# Patient Record
Sex: Female | Born: 1935 | Race: Black or African American | Hispanic: No | Marital: Married
Health system: Southern US, Community
[De-identification: ages and names within clinical notes are randomized; demographics above are authoritative.]

## PROBLEM LIST (undated history)

## (undated) DIAGNOSIS — I517 Cardiomegaly: Secondary | ICD-10-CM

## (undated) DIAGNOSIS — M5126 Other intervertebral disc displacement, lumbar region: Secondary | ICD-10-CM

## (undated) DIAGNOSIS — H547 Unspecified visual loss: Secondary | ICD-10-CM

## (undated) DIAGNOSIS — E785 Hyperlipidemia, unspecified: Secondary | ICD-10-CM

## (undated) DIAGNOSIS — G8194 Hemiplegia, unspecified affecting left nondominant side: Secondary | ICD-10-CM

## (undated) DIAGNOSIS — E46 Unspecified protein-calorie malnutrition: Secondary | ICD-10-CM

## (undated) DIAGNOSIS — I639 Cerebral infarction, unspecified: Secondary | ICD-10-CM

## (undated) DIAGNOSIS — K521 Toxic gastroenteritis and colitis: Secondary | ICD-10-CM

## (undated) DIAGNOSIS — N6019 Diffuse cystic mastopathy of unspecified breast: Secondary | ICD-10-CM

## (undated) DIAGNOSIS — J45909 Unspecified asthma, uncomplicated: Secondary | ICD-10-CM

## (undated) DIAGNOSIS — R5381 Other malaise: Secondary | ICD-10-CM

## (undated) DIAGNOSIS — T3695XA Adverse effect of unspecified systemic antibiotic, initial encounter: Secondary | ICD-10-CM

## (undated) DIAGNOSIS — R05 Cough: Secondary | ICD-10-CM

## (undated) DIAGNOSIS — R531 Weakness: Secondary | ICD-10-CM

## (undated) DIAGNOSIS — R059 Cough, unspecified: Secondary | ICD-10-CM

## (undated) DIAGNOSIS — I6381 Other cerebral infarction due to occlusion or stenosis of small artery: Secondary | ICD-10-CM

## (undated) DIAGNOSIS — I872 Venous insufficiency (chronic) (peripheral): Secondary | ICD-10-CM

## (undated) HISTORY — DX: Cardiomegaly: I51.7

## (undated) HISTORY — DX: Toxic gastroenteritis and colitis: K52.1

## (undated) HISTORY — PX: COLONOSCOPY: SHX174

## (undated) HISTORY — DX: Adverse effect of unspecified systemic antibiotic, initial encounter: T36.95XA

## (undated) HISTORY — DX: Unspecified asthma, uncomplicated: J45.909

## (undated) HISTORY — DX: Unspecified protein-calorie malnutrition: E46

## (undated) HISTORY — DX: Cerebral infarction, unspecified: I63.9

## (undated) HISTORY — DX: Venous insufficiency (chronic) (peripheral): I87.2

## (undated) HISTORY — DX: Hemiplegia, unspecified affecting left nondominant side: G81.94

## (undated) HISTORY — DX: Hyperlipidemia, unspecified: E78.5

## (undated) HISTORY — DX: Cough, unspecified: R05.9

## (undated) HISTORY — DX: Other malaise: R53.81

## (undated) HISTORY — DX: Diffuse cystic mastopathy of unspecified breast: N60.19

## (undated) HISTORY — DX: Other cerebral infarction due to occlusion or stenosis of small artery: I63.81

## (undated) HISTORY — DX: Unspecified visual loss: H54.7

## (undated) HISTORY — DX: Cough: R05

## (undated) HISTORY — DX: Other intervertebral disc displacement, lumbar region: M51.26

## (undated) HISTORY — DX: Weakness: R53.1

## (undated) HISTORY — PX: CATARACT EXTRACTION: SUR2

---

## 2004-02-06 ENCOUNTER — Encounter: Admission: RE | Admit: 2004-02-06 | Discharge: 2004-02-06 | Payer: Self-pay | Admitting: Family Medicine

## 2005-02-19 ENCOUNTER — Encounter: Admission: RE | Admit: 2005-02-19 | Discharge: 2005-02-19 | Payer: Self-pay | Admitting: Allergy and Immunology

## 2005-04-03 ENCOUNTER — Ambulatory Visit (HOSPITAL_COMMUNITY): Admission: RE | Admit: 2005-04-03 | Discharge: 2005-04-03 | Payer: Self-pay

## 2006-07-07 DIAGNOSIS — I517 Cardiomegaly: Secondary | ICD-10-CM

## 2006-07-07 HISTORY — DX: Cardiomegaly: I51.7

## 2006-07-17 ENCOUNTER — Other Ambulatory Visit: Admission: RE | Admit: 2006-07-17 | Discharge: 2006-07-17 | Payer: Self-pay | Admitting: Family Medicine

## 2006-07-30 ENCOUNTER — Encounter: Admission: RE | Admit: 2006-07-30 | Discharge: 2006-07-30 | Payer: Self-pay | Admitting: Family Medicine

## 2007-04-01 ENCOUNTER — Encounter: Admission: RE | Admit: 2007-04-01 | Discharge: 2007-04-01 | Payer: Self-pay | Admitting: Pediatrics

## 2008-11-18 ENCOUNTER — Inpatient Hospital Stay (HOSPITAL_COMMUNITY): Admission: AD | Admit: 2008-11-18 | Discharge: 2008-11-20 | Payer: Self-pay | Admitting: Internal Medicine

## 2008-11-18 ENCOUNTER — Ambulatory Visit: Payer: Self-pay | Admitting: Diagnostic Radiology

## 2008-11-18 ENCOUNTER — Encounter: Payer: Self-pay | Admitting: Emergency Medicine

## 2008-11-20 ENCOUNTER — Ambulatory Visit: Payer: Self-pay | Admitting: Vascular Surgery

## 2008-11-20 ENCOUNTER — Encounter (INDEPENDENT_AMBULATORY_CARE_PROVIDER_SITE_OTHER): Payer: Self-pay | Admitting: Internal Medicine

## 2009-03-06 ENCOUNTER — Encounter: Admission: RE | Admit: 2009-03-06 | Discharge: 2009-03-06 | Payer: Self-pay | Admitting: Family Medicine

## 2010-07-27 ENCOUNTER — Encounter: Payer: Self-pay | Admitting: Family Medicine

## 2010-10-15 LAB — CARDIAC PANEL(CRET KIN+CKTOT+MB+TROPI)
CK, MB: 2.3 ng/mL (ref 0.3–4.0)
CK, MB: 2.4 ng/mL (ref 0.3–4.0)
Relative Index: 1.8 (ref 0.0–2.5)
Relative Index: 1.7 (ref 0.0–2.5)
Total CK: 126 U/L (ref 7–177)
Total CK: 138 U/L (ref 7–177)
Troponin I: 0.01 ng/mL (ref 0.00–0.06)

## 2010-10-15 LAB — CBC
HCT: 41 % (ref 36.0–46.0)
Hemoglobin: 13 g/dL (ref 12.0–15.0)
Hemoglobin: 13.1 g/dL (ref 12.0–15.0)
MCHC: 32.9 g/dL (ref 30.0–36.0)
MCHC: 33.5 g/dL (ref 30.0–36.0)
MCHC: 34.4 g/dL (ref 30.0–36.0)
MCV: 88.9 fL (ref 78.0–100.0)
MCV: 89.8 fL (ref 78.0–100.0)
Platelets: 188 10*3/uL (ref 150–400)
Platelets: 199 10*3/uL (ref 150–400)
Platelets: 205 10*3/uL (ref 150–400)
Platelets: 197 10*3/uL (ref 150–400)
Platelets: 198 10*3/uL (ref 150–400)
RBC: 4.57 MIL/uL (ref 3.87–5.11)
RBC: 4.7 MIL/uL (ref 3.87–5.11)
RBC: 4.44 MIL/uL (ref 3.87–5.11)
RDW: 15.6 % — ABNORMAL HIGH (ref 11.5–15.5)
RDW: 14.3 % (ref 11.5–15.5)
WBC: 8.5 10*3/uL (ref 4.0–10.5)

## 2010-10-15 LAB — URINALYSIS, ROUTINE W REFLEX MICROSCOPIC
Glucose, UA: NEGATIVE mg/dL
Hgb urine dipstick: NEGATIVE
Protein, ur: NEGATIVE mg/dL
Specific Gravity, Urine: 1.013 (ref 1.005–1.030)

## 2010-10-15 LAB — BASIC METABOLIC PANEL
BUN: 7 mg/dL (ref 6–23)
CO2: 26 mEq/L (ref 19–32)
CO2: 22 mEq/L (ref 19–32)
Calcium: 8.2 mg/dL — ABNORMAL LOW (ref 8.4–10.5)
Calcium: 8.1 mg/dL — ABNORMAL LOW (ref 8.4–10.5)
Chloride: 106 mEq/L (ref 96–112)
Creatinine, Ser: 1.08 mg/dL (ref 0.4–1.2)
GFR calc Af Amer: 58 mL/min — ABNORMAL LOW (ref >60)
GFR calc Af Amer: >60 mL/min (ref >60)
Glucose, Bld: 109 mg/dL — ABNORMAL HIGH (ref 70–99)
Potassium: 4.6 mEq/L (ref 3.5–5.1)
Sodium: 134 mEq/L — ABNORMAL LOW (ref 135–145)

## 2010-10-15 LAB — COMPREHENSIVE METABOLIC PANEL
ALT: 14 U/L (ref 0–35)
ALT: 17 U/L (ref 0–35)
AST: 21 U/L (ref 0–37)
AST: 23 U/L (ref 0–37)
Albumin: 2.9 g/dL — ABNORMAL LOW (ref 3.5–5.2)
Albumin: 3.1 g/dL — ABNORMAL LOW (ref 3.5–5.2)
Alkaline Phosphatase: 57 U/L (ref 39–117)
Alkaline Phosphatase: 77 U/L (ref 39–117)
Calcium: 8.1 mg/dL — ABNORMAL LOW (ref 8.4–10.5)
GFR calc Af Amer: 57 mL/min — ABNORMAL LOW (ref >60)
GFR calc Af Amer: >60 mL/min (ref >60)
Glucose, Bld: 147 mg/dL — ABNORMAL HIGH (ref 70–99)
Potassium: 3.8 mEq/L (ref 3.5–5.1)
Potassium: 3.1 mEq/L — ABNORMAL LOW (ref 3.5–5.1)
Sodium: 140 mEq/L (ref 135–145)
Sodium: 142 mEq/L (ref 135–145)
Total Protein: 6 g/dL (ref 6.0–8.3)
Total Protein: 6.1 g/dL (ref 6.0–8.3)

## 2010-10-15 LAB — IRON AND TIBC
Iron: 40 ug/dL — ABNORMAL LOW (ref 42–135)
Saturation Ratios: 14 % — ABNORMAL LOW (ref 20–55)
TIBC: 278 ug/dL (ref 250–470)
UIBC: 238 ug/dL

## 2010-10-15 LAB — DIFFERENTIAL
Basophils Relative: 1 % (ref 0–1)
Eosinophils Absolute: 0.2 10*3/uL (ref 0.0–0.7)
Eosinophils Relative: 2 % (ref 0–5)
Lymphs Abs: 3.3 10*3/uL (ref 0.7–4.0)
Monocytes Absolute: 1 10*3/uL (ref 0.1–1.0)
Monocytes Relative: 11 % (ref 3–12)
Neutrophils Relative %: 49 % (ref 43–77)

## 2010-10-15 LAB — GLUCOSE, CAPILLARY: Glucose-Capillary: 157 mg/dL — ABNORMAL HIGH (ref 70–99)

## 2010-10-15 LAB — POCT CARDIAC MARKERS
CKMB, poc: <1 ng/mL — ABNORMAL LOW (ref 1.0–8.0)
Myoglobin, poc: 37.4 ng/mL (ref 12–200)
Myoglobin, poc: 49.2 ng/mL (ref 12–200)

## 2010-10-15 LAB — CULTURE, BLOOD (ROUTINE X 2)

## 2010-10-15 LAB — LIPID PANEL
Cholesterol: 151 mg/dL (ref 0–200)
LDL Cholesterol: 95 mg/dL (ref 0–99)
Total CHOL/HDL Ratio: 3.6 RATIO
Triglycerides: 71 mg/dL (ref <150)
VLDL: 14 mg/dL (ref 0–40)

## 2010-10-15 LAB — VITAMIN B12: Vitamin B-12: 599 pg/mL (ref 211–911)

## 2010-10-15 LAB — LACTIC ACID, PLASMA: Lactic Acid, Venous: 1.5 mmol/L (ref 0.5–2.2)

## 2010-10-15 LAB — FERRITIN: Ferritin: 70 ng/mL (ref 10–291)

## 2010-11-06 ENCOUNTER — Encounter: Payer: Self-pay | Admitting: Critical Care Medicine

## 2010-11-07 ENCOUNTER — Ambulatory Visit (INDEPENDENT_AMBULATORY_CARE_PROVIDER_SITE_OTHER): Payer: Medicare Other | Admitting: Critical Care Medicine

## 2010-11-07 ENCOUNTER — Ambulatory Visit (HOSPITAL_BASED_OUTPATIENT_CLINIC_OR_DEPARTMENT_OTHER)
Admission: RE | Admit: 2010-11-07 | Discharge: 2010-11-07 | Disposition: A | Discharge status: Home / Self Care | Payer: Medicare Other | Source: Ambulatory Visit | Attending: Critical Care Medicine | Admitting: Critical Care Medicine

## 2010-11-07 ENCOUNTER — Encounter: Payer: Self-pay | Admitting: Critical Care Medicine

## 2010-11-07 ENCOUNTER — Other Ambulatory Visit: Payer: Self-pay | Admitting: Critical Care Medicine

## 2010-11-07 VITALS — BP 120/70 | HR 62 | Temp 97.9°F | Ht 63.0 in | Wt 176.0 lb

## 2010-11-07 DIAGNOSIS — J45909 Unspecified asthma, uncomplicated: Secondary | ICD-10-CM

## 2010-11-07 DIAGNOSIS — M5126 Other intervertebral disc displacement, lumbar region: Secondary | ICD-10-CM

## 2010-11-07 DIAGNOSIS — I517 Cardiomegaly: Secondary | ICD-10-CM

## 2010-11-07 DIAGNOSIS — E785 Hyperlipidemia, unspecified: Secondary | ICD-10-CM

## 2010-11-07 DIAGNOSIS — K219 Gastro-esophageal reflux disease without esophagitis: Secondary | ICD-10-CM

## 2010-11-07 DIAGNOSIS — J4550 Severe persistent asthma, uncomplicated: Secondary | ICD-10-CM | POA: Insufficient documentation

## 2010-11-07 DIAGNOSIS — H547 Unspecified visual loss: Secondary | ICD-10-CM

## 2010-11-07 DIAGNOSIS — H543 Unqualified visual loss, both eyes: Secondary | ICD-10-CM

## 2010-11-07 DIAGNOSIS — I872 Venous insufficiency (chronic) (peripheral): Secondary | ICD-10-CM

## 2010-11-07 DIAGNOSIS — J309 Allergic rhinitis, unspecified: Secondary | ICD-10-CM | POA: Insufficient documentation

## 2010-11-07 MED ORDER — OMEPRAZOLE 20 MG PO CPDR: 20.0000 mg | DELAYED_RELEASE_CAPSULE | Freq: Every day | ORAL | Status: DC

## 2010-11-07 MED ORDER — BECLOMETHASONE DIPROPIONATE 80 MCG/ACT IN AERS: 2.0000 | INHALATION_SPRAY | Freq: Two times a day (BID) | RESPIRATORY_TRACT | Status: DC

## 2010-11-07 NOTE — Progress Notes (Signed)
Subjective:    Patient ID: Darlene Murray, female    DOB: 12/09/1921, 77 y.o.   MRN: [id number]  Asthma She complains of cough, difficulty breathing, shortness of breath, sputum production and wheezing. There is no chest tightness, frequent throat clearing or hoarse voice. This is a recurrent (Symptoms occur monthly.  On cyclic prednisone monthly.  Flares after weans off ) problem. The current episode started more than 1 year ago (current exacerbation over past one week.). The problem occurs daily (symptoms mainly at night when flares). The problem has been gradually worsening. The cough is productive of sputum (mucus is light yellow to white). Associated symptoms include dyspnea on exertion, orthopnea, postnasal drip and rhinorrhea. Pertinent negatives include no appetite change, chest pain, ear congestion, ear pain, fever, headaches, heartburn, myalgias, nasal congestion, PND, sneezing, sore throat, trouble swallowing or weight loss. Her symptoms are aggravated by pollen and URI. Her symptoms are alleviated by oral steroids and steroid inhaler. She reports moderate improvement on treatment. Risk factors for lung disease include no known risk factors. Her past medical history is significant for asthma. There is no history of bronchiectasis, bronchitis, COPD, emphysema or pneumonia.   77 y.o.AAF Dx asthma since 2007.  Symptoms have been excess mucus, cough and wheezing.   Past Medical History  Diagnosis Date  . Blindness   . Asthmatic bronchitis   . Asthma   . Fibrocystic breast disease   . Allergic rhinitis   . Hyperlipidemia   . Venous insufficiency     lower extremities  . HNP (herniated nucleus pulposus), lumbar     L4-L5  . Left ventricular hypertrophy 2008    mild     Family History  Problem Relation Age of Onset  . Heart attack Sister   . Alzheimer's disease Sister   . Leukemia Mother   . Asthma Son      History   Social History  . Marital Status: Widowed    Spouse  Name: N/A    Number of Children: 3  . Years of Education: N/A   Occupational History  . Retired     Runner, broadcasting/film/video   Social History Main Topics  . Smoking status: Never Smoker   . Smokeless tobacco: Never Used  . Alcohol Use: Yes     wine occasionally  . Drug Use: No  . Sexually Active: Not on file   Other Topics Concern  . Not on file   Social History Narrative  . No narrative on file     No Known Allergies   Outpatient Prescriptions Prior to Visit  Medication Sig Dispense Refill  . albuterol (PROAIR HFA) 108 (90 BASE) MCG/ACT inhaler Inhale 2 puffs into the lungs every 4 (four) hours as needed.        . beclomethasone (QVAR) 80 MCG/ACT inhaler Inhale 1 puff into the lungs 2 (two) times daily.        . montelukast (SINGULAIR) 10 MG tablet Take 10 mg by mouth at bedtime.        Marland Kitchen omeprazole (PRILOSEC) 20 MG capsule Take 20 mg by mouth daily as needed.       . traZODone (DESYREL) 50 MG tablet Take 50 mg by mouth at bedtime as needed.       . triamterene-hydrochlorothiazide (MAXZIDE-25) 37.5-25 MG per tablet 1 tablet in the morning as needed ankle swelling          Review of Systems  Constitutional: Negative for fever, chills, weight loss, diaphoresis, activity change, appetite change,  fatigue and unexpected weight change.  HENT: Positive for rhinorrhea, postnasal drip and sinus pressure. Negative for hearing loss, ear pain, nosebleeds, congestion, sore throat, hoarse voice, facial swelling, sneezing, mouth sores, trouble swallowing, neck pain, neck stiffness, dental problem, voice change, tinnitus and ear discharge.   Eyes: Positive for discharge. Negative for photophobia, itching and visual disturbance.  Respiratory: Positive for cough, sputum production, shortness of breath and wheezing. Negative for apnea, choking, chest tightness and stridor.   Cardiovascular: Positive for dyspnea on exertion. Negative for chest pain, palpitations, leg swelling and PND.  Gastrointestinal:  Negative for heartburn, nausea, vomiting, abdominal pain, constipation, blood in stool and abdominal distention.  Genitourinary: Negative for dysuria, urgency, frequency, hematuria, flank pain, decreased urine volume and difficulty urinating.  Musculoskeletal: Positive for joint swelling. Negative for myalgias, back pain, arthralgias and gait problem.  Skin: Negative for color change, pallor and rash.  Neurological: Negative for dizziness, tremors, seizures, syncope, speech difficulty, weakness, light-headedness, numbness and headaches.  Hematological: Negative for adenopathy. Does not bruise/bleed easily.  Psychiatric/Behavioral: Negative for confusion, sleep disturbance and agitation. The patient is not nervous/anxious.        Objective:   Physical Exam Filed Vitals:   11/07/10 1055  BP: 120/70  Pulse: 62  Temp: 97.9 F (36.6 C)  TempSrc: Oral  Height: 5\' 3"  (1.6 m)  Weight: 176 lb (79.833 kg)  SpO2: 97%    Gen: Pleasant, well-nourished, in no distress,  normal affect  ENT: No lesions,  mouth clear,  oropharynx clear, no postnasal drip  Neck: No JVD, no TMG, no carotid bruits  Lungs: No use of accessory muscles, no dullness to percussion, distant S , poor airflow  Cardiovascular: RRR, heart sounds normal, no murmur or gallops, no peripheral edema  Abdomen: soft and NT, no HSM,  BS normal  Musculoskeletal: No deformities, no cyanosis or clubbing  Neuro: alert, non focal  Skin: Warm, no lesions or rashes     CXR 11/07/10: IMPRESSION: Minimal hyperinflation configuration. No superimposed pneumonia, pulmonary edema, or pleural effusion is seen. No pneumothorax is evident.     Assessment & Plan:   Asthma Moderate persistent asthma,  Steroid dependent, atopic, GERD as ppt factors.  Note HFA technique only at 30% Note CXR normal 5/12 Plan  Use omeprazole daily, take 1/2 hour before breakfast then eat Stop Singulair Follow reflux diet Allergy labs today>>>RAST  ,IgE Increase Qvar to two puff twice daily, work on technique A full pulmonary function study  Return 1 month, I will call with lab/xray results     Updated Medication List Outpatient Encounter Prescriptions as of 11/07/2010  Medication Sig Dispense Refill  . albuterol (PROAIR HFA) 108 (90 BASE) MCG/ACT inhaler Inhale 2 puffs into the lungs every 4 (four) hours as needed.        . beclomethasone (QVAR) 80 MCG/ACT inhaler Inhale 2 puffs into the lungs 2 (two) times daily.  1 Inhaler  6  . omeprazole (PRILOSEC) 20 MG capsule Take 1 capsule (20 mg total) by mouth daily.  30 capsule  6  . predniSONE (DELTASONE) 10 MG tablet Take 20 mg by mouth daily. Tapering by Dr. Clarene Duke - has 2 days left       . traZODone (DESYREL) 50 MG tablet Take 50 mg by mouth at bedtime as needed.       . triamterene-hydrochlorothiazide (MAXZIDE-25) 37.5-25 MG per tablet 1 tablet in the morning as needed ankle swelling       . DISCONTD: beclomethasone (QVAR) 80  MCG/ACT inhaler Inhale 1 puff into the lungs 2 (two) times daily.        Marland Kitchen DISCONTD: montelukast (SINGULAIR) 10 MG tablet Take 10 mg by mouth at bedtime.        Marland Kitchen DISCONTD: omeprazole (PRILOSEC) 20 MG capsule Take 20 mg by mouth daily as needed.

## 2010-11-07 NOTE — Progress Notes (Signed)
Quick Note:  Called, spoke with pt. She was informed of CXR results and recs per PW. She verbalized understanding of this and voiced no further questions at this time. ______

## 2010-11-07 NOTE — Progress Notes (Signed)
Quick Note:  Notify the patient that the Xray is stable and no pneumonia, it does show signs of asthma No change in medications are recommended. Continue current meds as prescribed at last office visit ______

## 2010-11-07 NOTE — Patient Instructions (Signed)
Use omeprazole daily, take 1/2 hour before breakfast then eat Stop Singulair Follow reflux diet Allergy labs today Increase Qvar to two puff twice daily, work on technique Chest xray today A full pulmonary function study will be obtained in main office in [place] Return 1 month, I will call with lab/xray results

## 2010-11-08 LAB — ALLERGY FULL PROFILE
Allergen, D pternoyssinus,d7: <0.35 kU/L (ref <0.35)
Aspergillus fumigatus, IgG: <0.35 kU/L (ref <0.35)
Bahia Grass: <0.35 kU/L (ref <0.35)
Bermuda Grass: <0.35 kU/L (ref <0.35)
Candida Albicans: <0.35 kU/L (ref <0.35)
D. farinae: <0.35 kU/L (ref <0.35)
Elm IgE: <0.35 kU/L (ref <0.35)
G009 Red Top: <0.35 kU/L (ref <0.35)
Oak: <0.35 kU/L (ref <0.35)
Plantain: <0.35 kU/L (ref <0.35)
Sycamore Tree: <0.35 kU/L (ref <0.35)
Timothy Grass: <0.35 kU/L (ref <0.35)

## 2010-11-09 NOTE — Assessment & Plan Note (Addendum)
Moderate persistent asthma,  Steroid dependent, atopic, GERD as ppt factors.  Note HFA technique only at 30% Note CXR normal 5/12 Plan  Use omeprazole daily, take 1/2 hour before breakfast then eat Stop Singulair Follow reflux diet Allergy labs today>>>RAST ,IgE Increase Qvar to two puff twice daily, work on technique A full pulmonary function study  Return 1 month, I will call with lab/xray results

## 2010-11-14 ENCOUNTER — Telehealth: Payer: Self-pay | Admitting: *Deleted

## 2010-11-14 NOTE — Telephone Encounter (Signed)
Received allergy profile done at last ov.  Per Dr. Delford Field, call pt and inform her she does have global allergies but no specific allergies.  Stay on meds as prescribed at last OV and he will discuss this in detail with her at next ov.  Called, spoke with pt.  She was informed of above results and recs per PW.  She verbalized understanding understanding of this and voiced no further questions at this time.

## 2010-11-19 NOTE — H&P (Signed)
Darlene Murray, Darlene Murray        ACCOUNT NO.:  [id number]   MEDICAL RECORD NO.:  [id number]          PATIENT TYPE:  INP   LOCATION:  1231                         [facility]   PHYSICIAN:  Michiel Cowboy, MDDATE OF BIRTH:  1921/12/15   DATE OF ADMISSION:  11/18/2008  DATE OF DISCHARGE:                              HISTORY & PHYSICAL   PRIMARY CARE PHYSICIAN:  Chales Salmon. Abigail Miyamoto, M.D.   CHIEF COMPLAINT:  Presyncope, nausea, vomiting, diarrhea.   Patient is a pleasant 77 year old female with no significant past  medical history except for asthma which is under good control.  She had  a very stressful day yesterday, been outside in the sun all day long,  had poor p.o. intake.  No complaints of chest pain or shortness of  breath.  She went to bed and felt fine this morning.  She woke up and  felt generalized weakness which was severe then developed nausea,  vomiting and diarrhea x1 episode.  She became diaphoretic, although not  febrile.  Still had not any chest pain, shortness of breath.  She felt  dizzy-headed but she cannot tell me if it was the room spinning or she  felt presyncopal.  The patient never lost consciousness.  EMS was  called.  She was brought into the emergency department where her initial  temperature was noted to be 94.1.  She was put on a bear hugger.  Initial blood pressure was up to 163/79.  As time progressed, it started  to drift down to 96/58.  The patient was given IV fluids and blood  pressure came back up to 120s systolic.  She was presumed to have sepsis  of unknown etiology, although chest x-ray was unremarkable as well as  her UA.  She was started on IV Zosyn and transferred to [facility] facility,  [facility] step-down from [facility] ER.  Per family, she never had  any complaints of chest pain, shortness of breath.  The patient also  denies that.  Had no fevers, no chills, was feeling completely fine up  until this morning.  No localized  weakness was noted.   PAST MEDICAL HISTORY:  Only significant for asthma.   SOCIAL HISTORY:  The patient never smoked, does not drink, does not  abuse drugs.  Lives at home with husband.   FAMILY HISTORY:  Noncontributory.   ALLERGIES:  No known drug allergies.   MEDICATIONS:  Albuterol as needed.   PHYSICAL EXAMINATION:  VITALS:  Upon presentation to the ER, blood  pressure was 163/79, pulse 55, respirations 15, saturations 99% on room  air, temperature 94.5.  The patient's current vitals are temperature of  97.5, heart rate 55, respirations 14, saturations 100% 2 liters, blood  pressure on admission 133/89, now down slightly to 105/57.  GENERAL:  The patient is still feeling poorly, although cannot tell me  exactly what bothers her.  The patient appears to be younger than stated  age.  HEENT:  Head nontraumatic.  Dryish mucous membranes.  SKIN:  Fair skin turgor.  Clean, dry and intact.  LUNGS:  Clear to auscultation bilaterally.  No wheezes or  rhonchi  appreciated.  HEART:  Regular rate and rhythm although somewhat slow.  No murmurs  could be appreciated.  ABDOMEN:  Soft, nontender, nondistended.  LOWER EXTREMITIES:  Without clubbing, cyanosis or edema.  NEUROLOGIC:  The patient neurologically appears to be grossly intact.  Moving all 4 extremities.  The patient is too weak to stand up and walk,  which is not her baseline.   LABORATORY:  White blood cell count 8.7, hemoglobin 13.1, sodium 142,  potassium 3.1, creatinine 0.9.  UA negative.  Hemoccult positive.  Chest  x-ray showing low lung volume.  CT scan of the head is no acute changes.  EKG showing left anterior fascicular block, otherwise unremarkable.  No  evidence of ischemia.   ASSESSMENT/PLAN:  1. Hypotension, presyncope.  Wonder if this is secondary to      dehydration and poor p.o. intake yesterday, nausea, vomiting,      diarrhea today.  Other possibilities could be cardiogenic shock.      We will cycle  cardiac enzymes x3, obtain serial EKGs, obtain 2-D      echo.  No evidence of infection at this point but until sepsis      completely ruled out, we will continue IV antibiotics.  Check TSH      level given hypothermia.  Currently, this has resolved.  We will      send D-dimer.  2. Presyncope likely secondary to dehydration but will obtain 2-D echo      and carotid Dopplers given I am not sure if she is having vertigo      or presyncope and had an acute onset of this.  We will obtain MRI      to rule out cerebrovascular accident although this is less likely.  3. Hemoccult-positive stools.  We will record number and color of      stools in the chart, sent stool studies.  She has no exposure to      antibiotics or hospital system.  We will hold off C. diff right now      as has only had one episode of diarrhea which was partly related to      transient hypotension maybe.  We will continue to watch CBC.  4. Low potassium, replaced.  5. Hypothermia, currently resolved.  Etiology not quite clear at this      point.  6. History of asthma.  Continue albuterol as needed.  Currently does      not appear to be in asthmatic exacerbation.  7. Prophylaxis:  Protonix plus SCDs, avoid Lovenox for now as she was      Hemoccult-positive.   CODE STATUS:  Per the patient's wishes as well as her family wishes, she  wished to be DNR/DNI which was put in place.   DISPOSITION:  For now until completely sure the patient is not floridly  septic, we will keep in step-down.  If she does well, likely will  transfer to telemetry.      Michiel Cowboy, MD  Electronically Signed     AVD/MEDQ  D:  11/18/2008  T:  11/18/2008  Job:  [id number]   cc:   Chales Salmon. Abigail Miyamoto, M.D.  Fax: [phone]

## 2010-11-19 NOTE — Discharge Summary (Signed)
Darlene Murray, Darlene Murray        ACCOUNT NO.:  [id number]   MEDICAL RECORD NO.:  [id number]          PATIENT TYPE:  INP   LOCATION:  1415                         FACILITY[facility]   PHYSICIAN:  Michiel Cowboy, MDDATE OF BIRTH:  20-Jan-1922   DATE OF ADMISSION:  11/18/2008  DATE OF DISCHARGE:  11/20/2008                               DISCHARGE SUMMARY   PRIMARY CARE Takahiro Godinho:  Dr. Abigail Miyamoto.   CARDIOLOGIST:  Dr. Verdis Prime.   DISCHARGE DIAGNOSES:  Include:  1. Transient hypertension secondary to mild dehydration, currently      resolved.  2. Gastroesophageal reflux disease.  3. History of asthma.  4. Presyncope, resolved.   STUDIES:  Involved:  1. Chest x-ray on Nov 18, 2008, showing low lung volume without focal      disease.  2. CT scan of the head showing no acute intracranial abnormalities,      numerous punctate, hyperdensities within patient's scalp, may be      debris.  3. MRI of the brain showing no acute infarct, atrophy, mild small      vessel disease-type changes suggesting for atherosclerotic-type      changes involving vertebral artery, small, notable3 on the left.  4. CT angiogram of chest done on Nov 19, 2008, showing acute pulmonary      thromboembolism, mild basal atelectasis with multiple bilateral      pulmonary nodules, none above 1 cm, needs to have a followup CT      image in 3 months.  5. Chest x-ray repeat on Nov 19, 2008, showing left basal subsegmental      atelectasis, otherwise unremarkable.  6. A 2D echo done on Nov 20, 2008, showing normal systolic function      and there is a very mild diastolic abnormality but otherwise      unremarkable.  Mild regurgitation noted on aortic valve.  7. Carotid Dopplers done on Nov 20, 2008, preliminary studies found no      ICA stenosis bilaterally.   HOSPITAL COURSE:  Patient is a pleasant 77 year old female who was  admitted with presyncope and transient hypertension, as well as episode  of diarrhea  after she had a very hard day, most of which was spent in  the sun.  Patient initially was admitted to step down because of  transient hypertension but quickly recovered.  Her blood pressure  normalized with mild IV hydration.  She was feeling much better and by  the time of discharge was back to her baseline.  She had PT/OT  evaluation who recommended  home health followup with PT and a rolling  walker.  Patient has been fairly active at home.  She did have an above-  mentioned workup and a CT scan given elevated D-dimer which was negative  for PE but did show pulmonary nodules which need to be followed up in 3  years.  Patient does not have a history of known cancer.   Her extensive workup was unremarkable.  Most likely the cause for her  presyncope was mild dehydration from her being out in the sun for a  prolonged period of time.  Patient needs to follow up with her primary  care Marquia Costello, as well as with Cardiology as needed.  Of note, her EKG  showed some left fascicular block, her heart rate was in the 50s to 60s.  I would recommend outpatient followup with Dr. Katrinka Blazing and Dr. Abigail Miyamoto.      Michiel Cowboy, MD  Electronically Signed     AVD/MEDQ  D:  11/20/2008  T:  11/20/2008  Job:  [id number]   cc:   Chales Salmon. Abigail Miyamoto, M.D.  Fax: [phone]   Lyn Records, M.D.  Fax: [phone]

## 2010-11-22 ENCOUNTER — Ambulatory Visit (INDEPENDENT_AMBULATORY_CARE_PROVIDER_SITE_OTHER): Payer: Medicare Other | Admitting: Critical Care Medicine

## 2010-11-22 DIAGNOSIS — J45909 Unspecified asthma, uncomplicated: Secondary | ICD-10-CM

## 2010-11-22 LAB — PULMONARY FUNCTION TEST

## 2010-11-22 NOTE — Progress Notes (Signed)
PFT done today. 

## 2010-11-27 ENCOUNTER — Encounter: Payer: Self-pay | Admitting: Critical Care Medicine

## 2010-12-04 ENCOUNTER — Encounter: Payer: Self-pay | Admitting: Critical Care Medicine

## 2010-12-04 ENCOUNTER — Telehealth: Payer: Self-pay | Admitting: Critical Care Medicine

## 2010-12-04 MED ORDER — CEFDINIR 300 MG PO CAPS: 300.0000 mg | ORAL_CAPSULE | Freq: Two times a day (BID) | ORAL | Status: AC

## 2010-12-04 NOTE — Telephone Encounter (Signed)
Call in cefdinir 600mg  daily for 6 days OV if unimproving

## 2010-12-04 NOTE — Telephone Encounter (Signed)
PT CALLED BACK AGAIN. WANTS A RESPONSE TODAY ASAP.

## 2010-12-04 NOTE — Telephone Encounter (Signed)
Spoke with the patient and she is c/o increased SOB, chest congestion, productive cough with yellow phlegm. And chills x 1 day. She denies chest tightness, fever, or head congestion. Pt has not tried anything OTC. Offered the pt an appt but she states she can not make it in and prefers something be called in. Pt last seen on 11-07-10.   Please advise. Carron Curie, CMA No Known Allergies

## 2010-12-04 NOTE — Telephone Encounter (Signed)
Pt aware. rx sent.  Weber Monnier, CMA  

## 2010-12-07 ENCOUNTER — Emergency Department (HOSPITAL_BASED_OUTPATIENT_CLINIC_OR_DEPARTMENT_OTHER)
Admission: EM | Admit: 2010-12-07 | Discharge: 2010-12-07 | Disposition: A | Discharge status: Home / Self Care | Payer: Medicare Other | Attending: Emergency Medicine | Admitting: Emergency Medicine

## 2010-12-07 ENCOUNTER — Emergency Department (INDEPENDENT_AMBULATORY_CARE_PROVIDER_SITE_OTHER): Payer: Medicare Other

## 2010-12-07 DIAGNOSIS — R0602 Shortness of breath: Secondary | ICD-10-CM | POA: Insufficient documentation

## 2010-12-07 DIAGNOSIS — Z79899 Other long term (current) drug therapy: Secondary | ICD-10-CM | POA: Insufficient documentation

## 2010-12-07 DIAGNOSIS — J45909 Unspecified asthma, uncomplicated: Secondary | ICD-10-CM

## 2010-12-07 DIAGNOSIS — J45901 Unspecified asthma with (acute) exacerbation: Secondary | ICD-10-CM | POA: Insufficient documentation

## 2010-12-10 ENCOUNTER — Encounter: Payer: Self-pay | Admitting: Pulmonary Disease

## 2010-12-10 ENCOUNTER — Telehealth: Payer: Self-pay | Admitting: *Deleted

## 2010-12-10 ENCOUNTER — Ambulatory Visit (INDEPENDENT_AMBULATORY_CARE_PROVIDER_SITE_OTHER): Payer: Medicare Other | Admitting: Pulmonary Disease

## 2010-12-10 VITALS — BP 122/62 | HR 75 | Temp 97.7°F | Ht 63.0 in | Wt 175.0 lb

## 2010-12-10 DIAGNOSIS — J45909 Unspecified asthma, uncomplicated: Secondary | ICD-10-CM

## 2010-12-10 MED ORDER — PREDNISONE (PAK) 10 MG PO TABS: 10.0000 mg | ORAL_TABLET | Freq: Every day | ORAL | Status: AC

## 2010-12-10 MED ORDER — AZITHROMYCIN 500 MG PO TABS: ORAL_TABLET | ORAL | Status: AC

## 2010-12-10 NOTE — Progress Notes (Signed)
  Subjective:    Patient ID: Darlene Murray, female    DOB: June 15, 1922, 77 y.o.   MRN: [id number]  HPI 88/F with Moderate persistent asthma, Steroid dependent, atopic, GERD as ppt factors. Note HFA technique only at 30%  Note CXR normal 5/12  On Qvar & SABA, RAST ok. She does have nebuliser at home.  12/10/2010  Post ER visit ER visit for congestion, dyspnea & chest pain >> CXR interstitial thickening Given pred 40 mg x 5ds Took cefdinir x 5 days >> diarrhea, hence stopped.     Review of Systems Pt denies any significant  nasal congestion or excess secretions, fever, chills, sweats, unintended wt loss, pleuritic or exertional cp, orthopnea pnd or leg swelling.  Pt also denies any obvious fluctuation in symptoms with weather or environmental change or other alleviating or aggravating factors.    Pt denies any increase in rescue therapy over baseline, denies waking up needing it or having early am exacerbations or coughing/wheezing/ or dyspnea       Objective:   Physical Exam Gen. Pleasant, well-nourished, in no distress ENT - no lesions, no post nasal drip Neck: No JVD, no thyromegaly, no carotid bruits Lungs: no use of accessory muscles, no dullness to percussion, clear without rales or rhonchi  Cardiovascular: Rhythm regular, heart sounds  normal, no murmurs or gallops, no peripheral edema Musculoskeletal: No deformities, no cyanosis or clubbing          Assessment & Plan:

## 2010-12-10 NOTE — Telephone Encounter (Signed)
Spoke with pharmacy and advised per Dr. Vassie Loll 10mg  6 day dose.

## 2010-12-10 NOTE — Assessment & Plan Note (Signed)
Episode of asthmatic bronchitis requiring ER visit. Pred dosepak  Z-pak for atypicals Call if no better, reviewed CXR

## 2010-12-10 NOTE — Telephone Encounter (Signed)
Received message from [place] at [facility] requesting clarification of directions for pt's Prednisone rx. They would like to know if it is for a decreasing 6 day pack? Please advise of directions.

## 2010-12-10 NOTE — Patient Instructions (Signed)
Z-pak (antibiotic) x 5 days Prednisone dosepak -60 mg -taper as directed Call if no better by Friday

## 2010-12-23 ENCOUNTER — Ambulatory Visit (INDEPENDENT_AMBULATORY_CARE_PROVIDER_SITE_OTHER): Payer: Medicare Other | Admitting: Critical Care Medicine

## 2010-12-23 ENCOUNTER — Encounter: Payer: Self-pay | Admitting: Critical Care Medicine

## 2010-12-23 DIAGNOSIS — T3695XA Adverse effect of unspecified systemic antibiotic, initial encounter: Secondary | ICD-10-CM

## 2010-12-23 DIAGNOSIS — R197 Diarrhea, unspecified: Secondary | ICD-10-CM

## 2010-12-23 DIAGNOSIS — J45909 Unspecified asthma, uncomplicated: Secondary | ICD-10-CM

## 2010-12-23 DIAGNOSIS — K521 Toxic gastroenteritis and colitis: Secondary | ICD-10-CM

## 2010-12-23 NOTE — Progress Notes (Signed)
Subjective:    Patient ID: Darlene Murray, female    DOB: July 30, 1921, 77 y.o.   MRN: [id number]  HPI  77 y.o. AAF with Moderate persistent asthma, Steroid dependent, atopic, GERD as ppt factors. Note HFA technique only at 30%  Note CXR normal 5/12  On Qvar & SABA, RAST ok. She does have nebuliser at home.  12/10/10  Post ER visit ER visit for congestion, dyspnea & chest pain >> CXR interstitial thickening Given pred 40 mg x 5ds Took cefdinir x 5 days >> diarrhea, hence stopped.  12/23/2010 Saw RA and rx zpak after omniceph and pt got diarrhea.  HAd gone to ED.  No diarrhea now. No real cough.  Now no dyspnea.  No rescue inhaler use .   Past Medical History  Diagnosis Date  . Blindness   . Asthmatic bronchitis   . Asthma   . Fibrocystic breast disease   . Allergic rhinitis   . Hyperlipidemia   . Venous insufficiency     lower extremities  . HNP (herniated nucleus pulposus), lumbar     L4-L5  . Left ventricular hypertrophy 2008    mild  . Antibiotic-associated diarrhea      Family History  Problem Relation Age of Onset  . Heart attack Sister   . Alzheimer's disease Sister   . Leukemia Mother   . Asthma Son      History   Social History  . Marital Status: Married    Spouse Name: N/A    Number of Children: 3  . Years of Education: N/A   Occupational History  . Retired     Runner, broadcasting/film/video   Social History Main Topics  . Smoking status: Never Smoker   . Smokeless tobacco: Never Used  . Alcohol Use: Yes     wine occasionally  . Drug Use: No  . Sexually Active: Not on file   Other Topics Concern  . Not on file   Social History Narrative  . No narrative on file     No Known Allergies   Outpatient Prescriptions Prior to Visit  Medication Sig Dispense Refill  . albuterol (PROAIR HFA) 108 (90 BASE) MCG/ACT inhaler Inhale 2 puffs into the lungs every 4 (four) hours as needed.        Marland Kitchen azelastine (ASTEPRO) 137 MCG/SPRAY nasal spray Place 1 spray into the nose 2 (two)  times daily as needed. Use in each nostril as directed       . beclomethasone (QVAR) 80 MCG/ACT inhaler Inhale 2 puffs into the lungs 2 (two) times daily.  1 Inhaler  6  . calcium carbonate (OS-CAL) 600 MG TABS Take 600 mg by mouth daily.        . Cholecalciferol (VITAMIN D3) 1000 UNITS CAPS Take 1 capsule by mouth daily.        . Guaifenesin (MUCINEX MAXIMUM STRENGTH) 1200 MG TB12 Take 1 tablet by mouth 2 (two) times daily.        . Multiple Vitamin (MULTIVITAMIN) capsule Take 1 capsule by mouth daily.        Marland Kitchen omeprazole (PRILOSEC) 20 MG capsule Take 1 capsule (20 mg total) by mouth daily.  30 capsule  6  . traZODone (DESYREL) 50 MG tablet Take 50 mg by mouth at bedtime as needed.       . triamterene-hydrochlorothiazide (MAXZIDE-25) 37.5-25 MG per tablet 1 tablet in the morning as needed ankle swelling       . predniSONE (DELTASONE) 10 MG tablet Take 20  mg by mouth daily. Tapering by Dr. Clarene Duke - has 2 days left            Review of Systems  Pt denies any significant  nasal congestion or excess secretions, fever, chills, sweats, unintended wt loss, pleuritic or exertional cp, orthopnea pnd or leg swelling.  Pt also denies any obvious fluctuation in symptoms with weather or environmental change or other alleviating or aggravating factors.    Pt denies any increase in rescue therapy over baseline, denies waking up needing it or having early am exacerbations or coughing/wheezing/ or dyspnea       Objective:   Physical Exam  Gen. Pleasant, well-nourished, in no distress ENT - no lesions, no post nasal drip Neck: No JVD, no thyromegaly, no carotid bruits Lungs: no use of accessory muscles, no dullness to percussion, clear without rales or rhonchi  Cardiovascular: Rhythm regular, heart sounds  normal, no murmurs or gallops, no peripheral edema Musculoskeletal: No deformities, no cyanosis or clubbing          Assessment & Plan:   Asthma with allergic rhinitis Moderate persistent  asthma HFA technique only 30% up to 50% after retraining 5/12>>>only 50% with more training 12/23/10 PFTs 5/18: FeV1 94%  Fef 25 75 55%   >>>66% post BD Recent exacerbation in part due to poor HFA technique. Pt has aerochamber and will use with HFA device Plan Use aerochamber No further ABX No further pred No change in inhaled or maintenance medications. Return in  2 months     Updated Medication List Outpatient Encounter Prescriptions as of 12/23/2010  Medication Sig Dispense Refill  . albuterol (PROAIR HFA) 108 (90 BASE) MCG/ACT inhaler Inhale 2 puffs into the lungs every 4 (four) hours as needed.        Marland Kitchen azelastine (ASTEPRO) 137 MCG/SPRAY nasal spray Place 1 spray into the nose 2 (two) times daily as needed. Use in each nostril as directed       . beclomethasone (QVAR) 80 MCG/ACT inhaler Inhale 2 puffs into the lungs 2 (two) times daily.  1 Inhaler  6  . calcium carbonate (OS-CAL) 600 MG TABS Take 600 mg by mouth daily.        . Cholecalciferol (VITAMIN D3) 1000 UNITS CAPS Take 1 capsule by mouth daily.        . Guaifenesin (MUCINEX MAXIMUM STRENGTH) 1200 MG TB12 Take 1 tablet by mouth 2 (two) times daily.        . Multiple Vitamin (MULTIVITAMIN) capsule Take 1 capsule by mouth daily.        Marland Kitchen omeprazole (PRILOSEC) 20 MG capsule Take 1 capsule (20 mg total) by mouth daily.  30 capsule  6  . traZODone (DESYREL) 50 MG tablet Take 50 mg by mouth at bedtime as needed.       . triamterene-hydrochlorothiazide (MAXZIDE-25) 37.5-25 MG per tablet 1 tablet in the morning as needed ankle swelling       . DISCONTD: predniSONE (DELTASONE) 10 MG tablet Take 20 mg by mouth daily. Tapering by Dr. Clarene Duke - has 2 days left

## 2010-12-23 NOTE — Assessment & Plan Note (Signed)
Moderate persistent asthma HFA technique only 30% up to 50% after retraining 5/12>>>only 50% with more training 12/23/10 PFTs 5/18: FeV1 94%  Fef 25 75 55%   >>>66% post BD Recent exacerbation in part due to poor HFA technique. Pt has aerochamber and will use with HFA device Plan Use aerochamber No further ABX No further pred No change in inhaled or maintenance medications. Return in  2 months

## 2010-12-23 NOTE — Patient Instructions (Signed)
No change in medications. Return in        2 months [place] Work on your inhaler technique and use Aerochamber with Qvar and Proventil

## 2010-12-26 ENCOUNTER — Ambulatory Visit: Payer: PRIVATE HEALTH INSURANCE | Admitting: Critical Care Medicine

## 2011-01-02 ENCOUNTER — Ambulatory Visit: Payer: PRIVATE HEALTH INSURANCE | Admitting: Critical Care Medicine

## 2011-01-27 ENCOUNTER — Other Ambulatory Visit: Payer: Self-pay | Admitting: *Deleted

## 2011-01-27 DIAGNOSIS — K219 Gastro-esophageal reflux disease without esophagitis: Secondary | ICD-10-CM

## 2011-01-27 MED ORDER — OMEPRAZOLE 20 MG PO CPDR: 20.0000 mg | DELAYED_RELEASE_CAPSULE | Freq: Every day | ORAL | Status: DC

## 2011-02-13 ENCOUNTER — Ambulatory Visit: Payer: Medicare Other | Admitting: Critical Care Medicine

## 2011-02-13 ENCOUNTER — Encounter: Payer: Self-pay | Admitting: Critical Care Medicine

## 2011-02-13 ENCOUNTER — Ambulatory Visit (INDEPENDENT_AMBULATORY_CARE_PROVIDER_SITE_OTHER): Payer: Medicare Other | Admitting: Critical Care Medicine

## 2011-02-13 DIAGNOSIS — J45909 Unspecified asthma, uncomplicated: Secondary | ICD-10-CM

## 2011-02-13 MED ORDER — BUDESONIDE-FORMOTEROL FUMARATE 160-4.5 MCG/ACT IN AERO: 2.0000 | INHALATION_SPRAY | Freq: Two times a day (BID) | RESPIRATORY_TRACT | Status: DC

## 2011-02-13 NOTE — Patient Instructions (Signed)
Increase symbicort to two puff twice daily No other medication changes Return 3 months, call if you worsen sooner

## 2011-02-13 NOTE — Progress Notes (Signed)
Subjective:    Patient ID: Darlene Murray, female    DOB: [birth year], 77 y.o.   MRN: [id number]  HPI  77 y.o. AAF with Moderate persistent asthma, Steroid dependent, atopic, GERD as ppt factors. Note HFA technique only at 30%  Note CXR normal 5/12  On Qvar & SABA, RAST ok. She does have nebuliser at home.  12/10/10  Post ER visit ER visit for congestion, dyspnea & chest pain >> CXR interstitial thickening Given pred 40 mg x 5ds Took cefdinir x 5 days >> diarrhea, hence stopped.  12/23/10 Saw RA and rx zpak after omniceph and pt got diarrhea.  HAd gone to ED.  No diarrhea now. No real cough.  Now no dyspnea.  No rescue inhaler use .  8/9 Last few weeks dyspnea.  Now on symbicort since 01/22/11 No cough now,  No saba use.   No sinus issues.  Past Medical History  Diagnosis Date  . Blindness   . Asthmatic bronchitis   . Asthma   . Fibrocystic breast disease   . Allergic rhinitis   . Hyperlipidemia   . Venous insufficiency     lower extremities  . HNP (herniated nucleus pulposus), lumbar     L4-L5  . Left ventricular hypertrophy 2008    mild  . Antibiotic-associated diarrhea      Family History  Problem Relation Age of Onset  . Heart attack Sister   . Alzheimer's disease Sister   . Leukemia Mother   . Asthma Son      History   Social History  . Marital Status: Married    Spouse Name: N/A    Number of Children: 3  . Years of Education: N/A   Occupational History  . Retired     Runner, broadcasting/film/video   Social History Main Topics  . Smoking status: Never Smoker   . Smokeless tobacco: Never Used  . Alcohol Use: Yes     wine occasionally  . Drug Use: No  . Sexually Active: Not on file   Other Topics Concern  . Not on file   Social History Narrative  . No narrative on file     No Known Allergies   Outpatient Prescriptions Prior to Visit  Medication Sig Dispense Refill  . albuterol (PROAIR HFA) 108 (90 BASE) MCG/ACT inhaler Inhale 2 puffs into the lungs every 4 (four)  hours as needed.        . calcium carbonate (OS-CAL) 600 MG TABS Take 600 mg by mouth daily.        . Cholecalciferol (VITAMIN D3) 1000 UNITS CAPS Take 1 capsule by mouth daily.        . Multiple Vitamin (MULTIVITAMIN) capsule Take 1 capsule by mouth daily.        Marland Kitchen omeprazole (PRILOSEC) 20 MG capsule Take 1 capsule (20 mg total) by mouth daily.  90 capsule  3  . traZODone (DESYREL) 50 MG tablet Take 50 mg by mouth at bedtime as needed.       . triamterene-hydrochlorothiazide (MAXZIDE-25) 37.5-25 MG per tablet 1 tablet in the morning as needed ankle swelling       . azelastine (ASTEPRO) 137 MCG/SPRAY nasal spray Place 1 spray into the nose 2 (two) times daily as needed. Use in each nostril as directed       . beclomethasone (QVAR) 80 MCG/ACT inhaler Inhale 2 puffs into the lungs 2 (two) times daily.  1 Inhaler  6  . Guaifenesin (MUCINEX MAXIMUM STRENGTH) 1200 MG TB12 Take  1 tablet by mouth 2 (two) times daily.             Review of Systems  Pt denies any significant  nasal congestion or excess secretions, fever, chills, sweats, unintended wt loss, pleuritic or exertional cp, orthopnea pnd or leg swelling.  Pt also denies any obvious fluctuation in symptoms with weather or environmental change or other alleviating or aggravating factors.    Pt denies any increase in rescue therapy over baseline, denies waking up needing it or having early am exacerbations or coughing/wheezing/ or dyspnea       Objective:   Physical Exam  Gen. Pleasant, well-nourished, in no distress ENT - no lesions, no post nasal drip Neck: No JVD, no thyromegaly, no carotid bruits Lungs: no use of accessory muscles, no dullness to percussion, clear without rales or rhonchi  Cardiovascular: Rhythm regular, heart sounds  normal, no murmurs or gallops, no peripheral edema Musculoskeletal: No deformities, no cyanosis or clubbing          Assessment & Plan:   Asthma with allergic rhinitis Stable asthma , mild  atopic features Plan Increase symbicort to two puff bid      Updated Medication List Outpatient Encounter Prescriptions as of 02/13/2011  Medication Sig Dispense Refill  . albuterol (PROAIR HFA) 108 (90 BASE) MCG/ACT inhaler Inhale 2 puffs into the lungs every 4 (four) hours as needed.        . budesonide-formoterol (SYMBICORT) 160-4.5 MCG/ACT inhaler Inhale 2 puffs into the lungs 2 (two) times daily.  1 Inhaler    . calcium carbonate (OS-CAL) 600 MG TABS Take 600 mg by mouth daily.        . Cholecalciferol (VITAMIN D3) 1000 UNITS CAPS Take 1 capsule by mouth daily.        . Multiple Vitamin (MULTIVITAMIN) capsule Take 1 capsule by mouth daily.        Marland Kitchen omeprazole (PRILOSEC) 20 MG capsule Take 1 capsule (20 mg total) by mouth daily.  90 capsule  3  . traZODone (DESYREL) 50 MG tablet Take 50 mg by mouth at bedtime as needed.       . triamterene-hydrochlorothiazide (MAXZIDE-25) 37.5-25 MG per tablet 1 tablet in the morning as needed ankle swelling       . DISCONTD: budesonide-formoterol (SYMBICORT) 160-4.5 MCG/ACT inhaler Inhale 1 puff into the lungs 2 (two) times daily.        Marland Kitchen DISCONTD: azelastine (ASTEPRO) 137 MCG/SPRAY nasal spray Place 1 spray into the nose 2 (two) times daily as needed. Use in each nostril as directed       . DISCONTD: beclomethasone (QVAR) 80 MCG/ACT inhaler Inhale 2 puffs into the lungs 2 (two) times daily.  1 Inhaler  6  . DISCONTD: Guaifenesin (MUCINEX MAXIMUM STRENGTH) 1200 MG TB12 Take 1 tablet by mouth 2 (two) times daily.

## 2011-02-14 NOTE — Assessment & Plan Note (Addendum)
Stable asthma , mild atopic features Plan Increase symbicort to two puff bid

## 2011-03-05 ENCOUNTER — Encounter: Payer: Self-pay | Admitting: Critical Care Medicine

## 2011-03-05 ENCOUNTER — Ambulatory Visit (INDEPENDENT_AMBULATORY_CARE_PROVIDER_SITE_OTHER): Payer: Medicare Other | Admitting: Critical Care Medicine

## 2011-03-05 VITALS — BP 146/80 | HR 74 | Temp 98.1°F | Ht 63.0 in | Wt 175.8 lb

## 2011-03-05 DIAGNOSIS — J45909 Unspecified asthma, uncomplicated: Secondary | ICD-10-CM

## 2011-03-05 MED ORDER — PREDNISONE 10 MG PO TABS: ORAL_TABLET | ORAL | Status: DC

## 2011-03-05 MED ORDER — FORMOTEROL FUMARATE 20 MCG/2ML IN NEBU: 20.0000 ug | INHALATION_SOLUTION | Freq: Two times a day (BID) | RESPIRATORY_TRACT | Status: DC

## 2011-03-05 MED ORDER — BUDESONIDE 0.25 MG/2ML IN SUSP: 0.2500 mg | Freq: Every day | RESPIRATORY_TRACT | Status: DC

## 2011-03-05 NOTE — Progress Notes (Signed)
Subjective:    Patient ID: Darlene Murray, female    DOB: Aug 19, 1921, 77 y.o.   MRN: [id number]  HPI  77 y.o. AAF with Moderate persistent asthma, Steroid dependent, atopic, GERD as ppt factors. Note HFA technique only at 30%  Note CXR normal 5/12  On Qvar & SABA, RAST ok. She does have nebuliser at home.  12/10/10  Post ER visit ER visit for congestion, dyspnea & chest pain >> CXR interstitial thickening Given pred 40 mg x 5ds Took cefdinir x 5 days >> diarrhea, hence stopped.  12/23/10 Saw RA and rx zpak after omniceph and pt got diarrhea.  HAd gone to ED.  No diarrhea now. No real cough.  Now no dyspnea.  No rescue inhaler use .  8/9 Last few weeks dyspnea.  Now on symbicort since 01/22/11 No cough now,  No saba use.   No sinus issues.   03/05/11 Notes more yellow mucus.  More dyspnea, no chest pain.  Notes some pn drip and nasal congestion.  No GERD symptoms Asthma History:   Symptoms  0-2 days/week  Daily       Nighttime Awakenings  0-2/month  0-2/month       Asthma interference with normal activity  No limitations  Some limitations       SABA use (not for EIB)  0-2 days/wk  Daily       Risk: Exacerbations requiring oral systemic steroids  0-1 / year  0-1 / year          Past Medical History  Diagnosis Date  . Blindness   . Asthmatic bronchitis   . Asthma   . Fibrocystic breast disease   . Allergic rhinitis   . Hyperlipidemia   . Venous insufficiency     lower extremities  . HNP (herniated nucleus pulposus), lumbar     L4-L5  . Left ventricular hypertrophy 2008    mild  . Antibiotic-associated diarrhea      Family History  Problem Relation Age of Onset  . Heart attack Sister   . Alzheimer's disease Sister   . Leukemia Mother   . Asthma Son      History   Social History  . Marital Status: Married    Spouse Name: N/A    Number of Children: 3  . Years of Education: N/A   Occupational History  . Retired     Runner, broadcasting/film/video   Social History Main Topics  .  Smoking status: Never Smoker   . Smokeless tobacco: Never Used  . Alcohol Use: Yes     wine occasionally  . Drug Use: No  . Sexually Active: Yes -- Female partner(s)   Other Topics Concern  . Not on file   Social History Narrative  . No narrative on file     No Known Allergies   Outpatient Prescriptions Prior to Visit  Medication Sig Dispense Refill  . albuterol (PROAIR HFA) 108 (90 BASE) MCG/ACT inhaler Inhale 2 puffs into the lungs every 4 (four) hours as needed.        . calcium carbonate (OS-CAL) 600 MG TABS Take 600 mg by mouth daily.        . Cholecalciferol (VITAMIN D3) 1000 UNITS CAPS Take 1 capsule by mouth daily.        . Multiple Vitamin (MULTIVITAMIN) capsule Take 1 capsule by mouth daily.        Marland Kitchen omeprazole (PRILOSEC) 20 MG capsule Take 1 capsule (20 mg total) by mouth daily.  90  capsule  3  . traZODone (DESYREL) 50 MG tablet Take 50 mg by mouth at bedtime as needed.       . triamterene-hydrochlorothiazide (MAXZIDE-25) 37.5-25 MG per tablet 1 tablet in the morning as needed ankle swelling       . budesonide-formoterol (SYMBICORT) 160-4.5 MCG/ACT inhaler Inhale 2 puffs into the lungs 2 (two) times daily.  1 Inhaler         Review of Systems  Pt denies any significant  nasal congestion or excess secretions, fever, chills, sweats, unintended wt loss, pleuritic or exertional cp, orthopnea pnd or leg swelling.  Pt also denies any obvious fluctuation in symptoms with weather or environmental change or other alleviating or aggravating factors.    Pt denies any increase in rescue therapy over baseline, denies waking up needing it or having early am exacerbations or coughing/wheezing/ or dyspnea       Objective:   Physical Exam  Gen. Pleasant, well-nourished, in no distress ENT - no lesions, no post nasal drip Neck: No JVD, no thyromegaly, no carotid bruits Lungs: no use of accessory muscles, no dullness to percussion, clear without rales or rhonchi  Cardiovascular:  Rhythm regular, heart sounds  normal, no murmurs or gallops, no peripheral edema Musculoskeletal: No deformities, no cyanosis or clubbing          Assessment & Plan:   Asthma with allergic rhinitis Moderate persistent asthma HFA technique only 30% up to 50% after retraining 5/12>>>only 50% with more training 12/23/10>>>75% 02/12/11 >>>20% 03/05/11 D/C HFAs 03/05/11, start BD Neb Meds d/t poor HFA technique PFTs 5/18: FeV1 94%  Fef 25 75 55%   >>>66% post BD Poor HFA technique leads to asthma flare Plan D/c hfa inhalers Start perforomist/budesonide bid in neb Pulse prednisone       Updated Medication List Outpatient Encounter Prescriptions as of 03/05/2011  Medication Sig Dispense Refill  . albuterol (PROAIR HFA) 108 (90 BASE) MCG/ACT inhaler Inhale 2 puffs into the lungs every 4 (four) hours as needed.        . calcium carbonate (OS-CAL) 600 MG TABS Take 600 mg by mouth daily.        . Cholecalciferol (VITAMIN D3) 1000 UNITS CAPS Take 1 capsule by mouth daily.        . Multiple Vitamin (MULTIVITAMIN) capsule Take 1 capsule by mouth daily.        Marland Kitchen omeprazole (PRILOSEC) 20 MG capsule Take 1 capsule (20 mg total) by mouth daily.  90 capsule  3  . traZODone (DESYREL) 50 MG tablet Take 50 mg by mouth at bedtime as needed.       . triamterene-hydrochlorothiazide (MAXZIDE-25) 37.5-25 MG per tablet 1 tablet in the morning as needed ankle swelling       . DISCONTD: budesonide-formoterol (SYMBICORT) 160-4.5 MCG/ACT inhaler Inhale 2 puffs into the lungs 2 (two) times daily.  1 Inhaler    . budesonide (PULMICORT) 0.25 MG/2ML nebulizer solution Take 2 mLs (0.25 mg total) by nebulization daily.  60 mL  12  . formoterol (PERFOROMIST) 20 MCG/2ML nebulizer solution Take 2 mLs (20 mcg total) by nebulization 2 (two) times daily.  120 mL  6  . predniSONE (DELTASONE) 10 MG tablet Take 4 for three days, 3 for three days, 2 for three days, 1 for three days and stop   30 tablet  0

## 2011-03-05 NOTE — Patient Instructions (Signed)
Stop Symbicort Start perforomist one in nebulizer twice daily Start budesonide (pulmicort) one in nebulizer twice daily Take prednisone 10mg  Take 4 for three days, 3 for three days, 2 for three days, 1 for three days and stop Return 2 weeks for recheck in [facility]

## 2011-03-06 NOTE — Assessment & Plan Note (Addendum)
Moderate persistent asthma HFA technique only 30% up to 50% after retraining 5/12>>>only 50% with more training 12/23/10>>>75% 02/12/11 >>>20% 03/05/11 D/C HFAs 03/05/11, start BD Neb Meds d/t poor HFA technique PFTs 5/18: FeV1 94%  Fef 25 75 55%   >>>66% post BD Poor HFA technique leads to asthma flare Plan D/c hfa inhalers Start perforomist/budesonide bid in neb Pulse prednisone

## 2011-03-20 ENCOUNTER — Encounter: Payer: Self-pay | Admitting: Critical Care Medicine

## 2011-03-20 ENCOUNTER — Ambulatory Visit (INDEPENDENT_AMBULATORY_CARE_PROVIDER_SITE_OTHER): Payer: Medicare Other | Admitting: Critical Care Medicine

## 2011-03-20 VITALS — BP 126/70 | HR 87 | Temp 98.0°F | Ht 63.0 in | Wt 177.0 lb

## 2011-03-20 DIAGNOSIS — Z23 Encounter for immunization: Secondary | ICD-10-CM

## 2011-03-20 DIAGNOSIS — J45909 Unspecified asthma, uncomplicated: Secondary | ICD-10-CM

## 2011-03-20 MED ORDER — BUDESONIDE 0.25 MG/2ML IN SUSP: 0.2500 mg | Freq: Two times a day (BID) | RESPIRATORY_TRACT | Status: DC

## 2011-03-20 MED ORDER — PREDNISONE 10 MG PO TABS: ORAL_TABLET | ORAL | Status: DC

## 2011-03-20 NOTE — Patient Instructions (Signed)
Take prednisone 10mg  Take 4 for two days three for two days two for two days one for two days  Increase budesonide to twice daily Stay on perforomist twice daily Return one month Flu vaccine today

## 2011-03-20 NOTE — Progress Notes (Signed)
Subjective:    Patient ID: Darlene Murray, female    DOB: March 05, 1922, 77 y.o.   MRN: [id number]  HPI  77 y.o. AAF with Moderate persistent asthma, Steroid dependent, atopic, GERD as ppt factors. Note HFA technique only at 30%  Note CXR normal 5/12  On Qvar & SABA, RAST ok. She does have nebuliser at home.  12/10/10  Post ER visit ER visit for congestion, dyspnea & chest pain >> CXR interstitial thickening Given pred 40 mg x 5ds Took cefdinir x 5 days >> diarrhea, hence stopped.  12/23/10 Saw RA and rx zpak after omniceph and pt got diarrhea.  HAd gone to ED.  No diarrhea now. No real cough.  Now no dyspnea.  No rescue inhaler use .  8/9 Last few weeks dyspnea.  Now on symbicort since 01/22/11 No cough now,  No saba use.   No sinus issues.   03/05/11 Notes more yellow mucus.  More dyspnea, no chest pain.  Notes some pn drip and nasal congestion.  No GERD symptoms Asthma History:   Symptoms  0-2 days/week  Daily       Nighttime Awakenings  0-2/month  0-2/month       Asthma interference with normal activity  No limitations  Some limitations       SABA use (not for EIB)  0-2 days/wk  Daily       Risk: Exacerbations requiring oral systemic steroids  0-1 / year  0-1 / year        03/20/2011  Only taking budesonide daily.  Feeling better overall Pt denies any significant sore throat, nasal congestion or excess secretions, fever, chills, sweats, unintended weight loss, pleurtic or exertional chest pain, orthopnea PND, or leg swelling Pt denies any increase in rescue therapy over baseline, denies waking up needing it or having any early am or nocturnal exacerbations of coughing/wheezing/or dyspnea. Pt also denies any obvious fluctuation in symptoms with  weather or environmental change or other alleviating or aggravating factors   Past Medical History  Diagnosis Date  . Blindness   . Asthmatic bronchitis   . Asthma   . Fibrocystic breast disease   . Allergic rhinitis   . Hyperlipidemia    . Venous insufficiency     lower extremities  . HNP (herniated nucleus pulposus), lumbar     L4-L5  . Left ventricular hypertrophy 2008    mild  . Antibiotic-associated diarrhea      Family History  Problem Relation Age of Onset  . Heart attack Sister   . Alzheimer's disease Sister   . Leukemia Mother   . Asthma Son      History   Social History  . Marital Status: Married    Spouse Name: N/A    Number of Children: 3  . Years of Education: N/A   Occupational History  . Retired     Runner, broadcasting/film/video   Social History Main Topics  . Smoking status: Never Smoker   . Smokeless tobacco: Never Used  . Alcohol Use: Yes     wine occasionally  . Drug Use: No  . Sexually Active: Yes -- Female partner(s)   Other Topics Concern  . Not on file   Social History Narrative  . No narrative on file     No Known Allergies   Outpatient Prescriptions Prior to Visit  Medication Sig Dispense Refill  . albuterol (PROAIR HFA) 108 (90 BASE) MCG/ACT inhaler Inhale 2 puffs into the lungs every 4 (four) hours as needed.        Marland Kitchen  calcium carbonate (OS-CAL) 600 MG TABS Take 600 mg by mouth daily.        . Cholecalciferol (VITAMIN D3) 1000 UNITS CAPS Take 1 capsule by mouth daily.        . formoterol (PERFOROMIST) 20 MCG/2ML nebulizer solution Take 2 mLs (20 mcg total) by nebulization 2 (two) times daily.  120 mL  6  . Multiple Vitamin (MULTIVITAMIN) capsule Take 1 capsule by mouth daily.        Marland Kitchen omeprazole (PRILOSEC) 20 MG capsule Take 1 capsule (20 mg total) by mouth daily.  90 capsule  3  . traZODone (DESYREL) 50 MG tablet Take 50 mg by mouth at bedtime as needed.       . triamterene-hydrochlorothiazide (MAXZIDE-25) 37.5-25 MG per tablet 1 tablet in the morning as needed ankle swelling       . budesonide (PULMICORT) 0.25 MG/2ML nebulizer solution Take 2 mLs (0.25 mg total) by nebulization daily.  60 mL  12  . predniSONE (DELTASONE) 10 MG tablet Take 4 for three days, 3 for three days, 2 for three  days, 1 for three days and stop   30 tablet  0       Review of Systems  Pt denies any significant  nasal congestion or excess secretions, fever, chills, sweats, unintended wt loss, pleuritic or exertional cp, orthopnea pnd or leg swelling.  Pt also denies any obvious fluctuation in symptoms with weather or environmental change or other alleviating or aggravating factors.    Pt denies any increase in rescue therapy over baseline, denies waking up needing it or having early am exacerbations or coughing/wheezing/ or dyspnea       Objective:   Physical Exam  Gen. Pleasant, well-nourished, in no distress ENT - no lesions, no post nasal drip Neck: No JVD, no thyromegaly, no carotid bruits Lungs: no use of accessory muscles, no dullness to percussion, clear without rales or rhonchi  Cardiovascular: Rhythm regular, heart sounds  normal, no murmurs or gallops, no peripheral edema Musculoskeletal: No deformities, no cyanosis or clubbing          Assessment & Plan:   Asthma with allergic rhinitis Moderate persistent asthma HFA technique only 30% up to 50% after retraining 5/12>>>only 50% with more training 12/23/10>>>75% 02/12/11 >>>20% 03/05/11 D/C HFAs 03/05/11, start BD Neb Meds d/t poor HFA technique PFTs 5/18: FeV1 94%  Fef 25 75 55%   >>>66% post BD IMproved with BD neb med but only getting budesonide daily Plan Increase budesonide to bid Pulse pred Cont bid perforomist     Flu vaccine today Updated Medication List Outpatient Encounter Prescriptions as of 03/20/2011  Medication Sig Dispense Refill  . albuterol (PROAIR HFA) 108 (90 BASE) MCG/ACT inhaler Inhale 2 puffs into the lungs every 4 (four) hours as needed.        . budesonide (PULMICORT) 0.25 MG/2ML nebulizer solution Take 2 mLs (0.25 mg total) by nebulization 2 (two) times daily.  120 mL  12  . calcium carbonate (OS-CAL) 600 MG TABS Take 600 mg by mouth daily.        . Cholecalciferol (VITAMIN D3) 1000 UNITS CAPS Take  1 capsule by mouth daily.        . formoterol (PERFOROMIST) 20 MCG/2ML nebulizer solution Take 2 mLs (20 mcg total) by nebulization 2 (two) times daily.  120 mL  6  . Multiple Vitamin (MULTIVITAMIN) capsule Take 1 capsule by mouth daily.        Marland Kitchen omeprazole (PRILOSEC) 20 MG  capsule Take 1 capsule (20 mg total) by mouth daily.  90 capsule  3  . traZODone (DESYREL) 50 MG tablet Take 50 mg by mouth at bedtime as needed.       . triamterene-hydrochlorothiazide (MAXZIDE-25) 37.5-25 MG per tablet 1 tablet in the morning as needed ankle swelling       . DISCONTD: budesonide (PULMICORT) 0.25 MG/2ML nebulizer solution Take 2 mLs (0.25 mg total) by nebulization daily.  60 mL  12  . predniSONE (DELTASONE) 10 MG tablet Take 4 for two days three for two days two for two days one for two days    20 tablet  0  . DISCONTD: predniSONE (DELTASONE) 10 MG tablet Take 4 for three days, 3 for three days, 2 for three days, 1 for three days and stop   30 tablet  0

## 2011-03-20 NOTE — Assessment & Plan Note (Signed)
Moderate persistent asthma HFA technique only 30% up to 50% after retraining 5/12>>>only 50% with more training 12/23/10>>>75% 02/12/11 >>>20% 03/05/11 D/C HFAs 03/05/11, start BD Neb Meds d/t poor HFA technique PFTs 5/18: FeV1 94%  Fef 25 75 55%   >>>66% post BD IMproved with BD neb med but only getting budesonide daily Plan Increase budesonide to bid Pulse pred Cont bid perforomist

## 2011-04-17 ENCOUNTER — Ambulatory Visit (INDEPENDENT_AMBULATORY_CARE_PROVIDER_SITE_OTHER): Payer: Medicare Other | Admitting: Critical Care Medicine

## 2011-04-17 ENCOUNTER — Encounter: Payer: Self-pay | Admitting: Critical Care Medicine

## 2011-04-17 DIAGNOSIS — J45909 Unspecified asthma, uncomplicated: Secondary | ICD-10-CM

## 2011-04-17 MED ORDER — PREDNISONE 10 MG PO TABS: ORAL_TABLET | ORAL | Status: DC

## 2011-04-17 NOTE — Progress Notes (Signed)
Subjective:    Patient ID: Darlene Murray, female    DOB: Dec 29, 1921, 77 y.o.   MRN: [id number]  HPI  77 y.o. AAF with Moderate persistent asthma, Steroid dependent, atopic, GERD as ppt factors. Note HFA technique only at 30%  Note CXR normal 5/12  On Qvar & SABA, RAST ok. She does have nebuliser at home.  12/10/10  Post ER visit ER visit for congestion, dyspnea & chest pain >> CXR interstitial thickening Given pred 40 mg x 5ds Took cefdinir x 5 days >> diarrhea, hence stopped.  12/23/10 Saw RA and rx zpak after omniceph and pt got diarrhea.  HAd gone to ED.  No diarrhea now. No real cough.  Now no dyspnea.  No rescue inhaler use .  8/9 Last few weeks dyspnea.  Now on symbicort since 01/22/11 No cough now,  No saba use.   No sinus issues.   03/05/11 Notes more yellow mucus.  More dyspnea, no chest pain.  Notes some pn drip and nasal congestion.  No GERD symptoms Asthma History:   Symptoms  0-2 days/week  Daily       Nighttime Awakenings  0-2/month  0-2/month       Asthma interference with normal activity  No limitations  Some limitations       SABA use (not for EIB)  0-2 days/wk  Daily       Risk: Exacerbations requiring oral systemic steroids  0-1 / year  0-1 / year        9/13  Only taking budesonide daily.  Feeling better overall Pt denies any significant sore throat, nasal congestion or excess secretions, fever, chills, sweats, unintended weight loss, pleurtic or exertional chest pain, orthopnea PND, or leg swelling Pt denies any increase in rescue therapy over baseline, denies waking up needing it or having any early am or nocturnal exacerbations of coughing/wheezing/or dyspnea. Pt also denies any obvious fluctuation in symptoms with  weather or environmental change or other alleviating or aggravating factors  04/17/2011 Now more cough and dyspnea. Symptoms since 10/6,  No real sinus drip. Notes some pressure in the face. Notes more wheezing and sl discolored mucus.  No chest  pain . No f/c/s No GERD symptoms   Past Medical History  Diagnosis Date  . Blindness   . Asthmatic bronchitis   . Asthma   . Fibrocystic breast disease   . Allergic rhinitis   . Hyperlipidemia   . Venous insufficiency     lower extremities  . HNP (herniated nucleus pulposus), lumbar     L4-L5  . Left ventricular hypertrophy 2008    mild  . Antibiotic-associated diarrhea      Family History  Problem Relation Age of Onset  . Heart attack Sister   . Alzheimer's disease Sister   . Leukemia Mother   . Asthma Son      History   Social History  . Marital Status: Married    Spouse Name: N/A    Number of Children: 3  . Years of Education: N/A   Occupational History  . Retired     Runner, broadcasting/film/video   Social History Main Topics  . Smoking status: Former Smoker    Types: Cigarettes    Quit date: 04/17/1999  . Smokeless tobacco: Never Used  . Alcohol Use: Yes     wine occasionally  . Drug Use: No  . Sexually Active: Yes -- Female partner(s)   Other Topics Concern  . Not on file   Social History  Narrative  . No narrative on file     No Known Allergies   Outpatient Prescriptions Prior to Visit  Medication Sig Dispense Refill  . albuterol (PROAIR HFA) 108 (90 BASE) MCG/ACT inhaler Inhale 2 puffs into the lungs every 4 (four) hours as needed.        . budesonide (PULMICORT) 0.25 MG/2ML nebulizer solution Take 2 mLs (0.25 mg total) by nebulization 2 (two) times daily.  120 mL  12  . calcium carbonate (OS-CAL) 600 MG TABS Take 600 mg by mouth daily.        . Cholecalciferol (VITAMIN D3) 1000 UNITS CAPS Take 1 capsule by mouth daily.        . formoterol (PERFOROMIST) 20 MCG/2ML nebulizer solution Take 2 mLs (20 mcg total) by nebulization 2 (two) times daily.  120 mL  6  . Multiple Vitamin (MULTIVITAMIN) capsule Take 1 capsule by mouth daily.        Marland Kitchen omeprazole (PRILOSEC) 20 MG capsule Take 1 capsule (20 mg total) by mouth daily.  90 capsule  3  . traZODone (DESYREL) 50 MG  tablet Take 50 mg by mouth at bedtime as needed.       . triamterene-hydrochlorothiazide (MAXZIDE-25) 37.5-25 MG per tablet 1 tablet in the morning as needed ankle swelling       . predniSONE (DELTASONE) 10 MG tablet Take 4 for two days three for two days two for two days one for two days    20 tablet  0       Review of Systems Constitutional:   No  weight loss, night sweats,  Fevers, chills, fatigue, lassitude. HEENT:   No headaches,  Difficulty swallowing,  Tooth/dental problems,  Sore throat,                No sneezing, itching, ear ache, nasal congestion, post nasal drip,   CV:  No chest pain,  Orthopnea, PND, swelling in lower extremities, anasarca, dizziness, palpitations  GI  No heartburn, indigestion, abdominal pain, nausea, vomiting, diarrhea, change in bowel habits, loss of appetite  Resp: Notes  shortness of breath with exertion not  at rest.  Notes  excess mucus, no productive cough,  No non-productive cough,  No coughing up of blood.  Notes  change in color of mucus.  No wheezing.  No chest wall deformity  Skin: no rash or lesions.  GU: no dysuria, change in color of urine, no urgency or frequency.  No flank pain.  MS:  No joint pain or swelling.  No decreased range of motion.  No back pain.  Psych:  No change in mood or affect. No depression or anxiety.  No memory loss.     Objective:   Physical Exam  Filed Vitals:   04/17/11 0907  BP: 150/90  Pulse: 83  Temp: 98 F (36.7 C)  TempSrc: Oral  Height: 5\' 3"  (1.6 m)  Weight: 176 lb (79.833 kg)  SpO2: 95%    Gen: Pleasant, well-nourished, in no distress,  normal affect  ENT: No lesions,  mouth clear,  oropharynx clear, no postnasal drip  Neck: No JVD, no TMG, no carotid bruits  Lungs: No use of accessory muscles, no dullness to percussion, insp /exp wheeze  Cardiovascular: RRR, heart sounds normal, no murmur or gallops, no peripheral edema  Abdomen: soft and NT, no HSM,  BS normal  Musculoskeletal: No  deformities, no cyanosis or clubbing  Neuro: alert, non focal  Skin: Warm, no lesions or rashes  Assessment & Plan:   Asthma with allergic rhinitis Asthmatic bronchitis with prior smoking exposure Plan Repulse prednisone Continue BD neb meds    Flu vaccine today Updated Medication List Outpatient Encounter Prescriptions as of 04/17/2011  Medication Sig Dispense Refill  . albuterol (PROAIR HFA) 108 (90 BASE) MCG/ACT inhaler Inhale 2 puffs into the lungs every 4 (four) hours as needed.        . budesonide (PULMICORT) 0.25 MG/2ML nebulizer solution Take 2 mLs (0.25 mg total) by nebulization 2 (two) times daily.  120 mL  12  . calcium carbonate (OS-CAL) 600 MG TABS Take 600 mg by mouth daily.        . Cholecalciferol (VITAMIN D3) 1000 UNITS CAPS Take 1 capsule by mouth daily.        . formoterol (PERFOROMIST) 20 MCG/2ML nebulizer solution Take 2 mLs (20 mcg total) by nebulization 2 (two) times daily.  120 mL  6  . Guaifenesin (MUCINEX MAXIMUM STRENGTH) 1200 MG TB12 Take 1 tablet by mouth 2 (two) times daily.        . Multiple Vitamin (MULTIVITAMIN) capsule Take 1 capsule by mouth daily.        Marland Kitchen omeprazole (PRILOSEC) 20 MG capsule Take 1 capsule (20 mg total) by mouth daily.  90 capsule  3  . traZODone (DESYREL) 50 MG tablet Take 50 mg by mouth at bedtime as needed.       . triamterene-hydrochlorothiazide (MAXZIDE-25) 37.5-25 MG per tablet 1 tablet in the morning as needed ankle swelling       . predniSONE (DELTASONE) 10 MG tablet Take 4 for three days 3 for three days 2 for three days 1 for three days and stop  30 tablet  0  . DISCONTD: predniSONE (DELTASONE) 10 MG tablet Take 4 for two days three for two days two for two days one for two days    20 tablet  0

## 2011-04-17 NOTE — Assessment & Plan Note (Signed)
Asthmatic bronchitis with prior smoking exposure Plan Repulse prednisone Continue BD neb meds

## 2011-04-17 NOTE — Patient Instructions (Signed)
Take prednisone 10mg  Take 4 for three days 3 for three days 2 for three days 1 for three days and stop, this was sent to the pharmacy downstairs. No other medication changes Return 3 weeks [place] for recheck

## 2011-05-08 ENCOUNTER — Encounter: Payer: Self-pay | Admitting: Critical Care Medicine

## 2011-05-08 ENCOUNTER — Ambulatory Visit (INDEPENDENT_AMBULATORY_CARE_PROVIDER_SITE_OTHER): Payer: Medicare Other | Admitting: Critical Care Medicine

## 2011-05-08 VITALS — BP 134/78 | HR 87 | Temp 98.3°F | Ht 63.0 in | Wt 175.0 lb

## 2011-05-08 DIAGNOSIS — J45902 Unspecified asthma with status asthmaticus: Secondary | ICD-10-CM

## 2011-05-08 DIAGNOSIS — J45909 Unspecified asthma, uncomplicated: Secondary | ICD-10-CM

## 2011-05-08 MED ORDER — MONTELUKAST SODIUM 10 MG PO TABS: 10.0000 mg | ORAL_TABLET | Freq: Every day | ORAL | Status: DC

## 2011-05-08 MED ORDER — PREDNISONE 10 MG PO TABS: ORAL_TABLET | ORAL | Status: DC

## 2011-05-08 NOTE — Assessment & Plan Note (Signed)
Asthmatic bronchitis with prior smoking exposure, the pt appears steroid dependent.  She flare after every taper of pred. No specific triggers identified except prior passive smoke exposure, Poor HFA technique but pt not responding to BD neb meds well alone Plan Repulse prednisone and hold pred on taper to 20mg /d Start singulair 10mg /d Continue BD neb meds

## 2011-05-08 NOTE — Progress Notes (Signed)
Subjective:    Patient ID: Darlene Murray, female    DOB: 10-31-1921, 77 y.o.   MRN: [id number]  HPI  77 y.o. AAF with Moderate persistent asthma, Steroid dependent, atopic, GERD as ppt factors. Note HFA technique only at 30%  Note CXR normal 5/12  On Qvar & SABA, RAST ok. She does have nebuliser at home.  12/10/10  Post ER visit ER visit for congestion, dyspnea & chest pain >> CXR interstitial thickening Given pred 40 mg x 5ds Took cefdinir x 5 days >> diarrhea, hence stopped.  12/23/10 Saw RA and rx zpak after omniceph and pt got diarrhea.  HAd gone to ED.  No diarrhea now. No real cough.  Now no dyspnea.  No rescue inhaler use .  8/9 Last few weeks dyspnea.  Now on symbicort since 01/22/11 No cough now,  No saba use.   No sinus issues.   03/05/11 Notes more yellow mucus.  More dyspnea, no chest pain.  Notes some pn drip and nasal congestion.  No GERD symptoms Asthma History:   Symptoms  0-2 days/week  Daily       Nighttime Awakenings  0-2/month  0-2/month       Asthma interference with normal activity  No limitations  Some limitations       SABA use (not for EIB)  0-2 days/wk  Daily       Risk: Exacerbations requiring oral systemic steroids  0-1 / year  0-1 / year        9/13  Only taking budesonide daily.  Feeling better overall Pt denies any significant sore throat, nasal congestion or excess secretions, fever, chills, sweats, unintended weight loss, pleurtic or exertional chest pain, orthopnea PND, or leg swelling Pt denies any increase in rescue therapy over baseline, denies waking up needing it or having any early am or nocturnal exacerbations of coughing/wheezing/or dyspnea. Pt also denies any obvious fluctuation in symptoms with  weather or environmental change or other alleviating or aggravating factors  10/11 Now more cough and dyspnea. Symptoms since 10/6,  No real sinus drip. Notes some pressure in the face. Notes more wheezing and sl discolored mucus.  No chest pain  . No f/c/s No GERD symptoms  05/08/2011 Notes worse off pred, stopped 10days ago and then 7days later is worse. All symptoms as before wheeze, cough and mucus.  Not dyspneic.  No rescue inhaler use. Allergy testing in the past neg.  No allergy shots. Nasal drip is clear.  Past Medical History  Diagnosis Date  . Blindness   . Asthmatic bronchitis   . Asthma   . Fibrocystic breast disease   . Allergic rhinitis   . Hyperlipidemia   . Venous insufficiency     lower extremities  . HNP (herniated nucleus pulposus), lumbar     L4-L5  . Left ventricular hypertrophy 2008    mild  . Antibiotic-associated diarrhea      Family History  Problem Relation Age of Onset  . Heart attack Sister   . Alzheimer's disease Sister   . Leukemia Mother   . Asthma Son      History   Social History  . Marital Status: Married    Spouse Name: N/A    Number of Children: 3  . Years of Education: N/A   Occupational History  . Retired     Runner, broadcasting/film/video   Social History Main Topics  . Smoking status: Never Smoker   . Smokeless tobacco: Never Used  . Alcohol Use:  Yes     wine occasionally  . Drug Use: No  . Sexually Active: Yes -- Female partner(s)   Other Topics Concern  . Not on file   Social History Narrative  . No narrative on file     No Known Allergies   Outpatient Prescriptions Prior to Visit  Medication Sig Dispense Refill  . albuterol (PROAIR HFA) 108 (90 BASE) MCG/ACT inhaler Inhale 2 puffs into the lungs every 4 (four) hours as needed.        . budesonide (PULMICORT) 0.25 MG/2ML nebulizer solution Take 2 mLs (0.25 mg total) by nebulization 2 (two) times daily.  120 mL  12  . calcium carbonate (OS-CAL) 600 MG TABS Take 600 mg by mouth daily.        . Cholecalciferol (VITAMIN D3) 1000 UNITS CAPS Take 1 capsule by mouth daily.        . formoterol (PERFOROMIST) 20 MCG/2ML nebulizer solution Take 2 mLs (20 mcg total) by nebulization 2 (two) times daily.  120 mL  6  . Guaifenesin  (MUCINEX MAXIMUM STRENGTH) 1200 MG TB12 Take 1 tablet by mouth 2 (two) times daily.        . Multiple Vitamin (MULTIVITAMIN) capsule Take 1 capsule by mouth daily.        Marland Kitchen omeprazole (PRILOSEC) 20 MG capsule Take 1 capsule (20 mg total) by mouth daily.  90 capsule  3  . traZODone (DESYREL) 50 MG tablet Take 50 mg by mouth at bedtime as needed.       . triamterene-hydrochlorothiazide (MAXZIDE-25) 37.5-25 MG per tablet 1 tablet in the morning as needed ankle swelling       . predniSONE (DELTASONE) 10 MG tablet Take 4 for three days 3 for three days 2 for three days 1 for three days and stop  30 tablet  0       Review of Systems Constitutional:   No  weight loss, night sweats,  Fevers, chills, fatigue, lassitude. HEENT:   No headaches,  Difficulty swallowing,  Tooth/dental problems,  Sore throat,                No sneezing, itching, ear ache, nasal congestion, post nasal drip,   CV:  No chest pain,  Orthopnea, PND, swelling in lower extremities, anasarca, dizziness, palpitations  GI  No heartburn, indigestion, abdominal pain, nausea, vomiting, diarrhea, change in bowel habits, loss of appetite  Resp: Notes  shortness of breath with exertion not  at rest.  Notes  excess mucus, no productive cough,  No non-productive cough,  No coughing up of blood.  Notes  change in color of mucus.  No wheezing.  No chest wall deformity  Skin: no rash or lesions.  GU: no dysuria, change in color of urine, no urgency or frequency.  No flank pain.  MS:  No joint pain or swelling.  No decreased range of motion.  No back pain.  Psych:  No change in mood or affect. No depression or anxiety.  No memory loss.     Objective:   Physical Exam  Filed Vitals:   05/08/11 0906  BP: 134/78  Pulse: 87  Temp: 98.3 F (36.8 C)  TempSrc: Oral  Height: 5\' 3"  (1.6 m)  Weight: 175 lb (79.379 kg)  SpO2: 100%    Gen: Pleasant, well-nourished, in no distress,  normal affect  ENT: No lesions,  mouth clear,   oropharynx clear, no postnasal drip  Neck: No JVD, no TMG, no carotid bruits  Lungs: No use of accessory muscles, no dullness to percussion, insp /exp wheeze, coarse rhonchi  Cardiovascular: RRR, heart sounds normal, no murmur or gallops, no peripheral edema  Abdomen: soft and NT, no HSM,  BS normal  Musculoskeletal: No deformities, no cyanosis or clubbing  Neuro: alert, non focal  Skin: Warm, no lesions or rashes        Assessment & Plan:   Asthma with allergic rhinitis Asthmatic bronchitis with prior smoking exposure, the pt appears steroid dependent.  She flare after every taper of pred. No specific triggers identified except prior passive smoke exposure, Poor HFA technique but pt not responding to BD neb meds well alone Plan Repulse prednisone and hold pred on taper to 20mg /d Start singulair 10mg /d Continue BD neb meds      Flu vaccine today Updated Medication List Outpatient Encounter Prescriptions as of 05/08/2011  Medication Sig Dispense Refill  . albuterol (PROAIR HFA) 108 (90 BASE) MCG/ACT inhaler Inhale 2 puffs into the lungs every 4 (four) hours as needed.        . budesonide (PULMICORT) 0.25 MG/2ML nebulizer solution Take 2 mLs (0.25 mg total) by nebulization 2 (two) times daily.  120 mL  12  . calcium carbonate (OS-CAL) 600 MG TABS Take 600 mg by mouth daily.        . Cholecalciferol (VITAMIN D3) 1000 UNITS CAPS Take 1 capsule by mouth daily.        . formoterol (PERFOROMIST) 20 MCG/2ML nebulizer solution Take 2 mLs (20 mcg total) by nebulization 2 (two) times daily.  120 mL  6  . Guaifenesin (MUCINEX MAXIMUM STRENGTH) 1200 MG TB12 Take 1 tablet by mouth 2 (two) times daily.        . Multiple Vitamin (MULTIVITAMIN) capsule Take 1 capsule by mouth daily.        Marland Kitchen omeprazole (PRILOSEC) 20 MG capsule Take 1 capsule (20 mg total) by mouth daily.  90 capsule  3  . traZODone (DESYREL) 50 MG tablet Take 50 mg by mouth at bedtime as needed.       .  triamterene-hydrochlorothiazide (MAXZIDE-25) 37.5-25 MG per tablet 1 tablet in the morning as needed ankle swelling       . montelukast (SINGULAIR) 10 MG tablet Take 1 tablet (10 mg total) by mouth daily.  30 tablet  6  . predniSONE (DELTASONE) 10 MG tablet Take 4 for four days 3 for four days then 2 daily  60 tablet  6  . DISCONTD: predniSONE (DELTASONE) 10 MG tablet Take 4 for three days 3 for three days 2 for three days 1 for three days and stop  30 tablet  0

## 2011-05-08 NOTE — Patient Instructions (Signed)
Start singulair one daily Start prednisone 10mg  Take 4 for four days 3 for four days then 2 a day and stay Return 3 weeks [place]

## 2011-06-02 ENCOUNTER — Encounter: Payer: Self-pay | Admitting: Critical Care Medicine

## 2011-06-02 ENCOUNTER — Ambulatory Visit (INDEPENDENT_AMBULATORY_CARE_PROVIDER_SITE_OTHER): Payer: Medicare Other | Admitting: Critical Care Medicine

## 2011-06-02 DIAGNOSIS — J45902 Unspecified asthma with status asthmaticus: Secondary | ICD-10-CM

## 2011-06-02 DIAGNOSIS — J45909 Unspecified asthma, uncomplicated: Secondary | ICD-10-CM

## 2011-06-02 MED ORDER — PREDNISONE 10 MG PO TABS: 10.0000 mg | ORAL_TABLET | Freq: Every day | ORAL | Status: DC

## 2011-06-02 NOTE — Patient Instructions (Signed)
Stay on prednisone 10mg  daily I will try to get perforomist samples for you to use, pick up at main office Stay on budesonide twice daily Return 1 month

## 2011-06-02 NOTE — Assessment & Plan Note (Signed)
Moderate persistent asthma, atopy, passive smoke exposure HFA technique only 30% up to 50% after retraining 5/12>>>only 50% with more training 12/23/10>>>75% 02/12/11 >>>20% 03/05/11 D/C HFAs 03/05/11, start BD Neb Meds d/t poor HFA technique PFTs 5/18: FeV1 94%  Fef 25 75 55%   >>>66% post BD Maintaining Prednisone 10mg /d for maintenance RX 06/02/11  Severe persistent asthma improved chronic maintenance oral prednisone Plan Maintain prednisone as prescribed at 10 mg daily Maintain current inhaled medications as prescribed

## 2011-06-02 NOTE — Progress Notes (Signed)
Subjective:    Patient ID: Darlene Murray, female    DOB: January 15, 1922, 77 y.o.   MRN: [id number]  HPI  77 y.o. AAF with Moderate persistent asthma, Steroid dependent, atopic, GERD as ppt factors. Note HFA technique only at 30%    06/02/2011 No real nasal drip.  No cough now.  No mucus.  Not as dypsneic. Pt denies any significant sore throat, nasal congestion or excess secretions, fever, chills, sweats, unintended weight loss, pleurtic or exertional chest pain, orthopnea PND, or leg swelling Pt denies any increase in rescue therapy over baseline, denies waking up needing it or having any early am or nocturnal exacerbations of coughing/wheezing/or dyspnea. Pt also denies any obvious fluctuation in symptoms with  weather or environmental change or other alleviating or aggravating factors  PUL ASTHMA HISTORY 06/02/2011 05/08/2011 03/20/2011 03/05/2011 02/13/2011  Symptoms 0-2 days/week Daily >2 days/week Daily 0-2 days/week  Nighttime awakenings 0-2/month >1/wk but not nightly 0-2/month 0-2/month 0-2/month  Interference with activity No limitations Some limitations Minor limitations Some limitations No limitations  SABA use 0-2 days/wk 0-2 days/wk 0-2 days/wk Daily 0-2 days/wk  Exacerbations requiring oral steroids 2 or more / year 2 or more / year 0-1 / year 0-1 / year 0-1 / year     Past Medical History  Diagnosis Date  . Blindness   . Asthmatic bronchitis   . Asthma   . Fibrocystic breast disease   . Allergic rhinitis   . Hyperlipidemia   . Venous insufficiency     lower extremities  . HNP (herniated nucleus pulposus), lumbar     L4-L5  . Left ventricular hypertrophy 2008    mild  . Antibiotic-associated diarrhea      Family History  Problem Relation Age of Onset  . Heart attack Sister   . Alzheimer's disease Sister   . Leukemia Mother   . Asthma Son      History   Social History  . Marital Status: Married    Spouse Name: N/A    Number of Children: 3  . Years of  Education: N/A   Occupational History  . Retired     Runner, broadcasting/film/video   Social History Main Topics  . Smoking status: Never Smoker   . Smokeless tobacco: Never Used  . Alcohol Use: Yes     wine occasionally  . Drug Use: No  . Sexually Active: Yes -- Female partner(s)   Other Topics Concern  . Not on file   Social History Narrative  . No narrative on file     No Known Allergies   Outpatient Prescriptions Prior to Visit  Medication Sig Dispense Refill  . albuterol (PROAIR HFA) 108 (90 BASE) MCG/ACT inhaler Inhale 2 puffs into the lungs every 4 (four) hours as needed.        . budesonide (PULMICORT) 0.25 MG/2ML nebulizer solution Take 2 mLs (0.25 mg total) by nebulization 2 (two) times daily.  120 mL  12  . calcium carbonate (OS-CAL) 600 MG TABS Take 600 mg by mouth daily.        . Cholecalciferol (VITAMIN D3) 1000 UNITS CAPS Take 1 capsule by mouth daily.        . formoterol (PERFOROMIST) 20 MCG/2ML nebulizer solution Take 2 mLs (20 mcg total) by nebulization 2 (two) times daily.  120 mL  6  . montelukast (SINGULAIR) 10 MG tablet Take 1 tablet (10 mg total) by mouth daily.  30 tablet  6  . Multiple Vitamin (MULTIVITAMIN) capsule Take 1 capsule  by mouth daily.        Marland Kitchen omeprazole (PRILOSEC) 20 MG capsule Take 1 capsule (20 mg total) by mouth daily.  90 capsule  3  . traZODone (DESYREL) 50 MG tablet Take 50 mg by mouth at bedtime as needed.       . triamterene-hydrochlorothiazide (MAXZIDE-25) 37.5-25 MG per tablet 1 tablet in the morning as needed ankle swelling       . predniSONE (DELTASONE) 10 MG tablet Take 4 for four days 3 for four days then 2 daily  60 tablet  6  . Guaifenesin (MUCINEX MAXIMUM STRENGTH) 1200 MG TB12 Take 1 tablet by mouth 2 (two) times daily.             Review of Systems Constitutional:   No  weight loss, night sweats,  Fevers, chills, fatigue, lassitude. HEENT:   No headaches,  Difficulty swallowing,  Tooth/dental problems,  Sore throat,                No  sneezing, itching, ear ache, nasal congestion, post nasal drip,   CV:  No chest pain,  Orthopnea, PND, swelling in lower extremities, anasarca, dizziness, palpitations  GI  No heartburn, indigestion, abdominal pain, nausea, vomiting, diarrhea, change in bowel habits, loss of appetite  Resp: Notes  shortness of breath with exertion not  at rest.  Notes  excess mucus, no productive cough,  No non-productive cough,  No coughing up of blood.  Notes  change in color of mucus.  No wheezing.  No chest wall deformity  Skin: no rash or lesions.  GU: no dysuria, change in color of urine, no urgency or frequency.  No flank pain.  MS:  No joint pain or swelling.  No decreased range of motion.  No back pain.  Psych:  No change in mood or affect. No depression or anxiety.  No memory loss.     Objective:   Physical Exam  Filed Vitals:   06/02/11 0906  BP: 130/78  Pulse: 92  Temp: 97.9 F (36.6 C)  TempSrc: Oral  Height: 5\' 3"  (1.6 m)  Weight: 182 lb (82.555 kg)  SpO2: 100%    Gen: Pleasant, well-nourished, in no distress,  normal affect  ENT: No lesions,  mouth clear,  oropharynx clear, no postnasal drip  Neck: No JVD, no TMG, no carotid bruits  Lungs: No use of accessory muscles, no dullness to percussion, no rhonchi , no wheezes  Cardiovascular: RRR, heart sounds normal, no murmur or gallops, no peripheral edema  Abdomen: soft and NT, no HSM,  BS normal  Musculoskeletal: No deformities, no cyanosis or clubbing  Neuro: alert, non focal  Skin: Warm, no lesions or rashes        Assessment & Plan:   Asthma with allergic rhinitis Moderate persistent asthma, atopy, passive smoke exposure HFA technique only 30% up to 50% after retraining 5/12>>>only 50% with more training 12/23/10>>>75% 02/12/11 >>>20% 03/05/11 D/C HFAs 03/05/11, start BD Neb Meds d/t poor HFA technique PFTs 5/18: FeV1 94%  Fef 25 75 55%   >>>66% post BD Maintaining Prednisone 10mg /d for maintenance RX  06/02/11  Severe persistent asthma improved chronic maintenance oral prednisone Plan Maintain prednisone as prescribed at 10 mg daily Maintain current inhaled medications as prescribed    Flu vaccine today Updated Medication List Outpatient Encounter Prescriptions as of 06/02/2011  Medication Sig Dispense Refill  . albuterol (PROAIR HFA) 108 (90 BASE) MCG/ACT inhaler Inhale 2 puffs into the lungs every 4 (four)  hours as needed.        . budesonide (PULMICORT) 0.25 MG/2ML nebulizer solution Take 2 mLs (0.25 mg total) by nebulization 2 (two) times daily.  120 mL  12  . calcium carbonate (OS-CAL) 600 MG TABS Take 600 mg by mouth daily.        . Cholecalciferol (VITAMIN D3) 1000 UNITS CAPS Take 1 capsule by mouth daily.        . formoterol (PERFOROMIST) 20 MCG/2ML nebulizer solution Take 2 mLs (20 mcg total) by nebulization 2 (two) times daily.  120 mL  6  . montelukast (SINGULAIR) 10 MG tablet Take 1 tablet (10 mg total) by mouth daily.  30 tablet  6  . Multiple Vitamin (MULTIVITAMIN) capsule Take 1 capsule by mouth daily.        Marland Kitchen omeprazole (PRILOSEC) 20 MG capsule Take 1 capsule (20 mg total) by mouth daily.  90 capsule  3  . predniSONE (DELTASONE) 10 MG tablet Take 1 tablet (10 mg total) by mouth daily. Take 4 for four days 3 for four days then 2 daily  60 tablet  6  . traZODone (DESYREL) 50 MG tablet Take 50 mg by mouth at bedtime as needed.       . triamterene-hydrochlorothiazide (MAXZIDE-25) 37.5-25 MG per tablet 1 tablet in the morning as needed ankle swelling       . DISCONTD: predniSONE (DELTASONE) 10 MG tablet Take 4 for four days 3 for four days then 2 daily  60 tablet  6  . DISCONTD: Guaifenesin (MUCINEX MAXIMUM STRENGTH) 1200 MG TB12 Take 1 tablet by mouth 2 (two) times daily.

## 2011-07-10 ENCOUNTER — Encounter: Payer: Self-pay | Admitting: Critical Care Medicine

## 2011-07-10 ENCOUNTER — Ambulatory Visit (INDEPENDENT_AMBULATORY_CARE_PROVIDER_SITE_OTHER): Payer: Medicare Other | Admitting: Critical Care Medicine

## 2011-07-10 DIAGNOSIS — J45909 Unspecified asthma, uncomplicated: Secondary | ICD-10-CM | POA: Diagnosis not present

## 2011-07-10 NOTE — Assessment & Plan Note (Signed)
Moderate persistent asthma, atopy, passive smoke exposure HFA technique only 30% up to 50% after retraining 5/12>>>only 50% with more training 12/23/10>>>75% 02/12/11 >>>20% 03/05/11 D/C HFAs 03/05/11, start BD Neb Meds d/t poor HFA technique PFTs 5/18: FeV1 94%  Fef 25 75 55%   >>>66% post BD Maintaining Prednisone 10mg /d for maintenance RX 06/02/11  Severe persistent asthma improved chronic maintenance oral prednisone, now off inhaled meds Plan Maintain prednisone as prescribed at 10 mg daily Ok to hold  inhaled medications except PRN SABA for now

## 2011-07-10 NOTE — Progress Notes (Signed)
Subjective:    Patient ID: Darlene Murray, female    DOB: May 22, 1922, 78 y.o.   MRN: [id number]  HPI  78 y.o. AAF with Moderate persistent asthma, Steroid dependent, atopic, GERD as ppt factors. Note HFA technique only at 30%    11/26 No real nasal drip.  No cough now.  No mucus.  Not as dypsneic. Pt denies any significant sore throat, nasal congestion or excess secretions, fever, chills, sweats, unintended weight loss, pleurtic or exertional chest pain, orthopnea PND, or leg swelling Pt denies any increase in rescue therapy over baseline, denies waking up needing it or having any early am or nocturnal exacerbations of coughing/wheezing/or dyspnea. Pt also denies any obvious fluctuation in symptoms with  weather or environmental change or other alleviating or aggravating factors  07/10/2011 Doing better.  No real cough.  No real chest pain.  No wheezing.  See asthma assessment below. Pt denies any significant sore throat, nasal congestion or excess secretions, fever, chills, sweats, unintended weight loss, pleurtic or exertional chest pain, orthopnea PND, or leg swelling Pt denies any increase in rescue therapy over baseline, denies waking up needing it or having any early am or nocturnal exacerbations of coughing/wheezing/or dyspnea. Pt also denies any obvious fluctuation in symptoms with  weather or environmental change or other alleviating or aggravating factors     PUL ASTHMA HISTORY 07/10/2011 06/02/2011 05/08/2011 03/20/2011 03/05/2011  Symptoms 0-2 days/week 0-2 days/week Daily >2 days/week Daily  Nighttime awakenings 0-2/month 0-2/month >1/wk but not nightly 0-2/month 0-2/month  Interference with activity No limitations No limitations Some limitations Minor limitations Some limitations  SABA use 0-2 days/wk 0-2 days/wk 0-2 days/wk 0-2 days/wk Daily  Exacerbations requiring oral steroids 0-1 / year 2 or more / year 2 or more / year 0-1 / year 0-1 / year     Past Medical History    Diagnosis Date  . Blindness   . Asthmatic bronchitis   . Asthma   . Fibrocystic breast disease   . Allergic rhinitis   . Hyperlipidemia   . Venous insufficiency     lower extremities  . HNP (herniated nucleus pulposus), lumbar     L4-L5  . Left ventricular hypertrophy 2008    mild  . Antibiotic-associated diarrhea      Family History  Problem Relation Age of Onset  . Heart attack Sister   . Alzheimer's disease Sister   . Leukemia Mother   . Asthma Son      History   Social History  . Marital Status: Married    Spouse Name: N/A    Number of Children: 3  . Years of Education: N/A   Occupational History  . Retired     Runner, broadcasting/film/video   Social History Main Topics  . Smoking status: Never Smoker   . Smokeless tobacco: Never Used  . Alcohol Use: Yes     wine occasionally  . Drug Use: No  . Sexually Active: Yes -- Female partner(s)   Other Topics Concern  . Not on file   Social History Narrative  . No narrative on file     No Known Allergies   Outpatient Prescriptions Prior to Visit  Medication Sig Dispense Refill  . albuterol (PROAIR HFA) 108 (90 BASE) MCG/ACT inhaler Inhale 2 puffs into the lungs every 4 (four) hours as needed.        . calcium carbonate (OS-CAL) 600 MG TABS Take 600 mg by mouth daily.        . Cholecalciferol (  VITAMIN D3) 1000 UNITS CAPS Take 1 capsule by mouth daily.        . Multiple Vitamin (MULTIVITAMIN) capsule Take 1 capsule by mouth daily.        Marland Kitchen omeprazole (PRILOSEC) 20 MG capsule Take 1 capsule (20 mg total) by mouth daily.  90 capsule  3  . predniSONE (DELTASONE) 10 MG tablet Take 1 tablet (10 mg total) by mouth daily. Take 4 for four days 3 for four days then 2 daily  60 tablet  6  . traZODone (DESYREL) 50 MG tablet Take 50 mg by mouth at bedtime as needed.       . triamterene-hydrochlorothiazide (MAXZIDE-25) 37.5-25 MG per tablet 1 tablet in the morning as needed ankle swelling       . budesonide (PULMICORT) 0.25 MG/2ML nebulizer  solution Take 2 mLs (0.25 mg total) by nebulization 2 (two) times daily.  120 mL  12  . formoterol (PERFOROMIST) 20 MCG/2ML nebulizer solution Take 2 mLs (20 mcg total) by nebulization 2 (two) times daily.  120 mL  6  . montelukast (SINGULAIR) 10 MG tablet Take 1 tablet (10 mg total) by mouth daily.  30 tablet  6       Review of Systems Constitutional:   No  weight loss, night sweats,  Fevers, chills, fatigue, lassitude. HEENT:   No headaches,  Difficulty swallowing,  Tooth/dental problems,  Sore throat,                No sneezing, itching, ear ache, nasal congestion, post nasal drip,   CV:  No chest pain,  Orthopnea, PND, swelling in lower extremities, anasarca, dizziness, palpitations  GI  No heartburn, indigestion, abdominal pain, nausea, vomiting, diarrhea, change in bowel habits, loss of appetite  Resp: Notes  shortness of breath with exertion not  at rest.  Notes  excess mucus, no productive cough,  No non-productive cough,  No coughing up of blood.  Notes  change in color of mucus.  No wheezing.  No chest wall deformity  Skin: no rash or lesions.  GU: no dysuria, change in color of urine, no urgency or frequency.  No flank pain.  MS:  No joint pain or swelling.  No decreased range of motion.  No back pain.  Psych:  No change in mood or affect. No depression or anxiety.  No memory loss.     Objective:   Physical Exam  Filed Vitals:   07/10/11 0924  BP: 138/82  Pulse: 113  Temp: 97.6 F (36.4 C)  TempSrc: Oral  Height: 5\' 3"  (1.6 m)  Weight: 182 lb (82.555 kg)  SpO2: 96%    Gen: Pleasant, well-nourished, in no distress,  normal affect  ENT: No lesions,  mouth clear,  oropharynx clear, no postnasal drip  Neck: No JVD, no TMG, no carotid bruits  Lungs: No use of accessory muscles, no dullness to percussion, no rhonchi , no wheezes  Cardiovascular: RRR, heart sounds normal, no murmur or gallops, no peripheral edema  Abdomen: soft and NT, no HSM,  BS  normal  Musculoskeletal: No deformities, no cyanosis or clubbing  Neuro: alert, non focal  Skin: Warm, no lesions or rashes        Assessment & Plan:   Asthma with allergic rhinitis Moderate persistent asthma, atopy, passive smoke exposure HFA technique only 30% up to 50% after retraining 5/12>>>only 50% with more training 12/23/10>>>75% 02/12/11 >>>20% 03/05/11 D/C HFAs 03/05/11, start BD Neb Meds d/t poor HFA technique PFTs 5/18:  FeV1 94%  Fef 25 75 55%   >>>66% post BD Maintaining Prednisone 10mg /d for maintenance RX 06/02/11  Severe persistent asthma improved chronic maintenance oral prednisone, now off inhaled meds Plan Maintain prednisone as prescribed at 10 mg daily Ok to hold  inhaled medications except PRN SABA for now      Flu vaccine today Updated Medication List Outpatient Encounter Prescriptions as of 07/10/2011  Medication Sig Dispense Refill  . albuterol (PROAIR HFA) 108 (90 BASE) MCG/ACT inhaler Inhale 2 puffs into the lungs every 4 (four) hours as needed.        . calcium carbonate (OS-CAL) 600 MG TABS Take 600 mg by mouth daily.        . Cholecalciferol (VITAMIN D3) 1000 UNITS CAPS Take 1 capsule by mouth daily.        . Multiple Vitamin (MULTIVITAMIN) capsule Take 1 capsule by mouth daily.        Marland Kitchen omeprazole (PRILOSEC) 20 MG capsule Take 1 capsule (20 mg total) by mouth daily.  90 capsule  3  . predniSONE (DELTASONE) 10 MG tablet Take 1 tablet (10 mg total) by mouth daily. Take 4 for four days 3 for four days then 2 daily  60 tablet  6  . traZODone (DESYREL) 50 MG tablet Take 50 mg by mouth at bedtime as needed.       . triamterene-hydrochlorothiazide (MAXZIDE-25) 37.5-25 MG per tablet 1 tablet in the morning as needed ankle swelling       . DISCONTD: budesonide (PULMICORT) 0.25 MG/2ML nebulizer solution Take 2 mLs (0.25 mg total) by nebulization 2 (two) times daily.  120 mL  12  . DISCONTD: formoterol (PERFOROMIST) 20 MCG/2ML nebulizer solution Take 2 mLs (20  mcg total) by nebulization 2 (two) times daily.  120 mL  6  . DISCONTD: montelukast (SINGULAIR) 10 MG tablet Take 1 tablet (10 mg total) by mouth daily.  30 tablet  6

## 2011-07-10 NOTE — Patient Instructions (Signed)
Ok to be off nebulizer meds for now Prednisone 10mg  daily Return 2 months

## 2011-07-18 DIAGNOSIS — L259 Unspecified contact dermatitis, unspecified cause: Secondary | ICD-10-CM | POA: Diagnosis not present

## 2011-07-18 DIAGNOSIS — L089 Local infection of the skin and subcutaneous tissue, unspecified: Secondary | ICD-10-CM | POA: Diagnosis not present

## 2011-07-18 DIAGNOSIS — D485 Neoplasm of uncertain behavior of skin: Secondary | ICD-10-CM | POA: Diagnosis not present

## 2011-08-08 DIAGNOSIS — L259 Unspecified contact dermatitis, unspecified cause: Secondary | ICD-10-CM | POA: Diagnosis not present

## 2011-09-15 ENCOUNTER — Ambulatory Visit (INDEPENDENT_AMBULATORY_CARE_PROVIDER_SITE_OTHER): Payer: Medicare Other | Admitting: Critical Care Medicine

## 2011-09-15 ENCOUNTER — Encounter: Payer: Self-pay | Admitting: Critical Care Medicine

## 2011-09-15 DIAGNOSIS — J45909 Unspecified asthma, uncomplicated: Secondary | ICD-10-CM | POA: Diagnosis not present

## 2011-09-15 NOTE — Progress Notes (Signed)
Subjective:    Patient ID: Darlene Murray, female    DOB: 1922/03/13, 78 y.o.   MRN: [id number]  HPI  78 y.o. AAF with Moderate persistent asthma, Steroid dependent, atopic, GERD as ppt factors. Note HFA technique only at 30%    11/26 No real nasal drip.  No cough now.  No mucus.  Not as dypsneic. Pt denies any significant sore throat, nasal congestion or excess secretions, fever, chills, sweats, unintended weight loss, pleurtic or exertional chest pain, orthopnea PND, or leg swelling Pt denies any increase in rescue therapy over baseline, denies waking up needing it or having any early am or nocturnal exacerbations of coughing/wheezing/or dyspnea. Pt also denies any obvious fluctuation in symptoms with  weather or environmental change or other alleviating or aggravating factors  1/3 Doing better.  No real cough.  No real chest pain.  No wheezing.  See asthma assessment below. Pt denies any significant sore throat, nasal congestion or excess secretions, fever, chills, sweats, unintended weight loss, pleurtic or exertional chest pain, orthopnea PND, or leg swelling Pt denies any increase in rescue therapy over baseline, denies waking up needing it or having any early am or nocturnal exacerbations of coughing/wheezing/or dyspnea. Pt also denies any obvious fluctuation in symptoms with  weather or environmental change or other alleviating or aggravating factors  09/15/2011  No use of rescue inhaler. No real cough.  No chest pain or wheezing.    Pt denies any significant sore throat, nasal congestion or excess secretions, fever, chills, sweats, unintended weight loss, pleurtic or exertional chest pain, orthopnea PND, or leg swelling Pt denies any increase in rescue therapy over baseline, denies waking up needing it or having any early am or nocturnal exacerbations of coughing/wheezing/or dyspnea. Pt also denies any obvious fluctuation in symptoms with  weather or environmental change or other  alleviating or aggravating factors      PUL ASTHMA HISTORY 09/15/2011 07/10/2011 06/02/2011 05/08/2011 03/20/2011  Symptoms 0-2 days/week 0-2 days/week 0-2 days/week Daily >2 days/week  Nighttime awakenings 0-2/month 0-2/month 0-2/month >1/wk but not nightly 0-2/month  Interference with activity No limitations No limitations No limitations Some limitations Minor limitations  SABA use 0-2 days/wk 0-2 days/wk 0-2 days/wk 0-2 days/wk 0-2 days/wk  Exacerbations requiring oral steroids 0-1 / year 0-1 / year 2 or more / year 2 or more / year 0-1 / year     Past Medical History  Diagnosis Date  . Blindness   . Asthmatic bronchitis   . Asthma   . Fibrocystic breast disease   . Allergic rhinitis   . Hyperlipidemia   . Venous insufficiency     lower extremities  . HNP (herniated nucleus pulposus), lumbar     L4-L5  . Left ventricular hypertrophy 2008    mild  . Antibiotic-associated diarrhea      Family History  Problem Relation Age of Onset  . Heart attack Sister   . Alzheimer's disease Sister   . Leukemia Mother   . Asthma Son      History   Social History  . Marital Status: Married    Spouse Name: N/A    Number of Children: 3  . Years of Education: N/A   Occupational History  . Retired     Runner, broadcasting/film/video   Social History Main Topics  . Smoking status: Never Smoker   . Smokeless tobacco: Never Used  . Alcohol Use: Yes     wine occasionally  . Drug Use: No  . Sexually Active: Yes --  Female partner(s)   Other Topics Concern  . Not on file   Social History Narrative  . No narrative on file     No Known Allergies   Outpatient Prescriptions Prior to Visit  Medication Sig Dispense Refill  . albuterol (PROAIR HFA) 108 (90 BASE) MCG/ACT inhaler Inhale 2 puffs into the lungs every 4 (four) hours as needed.        . calcium carbonate (OS-CAL) 600 MG TABS Take 600 mg by mouth daily.        . Cholecalciferol (VITAMIN D3) 1000 UNITS CAPS Take 1 capsule by mouth daily.          . Multiple Vitamin (MULTIVITAMIN) capsule Take 1 capsule by mouth daily.        . traZODone (DESYREL) 50 MG tablet Take 50 mg by mouth at bedtime as needed.       . triamterene-hydrochlorothiazide (MAXZIDE-25) 37.5-25 MG per tablet 1 tablet in the morning as needed ankle swelling       . predniSONE (DELTASONE) 10 MG tablet Take 1 tablet (10 mg total) by mouth daily. Take 4 for four days 3 for four days then 2 daily  60 tablet  6  . omeprazole (PRILOSEC) 20 MG capsule Take 1 capsule (20 mg total) by mouth daily.  90 capsule  3       Review of Systems Constitutional:   No  weight loss, night sweats,  Fevers, chills, fatigue, lassitude. HEENT:   No headaches,  Difficulty swallowing,  Tooth/dental problems,  Sore throat,                No sneezing, itching, ear ache, nasal congestion, post nasal drip,   CV:  No chest pain,  Orthopnea, PND, swelling in lower extremities, anasarca, dizziness, palpitations  GI  No heartburn, indigestion, abdominal pain, nausea, vomiting, diarrhea, change in bowel habits, loss of appetite  Resp: Notes  shortness of breath with exertion not  at rest.  Notes  excess mucus, no productive cough,  No non-productive cough,  No coughing up of blood.  Notes  change in color of mucus.  No wheezing.  No chest wall deformity  Skin: no rash or lesions.  GU: no dysuria, change in color of urine, no urgency or frequency.  No flank pain.  MS:  No joint pain or swelling.  No decreased range of motion.  No back pain.  Psych:  No change in mood or affect. No depression or anxiety.  No memory loss.     Objective:   Physical Exam  Filed Vitals:   09/15/11 0900  BP: 112/76  Pulse: 77  Temp: 97.8 F (36.6 C)  TempSrc: Oral  Height: 5\' 3"  (1.6 m)  Weight: 191 lb (86.637 kg)  SpO2: 98%    Gen: Pleasant, well-nourished, in no distress,  normal affect  ENT: No lesions,  mouth clear,  oropharynx clear, no postnasal drip  Neck: No JVD, no TMG, no carotid  bruits  Lungs: No use of accessory muscles, no dullness to percussion, no rhonchi , no wheezes  Cardiovascular: RRR, heart sounds normal, no murmur or gallops, no peripheral edema  Abdomen: soft and NT, no HSM,  BS normal  Musculoskeletal: No deformities, no cyanosis or clubbing  Neuro: alert, non focal  Skin: Warm, no lesions or rashes        Assessment & Plan:   Asthma with allergic rhinitis Moderate persistent asthma improved on chronic oral steroids. Pt unable to tolerate BD neb meds  and unable to use HFA device Plan Cont prednisone at 10mg  /d Prn SABA Rov 3 months    Flu vaccine today Updated Medication List Outpatient Encounter Prescriptions as of 09/15/2011  Medication Sig Dispense Refill  . albuterol (PROAIR HFA) 108 (90 BASE) MCG/ACT inhaler Inhale 2 puffs into the lungs every 4 (four) hours as needed.        . calcium carbonate (OS-CAL) 600 MG TABS Take 600 mg by mouth daily.        . Cholecalciferol (VITAMIN D3) 1000 UNITS CAPS Take 1 capsule by mouth daily.        . Multiple Vitamin (MULTIVITAMIN) capsule Take 1 capsule by mouth daily.        . predniSONE (DELTASONE) 10 MG tablet Take 10 mg by mouth daily.      . traZODone (DESYREL) 50 MG tablet Take 50 mg by mouth at bedtime as needed.       . triamterene-hydrochlorothiazide (MAXZIDE-25) 37.5-25 MG per tablet 1 tablet in the morning as needed ankle swelling       . DISCONTD: predniSONE (DELTASONE) 10 MG tablet Take 1 tablet (10 mg total) by mouth daily. Take 4 for four days 3 for four days then 2 daily  60 tablet  6  . omeprazole (PRILOSEC) 20 MG capsule Take 1 capsule (20 mg total) by mouth daily.  90 capsule  3

## 2011-09-15 NOTE — Patient Instructions (Signed)
No change in medications. Return in   3 months 

## 2011-09-15 NOTE — Assessment & Plan Note (Signed)
Moderate persistent asthma improved on chronic oral steroids. Pt unable to tolerate BD neb meds and unable to use HFA device Plan Cont prednisone at 10mg  /d Prn SABA Rov 3 months

## 2011-09-26 DIAGNOSIS — H2510 Age-related nuclear cataract, unspecified eye: Secondary | ICD-10-CM | POA: Diagnosis not present

## 2011-10-24 DIAGNOSIS — Z0000 Encounter for general adult medical examination without abnormal findings: Secondary | ICD-10-CM | POA: Diagnosis not present

## 2011-10-24 DIAGNOSIS — I10 Essential (primary) hypertension: Secondary | ICD-10-CM | POA: Diagnosis not present

## 2011-10-24 DIAGNOSIS — R82998 Other abnormal findings in urine: Secondary | ICD-10-CM | POA: Diagnosis not present

## 2011-10-24 DIAGNOSIS — J45909 Unspecified asthma, uncomplicated: Secondary | ICD-10-CM | POA: Diagnosis not present

## 2011-10-24 DIAGNOSIS — K219 Gastro-esophageal reflux disease without esophagitis: Secondary | ICD-10-CM | POA: Diagnosis not present

## 2011-10-24 DIAGNOSIS — Z79899 Other long term (current) drug therapy: Secondary | ICD-10-CM | POA: Diagnosis not present

## 2011-10-24 DIAGNOSIS — H6120 Impacted cerumen, unspecified ear: Secondary | ICD-10-CM | POA: Diagnosis not present

## 2011-11-05 DIAGNOSIS — Z13820 Encounter for screening for osteoporosis: Secondary | ICD-10-CM | POA: Diagnosis not present

## 2011-11-05 DIAGNOSIS — Z1231 Encounter for screening mammogram for malignant neoplasm of breast: Secondary | ICD-10-CM | POA: Diagnosis not present

## 2011-11-05 DIAGNOSIS — IMO0002 Reserved for concepts with insufficient information to code with codable children: Secondary | ICD-10-CM | POA: Diagnosis not present

## 2011-12-11 ENCOUNTER — Encounter: Payer: Self-pay | Admitting: Critical Care Medicine

## 2011-12-11 ENCOUNTER — Ambulatory Visit (INDEPENDENT_AMBULATORY_CARE_PROVIDER_SITE_OTHER): Payer: Medicare Other | Admitting: Critical Care Medicine

## 2011-12-11 VITALS — BP 112/72 | HR 66 | Temp 97.9°F | Ht 63.0 in | Wt 191.0 lb

## 2011-12-11 DIAGNOSIS — J45909 Unspecified asthma, uncomplicated: Secondary | ICD-10-CM | POA: Diagnosis not present

## 2011-12-11 MED ORDER — PREDNISONE 10 MG PO TABS: 10.0000 mg | ORAL_TABLET | Freq: Every day | ORAL | Status: DC

## 2011-12-11 NOTE — Progress Notes (Signed)
Subjective:    Patient ID: Darlene Murray, female    DOB: [birth year], 78 y.o.   MRN: [id number]  HPI  78 y.o. AAF with Moderate persistent asthma, Steroid dependent, atopic, GERD as ppt factors. Note HFA technique only at 30%     09/15/2011  No use of rescue inhaler. No real cough.  No chest pain or wheezing.    Pt denies any significant sore throat, nasal congestion or excess secretions, fever, chills, sweats, unintended weight loss, pleurtic or exertional chest pain, orthopnea PND, or leg swelling Pt denies any increase in rescue therapy over baseline, denies waking up needing it or having any early am or nocturnal exacerbations of coughing/wheezing/or dyspnea. Pt also denies any obvious fluctuation in symptoms with  weather or environmental change or other alleviating or aggravating factors   12/11/2011 Pt still coughing but did well during spring weather.  No new pulmonary complaints. The patient maintains prednisone one daily Pt denies any significant sore throat, nasal congestion or excess secretions, fever, chills, sweats, unintended weight loss, pleurtic or exertional chest pain, orthopnea PND, or leg swelling Pt denies any increase in rescue therapy over baseline, denies waking up needing it or having any early am or nocturnal exacerbations of coughing/wheezing/or dyspnea. Pt also denies any obvious fluctuation in symptoms with  weather or environmental change or other alleviating or aggravating factors      PUL ASTHMA HISTORY 12/11/2011 09/15/2011 07/10/2011 06/02/2011 05/08/2011  Symptoms >2 days/week 0-2 days/week 0-2 days/week 0-2 days/week Daily  Nighttime awakenings 0-2/month 0-2/month 0-2/month 0-2/month >1/wk but not nightly  Interference with activity Minor limitations No limitations No limitations No limitations Some limitations  SABA use 0-2 days/wk 0-2 days/wk 0-2 days/wk 0-2 days/wk 0-2 days/wk  Exacerbations requiring oral steroids 0-1 / year 0-1 / year 0-1 / year 2 or  more / year 2 or more / year     Past Medical History  Diagnosis Date  . Blindness   . Asthmatic bronchitis   . Asthma   . Fibrocystic breast disease   . Allergic rhinitis   . Hyperlipidemia   . Venous insufficiency     lower extremities  . HNP (herniated nucleus pulposus), lumbar     L4-L5  . Left ventricular hypertrophy 2008    mild  . Antibiotic-associated diarrhea      Family History  Problem Relation Age of Onset  . Heart attack Sister   . Alzheimer's disease Sister   . Leukemia Mother   . Asthma Son      History   Social History  . Marital Status: Married    Spouse Name: N/A    Number of Children: 3  . Years of Education: N/A   Occupational History  . Retired     Runner, broadcasting/film/video   Social History Main Topics  . Smoking status: Never Smoker   . Smokeless tobacco: Never Used  . Alcohol Use: Yes     wine occasionally  . Drug Use: No  . Sexually Active: Yes -- Female partner(s)   Other Topics Concern  . Not on file   Social History Narrative  . No narrative on file     No Known Allergies   Outpatient Prescriptions Prior to Visit  Medication Sig Dispense Refill  . albuterol (PROAIR HFA) 108 (90 BASE) MCG/ACT inhaler Inhale 2 puffs into the lungs every 4 (four) hours as needed.        . calcium carbonate (OS-CAL) 600 MG TABS Take 600 mg by mouth daily.        Marland Kitchen  Cholecalciferol (VITAMIN D3) 1000 UNITS CAPS Take 1 capsule by mouth daily.        . Multiple Vitamin (MULTIVITAMIN) capsule Take 1 capsule by mouth daily.        . traZODone (DESYREL) 50 MG tablet Take 50 mg by mouth at bedtime as needed.       . triamterene-hydrochlorothiazide (MAXZIDE-25) 37.5-25 MG per tablet 1 tablet in the morning as needed ankle swelling       . omeprazole (PRILOSEC) 20 MG capsule Take 1 capsule (20 mg total) by mouth daily.  90 capsule  3  . predniSONE (DELTASONE) 10 MG tablet Take 10 mg by mouth daily.           Review of Systems Constitutional:   No  weight loss, night  sweats,  Fevers, chills, fatigue, lassitude. HEENT:   No headaches,  Difficulty swallowing,  Tooth/dental problems,  Sore throat,                No sneezing, itching, ear ache, nasal congestion, post nasal drip,   CV:  No chest pain,  Orthopnea, PND, swelling in lower extremities, anasarca, dizziness, palpitations  GI  No heartburn, indigestion, abdominal pain, nausea, vomiting, diarrhea, change in bowel habits, loss of appetite  Resp: Notes  shortness of breath with exertion not  at rest.  Notes  excess mucus, no productive cough,  No non-productive cough,  No coughing up of blood.  Notes  change in color of mucus.  No wheezing.  No chest wall deformity  Skin: no rash or lesions.  GU: no dysuria, change in color of urine, no urgency or frequency.  No flank pain.  MS:  No joint pain or swelling.  No decreased range of motion.  No back pain.  Psych:  No change in mood or affect. No depression or anxiety.  No memory loss.     Objective:   Physical Exam  Filed Vitals:   12/11/11 1403  BP: 112/72  Pulse: 66  Temp: 97.9 F (36.6 C)  TempSrc: Oral  Height: 5\' 3"  (1.6 m)  Weight: 191 lb (86.637 kg)  SpO2: 99%    Gen: Pleasant, well-nourished, in no distress,  normal affect  ENT: No lesions,  mouth clear,  oropharynx clear, no postnasal drip  Neck: No JVD, no TMG, no carotid bruits  Lungs: No use of accessory muscles, no dullness to percussion, no rhonchi , no wheezes  Cardiovascular: RRR, heart sounds normal, no murmur or gallops, no peripheral edema  Abdomen: soft and NT, no HSM,  BS normal  Musculoskeletal: No deformities, no cyanosis or clubbing  Neuro: alert, non focal  Skin: Warm, no lesions or rashes        Assessment & Plan:   Asthma with allergic rhinitis Severe persistent asthma with inability to use bronchodilator and inhaled medications due to poor HFA technique now improved on maintenance prednisone starting November 2012 Plan Maintain prednisone 10  mg daily   as needed albuterol Return 4 months    Flu vaccine today Updated Medication List Outpatient Encounter Prescriptions as of 12/11/2011  Medication Sig Dispense Refill  . albuterol (PROAIR HFA) 108 (90 BASE) MCG/ACT inhaler Inhale 2 puffs into the lungs every 4 (four) hours as needed.        . calcium carbonate (OS-CAL) 600 MG TABS Take 600 mg by mouth daily.        . Cholecalciferol (VITAMIN D3) 1000 UNITS CAPS Take 1 capsule by mouth daily.        Marland Kitchen  Multiple Vitamin (MULTIVITAMIN) capsule Take 1 capsule by mouth daily.        Marland Kitchen omeprazole (PRILOSEC) 20 MG capsule Take 20 mg by mouth as needed.      . predniSONE (DELTASONE) 10 MG tablet Take 1 tablet (10 mg total) by mouth daily.  100 tablet  6  . traZODone (DESYREL) 50 MG tablet Take 50 mg by mouth at bedtime as needed.       . triamterene-hydrochlorothiazide (MAXZIDE-25) 37.5-25 MG per tablet 1 tablet in the morning as needed ankle swelling       . DISCONTD: omeprazole (PRILOSEC) 20 MG capsule Take 1 capsule (20 mg total) by mouth daily.  90 capsule  3  . DISCONTD: predniSONE (DELTASONE) 10 MG tablet Take 10 mg by mouth daily.

## 2011-12-11 NOTE — Assessment & Plan Note (Signed)
Severe persistent asthma with inability to use bronchodilator and inhaled medications due to poor HFA technique now improved on maintenance prednisone starting November 2012 Plan Maintain prednisone 10 mg daily   as needed albuterol Return 4 months

## 2011-12-11 NOTE — Patient Instructions (Signed)
Stay on prednisone one daily Return 4 months

## 2012-02-23 ENCOUNTER — Encounter: Payer: Self-pay | Admitting: Critical Care Medicine

## 2012-02-23 ENCOUNTER — Ambulatory Visit (INDEPENDENT_AMBULATORY_CARE_PROVIDER_SITE_OTHER): Payer: Medicare Other | Admitting: Critical Care Medicine

## 2012-02-23 VITALS — BP 160/90 | HR 82 | Temp 97.9°F | Ht 63.0 in | Wt 189.0 lb

## 2012-02-23 DIAGNOSIS — J45909 Unspecified asthma, uncomplicated: Secondary | ICD-10-CM | POA: Diagnosis not present

## 2012-02-23 MED ORDER — PREDNISONE 10 MG PO TABS: ORAL_TABLET | ORAL | Status: DC

## 2012-02-23 MED ORDER — AZITHROMYCIN 250 MG PO TABS: 250.0000 mg | ORAL_TABLET | Freq: Every day | ORAL | Status: AC

## 2012-02-23 NOTE — Assessment & Plan Note (Signed)
Severe persistent asthma steroid dependent with flare Plan Pulse prednisone 10mg  Take 4 for four days 3 for four days 2 for four days then 1 and stay Azithromycin 250mg  Take two once then one daily until gone Prn SABA

## 2012-02-23 NOTE — Patient Instructions (Signed)
Use ventolin as needed Prednisone 10mg  Take 4 for four days 3 for four days 2 for four days then one daily Azithromycin 250mg  Take two once then one daily until gone Return 6 weeks

## 2012-02-23 NOTE — Progress Notes (Signed)
Subjective:    Patient ID: Darlene Murray, female    DOB: 1921-12-16, 78 y.o.   MRN: [id number]  HPI  78 y.o. AAF with Moderate persistent asthma, Steroid dependent, atopic, GERD as ppt factors. Note HFA technique only at 30%     02/23/2012 Pt noting over the past week is worse on 10mg  /d pred.  Notes mild cough. Pt is wheezing more. No rescue inhaler use.  PUL ASTHMA HISTORY 02/23/2012 12/11/2011 09/15/2011 07/10/2011 06/02/2011  Symptoms Daily >2 days/week 0-2 days/week 0-2 days/week 0-2 days/week  Nighttime awakenings 0-2/month 0-2/month 0-2/month 0-2/month 0-2/month  Interference with activity Some limitations Minor limitations No limitations No limitations No limitations  SABA use 0-2 days/wk 0-2 days/wk 0-2 days/wk 0-2 days/wk 0-2 days/wk  Exacerbations requiring oral steroids 2 or more / year 0-1 / year 0-1 / year 0-1 / year 2 or more / year     Past Medical History  Diagnosis Date  . Blindness   . Asthmatic bronchitis   . Asthma   . Fibrocystic breast disease   . Allergic rhinitis   . Hyperlipidemia   . Venous insufficiency     lower extremities  . HNP (herniated nucleus pulposus), lumbar     L4-L5  . Left ventricular hypertrophy 2008    mild  . Antibiotic-associated diarrhea      Family History  Problem Relation Age of Onset  . Heart attack Sister   . Alzheimer's disease Sister   . Leukemia Mother   . Asthma Son      History   Social History  . Marital Status: Married    Spouse Name: N/A    Number of Children: 3  . Years of Education: N/A   Occupational History  . Retired     Runner, broadcasting/film/video   Social History Main Topics  . Smoking status: Never Smoker   . Smokeless tobacco: Never Used  . Alcohol Use: Yes     wine occasionally  . Drug Use: No  . Sexually Active: Yes -- Female partner(s)   Other Topics Concern  . Not on file   Social History Narrative  . No narrative on file     No Known Allergies   Outpatient Prescriptions Prior to Visit    Medication Sig Dispense Refill  . calcium carbonate (OS-CAL) 600 MG TABS Take 600 mg by mouth daily.        . Cholecalciferol (VITAMIN D3) 1000 UNITS CAPS Take 1 capsule by mouth daily.        . Multiple Vitamin (MULTIVITAMIN) capsule Take 1 capsule by mouth daily.        Marland Kitchen omeprazole (PRILOSEC) 20 MG capsule Take 20 mg by mouth as needed.      . traZODone (DESYREL) 50 MG tablet Take 50 mg by mouth at bedtime as needed.       . triamterene-hydrochlorothiazide (MAXZIDE-25) 37.5-25 MG per tablet 1 tablet in the morning as needed ankle swelling       . predniSONE (DELTASONE) 10 MG tablet Take 1 tablet (10 mg total) by mouth daily.  100 tablet  6  . albuterol (PROAIR HFA) 108 (90 BASE) MCG/ACT inhaler Inhale 2 puffs into the lungs every 4 (four) hours as needed.             Review of Systems Constitutional:   No  weight loss, night sweats,  Fevers, chills, fatigue, lassitude. HEENT:   No headaches,  Difficulty swallowing,  Tooth/dental problems,  Sore throat,  No sneezing, itching, ear ache, nasal congestion, post nasal drip,   CV:  No chest pain,  Orthopnea, PND, swelling in lower extremities, anasarca, dizziness, palpitations  GI  No heartburn, indigestion, abdominal pain, nausea, vomiting, diarrhea, change in bowel habits, loss of appetite  Resp: Notes  shortness of breath with exertion and  at rest.  Notes  Excess yellow  mucus, no productive cough,  No non-productive cough,  No coughing up of blood.  Notes  change in color of mucus.  No wheezing.  No chest wall deformity  Skin: no rash or lesions.  GU: no dysuria, change in color of urine, no urgency or frequency.  No flank pain.  MS:  No joint pain or swelling.  No decreased range of motion.  No back pain.  Psych:  No change in mood or affect. No depression or anxiety.  No memory loss.     Objective:   Physical Exam  Filed Vitals:   02/23/12 1502  BP: 160/90  Pulse: 82  Temp: 97.9 F (36.6 C)  TempSrc:  Oral  Height: 5\' 3"  (1.6 m)  Weight: 189 lb (85.73 kg)  SpO2: 99%    Gen: Pleasant, well-nourished, in no distress,  normal affect  ENT: No lesions,  mouth clear,  oropharynx clear, no postnasal drip  Neck: No JVD, no TMG, no carotid bruits  Lungs: No use of accessory muscles, no dullness to percussion,scattered  rhonchi , exp wheezes  Cardiovascular: RRR, heart sounds normal, no murmur or gallops, no peripheral edema  Abdomen: soft and NT, no HSM,  BS normal  Musculoskeletal: No deformities, no cyanosis or clubbing  Neuro: alert, non focal  Skin: Warm, no lesions or rashes        Assessment & Plan:   Asthma with allergic rhinitis Severe persistent asthma steroid dependent with flare Plan Pulse prednisone 10mg  Take 4 for four days 3 for four days 2 for four days then 1 and stay Azithromycin 250mg  Take two once then one daily until gone Prn SABA    Flu vaccine today Updated Medication List Outpatient Encounter Prescriptions as of 02/23/2012  Medication Sig Dispense Refill  . calcium carbonate (OS-CAL) 600 MG TABS Take 600 mg by mouth daily.        . Cholecalciferol (VITAMIN D3) 1000 UNITS CAPS Take 1 capsule by mouth daily.        . Multiple Vitamin (MULTIVITAMIN) capsule Take 1 capsule by mouth daily.        Marland Kitchen omeprazole (PRILOSEC) 20 MG capsule Take 20 mg by mouth as needed.      . predniSONE (DELTASONE) 10 MG tablet Take 4 for four days 3 for four days 2 for four days then one daily  40 tablet  0  . traZODone (DESYREL) 50 MG tablet Take 50 mg by mouth at bedtime as needed.       . triamterene-hydrochlorothiazide (MAXZIDE-25) 37.5-25 MG per tablet 1 tablet in the morning as needed ankle swelling       . DISCONTD: predniSONE (DELTASONE) 10 MG tablet Take 1 tablet (10 mg total) by mouth daily.  100 tablet  6  . DISCONTD: predniSONE (DELTASONE) 10 MG tablet Take 4 for four days 3 for four days 2 for four days then one daily  100 tablet  6  . albuterol (PROAIR HFA) 108  (90 BASE) MCG/ACT inhaler Inhale 2 puffs into the lungs every 4 (four) hours as needed.        Marland Kitchen azithromycin (ZITHROMAX) 250  MG tablet Take 1 tablet (250 mg total) by mouth daily. Take two once then one daily until gone  6 each  0

## 2012-03-31 DIAGNOSIS — Z23 Encounter for immunization: Secondary | ICD-10-CM | POA: Diagnosis not present

## 2012-04-08 ENCOUNTER — Ambulatory Visit (INDEPENDENT_AMBULATORY_CARE_PROVIDER_SITE_OTHER): Payer: Medicare Other | Admitting: Critical Care Medicine

## 2012-04-08 ENCOUNTER — Encounter: Payer: Self-pay | Admitting: Critical Care Medicine

## 2012-04-08 VITALS — BP 110/64 | HR 89 | Temp 97.9°F | Ht 63.0 in | Wt 194.0 lb

## 2012-04-08 DIAGNOSIS — J45909 Unspecified asthma, uncomplicated: Secondary | ICD-10-CM | POA: Diagnosis not present

## 2012-04-08 MED ORDER — PREDNISONE 10 MG PO TABS: 10.0000 mg | ORAL_TABLET | Freq: Every day | ORAL | Status: DC

## 2012-04-08 NOTE — Patient Instructions (Signed)
No change in medications. Return in   3 months 

## 2012-04-08 NOTE — Assessment & Plan Note (Signed)
Severe persistent asthma with significant atopic features and prior passive smoke exposure stable at this time Steroid dependent Plan Maintain prednisone 10 mg daily Maintain albuterol as needed Return 4 months

## 2012-04-08 NOTE — Progress Notes (Signed)
Subjective:    Patient ID: Darlene Murray, female    DOB: [birth year], 78 y.o.   MRN: [id number]  HPI  78 y.o. AAF with Moderate persistent asthma, Steroid dependent, atopic, GERD as ppt factors. Note HFA technique only at 30%     02/23/2012 Pt noting over the past week is worse on 10mg  /d pred.  Notes mild cough. Pt is wheezing more. No rescue inhaler use.  04/08/2012 Pt tried to stop prednisone and felt worse , adrenal insuff symptoms.  Now back to 10mg /d and doing better.  Notes mild cough at night. No mucus.  N oreal chest pain  Pt denies any significant sore throat, nasal congestion or excess secretions, fever, chills, sweats, unintended weight loss, pleurtic or exertional chest pain, orthopnea PND, or leg swelling Pt denies any increase in rescue therapy over baseline, denies waking up needing it or having any early am or nocturnal exacerbations of coughing/wheezing/or dyspnea. Pt also denies any obvious fluctuation in symptoms with  weather or environmental change or other alleviating or aggravating factors    PUL ASTHMA HISTORY 04/08/2012 02/23/2012 12/11/2011 09/15/2011 07/10/2011  Symptoms 0-2 days/week Daily >2 days/week 0-2 days/week 0-2 days/week  Nighttime awakenings 0-2/month 0-2/month 0-2/month 0-2/month 0-2/month  Interference with activity No limitations Some limitations Minor limitations No limitations No limitations  SABA use 0-2 days/wk 0-2 days/wk 0-2 days/wk 0-2 days/wk 0-2 days/wk  Exacerbations requiring oral steroids 0-1 / year 2 or more / year 0-1 / year 0-1 / year 0-1 / year     Past Medical History  Diagnosis Date  . Blindness   . Asthmatic bronchitis   . Asthma   . Fibrocystic breast disease   . Allergic rhinitis   . Hyperlipidemia   . Venous insufficiency     lower extremities  . HNP (herniated nucleus pulposus), lumbar     L4-L5  . Left ventricular hypertrophy 2008    mild  . Antibiotic-associated diarrhea      Family History  Problem Relation Age  of Onset  . Heart attack Sister   . Alzheimer's disease Sister   . Leukemia Mother   . Asthma Son      History   Social History  . Marital Status: Married    Spouse Name: N/A    Number of Children: 3  . Years of Education: N/A   Occupational History  . Retired     Runner, broadcasting/film/video   Social History Main Topics  . Smoking status: Never Smoker   . Smokeless tobacco: Never Used  . Alcohol Use: Yes     wine occasionally  . Drug Use: No  . Sexually Active: Yes -- Female partner(s)   Other Topics Concern  . Not on file   Social History Narrative  . No narrative on file     No Known Allergies   Outpatient Prescriptions Prior to Visit  Medication Sig Dispense Refill  . albuterol (PROAIR HFA) 108 (90 BASE) MCG/ACT inhaler Inhale 2 puffs into the lungs every 4 (four) hours as needed.        . calcium carbonate (OS-CAL) 600 MG TABS Take 600 mg by mouth daily.        . Cholecalciferol (VITAMIN D3) 1000 UNITS CAPS Take 1 capsule by mouth daily.        . Multiple Vitamin (MULTIVITAMIN) capsule Take 1 capsule by mouth daily.        . traZODone (DESYREL) 50 MG tablet Take 50 mg by mouth at bedtime as needed.       Marland Kitchen  triamterene-hydrochlorothiazide (MAXZIDE-25) 37.5-25 MG per tablet 1 tablet in the morning as needed ankle swelling       . predniSONE (DELTASONE) 10 MG tablet Take 4 for four days 3 for four days 2 for four days then one daily  40 tablet  0  . omeprazole (PRILOSEC) 20 MG capsule Take 20 mg by mouth as needed.           Review of Systems Constitutional:   No  weight loss, night sweats,  Fevers, chills, fatigue, lassitude. HEENT:   No headaches,  Difficulty swallowing,  Tooth/dental problems,  Sore throat,                No sneezing, itching, ear ache, nasal congestion, post nasal drip,   CV:  No chest pain,  Orthopnea, PND, swelling in lower extremities, anasarca, dizziness, palpitations  GI  No heartburn, indigestion, abdominal pain, nausea, vomiting, diarrhea, change in  bowel habits, loss of appetite  Resp: Notes  shortness of breath with exertion and  at rest.  Notes  Excess yellow  mucus, no productive cough,  No non-productive cough,  No coughing up of blood.  Notes  change in color of mucus.  No wheezing.  No chest wall deformity  Skin: no rash or lesions.  GU: no dysuria, change in color of urine, no urgency or frequency.  No flank pain.  MS:  No joint pain or swelling.  No decreased range of motion.  No back pain.  Psych:  No change in mood or affect. No depression or anxiety.  No memory loss.     Objective:   Physical Exam  Filed Vitals:   04/08/12 0905  BP: 110/64  Pulse: 89  Temp: 97.9 F (36.6 C)  TempSrc: Oral  Height: 5\' 3"  (1.6 m)  Weight: 194 lb (87.998 kg)  SpO2: 97%    Gen: Pleasant, well-nourished, in no distress,  normal affect  ENT: No lesions,  mouth clear,  oropharynx clear, no postnasal drip  Neck: No JVD, no TMG, no carotid bruits  Lungs: No use of accessory muscles, no dullness to percussion, no wheezes or rhonchi  Cardiovascular: RRR, heart sounds normal, no murmur or gallops, no peripheral edema  Abdomen: soft and NT, no HSM,  BS normal  Musculoskeletal: No deformities, no cyanosis or clubbing  Neuro: alert, non focal  Skin: Warm, no lesions or rashes        Assessment & Plan:   Asthma with allergic rhinitis Severe persistent asthma with significant atopic features and prior passive smoke exposure stable at this time Steroid dependent Plan Maintain prednisone 10 mg daily Maintain albuterol as needed Return 4 months   Flu vaccine today Updated Medication List Outpatient Encounter Prescriptions as of 04/08/2012  Medication Sig Dispense Refill  . albuterol (PROAIR HFA) 108 (90 BASE) MCG/ACT inhaler Inhale 2 puffs into the lungs every 4 (four) hours as needed.        . calcium carbonate (OS-CAL) 600 MG TABS Take 600 mg by mouth daily.        . Cholecalciferol (VITAMIN D3) 1000 UNITS CAPS Take 1  capsule by mouth daily.        . Multiple Vitamin (MULTIVITAMIN) capsule Take 1 capsule by mouth daily.        . predniSONE (DELTASONE) 10 MG tablet Take 1 tablet (10 mg total) by mouth daily.  100 tablet  6  . traZODone (DESYREL) 50 MG tablet Take 50 mg by mouth at bedtime as needed.       Marland Kitchen  triamterene-hydrochlorothiazide (MAXZIDE-25) 37.5-25 MG per tablet 1 tablet in the morning as needed ankle swelling       . DISCONTD: predniSONE (DELTASONE) 10 MG tablet Take 4 for four days 3 for four days 2 for four days then one daily  40 tablet  0  . DISCONTD: predniSONE (DELTASONE) 10 MG tablet Take 10 mg by mouth daily.      Marland Kitchen DISCONTD: omeprazole (PRILOSEC) 20 MG capsule Take 20 mg by mouth as needed.

## 2012-04-09 ENCOUNTER — Telehealth: Payer: Self-pay | Admitting: Critical Care Medicine

## 2012-04-09 NOTE — Telephone Encounter (Signed)
Per pt when she picked up her RX for prednisone it was only for #30 x 6 refills not for #100 x 6 refills as PW had wrote on RX. I spoke with [facility] and was advised they did not have this on file. I called and spoke with pt and made her aware of this. She stated it could be her insurance would only cover for #30. She stated she will call her insurance. She needed nothing further

## 2012-06-16 DIAGNOSIS — J209 Acute bronchitis, unspecified: Secondary | ICD-10-CM | POA: Diagnosis not present

## 2012-07-08 DIAGNOSIS — B351 Tinea unguium: Secondary | ICD-10-CM | POA: Diagnosis not present

## 2012-07-29 ENCOUNTER — Ambulatory Visit (INDEPENDENT_AMBULATORY_CARE_PROVIDER_SITE_OTHER): Payer: Medicare Other | Admitting: Critical Care Medicine

## 2012-07-29 ENCOUNTER — Encounter: Payer: Self-pay | Admitting: Critical Care Medicine

## 2012-07-29 VITALS — BP 116/80 | HR 68 | Temp 98.1°F | Ht 63.0 in | Wt 188.0 lb

## 2012-07-29 DIAGNOSIS — J45909 Unspecified asthma, uncomplicated: Secondary | ICD-10-CM | POA: Diagnosis not present

## 2012-07-29 NOTE — Assessment & Plan Note (Signed)
Severe persistent asthma with significant atopic features and passive smoke exposure Prior poor HFA technique Patient improved on chronic oral prednisone Plan Maintain prednisone 10 mg daily Return 4 months

## 2012-07-29 NOTE — Progress Notes (Signed)
Subjective:    Patient ID: Darlene Murray, female    DOB: 1922-06-06, 79 y.o.   MRN: [id number]  Asthma Her past medical history is significant for asthma.   79 y.o. AAF with Moderate persistent asthma, Steroid dependent, atopic, GERD as ppt factors. Note HFA technique only at 30%    07/29/2012 Notes some daily symptoms, coughs about 2-3 times per day.  No real dyspnea, no qhs symptoms. Not using SABA.  No interference with activity. No chest pain.  Prednisone 10mg  /d is the pred dose. SHe did get pulse pred for URI 6th of Dec  Pt denies any significant sore throat, nasal congestion or excess secretions, fever, chills, sweats, unintended weight loss, pleurtic or exertional chest pain, orthopnea PND, or leg swelling Pt denies any increase in rescue therapy over baseline, denies waking up needing it or having any early am or nocturnal exacerbations of coughing/wheezing/or dyspnea. Pt also denies any obvious fluctuation in symptoms with  weather or environmental change or other alleviating or aggravating factors     PUL ASTHMA HISTORY 07/29/2012 04/08/2012 02/23/2012 12/11/2011 09/15/2011  Symptoms 0-2 days/week 0-2 days/week Daily >2 days/week 0-2 days/week  Nighttime awakenings 0-2/month 0-2/month 0-2/month 0-2/month 0-2/month  Interference with activity Minor limitations No limitations Some limitations Minor limitations No limitations  SABA use 0-2 days/wk 0-2 days/wk 0-2 days/wk 0-2 days/wk 0-2 days/wk  Exacerbations requiring oral steroids 0-1 / year 0-1 / year 2 or more / year 0-1 / year 0-1 / year     Past Medical History  Diagnosis Date  . Blindness   . Asthmatic bronchitis   . Asthma   . Fibrocystic breast disease   . Allergic rhinitis   . Hyperlipidemia   . Venous insufficiency     lower extremities  . HNP (herniated nucleus pulposus), lumbar     L4-L5  . Left ventricular hypertrophy 2008    mild  . Antibiotic-associated diarrhea      Family History  Problem Relation Age  of Onset  . Heart attack Sister   . Alzheimer's disease Sister   . Leukemia Mother   . Asthma Son      History   Social History  . Marital Status: Married    Spouse Name: N/A    Number of Children: 3  . Years of Education: N/A   Occupational History  . Retired     Runner, broadcasting/film/video   Social History Main Topics  . Smoking status: Never Smoker   . Smokeless tobacco: Never Used  . Alcohol Use: Yes     Comment: wine occasionally  . Drug Use: No  . Sexually Active: Yes -- Female partner(s)   Other Topics Concern  . Not on file   Social History Narrative  . No narrative on file     No Known Allergies   Outpatient Prescriptions Prior to Visit  Medication Sig Dispense Refill  . albuterol (PROAIR HFA) 108 (90 BASE) MCG/ACT inhaler Inhale 2 puffs into the lungs every 4 (four) hours as needed.        . calcium carbonate (OS-CAL) 600 MG TABS Take 600 mg by mouth daily.        . Cholecalciferol (VITAMIN D3) 1000 UNITS CAPS Take 1 capsule by mouth daily.        . Multiple Vitamin (MULTIVITAMIN) capsule Take 1 capsule by mouth daily.        . predniSONE (DELTASONE) 10 MG tablet Take 1 tablet (10 mg total) by mouth daily.  100 tablet  6  . traZODone (DESYREL) 50 MG tablet Take 50 mg by mouth at bedtime as needed.       . triamterene-hydrochlorothiazide (MAXZIDE-25) 37.5-25 MG per tablet 1 tablet in the morning as needed ankle swelling       Last reviewed on 07/29/2012  9:59 AM by Storm Frisk, MD     Review of Systems Constitutional:   No  weight loss, night sweats,  Fevers, chills, fatigue, lassitude. HEENT:   No headaches,  Difficulty swallowing,  Tooth/dental problems,  Sore throat,                No sneezing, itching, ear ache, nasal congestion, post nasal drip,   CV:  No chest pain,  Orthopnea, PND, swelling in lower extremities, anasarca, dizziness, palpitations  GI  No heartburn, indigestion, abdominal pain, nausea, vomiting, diarrhea, change in bowel habits, loss of  appetite  Resp: Notes  shortness of breath with exertion and  at rest.  Notes  Excess yellow  mucus, no productive cough,  No non-productive cough,  No coughing up of blood.  Notes  change in color of mucus.  No wheezing.  No chest wall deformity  Skin: no rash or lesions.  GU: no dysuria, change in color of urine, no urgency or frequency.  No flank pain.  MS:  No joint pain or swelling.  No decreased range of motion.  No back pain.  Psych:  No change in mood or affect. No depression or anxiety.  No memory loss.     Objective:   Physical Exam  Filed Vitals:   07/29/12 0934  BP: 116/80  Pulse: 68  Temp: 98.1 F (36.7 C)  Height: 5\' 3"  (1.6 m)  Weight: 188 lb (85.276 kg)  SpO2: 99%    Gen: Pleasant, well-nourished, in no distress,  normal affect  ENT: No lesions,  mouth clear,  oropharynx clear, no postnasal drip  Neck: No JVD, no TMG, no carotid bruits  Lungs: No use of accessory muscles, no dullness to percussion, no wheezes or rhonchi  Cardiovascular: RRR, heart sounds normal, no murmur or gallops, no peripheral edema  Abdomen: soft and NT, no HSM,  BS normal  Musculoskeletal: No deformities, no cyanosis or clubbing  Neuro: alert, non focal  Skin: Warm, no lesions or rashes        Assessment & Plan:   Asthma with allergic rhinitis Severe persistent asthma with significant atopic features and passive smoke exposure Prior poor HFA technique Patient improved on chronic oral prednisone Plan Maintain prednisone 10 mg daily Return 4 months   Flu vaccine today Updated Medication List Outpatient Encounter Prescriptions as of 07/29/2012  Medication Sig Dispense Refill  . albuterol (PROAIR HFA) 108 (90 BASE) MCG/ACT inhaler Inhale 2 puffs into the lungs every 4 (four) hours as needed.        . calcium carbonate (OS-CAL) 600 MG TABS Take 600 mg by mouth daily.        . Cholecalciferol (VITAMIN D3) 1000 UNITS CAPS Take 1 capsule by mouth daily.        . Multiple  Vitamin (MULTIVITAMIN) capsule Take 1 capsule by mouth daily.        . predniSONE (DELTASONE) 10 MG tablet Take 1 tablet (10 mg total) by mouth daily.  100 tablet  6  . traZODone (DESYREL) 50 MG tablet Take 50 mg by mouth at bedtime as needed.       . triamterene-hydrochlorothiazide (MAXZIDE-25) 37.5-25 MG per tablet 1 tablet in  the morning as needed ankle swelling

## 2012-07-29 NOTE — Patient Instructions (Addendum)
No change in medications. Return in         4 months 

## 2012-09-22 DIAGNOSIS — H26499 Other secondary cataract, unspecified eye: Secondary | ICD-10-CM | POA: Diagnosis not present

## 2012-10-21 DIAGNOSIS — I10 Essential (primary) hypertension: Secondary | ICD-10-CM | POA: Diagnosis not present

## 2012-10-21 DIAGNOSIS — R42 Dizziness and giddiness: Secondary | ICD-10-CM | POA: Diagnosis not present

## 2012-10-26 ENCOUNTER — Encounter: Payer: Self-pay | Admitting: Adult Health

## 2012-10-26 ENCOUNTER — Ambulatory Visit (INDEPENDENT_AMBULATORY_CARE_PROVIDER_SITE_OTHER): Payer: Medicare Other | Admitting: Adult Health

## 2012-10-26 VITALS — BP 126/74 | HR 81 | Temp 98.0°F | Ht 63.0 in | Wt 194.0 lb

## 2012-10-26 DIAGNOSIS — J45909 Unspecified asthma, uncomplicated: Secondary | ICD-10-CM | POA: Diagnosis not present

## 2012-10-26 MED ORDER — LEVALBUTEROL HCL 0.63 MG/3ML IN NEBU
0.6300 mg | INHALATION_SOLUTION | Freq: Once | RESPIRATORY_TRACT | Status: AC
  Administered 2012-10-26: 0.63 mg via RESPIRATORY_TRACT

## 2012-10-26 MED ORDER — AZITHROMYCIN 250 MG PO TABS: ORAL_TABLET | ORAL | Status: AC

## 2012-10-26 MED ORDER — PREDNISONE 10 MG PO TABS: ORAL_TABLET | ORAL | Status: DC

## 2012-10-26 NOTE — Patient Instructions (Addendum)
Zpack take as directed.  Increase Prednisone 4 tabs for 3 days, then 3 tabs for 3 days, 2 tabs for 3 days, then 1 tab daily  Mucinex DM Twice daily  As needed  Cough/congestion.  Fluids and rest  Please contact office for sooner follow up if symptoms do not improve or worsen or seek emergency care  Follow up Dr. Delford Field as planned and As needed

## 2012-10-26 NOTE — Assessment & Plan Note (Signed)
Exacerbation with URI   Plan  Zpack take as directed.  Increase Prednisone 4 tabs for 3 days, then 3 tabs for 3 days, 2 tabs for 3 days, then 1 tab daily  Mucinex DM Twice daily  As needed  Cough/congestion.  Fluids and rest  Please contact office for sooner follow up if symptoms do not improve or worsen or seek emergency care  Follow up Dr. Delford Field as planned and As needed

## 2012-10-26 NOTE — Progress Notes (Signed)
  Subjective:    Patient ID: Darlene Murray, female    DOB: [birth year], 79 y.o.   MRN: [id number]  HPI 79 yo AAF with Moderate persistent asthma, Steroid dependent, atopic, GERD as ppt factors. Note HFA technique only at 30%  Never smoker   10/26/2012 Acute OV  Complains   wheezing, rattling in chest, prod cough with pale yellow mucus, and fatigue for 4 days .  No otc used. No fever, chest pain, orthopnea, leg swelling. No n/v. Good appetite .  Remains on prednisone 10mg  daily .  No recent travel or abx use.  Has been on multiple controller meds w/ inhalers and Neb but unable to tolerate in past.      Review of Systems Constitutional:   No  weight loss, night sweats,  Fevers, chills, fatigue, or  lassitude.  HEENT:   No headaches,  Difficulty swallowing,  Tooth/dental problems, or  Sore throat,                No sneezing, itching, ear ache,  +nasal congestion, post nasal drip,   CV:  No chest pain,  Orthopnea, PND, swelling in lower extremities, anasarca, dizziness, palpitations, syncope.   GI  No heartburn, indigestion, abdominal pain, nausea, vomiting, diarrhea, change in bowel habits, loss of appetite, bloody stools.   Resp:    No coughing up of blood.    No chest wall deformity  Skin: no rash or lesions.  GU: no dysuria, change in color of urine, no urgency or frequency.  No flank pain, no hematuria   MS:  No joint pain or swelling.  No decreased range of motion.  No back pain.  Psych:  No change in mood or affect. No depression or anxiety.  No memory loss.          Objective:   Physical Exam GEN: A/Ox3; pleasant , NAD, well nourished   HEENT:  [place]/AT,  EACs-clear, TMs-wnl, NOSE-clear drainage  THROAT-clear, no lesions, no postnasal drip or exudate noted.   NECK:  Supple w/ fair ROM; no JVD; normal carotid impulses w/o bruits; no thyromegaly or nodules palpated; no lymphadenopathy.  RESP  Few rhonchi, faint exp wheeze , no accessory muscle use, no dullness to  percussion  CARD:  RRR, no m/r/g  , no peripheral edema, pulses intact, no cyanosis or clubbing.  GI:   Soft & nt; nml bowel sounds; no organomegaly or masses detected.  Musco: Warm bil, no deformities or joint swelling noted.   Neuro: alert, no focal deficits noted.    Skin: Warm, no lesions or rashes         Assessment & Plan:

## 2012-11-16 DIAGNOSIS — R609 Edema, unspecified: Secondary | ICD-10-CM | POA: Diagnosis not present

## 2012-11-16 DIAGNOSIS — R42 Dizziness and giddiness: Secondary | ICD-10-CM | POA: Diagnosis not present

## 2012-11-16 DIAGNOSIS — E7800 Pure hypercholesterolemia, unspecified: Secondary | ICD-10-CM | POA: Diagnosis not present

## 2012-11-16 DIAGNOSIS — I10 Essential (primary) hypertension: Secondary | ICD-10-CM | POA: Diagnosis not present

## 2012-11-16 DIAGNOSIS — I119 Hypertensive heart disease without heart failure: Secondary | ICD-10-CM | POA: Diagnosis not present

## 2012-11-25 ENCOUNTER — Ambulatory Visit (INDEPENDENT_AMBULATORY_CARE_PROVIDER_SITE_OTHER): Payer: Medicare Other | Admitting: Critical Care Medicine

## 2012-11-25 ENCOUNTER — Encounter: Payer: Self-pay | Admitting: Critical Care Medicine

## 2012-11-25 VITALS — BP 124/74 | HR 90 | Temp 98.2°F | Ht 63.0 in | Wt 192.0 lb

## 2012-11-25 DIAGNOSIS — J45909 Unspecified asthma, uncomplicated: Secondary | ICD-10-CM

## 2012-11-25 NOTE — Patient Instructions (Addendum)
No change in medications. Return in     3 month

## 2012-11-25 NOTE — Assessment & Plan Note (Signed)
Severe persistent asthma with significant atopic features and prior passive smoke exposure. Inability to use HFA device is on a proper basis Steroid dependent Plan Maintain prednisone 10 mg daily for maintenance

## 2012-11-25 NOTE — Progress Notes (Signed)
Subjective:    Patient ID: Darlene Murray, female    DOB: March 19, 1922, 79 y.o.   MRN: [id number]  HPI  79 yo AAF with Moderate persistent asthma, Steroid dependent, atopic, GERD as ppt factors. Note HFA technique only at 30%  Never smoker    11/25/2012 Saw TP 4/22. Rx zpak and pred pulse No cough now.  No rescue inhaler use now. No wheezing  Pt denies any significant sore throat, nasal congestion or excess secretions, fever, chills, sweats, unintended weight loss, pleurtic or exertional chest pain, orthopnea PND, or leg swelling Pt denies any increase in rescue therapy over baseline, denies waking up needing it or having any early am or nocturnal exacerbations of coughing/wheezing/or dyspnea. Pt also denies any obvious fluctuation in symptoms with  weather or environmental change or other alleviating or aggravating factors     Review of Systems  Constitutional:   No  weight loss, night sweats,  Fevers, chills, fatigue, or  lassitude.  HEENT:   No headaches,  Difficulty swallowing,  Tooth/dental problems, or  Sore throat,                No sneezing, itching, ear ache,  No nasal congestion, post nasal drip,   CV:  No chest pain,  Orthopnea, PND, swelling in lower extremities, anasarca, dizziness, palpitations, syncope.   GI  No heartburn, indigestion, abdominal pain, nausea, vomiting, diarrhea, change in bowel habits, loss of appetite, bloody stools.   Resp:    No coughing up of blood.    No chest wall deformity  Skin: no rash or lesions.  GU: no dysuria, change in color of urine, no urgency or frequency.  No flank pain, no hematuria   MS:  No joint pain or swelling.  No decreased range of motion.  No back pain.  Psych:  No change in mood or affect. No depression or anxiety.  No memory loss.          Objective:   Physical Exam BP 124/74  Pulse 90  Temp(Src) 98.2 F (36.8 C) (Oral)  Ht 5\' 3"  (1.6 m)  Wt 192 lb (87.091 kg)  BMI 34.02 kg/m2  SpO2 100%  GEN: A/Ox3;  pleasant , NAD, well nourished   HEENT:  [place]/AT,  EACs-clear, TMs-wnl, NOSE-clear drainage  THROAT-clear, no lesions, no postnasal drip or exudate noted.   NECK:  Supple w/ fair ROM; no JVD; normal carotid impulses w/o bruits; no thyromegaly or nodules palpated; no lymphadenopathy.  RESP  Few rhonchi, faint exp wheeze , no accessory muscle use, no dullness to percussion  CARD:  RRR, no m/r/g  , no peripheral edema, pulses intact, no cyanosis or clubbing.  GI:   Soft & nt; nml bowel sounds; no organomegaly or masses detected.  Musco: Warm bil, no deformities or joint swelling noted.   Neuro: alert, no focal deficits noted.    Skin: Warm, no lesions or rashes         Assessment & Plan:   Severe persistent asthma steroid dependent Severe persistent asthma with significant atopic features and prior passive smoke exposure. Inability to use HFA device is on a proper basis Steroid dependent Plan Maintain prednisone 10 mg daily for maintenance   Updated Medication List Outpatient Encounter Prescriptions as of 11/25/2012  Medication Sig Dispense Refill  . albuterol (PROAIR HFA) 108 (90 BASE) MCG/ACT inhaler Inhale 2 puffs into the lungs every 4 (four) hours as needed.        . calcium carbonate (OS-CAL) 600 MG  TABS Take 600 mg by mouth daily.        . Cholecalciferol (VITAMIN D3) 1000 UNITS CAPS Take 1 capsule by mouth daily.        . cyanocobalamin 500 MCG tablet Take 500 mcg by mouth daily.      . predniSONE (DELTASONE) 10 MG tablet Take 1 tablet (10 mg total) by mouth daily.  100 tablet  6  . Pyridoxine HCl (VITAMIN B-6 PO) Take 1 tablet by mouth daily.      . traZODone (DESYREL) 50 MG tablet Take 50 mg by mouth at bedtime as needed.       . triamterene-hydrochlorothiazide (MAXZIDE-25) 37.5-25 MG per tablet 1 tablet in the morning as needed ankle swelling       . [DISCONTINUED] predniSONE (DELTASONE) 10 MG tablet 4 tabs for 3 days, then 3 tabs for 3 days, 2 tabs for 3 days, then 1  tab daily  30 tablet  0   No facility-administered encounter medications on file as of 11/25/2012.

## 2012-11-26 DIAGNOSIS — M48061 Spinal stenosis, lumbar region without neurogenic claudication: Secondary | ICD-10-CM | POA: Diagnosis not present

## 2012-12-14 DIAGNOSIS — M47817 Spondylosis without myelopathy or radiculopathy, lumbosacral region: Secondary | ICD-10-CM | POA: Diagnosis not present

## 2012-12-23 DIAGNOSIS — M48061 Spinal stenosis, lumbar region without neurogenic claudication: Secondary | ICD-10-CM | POA: Diagnosis not present

## 2013-01-21 DIAGNOSIS — M48061 Spinal stenosis, lumbar region without neurogenic claudication: Secondary | ICD-10-CM | POA: Diagnosis not present

## 2013-02-23 DIAGNOSIS — Z0000 Encounter for general adult medical examination without abnormal findings: Secondary | ICD-10-CM | POA: Diagnosis not present

## 2013-02-23 DIAGNOSIS — Z23 Encounter for immunization: Secondary | ICD-10-CM | POA: Diagnosis not present

## 2013-02-23 DIAGNOSIS — N289 Disorder of kidney and ureter, unspecified: Secondary | ICD-10-CM | POA: Diagnosis not present

## 2013-02-23 DIAGNOSIS — H6120 Impacted cerumen, unspecified ear: Secondary | ICD-10-CM | POA: Diagnosis not present

## 2013-02-23 DIAGNOSIS — I10 Essential (primary) hypertension: Secondary | ICD-10-CM | POA: Diagnosis not present

## 2013-02-23 DIAGNOSIS — R82998 Other abnormal findings in urine: Secondary | ICD-10-CM | POA: Diagnosis not present

## 2013-02-23 DIAGNOSIS — J45909 Unspecified asthma, uncomplicated: Secondary | ICD-10-CM | POA: Diagnosis not present

## 2013-02-23 DIAGNOSIS — I119 Hypertensive heart disease without heart failure: Secondary | ICD-10-CM | POA: Diagnosis not present

## 2013-02-23 DIAGNOSIS — R609 Edema, unspecified: Secondary | ICD-10-CM | POA: Diagnosis not present

## 2013-02-24 ENCOUNTER — Encounter: Payer: Self-pay | Admitting: Critical Care Medicine

## 2013-02-24 ENCOUNTER — Ambulatory Visit (INDEPENDENT_AMBULATORY_CARE_PROVIDER_SITE_OTHER): Payer: Medicare Other | Admitting: Critical Care Medicine

## 2013-02-24 VITALS — BP 125/86 | HR 74 | Temp 98.3°F | Ht 63.0 in | Wt 180.0 lb

## 2013-02-24 DIAGNOSIS — J45909 Unspecified asthma, uncomplicated: Secondary | ICD-10-CM

## 2013-02-24 DIAGNOSIS — J4550 Severe persistent asthma, uncomplicated: Secondary | ICD-10-CM

## 2013-02-24 MED ORDER — PREDNISONE 10 MG PO TABS: 10.0000 mg | ORAL_TABLET | Freq: Every day | ORAL | Status: DC

## 2013-02-24 NOTE — Patient Instructions (Addendum)
No change in medications. Return in   3 months 

## 2013-02-24 NOTE — Progress Notes (Signed)
Subjective:    Patient ID: Darlene Murray, female    DOB: February 27, 1922, 79 y.o.   MRN: [id number]  Asthma Her past medical history is significant for asthma.   79 yo AAF with Moderate persistent asthma, Steroid dependent, atopic, GERD as ppt factors. Note HFA technique only at 30%  Never smoker    11/25/2012 Saw TP 4/22. Rx zpak and pred pulse No cough now.  No rescue inhaler use now. No wheezing  Pt denies any significant sore throat, nasal congestion or excess secretions, fever, chills, sweats, unintended weight loss, pleurtic or exertional chest pain, orthopnea PND, or leg swelling Pt denies any increase in rescue therapy over baseline, denies waking up needing it or having any early am or nocturnal exacerbations of coughing/wheezing/or dyspnea. Pt also denies any obvious fluctuation in symptoms with  weather or environmental change or other alleviating or aggravating factors  02/24/2013 Chief Complaint  Patient presents with  . Asthma    Breathing is doing well. Denies SOB, chest tightness or coughing. Needs a refill on Prednisone.   Pt had to do pred pulse, two weeks ago.  No real cough. No chest pain.     Review of Systems  Constitutional:   No  weight loss, night sweats,  Fevers, chills, fatigue, or  lassitude.  HEENT:   No headaches,  Difficulty swallowing,  Tooth/dental problems, or  Sore throat,                No sneezing, itching, ear ache,  No nasal congestion, post nasal drip,   CV:  No chest pain,  Orthopnea, PND, swelling in lower extremities, anasarca, dizziness, palpitations, syncope.   GI  No heartburn, indigestion, abdominal pain, nausea, vomiting, diarrhea, change in bowel habits, loss of appetite, bloody stools.   Resp:    No coughing up of blood.    No chest wall deformity  Skin: no rash or lesions.  GU: no dysuria, change in color of urine, no urgency or frequency.  No flank pain, no hematuria   MS:  No joint pain or swelling.  No decreased range of  motion.  No back pain.  Psych:  No change in mood or affect. No depression or anxiety.  No memory loss.          Objective:   Physical Exam BP 125/86  Pulse 74  Temp(Src) 98.3 F (36.8 C) (Oral)  Ht 5\' 3"  (1.6 m)  Wt 180 lb (81.647 kg)  BMI 31.89 kg/m2  SpO2 98%  GEN: A/Ox3; pleasant , NAD, well nourished   HEENT:  [place]/AT,  EACs-clear, TMs-wnl, NOSE-clear drainage  THROAT-clear, no lesions, no postnasal drip or exudate noted.   NECK:  Supple w/ fair ROM; no JVD; normal carotid impulses w/o bruits; no thyromegaly or nodules palpated; no lymphadenopathy.  RESP  Few rhonchi, faint exp wheeze , no accessory muscle use, no dullness to percussion  CARD:  RRR, no m/r/g  , no peripheral edema, pulses intact, no cyanosis or clubbing.  GI:   Soft & nt; nml bowel sounds; no organomegaly or masses detected.  Musco: Warm bil, no deformities or joint swelling noted.   Neuro: alert, no focal deficits noted.    Skin: Warm, no lesions or rashes         Assessment & Plan:   Severe persistent asthma steroid dependent Severe  persistent asthma, atopy, passive smoke exposure HFA technique only 30% up to 50% after retraining 5/12>>>only 50% with more training 12/23/10>>>75% 02/12/11 >>>20% 03/05/11 D/C  HFAs 03/05/11, start BD Neb Meds d/t poor HFA technique PFTs 5/18: FeV1 94%  Fef 25 75 55%   >>>66% post BD Maintaining Prednisone 10mg /d for maintenance RX 06/02/11 2012: IgE 100, RAST neg  The patient at this visit is stable at this time and we'll maintain oral prednisone due to intolerance of bronchodilator therapy previously    Updated Medication List Outpatient Encounter Prescriptions as of 02/24/2013  Medication Sig Dispense Refill  . calcium carbonate (OS-CAL) 600 MG TABS Take 600 mg by mouth daily.        . Multiple Vitamin (MULTIVITAMIN) tablet Take 1 tablet by mouth daily.      . predniSONE (DELTASONE) 10 MG tablet Take 1 tablet (10 mg total) by mouth daily.  100 tablet  6   . Pyridoxine HCl (VITAMIN B-6 PO) Take 1 tablet by mouth daily.      . traZODone (DESYREL) 50 MG tablet Take 50 mg by mouth at bedtime as needed.       . triamterene-hydrochlorothiazide (MAXZIDE-25) 37.5-25 MG per tablet 1 tablet in the morning as needed ankle swelling       . [DISCONTINUED] Cholecalciferol (VITAMIN D3) 1000 UNITS CAPS Take 1 capsule by mouth daily.        . [DISCONTINUED] cyanocobalamin 500 MCG tablet Take 500 mcg by mouth daily.      . [DISCONTINUED] predniSONE (DELTASONE) 10 MG tablet Take 1 tablet (10 mg total) by mouth daily.  100 tablet  6  . [DISCONTINUED] predniSONE (DELTASONE) 10 MG tablet Take 1 tablet (10 mg total) by mouth daily.  100 tablet  6  . albuterol (PROAIR HFA) 108 (90 BASE) MCG/ACT inhaler Inhale 2 puffs into the lungs every 4 (four) hours as needed.         No facility-administered encounter medications on file as of 02/24/2013.

## 2013-02-24 NOTE — Assessment & Plan Note (Signed)
Severe  persistent asthma, atopy, passive smoke exposure HFA technique only 30% up to 50% after retraining 5/12>>>only 50% with more training 12/23/10>>>75% 02/12/11 >>>20% 03/05/11 D/C HFAs 03/05/11, start BD Neb Meds d/t poor HFA technique PFTs 5/18: FeV1 94%  Fef 25 75 55%   >>>66% post BD Maintaining Prednisone 10mg /d for maintenance RX 06/02/11 2012: IgE 100, RAST neg  The patient at this visit is stable at this time and we'll maintain oral prednisone due to intolerance of bronchodilator therapy previously

## 2013-03-10 ENCOUNTER — Ambulatory Visit: Payer: Medicare Other | Admitting: Critical Care Medicine

## 2013-04-25 DIAGNOSIS — J45901 Unspecified asthma with (acute) exacerbation: Secondary | ICD-10-CM | POA: Diagnosis not present

## 2013-04-25 DIAGNOSIS — J069 Acute upper respiratory infection, unspecified: Secondary | ICD-10-CM | POA: Diagnosis not present

## 2013-04-25 DIAGNOSIS — R05 Cough: Secondary | ICD-10-CM | POA: Diagnosis not present

## 2013-05-11 DIAGNOSIS — Z1231 Encounter for screening mammogram for malignant neoplasm of breast: Secondary | ICD-10-CM | POA: Diagnosis not present

## 2013-05-12 ENCOUNTER — Other Ambulatory Visit: Payer: Self-pay

## 2013-08-10 DIAGNOSIS — L259 Unspecified contact dermatitis, unspecified cause: Secondary | ICD-10-CM | POA: Diagnosis not present

## 2013-08-16 ENCOUNTER — Encounter: Payer: Self-pay | Admitting: Critical Care Medicine

## 2013-08-16 ENCOUNTER — Ambulatory Visit (HOSPITAL_BASED_OUTPATIENT_CLINIC_OR_DEPARTMENT_OTHER)
Admission: RE | Admit: 2013-08-16 | Discharge: 2013-08-16 | Disposition: A | Discharge status: Home / Self Care | Payer: Medicare Other | Source: Ambulatory Visit | Attending: Critical Care Medicine | Admitting: Critical Care Medicine

## 2013-08-16 ENCOUNTER — Ambulatory Visit (INDEPENDENT_AMBULATORY_CARE_PROVIDER_SITE_OTHER): Payer: Medicare Other | Admitting: Critical Care Medicine

## 2013-08-16 VITALS — BP 110/68 | HR 90 | Temp 97.7°F | Ht 63.0 in | Wt 194.0 lb

## 2013-08-16 DIAGNOSIS — J45902 Unspecified asthma with status asthmaticus: Secondary | ICD-10-CM | POA: Diagnosis not present

## 2013-08-16 DIAGNOSIS — R0602 Shortness of breath: Secondary | ICD-10-CM | POA: Insufficient documentation

## 2013-08-16 DIAGNOSIS — J4550 Severe persistent asthma, uncomplicated: Secondary | ICD-10-CM

## 2013-08-16 DIAGNOSIS — J45909 Unspecified asthma, uncomplicated: Secondary | ICD-10-CM

## 2013-08-16 MED ORDER — PREDNISONE 10 MG PO TABS: ORAL_TABLET | ORAL | Status: DC

## 2013-08-16 MED ORDER — AZITHROMYCIN 250 MG PO TABS: 250.0000 mg | ORAL_TABLET | Freq: Every day | ORAL | Status: DC

## 2013-08-16 NOTE — Patient Instructions (Signed)
Prednisone 10mg  Take 4 for three days 3 for three days 2 for three days then one daily Azithromycin 250mg  Take two once then one daily until gone Chest xray today Return 2 months

## 2013-08-16 NOTE — Progress Notes (Signed)
Subjective:    Patient ID: Darlene Murray, female    DOB: 06/23/1922, 80 y.o.   MRN: [id number]  HPI 80 y.o.  AAF with severe  persistent asthma, Steroid dependent, atopic, GERD as ppt factors. Note HFA technique only at 30%  Never smoker   08/16/2013 Chief Complaint  Patient presents with  . Acute Visit    occas increased SOB, chest congestion, cough - prod at times with yellow mucus, and occas wheezing x 2 wks.  No f/c/s.  Pt worse for two weeks.  Notes more cough, prod yellow mucus.  Notes some pndrip.  No real chest pain.  No f/c/s. Notes some wheezing. Notes some qhs dyspnea. Wheezes at night.     PUL ASTHMA HISTORY 08/16/2013 11/25/2012 07/29/2012 04/08/2012 02/23/2012  Symptoms Throughout the day 0-2 days/week 0-2 days/week 0-2 days/week Daily  Nighttime awakenings >1/wk but not nightly 0-2/month 0-2/month 0-2/month 0-2/month  Interference with activity Minor limitations No limitations Minor limitations No limitations Some limitations  SABA use Several times/day 0-2 days/wk 0-2 days/wk 0-2 days/wk 0-2 days/wk  Exacerbations requiring oral steroids 2 or more / year 0-1 / year 0-1 / year 0-1 / year 2 or more / year       Review of Systems Constitutional:   No  weight loss, night sweats,  Fevers, chills, fatigue, or  lassitude.  HEENT:   No headaches,  Difficulty swallowing,  Tooth/dental problems, or  Sore throat,                No sneezing, itching, ear ache,  No nasal congestion, post nasal drip,   CV:  No chest pain,  Orthopnea, PND, swelling in lower extremities, anasarca, dizziness, palpitations, syncope.   GI  No heartburn, indigestion, abdominal pain, nausea, vomiting, diarrhea, change in bowel habits, loss of appetite, bloody stools.   Resp:    No coughing up of blood.    No chest wall deformity  Skin: no rash or lesions.  GU: no dysuria, change in color of urine, no urgency or frequency.  No flank pain, no hematuria   MS:  No joint pain or swelling.  No decreased  range of motion.  No back pain.  Psych:  No change in mood or affect. No depression or anxiety.  No memory loss.          Objective:   Physical Exam BP 110/68  Pulse 90  Temp(Src) 97.7 F (36.5 C) (Oral)  Ht 5\' 3"  (1.6 m)  Wt 194 lb (87.998 kg)  BMI 34.37 kg/m2  SpO2 98%  GEN: A/Ox3; pleasant , NAD, well nourished   HEENT:  [place]/AT,  EACs-clear, TMs-wnl, NOSE-clear drainage  THROAT-clear, no lesions, no postnasal drip or exudate noted.   NECK:  Supple w/ fair ROM; no JVD; normal carotid impulses w/o bruits; no thyromegaly or nodules palpated; no lymphadenopathy.  RESP  Few rhonchi, faint exp wheeze , no accessory muscle use, no dullness to percussion  CARD:  RRR, no m/r/g  , no peripheral edema, pulses intact, no cyanosis or clubbing.  GI:   Soft & nt; nml bowel sounds; no organomegaly or masses detected.  Musco: Warm bil, no deformities or joint swelling noted.   Neuro: alert, no focal deficits noted.    Skin: Warm, no lesions or rashes         Assessment & Plan:   Severe persistent asthma in adult steroid dependent Steroid dependent asthma not able to tolerate any HFA or DPI devices Tracheobronchitis exac Plan Prednisone  10mg  Take 4 for three days 3 for three days 2 for three days then one daily Azithromycin 250mg  Take two once then one daily until gone     Updated Medication List Outpatient Encounter Prescriptions as of 08/16/2013  Medication Sig  . albuterol (PROAIR HFA) 108 (90 BASE) MCG/ACT inhaler Inhale 2 puffs into the lungs every 4 (four) hours as needed.    . calcium carbonate (OS-CAL) 600 MG TABS Take 600 mg by mouth daily.    . Multiple Vitamin (MULTIVITAMIN) tablet Take 1 tablet by mouth daily.  . predniSONE (DELTASONE) 10 MG tablet Take 4 for three days 3 for three days 2 for three days then one daily and stay  . Pyridoxine HCl (VITAMIN B-6 PO) Take 1 tablet by mouth daily.  . traZODone (DESYREL) 50 MG tablet Take 50 mg by mouth at bedtime  as needed.   . triamterene-hydrochlorothiazide (MAXZIDE-25) 37.5-25 MG per tablet 1 tablet in the morning as needed ankle swelling   . [DISCONTINUED] predniSONE (DELTASONE) 10 MG tablet Take 1 tablet (10 mg total) by mouth daily.  Marland Kitchen azithromycin (ZITHROMAX) 250 MG tablet Take 1 tablet (250 mg total) by mouth daily. Take two once then one daily until gone

## 2013-08-16 NOTE — Progress Notes (Signed)
Quick Note:  Notify the patient that the Xray is stable and no pneumonia No change in medications are recommended. Continue current meds as prescribed at last office visit ______ 

## 2013-08-17 ENCOUNTER — Telehealth: Payer: Self-pay | Admitting: Critical Care Medicine

## 2013-08-17 ENCOUNTER — Encounter: Payer: Self-pay | Admitting: Critical Care Medicine

## 2013-08-17 NOTE — Assessment & Plan Note (Signed)
Steroid dependent asthma not able to tolerate any HFA or DPI devices Tracheobronchitis exac Plan Prednisone 10mg  Take 4 for three days 3 for three days 2 for three days then one daily Azithromycin 250mg  Take two once then one daily until gone

## 2013-08-17 NOTE — Progress Notes (Signed)
Quick Note:  lmomtcb on pt's home and cell #s ______ 

## 2013-08-17 NOTE — Telephone Encounter (Signed)
Notes Recorded by Elsie Stain, MD on 08/16/2013 at 2:18 PM Notify the patient that the Xray is stable and no pneumonia No change in medications are recommended. Continue current meds as prescribed at last office visit    lmtcb x1

## 2013-08-18 NOTE — Telephone Encounter (Signed)
LMTC x 1  

## 2013-08-19 NOTE — Telephone Encounter (Signed)
LMTCB x2  

## 2013-08-19 NOTE — Telephone Encounter (Signed)
Returning a call

## 2013-08-19 NOTE — Telephone Encounter (Signed)
Results have been explained to patient, pt expressed understanding. Nothing further needed.  Pt requested MyChart Activation Code--letter printed and code read off to patient on the phone--pt was working on activating log-in on the internet at that same moment. Pt to call our office back if any issues.  Nothing further needed.

## 2013-12-08 ENCOUNTER — Encounter: Payer: Self-pay | Admitting: Critical Care Medicine

## 2013-12-08 ENCOUNTER — Ambulatory Visit (INDEPENDENT_AMBULATORY_CARE_PROVIDER_SITE_OTHER): Payer: Medicare Other | Admitting: Critical Care Medicine

## 2013-12-08 VITALS — BP 130/78 | HR 65 | Temp 97.9°F | Ht 63.0 in | Wt 187.0 lb

## 2013-12-08 DIAGNOSIS — J45909 Unspecified asthma, uncomplicated: Secondary | ICD-10-CM

## 2013-12-08 DIAGNOSIS — J4550 Severe persistent asthma, uncomplicated: Secondary | ICD-10-CM

## 2013-12-08 DIAGNOSIS — Z23 Encounter for immunization: Secondary | ICD-10-CM | POA: Diagnosis not present

## 2013-12-08 NOTE — Progress Notes (Signed)
Subjective:    Patient ID: Darlene Murray, female    DOB: [birth year], 80 y.o.   MRN: [id number]  HPI  80 y.o.  AAF with severe  persistent asthma, Steroid dependent, atopic, GERD as ppt factors. Note HFA technique only at 30%  Never smoker   12/08/2013 Chief Complaint  Patient presents with  . 4 month follow up    Good days and bad days.  DOE unchanged, wheezing at times, and occas cough - prod at times with yellow mucus.  No chest tightness/pain.  Dyspnea the same. Good and bad days.  No wheeze.  occ yellow mucus.  No GERD symptoms. No recent ABX. Pt denies any significant sore throat, nasal congestion or excess secretions, fever, chills, sweats, unintended weight loss, pleurtic or exertional chest pain, orthopnea PND, or leg swelling Pt denies any increase in rescue therapy over baseline, denies waking up needing it or having any early am or nocturnal exacerbations of coughing/wheezing/or dyspnea. Pt also denies any obvious fluctuation in symptoms with  weather or environmental change or other alleviating or aggravating factors  PUL ASTHMA HISTORY 12/08/2013 08/16/2013 11/25/2012 07/29/2012 04/08/2012  Symptoms >2 days/week Throughout the day 0-2 days/week 0-2 days/week 0-2 days/week  Nighttime awakenings 0-2/month >1/wk but not nightly 0-2/month 0-2/month 0-2/month  Interference with activity No limitations Minor limitations No limitations Minor limitations No limitations  SABA use 0-2 days/wk Several times/day 0-2 days/wk 0-2 days/wk 0-2 days/wk  Exacerbations requiring oral steroids 0-1 / year 2 or more / year 0-1 / year 0-1 / year 0-1 / year   Review of Systems  Constitutional:   No  weight loss, night sweats,  Fevers, chills, fatigue, or  lassitude.  HEENT:   No headaches,  Difficulty swallowing,  Tooth/dental problems, or  Sore throat,                No sneezing, itching, ear ache,  No nasal congestion, post nasal drip,   CV:  No chest pain,  Orthopnea, PND, swelling in lower  extremities, anasarca, dizziness, palpitations, syncope.   GI  No heartburn, indigestion, abdominal pain, nausea, vomiting, diarrhea, change in bowel habits, loss of appetite, bloody stools.   Resp:    No coughing up of blood.    No chest wall deformity  Skin: no rash or lesions.  GU: no dysuria, change in color of urine, no urgency or frequency.  No flank pain, no hematuria   MS:  No joint pain or swelling.  No decreased range of motion.  No back pain.  Psych:  No change in mood or affect. No depression or anxiety.  No memory loss.      Objective:   Physical Exam BP 130/78  Pulse 65  Temp(Src) 97.9 F (36.6 C) (Oral)  Ht 5\' 3"  (1.6 m)  Wt 187 lb (84.823 kg)  BMI 33.13 kg/m2  SpO2 99%  GEN: A/Ox3; pleasant , NAD, well nourished   HEENT:  [place]/AT,  EACs-clear, TMs-wnl, NOSE-clear drainage  THROAT-clear, no lesions, no postnasal drip or exudate noted.   NECK:  Supple w/ fair ROM; no JVD; normal carotid impulses w/o bruits; no thyromegaly or nodules palpated; no lymphadenopathy.  RESP  Few rhonchi, faint exp wheeze , no accessory muscle use, no dullness to percussion  CARD:  RRR, no m/r/g  , no peripheral edema, pulses intact, no cyanosis or clubbing.  GI:   Soft & nt; nml bowel sounds; no organomegaly or masses detected.  Musco: Warm bil, no deformities or joint swelling  noted.   Neuro: alert, no focal deficits noted.    Skin: Warm, no lesions or rashes      Assessment & Plan:   Severe persistent asthma in adult steroid dependent Severe persistent asthma steroid dependent, stable at present. Plan Prevnar 13 and Tdap given No change in medications Return 4 months     Updated Medication List Outpatient Encounter Prescriptions as of 12/08/2013  Medication Sig  . albuterol (PROAIR HFA) 108 (90 BASE) MCG/ACT inhaler Inhale 2 puffs into the lungs every 4 (four) hours as needed.    . calcium carbonate (OS-CAL) 600 MG TABS Take 600 mg by mouth daily.    . Multiple  Vitamin (MULTIVITAMIN) tablet Take 1 tablet by mouth daily.  . predniSONE (DELTASONE) 10 MG tablet Take 10 mg by mouth daily.  . Pyridoxine HCl (VITAMIN B-6 PO) Take 1 tablet by mouth daily.  . traZODone (DESYREL) 50 MG tablet Take 50 mg by mouth at bedtime as needed.   . triamterene-hydrochlorothiazide (MAXZIDE-25) 37.5-25 MG per tablet 1 tablet in the morning as needed ankle swelling   . [DISCONTINUED] predniSONE (DELTASONE) 10 MG tablet Take 4 for three days 3 for three days 2 for three days then one daily and stay  . [DISCONTINUED] azithromycin (ZITHROMAX) 250 MG tablet Take 1 tablet (250 mg total) by mouth daily. Take two once then one daily until gone

## 2013-12-08 NOTE — Patient Instructions (Signed)
Prevnar 13 and Tdap given No change in medications Return 4 months

## 2013-12-08 NOTE — Assessment & Plan Note (Signed)
Severe persistent asthma steroid dependent, stable at present. Plan Prevnar 13 and Tdap given No change in medications Return 4 months

## 2013-12-12 DIAGNOSIS — H1044 Vernal conjunctivitis: Secondary | ICD-10-CM | POA: Diagnosis not present

## 2014-01-10 DIAGNOSIS — H26499 Other secondary cataract, unspecified eye: Secondary | ICD-10-CM | POA: Diagnosis not present

## 2014-01-19 ENCOUNTER — Encounter: Payer: Self-pay | Admitting: *Deleted

## 2014-03-02 DIAGNOSIS — M5137 Other intervertebral disc degeneration, lumbosacral region: Secondary | ICD-10-CM | POA: Diagnosis not present

## 2014-03-02 DIAGNOSIS — Z13820 Encounter for screening for osteoporosis: Secondary | ICD-10-CM | POA: Diagnosis not present

## 2014-03-02 DIAGNOSIS — Z23 Encounter for immunization: Secondary | ICD-10-CM | POA: Diagnosis not present

## 2014-03-02 DIAGNOSIS — K219 Gastro-esophageal reflux disease without esophagitis: Secondary | ICD-10-CM | POA: Diagnosis not present

## 2014-03-02 DIAGNOSIS — J45909 Unspecified asthma, uncomplicated: Secondary | ICD-10-CM | POA: Diagnosis not present

## 2014-03-02 DIAGNOSIS — I10 Essential (primary) hypertension: Secondary | ICD-10-CM | POA: Diagnosis not present

## 2014-03-02 DIAGNOSIS — R609 Edema, unspecified: Secondary | ICD-10-CM | POA: Diagnosis not present

## 2014-03-02 DIAGNOSIS — Z0000 Encounter for general adult medical examination without abnormal findings: Secondary | ICD-10-CM | POA: Diagnosis not present

## 2014-03-02 DIAGNOSIS — R82998 Other abnormal findings in urine: Secondary | ICD-10-CM | POA: Diagnosis not present

## 2014-03-28 DIAGNOSIS — M898X9 Other specified disorders of bone, unspecified site: Secondary | ICD-10-CM | POA: Diagnosis not present

## 2014-03-28 DIAGNOSIS — M7660 Achilles tendinitis, unspecified leg: Secondary | ICD-10-CM | POA: Diagnosis not present

## 2014-03-29 DIAGNOSIS — R350 Frequency of micturition: Secondary | ICD-10-CM | POA: Diagnosis not present

## 2014-03-30 ENCOUNTER — Ambulatory Visit (INDEPENDENT_AMBULATORY_CARE_PROVIDER_SITE_OTHER): Payer: Medicare Other | Admitting: Critical Care Medicine

## 2014-03-30 ENCOUNTER — Encounter: Payer: Self-pay | Admitting: Critical Care Medicine

## 2014-03-30 VITALS — BP 134/68 | HR 89 | Ht 63.0 in | Wt 187.0 lb

## 2014-03-30 DIAGNOSIS — J45909 Unspecified asthma, uncomplicated: Secondary | ICD-10-CM

## 2014-03-30 DIAGNOSIS — J4550 Severe persistent asthma, uncomplicated: Secondary | ICD-10-CM

## 2014-03-30 MED ORDER — PREDNISONE 5 MG PO TABS: ORAL_TABLET | ORAL | Status: DC

## 2014-03-30 MED ORDER — ALBUTEROL SULFATE HFA 108 (90 BASE) MCG/ACT IN AERS: 2.0000 | INHALATION_SPRAY | RESPIRATORY_TRACT | Status: DC | PRN

## 2014-03-30 NOTE — Progress Notes (Signed)
Subjective:    Patient ID: Darlene Murray, female    DOB: 09/04/1921, 80 y.o.   MRN: [id number]  HPI 03/30/2014 Chief Complaint  Patient presents with  . Follow-up    Pt has no breathing complaints today.   Doing well.  Bone dexa ok.  On calicuim supp. On prednisone 10mg /d.  No real cough.    PUL ASTHMA HISTORY 03/30/2014 12/08/2013 08/16/2013 11/25/2012 07/29/2012  Symptoms 0-2 days/week >2 days/week Throughout the day 0-2 days/week 0-2 days/week  Nighttime awakenings 0-2/month 0-2/month >1/wk but not nightly 0-2/month 0-2/month  Interference with activity No limitations No limitations Minor limitations No limitations Minor limitations  SABA use 0-2 days/wk 0-2 days/wk Several times/day 0-2 days/wk 0-2 days/wk  Exacerbations requiring oral steroids 2 or more / year 0-1 / year 2 or more / year 0-1 / year 0-1 / year     Review of Systems Constitutional:   No  weight loss, night sweats,  Fevers, chills, fatigue, lassitude. HEENT:   No headaches,  Difficulty swallowing,  Tooth/dental problems,  Sore throat,                No sneezing, itching, ear ache, nasal congestion, post nasal drip,   CV:  No chest pain,  Orthopnea, PND, swelling in lower extremities, anasarca, dizziness, palpitations  GI  No heartburn, indigestion, abdominal pain, nausea, vomiting, diarrhea, change in bowel habits, loss of appetite  Resp: No shortness of breath with exertion or at rest.  No excess mucus, no productive cough,  No non-productive cough,  No coughing up of blood.  No change in color of mucus.  No wheezing.  No chest wall deformity  Skin: no rash or lesions.  GU: no dysuria, change in color of urine, no urgency or frequency.  No flank pain.  MS:  No joint pain or swelling.  No decreased range of motion.  No back pain.  Psych:  No change in mood or affect. No depression or anxiety.  No memory loss.     Objective:   Physical Exam Filed Vitals:   03/30/14 0857  BP: 134/68  Pulse: 89  Height: 5\' 3"   (1.6 m)  Weight: 187 lb (84.823 kg)  SpO2: 99%    Gen: Pleasant, well-nourished, in no distress,  normal affect  ENT: No lesions,  mouth clear,  oropharynx clear, no postnasal drip  Neck: No JVD, no TMG, no carotid bruits  Lungs: No use of accessory muscles, no dullness to percussion, clear without rales or rhonchi  Cardiovascular: RRR, heart sounds normal, no murmur or gallops, no peripheral edema  Abdomen: soft and NT, no HSM,  BS normal  Musculoskeletal: No deformities, no cyanosis or clubbing  Neuro: alert, non focal  Skin: Warm, no lesions or rashes  No results found.        Assessment & Plan:   Severe persistent asthma in adult steroid dependent Reduce prednisone 10mg  alternate with 5mg  every other day for 2 weeks then reduce to 5mg  daily and stay Refill on albuterol sent to pharmacy   Return 3 months   Updated Medication List Outpatient Encounter Prescriptions as of 03/30/2014  Medication Sig  . albuterol (PROAIR HFA) 108 (90 BASE) MCG/ACT inhaler Inhale 2 puffs into the lungs every 4 (four) hours as needed.  . calcium carbonate (OS-CAL) 600 MG TABS Take 600 mg by mouth daily.    . Multiple Vitamin (MULTIVITAMIN) tablet Take 1 tablet by mouth daily.  . predniSONE (DELTASONE) 5 MG tablet Take 10mg  alternate  with 5mg  every other day for two weeks then reduce to 5mg  daily  . Pyridoxine HCl (VITAMIN B-6 PO) Take 1 tablet by mouth daily.  . traZODone (DESYREL) 50 MG tablet Take 50 mg by mouth at bedtime as needed.   . triamterene-hydrochlorothiazide (MAXZIDE-25) 37.5-25 MG per tablet 1 tablet in the morning as needed ankle swelling   . [DISCONTINUED] albuterol (PROAIR HFA) 108 (90 BASE) MCG/ACT inhaler Inhale 2 puffs into the lungs every 4 (four) hours as needed.    . [DISCONTINUED] predniSONE (DELTASONE) 10 MG tablet Take 10 mg by mouth daily.

## 2014-03-30 NOTE — Patient Instructions (Signed)
Reduce prednisone 10mg  alternate with 5mg  every other day for 2 weeks then reduce to 5mg  daily and stay Refill on albuterol sent to pharmacy   Return 3 months

## 2014-03-31 NOTE — Assessment & Plan Note (Signed)
Reduce prednisone 10mg  alternate with 5mg  every other day for 2 weeks then reduce to 5mg  daily and stay Refill on albuterol sent to pharmacy   Return 3 months

## 2014-04-06 DIAGNOSIS — M7662 Achilles tendinitis, left leg: Secondary | ICD-10-CM | POA: Diagnosis not present

## 2014-04-06 DIAGNOSIS — M2570 Osteophyte, unspecified joint: Secondary | ICD-10-CM | POA: Diagnosis not present

## 2014-04-11 DIAGNOSIS — M47816 Spondylosis without myelopathy or radiculopathy, lumbar region: Secondary | ICD-10-CM | POA: Diagnosis not present

## 2014-06-16 DIAGNOSIS — M79672 Pain in left foot: Secondary | ICD-10-CM | POA: Diagnosis not present

## 2014-06-16 DIAGNOSIS — S92325A Nondisplaced fracture of second metatarsal bone, left foot, initial encounter for closed fracture: Secondary | ICD-10-CM | POA: Diagnosis not present

## 2014-06-23 DIAGNOSIS — H1013 Acute atopic conjunctivitis, bilateral: Secondary | ICD-10-CM | POA: Diagnosis not present

## 2014-06-28 ENCOUNTER — Emergency Department (HOSPITAL_BASED_OUTPATIENT_CLINIC_OR_DEPARTMENT_OTHER): Payer: Medicare Other

## 2014-06-28 ENCOUNTER — Emergency Department (HOSPITAL_BASED_OUTPATIENT_CLINIC_OR_DEPARTMENT_OTHER)
Admission: EM | Admit: 2014-06-28 | Discharge: 2014-06-28 | Disposition: A | Discharge status: Home / Self Care | Payer: Medicare Other | Attending: Emergency Medicine | Admitting: Emergency Medicine

## 2014-06-28 ENCOUNTER — Encounter (HOSPITAL_BASED_OUTPATIENT_CLINIC_OR_DEPARTMENT_OTHER): Payer: Self-pay

## 2014-06-28 DIAGNOSIS — Y998 Other external cause status: Secondary | ICD-10-CM | POA: Insufficient documentation

## 2014-06-28 DIAGNOSIS — J45909 Unspecified asthma, uncomplicated: Secondary | ICD-10-CM | POA: Diagnosis not present

## 2014-06-28 DIAGNOSIS — J45901 Unspecified asthma with (acute) exacerbation: Secondary | ICD-10-CM | POA: Insufficient documentation

## 2014-06-28 DIAGNOSIS — Z8639 Personal history of other endocrine, nutritional and metabolic disease: Secondary | ICD-10-CM | POA: Insufficient documentation

## 2014-06-28 DIAGNOSIS — Z8679 Personal history of other diseases of the circulatory system: Secondary | ICD-10-CM | POA: Diagnosis not present

## 2014-06-28 DIAGNOSIS — H540 Blindness, both eyes: Secondary | ICD-10-CM | POA: Diagnosis not present

## 2014-06-28 DIAGNOSIS — S098XXA Other specified injuries of head, initial encounter: Secondary | ICD-10-CM | POA: Insufficient documentation

## 2014-06-28 DIAGNOSIS — R609 Edema, unspecified: Secondary | ICD-10-CM | POA: Insufficient documentation

## 2014-06-28 DIAGNOSIS — W01198A Fall on same level from slipping, tripping and stumbling with subsequent striking against other object, initial encounter: Secondary | ICD-10-CM | POA: Diagnosis not present

## 2014-06-28 DIAGNOSIS — R51 Headache: Secondary | ICD-10-CM | POA: Diagnosis not present

## 2014-06-28 DIAGNOSIS — W19XXXA Unspecified fall, initial encounter: Secondary | ICD-10-CM

## 2014-06-28 DIAGNOSIS — Z79899 Other long term (current) drug therapy: Secondary | ICD-10-CM | POA: Insufficient documentation

## 2014-06-28 DIAGNOSIS — M542 Cervicalgia: Secondary | ICD-10-CM | POA: Diagnosis not present

## 2014-06-28 DIAGNOSIS — S199XXA Unspecified injury of neck, initial encounter: Secondary | ICD-10-CM | POA: Diagnosis not present

## 2014-06-28 DIAGNOSIS — Y9289 Other specified places as the place of occurrence of the external cause: Secondary | ICD-10-CM | POA: Insufficient documentation

## 2014-06-28 DIAGNOSIS — Y9389 Activity, other specified: Secondary | ICD-10-CM | POA: Insufficient documentation

## 2014-06-28 DIAGNOSIS — Z7952 Long term (current) use of systemic steroids: Secondary | ICD-10-CM | POA: Diagnosis not present

## 2014-06-28 DIAGNOSIS — S0990XA Unspecified injury of head, initial encounter: Secondary | ICD-10-CM

## 2014-06-28 DIAGNOSIS — Z8742 Personal history of other diseases of the female genital tract: Secondary | ICD-10-CM | POA: Insufficient documentation

## 2014-06-28 DIAGNOSIS — Z8739 Personal history of other diseases of the musculoskeletal system and connective tissue: Secondary | ICD-10-CM | POA: Diagnosis not present

## 2014-06-28 DIAGNOSIS — R519 Headache, unspecified: Secondary | ICD-10-CM

## 2014-06-28 MED ORDER — ALBUTEROL SULFATE HFA 108 (90 BASE) MCG/ACT IN AERS
2.0000 | INHALATION_SPRAY | Freq: Once | RESPIRATORY_TRACT | Status: AC
  Administered 2014-06-28: 2 via RESPIRATORY_TRACT
  Filled 2014-06-28: qty 6.7

## 2014-06-28 MED ORDER — ACETAMINOPHEN 325 MG PO TABS
650.0000 mg | ORAL_TABLET | Freq: Once | ORAL | Status: AC
  Administered 2014-06-28: 650 mg via ORAL
  Filled 2014-06-28: qty 2

## 2014-06-28 NOTE — Discharge Instructions (Signed)
Head Injury °You have received a head injury. It does not appear serious at this time. Headaches and vomiting are common following head injury. It should be easy to awaken from sleeping. Sometimes it is necessary for you to stay in the emergency department for a while for observation. Sometimes admission to the hospital may be needed. After injuries such as yours, most problems occur within the first 24 hours, but side effects may occur up to 7-10 days after the injury. It is important for you to carefully monitor your condition and contact your health care provider or seek immediate medical care if there is a change in your condition. °WHAT ARE THE TYPES OF HEAD INJURIES? °Head injuries can be as minor as a bump. Some head injuries can be more severe. More severe head injuries include: °· A jarring injury to the brain (concussion). °· A bruise of the brain (contusion). This mean there is bleeding in the brain that can cause swelling. °· A cracked skull (skull fracture). °· Bleeding in the brain that collects, clots, and forms a bump (hematoma). °WHAT CAUSES A HEAD INJURY? °A serious head injury is most likely to happen to someone who is in a car wreck and is not wearing a seat belt. Other causes of major head injuries include bicycle or motorcycle accidents, sports injuries, and falls. °HOW ARE HEAD INJURIES DIAGNOSED? °A complete history of the event leading to the injury and your current symptoms will be helpful in diagnosing head injuries. Many times, pictures of the brain, such as CT or MRI are needed to see the extent of the injury. Often, an overnight hospital stay is necessary for observation.  °WHEN SHOULD I SEEK IMMEDIATE MEDICAL CARE?  °You should get help right away if: °· You have confusion or drowsiness. °· You feel sick to your stomach (nauseous) or have continued, forceful vomiting. °· You have dizziness or unsteadiness that is getting worse. °· You have severe, continued headaches not relieved by  medicine. Only take over-the-counter or prescription medicines for pain, fever, or discomfort as directed by your health care provider. °· You do not have normal function of the arms or legs or are unable to walk. °· You notice changes in the black spots in the center of the colored part of your eye (pupil). °· You have a clear or bloody fluid coming from your nose or ears. °· You have a loss of vision. °During the next 24 hours after the injury, you must stay with someone who can watch you for the warning signs. This person should contact local emergency services (911 in the [place] if you have seizures, you become unconscious, or you are unable to wake up. °HOW CAN I PREVENT A HEAD INJURY IN THE FUTURE? °The most important factor for preventing major head injuries is avoiding motor vehicle accidents.  To minimize the potential for damage to your head, it is crucial to wear seat belts while riding in motor vehicles. Wearing helmets while bike riding and playing collision sports (like football) is also helpful. Also, avoiding dangerous activities around the house will further help reduce your risk of head injury.  °WHEN CAN I RETURN TO NORMAL ACTIVITIES AND ATHLETICS? °You should be reevaluated by your health care provider before returning to these activities. If you have any of the following symptoms, you should not return to activities or contact sports until 1 week after the symptoms have stopped: °· Persistent headache. °· Dizziness or vertigo. °· Poor attention and concentration. °· Confusion. °·   or contact sports until 1 week after the symptoms have stopped:   Persistent headache.   Dizziness or vertigo.   Poor attention and concentration.   Confusion.   Memory problems.   Nausea or vomiting.   Fatigue or tire easily.   Irritability.   Intolerant of bright lights or loud noises.   Anxiety or depression.   Disturbed sleep.  MAKE SURE YOU:    Understand these instructions.   Will watch your condition.   Will get help right away if you are not doing well or get worse.  Document Released: 06/23/2005 Document Revised: 06/28/2013 Document Reviewed:  02/28/2013  Exit[employer] Patient Information 2015 [employer]. This information is not intended to replace advice given to you by your health care provider. Make sure you discuss any questions you have with your health care provider.    Fall Prevention and Home Safety  Falls cause injuries and can affect all age groups. It is possible to use preventive measures to significantly decrease the likelihood of falls. There are many simple measures which can make your home safer and prevent falls.  OUTDOORS   Repair cracks and edges of walkways and driveways.   Remove high doorway thresholds.   Trim shrubbery on the main path into your home.   Have good outside lighting.   Clear walkways of tools, rocks, debris, and clutter.   Check that handrails are not broken and are securely fastened. Both sides of steps should have handrails.   Have leaves, snow, and ice cleared regularly.   Use sand or salt on walkways during winter months.   In the garage, clean up grease or oil spills.  BATHROOM   Install night lights.   Install grab bars by the toilet and in the tub and shower.   Use non-skid mats or decals in the tub or shower.   Place a plastic non-slip stool in the shower to sit on, if needed.   Keep floors dry and clean up all water on the floor immediately.   Remove soap buildup in the tub or shower on a regular basis.   Secure bath mats with non-slip, double-sided rug tape.   Remove throw rugs and tripping hazards from the floors.  BEDROOMS   Install night lights.   Make sure a bedside light is easy to reach.   Do not use oversized bedding.   Keep a telephone by your bedside.   Have a firm chair with side arms to use for getting dressed.   Remove throw rugs and tripping hazards from the floor.  KITCHEN   Keep handles on pots and pans turned toward the center of the stove. Use back burners when possible.   Clean up spills quickly and allow time for drying.   Avoid walking on wet floors.   Avoid hot  utensils and knives.   Position shelves so they are not too high or low.   Place commonly used objects within easy reach.   If necessary, use a sturdy step stool with a grab bar when reaching.   Keep electrical cables out of the way.   Do not use floor polish or wax that makes floors slippery. If you must use wax, use non-skid floor wax.   Remove throw rugs and tripping hazards from the floor.  STAIRWAYS   Never leave objects on stairs.   Place handrails on both sides of stairways and use them. Fix any loose handrails. Make sure handrails on both sides of the stairways   are as long as the stairs.   Check carpeting to make sure it is firmly attached along stairs. Make repairs to worn or loose carpet promptly.   Avoid placing throw rugs at the top or bottom of stairways, or properly secure the rug with carpet tape to prevent slippage. Get rid of throw rugs, if possible.   Have an electrician put in a light switch at the top and bottom of the stairs.  OTHER FALL PREVENTION TIPS   Wear low-heel or rubber-soled shoes that are supportive and fit well. Wear closed toe shoes.   When using a stepladder, make sure it is fully opened and both spreaders are firmly locked. Do not climb a closed stepladder.   Add color or contrast paint or tape to grab bars and handrails in your home. Place contrasting color strips on first and last steps.   Learn and use mobility aids as needed. Install an electrical emergency response system.   Turn on lights to avoid dark areas. Replace light bulbs that burn out immediately. Get light switches that glow.   Arrange furniture to create clear pathways. Keep furniture in the same place.   Firmly attach carpet with non-skid or double-sided tape.   Eliminate uneven floor surfaces.   Select a carpet pattern that does not visually hide the edge of steps.   Be aware of all pets.  OTHER HOME SAFETY TIPS   Set the water temperature for 120 F (48.8 C).   Keep emergency numbers on  or near the telephone.   Keep smoke detectors on every level of the home and near sleeping areas.  Document Released: 06/13/2002 Document Revised: 12/23/2011 Document Reviewed: 09/12/2011  [employer] Patient Information 2015 [employer]. This information is not intended to replace advice given to you by your health care provider. Make sure you discuss any questions you have with your health care provider.

## 2014-06-28 NOTE — ED Provider Notes (Signed)
CSN: [id number]     Arrival date & time 06/28/14  9767 History   First MD Initiated Contact with Patient 06/28/14 1025     Chief Complaint  Patient presents with  . Fall     (Consider location/radiation/quality/duration/timing/severity/associated sxs/prior Treatment) HPI Comments: Patient reports she was walking around the side of her bed early this morning around 5 AM and fell, hitting her forehead on the bed frame.  She had no loss consciousness.  She denies preceding chest pain, palpitations, lightheadedness or dizziness.  She has had a headache that is continued into this morning.  She's had no vomiting, numbness or weakness.  She is at her baseline mental status.  She has had some increased wheezing this morning, which she takes prednisone for because she says "my doctor said that I couldn't use the inhaler well."  Patient is a 80 y.o. female presenting with fall.  Fall Associated symptoms include headaches. Pertinent negatives include no chest pain, no abdominal pain and no shortness of breath.    Past Medical History  Diagnosis Date  . Blindness   . Asthmatic bronchitis   . Asthma   . Fibrocystic breast disease   . Allergic rhinitis   . Hyperlipidemia   . Venous insufficiency     lower extremities  . HNP (herniated nucleus pulposus), lumbar     L4-L5  . Left ventricular hypertrophy 2008    mild  . Antibiotic-associated diarrhea    History reviewed. No pertinent past surgical history. Family History  Problem Relation Age of Onset  . Heart attack Sister   . Alzheimer's disease Sister   . Leukemia Mother   . Asthma Son    History  Substance Use Topics  . Smoking status: Never Smoker   . Smokeless tobacco: Never Used  . Alcohol Use: Yes     Comment: wine occasionally   OB History    No data available     Review of Systems  Constitutional: Negative for fever, chills, diaphoresis, activity change, appetite change and fatigue.  HENT: Negative for congestion,  facial swelling, rhinorrhea and sore throat.   Eyes: Negative for photophobia and discharge.  Respiratory: Negative for cough, chest tightness and shortness of breath.   Cardiovascular: Positive for leg swelling (chronic, unchanged). Negative for chest pain and palpitations.  Gastrointestinal: Negative for nausea, vomiting, abdominal pain and diarrhea.  Endocrine: Negative for polydipsia and polyuria.  Genitourinary: Negative for dysuria, frequency, difficulty urinating and pelvic pain.  Musculoskeletal: Negative for back pain, arthralgias, neck pain and neck stiffness.  Skin: Negative for color change and wound.  Allergic/Immunologic: Negative for immunocompromised state.  Neurological: Positive for headaches. Negative for facial asymmetry, weakness and numbness.  Hematological: Does not bruise/bleed easily.  Psychiatric/Behavioral: Negative for confusion and agitation.      Allergies  Review of patient's allergies indicates no known allergies.  Home Medications   Prior to Admission medications   Medication Sig Start Date End Date Taking? Authorizing Provider  albuterol (PROAIR HFA) 108 (90 BASE) MCG/ACT inhaler Inhale 2 puffs into the lungs every 4 (four) hours as needed. 03/30/14   Elsie Stain, MD  calcium carbonate (OS-CAL) 600 MG TABS Take 600 mg by mouth daily.      Historical Provider, MD  Multiple Vitamin (MULTIVITAMIN) tablet Take 1 tablet by mouth daily.    Historical Provider, MD  predniSONE (DELTASONE) 5 MG tablet Take 10mg  alternate with 5mg  every other day for two weeks then reduce to 5mg  daily 03/30/14  Elsie Stain, MD  Pyridoxine HCl (VITAMIN B-6 PO) Take 1 tablet by mouth daily.    Historical Provider, MD  traZODone (DESYREL) 50 MG tablet Take 50 mg by mouth at bedtime as needed.     Historical Provider, MD  triamterene-hydrochlorothiazide (MAXZIDE-25) 37.5-25 MG per tablet 1 tablet in the morning as needed ankle swelling     Historical Provider, MD   BP  158/75 mmHg  Pulse 55  Temp(Src) 97.8 F (36.6 C) (Oral)  Resp 16  Ht 5\' 3"  (1.6 m)  Wt 181 lb (82.101 kg)  BMI 32.07 kg/m2  SpO2 97% Physical Exam  Constitutional: She is oriented to person, place, and time. She appears well-developed and well-nourished. No distress.  HENT:  Head: Normocephalic and atraumatic.    Mouth/Throat: No oropharyngeal exudate.  Eyes: Pupils are equal, round, and reactive to light.  Neck: Normal range of motion. Neck supple.  Cardiovascular: Normal rate, regular rhythm and normal heart sounds.  Exam reveals no gallop and no friction rub.   No murmur heard. Pulmonary/Chest: Effort normal. No respiratory distress. She has wheezes in the right upper field and the right lower field. She has no rales.  Abdominal: Soft. Bowel sounds are normal. She exhibits no distension and no mass. There is no tenderness. There is no rebound and no guarding.  Musculoskeletal: Normal range of motion. She exhibits edema (2+ bilateral lower extremity). She exhibits no tenderness.  Neurological: She is alert and oriented to person, place, and time.  Skin: Skin is warm and dry.  Psychiatric: She has a normal mood and affect.    ED Course  Procedures (including critical care time) Labs Review Labs Reviewed - No data to display  Imaging Review Ct Head Wo Contrast  06/28/2014   CLINICAL DATA:  Fall last evening with head injury, head and neck pain, initial encounter  EXAM: CT HEAD WITHOUT CONTRAST  CT CERVICAL SPINE WITHOUT CONTRAST  TECHNIQUE: Multidetector CT imaging of the head and cervical spine was performed following the standard protocol without intravenous contrast. Multiplanar CT image reconstructions of the cervical spine were also generated.  COMPARISON:  None.  FINDINGS: CT HEAD FINDINGS  The bony calvarium is intact. Very mild soft tissue swelling is noted in the left frontal region consistent with the recent injury. Mild atrophic changes are seen. No findings to suggest  acute hemorrhage, acute infarction or space-occupying mass lesion are noted. A lacunar infarct is noted in the left parietal region.  CT CERVICAL SPINE FINDINGS  Seven cervical segments are well visualized. Multilevel disc space narrowing and osteophytic changes are seen. Facet hypertrophic changes are noted as well. No acute fracture or acute facet abnormality is noted. A mottled appearance with increased lucencies is noted throughout the structures of the cervical spine. It would be difficult to exclude a degree of metastatic disease or myelomatous involvement.  IMPRESSION: CT of the head: Chronic atrophic changes.  No acute abnormality is noted.  CT of the cervical spine: Multilevel degenerative change without acute abnormality.  Somewhat mottled appearance to the bony structures which may be related to underlying myelomatous process or metastatic disease.   Electronically Signed   By: Inez Catalina M.D.   On: 06/28/2014 10:34   Ct Cervical Spine Wo Contrast  06/28/2014   CLINICAL DATA:  Fall last evening with head injury, head and neck pain, initial encounter  EXAM: CT HEAD WITHOUT CONTRAST  CT CERVICAL SPINE WITHOUT CONTRAST  TECHNIQUE: Multidetector CT imaging of the head and cervical  spine was performed following the standard protocol without intravenous contrast. Multiplanar CT image reconstructions of the cervical spine were also generated.  COMPARISON:  None.  FINDINGS: CT HEAD FINDINGS  The bony calvarium is intact. Very mild soft tissue swelling is noted in the left frontal region consistent with the recent injury. Mild atrophic changes are seen. No findings to suggest acute hemorrhage, acute infarction or space-occupying mass lesion are noted. A lacunar infarct is noted in the left parietal region.  CT CERVICAL SPINE FINDINGS  Seven cervical segments are well visualized. Multilevel disc space narrowing and osteophytic changes are seen. Facet hypertrophic changes are noted as well. No acute fracture  or acute facet abnormality is noted. A mottled appearance with increased lucencies is noted throughout the structures of the cervical spine. It would be difficult to exclude a degree of metastatic disease or myelomatous involvement.  IMPRESSION: CT of the head: Chronic atrophic changes.  No acute abnormality is noted.  CT of the cervical spine: Multilevel degenerative change without acute abnormality.  Somewhat mottled appearance to the bony structures which may be related to underlying myelomatous process or metastatic disease.   Electronically Signed   By: Inez Catalina M.D.   On: 06/28/2014 10:34     EKG Interpretation None      MDM   Final diagnoses:  Closed head injury without loss of consciousness, initial encounter  Asthma, unspecified asthma severity, uncomplicated    Pt is a 80 y.o. female with Pmhx as above who presents with headache after closed head injury last night.  Patient states she is not exactly sure how she fell but denies preceding lightheadedness, dizziness, chest pain or shortness of breath.  She hit her forehead on her bed frame.  No loss of consciousness.  She is at her baseline mental status this morning.  On exam, she is well-appearing and in no acute distress.  She has small hematoma over left side of forehead.  No neck tenderness.  She is scattered wheezing in her upper lobes.  On pulmonary exam.  She has bilateral lower extremity edema, which is also at her baseline.  She has no focal neuro findings and is able to ambulate without difficulty, though did nearly trip on her postop shoe, which she is wearing on her left foot due to a stress fracture.  Feel this is likely the cause of her fall.  CT head without acute findings, CT C-spine was somewhat mottled appearance to the bony structures which radiology suggest may be related to underlying myelomatous illness process or metastatic disease.  However, she has had no recent systemic symptoms.  She is on a long-standing  prednisone prescription and I believe this is more likely the cause of this finding.  She is aware of the finding and I have asked her to follow up with her PCP, Dr. Rex Kras for further workup if needed.  She responded well to the albuterol MDI and will be discharged home with one.     Alesia Morin evaluation in the Emergency Department is complete. It has been determined that no acute conditions requiring further emergency intervention are present at this time. The patient/guardian have been advised of the diagnosis and plan. We have discussed signs and symptoms that warrant return to the ED, such as changes or worsening in symptoms, worsening headache, neck pain, confusion, nausea or vomiting.      Ernestina Patches, MD 06/28/14 [phone]

## 2014-06-28 NOTE — ED Notes (Signed)
Fell yesterday morning due to loss of balance. Fell on Tax adviser. No LOC. C/o headache and "head vibration sensation" since the fall. Pt ambulatory, full ROM in all extremities. Denies other complaints. Pt alert, oriented, coherent, good judgement. NAD noted.

## 2014-07-10 DIAGNOSIS — S92325D Nondisplaced fracture of second metatarsal bone, left foot, subsequent encounter for fracture with routine healing: Secondary | ICD-10-CM | POA: Diagnosis not present

## 2014-07-12 DIAGNOSIS — I10 Essential (primary) hypertension: Secondary | ICD-10-CM | POA: Diagnosis not present

## 2014-07-12 DIAGNOSIS — R6889 Other general symptoms and signs: Secondary | ICD-10-CM | POA: Diagnosis not present

## 2014-08-08 DIAGNOSIS — M47816 Spondylosis without myelopathy or radiculopathy, lumbar region: Secondary | ICD-10-CM | POA: Diagnosis not present

## 2014-08-11 DIAGNOSIS — M542 Cervicalgia: Secondary | ICD-10-CM | POA: Diagnosis not present

## 2014-08-17 ENCOUNTER — Ambulatory Visit (INDEPENDENT_AMBULATORY_CARE_PROVIDER_SITE_OTHER): Payer: Medicare Other | Admitting: Critical Care Medicine

## 2014-08-17 ENCOUNTER — Encounter: Payer: Self-pay | Admitting: Critical Care Medicine

## 2014-08-17 VITALS — BP 136/82 | HR 63 | Temp 97.8°F | Ht 63.0 in | Wt 184.0 lb

## 2014-08-17 DIAGNOSIS — J4550 Severe persistent asthma, uncomplicated: Secondary | ICD-10-CM | POA: Diagnosis not present

## 2014-08-17 NOTE — Progress Notes (Signed)
Subjective:    Patient ID: Darlene Murray, female    DOB: [birth year], 81 y.o.   MRN: [id number]  HPI  08/17/2014 Chief Complaint  Patient presents with  . Follow-up    doing well. Patient fractured her toe and she is afraid it may  be related to taking so much prednisone.    No new issues. Pt denies any significant sore throat, nasal congestion or excess secretions, fever, chills, sweats, unintended weight loss, pleurtic or exertional chest pain, orthopnea PND, or leg swelling Pt denies any increase in rescue therapy over baseline, denies waking up needing it or having any early am or nocturnal exacerbations of coughing/wheezing/or dyspnea. Pt also denies any obvious fluctuation in symptoms with  weather or environmental change or other alleviating or aggravating factors    PUL ASTHMA HISTORY 08/17/2014 03/30/2014 12/08/2013 08/16/2013 11/25/2012 07/29/2012 04/08/2012  Symptoms 0-2 days/week 0-2 days/week >2 days/week Throughout the day 0-2 days/week 0-2 days/week 0-2 days/week  Nighttime awakenings 0-2/month 0-2/month 0-2/month >1/wk but not nightly 0-2/month 0-2/month 0-2/month  Interference with activity No limitations No limitations No limitations Minor limitations No limitations Minor limitations No limitations  SABA use 0-2 days/wk 0-2 days/wk 0-2 days/wk Several times/day 0-2 days/wk 0-2 days/wk 0-2 days/wk  Exacerbations requiring oral steroids 2 or more / year 2 or more / year 0-1 / year 2 or more / year 0-1 / year 0-1 / year 0-1 / year     Review of Systems Constitutional:   No  weight loss, night sweats,  Fevers, chills, fatigue, lassitude. HEENT:   No headaches,  Difficulty swallowing,  Tooth/dental problems,  Sore throat,                No sneezing, itching, ear ache, nasal congestion, post nasal drip,   CV:  No chest pain,  Orthopnea, PND, swelling in lower extremities, anasarca, dizziness, palpitations  GI  No heartburn, indigestion, abdominal pain, nausea, vomiting,  diarrhea, change in bowel habits, loss of appetite  Resp: No shortness of breath with exertion or at rest.  No excess mucus, no productive cough,  No non-productive cough,  No coughing up of blood.  No change in color of mucus.  No wheezing.  No chest wall deformity  Skin: no rash or lesions.  GU: no dysuria, change in color of urine, no urgency or frequency.  No flank pain.  MS:  No joint pain or swelling.  No decreased range of motion.  No back pain.  Psych:  No change in mood or affect. No depression or anxiety.  No memory loss.     Objective:   Physical Exam Filed Vitals:   08/17/14 1122  BP: 136/82  Pulse: 63  Temp: 97.8 F (36.6 C)  TempSrc: Oral  Height: 5\' 3"  (1.6 m)  Weight: 184 lb (83.462 kg)  SpO2: 100%    Gen: Pleasant, well-nourished, in no distress,  normal affect  ENT: No lesions,  mouth clear,  oropharynx clear, no postnasal drip  Neck: No JVD, no TMG, no carotid bruits  Lungs: No use of accessory muscles, no dullness to percussion, clear without rales or rhonchi  Cardiovascular: RRR, heart sounds normal, no murmur or gallops, no peripheral edema  Abdomen: soft and NT, no HSM,  BS normal  Musculoskeletal: No deformities, no cyanosis or clubbing  Neuro: alert, non focal  Skin: Warm, no lesions or rashes  No results found.        Assessment & Plan:   Severe persistent asthma  in adult steroid dependent Stable at present with asthma  Take 2000 units Vit D daily Stay on Calcium Stay on prednisone 5 mg daily Return 6 months      Updated Medication List Outpatient Encounter Prescriptions as of 08/17/2014  Medication Sig  . calcium carbonate (OS-CAL) 600 MG TABS Take 600 mg by mouth daily.    . Multiple Vitamin (MULTIVITAMIN) tablet Take 1 tablet by mouth daily.  . predniSONE (DELTASONE) 5 MG tablet Take 10mg  alternate with 5mg  every other day for two weeks then reduce to 5mg  daily (Patient taking differently: Take by mouth. )  .  Pyridoxine HCl (VITAMIN B-6 PO) Take 1 tablet by mouth daily.  . traZODone (DESYREL) 50 MG tablet Take 50 mg by mouth at bedtime as needed.   . triamterene-hydrochlorothiazide (MAXZIDE-25) 37.5-25 MG per tablet 1 tablet in the morning as needed ankle swelling   . [DISCONTINUED] albuterol (PROAIR HFA) 108 (90 BASE) MCG/ACT inhaler Inhale 2 puffs into the lungs every 4 (four) hours as needed. (Patient not taking: Reported on 08/17/2014)

## 2014-08-17 NOTE — Patient Instructions (Signed)
Take 2000 units Vit D daily Stay on Calcium Stay on prednisone 5 mg daily Return 6 months

## 2014-08-18 NOTE — Assessment & Plan Note (Signed)
Stable at present with asthma  Take 2000 units Vit D daily Stay on Calcium Stay on prednisone 5 mg daily Return 6 months

## 2014-10-06 ENCOUNTER — Telehealth: Payer: Self-pay | Admitting: Critical Care Medicine

## 2014-10-06 MED ORDER — PREDNISONE 5 MG PO TABS: 5.0000 mg | ORAL_TABLET | Freq: Every day | ORAL | Status: DC

## 2014-10-06 NOTE — Telephone Encounter (Signed)
Spoke with the pt's spouse to verify the pharm  Rx was sent  Nothing further needed

## 2014-10-11 DIAGNOSIS — R51 Headache: Secondary | ICD-10-CM | POA: Diagnosis not present

## 2014-10-16 ENCOUNTER — Encounter: Payer: Self-pay | Admitting: Critical Care Medicine

## 2014-10-16 ENCOUNTER — Telehealth: Payer: Self-pay | Admitting: *Deleted

## 2014-10-16 ENCOUNTER — Ambulatory Visit (INDEPENDENT_AMBULATORY_CARE_PROVIDER_SITE_OTHER): Payer: Medicare Other | Admitting: Critical Care Medicine

## 2014-10-16 ENCOUNTER — Ambulatory Visit (INDEPENDENT_AMBULATORY_CARE_PROVIDER_SITE_OTHER)
Admission: RE | Admit: 2014-10-16 | Discharge: 2014-10-16 | Disposition: A | Discharge status: Home / Self Care | Payer: Medicare Other | Source: Ambulatory Visit | Attending: Critical Care Medicine | Admitting: Critical Care Medicine

## 2014-10-16 VITALS — BP 120/68 | HR 72 | Temp 97.5°F | Ht 63.0 in | Wt 187.0 lb

## 2014-10-16 DIAGNOSIS — J189 Pneumonia, unspecified organism: Secondary | ICD-10-CM

## 2014-10-16 DIAGNOSIS — J4551 Severe persistent asthma with (acute) exacerbation: Secondary | ICD-10-CM

## 2014-10-16 DIAGNOSIS — R05 Cough: Secondary | ICD-10-CM | POA: Diagnosis not present

## 2014-10-16 MED ORDER — PREDNISONE 10 MG PO TABS: ORAL_TABLET | ORAL | Status: DC

## 2014-10-16 MED ORDER — FLUTICASONE FUROATE-VILANTEROL 200-25 MCG/INH IN AEPB: 1.0000 | INHALATION_SPRAY | Freq: Every day | RESPIRATORY_TRACT | Status: DC

## 2014-10-16 MED ORDER — LEVALBUTEROL HCL 0.63 MG/3ML IN NEBU
0.6300 mg | INHALATION_SOLUTION | Freq: Once | RESPIRATORY_TRACT | Status: AC
  Administered 2014-10-16: 0.63 mg via RESPIRATORY_TRACT

## 2014-10-16 MED ORDER — AMOXICILLIN-POT CLAVULANATE 875-125 MG PO TABS: 1.0000 | ORAL_TABLET | Freq: Two times a day (BID) | ORAL | Status: DC

## 2014-10-16 MED ORDER — LEVOFLOXACIN 500 MG PO TABS: 500.0000 mg | ORAL_TABLET | Freq: Every day | ORAL | Status: DC

## 2014-10-16 MED ORDER — FLUTICASONE FUROATE-VILANTEROL 200-25 MCG/INH IN AEPB: 1.0000 | INHALATION_SPRAY | Freq: Every day | RESPIRATORY_TRACT | Status: AC

## 2014-10-16 MED ORDER — ALBUTEROL SULFATE HFA 108 (90 BASE) MCG/ACT IN AERS: 2.0000 | INHALATION_SPRAY | Freq: Four times a day (QID) | RESPIRATORY_TRACT | Status: DC | PRN

## 2014-10-16 NOTE — Progress Notes (Signed)
Subjective:    Patient ID: Darlene Murray, female    DOB: 1921/11/27, 81 y.o.   MRN: [id number]  HPI  10/16/2014 Chief Complaint  Patient presents with  . Acute Visit    Pt c/o rattle in her chest with chest tightness and congestion. Wheezing and prod cough, with yellow mucus. Denies any n/v/d and f/c/s.  Pt ill x 7days. Pt worse daily.  Notes chest tightness, coughing and congested, notes more wheezing.  Mucus is yellow, no other color. Notes some pndrip.  No real chest pain.  Notes some sinus pressure. No n/v/d.  No fever/c/s.    Notes no qhs dyspnea.    PUL ASTHMA HISTORY 10/16/2014 08/17/2014 03/30/2014 12/08/2013 08/16/2013 11/25/2012 07/29/2012  Symptoms Throughout the day 0-2 days/week 0-2 days/week >2 days/week Throughout the day 0-2 days/week 0-2 days/week  Nighttime awakenings 0-2/month 0-2/month 0-2/month 0-2/month >1/wk but not nightly 0-2/month 0-2/month  Interference with activity Some limitations No limitations No limitations No limitations Minor limitations No limitations Minor limitations  SABA use Several times/day 0-2 days/wk 0-2 days/wk 0-2 days/wk Several times/day 0-2 days/wk 0-2 days/wk  Exacerbations requiring oral steroids 2 or more / year 2 or more / year 2 or more / year 0-1 / year 2 or more / year 0-1 / year 0-1 / year     Review of Systems Constitutional:   No  weight loss, night sweats,  Fevers, chills, fatigue, lassitude. HEENT:   No headaches,  Difficulty swallowing,  Tooth/dental problems,  Sore throat,                No sneezing, itching, ear ache, nasal congestion, post nasal drip,   CV:  No chest pain,  Orthopnea, PND, swelling in lower extremities, anasarca, dizziness, palpitations  GI  No heartburn, indigestion, abdominal pain, nausea, vomiting, diarrhea, change in bowel habits, loss of appetite  Resp: Notes shortness of breath with exertion and at rest.  Notes excess mucus, notes productive cough,  No non-productive cough,  No coughing up of  blood.  Notes change in color of mucus.  No wheezing.  No chest wall deformity  Skin: no rash or lesions.  GU: no dysuria, change in color of urine, no urgency or frequency.  No flank pain.  MS:  No joint pain or swelling.  No decreased range of motion.  No back pain.  Psych:  No change in mood or affect. No depression or anxiety.  No memory loss.     Objective:   Physical Exam Filed Vitals:   10/16/14 1013  BP: 120/68  Pulse: 72  Temp: 97.5 F (36.4 C)  TempSrc: Oral  Height: 5\' 3"  (1.6 m)  Weight: 187 lb (84.823 kg)  SpO2: 100%    Gen: Pleasant, well-nourished, in no distress,  normal affect  ENT: No lesions,  mouth clear,  oropharynx clear, no postnasal drip  Neck: No JVD, no TMG, no carotid bruits  Lungs: No use of accessory muscles, no dullness to percussion, Rll consolidation changes, exp wheezes Cardiovascular: RRR, heart sounds normal, no murmur or gallops, no peripheral edema  Abdomen: soft and NT, no HSM,  BS normal  Musculoskeletal: No deformities, no cyanosis or clubbing  Neuro: alert, non focal  Skin: Warm, no lesions or rashes  Dg Chest 2 View  10/16/2014   CLINICAL DATA:  Short of breath and cough, 7 days duration.  EXAM: CHEST  2 VIEW  COMPARISON:  08/16/2013  FINDINGS: Heart size is normal. The aorta is unfolded. The  upper lungs are clear. There is patchy infiltrate at the lung bases seen on the lateral view. Difficulty discern if this is right or left based on the frontal view. No effusions. No significant bony finding.  IMPRESSION: Abnormal patchy density at the lung base seen on the lateral view consistent with basilar pneumonia. Difficulty to determine right versus left side based on the frontal view.   Electronically Signed   By: Nelson Chimes M.D.   On: 10/16/2014 11:58          Assessment & Plan:   CAP (community acquired pneumonia) RLL CAP NOS Asthma exac Plan Start Breo 200 one puff daily Albuterol as needed levaquin 500mg   daily for  7days Repulse prednisone 10mg  : Take 4 for three days 3 for three days 2 for three days 1 for three days and stop Return 1 month at [facility] office      Updated Medication List Outpatient Encounter Prescriptions as of 10/16/2014  Medication Sig  . calcium carbonate (OS-CAL) 600 MG TABS Take 600 mg by mouth daily.    . Multiple Vitamin (MULTIVITAMIN) tablet Take 1 tablet by mouth daily.  . Pyridoxine HCl (VITAMIN B-6 PO) Take 1 tablet by mouth daily.  . traZODone (DESYREL) 50 MG tablet Take 50 mg by mouth at bedtime as needed.   . triamterene-hydrochlorothiazide (MAXZIDE-25) 37.5-25 MG per tablet 1 tablet in the morning as needed ankle swelling   . [DISCONTINUED] predniSONE (DELTASONE) 20 MG tablet Take 20 mg by mouth daily with breakfast. Take 3 tabs daily x 2 days, 2 tabs daily x 2 days, 1 tab daily x 2 days then 1/2 tab daily x 2 days  . albuterol (PROVENTIL HFA;VENTOLIN HFA) 108 (90 BASE) MCG/ACT inhaler Inhale 2 puffs into the lungs every 6 (six) hours as needed for wheezing or shortness of breath.  . Fluticasone Furoate-Vilanterol (BREO ELLIPTA) 200-25 MCG/INH AEPB Inhale 1 puff into the lungs daily.  . Fluticasone Furoate-Vilanterol (BREO ELLIPTA) 200-25 MCG/INH AEPB Inhale 1 puff into the lungs daily.  . predniSONE (DELTASONE) 10 MG tablet Take 4 for three days 3 for three days 2 for three days 1 for three days and stop.  . predniSONE (DELTASONE) 5 MG tablet Take 1 tablet (5 mg total) by mouth daily with breakfast. (Patient not taking: Reported on 10/16/2014)  . [DISCONTINUED] amoxicillin-clavulanate (AUGMENTIN) 875-125 MG per tablet Take 1 tablet by mouth 2 (two) times daily.  . [EXPIRED] levalbuterol (XOPENEX) nebulizer solution 0.63 mg

## 2014-10-16 NOTE — Telephone Encounter (Signed)
Notes Recorded by Elsie Stain, MD on 10/16/2014 at 4:36 PM Let pt know CXR shows small area of pneumonia Need to change to Levaquin 500mg  daily x 7 days. Hold augmentin for now  -------  Spoke with pt.  Discussed cxr results and recs per Dr. Joya Gaskins.  She verbalized understanding and is aware to hold augmentin and that levaquin rx sent to [facility].  She is to call back if symptoms do not improve or worsen.

## 2014-10-16 NOTE — Progress Notes (Signed)
Quick Note:  Pt aware. See 10/16/14 phone msg. ______

## 2014-10-16 NOTE — Patient Instructions (Signed)
Start Breo 200 one puff daily Albuterol as needed Augmentin 875mg  one twice daily for 7days Repulse prednisone 10mg  : Take 4 for three days 3 for three days 2 for three days 1 for three days and stop A chest xray will be obtained Return 1 month at [facility] office  Note all meds sent across the street to [facility]

## 2014-10-17 DIAGNOSIS — J189 Pneumonia, unspecified organism: Secondary | ICD-10-CM | POA: Insufficient documentation

## 2014-10-17 NOTE — Assessment & Plan Note (Signed)
RLL CAP NOS Asthma exac Plan Start Breo 200 one puff daily Albuterol as needed levaquin 500mg   daily for 7days Repulse prednisone 10mg  : Take 4 for three days 3 for three days 2 for three days 1 for three days and stop Return 1 month at [facility] office

## 2014-11-23 ENCOUNTER — Encounter: Payer: Self-pay | Admitting: Critical Care Medicine

## 2014-11-23 ENCOUNTER — Ambulatory Visit (INDEPENDENT_AMBULATORY_CARE_PROVIDER_SITE_OTHER): Payer: Medicare Other | Admitting: Critical Care Medicine

## 2014-11-23 VITALS — BP 140/82 | HR 73 | Temp 97.6°F | Ht 63.0 in | Wt 188.0 lb

## 2014-11-23 DIAGNOSIS — J4550 Severe persistent asthma, uncomplicated: Secondary | ICD-10-CM | POA: Diagnosis not present

## 2014-11-23 MED ORDER — FLUTICASONE FUROATE-VILANTEROL 200-25 MCG/INH IN AEPB: 1.0000 | INHALATION_SPRAY | Freq: Every day | RESPIRATORY_TRACT | Status: DC

## 2014-11-23 MED ORDER — PREDNISONE 10 MG PO TABS: ORAL_TABLET | ORAL | Status: DC

## 2014-11-23 MED ORDER — PREDNISONE 5 MG PO TABS: ORAL_TABLET | ORAL | Status: DC

## 2014-11-23 NOTE — Progress Notes (Signed)
Subjective:    Patient ID: Darlene Murray, female    DOB: 10-13-1921, 81 y.o.   MRN: [id number]  HPI 11/23/2014 Chief Complaint  Patient presents with  . 1 month follow up    Breathing has improved from last OV.  Does note increased chest congestion x 1 day.  No SOB, wheezing, chest tightness/CP, cough, or f/c/s.  Now is better, does note some mucus and is increased.  No real dyspnea.  No wheezing , hears more mucus in chest. No fever.  No chest pain.  Pt denies any significant sore throat, nasal congestion or, fever, chills, sweats, unintended weight loss, pleurtic or exertional chest pain, orthopnea PND, or leg swelling Pt denies any increase in rescue therapy over baseline, denies waking up needing it or having any early am or nocturnal exacerbations of coughing/wheezing/or dyspnea. Pt also denies any obvious fluctuation in symptoms with  weather or environmental change or other alleviating or aggravating factors   Current Medications, Allergies, Complete Past Medical History, Past Surgical History, Family History, and Social History were reviewed in [employer] record.  Past Medical History  Diagnosis Date  . Blindness   . Asthmatic bronchitis   . Asthma   . Fibrocystic breast disease   . Allergic rhinitis   . Hyperlipidemia   . Venous insufficiency     lower extremities  . HNP (herniated nucleus pulposus), lumbar     L4-L5  . Left ventricular hypertrophy 2008    mild  . Antibiotic-associated diarrhea      Family History  Problem Relation Age of Onset  . Heart attack Sister   . Alzheimer's disease Sister   . Leukemia Mother   . Asthma Son      History   Social History  . Marital Status: Married    Spouse Name: N/A  . Number of Children: 3  . Years of Education: N/A   Occupational History  . Retired     Pharmacist, hospital   Social History Main Topics  . Smoking status: Never Smoker   . Smokeless tobacco: Never Used  . Alcohol Use: Yes       Comment: wine occasionally  . Drug Use: No  . Sexual Activity:    Partners: Female   Other Topics Concern  . Not on file   Social History Narrative     No Known Allergies   Outpatient Prescriptions Prior to Visit  Medication Sig Dispense Refill  . albuterol (PROVENTIL HFA;VENTOLIN HFA) 108 (90 BASE) MCG/ACT inhaler Inhale 2 puffs into the lungs every 6 (six) hours as needed for wheezing or shortness of breath. 1 Inhaler 6  . calcium carbonate (OS-CAL) 600 MG TABS Take 600 mg by mouth daily.      . Multiple Vitamin (MULTIVITAMIN) tablet Take 1 tablet by mouth daily.    . predniSONE (DELTASONE) 5 MG tablet Take 1 tablet (5 mg total) by mouth daily with breakfast. 30 tablet 5  . Pyridoxine HCl (VITAMIN B-6 PO) Take 1 tablet by mouth daily.    . traZODone (DESYREL) 50 MG tablet Take 50 mg by mouth at bedtime as needed.     . triamterene-hydrochlorothiazide (MAXZIDE-25) 37.5-25 MG per tablet 1 tablet in the morning as needed ankle swelling     . Fluticasone Furoate-Vilanterol (BREO ELLIPTA) 200-25 MCG/INH AEPB Inhale 1 puff into the lungs daily. (Patient not taking: Reported on 11/23/2014) 60 each 6  . levofloxacin (LEVAQUIN) 500 MG tablet Take 1 tablet (500 mg total) by mouth  daily. (Patient not taking: Reported on 11/23/2014) 7 tablet 0  . predniSONE (DELTASONE) 10 MG tablet Take 4 for three days 3 for three days 2 for three days 1 for three days and stop. (Patient not taking: Reported on 11/23/2014) 30 tablet 0   No facility-administered medications prior to visit.    Review of Systems  Constitutional: Negative for fever, chills, diaphoresis, appetite change, fatigue and unexpected weight change.  HENT: Negative for congestion, ear discharge, ear pain, hearing loss, nosebleeds, postnasal drip, rhinorrhea, sinus pressure, sneezing, sore throat, trouble swallowing and voice change.   Eyes: Negative for discharge and itching.  Respiratory: Positive for cough, shortness of breath and  wheezing. Negative for apnea, choking, chest tightness and stridor.        More congestion   Cardiovascular: Negative for chest pain, palpitations and leg swelling.  Gastrointestinal: Negative for nausea, vomiting, abdominal pain and abdominal distention.  Endocrine: Negative.   Genitourinary: Negative.   Musculoskeletal: Negative for myalgias, joint swelling and arthralgias.  Skin: Negative for rash.  Allergic/Immunologic: Negative for environmental allergies.  Neurological: Negative for dizziness, syncope, weakness and headaches.  Hematological: Negative for adenopathy. Does not bruise/bleed easily.  Psychiatric/Behavioral: Negative for sleep disturbance and agitation. The patient is not nervous/anxious.        Objective:   Physical Exam  Constitutional: She is oriented to person, place, and time. She appears well-developed and well-nourished. She is active.  HENT:  Head: Normocephalic and atraumatic.  Nose: No mucosal edema, rhinorrhea, sinus tenderness, nasal deformity or septal deviation. No epistaxis. Right sinus exhibits no maxillary sinus tenderness and no frontal sinus tenderness. Left sinus exhibits no maxillary sinus tenderness and no frontal sinus tenderness.  Mouth/Throat: Oropharynx is clear and moist. No oropharyngeal exudate.  Eyes: Conjunctivae and EOM are normal. Pupils are equal, round, and reactive to light. No scleral icterus.  Neck: Trachea normal and normal range of motion. Neck supple. No JVD present. No tracheal tenderness and no muscular tenderness present. Carotid bruit is not present. No rigidity. No tracheal deviation, no edema, no erythema and normal range of motion present. No thyromegaly present.  Cardiovascular: Normal rate, regular rhythm, S1 normal, S2 normal, normal heart sounds, intact distal pulses and normal pulses.  PMI is not displaced.  Exam reveals no gallop, no S3, no S4, no distant heart sounds and no friction rub.   No murmur heard.  No systolic  murmur is present   No diastolic murmur is present  Pulmonary/Chest: No accessory muscle usage or stridor. No apnea and no tachypnea. No respiratory distress. She has decreased breath sounds. She has wheezes in the right lower field and the left lower field. She has no rhonchi. She has no rales. Chest wall is not dull to percussion. She exhibits no mass, no tenderness, no bony tenderness and no deformity.  Distant BS  Abdominal: Soft. Normal appearance and bowel sounds are normal. She exhibits no distension and no ascites. There is no hepatosplenomegaly. There is no tenderness. There is no rigidity, no rebound and no guarding.  Musculoskeletal: Normal range of motion.  Lymphadenopathy:       Head (right side): No submental and no submandibular adenopathy present.       Head (left side): No submental and no submandibular adenopathy present.    She has no cervical adenopathy.  Neurological: She is alert and oriented to person, place, and time. She has normal strength. No sensory deficit.  Skin: Skin is warm and dry. No rash noted. She  is not diaphoretic. No pallor. Nails show no clubbing.  Psychiatric: She has a normal mood and affect. Her speech is normal and behavior is normal.  Vitals reviewed.         Assessment & Plan:  I personally reviewed all images and lab data in the [facility] system as well as any outside material available during this office visit and agree with the  radiology impressions.  I also have reviewed any data /notes/records if available in care everywhere.  Severe persistent asthma in adult steroid dependent Severe persistent asthma steroid dependent , improved with Breo Plan Prednisone 10mg  : Take 4 for three days 3 for three days 2 for three days 1 for three days and then resume 5mg  prednisone Breo one 200  puff daily, to continue Return 2 months with Dr Elsworth Soho at [place] was seen today for 1 month follow up.  Diagnoses and all orders for this  visit:  Severe persistent asthma in adult steroid dependent  Other orders -     predniSONE (DELTASONE) 10 MG tablet; Take 4 for three days 3 for three days 2 for three days 1 for three days and stop. -     predniSONE (DELTASONE) 5 MG tablet; HOLD while on prednisone 10mg , resume when 10mg  is completed -     Discontinue: Fluticasone Furoate-Vilanterol (BREO ELLIPTA) 200-25 MCG/INH AEPB; Inhale 1 puff into the lungs daily. -     Fluticasone Furoate-Vilanterol (BREO ELLIPTA) 200-25 MCG/INH AEPB; Inhale 1 puff into the lungs daily.    I had an extended discussion with the patient reviewing all relevant studies completed to date and  lasting 15 to 20 minutes of a 25 minute visit on the following ongoing concerns:   Disease process and need to cont breo daily

## 2014-11-23 NOTE — Assessment & Plan Note (Signed)
Severe persistent asthma steroid dependent , improved with Breo Plan Prednisone 10mg  : Take 4 for three days 3 for three days 2 for three days 1 for three days and then resume 5mg  prednisone Breo one 200  puff daily, to continue Return 2 months with Dr Elsworth Soho at [facility]

## 2014-11-23 NOTE — Patient Instructions (Signed)
Prednisone 10mg  : Take 4 for three days 3 for three days 2 for three days 1 for three days and then resume 5mg  prednisone Breo one puff daily, use sample and then get refilled Return 2 months with Dr Elsworth Soho at [facility]

## 2014-12-16 ENCOUNTER — Emergency Department (HOSPITAL_BASED_OUTPATIENT_CLINIC_OR_DEPARTMENT_OTHER)
Admission: EM | Admit: 2014-12-16 | Discharge: 2014-12-16 | Disposition: A | Discharge status: Home / Self Care | Payer: Medicare Other | Attending: Emergency Medicine | Admitting: Emergency Medicine

## 2014-12-16 ENCOUNTER — Encounter (HOSPITAL_BASED_OUTPATIENT_CLINIC_OR_DEPARTMENT_OTHER): Payer: Self-pay | Admitting: *Deleted

## 2014-12-16 ENCOUNTER — Emergency Department (HOSPITAL_BASED_OUTPATIENT_CLINIC_OR_DEPARTMENT_OTHER): Payer: Medicare Other

## 2014-12-16 DIAGNOSIS — Z79899 Other long term (current) drug therapy: Secondary | ICD-10-CM | POA: Diagnosis not present

## 2014-12-16 DIAGNOSIS — Z8719 Personal history of other diseases of the digestive system: Secondary | ICD-10-CM | POA: Insufficient documentation

## 2014-12-16 DIAGNOSIS — Z8639 Personal history of other endocrine, nutritional and metabolic disease: Secondary | ICD-10-CM | POA: Insufficient documentation

## 2014-12-16 DIAGNOSIS — J45901 Unspecified asthma with (acute) exacerbation: Secondary | ICD-10-CM | POA: Diagnosis not present

## 2014-12-16 DIAGNOSIS — Z7951 Long term (current) use of inhaled steroids: Secondary | ICD-10-CM | POA: Insufficient documentation

## 2014-12-16 DIAGNOSIS — H5440 Blindness, one eye, unspecified eye: Secondary | ICD-10-CM | POA: Diagnosis not present

## 2014-12-16 DIAGNOSIS — M7989 Other specified soft tissue disorders: Secondary | ICD-10-CM | POA: Diagnosis not present

## 2014-12-16 DIAGNOSIS — Z8679 Personal history of other diseases of the circulatory system: Secondary | ICD-10-CM | POA: Insufficient documentation

## 2014-12-16 DIAGNOSIS — Z8742 Personal history of other diseases of the female genital tract: Secondary | ICD-10-CM | POA: Insufficient documentation

## 2014-12-16 DIAGNOSIS — R0602 Shortness of breath: Secondary | ICD-10-CM | POA: Diagnosis not present

## 2014-12-16 LAB — CBC WITH DIFFERENTIAL/PLATELET
BASOS ABS: 0 10*3/uL (ref 0.0–0.1)
Basophils Relative: 0 % (ref 0–1)
EOS PCT: 12 % — AB (ref 0–5)
Eosinophils Absolute: 1 10*3/uL — ABNORMAL HIGH (ref 0.0–0.7)
HCT: 42.7 % (ref 36.0–46.0)
Hemoglobin: 13.8 g/dL (ref 12.0–15.0)
LYMPHS ABS: 2.3 10*3/uL (ref 0.7–4.0)
Lymphocytes Relative: 28 % (ref 12–46)
MCH: 29 pg (ref 26.0–34.0)
MCHC: 32.3 g/dL (ref 30.0–36.0)
MCV: 89.7 fL (ref 78.0–100.0)
Monocytes Absolute: 1.3 10*3/uL — ABNORMAL HIGH (ref 0.1–1.0)
Monocytes Relative: 15 % — ABNORMAL HIGH (ref 3–12)
NEUTROS ABS: 3.7 10*3/uL (ref 1.7–7.7)
NEUTROS PCT: 45 % (ref 43–77)
PLATELETS: 236 10*3/uL (ref 150–400)
RBC: 4.76 MIL/uL (ref 3.87–5.11)
RDW: 15.8 % — ABNORMAL HIGH (ref 11.5–15.5)
WBC: 8.4 10*3/uL (ref 4.0–10.5)

## 2014-12-16 LAB — BASIC METABOLIC PANEL
ANION GAP: 6 (ref 5–15)
BUN: 12 mg/dL (ref 6–20)
CO2: 23 mmol/L (ref 22–32)
Calcium: 8.5 mg/dL — ABNORMAL LOW (ref 8.9–10.3)
Chloride: 108 mmol/L (ref 101–111)
Creatinine, Ser: 1.25 mg/dL — ABNORMAL HIGH (ref 0.44–1.00)
GFR calc Af Amer: 43 mL/min — ABNORMAL LOW (ref >60)
GFR, EST NON AFRICAN AMERICAN: 37 mL/min — AB (ref >60)
GLUCOSE: 88 mg/dL (ref 65–99)
Potassium: 3.6 mmol/L (ref 3.5–5.1)
Sodium: 137 mmol/L (ref 135–145)

## 2014-12-16 LAB — TROPONIN I: Troponin I: <0.03 ng/mL (ref <0.031)

## 2014-12-16 LAB — BRAIN NATRIURETIC PEPTIDE: B NATRIURETIC PEPTIDE 5: 14.1 pg/mL (ref 0.0–100.0)

## 2014-12-16 MED ORDER — ALBUTEROL SULFATE HFA 108 (90 BASE) MCG/ACT IN AERS
2.0000 | INHALATION_SPRAY | Freq: Once | RESPIRATORY_TRACT | Status: AC
  Administered 2014-12-16: 2 via RESPIRATORY_TRACT
  Filled 2014-12-16: qty 6.7

## 2014-12-16 MED ORDER — PREDNISONE 20 MG PO TABS
40.0000 mg | ORAL_TABLET | Freq: Once | ORAL | Status: AC
  Administered 2014-12-16: 40 mg via ORAL
  Filled 2014-12-16: qty 2

## 2014-12-16 MED ORDER — ALBUTEROL SULFATE (2.5 MG/3ML) 0.083% IN NEBU
5.0000 mg | INHALATION_SOLUTION | Freq: Once | RESPIRATORY_TRACT | Status: AC
  Administered 2014-12-16: 5 mg via RESPIRATORY_TRACT
  Filled 2014-12-16: qty 6

## 2014-12-16 NOTE — ED Notes (Signed)
Pt placed on cont cardiac and POX monitoring

## 2014-12-16 NOTE — ED Notes (Signed)
Family at bedside. 

## 2014-12-16 NOTE — ED Notes (Signed)
States has had some SOB with "my asthma" with onset this past thursday

## 2014-12-16 NOTE — ED Provider Notes (Signed)
CSN: [id number]     Arrival date & time 12/16/14  4098 History   First MD Initiated Contact with Patient 12/16/14 0954     No chief complaint on file.  chief complaint short of breath   (Consider location/radiation/quality/duration/timing/severity/associated sxs/prior Treatment) HPI Complains of shortness of breath with wheezing onset 2 days ago. She has coughed only once. No fever She's been using her albuterol inhaler with partial relief. No chest pain no fever. Associated symptoms include vomiting one time since onset of symptoms. No other associated symptoms. No nausea at present. No abdominal pain. Past Medical History  Diagnosis Date  . Blindness   . Asthmatic bronchitis   . Asthma   . Fibrocystic breast disease   . Allergic rhinitis   . Hyperlipidemia   . Venous insufficiency     lower extremities  . HNP (herniated nucleus pulposus), lumbar     L4-L5  . Left ventricular hypertrophy 2008    mild  . Antibiotic-associated diarrhea    No past surgical history on file. Family History  Problem Relation Age of Onset  . Heart attack Sister   . Alzheimer's disease Sister   . Leukemia Mother   . Asthma Son    History  Substance Use Topics  . Smoking status: Never Smoker   . Smokeless tobacco: Never Used  . Alcohol Use: Yes     Comment: wine occasionally   OB History    No data available     Review of Systems  Constitutional: Negative.   HENT: Negative.   Respiratory: Positive for cough, shortness of breath and wheezing.   Cardiovascular: Positive for leg swelling.       Chronic leg edema 1 year  Gastrointestinal: Negative.   Musculoskeletal: Negative.   Skin: Negative.   Allergic/Immunologic: Positive for immunocompromised state.       Chronically on prednisone  Neurological: Negative.   Psychiatric/Behavioral: Negative.   All other systems reviewed and are negative.     Allergies  Review of patient's allergies indicates no known allergies.  Home  Medications   Prior to Admission medications   Medication Sig Start Date End Date Taking? Authorizing Provider  albuterol (PROVENTIL HFA;VENTOLIN HFA) 108 (90 BASE) MCG/ACT inhaler Inhale 2 puffs into the lungs every 6 (six) hours as needed for wheezing or shortness of breath. 10/16/14   Elsie Stain, MD  calcium carbonate (OS-CAL) 600 MG TABS Take 600 mg by mouth daily.      Historical Provider, MD  Fluticasone Furoate-Vilanterol (BREO ELLIPTA) 200-25 MCG/INH AEPB Inhale 1 puff into the lungs daily. 11/23/14   Elsie Stain, MD  Multiple Vitamin (MULTIVITAMIN) tablet Take 1 tablet by mouth daily.    Historical Provider, MD  predniSONE (DELTASONE) 10 MG tablet Take 4 for three days 3 for three days 2 for three days 1 for three days and stop. 11/23/14   Elsie Stain, MD  predniSONE (DELTASONE) 5 MG tablet HOLD while on prednisone 10mg , resume when 10mg  is completed 11/23/14   Elsie Stain, MD  Pyridoxine HCl (VITAMIN B-6 PO) Take 1 tablet by mouth daily.    Historical Provider, MD  traZODone (DESYREL) 50 MG tablet Take 50 mg by mouth at bedtime as needed.     Historical Provider, MD  triamterene-hydrochlorothiazide (MAXZIDE-25) 37.5-25 MG per tablet 1 tablet in the morning as needed ankle swelling     Historical Provider, MD   There were no vitals taken for this visit. Physical Exam  Constitutional: She is  oriented to person, place, and time. She appears well-developed and well-nourished. No distress.  HENT:  Head: Normocephalic and atraumatic.  Eyes: Conjunctivae are normal. Pupils are equal, round, and reactive to light.  Neck: Neck supple. JVD present. No tracheal deviation present. No thyromegaly present.  Cardiovascular: Normal rate and regular rhythm.   No murmur heard. Pulmonary/Chest: Effort normal. No respiratory distress.  Prolonged expiratory phase with expiratory wheezes, coughing occasionally no respiratory distress speaks in paragraphs  Abdominal: Soft. Bowel sounds  are normal. She exhibits no distension. There is no tenderness.  Musculoskeletal: Normal range of motion. She exhibits edema. She exhibits no tenderness.  3+ pretibial pitting edema bilaterally  Neurological: She is alert and oriented to person, place, and time. Coordination normal.  Skin: Skin is warm and dry. No rash noted.  Psychiatric: She has a normal mood and affect.  Nursing note and vitals reviewed.   ED Course  Procedures (including critical care time) Labs Review Labs Reviewed - No data to display  Imaging Review No results found.   EKG Interpretation None     ED ECG REPORT   Date: 12/16/2014  Rate: 90  Rhythm: normal sinus rhythm  QRS Axis: left  Intervals: normal  ST/T Wave abnormalities: nonspecific T wave changes  Conduction Disutrbances:right bundle branch block and left anterior fascicular block  Narrative Interpretation:   Old EKG Reviewed: Right bundle branch block new since tracing of 11/19/2008 Chest x-ray viewed by me Results for orders placed or performed during the hospital encounter of 12/16/14  CBC with Differential/Platelet  Result Value Ref Range   WBC 8.4 4.0 - 10.5 K/uL   RBC 4.76 3.87 - 5.11 MIL/uL   Hemoglobin 13.8 12.0 - 15.0 g/dL   HCT 42.7 36.0 - 46.0 %   MCV 89.7 78.0 - 100.0 fL   MCH 29.0 26.0 - 34.0 pg   MCHC 32.3 30.0 - 36.0 g/dL   RDW 15.8 (H) 11.5 - 15.5 %   Platelets 236 150 - 400 K/uL   Neutrophils Relative % 45 43 - 77 %   Neutro Abs 3.7 1.7 - 7.7 K/uL   Lymphocytes Relative 28 12 - 46 %   Lymphs Abs 2.3 0.7 - 4.0 K/uL   Monocytes Relative 15 (H) 3 - 12 %   Monocytes Absolute 1.3 (H) 0.1 - 1.0 K/uL   Eosinophils Relative 12 (H) 0 - 5 %   Eosinophils Absolute 1.0 (H) 0.0 - 0.7 K/uL   Basophils Relative 0 0 - 1 %   Basophils Absolute 0.0 0.0 - 0.1 K/uL  Basic metabolic panel  Result Value Ref Range   Sodium 137 135 - 145 mmol/L   Potassium 3.6 3.5 - 5.1 mmol/L   Chloride 108 101 - 111 mmol/L   CO2 23 22 - 32 mmol/L    Glucose, Bld 88 65 - 99 mg/dL   BUN 12 6 - 20 mg/dL   Creatinine, Ser 1.25 (H) 0.44 - 1.00 mg/dL   Calcium 8.5 (L) 8.9 - 10.3 mg/dL   GFR calc non Af Amer 37 (L) >60 mL/min   GFR calc Af Amer 43 (L) >60 mL/min   Anion gap 6 5 - 15  Troponin I  Result Value Ref Range   Troponin I <0.03 <0.031 ng/mL  Brain natriuretic peptide  Result Value Ref Range   B Natriuretic Peptide 14.1 0.0 - 100.0 pg/mL   Dg Chest 2 View  12/16/2014   CLINICAL DATA:  Increased shortness of breath for 2 days  EXAM: CHEST - 2 VIEW  COMPARISON:  10/16/2014  FINDINGS: Cardiac shadow is stable. Tortuosity of the thoracic aorta is stable as well. The lungs are well aerated bilaterally. Slight increased density is noted in the right upper lobe which may represent some early infiltrate. No sizable effusion is seen.  IMPRESSION: Slight increased parenchymal density in the right upper lobe which may represent early infiltrate. No other focal abnormality is seen.   Electronically Signed   By: Inez Catalina M.D.   On: 12/16/2014 11:08    I have personally reviewed the EKG tracing and agree with the computerized printout as noted. 12:20 PM breathing normal after nebulizer treatment here. MDM  Doubt pneumonia. Patient has coughed only once. No fever no leukocytosis. Breathing normal after one nebulized treatment. Final diagnoses:  None   plan prednisone taper starting tomorrow 40 mg, then 30 mg 613 and 06/14/, then 20 mg 6/15 and 6/16, then 10 mg 6/17 and 1/18. Then she can resume her usual 5 mg daily. She is instructed to use albuterol inhaler 2 puffs every 4 hours as needed for shortness of breath. Return if needed more than every 4 hours or see Dr. Joya Gaskins in the office  Dx :exacerbation of asthma    Orlie Dakin, MD 12/16/14 1240

## 2014-12-16 NOTE — ED Notes (Signed)
MD at bedside. 

## 2014-12-16 NOTE — Discharge Instructions (Signed)
Asthma Use your pro-air inhaler 2 puffs every 4 hours as needed for shortness of breath. Return if needed more than every 4 hours or see Dr.Wright in the office. Take the prednisone taper prescribed starting tomorrow. After he completed the prednisone taper, please resume your usual dose of prednisone at 5 mg daily. Return if you feel worse for any reason Asthma is a condition of the lungs in which the airways tighten and narrow. Asthma can make it hard to breathe. Asthma cannot be cured, but medicine and lifestyle changes can help control it. Asthma may be started (triggered) by:  Animal skin flakes (dander).  Dust.  Cockroaches.  Pollen.  Mold.  Smoke.  Cleaning products.  Hair sprays or aerosol sprays.  Paint fumes or strong smells.  Cold air, weather changes, and winds.  Crying or laughing hard.  Stress.  Certain medicines or drugs.  Foods, such as dried fruit, potato chips, and sparkling grape juice.  Infections or conditions (colds, flu).  Exercise.  Certain medical conditions or diseases.  Exercise or tiring activities. HOME CARE   Take medicine as told by your doctor.  Use a peak flow meter as told by your doctor. A peak flow meter is a tool that measures how well the lungs are working.  Record and keep track of the peak flow meter's readings.  Understand and use the asthma action plan. An asthma action plan is a written plan for taking care of your asthma and treating your attacks.  To help prevent asthma attacks:  Do not smoke. Stay away from secondhand smoke.  Change your heating and air conditioning filter often.  Limit your use of fireplaces and wood stoves.  Get rid of pests (such as roaches and mice) and their droppings.  Throw away plants if you see mold on them.  Clean your floors. Dust regularly. Use cleaning products that do not smell.  Have someone vacuum when you are not home. Use a vacuum cleaner with a HEPA filter if  possible.  Replace carpet with wood, tile, or vinyl flooring. Carpet can trap animal skin flakes and dust.  Use allergy-proof pillows, mattress covers, and box spring covers.  Wash bed sheets and blankets every week in hot water and dry them in a dryer.  Use blankets that are made of polyester or cotton.  Clean bathrooms and kitchens with bleach. If possible, have someone repaint the walls in these rooms with mold-resistant paint. Keep out of the rooms that are being cleaned and painted.  Wash hands often. GET HELP IF:  You have make a whistling sound when breaking (wheeze), have shortness of breath, or have a cough even if taking medicine to prevent attacks.  The colored mucus you cough up (sputum) is thicker than usual.  The colored mucus you cough up changes from clear or white to yellow, green, gray, or bloody.  You have problems from the medicine you are taking such as:  A rash.  Itching.  Swelling.  Trouble breathing.  You need reliever medicines more than 2-3 times a week.  Your peak flow measurement is still at 50-79% of your personal best after following the action plan for 1 hour.  You have a fever. GET HELP RIGHT AWAY IF:   You seem to be worse and are not responding to medicine during an asthma attack.  You are short of breath even at rest.  You get short of breath when doing very little activity.  You have trouble eating, drinking, or  talking.  You have chest pain.  You have a fast heartbeat.  Your lips or fingernails start to turn blue.  You are light-headed, dizzy, or faint.  Your peak flow is less than 50% of your personal best. MAKE SURE YOU:   Understand these instructions.  Will watch your condition.  Will get help right away if you are not doing well or get worse. Document Released: 12/10/2007 Document Revised: 11/07/2013 Document Reviewed: 01/20/2013 [facility] Patient Information 2015 [place], [place]. This information is not intended  to replace advice given to you by your health care provider. Make sure you discuss any questions you have with your health care provider.

## 2014-12-21 DIAGNOSIS — M9913 Subluxation complex (vertebral) of lumbar region: Secondary | ICD-10-CM | POA: Diagnosis not present

## 2014-12-21 DIAGNOSIS — M5136 Other intervertebral disc degeneration, lumbar region: Secondary | ICD-10-CM | POA: Diagnosis not present

## 2014-12-25 DIAGNOSIS — M5136 Other intervertebral disc degeneration, lumbar region: Secondary | ICD-10-CM | POA: Diagnosis not present

## 2014-12-25 DIAGNOSIS — M9913 Subluxation complex (vertebral) of lumbar region: Secondary | ICD-10-CM | POA: Diagnosis not present

## 2014-12-28 ENCOUNTER — Encounter: Payer: Self-pay | Admitting: Adult Health

## 2014-12-28 ENCOUNTER — Ambulatory Visit (INDEPENDENT_AMBULATORY_CARE_PROVIDER_SITE_OTHER): Payer: Medicare Other | Admitting: Adult Health

## 2014-12-28 VITALS — BP 123/79 | HR 78 | Temp 97.7°F | Ht 63.0 in | Wt 180.0 lb

## 2014-12-28 DIAGNOSIS — J4550 Severe persistent asthma, uncomplicated: Secondary | ICD-10-CM | POA: Diagnosis not present

## 2014-12-28 DIAGNOSIS — I519 Heart disease, unspecified: Secondary | ICD-10-CM

## 2014-12-28 DIAGNOSIS — I5189 Other ill-defined heart diseases: Secondary | ICD-10-CM | POA: Insufficient documentation

## 2014-12-28 MED ORDER — PREDNISONE 10 MG PO TABS: ORAL_TABLET | ORAL | Status: DC

## 2014-12-28 NOTE — Assessment & Plan Note (Addendum)
Slow to resolve flare  Pt with no clinical signs of PNA . However if not resolving will  Need to repeat cxr on return  Also could repeat IgE on return if frequent flares (although on chronic steroids) .  Plan  Remain on Breo 200 one puff daily Albuterol as needed Repulse prednisone 10mg  : Take 4 for three days 3 for three days 2 for three days 1 for three days then 5mg  daily  Follow up in 2 months and As needed   Please contact office for sooner follow up if symptoms do not improve or worsen or seek emergency care

## 2014-12-28 NOTE — Assessment & Plan Note (Signed)
Appears compensated without flare  Cont maxzide  Recent bnp nml

## 2014-12-28 NOTE — Patient Instructions (Signed)
Remain on Breo 200 one puff daily Albuterol as needed Repulse prednisone 10mg  : Take 4 for three days 3 for three days 2 for three days 1 for three days then 5mg  daily  Follow up in 2 months and As needed   Please contact office for sooner follow up if symptoms do not improve or worsen or seek emergency care

## 2014-12-28 NOTE — Progress Notes (Signed)
   Subjective:    Patient ID: Kimiah Hibner, female    DOB: 09/21/1921, 81 y.o.   MRN: [id number]  HPI 81 yo female never smoker with severe steroid dependent asthma   12/28/2014 ER follow up : Asthma flare  Pt returns for ER follow up . Seen in ER on 6/11 for asthma flare  Tx w/ steroid burst . ER notes reviewed  Cardiac enzymes neg. BNP nml. WBC nml. Eos elevated. (prev IgE 100 in 2012)  CXR with increased density along RUL . She has diastolic dysfunction and chronic peripheral edema on maxzide .  She says she is feeling better. Feels good with no cough or congestion .  Wheezing was better but returned last 2 days with high temps outside.  Back on prednisone 5mg  daily .  No chest pain, orthopnea, fever, increased leg swelling, n/v/d, hemoptysis .  Good appetite .  PVX and Prevnar utd.  She is on BREO , says she takes everyday.   Review of Systems Constitutional:   No  weight loss, night sweats,  Fevers, chills,  +fatigue, or  lassitude.  HEENT:   No headaches,  Difficulty swallowing,  Tooth/dental problems, or  Sore throat,                No sneezing, itching, ear ache, nasal congestion, post nasal drip,   CV:  No chest pain,  Orthopnea, PND,  , anasarca, dizziness, palpitations, syncope.   GI  No heartburn, indigestion, abdominal pain, nausea, vomiting, diarrhea, change in bowel habits, loss of appetite, bloody stools.   Resp:    No excess mucus, no productive cough,  No non-productive cough,  No coughing up of blood.  No change in color of mucus. + wheezing.  No chest wall deformity  Skin: no rash or lesions.  GU: no dysuria, change in color of urine, no urgency or frequency.  No flank pain, no hematuria   MS:  No joint pain or swelling.  No decreased range of motion.  No back pain.  Psych:  No change in mood or affect. No depression or anxiety.  No memory loss.         Objective:   Physical Exam GEN: A/Ox3; pleasant , NAD, well nourished , appears younger  than stated age.   HEENT:  [place]/AT,  EACs-clear, TMs-wnl, NOSE-clear, THROAT-clear, no lesions, no postnasal drip or exudate noted.   NECK:  Supple w/ fair ROM; no JVD; normal carotid impulses w/o bruits; no thyromegaly or nodules palpated; no lymphadenopathy.  RESP  Faint exp wheeze , no stridor ,. no accessory muscle use, no dullness to percussion  CARD:  RRR, no m/r/g  , 1-2+ peripheral edema, pulses intact, no cyanosis or clubbing.  GI:   Soft & nt; nml bowel sounds; no organomegaly or masses detected.  Musco: Warm bil, no deformities or joint swelling noted.   Neuro: alert, no focal deficits noted.    Skin: Warm, no lesions or rashes         Assessment & Plan:

## 2015-01-01 DIAGNOSIS — M9913 Subluxation complex (vertebral) of lumbar region: Secondary | ICD-10-CM | POA: Diagnosis not present

## 2015-01-01 DIAGNOSIS — M5136 Other intervertebral disc degeneration, lumbar region: Secondary | ICD-10-CM | POA: Diagnosis not present

## 2015-01-01 NOTE — Progress Notes (Signed)
Reviewed & agree with plan  

## 2015-01-04 DIAGNOSIS — H26492 Other secondary cataract, left eye: Secondary | ICD-10-CM | POA: Diagnosis not present

## 2015-01-12 DIAGNOSIS — M9913 Subluxation complex (vertebral) of lumbar region: Secondary | ICD-10-CM | POA: Diagnosis not present

## 2015-01-12 DIAGNOSIS — M5136 Other intervertebral disc degeneration, lumbar region: Secondary | ICD-10-CM | POA: Diagnosis not present

## 2015-01-22 ENCOUNTER — Telehealth: Payer: Self-pay | Admitting: Adult Health

## 2015-01-22 NOTE — Telephone Encounter (Signed)
Attempted to call pt. No answer. Will call back.

## 2015-01-23 MED ORDER — PREDNISONE 10 MG PO TABS: 10.0000 mg | ORAL_TABLET | Freq: Every day | ORAL | Status: DC

## 2015-01-23 NOTE — Telephone Encounter (Signed)
Pt calling back again about this script for prednisone, she is now wanting to speak to dr Joya Gaskins or his nurse pt can be reached @ [phone].Hillery Hunter

## 2015-01-23 NOTE — Telephone Encounter (Signed)
If pt is this bad will need ov or ER .  She may have prednisone 10mg  daily for 7 days # 7 then resume 5mg  daily .  She needs ov with Dr. Joya Gaskins  , as previously noted in last ov . This has not been set up .  Please make sure she has a follow up  If not improving will need sooner ov Please contact office for sooner follow up if symptoms do not improve or worsen or seek emergency care

## 2015-01-23 NOTE — Telephone Encounter (Signed)
Pt is aware of TP's recommendations. Rx will be sent in. ROV has been scheduled with PW on 02/28/15 at 10am at [facility]. Nothing further was needed at this time.

## 2015-01-23 NOTE — Telephone Encounter (Signed)
Per TP office note, patient suppose to take taper then continue on 5mg .  Patient says that she needs 10mg  and she needs it today.  She says that she is in trouble and if she doesn't get the 10mg  today then she will end up in the hospital.  She said she is coughing and wheezing and she has a lot of SOB.  She wants the medication sent to [place] right now.  TP - please advise.

## 2015-01-23 NOTE — Telephone Encounter (Signed)
Called made pt aware we are still awaiting response. Please advise TP thanks

## 2015-01-23 NOTE — Telephone Encounter (Signed)
Spoke with pt. She is very upset that this hasn't been addressed yet.  TP - please advise. Thanks.

## 2015-01-23 NOTE — Telephone Encounter (Signed)
Pt is upset she has not heard anything. Per pt "Get someone to write this RX now before she walks up here."  [phone]

## 2015-02-28 ENCOUNTER — Ambulatory Visit (INDEPENDENT_AMBULATORY_CARE_PROVIDER_SITE_OTHER): Payer: Medicare Other | Admitting: Critical Care Medicine

## 2015-02-28 ENCOUNTER — Encounter: Payer: Self-pay | Admitting: Critical Care Medicine

## 2015-02-28 VITALS — BP 138/76 | HR 76 | Temp 97.7°F | Ht 63.0 in | Wt 176.0 lb

## 2015-02-28 DIAGNOSIS — J4550 Severe persistent asthma, uncomplicated: Secondary | ICD-10-CM

## 2015-02-28 MED ORDER — FLUTICASONE FUROATE-VILANTEROL 200-25 MCG/INH IN AEPB: 1.0000 | INHALATION_SPRAY | Freq: Every day | RESPIRATORY_TRACT | Status: AC

## 2015-02-28 MED ORDER — PREDNISONE 5 MG PO TABS: 5.0000 mg | ORAL_TABLET | Freq: Every day | ORAL | Status: DC

## 2015-02-28 MED ORDER — FLUTICASONE FUROATE-VILANTEROL 200-25 MCG/INH IN AEPB: 1.0000 | INHALATION_SPRAY | Freq: Every day | RESPIRATORY_TRACT | Status: DC

## 2015-02-28 NOTE — Progress Notes (Signed)
Subjective:    Patient ID: Darlene Murray, female    DOB: 29-Nov-1921, 81 y.o.   MRN: [id number]  HPI 02/28/2015 Chief Complaint  Patient presents with  . 2 month follow up    Has occas wheezing and cough with white to yellow mucus, but overall feels breathing is doing well.  No SOB, chest tightness, CP, or f/c/s.  Pt notes occ wheeze.  Memory Dance has helped.  No major side effects.  Pred down to 5mg  per day,  Notes mild edema in LE   Pt denies any significant sore throat, nasal congestion or excess secretions, fever, chills, sweats, unintended weight loss, pleurtic or exertional chest pain, orthopnea PND, Pt denies any increase in rescue therapy over baseline, denies waking up needing it or having any early am or nocturnal exacerbations of coughing/wheezing/or dyspnea. Pt also denies any obvious fluctuation in symptoms with  weather or environmental change or other alleviating or aggravating factors  PUL ASTHMA HISTORY 03/01/2015 10/16/2014 08/17/2014 03/30/2014 12/08/2013 08/16/2013 11/25/2012  Symptoms 0-2 days/week Throughout the day 0-2 days/week 0-2 days/week >2 days/week Throughout the day 0-2 days/week  Nighttime awakenings 0-2/month 0-2/month 0-2/month 0-2/month 0-2/month >1/wk but not nightly 0-2/month  Interference with activity Minor limitations Some limitations No limitations No limitations No limitations Minor limitations No limitations  SABA use 0-2 days/wk Several times/day 0-2 days/wk 0-2 days/wk 0-2 days/wk Several times/day 0-2 days/wk  Exacerbations requiring oral steroids 2 or more / year 2 or more / year 2 or more / year 2 or more / year 0-1 / year 2 or more / year 0-1 / year    Current Medications, Allergies, Complete Past Medical History, Past Surgical History, Family History, and Social History were reviewed in [facility] record per todays encounter:  02/28/2015  Review of Systems  Constitutional: Negative.   HENT: Negative.  Negative for ear pain,  postnasal drip, rhinorrhea, sinus pressure, sore throat, trouble swallowing and voice change.   Eyes: Negative.   Respiratory: Positive for cough, shortness of breath and wheezing. Negative for apnea, choking, chest tightness and stridor.   Cardiovascular: Negative.  Negative for chest pain, palpitations and leg swelling.  Gastrointestinal: Negative.  Negative for nausea, vomiting, abdominal pain and abdominal distention.  Genitourinary: Negative.   Musculoskeletal: Negative.  Negative for myalgias and arthralgias.  Skin: Negative.  Negative for rash.  Allergic/Immunologic: Negative.  Negative for environmental allergies and food allergies.  Neurological: Negative.  Negative for dizziness, syncope, weakness and headaches.  Hematological: Negative.  Negative for adenopathy. Does not bruise/bleed easily.  Psychiatric/Behavioral: Negative.  Negative for sleep disturbance and agitation. The patient is not nervous/anxious.        Objective:   Physical Exam Filed Vitals:   02/28/15 0957  BP: 138/76  Pulse: 76  Temp: 97.7 F (36.5 C)  TempSrc: Oral  Height: 5\' 3"  (1.6 m)  Weight: 176 lb (79.833 kg)  SpO2: 97%    Gen: Pleasant, well-nourished, in no distress,  normal affect  ENT: No lesions,  mouth clear,  oropharynx clear, no postnasal drip  Neck: No JVD, no TMG, no carotid bruits  Lungs: No use of accessory muscles, no dullness to percussion, distant BS  Cardiovascular: RRR, heart sounds normal, no murmur or gallops, no peripheral edema  Abdomen: soft and NT, no HSM,  BS normal  Musculoskeletal: No deformities, no cyanosis or clubbing  Neuro: alert, non focal  Skin: Warm, no lesions or rashes  No results found.  Assessment & Plan:

## 2015-02-28 NOTE — Patient Instructions (Signed)
No change in medications Return 3 months Tera Partridge

## 2015-03-01 NOTE — Assessment & Plan Note (Signed)
Severe persistent asthma with atopy and passive smoke exposure Stable at present on breo and low dose prednisone Plan Cont breo 200 daily Cont low dose pred Flu vaccine in fall

## 2015-03-02 DIAGNOSIS — H1013 Acute atopic conjunctivitis, bilateral: Secondary | ICD-10-CM | POA: Diagnosis not present

## 2015-03-02 DIAGNOSIS — T1512XA Foreign body in conjunctival sac, left eye, initial encounter: Secondary | ICD-10-CM | POA: Diagnosis not present

## 2015-03-07 DIAGNOSIS — Z23 Encounter for immunization: Secondary | ICD-10-CM | POA: Diagnosis not present

## 2015-03-07 DIAGNOSIS — Z0000 Encounter for general adult medical examination without abnormal findings: Secondary | ICD-10-CM | POA: Diagnosis not present

## 2015-03-07 DIAGNOSIS — I10 Essential (primary) hypertension: Secondary | ICD-10-CM | POA: Diagnosis not present

## 2015-03-07 DIAGNOSIS — E780 Pure hypercholesterolemia: Secondary | ICD-10-CM | POA: Diagnosis not present

## 2015-03-07 DIAGNOSIS — N189 Chronic kidney disease, unspecified: Secondary | ICD-10-CM | POA: Diagnosis not present

## 2015-03-07 DIAGNOSIS — J4540 Moderate persistent asthma, uncomplicated: Secondary | ICD-10-CM | POA: Diagnosis not present

## 2015-03-07 DIAGNOSIS — N183 Chronic kidney disease, stage 3 (moderate): Secondary | ICD-10-CM | POA: Diagnosis not present

## 2015-03-07 DIAGNOSIS — R8290 Unspecified abnormal findings in urine: Secondary | ICD-10-CM | POA: Diagnosis not present

## 2015-03-07 DIAGNOSIS — M5136 Other intervertebral disc degeneration, lumbar region: Secondary | ICD-10-CM | POA: Diagnosis not present

## 2015-03-07 DIAGNOSIS — R609 Edema, unspecified: Secondary | ICD-10-CM | POA: Diagnosis not present

## 2015-03-20 DIAGNOSIS — M47816 Spondylosis without myelopathy or radiculopathy, lumbar region: Secondary | ICD-10-CM | POA: Diagnosis not present

## 2015-03-21 DIAGNOSIS — M810 Age-related osteoporosis without current pathological fracture: Secondary | ICD-10-CM | POA: Diagnosis not present

## 2015-04-05 ENCOUNTER — Emergency Department (HOSPITAL_COMMUNITY): Payer: Medicare Other

## 2015-04-05 ENCOUNTER — Encounter (HOSPITAL_COMMUNITY): Payer: Self-pay | Admitting: Emergency Medicine

## 2015-04-05 ENCOUNTER — Inpatient Hospital Stay (HOSPITAL_COMMUNITY)
Admission: EM | Admit: 2015-04-05 | Discharge: 2015-04-10 | DRG: 066 | Disposition: A | Discharge status: Rehabilitation Facility | Payer: Medicare Other | Attending: Internal Medicine | Admitting: Internal Medicine

## 2015-04-05 DIAGNOSIS — J309 Allergic rhinitis, unspecified: Secondary | ICD-10-CM | POA: Diagnosis present

## 2015-04-05 DIAGNOSIS — I6340 Cerebral infarction due to embolism of unspecified cerebral artery: Secondary | ICD-10-CM | POA: Diagnosis not present

## 2015-04-05 DIAGNOSIS — R531 Weakness: Secondary | ICD-10-CM | POA: Diagnosis not present

## 2015-04-05 DIAGNOSIS — J45909 Unspecified asthma, uncomplicated: Secondary | ICD-10-CM | POA: Diagnosis not present

## 2015-04-05 DIAGNOSIS — R0602 Shortness of breath: Secondary | ICD-10-CM | POA: Diagnosis not present

## 2015-04-05 DIAGNOSIS — Z79899 Other long term (current) drug therapy: Secondary | ICD-10-CM

## 2015-04-05 DIAGNOSIS — I63139 Cerebral infarction due to embolism of unspecified carotid artery: Secondary | ICD-10-CM | POA: Diagnosis not present

## 2015-04-05 DIAGNOSIS — Z7952 Long term (current) use of systemic steroids: Secondary | ICD-10-CM | POA: Diagnosis not present

## 2015-04-05 DIAGNOSIS — I6330 Cerebral infarction due to thrombosis of unspecified cerebral artery: Secondary | ICD-10-CM | POA: Diagnosis not present

## 2015-04-05 DIAGNOSIS — R270 Ataxia, unspecified: Secondary | ICD-10-CM | POA: Diagnosis not present

## 2015-04-05 DIAGNOSIS — Z8249 Family history of ischemic heart disease and other diseases of the circulatory system: Secondary | ICD-10-CM | POA: Diagnosis not present

## 2015-04-05 DIAGNOSIS — E785 Hyperlipidemia, unspecified: Secondary | ICD-10-CM | POA: Diagnosis present

## 2015-04-05 DIAGNOSIS — I69354 Hemiplegia and hemiparesis following cerebral infarction affecting left non-dominant side: Secondary | ICD-10-CM | POA: Diagnosis not present

## 2015-04-05 DIAGNOSIS — I6789 Other cerebrovascular disease: Secondary | ICD-10-CM | POA: Diagnosis not present

## 2015-04-05 DIAGNOSIS — I872 Venous insufficiency (chronic) (peripheral): Secondary | ICD-10-CM | POA: Diagnosis present

## 2015-04-05 DIAGNOSIS — R112 Nausea with vomiting, unspecified: Secondary | ICD-10-CM | POA: Diagnosis not present

## 2015-04-05 DIAGNOSIS — I6521 Occlusion and stenosis of right carotid artery: Secondary | ICD-10-CM | POA: Diagnosis not present

## 2015-04-05 DIAGNOSIS — R338 Other retention of urine: Secondary | ICD-10-CM | POA: Diagnosis not present

## 2015-04-05 DIAGNOSIS — R5383 Other fatigue: Secondary | ICD-10-CM | POA: Diagnosis not present

## 2015-04-05 DIAGNOSIS — Z8673 Personal history of transient ischemic attack (TIA), and cerebral infarction without residual deficits: Secondary | ICD-10-CM

## 2015-04-05 DIAGNOSIS — H540 Blindness, both eyes: Secondary | ICD-10-CM | POA: Diagnosis present

## 2015-04-05 DIAGNOSIS — R278 Other lack of coordination: Secondary | ICD-10-CM | POA: Diagnosis present

## 2015-04-05 DIAGNOSIS — J4520 Mild intermittent asthma, uncomplicated: Secondary | ICD-10-CM | POA: Diagnosis not present

## 2015-04-05 DIAGNOSIS — I639 Cerebral infarction, unspecified: Secondary | ICD-10-CM

## 2015-04-05 DIAGNOSIS — Z9849 Cataract extraction status, unspecified eye: Secondary | ICD-10-CM | POA: Diagnosis not present

## 2015-04-05 DIAGNOSIS — I6349 Cerebral infarction due to embolism of other cerebral artery: Secondary | ICD-10-CM | POA: Diagnosis not present

## 2015-04-05 DIAGNOSIS — I638 Other cerebral infarction: Secondary | ICD-10-CM | POA: Diagnosis not present

## 2015-04-05 DIAGNOSIS — I69351 Hemiplegia and hemiparesis following cerebral infarction affecting right dominant side: Secondary | ICD-10-CM | POA: Diagnosis not present

## 2015-04-05 DIAGNOSIS — R609 Edema, unspecified: Secondary | ICD-10-CM | POA: Diagnosis present

## 2015-04-05 DIAGNOSIS — R339 Retention of urine, unspecified: Secondary | ICD-10-CM | POA: Diagnosis not present

## 2015-04-05 DIAGNOSIS — I10 Essential (primary) hypertension: Secondary | ICD-10-CM | POA: Diagnosis present

## 2015-04-05 DIAGNOSIS — R1110 Vomiting, unspecified: Secondary | ICD-10-CM

## 2015-04-05 DIAGNOSIS — Z6832 Body mass index (BMI) 32.0-32.9, adult: Secondary | ICD-10-CM | POA: Diagnosis not present

## 2015-04-05 DIAGNOSIS — R404 Transient alteration of awareness: Secondary | ICD-10-CM | POA: Diagnosis not present

## 2015-04-05 DIAGNOSIS — J4550 Severe persistent asthma, uncomplicated: Secondary | ICD-10-CM | POA: Diagnosis not present

## 2015-04-05 DIAGNOSIS — Z7902 Long term (current) use of antithrombotics/antiplatelets: Secondary | ICD-10-CM | POA: Diagnosis not present

## 2015-04-05 DIAGNOSIS — K5901 Slow transit constipation: Secondary | ICD-10-CM | POA: Diagnosis not present

## 2015-04-05 DIAGNOSIS — I6503 Occlusion and stenosis of bilateral vertebral arteries: Secondary | ICD-10-CM | POA: Diagnosis not present

## 2015-04-05 DIAGNOSIS — I69393 Ataxia following cerebral infarction: Secondary | ICD-10-CM | POA: Diagnosis not present

## 2015-04-05 DIAGNOSIS — E669 Obesity, unspecified: Secondary | ICD-10-CM | POA: Diagnosis present

## 2015-04-05 DIAGNOSIS — H547 Unspecified visual loss: Secondary | ICD-10-CM | POA: Diagnosis present

## 2015-04-05 DIAGNOSIS — I63119 Cerebral infarction due to embolism of unspecified vertebral artery: Secondary | ICD-10-CM | POA: Diagnosis not present

## 2015-04-05 LAB — COMPREHENSIVE METABOLIC PANEL
ALK PHOS: 55 U/L (ref 38–126)
ALT: 17 U/L (ref 14–54)
ANION GAP: 8 (ref 5–15)
AST: 30 U/L (ref 15–41)
Albumin: 3.3 g/dL — ABNORMAL LOW (ref 3.5–5.0)
BILIRUBIN TOTAL: 1.3 mg/dL — AB (ref 0.3–1.2)
BUN: 13 mg/dL (ref 6–20)
CALCIUM: 8.9 mg/dL (ref 8.9–10.3)
CO2: 25 mmol/L (ref 22–32)
CREATININE: 1 mg/dL (ref 0.44–1.00)
Chloride: 103 mmol/L (ref 101–111)
GFR calc non Af Amer: 48 mL/min — ABNORMAL LOW (ref >60)
GFR, EST AFRICAN AMERICAN: 55 mL/min — AB (ref >60)
GLUCOSE: 113 mg/dL — AB (ref 65–99)
Potassium: 3.8 mmol/L (ref 3.5–5.1)
Sodium: 136 mmol/L (ref 135–145)
TOTAL PROTEIN: 6.2 g/dL — AB (ref 6.5–8.1)

## 2015-04-05 LAB — CBC WITH DIFFERENTIAL/PLATELET
BASOS PCT: 0 %
Basophils Absolute: 0 10*3/uL (ref 0.0–0.1)
EOS ABS: 0.2 10*3/uL (ref 0.0–0.7)
EOS PCT: 2 %
HCT: 43 % (ref 36.0–46.0)
HEMOGLOBIN: 14.2 g/dL (ref 12.0–15.0)
LYMPHS ABS: 1.6 10*3/uL (ref 0.7–4.0)
Lymphocytes Relative: 19 %
MCH: 29.4 pg (ref 26.0–34.0)
MCHC: 33 g/dL (ref 30.0–36.0)
MCV: 89 fL (ref 78.0–100.0)
MONOS PCT: 11 %
Monocytes Absolute: 0.9 10*3/uL (ref 0.1–1.0)
NEUTROS PCT: 68 %
Neutro Abs: 5.5 10*3/uL (ref 1.7–7.7)
PLATELETS: 244 10*3/uL (ref 150–400)
RBC: 4.83 MIL/uL (ref 3.87–5.11)
RDW: 15.8 % — ABNORMAL HIGH (ref 11.5–15.5)
WBC: 8.2 10*3/uL (ref 4.0–10.5)

## 2015-04-05 LAB — I-STAT TROPONIN, ED: TROPONIN I, POC: 0.01 ng/mL (ref 0.00–0.08)

## 2015-04-05 LAB — LIPASE, BLOOD: Lipase: 23 U/L (ref 22–51)

## 2015-04-05 MED ORDER — SODIUM CHLORIDE 0.9 % IV BOLUS (SEPSIS)
1000.0000 mL | Freq: Once | INTRAVENOUS | Status: AC
  Administered 2015-04-05: 1000 mL via INTRAVENOUS

## 2015-04-05 NOTE — ED Provider Notes (Signed)
CSN: [id number]     Arrival date & time 04/05/15  2152 History   First MD Initiated Contact with Patient 04/05/15 2154     Chief Complaint  Patient presents with  . Emesis  . Nausea  . Fatigue     (Consider location/radiation/quality/duration/timing/severity/associated sxs/prior Treatment) HPI  81 year old female presents with nausea and vomiting as well as with lethargy starting this morning. Vomited multiple times. No blood. Has not had any diarrhea. Family member states she was normal yesterday. She has been sleeping more but has not been confused. No urinary symptoms. No headaches, chest pain, short of breath, or abdominal pain. No weakness or numbness. EMS gave her Zofran and she feels little better. Family member at same meal as patient last night and felt his stomach was upset but no vomiting.   Past Medical History  Diagnosis Date  . Blindness   . Asthmatic bronchitis   . Asthma   . Fibrocystic breast disease   . Allergic rhinitis   . Hyperlipidemia   . Venous insufficiency     lower extremities  . HNP (herniated nucleus pulposus), lumbar     L4-L5  . Left ventricular hypertrophy 2008    mild  . Antibiotic-associated diarrhea    History reviewed. No pertinent past surgical history. Family History  Problem Relation Age of Onset  . Heart attack Sister   . Alzheimer's disease Sister   . Leukemia Mother   . Asthma Son    Social History  Substance Use Topics  . Smoking status: Never Smoker   . Smokeless tobacco: Never Used  . Alcohol Use: Yes     Comment: wine occasionally   OB History    No data available     Review of Systems  Constitutional: Negative for fever.  Respiratory: Negative for cough and shortness of breath.   Cardiovascular: Negative for chest pain.  Gastrointestinal: Positive for nausea and vomiting. Negative for abdominal pain and diarrhea.  Genitourinary: Negative for dysuria.  All other systems reviewed and are negative.     Allergies   Review of patient's allergies indicates no known allergies.  Home Medications   Prior to Admission medications   Medication Sig Start Date End Date Taking? Authorizing Provider  albuterol (PROVENTIL HFA;VENTOLIN HFA) 108 (90 BASE) MCG/ACT inhaler Inhale 2 puffs into the lungs every 6 (six) hours as needed for wheezing or shortness of breath. 10/16/14   Elsie Stain, MD  calcium carbonate (OS-CAL) 600 MG TABS Take 600 mg by mouth daily.      Historical Provider, MD  Fluticasone Furoate-Vilanterol (BREO ELLIPTA) 200-25 MCG/INH AEPB Inhale 1 puff into the lungs daily. 02/28/15   Elsie Stain, MD  Multiple Vitamin (MULTIVITAMIN) tablet Take 1 tablet by mouth daily.    Historical Provider, MD  predniSONE (DELTASONE) 5 MG tablet Take 1 tablet (5 mg total) by mouth daily. 02/28/15   Elsie Stain, MD  Pyridoxine HCl (VITAMIN B-6 PO) Take 1 tablet by mouth daily.    Historical Provider, MD  traZODone (DESYREL) 50 MG tablet Take 50 mg by mouth at bedtime as needed.     Historical Provider, MD  triamterene-hydrochlorothiazide (MAXZIDE-25) 37.5-25 MG per tablet 1 tablet in the morning as needed ankle swelling     Historical Provider, MD   BP 129/109 mmHg  Pulse 77  Temp(Src) 98.4 F (36.9 C) (Oral)  Ht 5\' 2"  (1.575 m)  Wt 173 lb (78.472 kg)  BMI 31.63 kg/m2  SpO2 98% Physical Exam  Constitutional: She is oriented to person, place, and time. She appears well-developed and well-nourished.  HENT:  Head: Normocephalic and atraumatic.  Right Ear: External ear normal.  Left Ear: External ear normal.  Nose: Nose normal.  Eyes: EOM are normal. Pupils are equal, round, and reactive to light. Right eye exhibits no discharge. Left eye exhibits no discharge.  Cardiovascular: Normal rate, regular rhythm and normal heart sounds.   Pulmonary/Chest: Effort normal and breath sounds normal.  Abdominal: Soft. She exhibits no distension. There is no tenderness.  Musculoskeletal: She exhibits no edema.   Neurological: She is alert and oriented to person, place, and time.  CN 2-12 grossly intact. 5/5 strength in all 4 extremities. Grossly normal sensation. Alert and oriented x 3  Skin: Skin is warm and dry.  Nursing note and vitals reviewed.   ED Course  Procedures (including critical care time) Labs Review Labs Reviewed  COMPREHENSIVE METABOLIC PANEL - Abnormal; Notable for the following:    Glucose, Bld 113 (*)    Total Protein 6.2 (*)    Albumin 3.3 (*)    Total Bilirubin 1.3 (*)    GFR calc non Af Amer 48 (*)    GFR calc Af Amer 55 (*)    All other components within normal limits  CBC WITH DIFFERENTIAL/PLATELET - Abnormal; Notable for the following:    RDW 15.8 (*)    All other components within normal limits  LIPASE, BLOOD  URINALYSIS, ROUTINE W REFLEX MICROSCOPIC (NOT AT [facility])  I-STAT TROPOININ, ED    Imaging Review Ct Head Wo Contrast  04/05/2015   CLINICAL DATA:  Lethargy, vomiting and nausea for 12 hr.  EXAM: CT HEAD WITHOUT CONTRAST  TECHNIQUE: Contiguous axial images were obtained from the base of the skull through the vertex without intravenous contrast.  COMPARISON:  Head CT 06/28/2014  FINDINGS: No intracranial hemorrhage, mass effect, or midline shift. No hydrocephalus. The basilar cisterns are patent. No evidence of territorial infarct. No intracranial fluid collection. Generalized atrophy, stable in degree from prior exam. Calvarium is intact. Chronic opacification of right side of sphenoid sinus with minimal retained secretions and left sphenoid sinus. Mucosal thickening throughout the ethmoid air cells, mildly progressed. Mastoid air cells are well aerated.  IMPRESSION: 1.  No acute intracranial abnormality.  Stable atrophy. 2. Paranasal sinus inflammatory change, progressed from prior.   Electronically Signed   By: Jeb Levering M.D.   On: 04/05/2015 22:46   Dg Chest Port 1 View  04/05/2015   CLINICAL DATA:  Shortness of breath today.  EXAM: PORTABLE CHEST 1  VIEW  COMPARISON:  December 16, 2014  FINDINGS: The heart size and mediastinal contours are stable. The aorta is tortuous. The heart size is normal. Both lungs are clear. The visualized skeletal structures are stable.  IMPRESSION: No active cardiopulmonary disease.   Electronically Signed   By: Abelardo Diesel M.D.   On: 04/05/2015 22:19   I have personally reviewed and evaluated these images and lab results as part of my medical decision-making.   EKG Interpretation   Date/Time:  Thursday April 05 2015 21:57:50 EDT Ventricular Rate:  78 PR Interval:  203 QRS Duration: 151 QT Interval:  429 QTC Calculation: 489 R Axis:   -84 Text Interpretation:  Sinus rhythm RBBB and LAFB Left ventricular  hypertrophy no significant change since June 2016 Confirmed by Regenia Skeeter   MD, SCOTT [phone]) on 04/05/2015 10:07:03 PM      MDM   Final diagnoses:  None  Family feels like she is "lethargic" but she is awake, alert and oriented like normal. Normal exam. No focal abnormalities and no abdominal tenderness. No vomiting here. Feels better with fluids. Waiting on urine, given no vomiting and benign vitals, plan to discharge home. Care to Dr. Dina Rich with urinalysis pending.    Sherwood Gambler, MD 04/06/15 [phone]

## 2015-04-05 NOTE — ED Notes (Signed)
C/O nausea and vomiting x12 hours as well as lethargy. Pt warm to touch per EMS. BP 142/62, P80, R 18, sats 95% on room air. EKG abnormal. 4 mg zofran given. 18G in left wrist. No complaints of pain/SOB.

## 2015-04-06 ENCOUNTER — Observation Stay (HOSPITAL_COMMUNITY): Payer: Medicare Other

## 2015-04-06 DIAGNOSIS — R531 Weakness: Secondary | ICD-10-CM | POA: Diagnosis not present

## 2015-04-06 DIAGNOSIS — I639 Cerebral infarction, unspecified: Secondary | ICD-10-CM | POA: Diagnosis not present

## 2015-04-06 DIAGNOSIS — R112 Nausea with vomiting, unspecified: Secondary | ICD-10-CM | POA: Diagnosis present

## 2015-04-06 DIAGNOSIS — J4550 Severe persistent asthma, uncomplicated: Secondary | ICD-10-CM | POA: Diagnosis not present

## 2015-04-06 DIAGNOSIS — I6340 Cerebral infarction due to embolism of unspecified cerebral artery: Principal | ICD-10-CM

## 2015-04-06 DIAGNOSIS — R609 Edema, unspecified: Secondary | ICD-10-CM | POA: Diagnosis not present

## 2015-04-06 LAB — COMPREHENSIVE METABOLIC PANEL
ALBUMIN: 3.1 g/dL — AB (ref 3.5–5.0)
ALK PHOS: 51 U/L (ref 38–126)
ALT: 16 U/L (ref 14–54)
ANION GAP: 8 (ref 5–15)
AST: 29 U/L (ref 15–41)
BILIRUBIN TOTAL: 1.5 mg/dL — AB (ref 0.3–1.2)
BUN: 11 mg/dL (ref 6–20)
CALCIUM: 8.6 mg/dL — AB (ref 8.9–10.3)
CO2: 25 mmol/L (ref 22–32)
Chloride: 103 mmol/L (ref 101–111)
Creatinine, Ser: 0.96 mg/dL (ref 0.44–1.00)
GFR calc Af Amer: 58 mL/min — ABNORMAL LOW (ref >60)
GFR calc non Af Amer: 50 mL/min — ABNORMAL LOW (ref >60)
GLUCOSE: 103 mg/dL — AB (ref 65–99)
Potassium: 3.8 mmol/L (ref 3.5–5.1)
SODIUM: 136 mmol/L (ref 135–145)
TOTAL PROTEIN: 5.7 g/dL — AB (ref 6.5–8.1)

## 2015-04-06 LAB — URINE MICROSCOPIC-ADD ON

## 2015-04-06 LAB — CBC WITH DIFFERENTIAL/PLATELET
BASOS ABS: 0 10*3/uL (ref 0.0–0.1)
BASOS PCT: 0 %
EOS ABS: 0.3 10*3/uL (ref 0.0–0.7)
Eosinophils Relative: 4 %
HEMATOCRIT: 41.3 % (ref 36.0–46.0)
HEMOGLOBIN: 13.5 g/dL (ref 12.0–15.0)
Lymphocytes Relative: 25 %
Lymphs Abs: 2 10*3/uL (ref 0.7–4.0)
MCH: 28.9 pg (ref 26.0–34.0)
MCHC: 32.7 g/dL (ref 30.0–36.0)
MCV: 88.4 fL (ref 78.0–100.0)
MONOS PCT: 11 %
Monocytes Absolute: 0.9 10*3/uL (ref 0.1–1.0)
NEUTROS ABS: 5 10*3/uL (ref 1.7–7.7)
NEUTROS PCT: 60 %
Platelets: 237 10*3/uL (ref 150–400)
RBC: 4.67 MIL/uL (ref 3.87–5.11)
RDW: 15.8 % — ABNORMAL HIGH (ref 11.5–15.5)
WBC: 8.1 10*3/uL (ref 4.0–10.5)

## 2015-04-06 LAB — URINALYSIS, ROUTINE W REFLEX MICROSCOPIC
Bilirubin Urine: NEGATIVE
Glucose, UA: NEGATIVE mg/dL
Hgb urine dipstick: NEGATIVE
KETONES UR: 15 mg/dL — AB
NITRITE: NEGATIVE
PH: 7.5 (ref 5.0–8.0)
PROTEIN: NEGATIVE mg/dL
Specific Gravity, Urine: 1.017 (ref 1.005–1.030)
Urobilinogen, UA: 1 mg/dL (ref 0.0–1.0)

## 2015-04-06 MED ORDER — FLUTICASONE FUROATE-VILANTEROL 200-25 MCG/INH IN AEPB: 1.0000 | INHALATION_SPRAY | Freq: Every day | RESPIRATORY_TRACT | Status: DC

## 2015-04-06 MED ORDER — TRAZODONE HCL 50 MG PO TABS: 50.0000 mg | ORAL_TABLET | Freq: Every evening | ORAL | Status: DC | PRN

## 2015-04-06 MED ORDER — IPRATROPIUM-ALBUTEROL 0.5-2.5 (3) MG/3ML IN SOLN
3.0000 mL | Freq: Two times a day (BID) | RESPIRATORY_TRACT | Status: DC
  Administered 2015-04-06 – 2015-04-10 (×7): 3 mL via RESPIRATORY_TRACT
  Filled 2015-04-06 (×8): qty 3

## 2015-04-06 MED ORDER — ENOXAPARIN SODIUM 40 MG/0.4ML ~~LOC~~ SOLN
40.0000 mg | SUBCUTANEOUS | Status: DC
  Administered 2015-04-06: 40 mg via SUBCUTANEOUS
  Filled 2015-04-06: qty 0.4

## 2015-04-06 MED ORDER — SODIUM CHLORIDE 0.9 % IV SOLN
INTRAVENOUS | Status: AC
  Administered 2015-04-06: 05:00:00 via INTRAVENOUS

## 2015-04-06 MED ORDER — ACETAMINOPHEN 650 MG RE SUPP: 650.0000 mg | Freq: Four times a day (QID) | RECTAL | Status: DC | PRN

## 2015-04-06 MED ORDER — ONDANSETRON HCL 4 MG/2ML IJ SOLN: 4.0000 mg | Freq: Four times a day (QID) | INTRAMUSCULAR | Status: DC | PRN

## 2015-04-06 MED ORDER — ACETAMINOPHEN 325 MG PO TABS: 650.0000 mg | ORAL_TABLET | Freq: Four times a day (QID) | ORAL | Status: DC | PRN

## 2015-04-06 MED ORDER — ONDANSETRON HCL 4 MG PO TABS: 4.0000 mg | ORAL_TABLET | Freq: Four times a day (QID) | ORAL | Status: DC | PRN

## 2015-04-06 MED ORDER — ONDANSETRON HCL 4 MG/2ML IJ SOLN
4.0000 mg | Freq: Once | INTRAMUSCULAR | Status: AC
  Administered 2015-04-06: 4 mg via INTRAVENOUS
  Filled 2015-04-06: qty 2

## 2015-04-06 MED ORDER — STROKE: EARLY STAGES OF RECOVERY BOOK
Freq: Once | Status: AC
  Administered 2015-04-06: 17:00:00

## 2015-04-06 MED ORDER — ASPIRIN EC 325 MG PO TBEC
325.0000 mg | DELAYED_RELEASE_TABLET | Freq: Every day | ORAL | Status: DC
  Administered 2015-04-06 – 2015-04-10 (×5): 325 mg via ORAL
  Filled 2015-04-06 (×5): qty 1

## 2015-04-06 MED ORDER — ALBUTEROL SULFATE (2.5 MG/3ML) 0.083% IN NEBU: 2.5000 mg | INHALATION_SOLUTION | RESPIRATORY_TRACT | Status: DC | PRN

## 2015-04-06 MED ORDER — CALCIUM CARBONATE 1250 (500 CA) MG PO TABS
500.0000 mg | ORAL_TABLET | Freq: Every day | ORAL | Status: DC
  Administered 2015-04-06 – 2015-04-10 (×5): 500 mg via ORAL
  Filled 2015-04-06 (×5): qty 1

## 2015-04-06 MED ORDER — PREDNISONE 5 MG PO TABS
5.0000 mg | ORAL_TABLET | Freq: Every day | ORAL | Status: DC
  Administered 2015-04-06 – 2015-04-10 (×5): 5 mg via ORAL
  Filled 2015-04-06 (×5): qty 1

## 2015-04-06 MED ORDER — MOMETASONE FURO-FORMOTEROL FUM 100-5 MCG/ACT IN AERO
2.0000 | INHALATION_SPRAY | Freq: Two times a day (BID) | RESPIRATORY_TRACT | Status: DC
  Administered 2015-04-06 – 2015-04-10 (×8): 2 via RESPIRATORY_TRACT
  Filled 2015-04-06: qty 8.8

## 2015-04-06 MED ORDER — ONDANSETRON 4 MG PO TBDP: 4.0000 mg | ORAL_TABLET | Freq: Three times a day (TID) | ORAL | Status: DC | PRN

## 2015-04-06 NOTE — Care Management Note (Signed)
Case Management Note  Patient Details  Name: Eirene Rather MRN: [id number] Date of Birth: 1922-07-05  Subjective/Objective:                    Action/Plan: Patient admitted with generalized weakness. Patient is from home with spouse. Awaiting PT/OT recommendations for discharge disposition. CM will continue to follow for discharge needs.   Expected Discharge Date:                  Expected Discharge Plan:  Home/Self Care  In-House Referral:     Discharge planning Services     Post Acute Care Choice:    Choice offered to:     DME Arranged:    DME Agency:     HH Arranged:    HH Agency:     Status of Service:  In process, will continue to follow  Medicare Important Message Given:    Date Medicare IM Given:    Medicare IM give by:    Date Additional Medicare IM Given:    Additional Medicare Important Message give by:     If discussed at [place] of Stay Meetings, dates discussed:    Additional Comments:  Pollie Friar, RN 04/06/2015, 10:19 AM

## 2015-04-06 NOTE — Progress Notes (Addendum)
PATIENT DETAILS Name: Darlene Murray Age: 81 y.o. Sex: female Date of Birth: 01-26-1922 Admit Date: 04/05/2015 Admitting Physician Lavina Hamman, MD QRF:[id number],PQDIY Marigene Ehlers, MD  Subjective: No vomiting overnight. Denies abd pain.  Assessment/Plan: Principal Problem: Generalized weakness:suspect secondary to dehydration, advance age/frailty.CT head negative, UA/CXR neg. Per PT-very unsteady-with neg orthostatic vital signs. Non focal exam-but given persistent weakness/unsteady gait-will check MRI Brain to rule out CVA.   Addendum 4:30 pm MRI Brain:result noted, have consulted Neuro.  Active Problems: Nausea/Vomting:Resolved with supportive measures. Tolerated regular diet. Belly is benign on exam.  Severe persistent asthma in adult steroid dependent:lungs clear, continue with prednisone and bronchodilators.  Disposition: Remain inpatient  Antimicrobial agents  See below  Anti-infectives    None      DVT Prophylaxis: Prophylactic Lovenox   Code Status: Full code-for now-will need to clarify with patient when family at bedside  Family Communication None at bedside  Procedures: None  CONSULTS:  None  Time spent 30 minutes-Greater than 50% of this time was spent in counseling, explanation of diagnosis, planning of further management, and coordination of care.  MEDICATIONS: Scheduled Meds: . enoxaparin (LOVENOX) injection  40 mg Subcutaneous Q24H  . mometasone-formoterol  2 puff Inhalation BID  . predniSONE  5 mg Oral Daily   Continuous Infusions: . sodium chloride 50 mL/hr at 04/06/15 0528   PRN Meds:.acetaminophen **OR** acetaminophen, ondansetron **OR** ondansetron (ZOFRAN) IV    PHYSICAL EXAM: Vital signs in last 24 hours: Filed Vitals:   04/06/15 0400 04/06/15 0415 04/06/15 0449 04/06/15 0926  BP: 146/58 132/65 150/67 135/57  Pulse: 65 66 78 68  Temp:   98.2 F (36.8 C) 98 F (36.7 C)  TempSrc:   Oral Oral  Resp: 13 17  17 18   Height:   5\' 2"  (1.575 m)   Weight:   80.377 kg (177 lb 3.2 oz)   SpO2: 100% 99% 98% 99%    Weight change:  Filed Weights   04/05/15 2159 04/06/15 0449  Weight: 78.472 kg (173 lb) 80.377 kg (177 lb 3.2 oz)   Body mass index is 32.4 kg/(m^2).   Gen Exam: Awake and alert with clear speech. Neck: Supple, No JVD.   Chest: B/L Clear.   CVS: S1 S2 Regular, no murmurs.  Abdomen: soft, BS +, non tender, non distended.  Extremities: no edema, lower extremities warm to touch. Neurologic: Non Focal.   Skin: No Rash.   Wounds: N/A.   Intake/Output from previous day:  Intake/Output Summary (Last 24 hours) at 04/06/15 1251 Last data filed at 04/06/15 1007  Gross per 24 hour  Intake    240 ml  Output      0 ml  Net    240 ml     LAB RESULTS: CBC  Recent Labs Lab 04/05/15 2252 04/06/15 0520  WBC 8.2 8.1  HGB 14.2 13.5  HCT 43.0 41.3  PLT 244 237  MCV 89.0 88.4  MCH 29.4 28.9  MCHC 33.0 32.7  RDW 15.8* 15.8*  LYMPHSABS 1.6 2.0  MONOABS 0.9 0.9  EOSABS 0.2 0.3  BASOSABS 0.0 0.0    Chemistries   Recent Labs Lab 04/05/15 2252 04/06/15 0520  NA 136 136  K 3.8 3.8  CL 103 103  CO2 25 25  GLUCOSE 113* 103*  BUN 13 11  CREATININE 1.00 0.96  CALCIUM 8.9 8.6*    CBG: No results for input(s): GLUCAP in  the last 168 hours.  GFR Estimated Creatinine Clearance: 37.5 mL/min (by C-G formula based on Cr of 0.96).  Coagulation profile No results for input(s): INR, PROTIME in the last 168 hours.  Cardiac Enzymes No results for input(s): CKMB, TROPONINI, MYOGLOBIN in the last 168 hours.  Invalid input(s): CK  Invalid input(s): POCBNP No results for input(s): DDIMER in the last 72 hours. No results for input(s): HGBA1C in the last 72 hours. No results for input(s): CHOL, HDL, LDLCALC, TRIG, CHOLHDL, LDLDIRECT in the last 72 hours. No results for input(s): TSH, T4TOTAL, T3FREE, THYROIDAB in the last 72 hours.  Invalid input(s): FREET3 No results for  input(s): VITAMINB12, FOLATE, FERRITIN, TIBC, IRON, RETICCTPCT in the last 72 hours.  Recent Labs  04/05/15 2252  LIPASE 23    Urine Studies No results for input(s): UHGB, CRYS in the last 72 hours.  Invalid input(s): UACOL, UAPR, USPG, UPH, UTP, UGL, UKET, UBIL, UNIT, UROB, ULEU, UEPI, UWBC, URBC, UBAC, CAST, UCOM, BILUA  MICROBIOLOGY: No results found for this or any previous visit (from the past 240 hour(s)).  RADIOLOGY STUDIES/RESULTS: Ct Head Wo Contrast  04/05/2015   CLINICAL DATA:  Lethargy, vomiting and nausea for 12 hr.  EXAM: CT HEAD WITHOUT CONTRAST  TECHNIQUE: Contiguous axial images were obtained from the base of the skull through the vertex without intravenous contrast.  COMPARISON:  Head CT 06/28/2014  FINDINGS: No intracranial hemorrhage, mass effect, or midline shift. No hydrocephalus. The basilar cisterns are patent. No evidence of territorial infarct. No intracranial fluid collection. Generalized atrophy, stable in degree from prior exam. Calvarium is intact. Chronic opacification of right side of sphenoid sinus with minimal retained secretions and left sphenoid sinus. Mucosal thickening throughout the ethmoid air cells, mildly progressed. Mastoid air cells are well aerated.  IMPRESSION: 1.  No acute intracranial abnormality.  Stable atrophy. 2. Paranasal sinus inflammatory change, progressed from prior.   Electronically Signed   By: Jeb Levering M.D.   On: 04/05/2015 22:46   Dg Chest Port 1 View  04/05/2015   CLINICAL DATA:  Shortness of breath today.  EXAM: PORTABLE CHEST 1 VIEW  COMPARISON:  December 16, 2014  FINDINGS: The heart size and mediastinal contours are stable. The aorta is tortuous. The heart size is normal. Both lungs are clear. The visualized skeletal structures are stable.  IMPRESSION: No active cardiopulmonary disease.   Electronically Signed   By: Abelardo Diesel M.D.   On: 04/05/2015 22:19    Oren Binet, MD  [facility] Pager:[phone]  [phone]  If 7PM-7AM, please contact night-coverage [email or web address] Password [email or web address] 04/06/2015, 12:51 PM

## 2015-04-06 NOTE — ED Provider Notes (Addendum)
Patient signed out pending urinalysis. Urinalysis without evidence of urinary tract infection. Patient has not had any vomiting while the ER. Her lab work was reviewed and is reassuring. Chest x-ray and CT reassuring as well. Family states that she is unable to ambulate. Upon attempt to ambulate the patient, nursing noted that she required greater than 50% assist. She is nonfocal.  Given reassuring workup, weakness may be related to deconditioning and mild dehydration. Will admit for observation and physical therapy.   EKG Interpretation  Date/Time:  Friday April 06 2015 03:10:41 EDT Ventricular Rate:  74 PR Interval:  194 QRS Duration: 152 QT Interval:  444 QTC Calculation: 493 R Axis:   -87 Text Interpretation:  Sinus rhythm RBBB and LAFB Probable left ventricular hypertrophy No significant change since last tracing Confirmed by Sayer Masini  MD, Loma Sousa ([id number]) on 04/06/2015 3:40:25 AM        Merryl Hacker, MD 04/06/15 0340  Patient is FULL CODE.  Merryl Hacker, MD 04/06/15 2256

## 2015-04-06 NOTE — Consult Note (Signed)
Neurology Consultation Reason for Consult: Stroke Referring Physician: Tera Helper, S  CC: Stroke  History is obtained from: Patient, husband  HPI: Darlene Murray is a 81 y.o. female who presented yesterday after waking up at 6 AM with nausea and vomiting. She also noticed that she had trouble walking and felt like her voice was "weak." In the emergency room, this was felt to be likely due to her nausea and vomiting a possible gastritis and she was admitted for rehydration. She did have some improvement overnight, however when walking with physical therapy was noted that she was still very unsteady and therefore an MRI of the brain was obtained. This revealed multifocal infarcts and therefore neurology has been consulted.   LKW: 9/28 prior to bed tpa given?: no, out of window    ROS: A 14 point ROS was performed and is negative except as noted in the HPI.   Past Medical History  Diagnosis Date  . Blindness   . Asthmatic bronchitis   . Asthma   . Fibrocystic breast disease   . Allergic rhinitis   . Hyperlipidemia   . Venous insufficiency     lower extremities  . HNP (herniated nucleus pulposus), lumbar     L4-L5  . Left ventricular hypertrophy 2008    mild  . Antibiotic-associated diarrhea      Family History  Problem Relation Age of Onset  . Heart attack Sister   . Alzheimer's disease Sister   . Leukemia Mother   . Asthma Son      Social History:  reports that she has never smoked. She has never used smokeless tobacco. She reports that she drinks alcohol. She reports that she does not use illicit drugs.   Exam: Current vital signs: BP 152/57 mmHg  Pulse 67  Temp(Src) 98 F (36.7 C) (Oral)  Resp 18  Ht 5\' 2"  (1.575 m)  Wt 80.377 kg (177 lb 3.2 oz)  BMI 32.40 kg/m2  SpO2 100% Vital signs in last 24 hours: Temp:  [98 F (36.7 C)-98.4 F (36.9 C)] 98 F (36.7 C) (09/30 1318) Pulse Rate:  [65-78] 67 (09/30 1318) Resp:  [12-20] 18 (09/30 1318) BP:  (113-164)/(57-109) 152/57 mmHg (09/30 1318) SpO2:  [98 %-100 %] 100 % (09/30 1318) Weight:  [78.472 kg (173 lb)-80.377 kg (177 lb 3.2 oz)] 80.377 kg (177 lb 3.2 oz) (09/30 0449)   Physical Exam  Constitutional: Appears elderly Psych: Affect appropriate to situation Eyes: No scleral injection HENT: No OP obstrucion Head: Normocephalic.  Cardiovascular: Normal rate and regular rhythm.  Respiratory: Effort normal and breath sounds normal to anterior ascultation GI: Soft.  No distension. There is no tenderness.  Skin: WDI  Neuro: Mental Status: Patient is awake, alert, oriented to person, place, month, year, and situation. Patient is able to give a clear and coherent history. No signs of aphasia or neglect Cranial Nerves: II: Visual Fields are full. Pupils are equal, round, and reactive to light.   III,IV, VI: She has a prominent left hypermetria(baseline per patient) V: Facial sensation is symmetric to temperature VII: Facial movement is symmetric.  VIII: hearing is intact to voice X: Uvula elevates symmetrically XI: Shoulder shrug is symmetric. XII: tongue is midline without atrophy or fasciculations.  Motor: Tone is normal. Bulk is normal. 5/5 strength was present in all four extremities.  Sensory: Sensation is symmetric to light touch  Cerebellar: FNF with ataxia on the right, intact on the left   I have reviewed labs in epic and  the results pertinent to this consultation are: CBC-unremarkable  I have reviewed the images obtained: MRI of the brain-multifocal infarcts in both anterior and posterior circulation.  Impression: 81 year old female with likely cardioembolic infarcts. She will need evaluation for sources of cardiac emboli. In the interim she will need antiplatelet therapy with aspirin. There is some confluent petechial hemorrhage, but this is just petechial and therefore I would not consider it a contraindication to antiplatelet therapy.   1. HgbA1c, fasting  lipid panel 2. Frequent neuro checks 3. Echocardiogram 4. Carotid dopplers are not needed given that the posterior circulation is involved as well, and low likelihood of her undergoing carotid ndarterectomy. 5. Prophylactic therapy-Antiplatelet med: Aspirin - dose 325mg  PO or 300mg  PR 6. Risk factor modification 7. Telemetry monitoring 8. PT consult, OT consult, Speech consult    Roland Rack, MD [facility] [phone]  If 7pm- 7am, please page neurology on call as listed in [place].

## 2015-04-06 NOTE — ED Notes (Signed)
Pt given ginger ale on ice. So far, patient is tolerating well.

## 2015-04-06 NOTE — ED Notes (Signed)
Attempted to ambulate patient. She was unable to make it to the door of her room. Greater than 50% of weightbearing on staff, unsteady with jerky movements. Family reports that patient has never used cane or walker and is typically very independent. Pt became nauseous and vomited upon returning to bed.

## 2015-04-06 NOTE — ED Notes (Signed)
Attempted to call report

## 2015-04-06 NOTE — Progress Notes (Signed)
Pt arrived to unit alert and oriented. Oriented to room. Call bell at side. Bed alarm activated. Questions answered. Will continue to monitor. Hayles, Imani M, RN 

## 2015-04-06 NOTE — H&P (Signed)
[facility] History and Physical  Patient: Darlene Murray  MRN: [id number]  DOB: [birth year]  DOS: the patient was seen and examined on 04/06/2015 PCP: Gennette Pac, MD  Referring physician: Dr. Dina Rich Chief Complaint: Generalized weakness  HPI: [name] is a 81 y.o. female with Past medical history of blindness, asthma, dyslipidemia. The patient is presenting with compressive generalized weakness. His ongoing since today. The patient started having nausea followed by vomiting earlier in the day. She had 6 episodes of vomiting with no blood. She did have some dizziness during the day. She denies having any fever or chills no diarrhea or constipation or burning urination. Denies any recent change in her medication. Although the patient has not been able to eat or drink anything. With this patient has developed progressively worsening weakness. Therefore they brought her to the hospital. The patient generally at her baseline ambulates without any support and also takes her ADL herself.  The patient is coming from home.  At her baseline ambulates without support And is independent for most of her ADL; manages her medication on her own.  Review of Systems: as mentioned in the history of present illness.  A comprehensive review of the other systems is negative.  Past Medical History  Diagnosis Date  . Blindness   . Asthmatic bronchitis   . Asthma   . Fibrocystic breast disease   . Allergic rhinitis   . Hyperlipidemia   . Venous insufficiency     lower extremities  . HNP (herniated nucleus pulposus), lumbar     L4-L5  . Left ventricular hypertrophy 2008    mild  . Antibiotic-associated diarrhea    History reviewed. No pertinent past surgical history. Social History:  reports that she has never smoked. She has never used smokeless tobacco. She reports that she drinks alcohol. She reports that she does not use illicit drugs.  No Known  Allergies  Family History  Problem Relation Age of Onset  . Heart attack Sister   . Alzheimer's disease Sister   . Leukemia Mother   . Asthma Son     Prior to Admission medications   Medication Sig Start Date End Date Taking? Authorizing Provider  albuterol (PROVENTIL HFA;VENTOLIN HFA) 108 (90 BASE) MCG/ACT inhaler Inhale 2 puffs into the lungs every 6 (six) hours as needed for wheezing or shortness of breath. 10/16/14  Yes Elsie Stain, MD  Fluticasone Furoate-Vilanterol (BREO ELLIPTA) 200-25 MCG/INH AEPB Inhale 1 puff into the lungs daily. 02/28/15  Yes Elsie Stain, MD  predniSONE (DELTASONE) 5 MG tablet Take 1 tablet (5 mg total) by mouth daily. 02/28/15  Yes Elsie Stain, MD  calcium carbonate (OS-CAL) 600 MG TABS Take 600 mg by mouth daily.      Historical Provider, MD  Multiple Vitamin (MULTIVITAMIN) tablet Take 1 tablet by mouth daily.    Historical Provider, MD  ondansetron (ZOFRAN ODT) 4 MG disintegrating tablet Take 1 tablet (4 mg total) by mouth every 8 (eight) hours as needed for nausea or vomiting. 04/06/15   Merryl Hacker, MD  Pyridoxine HCl (VITAMIN B-6 PO) Take 1 tablet by mouth daily.    Historical Provider, MD  traZODone (DESYREL) 50 MG tablet Take 50 mg by mouth at bedtime as needed.     Historical Provider, MD  triamterene-hydrochlorothiazide (MAXZIDE-25) 37.5-25 MG per tablet 1 tablet in the morning as needed ankle swelling     Historical Provider, MD    Physical Exam: Filed Vitals:   04/06/15  0345 04/06/15 0400 04/06/15 0415 04/06/15 0449  BP: 123/70 146/58 132/65 150/67  Pulse: 65 65 66 78  Temp:    98.2 F (36.8 C)  TempSrc:    Oral  Resp: 13 13 17 17   Height:    5\' 2"  (1.575 m)  Weight:    80.377 kg (177 lb 3.2 oz)  SpO2: 99% 100% 99% 98%    General: Alert, Awake and Oriented to Time, Place and Person. Appear in mild distress Eyes: PERRL ENT: Oral Mucosa clear moist. Neck: no JVD Cardiovascular: S1 and S2 Present, no Murmur, Peripheral  Pulses Present Respiratory: Bilateral Air entry equal and Decreased,  Clear to Auscultation, no Crackles, no wheezes Abdomen: Bowel Sound present, Soft and no tenderness Skin: no Rash Extremities: Bilateral Pedal edema, no calf tenderness Neurologic: Grossly no focal neuro deficit.  Labs on Admission:  CBC:  Recent Labs Lab 04/05/15 2252 04/06/15 0520  WBC 8.2 8.1  NEUTROABS 5.5 5.0  HGB 14.2 13.5  HCT 43.0 41.3  MCV 89.0 88.4  PLT 244 237    CMP     Component Value Date/Time   NA 136 04/06/2015 0520   K 3.8 04/06/2015 0520   CL 103 04/06/2015 0520   CO2 25 04/06/2015 0520   GLUCOSE 103* 04/06/2015 0520   BUN 11 04/06/2015 0520   CREATININE 0.96 04/06/2015 0520   CALCIUM 8.6* 04/06/2015 0520   PROT 5.7* 04/06/2015 0520   ALBUMIN 3.1* 04/06/2015 0520   AST 29 04/06/2015 0520   ALT 16 04/06/2015 0520   ALKPHOS 51 04/06/2015 0520   BILITOT 1.5* 04/06/2015 0520   GFRNONAA 50* 04/06/2015 0520   GFRAA 58* 04/06/2015 0520    No results for input(s): CKTOTAL, CKMB, CKMBINDEX, TROPONINI in the last 168 hours. BNP (last 3 results)  Recent Labs  12/16/14 1025  BNP 14.1    ProBNP (last 3 results) No results for input(s): PROBNP in the last 8760 hours.   Radiological Exams on Admission: Ct Head Wo Contrast  04/05/2015   CLINICAL DATA:  Lethargy, vomiting and nausea for 12 hr.  EXAM: CT HEAD WITHOUT CONTRAST  TECHNIQUE: Contiguous axial images were obtained from the base of the skull through the vertex without intravenous contrast.  COMPARISON:  Head CT 06/28/2014  FINDINGS: No intracranial hemorrhage, mass effect, or midline shift. No hydrocephalus. The basilar cisterns are patent. No evidence of territorial infarct. No intracranial fluid collection. Generalized atrophy, stable in degree from prior exam. Calvarium is intact. Chronic opacification of right side of sphenoid sinus with minimal retained secretions and left sphenoid sinus. Mucosal thickening throughout the  ethmoid air cells, mildly progressed. Mastoid air cells are well aerated.  IMPRESSION: 1.  No acute intracranial abnormality.  Stable atrophy. 2. Paranasal sinus inflammatory change, progressed from prior.   Electronically Signed   By: Jeb Levering M.D.   On: 04/05/2015 22:46   Dg Chest Port 1 View  04/05/2015   CLINICAL DATA:  Shortness of breath today.  EXAM: PORTABLE CHEST 1 VIEW  COMPARISON:  December 16, 2014  FINDINGS: The heart size and mediastinal contours are stable. The aorta is tortuous. The heart size is normal. Both lungs are clear. The visualized skeletal structures are stable.  IMPRESSION: No active cardiopulmonary disease.   Electronically Signed   By: Abelardo Diesel M.D.   On: 04/05/2015 22:19   EKG: Independently reviewed. normal sinus rhythm, nonspecific ST and T waves changes.  Assessment/Plan 1. Generalized weakness The patient is presenting with numbness of  bilateral generalized weakness. The CT scan of the head is unremarkable. Patient does have difficulty ambulating well which is not her baseline. With this possible etiologies generalized weakness secondary to nausea and vomiting leading to dehydration. With this the patient will be admitted in hospital. We will give her gentle IV hydration. Physical therapy consultation in the morning.  2.  Nausea with vomiting Probable gastritis. We will continue when necessary Zofran.  3 steroid independent asthma. At present does not appear to be having any exacerbation. We'll continue with prednisone as well as a inhalers.  Nutrition: Regular diet DVT Prophylaxis: subcutaneous Heparin  Advance goals of care discussion: Full code   Family Communication: family was present at bedside, opportunity was given to ask question and all questions were answered satisfactorily at the time of interview. Disposition: Admitted as observation, med-surge unit.  Author: Berle Mull, MD [facility] Pager:  [phone] 04/06/2015  If 7PM-7AM, please contact night-coverage [email or web address] Password [email or web address]

## 2015-04-06 NOTE — Discharge Instructions (Signed)
You were seen today for nausea and vomiting. Your workup is reassuring. You had not had any vomiting here. He need follow-up with your primary physician if he has continued fatigue, vomiting, or nausea.   Nausea and Vomiting Nausea is a sick feeling that often comes before throwing up (vomiting). Vomiting is a reflex where stomach contents come out of your mouth. Vomiting can cause severe loss of body fluids (dehydration). Children and elderly adults can become dehydrated quickly, especially if they also have diarrhea. Nausea and vomiting are symptoms of a condition or disease. It is important to find the cause of your symptoms. CAUSES   Direct irritation of the stomach lining. This irritation can result from increased acid production (gastroesophageal reflux disease), infection, food poisoning, taking certain medicines (such as nonsteroidal anti-inflammatory drugs), alcohol use, or tobacco use.  Signals from the brain.These signals could be caused by a headache, heat exposure, an inner ear disturbance, increased pressure in the brain from injury, infection, a tumor, or a concussion, pain, emotional stimulus, or metabolic problems.  An obstruction in the gastrointestinal tract (bowel obstruction).  Illnesses such as diabetes, hepatitis, gallbladder problems, appendicitis, kidney problems, cancer, sepsis, atypical symptoms of a heart attack, or eating disorders.  Medical treatments such as chemotherapy and radiation.  Receiving medicine that makes you sleep (general anesthetic) during surgery. DIAGNOSIS Your caregiver may ask for tests to be done if the problems do not improve after a few days. Tests may also be done if symptoms are severe or if the reason for the nausea and vomiting is not clear. Tests may include:  Urine tests.  Blood tests.  Stool tests.  Cultures (to look for evidence of infection).  X-rays or other imaging studies. Test results can help your caregiver make decisions  about treatment or the need for additional tests. TREATMENT You need to stay well hydrated. Drink frequently but in small amounts.You may wish to drink water, sports drinks, clear broth, or eat frozen ice pops or gelatin dessert to help stay hydrated.When you eat, eating slowly may help prevent nausea.There are also some antinausea medicines that may help prevent nausea. HOME CARE INSTRUCTIONS   Take all medicine as directed by your caregiver.  If you do not have an appetite, do not force yourself to eat. However, you must continue to drink fluids.  If you have an appetite, eat a normal diet unless your caregiver tells you differently.  Eat a variety of complex carbohydrates (rice, wheat, potatoes, bread), lean meats, yogurt, fruits, and vegetables.  Avoid high-fat foods because they are more difficult to digest.  Drink enough water and fluids to keep your urine clear or pale yellow.  If you are dehydrated, ask your caregiver for specific rehydration instructions. Signs of dehydration may include:  Severe thirst.  Dry lips and mouth.  Dizziness.  Dark urine.  Decreasing urine frequency and amount.  Confusion.  Rapid breathing or pulse. SEEK IMMEDIATE MEDICAL CARE IF:   You have blood or brown flecks (like coffee grounds) in your vomit.  You have black or bloody stools.  You have a severe headache or stiff neck.  You are confused.  You have severe abdominal pain.  You have chest pain or trouble breathing.  You do not urinate at least once every 8 hours.  You develop cold or clammy skin.  You continue to vomit for longer than 24 to 48 hours.  You have a fever. MAKE SURE YOU:   Understand these instructions.  Will watch your  condition.  Will get help right away if you are not doing well or get worse. Document Released: 06/23/2005 Document Revised: 09/15/2011 Document Reviewed: 11/20/2010 [facility] Patient Information 2015 [place], [place]. This information  is not intended to replace advice given to you by your health care provider. Make sure you discuss any questions you have with your health care provider.

## 2015-04-06 NOTE — Evaluation (Signed)
Physical Therapy Evaluation Patient Details Name: Darlene Murray MRN: [id number] DOB: January 05, 1922 Today's Date: 04/06/2015   History of Present Illness  Darlene Murray is a 81 y.o. female with Past medical history of blindness, asthma, dyslipidemia. The patient is presenting with progressive generalized weakness  Clinical Impression  Pt admitted with the above complications. Pt currently with functional limitations due to the deficits listed below (see PT Problem List). Unable to tolerate standing greater than 30 seconds due to symptoms of dizziness, orthostatics stable from sit to stand. -4/5 RUE weakness compared to 5/5 LUE. Poor truncal control and coordination of steps (ataxic) with pivot to chair requiring mod assist to prevent fall. Pt will benefit from skilled PT to increase their independence and safety with mobility to allow discharge to the venue listed below.       Follow Up Recommendations SNF    Equipment Recommendations  None recommended by PT    Recommendations for Other Services       Precautions / Restrictions Precautions Precautions: None Restrictions Weight Bearing Restrictions: No      Mobility  Bed Mobility Overal bed mobility: Needs Assistance Bed Mobility: Supine to Sit     Supine to sit: Min assist     General bed mobility comments: Min assist to scoot to edge of bed. Fatigues easily  Transfers Overall transfer level: Needs assistance Equipment used: Rolling walker (2 wheeled) Transfers: Sit to/from [facility] Sit to Stand: Min assist Stand pivot transfers: Mod assist       General transfer comment: Min assist for boost to stand from bed with UE assist to place on RW for support. Mod assist due to loss of balance tipping to left during pivot transfer to chair. Performed stand x3 with poor truncal control and tolerance to standing <30 seconds each attempt.  Ambulation/Gait             General Gait Details:  Unsafe at this time.  Stairs            Wheelchair Mobility    Modified Rankin (Stroke Patients Only) Modified Rankin (Stroke Patients Only) Pre-Morbid Rankin Score: No significant disability Modified Rankin: Moderately severe disability     Balance Overall balance assessment: Needs assistance Sitting-balance support: No upper extremity supported;Feet supported Sitting balance-Leahy Scale: Fair Sitting balance - Comments: difficulty returning to midline for Lt lateral lean   Standing balance support: Bilateral upper extremity supported Standing balance-Leahy Scale: Poor                               Pertinent Vitals/Pain Pain Assessment: No/denies pain    Home Living Family/patient expects to be discharged to:: Private residence Living Arrangements: Spouse/significant other Available Help at Discharge: Family;Available 24 hours/day Type of Home: House Home Access: Level entry     Home Layout: One level Home Equipment: Walker - 2 wheels;Cane - single point;Grab bars - tub/shower      Prior Function Level of Independence: Independent               Hand Dominance   Dominant Hand: Right    Extremity/Trunk Assessment   Upper Extremity Assessment: Defer to OT evaluation (RUE weakness, states not baseline)           Lower Extremity Assessment: Generalized weakness         Communication   Communication: No difficulties  Cognition Arousal/Alertness: Awake/alert Behavior During Therapy: WFL for tasks assessed/performed Overall  Cognitive Status: Within Functional Limits for tasks assessed                      General Comments General comments (skin integrity, edema, etc.): Reports significant dizziness upon standing. Resolves quickly upon sitting. Poor foot placement. No significant change in BP between sit<>stand.    Exercises        Assessment/Plan    PT Assessment Patient needs continued PT services  PT Diagnosis  Difficulty walking;Generalized weakness   PT Problem List Decreased strength;Decreased range of motion;Decreased activity tolerance;Decreased balance;Decreased mobility;Decreased coordination;Decreased knowledge of use of DME  PT Treatment Interventions DME instruction;Gait training;Functional mobility training;Therapeutic activities;Therapeutic exercise;Balance training;Neuromuscular re-education;Patient/family education   PT Goals (Current goals can be found in the Care Plan section) Acute Rehab PT Goals Patient Stated Goal: Go home PT Goal Formulation: With patient Time For Goal Achievement: 04/20/15 Potential to Achieve Goals: Good    Frequency Min 4X/week   Barriers to discharge        Co-evaluation               End of Session Equipment Utilized During Treatment: Gait belt Activity Tolerance: Other (comment) (Limited by dizziness) Patient left: in chair;with call bell/phone within reach;with chair alarm set Nurse Communication: Mobility status    Functional Assessment Tool Used: clinical observation Functional Limitation: Mobility: Walking and moving around Mobility: Walking and Moving Around Current Status [phone]): At least 40 percent but less than 60 percent impaired, limited or restricted Mobility: Walking and Moving Around Goal Status [phone]): At least 1 percent but less than 20 percent impaired, limited or restricted    Time: 4709-6283 PT Time Calculation (min) (ACUTE ONLY): 30 min   Charges:   PT Evaluation $Initial PT Evaluation Tier I: 1 Procedure PT Treatments $Therapeutic Activity: 8-22 mins   PT G Codes:   PT G-Codes **NOT FOR INPATIENT CLASS** Functional Assessment Tool Used: clinical observation Functional Limitation: Mobility: Walking and moving around Mobility: Walking and Moving Around Current Status ([id number]): At least 40 percent but less than 60 percent impaired, limited or restricted Mobility: Walking and Moving Around Goal Status [phone]): At  least 1 percent but less than 20 percent impaired, limited or restricted    Ellouise Newer 04/06/2015, 11:05 AM Elayne Snare, [place]

## 2015-04-07 ENCOUNTER — Inpatient Hospital Stay (HOSPITAL_COMMUNITY): Payer: Medicare Other

## 2015-04-07 ENCOUNTER — Observation Stay (HOSPITAL_COMMUNITY): Payer: Medicare Other

## 2015-04-07 DIAGNOSIS — I10 Essential (primary) hypertension: Secondary | ICD-10-CM | POA: Diagnosis present

## 2015-04-07 DIAGNOSIS — I639 Cerebral infarction, unspecified: Secondary | ICD-10-CM | POA: Diagnosis present

## 2015-04-07 DIAGNOSIS — Z7952 Long term (current) use of systemic steroids: Secondary | ICD-10-CM | POA: Diagnosis not present

## 2015-04-07 DIAGNOSIS — E669 Obesity, unspecified: Secondary | ICD-10-CM | POA: Diagnosis present

## 2015-04-07 DIAGNOSIS — E785 Hyperlipidemia, unspecified: Secondary | ICD-10-CM | POA: Diagnosis not present

## 2015-04-07 DIAGNOSIS — H540 Blindness, both eyes: Secondary | ICD-10-CM | POA: Diagnosis present

## 2015-04-07 DIAGNOSIS — I6330 Cerebral infarction due to thrombosis of unspecified cerebral artery: Secondary | ICD-10-CM

## 2015-04-07 DIAGNOSIS — J309 Allergic rhinitis, unspecified: Secondary | ICD-10-CM | POA: Diagnosis present

## 2015-04-07 DIAGNOSIS — J4550 Severe persistent asthma, uncomplicated: Secondary | ICD-10-CM | POA: Diagnosis not present

## 2015-04-07 DIAGNOSIS — I6503 Occlusion and stenosis of bilateral vertebral arteries: Secondary | ICD-10-CM | POA: Diagnosis not present

## 2015-04-07 DIAGNOSIS — I6340 Cerebral infarction due to embolism of unspecified cerebral artery: Secondary | ICD-10-CM | POA: Diagnosis present

## 2015-04-07 DIAGNOSIS — Z79899 Other long term (current) drug therapy: Secondary | ICD-10-CM | POA: Diagnosis not present

## 2015-04-07 DIAGNOSIS — R112 Nausea with vomiting, unspecified: Secondary | ICD-10-CM | POA: Diagnosis not present

## 2015-04-07 DIAGNOSIS — I6349 Cerebral infarction due to embolism of other cerebral artery: Secondary | ICD-10-CM

## 2015-04-07 DIAGNOSIS — I6789 Other cerebrovascular disease: Secondary | ICD-10-CM

## 2015-04-07 DIAGNOSIS — I63119 Cerebral infarction due to embolism of unspecified vertebral artery: Secondary | ICD-10-CM | POA: Diagnosis not present

## 2015-04-07 DIAGNOSIS — I872 Venous insufficiency (chronic) (peripheral): Secondary | ICD-10-CM | POA: Diagnosis present

## 2015-04-07 DIAGNOSIS — I69351 Hemiplegia and hemiparesis following cerebral infarction affecting right dominant side: Secondary | ICD-10-CM | POA: Diagnosis not present

## 2015-04-07 DIAGNOSIS — J4520 Mild intermittent asthma, uncomplicated: Secondary | ICD-10-CM | POA: Diagnosis not present

## 2015-04-07 DIAGNOSIS — I638 Other cerebral infarction: Secondary | ICD-10-CM | POA: Diagnosis not present

## 2015-04-07 DIAGNOSIS — R339 Retention of urine, unspecified: Secondary | ICD-10-CM | POA: Diagnosis not present

## 2015-04-07 DIAGNOSIS — R1110 Vomiting, unspecified: Secondary | ICD-10-CM

## 2015-04-07 DIAGNOSIS — Z8673 Personal history of transient ischemic attack (TIA), and cerebral infarction without residual deficits: Secondary | ICD-10-CM

## 2015-04-07 DIAGNOSIS — R278 Other lack of coordination: Secondary | ICD-10-CM | POA: Diagnosis present

## 2015-04-07 DIAGNOSIS — K5901 Slow transit constipation: Secondary | ICD-10-CM | POA: Diagnosis not present

## 2015-04-07 DIAGNOSIS — R531 Weakness: Secondary | ICD-10-CM | POA: Diagnosis not present

## 2015-04-07 DIAGNOSIS — I6521 Occlusion and stenosis of right carotid artery: Secondary | ICD-10-CM | POA: Diagnosis not present

## 2015-04-07 DIAGNOSIS — Z6832 Body mass index (BMI) 32.0-32.9, adult: Secondary | ICD-10-CM | POA: Diagnosis not present

## 2015-04-07 DIAGNOSIS — I69393 Ataxia following cerebral infarction: Secondary | ICD-10-CM | POA: Diagnosis not present

## 2015-04-07 DIAGNOSIS — R270 Ataxia, unspecified: Secondary | ICD-10-CM | POA: Diagnosis not present

## 2015-04-07 DIAGNOSIS — I69354 Hemiplegia and hemiparesis following cerebral infarction affecting left non-dominant side: Secondary | ICD-10-CM | POA: Diagnosis not present

## 2015-04-07 DIAGNOSIS — Z9849 Cataract extraction status, unspecified eye: Secondary | ICD-10-CM | POA: Diagnosis not present

## 2015-04-07 DIAGNOSIS — Z7902 Long term (current) use of antithrombotics/antiplatelets: Secondary | ICD-10-CM | POA: Diagnosis not present

## 2015-04-07 DIAGNOSIS — Z8249 Family history of ischemic heart disease and other diseases of the circulatory system: Secondary | ICD-10-CM | POA: Diagnosis not present

## 2015-04-07 DIAGNOSIS — I63139 Cerebral infarction due to embolism of unspecified carotid artery: Secondary | ICD-10-CM | POA: Diagnosis not present

## 2015-04-07 DIAGNOSIS — R338 Other retention of urine: Secondary | ICD-10-CM | POA: Diagnosis not present

## 2015-04-07 DIAGNOSIS — J45909 Unspecified asthma, uncomplicated: Secondary | ICD-10-CM | POA: Diagnosis not present

## 2015-04-07 LAB — LIPID PANEL
Cholesterol: 161 mg/dL (ref 0–200)
HDL: 48 mg/dL (ref >40)
LDL CALC: 104 mg/dL — AB (ref 0–99)
TRIGLYCERIDES: 47 mg/dL (ref <150)
Total CHOL/HDL Ratio: 3.4 RATIO
VLDL: 9 mg/dL (ref 0–40)

## 2015-04-07 MED ORDER — PRAVASTATIN SODIUM 20 MG PO TABS
20.0000 mg | ORAL_TABLET | Freq: Every day | ORAL | Status: DC
  Administered 2015-04-07 – 2015-04-09 (×3): 20 mg via ORAL
  Filled 2015-04-07 (×4): qty 1

## 2015-04-07 MED ORDER — IOHEXOL 350 MG/ML SOLN
50.0000 mL | Freq: Once | INTRAVENOUS | Status: AC | PRN
  Administered 2015-04-07: 50 mL via INTRAVENOUS

## 2015-04-07 NOTE — Evaluation (Signed)
Speech Language Pathology Evaluation Patient Details Name: Darlene Murray MRN: [id number] DOB: 04/09/1922 Today's Date: 04/07/2015 Time: 1245-8099 SLP Time Calculation (min) (ACUTE ONLY): 15 min  Problem List:  Patient Active Problem List   Diagnosis Date Noted  . Cerebral thrombosis with cerebral infarction 04/07/2015  . Acute CVA (cerebrovascular accident) ([place]) 04/07/2015  . Weakness 04/06/2015  . Generalized weakness 04/06/2015  . Edema 04/06/2015  . Nausea with vomiting 04/06/2015  . Diastolic dysfunction 83/38/2505  . Severe persistent asthma in adult steroid dependent   . Allergic rhinitis   . Hyperlipidemia   . Venous insufficiency   . HNP (herniated nucleus pulposus), lumbar   . Left ventricular hypertrophy   . Blindness    Past Medical History:  Past Medical History  Diagnosis Date  . Blindness   . Asthmatic bronchitis   . Asthma   . Fibrocystic breast disease   . Allergic rhinitis   . Hyperlipidemia   . Venous insufficiency     lower extremities  . HNP (herniated nucleus pulposus), lumbar     L4-L5  . Left ventricular hypertrophy 2008    mild  . Antibiotic-associated diarrhea    Past Surgical History: History reviewed. No pertinent past surgical history. HPI:  Darlene Murray is a 81 y.o. female with Past medical history of blindness, asthma, dyslipidemia. The patient is presenting with progressive generalized weakness   Assessment / Plan / Recommendation Clinical Impression  Patient assessed for cognition, speech, and language abilities and per this assessment, she is functioning within average to within normal limits. Patient initially appeared with slow cognitive processing, however this was related to her being tired and fatigued from a long day and as clinician proceeded with assessment, she became more alert and responded accurately and efficiently. She did not exhibit any overt s/s or memory impairment, no reasoning, safety awareness,  verbal problem solving impairments, no language or speech impairments.     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, emails, etc) on computer)   Cognition  Overall Cognitive Status: Within Functional Limits for tasks assessed Orientation Level: Oriented X4 Memory: Appears intact Awareness: Appears intact Problem Solving: Appears intact Safety/Judgment: Appears intact    Comprehension  Auditory Comprehension Overall Auditory Comprehension: Appears within functional limits for tasks assessed    Expression Expression Primary Mode of Expression: Verbal Verbal Expression Overall Verbal Expression: Appears within functional limits for tasks assessed Written Expression Dominant Hand: Right   Oral / Motor Oral Motor/Sensory Function Overall Oral Motor/Sensory Function: Appears within functional limits for tasks assessed Motor Speech Overall Motor Speech: Appears within functional limits for tasks assessed   GO     Dannial Monarch 04/07/2015, 2:17 PM   Sonia Baller, MA, CCC-SLP 04/07/2015 2:18 PM

## 2015-04-07 NOTE — Progress Notes (Signed)
STROKE TEAM PROGRESS NOTE  HPI Darlene Murray is a 81 y.o. female who presented yesterday after waking up at 6 AM with nausea and vomiting. She also noticed that she had trouble walking and felt like her voice was "weak." In the emergency room, this was felt to be likely due to her nausea and vomiting a possible gastritis and she was admitted for rehydration. She did have some improvement overnight, however when walking with physical therapy was noted that she was still very unsteady and therefore an MRI of the brain was obtained. This revealed multifocal infarcts and therefore neurology has been consulted.   LKW: 9/28 prior to bed tpa given?: no, out of window      SUBJECTIVE (INTERVAL HISTORY) The patient's husband and son are at the bedside. The patient appears very young for her stated age and apparently has been very active prior to admission. Dr. Leonie Man discussed his suspicions that the patient suffered embolic strokes secondary to atrial fibrillation; however, we will need further evidence prior to considering anticoagulation. Also will need physical therapy to evaluate patient's ambulation status if anticoagulation is to be considered. Dr. Leonie Man reviewed the images from the MRI with the patient and her family. We will plan CT angiogram for further evaluation. May not need a TEE or loop. Thirty day event monitor to be scheduled. All questions were answered to the patient's and family's satisfaction.   OBJECTIVE Temp:  [98 F (36.7 C)-99.4 F (37.4 C)] 98.7 F (37.1 C) (10/01 0601) Pulse Rate:  [67-91] 80 (10/01 0601) Cardiac Rhythm:  [-] Heart block;Bundle branch block (09/30 1929) Resp:  [16-18] 16 (10/01 0601) BP: (134-175)/(51-74) 150/62 mmHg (10/01 0601) SpO2:  [98 %-100 %] 100 % (10/01 0601)  CBC:  Recent Labs Lab 04/05/15 2252 04/06/15 0520  WBC 8.2 8.1  NEUTROABS 5.5 5.0  HGB 14.2 13.5  HCT 43.0 41.3  MCV 89.0 88.4  PLT 244 163    Basic Metabolic Panel:   Recent Labs Lab 04/05/15 2252 04/06/15 0520  NA 136 136  K 3.8 3.8  CL 103 103  CO2 25 25  GLUCOSE 113* 103*  BUN 13 11  CREATININE 1.00 0.96  CALCIUM 8.9 8.6*    Lipid Panel:    Component Value Date/Time   CHOL  11/19/2008 0402    151        ATP III CLASSIFICATION:  <200     mg/dL   Desirable  200-239  mg/dL   Borderline High  >=240    mg/dL   High          TRIG 71 11/19/2008 0402   HDL 42 11/19/2008 0402   CHOLHDL 3.6 11/19/2008 0402   VLDL 14 11/19/2008 0402   LDLCALC  11/19/2008 0402    95        Total Cholesterol/HDL:CHD Risk Coronary Heart Disease Risk Table                     Men   Women  1/2 Average Risk   3.4   3.3  Average Risk       5.0   4.4  2 X Average Risk   9.6   7.1  3 X Average Risk  23.4   11.0        Use the calculated Patient Ratio above and the CHD Risk Table to determine the patient's CHD Risk.        ATP III CLASSIFICATION (LDL):  <100  mg/dL   Optimal  100-129  mg/dL   Near or Above                    Optimal  130-159  mg/dL   Borderline  160-189  mg/dL   High  >190     mg/dL   Very High   HgbA1c:  Lab Results  Component Value Date   HGBA1C  11/19/2008    5.6 (NOTE) The ADA recommends the following therapeutic goal for glycemic control related to Hgb A1c measurement: Goal of therapy: <6.5 Hgb A1c  Reference: American Diabetes Association: Clinical Practice Recommendations 2010, Diabetes Care, 2010, 33: (Suppl  1).   Urine Drug Screen: No results found for: LABOPIA, COCAINSCRNUR, LABBENZ, AMPHETMU, THCU, LABBARB    IMAGING   Ct Head Wo Contrast 04/05/2015    1.  No acute intracranial abnormality.  Stable atrophy.  2. Paranasal sinus inflammatory change, progressed from prior.      Mr Brain Wo Contrast 04/06/2015    Left hippocampal restricted motion with hemorrhage hemorrhagic infarct rather than herpes encephalitis given the surrounding infarcts as noted below. Clinical correlation recommended.  Acute moderate size  nonhemorrhagic infarct posterior right upper and mid pons and adjacent right cerebellum.  Acute tiny nonhemorrhagic left thalamic infarct.  Tiny acute nonhemorrhagic left occipital lobe infarct.  Left vertebral artery may be occluded. Narrowed irregular right vertebral artery and basilar artery. Internal carotid arteries appear patent. Complex opacification right sphenoid sinus and aerated aspect of the upper right pterygoid plate.      Dg Chest Port 1 View 04/05/2015    No active cardiopulmonary disease.      CT angiogram of the head and neck - pending    PHYSICAL EXAM Pleasant frail elderly African-American lady currently not in distress.. . Afebrile. Head is nontraumatic. Neck is supple without bruit.    Cardiac exam no murmur or gallop. Lungs are clear to auscultation. Distal pulses are well felt. Neurological Exam :  Awake alert oriented to time place and person. Slightly diminished attention and recall. Follows commands well. No dysarthria or aphasia. Extraocular moments are full range except for restriction of vertical gaze. Pupils irregular but reactive. Vision acuity seems adequate. Blinks to threat bilaterally. No facial weakness. Tongue midline. Good cough and gag. Motor system exam reveals no upper or lower eczema to drift. No focal weakness. Mild diminished finger-to-nose coordination in the right. Deep tendon reflexes are 2+ symmetric. Sensation is intact bilaterally. Plantars are downgoing. Gait was not tested.    ASSESSMENT/PLAN [name] is a 81 y.o. female with history of blindness, asthma, hyperlipidemia, and low back problems,  presenting with unstable gait, with nausea and vomiting.. She did not receive IV t-PA due to late presentation.   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 Suspect atrial fibrillation.  Resultant  mild right-sided dysmetria and restricted upgaze  MRI  multiple infarcts as  noted above.  MRA  not performed  CT angiogram of the head and neck - pending  Carotid Doppler refer to the CT angiogram of the head and neck  2D Echo - pending  LDL - 104  HgbA1c pending  VTE prophylaxis - SCDs  Diet regular Room service appropriate?: Yes; Fluid consistency:: Thin  no antithrombotic prior to admission, now on aspirin 325 mg orally every day  Patient counseled to be compliant with her antithrombotic medications  Ongoing aggressive stroke risk factor management  Therapy recommendations: SNF recommended  by physical therapy  Disposition:  Pending  Hypertension  Stable - mildly high at times  Permissive hypertension (OK if < 220/120) but gradually normalize in 5-7 days  Hyperlipidemia  Home meds: No lipid lowering medications prior to admission.  LDL 104, goal < 70  Add Pravachol 20 mg daily  Continue statin at discharge   Other Stroke Risk Factors  Advanced age  ETOH use  Obesity, Body mass index is 32.4 kg/(m^2).    Other Active Problems  Prednisone 5 mg daily - suspect this is for patient's asthma history.  30 day outpatient event monitor planned  Hospital day #   Mikey Bussing PA-C [facility] Pager [phone] 04/07/2015, 12:16 PM I have personally examined this patient, reviewed notes, independently viewed imaging studies, participated in medical decision making and plan of care. I have made any additions or clarifications directly to the above note. Agree with note above. 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 She is at significant risk for neurological worsening, recurrent stroke, TIA needs ongoing stroke evaluation and aggressive risk factor management. Had a long discussion with the patient's husband and son  at the bedside and answered questions. Continue aspirin for now. Strong clinical suspicion for atrial fibrillation and she will need workup for that. I had a long discussion with the patient's, husband and son at the bedside and answered questions. Discussed with Dr. Wynelle Cleveland.  Antony Contras, MD Medical Director [facility] Pager: [phone] 04/07/2015 2:15 PM     To contact Stroke Continuity provider, please refer to [email or web address] After hours, contact General Neurology

## 2015-04-07 NOTE — Progress Notes (Addendum)
[facility] Progress Note   Early Ord  BUL:[id number]  DOB: 14-Nov-1921  DOA: 04/05/2015 PCP: Gennette Pac, MD  Brief narrative: Darlene Murray is a 81 y.o. female with asthma dyslipidemia who is blind admitted to the hospital with nausea vomiting and generalized weakness. Symptoms of nausea and vomiting improved but she continued to have trouble with an unsteady gait and therefore an MRI of the brain was ordered. This revealed multifocal infarcts.   Subjective: The patient states that she's weak overall. No complaints of focal numbness or weakness. No further nausea or vomiting. No abdominal pain.  Assessment/Plan: Principal Problem:   Cerebrovascular accident (CVA) due to embolism ([place]) -Ischemic infarcts noted in right superior cerebellar, right pontine left hippocampal with petechial hemorrhage - Continue aspirin and Pravachol -Stroke team assisting with management - Due to embolic nature of infarcts, she'll most likely need to have an event monitor-neurology feels that she may be too high risk for a TEE -Follow up on transthoracic echo  CTA head and neck- 52 % stenosis in R carotid bifurcation with plaque - PT eval - recommending SNF - ECHO pending - A1c- pending - LDL 104- Pravachol started  Active Problems:    Nausea with vomiting - resolved    Severe persistent asthma in adult steroid dependent - cont Low dose Prednisone, Dulera, nebs    Generalized weakness - SNF per PT eval    Code Status:     Code Status Orders        Start     Ordered   04/06/15 1649  Full code   Continuous     04/06/15 1649     Family Communication: husband and son at bedside Disposition Plan: SNF DVT prophylaxis: SCD Consultants:neuro Procedures:  Antibiotics: Anti-infectives    None      Objective: Filed Weights   04/05/15 2159 04/06/15 0449  Weight: 78.472 kg (173 lb) 80.377 kg (177 lb 3.2 oz)    Intake/Output Summary (Last 24 hours)  at 04/07/15 1624 Last data filed at 04/07/15 0931  Gross per 24 hour  Intake      0 ml  Output      2 ml  Net     -2 ml     Vitals Filed Vitals:   04/07/15 0601 04/07/15 0900 04/07/15 0924 04/07/15 1331  BP: 150/62  138/65 128/55  Pulse: 80 80 70 79  Temp: 98.7 F (37.1 C)  98.1 F (36.7 C) 98.2 F (36.8 C)  TempSrc: Oral  Oral Oral  Resp: 16 16 16 16   Height:      Weight:      SpO2: 100% 100% 98% 99%    Exam:  General:  Pt is alert, not in acute distress  HEENT: No icterus, No thrush, oral mucosa moist  Cardiovascular: regular rate and rhythm, S1/S2 No murmur  Respiratory: clear to auscultation bilaterally   Abdomen: Soft, +Bowel sounds, non tender, non distended, no guarding  MSK: No LE edema, cyanosis or clubbing  Data Reviewed: Basic Metabolic Panel:  Recent Labs Lab 04/05/15 2252 04/06/15 0520  NA 136 136  K 3.8 3.8  CL 103 103  CO2 25 25  GLUCOSE 113* 103*  BUN 13 11  CREATININE 1.00 0.96  CALCIUM 8.9 8.6*   Liver Function Tests:  Recent Labs Lab 04/05/15 2252 04/06/15 0520  AST 30 29  ALT 17 16  ALKPHOS 55 51  BILITOT 1.3* 1.5*  PROT 6.2* 5.7*  ALBUMIN 3.3* 3.1*  Recent Labs Lab 04/05/15 2252  LIPASE 23   No results for input(s): AMMONIA in the last 168 hours. CBC:  Recent Labs Lab 04/05/15 2252 04/06/15 0520  WBC 8.2 8.1  NEUTROABS 5.5 5.0  HGB 14.2 13.5  HCT 43.0 41.3  MCV 89.0 88.4  PLT 244 237   Cardiac Enzymes: No results for input(s): CKTOTAL, CKMB, CKMBINDEX, TROPONINI in the last 168 hours. BNP (last 3 results)  Recent Labs  12/16/14 1025  BNP 14.1    ProBNP (last 3 results) No results for input(s): PROBNP in the last 8760 hours.  CBG: No results for input(s): GLUCAP in the last 168 hours.  No results found for this or any previous visit (from the past 240 hour(s)).   Studies: Ct Angio Head W/cm &/or Wo Cm  04/07/2015   CLINICAL DATA:  80 year old female with acute intracranial infarcts.  Presented with nausea, vomiting and dizziness. Subsequent encounter.  EXAM: CT ANGIOGRAPHY HEAD AND NECK  TECHNIQUE: Multidetector CT imaging of the head and neck was performed using the standard protocol during bolus administration of intravenous contrast. Multiplanar CT image reconstructions and MIPs were obtained to evaluate the vascular anatomy. Carotid stenosis measurements (when applicable) are obtained utilizing NASCET criteria, using the distal internal carotid diameter as the denominator.  CONTRAST:  24mL OMNIPAQUE IOHEXOL 350 MG/ML SOLN  COMPARISON:  04/06/2015 brain MR.  FINDINGS: CT HEAD  Brain: Left thalamic, left hippocampus and right pontine/cerebellar infarct. No intracranial hemorrhage. Global atrophy without hydrocephalus. No intracranial enhancing lesion.  Calvarium and skull base: Negative.  Paranasal sinuses: Opacification sphenoid sinuses greater on the right.  Orbits: Post lens replacement.  CTA NECK  Aortic arch: Ectatic 3 vessel aortic arch with plaque.  Right carotid system: Ectatic common carotid artery. Plaque right carotid bifurcation with 52% diameter stenosis proximal right internal carotid artery. Ectatic cervical segment.  Left carotid system: Ectatic common carotid artery. No significant stenosis left carotid bifurcation or proximal left internal carotid artery. Ectatic cervical segment.  Vertebral arteries:Plaque with mild narrowing proximal right vertebral artery. Mild narrowing proximal left vertebral artery. Moderate narrowing left vertebral artery C3-4 level.  Skeleton: Cervical spondylotic changes most notable C4-5 thru C5-6.  Other neck: No worrisome primary neck mass or lung apical lesion.  CTA HEAD  Anterior circulation: Internal carotid artery cavernous segment calcification with mild narrowing bilaterally. Anterior circulation without medium or large size vessel significant stenosis or occlusion.  Posterior circulation: Right vertebral artery is dominant and mildly  ectatic. Narrowing distal left vertebral artery. Ectatic basilar artery with mild narrowing but without high-grade stenosis. No significant stenosis of the proximal posterior cerebral arteries. Fetal type contribution on right. Narrowing distal aspect of the posterior cerebral arteries bilaterally.  Venous sinuses: Negative  Anatomic variants: Negative  Delayed phase: Negative  IMPRESSION: CT HEAD  Left thalamic, left hippocampus and right pontine/cerebellar infarct.  Opacification sphenoid sinuses greater on the right.  CTA NECK  Ectatic 3 vessel aortic arch with plaque.  Plaque right carotid bifurcation with 52% diameter stenosis proximal right internal carotid artery.  No significant stenosis left carotid bifurcation or proximal left internal carotid artery.  Plaque with mild narrowing proximal right vertebral artery.  Mild narrowing proximal left vertebral artery. Moderate narrowing left vertebral artery C3-4 level.  CTA HEAD  Internal carotid artery cavernous segment calcification with mild narrowing bilaterally.  Anterior circulation without medium or large size vessel significant stenosis or occlusion.  Right vertebral artery is dominant and mildly ectatic.  Narrowing distal left vertebral artery.  Ectatic basilar artery with mild narrowing but without high-grade stenosis.  No significant stenosis of the proximal posterior cerebral arteries. Fetal type contribution on right. Narrowing distal aspect of the posterior cerebral arteries bilaterally.   Electronically Signed   By: Genia Del M.D.   On: 04/07/2015 15:36   Ct Head Wo Contrast  04/05/2015   CLINICAL DATA:  Lethargy, vomiting and nausea for 12 hr.  EXAM: CT HEAD WITHOUT CONTRAST  TECHNIQUE: Contiguous axial images were obtained from the base of the skull through the vertex without intravenous contrast.  COMPARISON:  Head CT 06/28/2014  FINDINGS: No intracranial hemorrhage, mass effect, or midline shift. No hydrocephalus. The basilar cisterns are  patent. No evidence of territorial infarct. No intracranial fluid collection. Generalized atrophy, stable in degree from prior exam. Calvarium is intact. Chronic opacification of right side of sphenoid sinus with minimal retained secretions and left sphenoid sinus. Mucosal thickening throughout the ethmoid air cells, mildly progressed. Mastoid air cells are well aerated.  IMPRESSION: 1.  No acute intracranial abnormality.  Stable atrophy. 2. Paranasal sinus inflammatory change, progressed from prior.   Electronically Signed   By: Jeb Levering M.D.   On: 04/05/2015 22:46   Ct Angio Neck W/cm &/or Wo/cm  04/07/2015   CLINICAL DATA:  81 year old female with acute intracranial infarcts. Presented with nausea, vomiting and dizziness. Subsequent encounter.  EXAM: CT ANGIOGRAPHY HEAD AND NECK  TECHNIQUE: Multidetector CT imaging of the head and neck was performed using the standard protocol during bolus administration of intravenous contrast. Multiplanar CT image reconstructions and MIPs were obtained to evaluate the vascular anatomy. Carotid stenosis measurements (when applicable) are obtained utilizing NASCET criteria, using the distal internal carotid diameter as the denominator.  CONTRAST:  14mL OMNIPAQUE IOHEXOL 350 MG/ML SOLN  COMPARISON:  04/06/2015 brain MR.  FINDINGS: CT HEAD  Brain: Left thalamic, left hippocampus and right pontine/cerebellar infarct. No intracranial hemorrhage. Global atrophy without hydrocephalus. No intracranial enhancing lesion.  Calvarium and skull base: Negative.  Paranasal sinuses: Opacification sphenoid sinuses greater on the right.  Orbits: Post lens replacement.  CTA NECK  Aortic arch: Ectatic 3 vessel aortic arch with plaque.  Right carotid system: Ectatic common carotid artery. Plaque right carotid bifurcation with 52% diameter stenosis proximal right internal carotid artery. Ectatic cervical segment.  Left carotid system: Ectatic common carotid artery. No significant stenosis  left carotid bifurcation or proximal left internal carotid artery. Ectatic cervical segment.  Vertebral arteries:Plaque with mild narrowing proximal right vertebral artery. Mild narrowing proximal left vertebral artery. Moderate narrowing left vertebral artery C3-4 level.  Skeleton: Cervical spondylotic changes most notable C4-5 thru C5-6.  Other neck: No worrisome primary neck mass or lung apical lesion.  CTA HEAD  Anterior circulation: Internal carotid artery cavernous segment calcification with mild narrowing bilaterally. Anterior circulation without medium or large size vessel significant stenosis or occlusion.  Posterior circulation: Right vertebral artery is dominant and mildly ectatic. Narrowing distal left vertebral artery. Ectatic basilar artery with mild narrowing but without high-grade stenosis. No significant stenosis of the proximal posterior cerebral arteries. Fetal type contribution on right. Narrowing distal aspect of the posterior cerebral arteries bilaterally.  Venous sinuses: Negative  Anatomic variants: Negative  Delayed phase: Negative  IMPRESSION: CT HEAD  Left thalamic, left hippocampus and right pontine/cerebellar infarct.  Opacification sphenoid sinuses greater on the right.  CTA NECK  Ectatic 3 vessel aortic arch with plaque.  Plaque right carotid bifurcation with 52% diameter stenosis proximal right internal carotid artery.  No significant  stenosis left carotid bifurcation or proximal left internal carotid artery.  Plaque with mild narrowing proximal right vertebral artery.  Mild narrowing proximal left vertebral artery. Moderate narrowing left vertebral artery C3-4 level.  CTA HEAD  Internal carotid artery cavernous segment calcification with mild narrowing bilaterally.  Anterior circulation without medium or large size vessel significant stenosis or occlusion.  Right vertebral artery is dominant and mildly ectatic.  Narrowing distal left vertebral artery.  Ectatic basilar artery with  mild narrowing but without high-grade stenosis.  No significant stenosis of the proximal posterior cerebral arteries. Fetal type contribution on right. Narrowing distal aspect of the posterior cerebral arteries bilaterally.   Electronically Signed   By: Genia Del M.D.   On: 04/07/2015 15:36   Mr Brain Wo Contrast  04/06/2015   CLINICAL DATA:  81 year old female with with nausea, vomiting and some dizziness. Hyperlipidemia. Subsequent encounter.  EXAM: MRI HEAD WITHOUT CONTRAST  TECHNIQUE: Multiplanar, multiecho pulse sequences of the brain and surrounding structures were obtained without intravenous contrast.  COMPARISON:  04/05/2015 head CT.  11/18/2008 MR.  FINDINGS: Exam is motion degraded.  Left hippocampal restricted motion with hemorrhage probably represents hemorrhagic infarct rather than herpes encephalitis given the surrounding infarcts as noted below. Clinical correlation recommended.  Acute moderate size nonhemorrhagic infarct posterior right upper and mid pons and adjacent right cerebellum.  Acute tiny nonhemorrhagic left thalamic infarct.  Tiny acute nonhemorrhagic left occipital lobe infarct.  Global atrophy without hydrocephalus.  No intracranial mass lesion noted on this unenhanced exam.  Left vertebral artery may be occluded. Narrowed irregular right vertebral artery and basilar artery. Internal carotid arteries appear patent.  Complex opacification right sphenoid sinus and aerated aspect of the upper right pterygoid plate.  Mild mucosal thickening remainder of paranasal sinuses.  Elongated globes post lens replacement.  Transverse ligament hypertrophy. Cervical spondylotic changes with mild spinal stenosis C3-4 and C4-5. Cervical medullary junction unremarkable.  Partially empty expanded sella felt to be an incidental finding.  IMPRESSION: Left hippocampal restricted motion with hemorrhage probably represents hemorrhagic infarct rather than herpes encephalitis given the surrounding infarcts  as noted below. Clinical correlation recommended.  Acute moderate size nonhemorrhagic infarct posterior right upper and mid pons and adjacent right cerebellum.  Acute tiny nonhemorrhagic left thalamic infarct.  Tiny acute nonhemorrhagic left occipital lobe infarct.  Left vertebral artery may be occluded. Narrowed irregular right vertebral artery and basilar artery. Internal carotid arteries appear patent.  Complex opacification right sphenoid sinus and aerated aspect of the upper right pterygoid plate.  These results will be called to the ordering clinician or representative by the Radiologist Assistant, and communication documented in the PACS or zVision Dashboard.   Electronically Signed   By: Genia Del M.D.   On: 04/06/2015 16:23   Dg Chest Port 1 View  04/05/2015   CLINICAL DATA:  Shortness of breath today.  EXAM: PORTABLE CHEST 1 VIEW  COMPARISON:  December 16, 2014  FINDINGS: The heart size and mediastinal contours are stable. The aorta is tortuous. The heart size is normal. Both lungs are clear. The visualized skeletal structures are stable.  IMPRESSION: No active cardiopulmonary disease.   Electronically Signed   By: Abelardo Diesel M.D.   On: 04/05/2015 22:19    Scheduled Meds:  Scheduled Meds: . aspirin EC  325 mg Oral Daily  . calcium carbonate  500 mg of elemental calcium Oral Daily  . ipratropium-albuterol  3 mL Nebulization BID  . mometasone-formoterol  2 puff Inhalation BID  .  pravastatin  20 mg Oral q1800  . predniSONE  5 mg Oral Daily   Continuous Infusions:   Time spent on care of this patient: 35 min   [place], MD 04/07/2015, 4:24 PM  LOS: 0 days   [facility] Office  [phone] Pager - Text Page per [email or web address] If 7PM-7AM, please contact night-coverage [email or web address]

## 2015-04-07 NOTE — Evaluation (Signed)
Occupational Therapy Evaluation Patient Details Name: Darlene Murray MRN: [id number] DOB: 1921/11/08 Today's Date: 04/07/2015    History of Present Illness  This 81 y.o. Female admitted with n/v, difficulty walking and "weak" voice.  MRI revealed:   Acute moderate sized nonhemorrhagic infarct posterior Rt upper and mid pons adjacent to the Rt cerebellum, acute tiny nonhemorrhagic  Infarct Lt thalmus, tinue acute, nonhemorrhagic infarct Lt occipital lobe.    PMH includes:  asthmatic bronchitis, venous insufficiency, lumbar HNP,    Clinical Impression   Pt admitted with above. She demonstrates the below listed deficits and will benefit from continued OT to maximize safety and independence with BADLs.  Pt presents to OT with generalized weakness, ataxia/dysmetria, impaired balance.  Currently, she requires mod - max A for ADLs and mod A for functional mobility.  Spouse is very supportive and able to provide 24 hour assistance. Feel she would benefit from CIR.       Follow Up Recommendations  CIR;Supervision/Assistance - 24 hour    Equipment Recommendations  3 in 1 bedside comode;Tub/shower seat    Recommendations for Other Services Rehab consult     Precautions / Restrictions Precautions Precautions: None      Mobility Bed Mobility Overal bed mobility: Needs Assistance Bed Mobility: Supine to Sit     Supine to sit: Min assist     General bed mobility comments: requires increased time and assist to scoot hips to EOB   Transfers Overall transfer level: Needs assistance Equipment used: 1 person hand held assist Transfers: Sit to/from [facility] Sit to Stand: Min assist Stand pivot transfers: Mod assist       General transfer comment: Pt requires min A to move into standing.  She demonstrates posterior bias, but able to correct with verbal and tactile cues    Balance Overall balance assessment: Needs assistance Sitting-balance support: Feet  supported Sitting balance-Leahy Scale: Fair Sitting balance - Comments: min guard for EOB dynamic balance.  She is able to reach forward to doff socks    Standing balance support: Bilateral upper extremity supported Standing balance-Leahy Scale: Poor Standing balance comment: requires mod A                             ADL Overall ADL's : Needs assistance/impaired Eating/Feeding: Set up;Bed level;Sitting Eating/Feeding Details (indicate cue type and reason): poor apetite Grooming: Wash/dry hands;Wash/dry face;Oral care;Brushing hair;Moderate assistance;Sitting   Upper Body Bathing: Minimal assitance;Sitting   Lower Body Bathing: Maximal assistance;Sit to/from stand   Upper Body Dressing : Moderate assistance;Sitting   Lower Body Dressing: Maximal assistance;Sit to/from stand   Toilet Transfer: Moderate assistance;Stand-pivot;BSC   Toileting- Clothing Manipulation and Hygiene: Maximal assistance;Sit to/from stand       Functional mobility during ADLs: Moderate assistance General ADL Comments: Pt requires assist due to balance deficits and UE dysmetria     Vision Vision Assessment?: Yes Eye Alignment: Impaired (comment) Ocular Range of Motion: Within Functional Limits Tracking/Visual Pursuits: Decreased smoothness of horizontal tracking (to Rt ) Additional Comments: 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  she reports decreased acuity Rt eye.  Mild horizontal nystagmus and oscillopsia with Rt. gaze.  Gaze is dysconjugate - anticipate that is her baseline    Perception Perception Perception Tested?: Yes   Praxis Praxis Praxis tested?: Within functional limits    Pertinent Vitals/Pain Pain Assessment: No/denies pain     Hand Dominance Right   Extremity/Trunk  Assessment Upper Extremity Assessment Upper Extremity Assessment: RUE deficits/detail;LUE deficits/detail RUE Deficits / Details: strength grossly 4/5-4+/5.  Dysmetria noted RUE  Coordination: decreased fine motor;decreased gross motor LUE Deficits / Details: mild dysmetria noted    Lower Extremity Assessment Lower Extremity Assessment: Defer to PT evaluation   Cervical / Trunk Assessment Cervical / Trunk Assessment: Kyphotic   Communication Communication Communication: No difficulties   Cognition Arousal/Alertness: Awake/alert Behavior During Therapy: Flat affect Overall Cognitive Status: Within Functional Limits for tasks assessed                     General Comments       Exercises       Shoulder Instructions      Home Living Family/patient expects to be discharged to:: Private residence Living Arrangements: Spouse/significant other Available Help at Discharge: Family;Available 24 hours/day Type of Home: House Home Access: Level entry     Home Layout: One level     Bathroom Shower/Tub: Occupational psychologist: Handicapped height     Home Equipment: Environmental consultant - 2 wheels;Cane - single point;Grab bars - tub/shower          Prior Functioning/Environment Level of Independence: Independent             OT Diagnosis: Generalized weakness;Disturbance of vision;Ataxia   OT Problem List: Decreased strength;Decreased activity tolerance;Impaired balance (sitting and/or standing);Impaired vision/perception;Decreased safety awareness;Decreased knowledge of use of DME or AE;Impaired UE functional use   OT Treatment/Interventions: Self-care/ADL training;Neuromuscular education;DME and/or AE instruction;Therapeutic activities;Patient/family education;Balance training;Visual/perceptual remediation/compensation    OT Goals(Current goals can be found in the care plan section) Acute Rehab OT Goals Patient Stated Goal: Go home OT Goal Formulation: With patient/family Time For Goal Achievement: 04/21/15 Potential to Achieve Goals: Good ADL Goals Pt Will Perform Grooming: with min assist;standing Pt Will Perform Upper Body Bathing:  with set-up;sitting Pt Will Perform Lower Body Bathing: with min assist;sit to/from stand Pt Will Perform Upper Body Dressing: with set-up;sitting Pt Will Perform Lower Body Dressing: with min assist;sit to/from stand Pt Will Transfer to Toilet: with min assist;ambulating;regular height toilet;bedside commode;grab bars Pt Will Perform Toileting - Clothing Manipulation and hygiene: with min assist;sit to/from stand  OT Frequency: Min 2X/week   Barriers to D/C:            Co-evaluation              End of Session Nurse Communication: Mobility status  Activity Tolerance: Patient tolerated treatment well Patient left: in chair;with call bell/phone within reach;with chair alarm set;with family/visitor present   Time: 6160-7371 OT Time Calculation (min): 28 min Charges:  OT General Charges $OT Visit: 1 Procedure OT Evaluation $Initial OT Evaluation Tier I: 1 Procedure OT Treatments $Neuromuscular Re-education: 8-22 mins G-Codes:    Airika Alkhatib M 04-13-15, 2:05 PM

## 2015-04-07 NOTE — Progress Notes (Signed)
  Echocardiogram 2D Echocardiogram has been performed.  Jennette Dubin 04/07/2015, 10:25 AM

## 2015-04-07 NOTE — Progress Notes (Signed)
Physical Therapy Treatment Patient Details Name: Darlene Murray MRN: [id number] DOB: [birth year] Today's Date: 04/07/2015    History of Present Illness Trinh Sanjose is a 81 y.o. female with Past medical history of blindness, asthma, dyslipidemia. The patient is presenting with progressive generalized weakness. MRI revealed + CVA.    PT Comments    Good tolerance with therapy today. Still requires min-mod assist with mobility, demonstrating significant ataxia with truncal and LE movements. Ambulated 10 feet with mod assist for balance and walker control. Motivated to regain independence. Husband present and very supportive throughout therapy. Updated recommendations for CIR as she I feel she will tolerate this setting well and benefit most from their services, to progress her safety and independence with mobility.  Follow Up Recommendations  CIR     Equipment Recommendations   (TBD next venue of care)    Recommendations for Other Services Rehab consult     Precautions / Restrictions Precautions Precautions: Fall Restrictions Weight Bearing Restrictions: No    Mobility  Bed Mobility Overal bed mobility: Needs Assistance Bed Mobility: Supine to Sit     Supine to sit: Min assist     General bed mobility comments: Min assist for trucal control, leaning towards the left.  Transfers Overall transfer level: Needs assistance Equipment used: Rolling walker (2 wheeled) Transfers: Sit to/from Stand Sit to Stand: Min assist Stand pivot transfers: Mod assist       General transfer comment: Min assist for boost to stand today, leaning towards the left with ataxic truncal control and difficulty sequencing feet during pivot to chair. Tolerated pre-gait training with weight shifting activity including marching. Frequent cues to correct posture, stood approx 5 minutes before needing to sit. Reports dizziness greatly improved today.  Ambulation/Gait Ambulation/Gait  assistance: Mod assist Ambulation Distance (Feet): 10 Feet Assistive device: Rolling walker (2 wheeled) Gait Pattern/deviations: Step-through pattern;Decreased stride length;Ataxic;Wide base of support;Trunk flexed;Staggering left Gait velocity: slow Gait velocity interpretation: <1.8 ft/sec, indicative of risk for recurrent falls General Gait Details: Mod assist for balance and walker control especially with turns. Drifts left and requires cues for correction with facilitory assist at times. No buckling although very ataxic foot placement.   Stairs            Wheelchair Mobility    Modified Rankin (Stroke Patients Only) Modified Rankin (Stroke Patients Only) Pre-Morbid Rankin Score: No significant disability Modified Rankin: Moderately severe disability     Balance                                    Cognition Arousal/Alertness: Awake/alert Behavior During Therapy: Flat affect Overall Cognitive Status: Within Functional Limits for tasks assessed                      Exercises General Exercises - Lower Extremity Ankle Circles/Pumps: AROM;Both;10 reps;Supine    General Comments General comments (skin integrity, edema, etc.): No dizziness today upon standing. Had an episode of urinary incontinence however.       Pertinent Vitals/Pain Pain Assessment: No/denies pain    Home Living                      Prior Function            PT Goals (current goals can now be found in the care plan section) Acute Rehab PT Goals Patient Stated Goal: Go home PT  Goal Formulation: With patient Time For Goal Achievement: 04/20/15 Potential to Achieve Goals: Good Progress towards PT goals: Progressing toward goals    Frequency  Min 4X/week    PT Plan Discharge plan needs to be updated    Co-evaluation             End of Session Equipment Utilized During Treatment: Gait belt Activity Tolerance: Patient tolerated treatment well Patient  left: with call bell/phone within reach;with chair alarm set;in chair;with family/visitor present     Time: 7408-1448 PT Time Calculation (min) (ACUTE ONLY): 29 min  Charges:  $Therapeutic Activity: 23-37 mins                    G CodesEllouise Newer 05/06/2015, 6:46 PM Camille Bal [place], [place]

## 2015-04-08 DIAGNOSIS — I63119 Cerebral infarction due to embolism of unspecified vertebral artery: Secondary | ICD-10-CM

## 2015-04-08 DIAGNOSIS — R270 Ataxia, unspecified: Secondary | ICD-10-CM | POA: Insufficient documentation

## 2015-04-08 MED ORDER — ASPIRIN 325 MG PO TBEC
325.0000 mg | DELAYED_RELEASE_TABLET | Freq: Every day | ORAL | Status: DC
Start: 2015-04-08 — End: 2015-05-23

## 2015-04-08 MED ORDER — PRAVASTATIN SODIUM 20 MG PO TABS: 20.0000 mg | ORAL_TABLET | Freq: Every day | ORAL | Status: DC

## 2015-04-08 NOTE — Discharge Summary (Addendum)
Physician Discharge Summary  Darlene Murray EXB:[id number] DOB: 1921/11/24 DOA: 04/05/2015  PCP: Gennette Pac, MD  Admit date: 04/05/2015 Discharge date: 04/09/2015  Time spent: 60 minutes  Recommendations for Outpatient Follow-up:  1. F/u A1c 2. outpt surveillance of Carotid stenosis 3. Event monitor 4. F/u with Neurology in 2 months  Discharge Condition: stable    Discharge Diagnoses:  Principal Problem:   Cerebrovascular accident (CVA) due to embolism ([place]) Active Problems:   Severe persistent asthma in adult steroid dependent   Generalized weakness   Nausea with vomiting   Ataxia   History of present illness:  Darlene Murray is a 81 y.o. female with asthma dyslipidemia who is blind admitted to the hospital with nausea vomiting and generalized weakness. Symptoms of nausea and vomiting improved but she continued to have trouble with an unsteady gait and therefore an MRI of the brain was ordered. This revealed multifocal infarcts.   Hospital Course:  Principal Problem:  Cerebrovascular accident due to embolism  -Ischemic infarcts noted in right superior cerebellar, right pontine, left hippocampal areas with petechial hemorrhage -Stroke team assisting with management and suspecting this is an embolic infarct from A-fib - Due to possible embolic nature of infarcts, she'll need to have an event monitor which I have arranged - neurology feels that she is too a high risk for a TEE- at this time Neurology recommends an Aspirin only for further stroke prophylaxis- have started a full dose aspirin   -transthoracic echo does not reveal a thrombus- see report below - CTA head and neck- 52 % stenosis in R carotid bifurcation with plaque- outpt surveillance  - PT eval - recommending CIR - A1c- pending - LDL 104- Pravachol started  Active Problems:   Nausea with vomiting - resolved- gastroenteritis?   Severe persistent asthma in adult steroid dependent - cont  Low dose Prednisone, Dulera, nebs   Generalized weakness - PT eval- CIR recommended   Procedures: ECHO Left ventricle: The cavity size was normal. Systolic function was normal. The estimated ejection fraction was in the range of 60% to 65%. Wall motion was normal; there were no regional wall motion abnormalities. There was an increased relative contribution of atrial contraction to ventricular filling. Doppler parameters are consistent with abnormal left ventricular relaxation (grade 1 diastolic dysfunction). - Aortic valve: There was mild regurgitation. - Mitral valve: There was trivial regurgitation.   Consultations:  Neurology  Discharge Exam: [facility] Weights   04/05/15 2159 04/06/15 0449  Weight: 78.472 kg (173 lb) 80.377 kg (177 lb 3.2 oz)   Filed Vitals:   04/09/15 0506  BP: 115/57  Pulse: 73  Temp: 98.6 F (37 C)  Resp: 18    General: AAO x 3, no distress Cardiovascular: RRR, no murmurs  Respiratory: clear to auscultation bilaterally GI: soft, non-tender, non-distended, bowel sound positive  Discharge Instructions You were cared for by a hospitalist during your hospital stay. If you have any questions about your discharge medications or the care you received while you were in the hospital after you are discharged, you can call the unit and asked to speak with the hospitalist on call if the hospitalist that took care of you is not available. Once you are discharged, your primary care physician will handle any further medical issues. Please note that NO REFILLS for any discharge medications will be authorized once you are discharged, as it is imperative that you return to your primary care physician (or establish a relationship with a primary care physician if  you do not have one) for your aftercare needs so that they can reassess your need for medications and monitor your lab values.     Medication List    TAKE these medications        albuterol  108 (90 BASE) MCG/ACT inhaler  Commonly known as:  PROVENTIL HFA;VENTOLIN HFA  Inhale 2 puffs into the lungs every 6 (six) hours as needed for wheezing or shortness of breath.     aspirin 325 MG EC tablet  Take 1 tablet (325 mg total) by mouth daily.     calcium carbonate 600 MG Tabs tablet  Commonly known as:  OS-CAL  Take 600 mg by mouth daily.     Fluticasone Furoate-Vilanterol 200-25 MCG/INH Aepb  Commonly known as:  BREO ELLIPTA  Inhale 1 puff into the lungs daily.     multivitamin tablet  Take 1 tablet by mouth daily.     ondansetron 4 MG disintegrating tablet  Commonly known as:  ZOFRAN ODT  Take 1 tablet (4 mg total) by mouth every 8 (eight) hours as needed for nausea or vomiting.     pravastatin 20 MG tablet  Commonly known as:  PRAVACHOL  Take 1 tablet (20 mg total) by mouth daily at 6 PM.     predniSONE 5 MG tablet  Commonly known as:  DELTASONE  Take 1 tablet (5 mg total) by mouth daily.     traZODone 50 MG tablet  Commonly known as:  DESYREL  Take 50 mg by mouth at bedtime as needed.     VITAMIN B-6 PO  Take 1 tablet by mouth daily.       No Known Allergies Follow-up Information    Follow up with Gennette Pac, MD.   Specialty:  Family Medicine   Contact information:   [place] [address] [phone]       Follow up with Gennette Pac, MD.   Specialty:  [facility]   Contact information:   [place] [address] [phone]       Follow up with Gennette Pac, MD On 04/18/2015.   Specialty:  Family Medicine   Why:  [place] scheduled appt. 04/18/15  @1 :30pm   Contact information:   [place] [address] [phone]        The results of significant diagnostics from this hospitalization (including imaging, microbiology, ancillary and laboratory) are listed below for reference.    Significant Diagnostic Studies: Ct Angio Head W/cm &/or Wo Cm  04/07/2015    CLINICAL DATA:  81 year old female with acute intracranial infarcts. Presented with nausea, vomiting and dizziness. Subsequent encounter.  EXAM: CT ANGIOGRAPHY HEAD AND NECK  TECHNIQUE: Multidetector CT imaging of the head and neck was performed using the standard protocol during bolus administration of intravenous contrast. Multiplanar CT image reconstructions and MIPs were obtained to evaluate the vascular anatomy. Carotid stenosis measurements (when applicable) are obtained utilizing NASCET criteria, using the distal internal carotid diameter as the denominator.  CONTRAST:  20mL OMNIPAQUE IOHEXOL 350 MG/ML SOLN  COMPARISON:  04/06/2015 brain MR.  FINDINGS: CT HEAD  Brain: Left thalamic, left hippocampus and right pontine/cerebellar infarct. No intracranial hemorrhage. Global atrophy without hydrocephalus. No intracranial enhancing lesion.  Calvarium and skull base: Negative.  Paranasal sinuses: Opacification sphenoid sinuses greater on the right.  Orbits: Post lens replacement.  CTA NECK  Aortic arch: Ectatic 3 vessel aortic arch with plaque.  Right carotid system: Ectatic common carotid artery. Plaque  right carotid bifurcation with 52% diameter stenosis proximal right internal carotid artery. Ectatic cervical segment.  Left carotid system: Ectatic common carotid artery. No significant stenosis left carotid bifurcation or proximal left internal carotid artery. Ectatic cervical segment.  Vertebral arteries:Plaque with mild narrowing proximal right vertebral artery. Mild narrowing proximal left vertebral artery. Moderate narrowing left vertebral artery C3-4 level.  Skeleton: Cervical spondylotic changes most notable C4-5 thru C5-6.  Other neck: No worrisome primary neck mass or lung apical lesion.  CTA HEAD  Anterior circulation: Internal carotid artery cavernous segment calcification with mild narrowing bilaterally. Anterior circulation without medium or large size vessel significant stenosis or occlusion.   Posterior circulation: Right vertebral artery is dominant and mildly ectatic. Narrowing distal left vertebral artery. Ectatic basilar artery with mild narrowing but without high-grade stenosis. No significant stenosis of the proximal posterior cerebral arteries. Fetal type contribution on right. Narrowing distal aspect of the posterior cerebral arteries bilaterally.  Venous sinuses: Negative  Anatomic variants: Negative  Delayed phase: Negative  IMPRESSION: CT HEAD  Left thalamic, left hippocampus and right pontine/cerebellar infarct.  Opacification sphenoid sinuses greater on the right.  CTA NECK  Ectatic 3 vessel aortic arch with plaque.  Plaque right carotid bifurcation with 52% diameter stenosis proximal right internal carotid artery.  No significant stenosis left carotid bifurcation or proximal left internal carotid artery.  Plaque with mild narrowing proximal right vertebral artery.  Mild narrowing proximal left vertebral artery. Moderate narrowing left vertebral artery C3-4 level.  CTA HEAD  Internal carotid artery cavernous segment calcification with mild narrowing bilaterally.  Anterior circulation without medium or large size vessel significant stenosis or occlusion.  Right vertebral artery is dominant and mildly ectatic.  Narrowing distal left vertebral artery.  Ectatic basilar artery with mild narrowing but without high-grade stenosis.  No significant stenosis of the proximal posterior cerebral arteries. Fetal type contribution on right. Narrowing distal aspect of the posterior cerebral arteries bilaterally.   Electronically Signed   By: Genia Del M.D.   On: 04/07/2015 15:36   Ct Head Wo Contrast  04/05/2015   CLINICAL DATA:  Lethargy, vomiting and nausea for 12 hr.  EXAM: CT HEAD WITHOUT CONTRAST  TECHNIQUE: Contiguous axial images were obtained from the base of the skull through the vertex without intravenous contrast.  COMPARISON:  Head CT 06/28/2014  FINDINGS: No intracranial hemorrhage, mass  effect, or midline shift. No hydrocephalus. The basilar cisterns are patent. No evidence of territorial infarct. No intracranial fluid collection. Generalized atrophy, stable in degree from prior exam. Calvarium is intact. Chronic opacification of right side of sphenoid sinus with minimal retained secretions and left sphenoid sinus. Mucosal thickening throughout the ethmoid air cells, mildly progressed. Mastoid air cells are well aerated.  IMPRESSION: 1.  No acute intracranial abnormality.  Stable atrophy. 2. Paranasal sinus inflammatory change, progressed from prior.   Electronically Signed   By: Jeb Levering M.D.   On: 04/05/2015 22:46   Ct Angio Neck W/cm &/or Wo/cm  04/07/2015   CLINICAL DATA:  81 year old female with acute intracranial infarcts. Presented with nausea, vomiting and dizziness. Subsequent encounter.  EXAM: CT ANGIOGRAPHY HEAD AND NECK  TECHNIQUE: Multidetector CT imaging of the head and neck was performed using the standard protocol during bolus administration of intravenous contrast. Multiplanar CT image reconstructions and MIPs were obtained to evaluate the vascular anatomy. Carotid stenosis measurements (when applicable) are obtained utilizing NASCET criteria, using the distal internal carotid diameter as the denominator.  CONTRAST:  24mL OMNIPAQUE IOHEXOL 350  MG/ML SOLN  COMPARISON:  04/06/2015 brain MR.  FINDINGS: CT HEAD  Brain: Left thalamic, left hippocampus and right pontine/cerebellar infarct. No intracranial hemorrhage. Global atrophy without hydrocephalus. No intracranial enhancing lesion.  Calvarium and skull base: Negative.  Paranasal sinuses: Opacification sphenoid sinuses greater on the right.  Orbits: Post lens replacement.  CTA NECK  Aortic arch: Ectatic 3 vessel aortic arch with plaque.  Right carotid system: Ectatic common carotid artery. Plaque right carotid bifurcation with 52% diameter stenosis proximal right internal carotid artery. Ectatic cervical segment.  Left  carotid system: Ectatic common carotid artery. No significant stenosis left carotid bifurcation or proximal left internal carotid artery. Ectatic cervical segment.  Vertebral arteries:Plaque with mild narrowing proximal right vertebral artery. Mild narrowing proximal left vertebral artery. Moderate narrowing left vertebral artery C3-4 level.  Skeleton: Cervical spondylotic changes most notable C4-5 thru C5-6.  Other neck: No worrisome primary neck mass or lung apical lesion.  CTA HEAD  Anterior circulation: Internal carotid artery cavernous segment calcification with mild narrowing bilaterally. Anterior circulation without medium or large size vessel significant stenosis or occlusion.  Posterior circulation: Right vertebral artery is dominant and mildly ectatic. Narrowing distal left vertebral artery. Ectatic basilar artery with mild narrowing but without high-grade stenosis. No significant stenosis of the proximal posterior cerebral arteries. Fetal type contribution on right. Narrowing distal aspect of the posterior cerebral arteries bilaterally.  Venous sinuses: Negative  Anatomic variants: Negative  Delayed phase: Negative  IMPRESSION: CT HEAD  Left thalamic, left hippocampus and right pontine/cerebellar infarct.  Opacification sphenoid sinuses greater on the right.  CTA NECK  Ectatic 3 vessel aortic arch with plaque.  Plaque right carotid bifurcation with 52% diameter stenosis proximal right internal carotid artery.  No significant stenosis left carotid bifurcation or proximal left internal carotid artery.  Plaque with mild narrowing proximal right vertebral artery.  Mild narrowing proximal left vertebral artery. Moderate narrowing left vertebral artery C3-4 level.  CTA HEAD  Internal carotid artery cavernous segment calcification with mild narrowing bilaterally.  Anterior circulation without medium or large size vessel significant stenosis or occlusion.  Right vertebral artery is dominant and mildly ectatic.   Narrowing distal left vertebral artery.  Ectatic basilar artery with mild narrowing but without high-grade stenosis.  No significant stenosis of the proximal posterior cerebral arteries. Fetal type contribution on right. Narrowing distal aspect of the posterior cerebral arteries bilaterally.   Electronically Signed   By: Genia Del M.D.   On: 04/07/2015 15:36   Mr Brain Wo Contrast  04/06/2015   CLINICAL DATA:  81 year old female with with nausea, vomiting and some dizziness. Hyperlipidemia. Subsequent encounter.  EXAM: MRI HEAD WITHOUT CONTRAST  TECHNIQUE: Multiplanar, multiecho pulse sequences of the brain and surrounding structures were obtained without intravenous contrast.  COMPARISON:  04/05/2015 head CT.  11/18/2008 MR.  FINDINGS: Exam is motion degraded.  Left hippocampal restricted motion with hemorrhage probably represents hemorrhagic infarct rather than herpes encephalitis given the surrounding infarcts as noted below. Clinical correlation recommended.  Acute moderate size nonhemorrhagic infarct posterior right upper and mid pons and adjacent right cerebellum.  Acute tiny nonhemorrhagic left thalamic infarct.  Tiny acute nonhemorrhagic left occipital lobe infarct.  Global atrophy without hydrocephalus.  No intracranial mass lesion noted on this unenhanced exam.  Left vertebral artery may be occluded. Narrowed irregular right vertebral artery and basilar artery. Internal carotid arteries appear patent.  Complex opacification right sphenoid sinus and aerated aspect of the upper right pterygoid plate.  Mild mucosal thickening remainder of  paranasal sinuses.  Elongated globes post lens replacement.  Transverse ligament hypertrophy. Cervical spondylotic changes with mild spinal stenosis C3-4 and C4-5. Cervical medullary junction unremarkable.  Partially empty expanded sella felt to be an incidental finding.  IMPRESSION: Left hippocampal restricted motion with hemorrhage probably represents hemorrhagic  infarct rather than herpes encephalitis given the surrounding infarcts as noted below. Clinical correlation recommended.  Acute moderate size nonhemorrhagic infarct posterior right upper and mid pons and adjacent right cerebellum.  Acute tiny nonhemorrhagic left thalamic infarct.  Tiny acute nonhemorrhagic left occipital lobe infarct.  Left vertebral artery may be occluded. Narrowed irregular right vertebral artery and basilar artery. Internal carotid arteries appear patent.  Complex opacification right sphenoid sinus and aerated aspect of the upper right pterygoid plate.  These results will be called to the ordering clinician or representative by the Radiologist Assistant, and communication documented in the PACS or zVision Dashboard.   Electronically Signed   By: Genia Del M.D.   On: 04/06/2015 16:23   Dg Chest Port 1 View  04/05/2015   CLINICAL DATA:  Shortness of breath today.  EXAM: PORTABLE CHEST 1 VIEW  COMPARISON:  December 16, 2014  FINDINGS: The heart size and mediastinal contours are stable. The aorta is tortuous. The heart size is normal. Both lungs are clear. The visualized skeletal structures are stable.  IMPRESSION: No active cardiopulmonary disease.   Electronically Signed   By: Abelardo Diesel M.D.   On: 04/05/2015 22:19    Microbiology: No results found for this or any previous visit (from the past 240 hour(s)).   Labs: Basic Metabolic Panel:  Recent Labs Lab 04/05/15 2252 04/06/15 0520  NA 136 136  K 3.8 3.8  CL 103 103  CO2 25 25  GLUCOSE 113* 103*  BUN 13 11  CREATININE 1.00 0.96  CALCIUM 8.9 8.6*   Liver Function Tests:  Recent Labs Lab 04/05/15 2252 04/06/15 0520  AST 30 29  ALT 17 16  ALKPHOS 55 51  BILITOT 1.3* 1.5*  PROT 6.2* 5.7*  ALBUMIN 3.3* 3.1*    Recent Labs Lab 04/05/15 2252  LIPASE 23   No results for input(s): AMMONIA in the last 168 hours. CBC:  Recent Labs Lab 04/05/15 2252 04/06/15 0520  WBC 8.2 8.1  NEUTROABS 5.5 5.0  HGB 14.2  13.5  HCT 43.0 41.3  MCV 89.0 88.4  PLT 244 237   Cardiac Enzymes: No results for input(s): CKTOTAL, CKMB, CKMBINDEX, TROPONINI in the last 168 hours. BNP: BNP (last 3 results)  Recent Labs  12/16/14 1025  BNP 14.1    ProBNP (last 3 results) No results for input(s): PROBNP in the last 8760 hours.  CBG: No results for input(s): GLUCAP in the last 168 hours.     SignedDebbe Odea, MD [facility] 04/09/2015, 9:24 AM

## 2015-04-08 NOTE — Progress Notes (Signed)
[id number] Ms Rudean Hitt Echo tech brought report to unit for MD to reveiw. Tech unable to enter into Epic. EF 60-65%. Report placed in hard chart per MD request.

## 2015-04-08 NOTE — Progress Notes (Signed)
[facility] Progress Note   Darlene Murray  WPY:[id number]  DOB: 05-03-1922  DOA: 04/05/2015 PCP: Gennette Pac, MD  Brief narrative: Darlene Murray is a 81 y.o. female with asthma dyslipidemia who is blind admitted to the hospital with nausea vomiting and generalized weakness. Symptoms of nausea and vomiting improved but she continued to have trouble with an unsteady gait and therefore an MRI of the brain was ordered. This revealed multifocal infarcts.   Subjective: No new complaints. No nausea, vomiting, diarrhea, dyspnea or cough.   Assessment/Plan: Principal Problem:   Cerebrovascular accident due to embolism   -Ischemic infarcts noted in right superior cerebellar, right pontine, left hippocampal areas with petechial hemorrhage - Continue aspirin and Pravachol -Stroke team assisting with management - Due to possible embolic nature of infarcts, she'll most likely need to have an event monitor - neurology feels that she may be too high risk for a TEE -Follow up on transthoracic echo- report pending - CTA head and neck- 52 % stenosis in R carotid bifurcation with plaque - PT eval - recommending CIR - A1c- pending - LDL 104- Pravachol started  Active Problems:    Nausea with vomiting - resolved    Severe persistent asthma in adult steroid dependent - cont Low dose Prednisone, Dulera, nebs    Generalized weakness -   PT eval- CIR    Code Status:     Code Status Orders        Start     Ordered   04/06/15 1649  Full code   Continuous     04/06/15 1649     Family Communication: husband and son at bedside Disposition Plan: obtain CIR consult DVT prophylaxis: SCD Consultants:neuro Procedures:  Antibiotics: Anti-infectives    None      Objective: Filed Weights   04/05/15 2159 04/06/15 0449  Weight: 78.472 kg (173 lb) 80.377 kg (177 lb 3.2 oz)    Intake/Output Summary (Last 24 hours) at 04/08/15 0844 Last data filed at 04/08/15  0100  Gross per 24 hour  Intake      0 ml  Output      4 ml  Net     -4 ml     Vitals Filed Vitals:   04/07/15 2135 04/08/15 0100 04/08/15 0500 04/08/15 0838  BP:  138/71 120/63   Pulse:  85 74 93  Temp:  98.7 F (37.1 C) 98.2 F (36.8 C)   TempSrc:  Oral Oral   Resp:  16 16 16   Height:      Weight:      SpO2: 95% 95% 97% 93%    Exam:  General:  Pt is alert, not in acute distress  HEENT: No icterus, No thrush, oral mucosa moist  Cardiovascular: regular rate and rhythm, S1/S2 No murmur  Respiratory: clear to auscultation bilaterally   Abdomen: Soft, +Bowel sounds, non tender, non distended, no guarding  MSK: No LE edema, cyanosis or clubbing  Data Reviewed: Basic Metabolic Panel:  Recent Labs Lab 04/05/15 2252 04/06/15 0520  NA 136 136  K 3.8 3.8  CL 103 103  CO2 25 25  GLUCOSE 113* 103*  BUN 13 11  CREATININE 1.00 0.96  CALCIUM 8.9 8.6*   Liver Function Tests:  Recent Labs Lab 04/05/15 2252 04/06/15 0520  AST 30 29  ALT 17 16  ALKPHOS 55 51  BILITOT 1.3* 1.5*  PROT 6.2* 5.7*  ALBUMIN 3.3* 3.1*    Recent Labs Lab 04/05/15 2252  LIPASE 23  No results for input(s): AMMONIA in the last 168 hours. CBC:  Recent Labs Lab 04/05/15 2252 04/06/15 0520  WBC 8.2 8.1  NEUTROABS 5.5 5.0  HGB 14.2 13.5  HCT 43.0 41.3  MCV 89.0 88.4  PLT 244 237   Cardiac Enzymes: No results for input(s): CKTOTAL, CKMB, CKMBINDEX, TROPONINI in the last 168 hours. BNP (last 3 results)  Recent Labs  12/16/14 1025  BNP 14.1    ProBNP (last 3 results) No results for input(s): PROBNP in the last 8760 hours.  CBG: No results for input(s): GLUCAP in the last 168 hours.  No results found for this or any previous visit (from the past 240 hour(s)).   Studies: Ct Angio Head W/cm &/or Wo Cm  04/07/2015   CLINICAL DATA:  81 year old female with acute intracranial infarcts. Presented with nausea, vomiting and dizziness. Subsequent encounter.  EXAM: CT  ANGIOGRAPHY HEAD AND NECK  TECHNIQUE: Multidetector CT imaging of the head and neck was performed using the standard protocol during bolus administration of intravenous contrast. Multiplanar CT image reconstructions and MIPs were obtained to evaluate the vascular anatomy. Carotid stenosis measurements (when applicable) are obtained utilizing NASCET criteria, using the distal internal carotid diameter as the denominator.  CONTRAST:  38mL OMNIPAQUE IOHEXOL 350 MG/ML SOLN  COMPARISON:  04/06/2015 brain MR.  FINDINGS: CT HEAD  Brain: Left thalamic, left hippocampus and right pontine/cerebellar infarct. No intracranial hemorrhage. Global atrophy without hydrocephalus. No intracranial enhancing lesion.  Calvarium and skull base: Negative.  Paranasal sinuses: Opacification sphenoid sinuses greater on the right.  Orbits: Post lens replacement.  CTA NECK  Aortic arch: Ectatic 3 vessel aortic arch with plaque.  Right carotid system: Ectatic common carotid artery. Plaque right carotid bifurcation with 52% diameter stenosis proximal right internal carotid artery. Ectatic cervical segment.  Left carotid system: Ectatic common carotid artery. No significant stenosis left carotid bifurcation or proximal left internal carotid artery. Ectatic cervical segment.  Vertebral arteries:Plaque with mild narrowing proximal right vertebral artery. Mild narrowing proximal left vertebral artery. Moderate narrowing left vertebral artery C3-4 level.  Skeleton: Cervical spondylotic changes most notable C4-5 thru C5-6.  Other neck: No worrisome primary neck mass or lung apical lesion.  CTA HEAD  Anterior circulation: Internal carotid artery cavernous segment calcification with mild narrowing bilaterally. Anterior circulation without medium or large size vessel significant stenosis or occlusion.  Posterior circulation: Right vertebral artery is dominant and mildly ectatic. Narrowing distal left vertebral artery. Ectatic basilar artery with mild  narrowing but without high-grade stenosis. No significant stenosis of the proximal posterior cerebral arteries. Fetal type contribution on right. Narrowing distal aspect of the posterior cerebral arteries bilaterally.  Venous sinuses: Negative  Anatomic variants: Negative  Delayed phase: Negative  IMPRESSION: CT HEAD  Left thalamic, left hippocampus and right pontine/cerebellar infarct.  Opacification sphenoid sinuses greater on the right.  CTA NECK  Ectatic 3 vessel aortic arch with plaque.  Plaque right carotid bifurcation with 52% diameter stenosis proximal right internal carotid artery.  No significant stenosis left carotid bifurcation or proximal left internal carotid artery.  Plaque with mild narrowing proximal right vertebral artery.  Mild narrowing proximal left vertebral artery. Moderate narrowing left vertebral artery C3-4 level.  CTA HEAD  Internal carotid artery cavernous segment calcification with mild narrowing bilaterally.  Anterior circulation without medium or large size vessel significant stenosis or occlusion.  Right vertebral artery is dominant and mildly ectatic.  Narrowing distal left vertebral artery.  Ectatic basilar artery with mild narrowing but without high-grade  stenosis.  No significant stenosis of the proximal posterior cerebral arteries. Fetal type contribution on right. Narrowing distal aspect of the posterior cerebral arteries bilaterally.   Electronically Signed   By: Genia Del M.D.   On: 04/07/2015 15:36   Ct Angio Neck W/cm &/or Wo/cm  04/07/2015   CLINICAL DATA:  81 year old female with acute intracranial infarcts. Presented with nausea, vomiting and dizziness. Subsequent encounter.  EXAM: CT ANGIOGRAPHY HEAD AND NECK  TECHNIQUE: Multidetector CT imaging of the head and neck was performed using the standard protocol during bolus administration of intravenous contrast. Multiplanar CT image reconstructions and MIPs were obtained to evaluate the vascular anatomy. Carotid  stenosis measurements (when applicable) are obtained utilizing NASCET criteria, using the distal internal carotid diameter as the denominator.  CONTRAST:  77mL OMNIPAQUE IOHEXOL 350 MG/ML SOLN  COMPARISON:  04/06/2015 brain MR.  FINDINGS: CT HEAD  Brain: Left thalamic, left hippocampus and right pontine/cerebellar infarct. No intracranial hemorrhage. Global atrophy without hydrocephalus. No intracranial enhancing lesion.  Calvarium and skull base: Negative.  Paranasal sinuses: Opacification sphenoid sinuses greater on the right.  Orbits: Post lens replacement.  CTA NECK  Aortic arch: Ectatic 3 vessel aortic arch with plaque.  Right carotid system: Ectatic common carotid artery. Plaque right carotid bifurcation with 52% diameter stenosis proximal right internal carotid artery. Ectatic cervical segment.  Left carotid system: Ectatic common carotid artery. No significant stenosis left carotid bifurcation or proximal left internal carotid artery. Ectatic cervical segment.  Vertebral arteries:Plaque with mild narrowing proximal right vertebral artery. Mild narrowing proximal left vertebral artery. Moderate narrowing left vertebral artery C3-4 level.  Skeleton: Cervical spondylotic changes most notable C4-5 thru C5-6.  Other neck: No worrisome primary neck mass or lung apical lesion.  CTA HEAD  Anterior circulation: Internal carotid artery cavernous segment calcification with mild narrowing bilaterally. Anterior circulation without medium or large size vessel significant stenosis or occlusion.  Posterior circulation: Right vertebral artery is dominant and mildly ectatic. Narrowing distal left vertebral artery. Ectatic basilar artery with mild narrowing but without high-grade stenosis. No significant stenosis of the proximal posterior cerebral arteries. Fetal type contribution on right. Narrowing distal aspect of the posterior cerebral arteries bilaterally.  Venous sinuses: Negative  Anatomic variants: Negative  Delayed  phase: Negative  IMPRESSION: CT HEAD  Left thalamic, left hippocampus and right pontine/cerebellar infarct.  Opacification sphenoid sinuses greater on the right.  CTA NECK  Ectatic 3 vessel aortic arch with plaque.  Plaque right carotid bifurcation with 52% diameter stenosis proximal right internal carotid artery.  No significant stenosis left carotid bifurcation or proximal left internal carotid artery.  Plaque with mild narrowing proximal right vertebral artery.  Mild narrowing proximal left vertebral artery. Moderate narrowing left vertebral artery C3-4 level.  CTA HEAD  Internal carotid artery cavernous segment calcification with mild narrowing bilaterally.  Anterior circulation without medium or large size vessel significant stenosis or occlusion.  Right vertebral artery is dominant and mildly ectatic.  Narrowing distal left vertebral artery.  Ectatic basilar artery with mild narrowing but without high-grade stenosis.  No significant stenosis of the proximal posterior cerebral arteries. Fetal type contribution on right. Narrowing distal aspect of the posterior cerebral arteries bilaterally.   Electronically Signed   By: Genia Del M.D.   On: 04/07/2015 15:36   Mr Brain Wo Contrast  04/06/2015   CLINICAL DATA:  81 year old female with with nausea, vomiting and some dizziness. Hyperlipidemia. Subsequent encounter.  EXAM: MRI HEAD WITHOUT CONTRAST  TECHNIQUE: Multiplanar, multiecho pulse sequences  of the brain and surrounding structures were obtained without intravenous contrast.  COMPARISON:  04/05/2015 head CT.  11/18/2008 MR.  FINDINGS: Exam is motion degraded.  Left hippocampal restricted motion with hemorrhage probably represents hemorrhagic infarct rather than herpes encephalitis given the surrounding infarcts as noted below. Clinical correlation recommended.  Acute moderate size nonhemorrhagic infarct posterior right upper and mid pons and adjacent right cerebellum.  Acute tiny nonhemorrhagic left  thalamic infarct.  Tiny acute nonhemorrhagic left occipital lobe infarct.  Global atrophy without hydrocephalus.  No intracranial mass lesion noted on this unenhanced exam.  Left vertebral artery may be occluded. Narrowed irregular right vertebral artery and basilar artery. Internal carotid arteries appear patent.  Complex opacification right sphenoid sinus and aerated aspect of the upper right pterygoid plate.  Mild mucosal thickening remainder of paranasal sinuses.  Elongated globes post lens replacement.  Transverse ligament hypertrophy. Cervical spondylotic changes with mild spinal stenosis C3-4 and C4-5. Cervical medullary junction unremarkable.  Partially empty expanded sella felt to be an incidental finding.  IMPRESSION: Left hippocampal restricted motion with hemorrhage probably represents hemorrhagic infarct rather than herpes encephalitis given the surrounding infarcts as noted below. Clinical correlation recommended.  Acute moderate size nonhemorrhagic infarct posterior right upper and mid pons and adjacent right cerebellum.  Acute tiny nonhemorrhagic left thalamic infarct.  Tiny acute nonhemorrhagic left occipital lobe infarct.  Left vertebral artery may be occluded. Narrowed irregular right vertebral artery and basilar artery. Internal carotid arteries appear patent.  Complex opacification right sphenoid sinus and aerated aspect of the upper right pterygoid plate.  These results will be called to the ordering clinician or representative by the Radiologist Assistant, and communication documented in the PACS or zVision Dashboard.   Electronically Signed   By: Genia Del M.D.   On: 04/06/2015 16:23    Scheduled Meds:  Scheduled Meds: . aspirin EC  325 mg Oral Daily  . calcium carbonate  500 mg of elemental calcium Oral Daily  . ipratropium-albuterol  3 mL Nebulization BID  . mometasone-formoterol  2 puff Inhalation BID  . pravastatin  20 mg Oral q1800  . predniSONE  5 mg Oral Daily    Continuous Infusions:   Time spent on care of this patient: 35 min   [place], MD 04/08/2015, 8:44 AM  LOS: 1 day   [facility] Office  [phone] Pager - Text Page per [email or web address] If 7PM-7AM, please contact night-coverage [email or web address]

## 2015-04-08 NOTE — Progress Notes (Addendum)
STROKE TEAM PROGRESS NOTE  HPI Darlene Murray is a 81 y.o. female who presented yesterday after waking up at 6 AM with nausea and vomiting. She also noticed that she had trouble walking and felt like her voice was "weak." In the emergency room, this was felt to be likely due to her nausea and vomiting a possible gastritis and she was admitted for rehydration. She did have some improvement overnight, however when walking with physical therapy was noted that she was still very unsteady and therefore an MRI of the brain was obtained. This revealed multifocal infarcts and therefore neurology has been consulted.   LKW: 9/28 prior to bed tpa given?: no, out of window      SUBJECTIVE (INTERVAL HISTORY) The patient's husband and son are at the bedside.  CT angiogram shows a: Hyperplastic posterior circulation but no major occlusion or stenosis. Patient states her condition is a same. Has been seen by therapy who have recommended inpatient rehabilitation OBJECTIVE Temp:  [98.2 F (36.8 C)-98.7 F (37.1 C)] 98.6 F (37 C) (10/02 1110) Pulse Rate:  [70-93] 83 (10/02 1110) Cardiac Rhythm:  [-] Normal sinus rhythm;Bundle branch block (10/02 0703) Resp:  [16-17] 16 (10/02 1110) BP: (116-148)/(55-71) 116/57 mmHg (10/02 1110) SpO2:  [93 %-99 %] 98 % (10/02 1110)  CBC:   Recent Labs Lab 04/05/15 2252 04/06/15 0520  WBC 8.2 8.1  NEUTROABS 5.5 5.0  HGB 14.2 13.5  HCT 43.0 41.3  MCV 89.0 88.4  PLT 244 397    Basic Metabolic Panel:   Recent Labs Lab 04/05/15 2252 04/06/15 0520  NA 136 136  K 3.8 3.8  CL 103 103  CO2 25 25  GLUCOSE 113* 103*  BUN 13 11  CREATININE 1.00 0.96  CALCIUM 8.9 8.6*    Lipid Panel:     Component Value Date/Time   CHOL 161 04/07/2015 0622   TRIG 47 04/07/2015 0622   HDL 48 04/07/2015 0622   CHOLHDL 3.4 04/07/2015 0622   VLDL 9 04/07/2015 0622   LDLCALC 104* 04/07/2015 0622   HgbA1c:  Lab Results  Component Value Date   HGBA1C  11/19/2008    5.6 (NOTE) The ADA recommends the following therapeutic goal for glycemic control related to Hgb A1c measurement: Goal of therapy: <6.5 Hgb A1c  Reference: American Diabetes Association: Clinical Practice Recommendations 2010, Diabetes Care, 2010, 33: (Suppl  1).   Urine Drug Screen: No results found for: LABOPIA, COCAINSCRNUR, LABBENZ, AMPHETMU, THCU, LABBARB    IMAGING   Ct Head Wo Contrast 04/05/2015    1.  No acute intracranial abnormality.  Stable atrophy.  2. Paranasal sinus inflammatory change, progressed from prior.      Mr Brain Wo Contrast 04/06/2015    Left hippocampal restricted motion with hemorrhage hemorrhagic infarct rather than herpes encephalitis given the surrounding infarcts as noted below. Clinical correlation recommended.  Acute moderate size nonhemorrhagic infarct posterior right upper and mid pons and adjacent right cerebellum.  Acute tiny nonhemorrhagic left thalamic infarct.  Tiny acute nonhemorrhagic left occipital lobe infarct.  Left vertebral artery may be occluded. Narrowed irregular right vertebral artery and basilar artery. Internal carotid arteries appear patent. Complex opacification right sphenoid sinus and aerated aspect of the upper right pterygoid plate.      Dg Chest Port 1 View 04/05/2015    No active cardiopulmonary disease.      CT angiogram of the head and neck -  no significant large vessel extracranial stenosis. Mild narrowing of intracranial distal left  vertebral artery which is hypoplastic. Ectatic basilar artery with mild narrowing but no high-grade stenosis. Right vertebral artery is dominant    PHYSICAL EXAM Pleasant frail elderly African-American lady currently not in distress.. . Afebrile. Head is nontraumatic. Neck is supple without bruit.    Cardiac exam no murmur or gallop. Lungs are clear to auscultation. Distal pulses are well felt. Neurological Exam :  Awake alert oriented to time place and person. Slightly diminished attention  and recall. Follows commands well. No dysarthria or aphasia. Extraocular moments are full range except for restriction of vertical gaze. Pupils irregular but reactive. Vision acuity seems adequate. Blinks to threat bilaterally. No facial weakness. Tongue midline. Good cough and gag. Motor system exam reveals no upper or lower eczema to drift. No focal weakness. Mild diminished finger-to-nose coordination in the right. Deep tendon reflexes are 2+ symmetric. Sensation is intact bilaterally. Plantars are downgoing. Gait was not tested.    ASSESSMENT/PLAN [name] is a 81 y.o. female with history of blindness, asthma, hyperlipidemia, and low back problems,  presenting with unstable gait, with nausea and vomiting.. She did not receive IV t-PA due to late presentation.   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 Suspect atrial fibrillation.  Resultant  mild right-sided dysmetria and restricted upgaze  MRI  multiple infarcts as noted above.  MRA  not performed  CT angiogram of the head and neck - as above  Carotid Doppler refer to the CT angiogram of the head and neck  2D Echo - pending  LDL - 104  HgbA1c pending  VTE prophylaxis - SCDs Diet regular Room service appropriate?: Yes; Fluid consistency:: Thin  no antithrombotic prior to admission, now on aspirin 325 mg orally every day  Patient counseled to be compliant with her antithrombotic medications  Ongoing aggressive stroke risk factor management  Therapy recommendations: SNF recommended by physical therapy  Disposition:  Pending  Hypertension  Stable - mildly high at times  Permissive hypertension (OK if < 220/120) but gradually normalize in 5-7 days  Hyperlipidemia  Home meds: No lipid lowering medications prior to admission.  LDL 104, goal < 70  Add Pravachol 20 mg daily  Continue statin at discharge   Other Stroke Risk Factors  Advanced  age  ETOH use  Obesity, Body mass index is 32.4 kg/(m^2).    Other Active Problems  Prednisone 5 mg daily - suspect this is for patient's asthma history.  30 day outpatient event monitor planned  Hospital day # 1      I have personally examined this patient, reviewed notes, independently viewed imaging studies, participated in medical decision making and plan of care. I have made any additions or clarifications directly to the above note. Agree with note above. 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   I had a long discussion with the patient's husband and son at the bedside and answered questions. Continue aspirin for now. Strong clinical suspicion for atrial fibrillation and she will need workup for that. Plan to do 30 day outpatient monitor and not TEE/Loop given fact she is fall risk and not a good long term anticoagulation candidate.I had a long discussion with the patient's, husband and son at the bedside and answered questions. Discussed with Dr. Wynelle Cleveland.  Antony Contras, MD Medical Director [facility] Pager: [phone] 04/08/2015 12:11 PM     To contact Stroke Continuity provider, please refer to [email or web address] After hours, contact General Neurology

## 2015-04-09 DIAGNOSIS — I63139 Cerebral infarction due to embolism of unspecified carotid artery: Secondary | ICD-10-CM

## 2015-04-09 DIAGNOSIS — I69393 Ataxia following cerebral infarction: Secondary | ICD-10-CM

## 2015-04-09 DIAGNOSIS — I638 Other cerebral infarction: Secondary | ICD-10-CM

## 2015-04-09 LAB — HEMOGLOBIN A1C
HEMOGLOBIN A1C: 6 % — AB (ref 4.8–5.6)
Mean Plasma Glucose: 126 mg/dL

## 2015-04-09 NOTE — Progress Notes (Signed)
Rehab Admissions Coordinator Note:  Patient was screened by [name] for appropriateness for an Inpatient Acute Rehab Consult.  At this time, an inpatient rehab consult is pending.  Danne Baxter will follow up once consult is completed.  She can be reached at [phone]  [name] 04/09/2015, 8:15 AM

## 2015-04-09 NOTE — Clinical Social Work Placement (Signed)
   CLINICAL SOCIAL WORK PLACEMENT  NOTE  Date:  04/09/2015  Patient Details  Name: Darlene Murray MRN: [id number] Date of Birth: 10/30/1921  Clinical Social Work is seeking post-discharge placement for this patient at the [place] level of care (*CSW will initial, date and re-position this form in  chart as items are completed):  Yes   Patient/family provided with [facility]'s list of facilities offering this level of care within the geographic area requested by the patient (or if unable, by the patient's family).  Yes   Patient/family informed of their freedom to choose among providers that offer the needed level of care, that participate in Medicare, Medicaid or managed care program needed by the patient, have an available bed and are willing to accept the patient.  Yes   Patient/family informed of [facility]'s ownership interest in [facility] and [facility], as well as of the fact that they are under no obligation to receive care at these facilities.  PASRR submitted to EDS on 04/09/15     PASRR number received on 04/09/15     Existing PASRR number confirmed on       FL2 transmitted to all facilities in geographic area requested by pt/family on 04/09/15     FL2 transmitted to all facilities within larger geographic area on       Patient informed that his/her managed care company has contracts with or will negotiate with certain facilities, including the following:            Patient/family informed of bed offers received.  Patient chooses bed at       Physician recommends and patient chooses bed at      Patient to be transferred to   on  .  Patient to be transferred to facility by       Patient family notified on   of transfer.  Name of family member notified:        PHYSICIAN       Additional Comment:    _______________________________________________ Liz Beach MSW, [place], [place], [phone]

## 2015-04-09 NOTE — Clinical Social Work Note (Signed)
Clinical Social Work Assessment  Patient Details  Name: Darlene Murray MRN: [id number] Date of Birth: 1922-06-06  Date of referral:  04/09/15               Reason for consult:  Facility Placement, Discharge Planning                Permission sought to share information with:  Family Supports, Chartered certified accountant granted to share information::  Yes, Verbal Permission Granted  Name::     Risk manager::  SNFs  Relationship::     Contact Information:     Housing/Transportation Living arrangements for the past 2 months:  [place] of Information:  Patient, Spouse Patient Interpreter Needed:  None Criminal Activity/Legal Involvement Pertinent to Current Situation/Hospitalization:  No - Comment as needed Significant Relationships:  Adult Children, Spouse Lives with:  Spouse Do you feel safe going back to the place where you live?  Yes Need for family participation in patient care:  Yes (Comment)  Care giving concerns:  Patient and patient's husband would like patient to go to rehab at discharge before she returns home.   Social Worker assessment / plan:  CSW met with patient and patient's husband at bedside to complete assessment. Patient deferred to her husband to answer questions. Patient's husband states that they are hoping the patient will be admitted to inpatient rehab at discharge. CSW discussed having an alternative in mind in the event inpatient rehab is unable to accept the patient at discharge. Patient and patient's husband are agreeable to this. CSW explained SNF search/placement process and answered the patient and husband's questions. CSW will followup with available bed offers.  Employment status:  Retired Health visitor, Managed Care PT Recommendations:  Inpatient [facility] / Referral to community resources:  [place]  Patient/Family's Response to care:  Patient and family appear to be  happy with the care the patient has received.  Patient/Family's Understanding of and Emotional Response to Diagnosis, Current Treatment, and Prognosis:  Patient and husband appear to have good understanding of reason for admission. Both seem to understand what the patient's post DC needs will be.   Emotional Assessment Appearance:  Appears stated age Attitude/Demeanor/Rapport:  Other (Appropriate) Affect (typically observed):  Accepting, Appropriate, Calm, Pleasant Orientation:  Oriented to Self, Oriented to Place, Oriented to  Time, Oriented to Situation Alcohol / Substance use:  Alcohol Use Psych involvement (Current and /or in the community):  No (Comment)  Discharge Needs  Concerns to be addressed:  Discharge Planning Concerns Readmission within the last 30 days:  No Current discharge risk:  Physical Impairment Barriers to Discharge:  Continued Medical Work up  [employer] MSW, [place], [place], [phone]

## 2015-04-09 NOTE — Progress Notes (Signed)
STROKE TEAM PROGRESS NOTE  HPI Darlene Murray is a 81 y.o. female who presented yesterday after waking up at 6 AM with nausea and vomiting. She also noticed that she had trouble walking and felt like her voice was "weak." In the emergency room, this was felt to be likely due to her nausea and vomiting a possible gastritis and she was admitted for rehydration. She did have some improvement overnight, however when walking with physical therapy was noted that she was still very unsteady and therefore an MRI of the brain was obtained. This revealed multifocal infarcts and therefore neurology has been consulted.   LKW: 9/28 prior to bed tpa given?: no, out of window      SUBJECTIVE (INTERVAL HISTORY) The patient's husband and son are at the bedside.  She is feeling better today and is in good spirits.Has been seen by therapy who have recommended inpatient rehabilitation OBJECTIVE Temp:  [97.8 F (36.6 C)-98.7 F (37.1 C)] 97.8 F (36.6 C) (10/03 0925) Pulse Rate:  [73-82] 73 (10/03 0925) Cardiac Rhythm:  [-] Normal sinus rhythm;Bundle branch block (10/03 0700) Resp:  [16-20] 20 (10/03 0925) BP: (115-132)/(57-69) 132/69 mmHg (10/03 0925) SpO2:  [96 %-98 %] 98 % (10/03 0925)  CBC:   Recent Labs Lab 04/05/15 2252 04/06/15 0520  WBC 8.2 8.1  NEUTROABS 5.5 5.0  HGB 14.2 13.5  HCT 43.0 41.3  MCV 89.0 88.4  PLT 244 169    Basic Metabolic Panel:   Recent Labs Lab 04/05/15 2252 04/06/15 0520  NA 136 136  K 3.8 3.8  CL 103 103  CO2 25 25  GLUCOSE 113* 103*  BUN 13 11  CREATININE 1.00 0.96  CALCIUM 8.9 8.6*    Lipid Panel:     Component Value Date/Time   CHOL 161 04/07/2015 0622   TRIG 47 04/07/2015 0622   HDL 48 04/07/2015 0622   CHOLHDL 3.4 04/07/2015 0622   VLDL 9 04/07/2015 0622   LDLCALC 104* 04/07/2015 0622   HgbA1c:  Lab Results  Component Value Date   HGBA1C  11/19/2008    5.6 (NOTE) The ADA recommends the following therapeutic goal for glycemic  control related to Hgb A1c measurement: Goal of therapy: <6.5 Hgb A1c  Reference: American Diabetes Association: Clinical Practice Recommendations 2010, Diabetes Care, 2010, 33: (Suppl  1).   Urine Drug Screen: No results found for: LABOPIA, COCAINSCRNUR, LABBENZ, AMPHETMU, THCU, LABBARB    IMAGING   Ct Head Wo Contrast 04/05/2015    1.  No acute intracranial abnormality.  Stable atrophy.  2. Paranasal sinus inflammatory change, progressed from prior.      Mr Brain Wo Contrast 04/06/2015    Left hippocampal restricted motion with hemorrhage hemorrhagic infarct rather than herpes encephalitis given the surrounding infarcts as noted below. Clinical correlation recommended.  Acute moderate size nonhemorrhagic infarct posterior right upper and mid pons and adjacent right cerebellum.  Acute tiny nonhemorrhagic left thalamic infarct.  Tiny acute nonhemorrhagic left occipital lobe infarct.  Left vertebral artery may be occluded. Narrowed irregular right vertebral artery and basilar artery. Internal carotid arteries appear patent. Complex opacification right sphenoid sinus and aerated aspect of the upper right pterygoid plate.      Dg Chest Port 1 View 04/05/2015    No active cardiopulmonary disease.      CT angiogram of the head and neck -  no significant large vessel extracranial stenosis. Mild narrowing of intracranial distal left vertebral artery which is hypoplastic. Ectatic basilar artery with mild  narrowing but no high-grade stenosis. Right vertebral artery is dominant    PHYSICAL EXAM Pleasant frail elderly African-American lady currently not in distress.. . Afebrile. Head is nontraumatic. Neck is supple without bruit.    Cardiac exam no murmur or gallop. Lungs are clear to auscultation. Distal pulses are well felt. Neurological Exam :  Awake alert oriented to time place and person. Slightly diminished attention and recall. Follows commands well. No dysarthria or aphasia. Extraocular  moments are full range except for restriction of vertical gaze. Pupils irregular but reactive. Vision acuity seems adequate. Blinks to threat bilaterally. No facial weakness. Tongue midline. Good cough and gag. Motor system exam reveals no upper or lower eczema to drift. No focal weakness. Mild diminished finger-to-nose coordination in the right. Deep tendon reflexes are 2+ symmetric. Sensation is intact bilaterally. Plantars are downgoing. Gait was not tested.    ASSESSMENT/PLAN [name] is a 81 y.o. female with history of blindness, asthma, hyperlipidemia, and low back problems,  presenting with unstable gait, with nausea and vomiting.. She did not receive IV t-PA due to late presentation.   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 Suspect atrial fibrillation.  Resultant  mild right-sided dysmetria and restricted upgaze  MRI  multiple infarcts as noted above.  MRA  not performed  CT angiogram of the head and neck - as above  Carotid Doppler refer to the CT angiogram of the head and neck  2D Echo - The cavity size was normal. Systolic function was normal. The estimated ejection fraction was in the range of 60% to 65%. Wall motion was normal; there were no regional wall motion abnormalities.   LDL - 104  HgbA1c pending  VTE prophylaxis - SCDs Diet regular Room service appropriate?: Yes; Fluid consistency:: Thin Diet - low sodium heart healthy  no antithrombotic prior to admission, now on aspirin 325 mg orally every day  Patient counseled to be compliant with her antithrombotic medications  Ongoing aggressive stroke risk factor management  Therapy recommendations: CLR recommended by physical therapy  Disposition:  Pending  Hypertension  Stable - mildly high at times  Permissive hypertension (OK if < 220/120) but gradually normalize in 5-7 days  Hyperlipidemia  Home meds: No lipid lowering medications  prior to admission.  LDL 104, goal < 70  Add Pravachol 20 mg daily  Continue statin at discharge   Other Stroke Risk Factors  Advanced age  ETOH use  Obesity, Body mass index is 32.4 kg/(m^2).    Other Active Problems  Prednisone 5 mg daily - suspect this is for patient's asthma history.  30 day outpatient event monitor planned  Hospital day # 2      I have personally examined this patient, reviewed notes, independently viewed imaging studies, participated in medical decision making and plan of care. I have made any additions or clarifications directly to the above note.   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   I had a long discussion with the patient's husband and son at the bedside and answered questions. Continue aspirin for now. Strong clinical suspicion for atrial fibrillation and she will need workup for that. Plan to do 30 day outpatient monitor and not TEE/Loop given fact she is fall risk and not a good long term anticoagulation candidate.I had a long discussion  with the patient's, husband and son at the bedside and answered questions. Discussed with Dr. Wynelle Cleveland.Stroke team will sign off. Call for questions.Follow up as outpatient in stroke clinic in 2 months or call earlier if necessary  Antony Contras, [place] Pager: [phone] 04/09/2015 12:37 PM     To contact Stroke Continuity provider, please refer to [email or web address] After hours, contact General Neurology

## 2015-04-09 NOTE — Consult Note (Signed)
Physical Medicine and Rehabilitation Consult Reason for Consult: Bilateral hemorrhagic and nonhemorrhagic infarcts felt to be embolic Referring Physician: [facility]   HPI: Darlene Murray is a 81 y.o. Darlene Murray is a 81 y.o. right handed female with history of steroid-dependent for asthma and hyperlipidemia. Patient lives with spouse and was independent prior to admission. Spouse is retired and able to care for pt on discharge. Presented 04/06/2015 with nausea and vomiting, dizziness, decrease in balance and generalized weakness as well as some mild slurred speech. MRI imaging revealed acute moderate sized nonhemorrhagic infarct posterior right upper and mid pons and adjacent right cerebellum. Acute tiny nonhemorrhagic infarct left thalamic infarct. Acute nonhemorrhagic left occipital lobe infarct. CTA of head and neck with no significant stenosis or occlusion. Echocardiogram results are pending. Patient did not receive TPA. Neurology consulted maintain on aspirin 325 mg daily for CVA prophylaxis. Patient continues on low dose prednisone for history of asthma. Tolerating a regular consistency diet. Therapies have been initiated. M.D. has requested physical medicine rehabilitation consult   Review of Systems  Constitutional: Negative for fever and chills.  HENT: Negative for hearing loss.   Eyes: Negative for pain.  Respiratory: Negative for cough.        Shortness of breath on exertion  Cardiovascular: Positive for palpitations. Negative for chest pain and leg swelling.  Gastrointestinal: Positive for constipation. Negative for nausea and vomiting.  Genitourinary: Negative for dysuria and hematuria.  Musculoskeletal: Positive for myalgias and joint pain.  Skin: Negative for rash.  Neurological: Positive for weakness. Negative for seizures, loss of consciousness and headaches.  All other systems reviewed and are negative.  Past Medical History  Diagnosis Date  . Blindness    . Asthmatic bronchitis   . Asthma   . Fibrocystic breast disease   . Allergic rhinitis   . Hyperlipidemia   . Venous insufficiency     lower extremities  . HNP (herniated nucleus pulposus), lumbar     L4-L5  . Left ventricular hypertrophy 2008    mild  . Antibiotic-associated diarrhea    History reviewed. No pertinent past surgical history. Family History  Problem Relation Age of Onset  . Heart attack Sister   . Alzheimer's disease Sister   . Leukemia Mother   . Asthma Son    Social History:  reports that she has never smoked. She has never used smokeless tobacco. She reports that she drinks alcohol. She reports that she does not use illicit drugs. Allergies: No Known Allergies Medications Prior to Admission  Medication Sig Dispense Refill  . albuterol (PROVENTIL HFA;VENTOLIN HFA) 108 (90 BASE) MCG/ACT inhaler Inhale 2 puffs into the lungs every 6 (six) hours as needed for wheezing or shortness of breath. 1 Inhaler 6  . Fluticasone Furoate-Vilanterol (BREO ELLIPTA) 200-25 MCG/INH AEPB Inhale 1 puff into the lungs daily. 60 each 6  . calcium carbonate (OS-CAL) 600 MG TABS Take 600 mg by mouth daily.      . Multiple Vitamin (MULTIVITAMIN) tablet Take 1 tablet by mouth daily.    . predniSONE (DELTASONE) 5 MG tablet Take 1 tablet (5 mg total) by mouth daily. (Patient not taking: Reported on 04/06/2015) 30 tablet 5  . Pyridoxine HCl (VITAMIN B-6 PO) Take 1 tablet by mouth daily.    . traZODone (DESYREL) 50 MG tablet Take 50 mg by mouth at bedtime as needed.       Home: Home Living Family/patient expects to be discharged to:: Private residence Living Arrangements:  Spouse/significant other Available Help at Discharge: Family, Available 24 hours/day Type of Home: House Home Access: Level entry Home Layout: One level Bathroom Shower/Tub: Multimedia programmer: Handicapped height Home Equipment: Environmental consultant - 2 wheels, [place] - single point, Grab bars - tub/shower  Lives With:  Spouse  Functional History: Prior Function Level of Independence: Independent Functional Status:  Mobility: Bed Mobility Overal bed mobility: Needs Assistance Bed Mobility: Rolling, Sidelying to Sit Rolling: Min assist Sidelying to sit: Mod assist Supine to sit: Min assist General bed mobility comments: initially attempted supine to EOB with pt demostrating little initaition at trunk and BUE, cues for BLE advancement. Switched to logrolling technique with pt completing as detailed above. Cues for sequencing, assist for powerup. Transfers Overall transfer level: Needs assistance Equipment used: Rolling walker (2 wheeled) Transfers: Sit to/from Stand Sit to Stand: Min assist Stand pivot transfers: Mod assist General transfer comment: cues for hand placement. 2x from EOB with low bed height, 1x from recliner. Cues to scoot forward prior to standing with assist provided for scooting.  Ambulation/Gait Ambulation/Gait assistance: Mod assist Ambulation Distance (Feet): 10 Feet Assistive device: Rolling walker (2 wheeled) Gait Pattern/deviations: Step-through pattern, Decreased stride length, Ataxic, Wide base of support, Trunk flexed, Staggering left General Gait Details: Mod assist for balance and walker control especially with turns. Drifts left and requires cues for correction with facilitory assist at times. No buckling although very ataxic foot placement. Gait velocity: slow Gait velocity interpretation: <1.8 ft/sec, indicative of risk for recurrent falls    ADL: ADL Overall ADL's : Needs assistance/impaired Eating/Feeding: Set up, Bed level, Sitting Eating/Feeding Details (indicate cue type and reason): poor apetite Grooming: Wash/dry hands, Wash/dry face, Oral care, Brushing hair, Moderate assistance, Sitting Grooming Details (indicate cue type and reason): completed functional mobility from EOB to sink towards goal of grooming standing at sink Upper Body Bathing: Minimal  assitance, Sitting Lower Body Bathing: Maximal assistance, Sit to/from stand Upper Body Dressing : Moderate assistance, Sitting Lower Body Dressing: Maximal assistance, Sit to/from stand Toilet Transfer: Moderate assistance, Stand-pivot, BSC Toileting- Clothing Manipulation and Hygiene: Maximal assistance, Sit to/from stand Functional mobility during ADLs: Moderate assistance, Rolling walker General ADL Comments: Pt completed bed mobility as detailed below. Pt completed in-room functional mobility with mod A and chair follow. Pt ambulated to sink towards ADL grooming goal. Pt stating after ambulation that she felt tired, mostly in her BLE. Cues needed throughout session for hand placement, sequencing.   Cognition: Cognition Overall Cognitive Status: Impaired/Different from baseline Orientation Level: Oriented to person, Oriented to place, Oriented to time, Disoriented to situation Memory: Appears intact Awareness: Appears intact Problem Solving: Appears intact Safety/Judgment: Appears intact Cognition Arousal/Alertness: Awake/alert Behavior During Therapy: Flat affect Overall Cognitive Status: Impaired/Different from baseline Area of Impairment: Safety/judgement Safety/Judgement: Decreased awareness of deficits, Decreased awareness of safety General Comments: Pt stating she was not sure if she could complete supine>EOB. Stated that she had not walked since hospital admission, reminded her of walking with PT.  Blood pressure 132/69, pulse 73, temperature 97.8 F (36.6 C), temperature source Oral, resp. rate 20, height 5\' 2"  (1.575 m), weight 80.377 kg (177 lb 3.2 oz), SpO2 98 %. Physical Exam  Nursing note and vitals reviewed. Constitutional: She is oriented to person, place, and time. She appears well-developed and well-nourished.  HENT:  Head: Normocephalic and atraumatic.  Eyes: Conjunctivae and EOM are normal.  Neck: Normal range of motion. Neck supple. No thyromegaly present.    Cardiovascular: Normal rate, regular rhythm  and normal heart sounds.   Respiratory: Effort normal and breath sounds normal. No respiratory distress.  GI: Soft. Bowel sounds are normal. She exhibits no distension.  Musculoskeletal:  RUE 4-/5 LUE 4+/5 LLE 3/5 (history of back pain) RLE 5/5  Neurological: She is alert and oriented to person, place, and time.  Ataxia RUE CN II-XII grossly intact Oriented to person place and date of birth. Follows simple commands. Fair awareness of deficits  Skin: Skin is warm and dry.  Psychiatric: She has a normal mood and affect. Her behavior is normal.    No results found for this or any previous visit (from the past 24 hour(s)). Ct Angio Head W/cm &/or Wo Cm  04/07/2015   CLINICAL DATA:  81 year old female with acute intracranial infarcts. Presented with nausea, vomiting and dizziness. Subsequent encounter.  EXAM: CT ANGIOGRAPHY HEAD AND NECK  TECHNIQUE: Multidetector CT imaging of the head and neck was performed using the standard protocol during bolus administration of intravenous contrast. Multiplanar CT image reconstructions and MIPs were obtained to evaluate the vascular anatomy. Carotid stenosis measurements (when applicable) are obtained utilizing NASCET criteria, using the distal internal carotid diameter as the denominator.  CONTRAST:  89mL OMNIPAQUE IOHEXOL 350 MG/ML SOLN  COMPARISON:  04/06/2015 brain MR.  FINDINGS: CT HEAD  Brain: Left thalamic, left hippocampus and right pontine/cerebellar infarct. No intracranial hemorrhage. Global atrophy without hydrocephalus. No intracranial enhancing lesion.  Calvarium and skull base: Negative.  Paranasal sinuses: Opacification sphenoid sinuses greater on the right.  Orbits: Post lens replacement.  CTA NECK  Aortic arch: Ectatic 3 vessel aortic arch with plaque.  Right carotid system: Ectatic common carotid artery. Plaque right carotid bifurcation with 52% diameter stenosis proximal right internal carotid  artery. Ectatic cervical segment.  Left carotid system: Ectatic common carotid artery. No significant stenosis left carotid bifurcation or proximal left internal carotid artery. Ectatic cervical segment.  Vertebral arteries:Plaque with mild narrowing proximal right vertebral artery. Mild narrowing proximal left vertebral artery. Moderate narrowing left vertebral artery C3-4 level.  Skeleton: Cervical spondylotic changes most notable C4-5 thru C5-6.  Other neck: No worrisome primary neck mass or lung apical lesion.  CTA HEAD  Anterior circulation: Internal carotid artery cavernous segment calcification with mild narrowing bilaterally. Anterior circulation without medium or large size vessel significant stenosis or occlusion.  Posterior circulation: Right vertebral artery is dominant and mildly ectatic. Narrowing distal left vertebral artery. Ectatic basilar artery with mild narrowing but without high-grade stenosis. No significant stenosis of the proximal posterior cerebral arteries. Fetal type contribution on right. Narrowing distal aspect of the posterior cerebral arteries bilaterally.  Venous sinuses: Negative  Anatomic variants: Negative  Delayed phase: Negative  IMPRESSION: CT HEAD  Left thalamic, left hippocampus and right pontine/cerebellar infarct.  Opacification sphenoid sinuses greater on the right.  CTA NECK  Ectatic 3 vessel aortic arch with plaque.  Plaque right carotid bifurcation with 52% diameter stenosis proximal right internal carotid artery.  No significant stenosis left carotid bifurcation or proximal left internal carotid artery.  Plaque with mild narrowing proximal right vertebral artery.  Mild narrowing proximal left vertebral artery. Moderate narrowing left vertebral artery C3-4 level.  CTA HEAD  Internal carotid artery cavernous segment calcification with mild narrowing bilaterally.  Anterior circulation without medium or large size vessel significant stenosis or occlusion.  Right vertebral  artery is dominant and mildly ectatic.  Narrowing distal left vertebral artery.  Ectatic basilar artery with mild narrowing but without high-grade stenosis.  No significant stenosis of the  proximal posterior cerebral arteries. Fetal type contribution on right. Narrowing distal aspect of the posterior cerebral arteries bilaterally.   Electronically Signed   By: Genia Del M.D.   On: 04/07/2015 15:36   Ct Angio Neck W/cm &/or Wo/cm  04/07/2015   CLINICAL DATA:  81 year old female with acute intracranial infarcts. Presented with nausea, vomiting and dizziness. Subsequent encounter.  EXAM: CT ANGIOGRAPHY HEAD AND NECK  TECHNIQUE: Multidetector CT imaging of the head and neck was performed using the standard protocol during bolus administration of intravenous contrast. Multiplanar CT image reconstructions and MIPs were obtained to evaluate the vascular anatomy. Carotid stenosis measurements (when applicable) are obtained utilizing NASCET criteria, using the distal internal carotid diameter as the denominator.  CONTRAST:  92mL OMNIPAQUE IOHEXOL 350 MG/ML SOLN  COMPARISON:  04/06/2015 brain MR.  FINDINGS: CT HEAD  Brain: Left thalamic, left hippocampus and right pontine/cerebellar infarct. No intracranial hemorrhage. Global atrophy without hydrocephalus. No intracranial enhancing lesion.  Calvarium and skull base: Negative.  Paranasal sinuses: Opacification sphenoid sinuses greater on the right.  Orbits: Post lens replacement.  CTA NECK  Aortic arch: Ectatic 3 vessel aortic arch with plaque.  Right carotid system: Ectatic common carotid artery. Plaque right carotid bifurcation with 52% diameter stenosis proximal right internal carotid artery. Ectatic cervical segment.  Left carotid system: Ectatic common carotid artery. No significant stenosis left carotid bifurcation or proximal left internal carotid artery. Ectatic cervical segment.  Vertebral arteries:Plaque with mild narrowing proximal right vertebral artery.  Mild narrowing proximal left vertebral artery. Moderate narrowing left vertebral artery C3-4 level.  Skeleton: Cervical spondylotic changes most notable C4-5 thru C5-6.  Other neck: No worrisome primary neck mass or lung apical lesion.  CTA HEAD  Anterior circulation: Internal carotid artery cavernous segment calcification with mild narrowing bilaterally. Anterior circulation without medium or large size vessel significant stenosis or occlusion.  Posterior circulation: Right vertebral artery is dominant and mildly ectatic. Narrowing distal left vertebral artery. Ectatic basilar artery with mild narrowing but without high-grade stenosis. No significant stenosis of the proximal posterior cerebral arteries. Fetal type contribution on right. Narrowing distal aspect of the posterior cerebral arteries bilaterally.  Venous sinuses: Negative  Anatomic variants: Negative  Delayed phase: Negative  IMPRESSION: CT HEAD  Left thalamic, left hippocampus and right pontine/cerebellar infarct.  Opacification sphenoid sinuses greater on the right.  CTA NECK  Ectatic 3 vessel aortic arch with plaque.  Plaque right carotid bifurcation with 52% diameter stenosis proximal right internal carotid artery.  No significant stenosis left carotid bifurcation or proximal left internal carotid artery.  Plaque with mild narrowing proximal right vertebral artery.  Mild narrowing proximal left vertebral artery. Moderate narrowing left vertebral artery C3-4 level.  CTA HEAD  Internal carotid artery cavernous segment calcification with mild narrowing bilaterally.  Anterior circulation without medium or large size vessel significant stenosis or occlusion.  Right vertebral artery is dominant and mildly ectatic.  Narrowing distal left vertebral artery.  Ectatic basilar artery with mild narrowing but without high-grade stenosis.  No significant stenosis of the proximal posterior cerebral arteries. Fetal type contribution on right. Narrowing distal aspect  of the posterior cerebral arteries bilaterally.   Electronically Signed   By: Genia Del M.D.   On: 04/07/2015 15:36    Assessment/Plan: Diagnosis: Bilateral hemorrhagic and nonhemorrhagic infarcts 1. Does the need for close, 24 hr/day medical supervision in concert with the patient's rehab needs make it unreasonable for this patient to be served in a less intensive setting? Yes 2. Co-Morbidities  requiring supervision/potential complications: Asthma 3. Due to safety, disease management, medication administration and patient education, does the patient require 24 hr/day rehab nursing? Yes 4. Does the patient require coordinated care of a physician, rehab nurse, PT (1-2 hrs/day, 5 days/week) and OT (1-2 hrs/day, 5 days/week) to address physical and functional deficits in the context of the above medical diagnosis(es)? Yes Addressing deficits in the following areas: balance, endurance, locomotion, strength, transferring, bathing, dressing, feeding, grooming, toileting and psychosocial support 5. Can the patient actively participate in an intensive therapy program of at least 3 hrs of therapy per day at least 5 days per week? Yes 6. The potential for patient to make measurable gains while on inpatient rehab is excellent 7. Anticipated functional outcomes upon discharge from inpatient rehab are supervision and min assist  with PT, modified independent and supervision with OT, n/a with SLP. 8. Estimated rehab length of stay to reach the above functional goals is: 13-17 days 9. Does the patient have adequate social supports and living environment to accommodate these discharge functional goals? Yes 10. Anticipated D/C setting: Home 11. Anticipated post D/C treatments: HH therapy and Home excercise program 12. Overall Rehab/Functional Prognosis: excellent  RECOMMENDATIONS: This patient's condition is appropriate for continued rehabilitative care in the following setting: CIR Patient has agreed to  participate in recommended program. Yes Note that insurance prior authorization may be required for reimbursement for recommended care.  Comment: Rehab Admissions Coordinator to follow up.   04/09/2015

## 2015-04-09 NOTE — Progress Notes (Signed)
Occupational Therapy Treatment Patient Details Name: Darlene Murray MRN: [id number] DOB: Jul 09, 1921 Today's Date: 04/09/2015    History of present illness Darlene Murray is a 81 y.o. female with Past medical history of blindness, asthma, dyslipidemia. The patient is presenting with progressive generalized weakness. MRI revealed + CVA.   OT comments  Pt progressing towards acute OT goals. Focus of session was functional mobility towards the goals of ADL completion via ambulating to toilet and grooming standing at sink. Pt completed in-room functional mobility from EOB to sink with mod A and chair follow for safety. Feel pt continues to be good candidate for CIR at d/c. OT to continue to follow acutely.   Follow Up Recommendations  CIR;Supervision/Assistance - 24 hour    Equipment Recommendations  3 in 1 bedside comode;Tub/shower seat    Recommendations for Other Services Rehab consult    Precautions / Restrictions Precautions Precautions: Fall Restrictions Weight Bearing Restrictions: No       Mobility Bed Mobility Overal bed mobility: Needs Assistance Bed Mobility: Rolling;Sidelying to Sit Rolling: Min assist Sidelying to sit: Mod assist       General bed mobility comments: initially attempted supine to EOB with pt demostrating little initaition at trunk and BUE, cues for BLE advancement. Switched to logrolling technique with pt completing as detailed above. Cues for sequencing, assist for powerup.  Transfers Overall transfer level: Needs assistance Equipment used: Rolling walker (2 wheeled) Transfers: Sit to/from Stand Sit to Stand: Min assist         General transfer comment: cues for hand placement. 2x from EOB with low bed height, 1x from recliner. Cues to scoot forward prior to standing with assist provided for scooting.     Balance Overall balance assessment: Needs assistance Sitting-balance support: No upper extremity supported;Feet  supported Sitting balance-Leahy Scale: Fair Sitting balance - Comments: sat EOB talking on phone with no LOB or swaying   Standing balance support: Bilateral upper extremity supported;During functional activity Standing balance-Leahy Scale: Poor Standing balance comment: mod A and rw                   ADL Overall ADL's : Needs assistance/impaired       Grooming Details (indicate cue type and reason): completed functional mobility from EOB to sink towards goal of grooming standing at sink                             Functional mobility during ADLs: Moderate assistance;Rolling walker General ADL Comments: Pt completed bed mobility as detailed below. Pt completed in-room functional mobility with mod A and chair follow. Pt ambulated to sink towards ADL grooming goal. Pt stating after ambulation that she felt tired, mostly in her BLE. Cues needed throughout session for hand placement, sequencing.       Vision                     Perception     Praxis      Cognition   Behavior During Therapy: Flat affect Overall Cognitive Status: Impaired/Different from baseline Area of Impairment: Safety/judgement          Safety/Judgement: Decreased awareness of deficits;Decreased awareness of safety     General Comments: Pt stating she was not sure if she could complete supine>EOB. Stated that she had not walked since hospital admission, reminded her of walking with PT.    Extremity/Trunk Assessment  Exercises     Shoulder Instructions       General Comments      Pertinent Vitals/ Pain       Pain Assessment: No/denies pain  Home Living                                          Prior Functioning/Environment              Frequency Min 2X/week     Progress Toward Goals  OT Goals(current goals can now be found in the care plan section)  Progress towards OT goals: Progressing toward goals  Acute Rehab OT  Goals Patient Stated Goal: Go home OT Goal Formulation: With patient/family Time For Goal Achievement: 04/21/15 Potential to Achieve Goals: Good ADL Goals Pt Will Perform Grooming: with min assist;standing Pt Will Perform Upper Body Bathing: with set-up;sitting Pt Will Perform Lower Body Bathing: with min assist;sit to/from stand Pt Will Perform Upper Body Dressing: with set-up;sitting Pt Will Perform Lower Body Dressing: with min assist;sit to/from stand Pt Will Transfer to Toilet: with min assist;ambulating;regular height toilet;bedside commode;grab bars Pt Will Perform Toileting - Clothing Manipulation and hygiene: with min assist;sit to/from stand  Plan Discharge plan remains appropriate    Co-evaluation                 End of Session Equipment Utilized During Treatment: Gait belt;Rolling walker   Activity Tolerance Patient tolerated treatment well   Patient Left in chair;with call bell/phone within reach;with chair alarm set;with family/visitor present   Nurse Communication          Time: 9449-6759 OT Time Calculation (min): 27 min  Charges: OT General Charges $OT Visit: 1 Procedure OT Treatments $Self Care/Home Management : 23-37 mins  Hortencia Pilar 04/09/2015, 10:25 AM

## 2015-04-09 NOTE — H&P (Signed)
Physical Medicine and Rehabilitation Admission H&P    Chief Complaint  Patient presents with  . Emesis  . Nausea  . Fatigue  : HPI: Darlene Murray is a 81 y.o. Darlene Murray is a 81 y.o. right handed female with history of steroid-dependent for asthma and hyperlipidemia. Patient lives with spouse and was independent prior to admission. Spouse is retired and able to care for pt on discharge. Presented 04/06/2015 with nausea and vomiting, dizziness, decrease in balance and generalized weakness as well as some mild slurred speech. MRI imaging revealed acute moderate sized nonhemorrhagic infarct posterior right upper and mid pons and adjacent right cerebellum. Acute tiny nonhemorrhagic infarct left thalamic infarct. Acute nonhemorrhagic left occipital lobe infarct. CTA of head and neck with no significant stenosis or occlusion. Echocardiogram results are pending. Patient did not receive TPA. Neurology consulted maintain on aspirin 325 mg daily for CVA prophylaxis. Patient continues on low dose prednisone for history of asthma. Tolerating a regular consistency diet. Therapies have been initiated. M.D. has requested physical medicine rehabilitation consult. Patient was admitted for comprehensive rehabilitation program  ROS Review of Systems  Constitutional: Negative for fever and chills.  HENT: Negative for hearing loss.  Eyes: Negative for pain.  Respiratory: Negative for cough.   Shortness of breath on exertion  Cardiovascular: Positive for palpitations. Negative for chest pain and leg swelling.  Gastrointestinal: Positive for constipation. Negative for nausea and vomiting.  Genitourinary: Negative for dysuria and hematuria.  Musculoskeletal: Positive for myalgias and joint pain.  Skin: Negative for rash.  Neurological: Positive for weakness. Negative for seizures, loss of consciousness and headaches.  All other systems reviewed and are negative   Past Medical History   Diagnosis Date  . Blindness   . Asthmatic bronchitis   . Asthma   . Fibrocystic breast disease   . Allergic rhinitis   . Hyperlipidemia   . Venous insufficiency     lower extremities  . HNP (herniated nucleus pulposus), lumbar     L4-L5  . Left ventricular hypertrophy 2008    mild  . Antibiotic-associated diarrhea    History reviewed. No pertinent past surgical history. Family History  Problem Relation Age of Onset  . Heart attack Sister   . Alzheimer's disease Sister   . Leukemia Mother   . Asthma Son    Social History:  reports that she has never smoked. She has never used smokeless tobacco. She reports that she drinks alcohol. She reports that she does not use illicit drugs. Allergies: No Known Allergies Medications Prior to Admission  Medication Sig Dispense Refill  . albuterol (PROVENTIL HFA;VENTOLIN HFA) 108 (90 BASE) MCG/ACT inhaler Inhale 2 puffs into the lungs every 6 (six) hours as needed for wheezing or shortness of breath. 1 Inhaler 6  . Fluticasone Furoate-Vilanterol (BREO ELLIPTA) 200-25 MCG/INH AEPB Inhale 1 puff into the lungs daily. 60 each 6  . calcium carbonate (OS-CAL) 600 MG TABS Take 600 mg by mouth daily.      . Multiple Vitamin (MULTIVITAMIN) tablet Take 1 tablet by mouth daily.    . predniSONE (DELTASONE) 5 MG tablet Take 1 tablet (5 mg total) by mouth daily. (Patient not taking: Reported on 04/06/2015) 30 tablet 5  . Pyridoxine HCl (VITAMIN B-6 PO) Take 1 tablet by mouth daily.    . traZODone (DESYREL) 50 MG tablet Take 50 mg by mouth at bedtime as needed.       Home: Home Living Family/patient expects to be discharged to::  Private residence Living Arrangements: Spouse/significant other Available Help at Discharge: Family, Available 24 hours/day Type of Home: House Home Access: Level entry [place]: One level Bathroom Shower/Tub: Multimedia programmer: [place]: Environmental consultant - 2 wheels, [place] - single point, Grab  bars - tub/shower  Lives With: Spouse   Functional History: Prior Function Level of Independence: Independent  Functional Status:  Mobility: Bed Mobility Overal bed mobility: Needs Assistance Bed Mobility: Rolling, Sidelying to Sit Rolling: Min assist Sidelying to sit: Mod assist Supine to sit: Min assist General bed mobility comments: initially attempted supine to EOB with pt demostrating little initaition at trunk and BUE, cues for BLE advancement. Switched to logrolling technique with pt completing as detailed above. Cues for sequencing, assist for powerup. Transfers Overall transfer level: Needs assistance Equipment used: Rolling walker (2 wheeled) Transfers: Sit to/from Stand Sit to Stand: Min assist Stand pivot transfers: Mod assist General transfer comment: cues for hand placement. 2x from EOB with low bed height, 1x from recliner. Cues to scoot forward prior to standing with assist provided for scooting.  Ambulation/Gait Ambulation/Gait assistance: Mod assist Ambulation Distance (Feet): 10 Feet Assistive device: Rolling walker (2 wheeled) Gait Pattern/deviations: Step-through pattern, Decreased stride length, Ataxic, Wide base of support, Trunk flexed, Staggering left General Gait Details: Mod assist for balance and walker control especially with turns. Drifts left and requires cues for correction with facilitory assist at times. No buckling although very ataxic foot placement. Gait velocity: slow Gait velocity interpretation: <1.8 ft/sec, indicative of risk for recurrent falls    ADL: ADL Overall ADL's : Needs assistance/impaired Eating/Feeding: Set up, Bed level, Sitting Eating/Feeding Details (indicate cue type and reason): poor apetite Grooming: Wash/dry hands, Wash/dry face, Oral care, Brushing hair, Moderate assistance, Sitting Grooming Details (indicate cue type and reason): completed functional mobility from EOB to sink towards goal of grooming standing at  sink Upper Body Bathing: Minimal assitance, Sitting Lower Body Bathing: Maximal assistance, Sit to/from stand Upper Body Dressing : Moderate assistance, Sitting Lower Body Dressing: Maximal assistance, Sit to/from stand Toilet Transfer: Moderate assistance, Stand-pivot, BSC Toileting- Clothing Manipulation and Hygiene: Maximal assistance, Sit to/from stand Functional mobility during ADLs: Moderate assistance, Rolling walker General ADL Comments: Pt completed bed mobility as detailed below. Pt completed in-room functional mobility with mod A and chair follow. Pt ambulated to sink towards ADL grooming goal. Pt stating after ambulation that she felt tired, mostly in her BLE. Cues needed throughout session for hand placement, sequencing.   Cognition: Cognition Overall Cognitive Status: Impaired/Different from baseline Orientation Level: Oriented to person, Oriented to place, Oriented to time, Disoriented to situation Memory: Appears intact Awareness: Appears intact Problem Solving: Appears intact Safety/Judgment: Appears intact Cognition Arousal/Alertness: Awake/alert Behavior During Therapy: Flat affect Overall Cognitive Status: Impaired/Different from baseline Area of Impairment: Safety/judgement Safety/Judgement: Decreased awareness of deficits, Decreased awareness of safety General Comments: Pt stating she was not sure if she could complete supine>EOB. Stated that she had not walked since hospital admission, reminded her of walking with PT.  Physical Exam: Blood pressure 132/69, pulse 73, temperature 97.8 F (36.6 C), temperature source Oral, resp. rate 20, height 5\' 2"  (1.575 m), weight 80.377 kg (177 lb 3.2 oz), SpO2 98 %. Physical Exam Constitutional: She is oriented to person, place, and time. She appears well-developed and well-nourished.  HENT:  Head: Normocephalic and atraumatic.  Eyes: Conjunctivae and EOM are normal.  Neck: Normal range of motion. Neck supple. No  thyromegaly present.  Cardiovascular: Normal rate, regular  rhythm and normal heart sounds.  Respiratory: Effort normal and breath sounds normal. No respiratory distress.  GI: Soft. Bowel sounds are normal. She exhibits no distension.  Musculoskeletal:  RUE 4-/5 LUE 4+/5 LLE 3/5 (history of back pain) RLE 5/5  Neurological: She is alert and oriented to person, place, and time.  Ataxia RUE CN II-XII grossly intact Oriented to person place and date of birth. Follows simple commands. Fair awareness of deficits  Skin: Skin is warm and dry.  Psychiatric: She has a normal mood and affect. Her behavior is normal   No results found for this or any previous visit (from the past 48 hour(s)). Ct Angio Head W/cm &/or Wo Cm  04/07/2015   CLINICAL DATA:  81 year old female with acute intracranial infarcts. Presented with nausea, vomiting and dizziness. Subsequent encounter.  EXAM: CT ANGIOGRAPHY HEAD AND NECK  TECHNIQUE: Multidetector CT imaging of the head and neck was performed using the standard protocol during bolus administration of intravenous contrast. Multiplanar CT image reconstructions and MIPs were obtained to evaluate the vascular anatomy. Carotid stenosis measurements (when applicable) are obtained utilizing NASCET criteria, using the distal internal carotid diameter as the denominator.  CONTRAST:  45mL OMNIPAQUE IOHEXOL 350 MG/ML SOLN  COMPARISON:  04/06/2015 brain MR.  FINDINGS: CT HEAD  Brain: Left thalamic, left hippocampus and right pontine/cerebellar infarct. No intracranial hemorrhage. Global atrophy without hydrocephalus. No intracranial enhancing lesion.  Calvarium and skull base: Negative.  Paranasal sinuses: Opacification sphenoid sinuses greater on the right.  Orbits: Post lens replacement.  CTA NECK  Aortic arch: Ectatic 3 vessel aortic arch with plaque.  Right carotid system: Ectatic common carotid artery. Plaque right carotid bifurcation with 52% diameter stenosis proximal right  internal carotid artery. Ectatic cervical segment.  Left carotid system: Ectatic common carotid artery. No significant stenosis left carotid bifurcation or proximal left internal carotid artery. Ectatic cervical segment.  Vertebral arteries:Plaque with mild narrowing proximal right vertebral artery. Mild narrowing proximal left vertebral artery. Moderate narrowing left vertebral artery C3-4 level.  Skeleton: Cervical spondylotic changes most notable C4-5 thru C5-6.  Other neck: No worrisome primary neck mass or lung apical lesion.  CTA HEAD  Anterior circulation: Internal carotid artery cavernous segment calcification with mild narrowing bilaterally. Anterior circulation without medium or large size vessel significant stenosis or occlusion.  Posterior circulation: Right vertebral artery is dominant and mildly ectatic. Narrowing distal left vertebral artery. Ectatic basilar artery with mild narrowing but without high-grade stenosis. No significant stenosis of the proximal posterior cerebral arteries. Fetal type contribution on right. Narrowing distal aspect of the posterior cerebral arteries bilaterally.  Venous sinuses: Negative  Anatomic variants: Negative  Delayed phase: Negative  IMPRESSION: CT HEAD  Left thalamic, left hippocampus and right pontine/cerebellar infarct.  Opacification sphenoid sinuses greater on the right.  CTA NECK  Ectatic 3 vessel aortic arch with plaque.  Plaque right carotid bifurcation with 52% diameter stenosis proximal right internal carotid artery.  No significant stenosis left carotid bifurcation or proximal left internal carotid artery.  Plaque with mild narrowing proximal right vertebral artery.  Mild narrowing proximal left vertebral artery. Moderate narrowing left vertebral artery C3-4 level.  CTA HEAD  Internal carotid artery cavernous segment calcification with mild narrowing bilaterally.  Anterior circulation without medium or large size vessel significant stenosis or occlusion.   Right vertebral artery is dominant and mildly ectatic.  Narrowing distal left vertebral artery.  Ectatic basilar artery with mild narrowing but without high-grade stenosis.  No significant stenosis of the proximal  posterior cerebral arteries. Fetal type contribution on right. Narrowing distal aspect of the posterior cerebral arteries bilaterally.   Electronically Signed   By: Genia Del M.D.   On: 04/07/2015 15:36   Ct Angio Neck W/cm &/or Wo/cm  04/07/2015   CLINICAL DATA:  81 year old female with acute intracranial infarcts. Presented with nausea, vomiting and dizziness. Subsequent encounter.  EXAM: CT ANGIOGRAPHY HEAD AND NECK  TECHNIQUE: Multidetector CT imaging of the head and neck was performed using the standard protocol during bolus administration of intravenous contrast. Multiplanar CT image reconstructions and MIPs were obtained to evaluate the vascular anatomy. Carotid stenosis measurements (when applicable) are obtained utilizing NASCET criteria, using the distal internal carotid diameter as the denominator.  CONTRAST:  39mL OMNIPAQUE IOHEXOL 350 MG/ML SOLN  COMPARISON:  04/06/2015 brain MR.  FINDINGS: CT HEAD  Brain: Left thalamic, left hippocampus and right pontine/cerebellar infarct. No intracranial hemorrhage. Global atrophy without hydrocephalus. No intracranial enhancing lesion.  Calvarium and skull base: Negative.  Paranasal sinuses: Opacification sphenoid sinuses greater on the right.  Orbits: Post lens replacement.  CTA NECK  Aortic arch: Ectatic 3 vessel aortic arch with plaque.  Right carotid system: Ectatic common carotid artery. Plaque right carotid bifurcation with 52% diameter stenosis proximal right internal carotid artery. Ectatic cervical segment.  Left carotid system: Ectatic common carotid artery. No significant stenosis left carotid bifurcation or proximal left internal carotid artery. Ectatic cervical segment.  Vertebral arteries:Plaque with mild narrowing proximal right  vertebral artery. Mild narrowing proximal left vertebral artery. Moderate narrowing left vertebral artery C3-4 level.  Skeleton: Cervical spondylotic changes most notable C4-5 thru C5-6.  Other neck: No worrisome primary neck mass or lung apical lesion.  CTA HEAD  Anterior circulation: Internal carotid artery cavernous segment calcification with mild narrowing bilaterally. Anterior circulation without medium or large size vessel significant stenosis or occlusion.  Posterior circulation: Right vertebral artery is dominant and mildly ectatic. Narrowing distal left vertebral artery. Ectatic basilar artery with mild narrowing but without high-grade stenosis. No significant stenosis of the proximal posterior cerebral arteries. Fetal type contribution on right. Narrowing distal aspect of the posterior cerebral arteries bilaterally.  Venous sinuses: Negative  Anatomic variants: Negative  Delayed phase: Negative  IMPRESSION: CT HEAD  Left thalamic, left hippocampus and right pontine/cerebellar infarct.  Opacification sphenoid sinuses greater on the right.  CTA NECK  Ectatic 3 vessel aortic arch with plaque.  Plaque right carotid bifurcation with 52% diameter stenosis proximal right internal carotid artery.  No significant stenosis left carotid bifurcation or proximal left internal carotid artery.  Plaque with mild narrowing proximal right vertebral artery.  Mild narrowing proximal left vertebral artery. Moderate narrowing left vertebral artery C3-4 level.  CTA HEAD  Internal carotid artery cavernous segment calcification with mild narrowing bilaterally.  Anterior circulation without medium or large size vessel significant stenosis or occlusion.  Right vertebral artery is dominant and mildly ectatic.  Narrowing distal left vertebral artery.  Ectatic basilar artery with mild narrowing but without high-grade stenosis.  No significant stenosis of the proximal posterior cerebral arteries. Fetal type contribution on right.  Narrowing distal aspect of the posterior cerebral arteries bilaterally.   Electronically Signed   By: Genia Del M.D.   On: 04/07/2015 15:36       Medical Problem List and Plan: 1. Functional deficits secondary to bilateral hemorrhagic nonhemorrhagic infarct suspect embolic 2.  DVT Prophylaxis/Anticoagulation: SCDs. Monitor for any signs of DVT 3. Pain Management: Tylenol as needed 4. Steroid-dependent asthma. Continue prednisone as  well as Dulera 5. Neuropsych: This patient is capable of making decisions on her own behalf. 6. Skin/Wound Care: Routine skin checks 7. Fluids/Electrolytes/Nutrition: Routine I&O with follow-up chemistries 8. Hyperlipidemia. Pravachol     Post Admission Physician Evaluation: 1. Functional deficits secondary  to bilateral hemorrhagic nonhemorrhagic infarct suspect embolic. 2. Patient is admitted to receive collaborative, interdisciplinary care between the physiatrist, rehab nursing staff, and therapy team. 3. Patient's level of medical complexity and substantial therapy needs in context of that medical necessity cannot be provided at a lesser intensity of care such as a SNF. 4. Patient has experienced substantial functional loss from his/her baseline which was documented above under the "Functional History" and "Functional Status" headings.  Judging by the patient's diagnosis, physical exam, and functional history, the patient has potential for functional progress which will result in measurable gains while on inpatient rehab.  These gains will be of substantial and practical use upon discharge  in facilitating mobility and self-care at the household level. 5. Physiatrist will provide 24 hour management of medical needs as well as oversight of the therapy plan/treatment and provide guidance as appropriate regarding the interaction of the two. 6. 24 hour rehab nursing will assist with bowel management, safety, disease management, medication administration and  patient education  and help integrate therapy concepts, techniques,education, etc. 7. PT will assess and treat for/with: Lower extremity strength, range of motion, stamina, balance, functional mobility, safety, adaptive techniques and equipment, focusing on LLE. Goals are: Supervision to Mod I. 8. OT will assess and treat for/with: ADL's, functional mobility, safety, upper extremity strength, adaptive techniques and equipment, focusing on ataxia. Goals are: Superivsion to Mod I. Therapy may proceed with showering this patient. 9. Case Management and Social Worker will assess and treat for psychological issues and discharge planning. 10. Team conference will be held weekly to assess progress toward goals and to determine barriers to discharge. 11. Patient will receive at least 3 hours of therapy per day at least 5 days per week. 12. ELOS: 13-17 days 13. Prognosis:  excellent     Delice Lesch, MD  04/09/2015

## 2015-04-09 NOTE — Progress Notes (Signed)
Patient complained of not having had a bowel movement x 2 days. Has no meds ordered. MD paged. Awaiting reply. Patient offered prune juice but refused.

## 2015-04-09 NOTE — Progress Notes (Signed)
I met with pt and her spouse at bedside. We discussed an inpt rehab admission pending her insurance approval. She has [employer] which may be an HMO not supplement. I will clarify coverage and seek insurance approval to admit pt to inpt rehab pending approval. Both pt and her spouse are in agreement. I will update RNCM and SW. [phone]

## 2015-04-09 NOTE — Progress Notes (Signed)
Physical Therapy Treatment Patient Details Name: Darlene Murray MRN: [id number] DOB: 04-30-1922 Today's Date: 04/09/2015    History of Present Illness Darlene Murray is a 81 y.o. female with Past medical history of blindness, asthma, dyslipidemia. The patient is presenting with progressive generalized weakness. MRI revealed + CVA.    PT Comments    Pt with improved ambulation tolerance this date however required maximal encouragement to increased amb distance. Pt con't to demo delayed processing, overall weakness L LE > R LE, decreased walkers safety and increased falls risk. con't to recommend cir upon d/c.  Follow Up Recommendations  CIR     Equipment Recommendations       Recommendations for Other Services Rehab consult     Precautions / Restrictions Precautions Precautions: Fall Restrictions Weight Bearing Restrictions: No    Mobility  Bed Mobility Overal bed mobility: Needs Assistance Bed Mobility: Rolling;Sidelying to Sit Rolling: Min assist Sidelying to sit: Mod assist       General bed mobility comments: max tactile cues to complete task, assist to elevate trunk  Transfers Overall transfer level: Needs assistance Equipment used: Rolling walker (2 wheeled) Transfers: Sit to/from Stand Sit to Stand: Min assist         General transfer comment: verbal and tactile cues to push up from bed, increased time  Ambulation/Gait Ambulation/Gait assistance: Mod assist Ambulation Distance (Feet): 50 Feet Assistive device: Rolling walker (2 wheeled) Gait Pattern/deviations: Step-through pattern;Decreased stride length;Shuffle Gait velocity: slow Gait velocity interpretation: <1.8 ft/sec, indicative of risk for recurrent falls General Gait Details: max encouragement to participate, noted decreased L LE step height. max assist for safe walker management due to pt trying to push out to far in front   Stairs            Wheelchair Mobility    Modified  Rankin (Stroke Patients Only) Modified Rankin (Stroke Patients Only) Pre-Morbid Rankin Score: No significant disability Modified Rankin: Moderately severe disability     Balance Overall balance assessment: Needs assistance Sitting-balance support: Feet supported;No upper extremity supported Sitting balance-Leahy Scale: Fair     Standing balance support: Bilateral upper extremity supported Standing balance-Leahy Scale: Poor                      Cognition Arousal/Alertness: Awake/alert Behavior During Therapy: Flat affect Overall Cognitive Status: Impaired/Different from baseline Area of Impairment: Safety/judgement         Safety/Judgement: Decreased awareness of deficits;Decreased awareness of safety     General Comments: pt with noted delayed processing and possibly comprehension    Exercises      General Comments        Pertinent Vitals/Pain Pain Assessment: No/denies pain    Home Living                      Prior Function            PT Goals (current goals can now be found in the care plan section) Acute Rehab PT Goals Patient Stated Goal: home Progress towards PT goals: Progressing toward goals    Frequency  Min 4X/week    PT Plan Current plan remains appropriate    Co-evaluation             End of Session Equipment Utilized During Treatment: Gait belt Activity Tolerance: Patient limited by fatigue Patient left: in chair;with call bell/phone within reach;with family/visitor present     Time: 4142-3953 PT Time Calculation (min) (ACUTE ONLY):  14 min  Charges:  $Gait Training: 8-22 mins                    G Codes:      Darlene Murray 04/09/2015, 3:48 PM  Darlene Murray, PT, DPT Pager #: [phone] Office #: [phone]

## 2015-04-10 ENCOUNTER — Inpatient Hospital Stay (HOSPITAL_COMMUNITY)
Admission: AD | Admit: 2015-04-10 | Discharge: 2015-04-20 | DRG: 057 | Disposition: A | Discharge status: Home Health | Payer: Medicare Other | Source: Intra-hospital | Attending: Physical Medicine & Rehabilitation | Admitting: Physical Medicine & Rehabilitation

## 2015-04-10 DIAGNOSIS — B952 Enterococcus as the cause of diseases classified elsewhere: Secondary | ICD-10-CM | POA: Diagnosis not present

## 2015-04-10 DIAGNOSIS — Z7952 Long term (current) use of systemic steroids: Secondary | ICD-10-CM | POA: Diagnosis not present

## 2015-04-10 DIAGNOSIS — R339 Retention of urine, unspecified: Secondary | ICD-10-CM

## 2015-04-10 DIAGNOSIS — J45909 Unspecified asthma, uncomplicated: Secondary | ICD-10-CM

## 2015-04-10 DIAGNOSIS — R270 Ataxia, unspecified: Secondary | ICD-10-CM

## 2015-04-10 DIAGNOSIS — Z79899 Other long term (current) drug therapy: Secondary | ICD-10-CM

## 2015-04-10 DIAGNOSIS — H540 Blindness, both eyes: Secondary | ICD-10-CM | POA: Diagnosis not present

## 2015-04-10 DIAGNOSIS — I69393 Ataxia following cerebral infarction: Secondary | ICD-10-CM

## 2015-04-10 DIAGNOSIS — I872 Venous insufficiency (chronic) (peripheral): Secondary | ICD-10-CM

## 2015-04-10 DIAGNOSIS — N390 Urinary tract infection, site not specified: Secondary | ICD-10-CM

## 2015-04-10 DIAGNOSIS — J4520 Mild intermittent asthma, uncomplicated: Secondary | ICD-10-CM | POA: Diagnosis not present

## 2015-04-10 DIAGNOSIS — K5900 Constipation, unspecified: Secondary | ICD-10-CM | POA: Diagnosis not present

## 2015-04-10 DIAGNOSIS — I639 Cerebral infarction, unspecified: Secondary | ICD-10-CM

## 2015-04-10 DIAGNOSIS — Z7951 Long term (current) use of inhaled steroids: Secondary | ICD-10-CM | POA: Diagnosis not present

## 2015-04-10 DIAGNOSIS — E8809 Other disorders of plasma-protein metabolism, not elsewhere classified: Secondary | ICD-10-CM | POA: Diagnosis not present

## 2015-04-10 DIAGNOSIS — E785 Hyperlipidemia, unspecified: Secondary | ICD-10-CM | POA: Diagnosis not present

## 2015-04-10 DIAGNOSIS — I6389 Other cerebral infarction: Secondary | ICD-10-CM | POA: Diagnosis present

## 2015-04-10 DIAGNOSIS — K5901 Slow transit constipation: Secondary | ICD-10-CM | POA: Diagnosis not present

## 2015-04-10 DIAGNOSIS — I69328 Other speech and language deficits following cerebral infarction: Principal | ICD-10-CM

## 2015-04-10 DIAGNOSIS — R338 Other retention of urine: Secondary | ICD-10-CM | POA: Diagnosis not present

## 2015-04-10 DIAGNOSIS — I638 Other cerebral infarction: Secondary | ICD-10-CM | POA: Diagnosis not present

## 2015-04-10 LAB — COMPREHENSIVE METABOLIC PANEL
ALT: 15 U/L (ref 14–54)
ANION GAP: 8 (ref 5–15)
AST: 26 U/L (ref 15–41)
Albumin: 3 g/dL — ABNORMAL LOW (ref 3.5–5.0)
Alkaline Phosphatase: 52 U/L (ref 38–126)
BUN: 15 mg/dL (ref 6–20)
CHLORIDE: 104 mmol/L (ref 101–111)
CO2: 27 mmol/L (ref 22–32)
Calcium: 9.3 mg/dL (ref 8.9–10.3)
Creatinine, Ser: 1.06 mg/dL — ABNORMAL HIGH (ref 0.44–1.00)
GFR, EST AFRICAN AMERICAN: 52 mL/min — AB (ref >60)
GFR, EST NON AFRICAN AMERICAN: 44 mL/min — AB (ref >60)
Glucose, Bld: 127 mg/dL — ABNORMAL HIGH (ref 65–99)
Potassium: 4.4 mmol/L (ref 3.5–5.1)
Sodium: 139 mmol/L (ref 135–145)
Total Bilirubin: 1.2 mg/dL (ref 0.3–1.2)
Total Protein: 6.5 g/dL (ref 6.5–8.1)

## 2015-04-10 LAB — CBC WITH DIFFERENTIAL/PLATELET
Basophils Absolute: 0 10*3/uL (ref 0.0–0.1)
Basophils Relative: 0 %
Eosinophils Absolute: 0.4 10*3/uL (ref 0.0–0.7)
Eosinophils Relative: 5 %
HCT: 42.2 % (ref 36.0–46.0)
Hemoglobin: 13.9 g/dL (ref 12.0–15.0)
Lymphocytes Relative: 14 %
Lymphs Abs: 1.1 10*3/uL (ref 0.7–4.0)
MCH: 29.1 pg (ref 26.0–34.0)
MCHC: 32.9 g/dL (ref 30.0–36.0)
MCV: 88.5 fL (ref 78.0–100.0)
Monocytes Absolute: 0.8 10*3/uL (ref 0.1–1.0)
Monocytes Relative: 10 %
Neutro Abs: 5.4 10*3/uL (ref 1.7–7.7)
Neutrophils Relative %: 71 %
Platelets: 250 10*3/uL (ref 150–400)
RBC: 4.77 MIL/uL (ref 3.87–5.11)
RDW: 15.4 % (ref 11.5–15.5)
WBC: 7.6 10*3/uL (ref 4.0–10.5)

## 2015-04-10 LAB — GLUCOSE, CAPILLARY: Glucose-Capillary: 112 mg/dL — ABNORMAL HIGH (ref 65–99)

## 2015-04-10 MED ORDER — ONDANSETRON HCL 4 MG/2ML IJ SOLN: 4.0000 mg | Freq: Four times a day (QID) | INTRAMUSCULAR | Status: DC | PRN

## 2015-04-10 MED ORDER — ACETAMINOPHEN 650 MG RE SUPP: 650.0000 mg | Freq: Four times a day (QID) | RECTAL | Status: DC | PRN

## 2015-04-10 MED ORDER — ACETAMINOPHEN 325 MG PO TABS: 650.0000 mg | ORAL_TABLET | Freq: Four times a day (QID) | ORAL | Status: DC | PRN

## 2015-04-10 MED ORDER — CALCIUM CARBONATE 1250 (500 CA) MG PO TABS
500.0000 mg | ORAL_TABLET | Freq: Every day | ORAL | Status: DC
  Administered 2015-04-10 – 2015-04-20 (×11): 500 mg via ORAL
  Filled 2015-04-10 (×11): qty 1

## 2015-04-10 MED ORDER — PREDNISONE 10 MG PO TABS
5.0000 mg | ORAL_TABLET | Freq: Every day | ORAL | Status: DC
  Administered 2015-04-10 – 2015-04-20 (×11): 5 mg via ORAL
  Filled 2015-04-10 (×11): qty 1

## 2015-04-10 MED ORDER — IPRATROPIUM-ALBUTEROL 0.5-2.5 (3) MG/3ML IN SOLN
3.0000 mL | Freq: Two times a day (BID) | RESPIRATORY_TRACT | Status: DC
  Administered 2015-04-10 – 2015-04-12 (×4): 3 mL via RESPIRATORY_TRACT
  Filled 2015-04-10 (×4): qty 3

## 2015-04-10 MED ORDER — ONDANSETRON HCL 4 MG PO TABS: 4.0000 mg | ORAL_TABLET | Freq: Four times a day (QID) | ORAL | Status: DC | PRN

## 2015-04-10 MED ORDER — TRAZODONE HCL 50 MG PO TABS: 50.0000 mg | ORAL_TABLET | Freq: Every evening | ORAL | Status: DC | PRN

## 2015-04-10 MED ORDER — ASPIRIN EC 325 MG PO TBEC
325.0000 mg | DELAYED_RELEASE_TABLET | Freq: Every day | ORAL | Status: DC
  Administered 2015-04-10 – 2015-04-20 (×11): 325 mg via ORAL
  Filled 2015-04-10 (×11): qty 1

## 2015-04-10 MED ORDER — ALBUTEROL SULFATE (2.5 MG/3ML) 0.083% IN NEBU: 2.5000 mg | INHALATION_SOLUTION | RESPIRATORY_TRACT | Status: DC | PRN

## 2015-04-10 MED ORDER — PRAVASTATIN SODIUM 20 MG PO TABS
20.0000 mg | ORAL_TABLET | Freq: Every day | ORAL | Status: DC
  Administered 2015-04-10 – 2015-04-19 (×10): 20 mg via ORAL
  Filled 2015-04-10 (×10): qty 1

## 2015-04-10 MED ORDER — MOMETASONE FURO-FORMOTEROL FUM 100-5 MCG/ACT IN AERO
2.0000 | INHALATION_SPRAY | Freq: Two times a day (BID) | RESPIRATORY_TRACT | Status: DC
  Administered 2015-04-10 – 2015-04-19 (×16): 2 via RESPIRATORY_TRACT
  Filled 2015-04-10: qty 8.8

## 2015-04-10 MED ORDER — SORBITOL 70 % SOLN
30.0000 mL | Freq: Every day | Status: DC | PRN
  Administered 2015-04-10 – 2015-04-14 (×2): 30 mL via ORAL
  Filled 2015-04-10 (×2): qty 30

## 2015-04-10 NOTE — Progress Notes (Signed)
Physical Medicine and Rehabilitation Consult Reason for Consult: Bilateral hemorrhagic and nonhemorrhagic infarcts felt to be embolic Referring Physician: [facility]   HPI: Darlene Murray is a 81 y.o. Darlene Murray is a 81 y.o. right handed female with history of steroid-dependent for asthma and hyperlipidemia. Patient lives with spouse and was independent prior to admission. Spouse is retired and able to care for pt on discharge. Presented 04/06/2015 with nausea and vomiting, dizziness, decrease in balance and generalized weakness as well as some mild slurred speech. MRI imaging revealed acute moderate sized nonhemorrhagic infarct posterior right upper and mid pons and adjacent right cerebellum. Acute tiny nonhemorrhagic infarct left thalamic infarct. Acute nonhemorrhagic left occipital lobe infarct. CTA of head and neck with no significant stenosis or occlusion. Echocardiogram results are pending. Patient did not receive TPA. Neurology consulted maintain on aspirin 325 mg daily for CVA prophylaxis. Patient continues on low dose prednisone for history of asthma. Tolerating a regular consistency diet. Therapies have been initiated. M.D. has requested physical medicine rehabilitation consult   Review of Systems  Constitutional: Negative for fever and chills.  HENT: Negative for hearing loss.  Eyes: Negative for pain.  Respiratory: Negative for cough.   Shortness of breath on exertion  Cardiovascular: Positive for palpitations. Negative for chest pain and leg swelling.  Gastrointestinal: Positive for constipation. Negative for nausea and vomiting.  Genitourinary: Negative for dysuria and hematuria.  Musculoskeletal: Positive for myalgias and joint pain.  Skin: Negative for rash.  Neurological: Positive for weakness. Negative for seizures, loss of consciousness and headaches.  All other systems reviewed and are negative.  Past Medical History  Diagnosis Date  . Blindness     . Asthmatic bronchitis   . Asthma   . Fibrocystic breast disease   . Allergic rhinitis   . Hyperlipidemia   . Venous insufficiency     lower extremities  . HNP (herniated nucleus pulposus), lumbar     L4-L5  . Left ventricular hypertrophy 2008    mild  . Antibiotic-associated diarrhea    History reviewed. No pertinent past surgical history. Family History  Problem Relation Age of Onset  . Heart attack Sister   . Alzheimer's disease Sister   . Leukemia Mother   . Asthma Son    Social History:  reports that she has never smoked. She has never used smokeless tobacco. She reports that she drinks alcohol. She reports that she does not use illicit drugs. Allergies: No Known Allergies Medications Prior to Admission  Medication Sig Dispense Refill  . albuterol (PROVENTIL HFA;VENTOLIN HFA) 108 (90 BASE) MCG/ACT inhaler Inhale 2 puffs into the lungs every 6 (six) hours as needed for wheezing or shortness of breath. 1 Inhaler 6  . Fluticasone Furoate-Vilanterol (BREO ELLIPTA) 200-25 MCG/INH AEPB Inhale 1 puff into the lungs daily. 60 each 6  . calcium carbonate (OS-CAL) 600 MG TABS Take 600 mg by mouth daily.     . Multiple Vitamin (MULTIVITAMIN) tablet Take 1 tablet by mouth daily.    . predniSONE (DELTASONE) 5 MG tablet Take 1 tablet (5 mg total) by mouth daily. (Patient not taking: Reported on 04/06/2015) 30 tablet 5  . Pyridoxine HCl (VITAMIN B-6 PO) Take 1 tablet by mouth daily.    . traZODone (DESYREL) 50 MG tablet Take 50 mg by mouth at bedtime as needed.       Home: Home Living Family/patient expects to be discharged to:: Private residence Living Arrangements: Spouse/significant other Available Help at Discharge: Family, Available 24 hours/day  Type of Home: House Home Access: Level entry Home Layout: One level Bathroom Shower/Tub: Multimedia programmer: Handicapped  height Home Equipment: Environmental consultant - 2 wheels, Gila - single point, Grab bars - tub/shower Lives With: Spouse  Functional History: Prior Function Level of Independence: Independent Functional Status:  Mobility: Bed Mobility Overal bed mobility: Needs Assistance Bed Mobility: Rolling, Sidelying to Sit Rolling: Min assist Sidelying to sit: Mod assist Supine to sit: Min assist General bed mobility comments: initially attempted supine to EOB with pt demostrating little initaition at trunk and BUE, cues for BLE advancement. Switched to logrolling technique with pt completing as detailed above. Cues for sequencing, assist for powerup. Transfers Overall transfer level: Needs assistance Equipment used: Rolling walker (2 wheeled) Transfers: Sit to/from Stand Sit to Stand: Min assist Stand pivot transfers: Mod assist General transfer comment: cues for hand placement. 2x from EOB with low bed height, 1x from recliner. Cues to scoot forward prior to standing with assist provided for scooting.  Ambulation/Gait Ambulation/Gait assistance: Mod assist Ambulation Distance (Feet): 10 Feet Assistive device: Rolling walker (2 wheeled) Gait Pattern/deviations: Step-through pattern, Decreased stride length, Ataxic, Wide base of support, Trunk flexed, Staggering left General Gait Details: Mod assist for balance and walker control especially with turns. Drifts left and requires cues for correction with facilitory assist at times. No buckling although very ataxic foot placement. Gait velocity: slow Gait velocity interpretation: <1.8 ft/sec, indicative of risk for recurrent falls    ADL: ADL Overall ADL's : Needs assistance/impaired Eating/Feeding: Set up, Bed level, Sitting Eating/Feeding Details (indicate cue type and reason): poor apetite Grooming: Wash/dry hands, Wash/dry face, Oral care, Brushing hair, Moderate assistance, Sitting Grooming Details (indicate cue type and reason): completed functional  mobility from EOB to sink towards goal of grooming standing at sink Upper Body Bathing: Minimal assitance, Sitting Lower Body Bathing: Maximal assistance, Sit to/from stand Upper Body Dressing : Moderate assistance, Sitting Lower Body Dressing: Maximal assistance, Sit to/from stand Toilet Transfer: Moderate assistance, Stand-pivot, BSC Toileting- Clothing Manipulation and Hygiene: Maximal assistance, Sit to/from stand Functional mobility during ADLs: Moderate assistance, Rolling walker General ADL Comments: Pt completed bed mobility as detailed below. Pt completed in-room functional mobility with mod A and chair follow. Pt ambulated to sink towards ADL grooming goal. Pt stating after ambulation that she felt tired, mostly in her BLE. Cues needed throughout session for hand placement, sequencing.   Cognition: Cognition Overall Cognitive Status: Impaired/Different from baseline Orientation Level: Oriented to person, Oriented to place, Oriented to time, Disoriented to situation Memory: Appears intact Awareness: Appears intact Problem Solving: Appears intact Safety/Judgment: Appears intact Cognition Arousal/Alertness: Awake/alert Behavior During Therapy: Flat affect Overall Cognitive Status: Impaired/Different from baseline Area of Impairment: Safety/judgement Safety/Judgement: Decreased awareness of deficits, Decreased awareness of safety General Comments: Pt stating she was not sure if she could complete supine>EOB. Stated that she had not walked since hospital admission, reminded her of walking with PT.  Blood pressure 132/69, pulse 73, temperature 97.8 F (36.6 C), temperature source Oral, resp. rate 20, height 5\' 2"  (1.575 m), weight 80.377 kg (177 lb 3.2 oz), SpO2 98 %. Physical Exam  Nursing note and vitals reviewed. Constitutional: She is oriented to person, place, and time. She appears well-developed and well-nourished.  HENT:  Head: Normocephalic and atraumatic.  Eyes:  Conjunctivae and EOM are normal.  Neck: Normal range of motion. Neck supple. No thyromegaly present.  Cardiovascular: Normal rate, regular rhythm and normal heart sounds.  Respiratory: Effort normal and breath sounds normal.  No respiratory distress.  GI: Soft. Bowel sounds are normal. She exhibits no distension.  Musculoskeletal:  RUE 4-/5 LUE 4+/5 LLE 3/5 (history of back pain) RLE 5/5  Neurological: She is alert and oriented to person, place, and time.  Ataxia RUE CN II-XII grossly intact Oriented to person place and date of birth. Follows simple commands. Fair awareness of deficits  Skin: Skin is warm and dry.  Psychiatric: She has a normal mood and affect. Her behavior is normal.     Lab Results Last 24 Hours    No results found for this or any previous visit (from the past 24 hour(s)).    Imaging Results (Last 48 hours)    Ct Angio Head W/cm &/or Wo Cm  04/07/2015 CLINICAL DATA: 81 year old female with acute intracranial infarcts. Presented with nausea, vomiting and dizziness. Subsequent encounter. EXAM: CT ANGIOGRAPHY HEAD AND NECK TECHNIQUE: Multidetector CT imaging of the head and neck was performed using the standard protocol during bolus administration of intravenous contrast. Multiplanar CT image reconstructions and MIPs were obtained to evaluate the vascular anatomy. Carotid stenosis measurements (when applicable) are obtained utilizing NASCET criteria, using the distal internal carotid diameter as the denominator. CONTRAST: 7mL OMNIPAQUE IOHEXOL 350 MG/ML SOLN COMPARISON: 04/06/2015 brain MR. FINDINGS: CT HEAD Brain: Left thalamic, left hippocampus and right pontine/cerebellar infarct. No intracranial hemorrhage. Global atrophy without hydrocephalus. No intracranial enhancing lesion. Calvarium and skull base: Negative. Paranasal sinuses: Opacification sphenoid sinuses greater on the right. Orbits: Post lens replacement. CTA NECK Aortic arch: Ectatic 3 vessel  aortic arch with plaque. Right carotid system: Ectatic common carotid artery. Plaque right carotid bifurcation with 52% diameter stenosis proximal right internal carotid artery. Ectatic cervical segment. Left carotid system: Ectatic common carotid artery. No significant stenosis left carotid bifurcation or proximal left internal carotid artery. Ectatic cervical segment. Vertebral arteries:Plaque with mild narrowing proximal right vertebral artery. Mild narrowing proximal left vertebral artery. Moderate narrowing left vertebral artery C3-4 level. Skeleton: Cervical spondylotic changes most notable C4-5 thru C5-6. Other neck: No worrisome primary neck mass or lung apical lesion. CTA HEAD Anterior circulation: Internal carotid artery cavernous segment calcification with mild narrowing bilaterally. Anterior circulation without medium or large size vessel significant stenosis or occlusion. Posterior circulation: Right vertebral artery is dominant and mildly ectatic. Narrowing distal left vertebral artery. Ectatic basilar artery with mild narrowing but without high-grade stenosis. No significant stenosis of the proximal posterior cerebral arteries. Fetal type contribution on right. Narrowing distal aspect of the posterior cerebral arteries bilaterally. Venous sinuses: Negative Anatomic variants: Negative Delayed phase: Negative IMPRESSION: CT HEAD Left thalamic, left hippocampus and right pontine/cerebellar infarct. Opacification sphenoid sinuses greater on the right. CTA NECK Ectatic 3 vessel aortic arch with plaque. Plaque right carotid bifurcation with 52% diameter stenosis proximal right internal carotid artery. No significant stenosis left carotid bifurcation or proximal left internal carotid artery. Plaque with mild narrowing proximal right vertebral artery. Mild narrowing proximal left vertebral artery. Moderate narrowing left vertebral artery C3-4 level. CTA HEAD Internal carotid artery  cavernous segment calcification with mild narrowing bilaterally. Anterior circulation without medium or large size vessel significant stenosis or occlusion. Right vertebral artery is dominant and mildly ectatic. Narrowing distal left vertebral artery. Ectatic basilar artery with mild narrowing but without high-grade stenosis. No significant stenosis of the proximal posterior cerebral arteries. Fetal type contribution on right. Narrowing distal aspect of the posterior cerebral arteries bilaterally. Electronically Signed By: Genia Del M.D. On: 04/07/2015 15:36   Ct Angio Neck W/cm &/or Wo/cm  04/07/2015 CLINICAL DATA: 81 year old female with acute intracranial infarcts. Presented with nausea, vomiting and dizziness. Subsequent encounter. EXAM: CT ANGIOGRAPHY HEAD AND NECK TECHNIQUE: Multidetector CT imaging of the head and neck was performed using the standard protocol during bolus administration of intravenous contrast. Multiplanar CT image reconstructions and MIPs were obtained to evaluate the vascular anatomy. Carotid stenosis measurements (when applicable) are obtained utilizing NASCET criteria, using the distal internal carotid diameter as the denominator. CONTRAST: 15mL OMNIPAQUE IOHEXOL 350 MG/ML SOLN COMPARISON: 04/06/2015 brain MR. FINDINGS: CT HEAD Brain: Left thalamic, left hippocampus and right pontine/cerebellar infarct. No intracranial hemorrhage. Global atrophy without hydrocephalus. No intracranial enhancing lesion. Calvarium and skull base: Negative. Paranasal sinuses: Opacification sphenoid sinuses greater on the right. Orbits: Post lens replacement. CTA NECK Aortic arch: Ectatic 3 vessel aortic arch with plaque. Right carotid system: Ectatic common carotid artery. Plaque right carotid bifurcation with 52% diameter stenosis proximal right internal carotid artery. Ectatic cervical segment. Left carotid system: Ectatic common carotid artery. No significant stenosis  left carotid bifurcation or proximal left internal carotid artery. Ectatic cervical segment. Vertebral arteries:Plaque with mild narrowing proximal right vertebral artery. Mild narrowing proximal left vertebral artery. Moderate narrowing left vertebral artery C3-4 level. Skeleton: Cervical spondylotic changes most notable C4-5 thru C5-6. Other neck: No worrisome primary neck mass or lung apical lesion. CTA HEAD Anterior circulation: Internal carotid artery cavernous segment calcification with mild narrowing bilaterally. Anterior circulation without medium or large size vessel significant stenosis or occlusion. Posterior circulation: Right vertebral artery is dominant and mildly ectatic. Narrowing distal left vertebral artery. Ectatic basilar artery with mild narrowing but without high-grade stenosis. No significant stenosis of the proximal posterior cerebral arteries. Fetal type contribution on right. Narrowing distal aspect of the posterior cerebral arteries bilaterally. Venous sinuses: Negative Anatomic variants: Negative Delayed phase: Negative IMPRESSION: CT HEAD Left thalamic, left hippocampus and right pontine/cerebellar infarct. Opacification sphenoid sinuses greater on the right. CTA NECK Ectatic 3 vessel aortic arch with plaque. Plaque right carotid bifurcation with 52% diameter stenosis proximal right internal carotid artery. No significant stenosis left carotid bifurcation or proximal left internal carotid artery. Plaque with mild narrowing proximal right vertebral artery. Mild narrowing proximal left vertebral artery. Moderate narrowing left vertebral artery C3-4 level. CTA HEAD Internal carotid artery cavernous segment calcification with mild narrowing bilaterally. Anterior circulation without medium or large size vessel significant stenosis or occlusion. Right vertebral artery is dominant and mildly ectatic. Narrowing distal left vertebral artery. Ectatic basilar artery with  mild narrowing but without high-grade stenosis. No significant stenosis of the proximal posterior cerebral arteries. Fetal type contribution on right. Narrowing distal aspect of the posterior cerebral arteries bilaterally. Electronically Signed By: Genia Del M.D. On: 04/07/2015 15:36     Assessment/Plan: Diagnosis: Bilateral hemorrhagic and nonhemorrhagic infarcts 1. Does the need for close, 24 hr/day medical supervision in concert with the patient's rehab needs make it unreasonable for this patient to be served in a less intensive setting? Yes 2. Co-Morbidities requiring supervision/potential complications: Asthma 3. Due to safety, disease management, medication administration and patient education, does the patient require 24 hr/day rehab nursing? Yes 4. Does the patient require coordinated care of a physician, rehab nurse, PT (1-2 hrs/day, 5 days/week) and OT (1-2 hrs/day, 5 days/week) to address physical and functional deficits in the context of the above medical diagnosis(es)? Yes Addressing deficits in the following areas: balance, endurance, locomotion, strength, transferring, bathing, dressing, feeding, grooming, toileting and psychosocial support 5. Can the patient actively participate in an intensive therapy program  of at least 3 hrs of therapy per day at least 5 days per week? Yes 6. The potential for patient to make measurable gains while on inpatient rehab is excellent 7. Anticipated functional outcomes upon discharge from inpatient rehab are supervision and min assist with PT, modified independent and supervision with OT, n/a with SLP. 8. Estimated rehab length of stay to reach the above functional goals is: 13-17 days 9. Does the patient have adequate social supports and living environment to accommodate these discharge functional goals? Yes 10. Anticipated D/C setting: Home 11. Anticipated post D/C treatments: HH therapy and Home excercise program 12. Overall  Rehab/Functional Prognosis: excellent  RECOMMENDATIONS: This patient's condition is appropriate for continued rehabilitative care in the following setting: CIR Patient has agreed to participate in recommended program. Yes Note that insurance prior authorization may be required for reimbursement for recommended care.  Comment: Rehab Admissions Coordinator to follow up.   04/09/2015       Revision History     Date/Time User Provider Type Action   04/09/2015 10:56 AM Ankit Lorie Phenix, MD Physician Sign   04/09/2015 6:20 AM Cathlyn Parsons, PA-C Physician Assistant Pend   View Details Report       Routing History     Date/Time From To Method   04/09/2015 10:56 AM Ankit Lorie Phenix, MD Ankit Lorie Phenix, MD [facility]   04/09/2015 10:56 AM Ankit Lorie Phenix, MD Hulan Fess, MD Fax

## 2015-04-10 NOTE — Progress Notes (Deleted)
PMR Admission Coordinator Pre-Admission Assessment  Patient: Darlene Murray is an 81 y.o., female MRN: [id number] DOB: [birth year] Height: 5\' 2"  (157.5 cm) Weight: 80.377 kg (177 lb 3.2 oz)  Insurance Information HMO: PPO: PCP: IPA: 80/20: yes OTHER: no HMO PRIMARY: Medicare a and b Policy#: [id number] Subscriber: pt Benefits: Phone #: online palmetto Name: 04/10/15 Eff. Date: 03/07/89 Deduct: $1288 Out of Pocket Max: none Life Max: none CIR: 100% SNF: 20 full days Outpatient: 80% Co-Pay: 20% Home Health: 100% Co-Pay: none DME: 80% Co-Pay: 20% Providers: pt choice  SECONDARY: [facility] Policy#: [id number] Subscriber: pt No auth required with medicare primary  Medicaid Application Date: Case Manager:  Disability Application Date: Case Worker:   Emergency Contact Information Contact Information    Name Relation Home Work Mobile   [place] Spouse [phone]  [phone]   [place] Son [phone]       Current Medical History  Patient Admitting Diagnosis: bilateral hemorrhagic and non hemorrhagic infarcts  History of Present Illness:Darlene Murray is a 81 y.o. Darlene Murray is a 81 y.o. right handed female with history of steroid-dependent for asthma and hyperlipidemia. Patient lives with spouse and was independent prior to admission. Spouse is retired and able to care for pt on discharge. Presented 04/06/2015 with nausea and vomiting, dizziness, decrease in balance and generalized weakness as well as some mild slurred speech. MRI imaging revealed acute moderate sized nonhemorrhagic infarct posterior right upper and mid pons and adjacent right cerebellum. Acute tiny nonhemorrhagic infarct left  thalamic infarct. Acute nonhemorrhagic left occipital lobe infarct. CTA of head and neck with no significant stenosis or occlusion. Echocardiogram results are pending. Patient did not receive TPA. Neurology consulted maintain on aspirin 325 mg daily for CVA prophylaxis. Patient continues on low dose prednisone for history of asthma. Tolerating a regular consistency diet.   Total: 0 NIH    Past Medical History  Past Medical History  Diagnosis Date  . Blindness   . Asthmatic bronchitis   . Asthma   . Fibrocystic breast disease   . Allergic rhinitis   . Hyperlipidemia   . Venous insufficiency     lower extremities  . HNP (herniated nucleus pulposus), lumbar     L4-L5  . Left ventricular hypertrophy 2008    mild  . Antibiotic-associated diarrhea     Family History  family history includes Alzheimer's disease in her sister; Asthma in her son; Heart attack in her sister; Leukemia in her mother.  Prior Rehab/Hospitalizations:  Has the patient had major surgery during 100 days prior to admission? No  Current Medications   Current facility-administered medications:  . acetaminophen (TYLENOL) tablet 650 mg, 650 mg, Oral, Q6H PRN **OR** acetaminophen (TYLENOL) suppository 650 mg, 650 mg, Rectal, Q6H PRN, Lavina Hamman, MD . albuterol (PROVENTIL) (2.5 MG/3ML) 0.083% nebulizer solution 2.5 mg, 2.5 mg, Nebulization, Q2H PRN, Jonetta Osgood, MD . aspirin EC tablet 325 mg, 325 mg, Oral, Daily, Greta Doom, MD, 325 mg at 04/10/15 0932 . calcium carbonate (OS-CAL - dosed in mg of elemental calcium) tablet 500 mg of elemental calcium, 500 mg of elemental calcium, Oral, Daily, Jonetta Osgood, MD, 500 mg of elemental calcium at 04/10/15 0932 . ipratropium-albuterol (DUONEB) 0.5-2.5 (3) MG/3ML nebulizer solution 3 mL, 3 mL, Nebulization, BID, Jonetta Osgood, MD, 3 mL at 04/10/15 0855 . mometasone-formoterol (DULERA) 100-5 MCG/ACT  inhaler 2 puff, 2 puff, Inhalation, BID, Lavina Hamman, MD, 2 puff at 04/10/15 0855 . ondansetron (ZOFRAN) tablet 4 mg,  4 mg, Oral, Q6H PRN **OR** ondansetron (ZOFRAN) injection 4 mg, 4 mg, Intravenous, Q6H PRN, Lavina Hamman, MD . pravastatin (PRAVACHOL) tablet 20 mg, 20 mg, Oral, q1800, David L Rinehuls, PA-C, 20 mg at 04/09/15 1858 . predniSONE (DELTASONE) tablet 5 mg, 5 mg, Oral, Daily, Lavina Hamman, MD, 5 mg at 04/10/15 0932 . traZODone (DESYREL) tablet 50 mg, 50 mg, Oral, QHS PRN, Jonetta Osgood, MD  Patients Current Diet: Diet regular Room service appropriate?: Yes; Fluid consistency:: Thin Diet - low sodium heart healthy  Precautions / Restrictions Precautions Precautions: Fall Restrictions Weight Bearing Restrictions: No   Has the patient had 2 or more falls or a fall with injury in the past year?No  Prior Activity Level Community (5-7x/wk): very active daily in the community; drives Pt very active in the community. [employer], numerous Training and development officer, Estate agent. In the community daily and drives. Does a lot of computer work. Does not use any AD pta. Hs two schools in [place]. named after her. 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 Invited to the white house for [employer].  Home Assistive Devices / Equipment Home Assistive Devices/Equipment: None Home Equipment: Environmental consultant - 2 wheels, [place] - single point, Grab bars - tub/shower  Prior Device Use: Indicate devices/aids used by the patient prior to current illness, exacerbation or injury? None of the above  Prior Functional Level Prior Function Level of Independence: Independent Comments: drives, active on numerous organizations  Self Care: Did the patient need help bathing, dressing, using the toilet or eating? Independent  Indoor Mobility: Did the patient need assistance with walking from room to room (with or without device)?  Independent  Stairs: Did the patient need assistance with internal or external stairs (with or without device)? Independent  Functional Cognition: Did the patient need help planning regular tasks such as shopping or remembering to take medications? Independent  Current Functional Level Cognition  Overall Cognitive Status: Impaired/Different from baseline Orientation Level: Oriented X4 Safety/Judgement: Decreased awareness of deficits, Decreased awareness of safety General Comments: pt with noted delayed processing and possibly comprehension Memory: Appears intact Awareness: Appears intact Problem Solving: Appears intact Safety/Judgment: Appears intact   Extremity Assessment (includes Sensation/Coordination)  Upper Extremity Assessment: RUE deficits/detail, LUE deficits/detail RUE Deficits / Details: strength grossly 4/5-4+/5. Dysmetria noted RUE Coordination: decreased fine motor, decreased gross motor LUE Deficits / Details: mild dysmetria noted   Lower Extremity Assessment: Defer to PT evaluation    ADLs  Overall ADL's : Needs assistance/impaired Eating/Feeding: Set up, Bed level, Sitting Eating/Feeding Details (indicate cue type and reason): poor apetite Grooming: Wash/dry hands, Wash/dry face, Oral care, Brushing hair, Moderate assistance, Sitting Grooming Details (indicate cue type and reason): completed functional mobility from EOB to sink towards goal of grooming standing at sink Upper Body Bathing: Minimal assitance, Sitting Lower Body Bathing: Maximal assistance, Sit to/from stand Upper Body Dressing : Moderate assistance, Sitting Lower Body Dressing: Maximal assistance, Sit to/from stand Toilet Transfer: Moderate assistance, Stand-pivot, BSC Toileting- Clothing Manipulation and Hygiene: Maximal assistance, Sit to/from stand Functional mobility during ADLs: Moderate assistance, Rolling walker General ADL Comments: Pt completed bed mobility as detailed below. Pt  completed in-room functional mobility with mod A and chair follow. Pt ambulated to sink towards ADL grooming goal. Pt stating after ambulation that she felt tired, mostly in her BLE. Cues needed throughout session for hand placement, sequencing.     Mobility  Overal bed mobility: Needs Assistance Bed  Mobility: Rolling, Sidelying to Sit Rolling: Min assist Sidelying to sit: Mod assist Supine to sit: Min assist General bed mobility comments: max tactile cues to complete task, assist to elevate trunk    Transfers  Overall transfer level: Needs assistance Equipment used: Rolling walker (2 wheeled) Transfers: Sit to/from Stand Sit to Stand: Min assist Stand pivot transfers: Mod assist General transfer comment: verbal and tactile cues to push up from bed, increased time    Ambulation / Gait / Stairs / Wheelchair Mobility  Ambulation/Gait Ambulation/Gait assistance: Mod assist Ambulation Distance (Feet): 50 Feet Assistive device: Rolling walker (2 wheeled) Gait Pattern/deviations: Step-through pattern, Decreased stride length, Shuffle General Gait Details: max encouragement to participate, noted decreased L LE step height. max assist for safe walker management due to pt trying to push out to far in front Gait velocity: slow Gait velocity interpretation: <1.8 ft/sec, indicative of risk for recurrent falls    Posture / Balance Dynamic Sitting Balance Sitting balance - Comments: sat EOB talking on phone with no LOB or swaying Balance Overall balance assessment: Needs assistance Sitting-balance support: Feet supported, No upper extremity supported Sitting balance-Leahy Scale: Fair Sitting balance - Comments: sat EOB talking on phone with no LOB or swaying Standing balance support: Bilateral upper extremity supported Standing balance-Leahy Scale: Poor Standing balance comment: mod A and rw    Special needs/care consideration  Skin intact Bowel mgmt: continent LBM  10/1 Bladder mgmt: continent Diabetic mgmt no   Previous Home Environment Living Arrangements: Spouse/significant other Lives With: Spouse Available Help at Discharge: Family, Available 24 hours/day Type of Home: House Home Layout: One level Home Access: Level entry Bathroom Shower/Tub: Multimedia programmer: Handicapped height Bathroom Accessibility: Yes How Accessible: Accessible via walker Home Care Services: No  Discharge Living Setting Plans for Discharge Living Setting: Patient's home, Lives with (comment) (spouse of 10 years) Type of Home at Discharge: House Discharge Home Layout: One level Discharge Home Access: Level entry Discharge Bathroom Shower/Tub: Walk-in shower, Other (comment) (has two built in seats in shower) Discharge Bathroom Toilet: Handicapped height Discharge Bathroom Accessibility: Yes How Accessible: Accessible via walker Does the patient have any problems obtaining your medications?: No  Social/Family/Support Systems Patient Roles: Spouse, Parent, Psychologist, occupational (active with church, on numerous boards, 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 Information: spouse and local son Anticipated Caregiver: spouse and local son Anticipated Ambulance person Information: see above Ability/Limitations of Caregiver: no limitations Caregiver Availability: 24/7 Discharge Plan Discussed with Primary Caregiver: Yes Is Caregiver In Agreement with Plan?: Yes Does Caregiver/Family have Issues with Lodging/Transportation while Pt is in Rehab?: No  Goals/Additional Needs Patient/Family Goal for Rehab: supervision with PT, OT, and SLP Expected length of stay: [facility] 13-17 days Additional Information: pt does not want any visitors besides her son and spouse at this time Pt/Family Agrees to Admission and willing to participate: Yes Program Orientation Provided & Reviewed with Pt/Caregiver Including Roles & Responsibilities: Yes  Decrease burden of Care through IP rehab  admission: n/a  Possible need for SNF placement upon discharge:not anticipated  Patient Condition: This patient's condition remains as documented in the consult dated 04/09/15, in which the Rehabilitation Physician determined and documented that the patient's condition is appropriate for intensive rehabilitative care in an inpatient rehabilitation facility. Will admit to inpatient rehab today.  Preadmission Screen Completed By: Cleatrice Burke, 04/10/2015 10:50 AM ______________________________________________________________________  Discussed status with Dr. Posey Pronto on 04/10/2015 at 63 and received telephone approval for admission today.  Admission Coordinator: Cleatrice Burke, time  1050 Date 04/10/2015.          Cosigned by: Ankit Lorie Phenix, MD at 04/10/2015 11:00 AM  Revision History     Date/Time User Provider Type Action   04/10/2015 11:00 AM Ankit Lorie Phenix, MD Physician Cosign   04/10/2015 10:57 AM Cristina Gong, RN Rehab Admission Coordinator Sign   04/10/2015 10:57 AM Cristina Gong, RN Rehab Admission Coordinator Sign   04/10/2015 10:56 AM Cristina Gong, RN Rehab Admission Coordinator Sign   04/10/2015 10:51 AM Cristina Gong, RN Rehab Admission Coordinator Sign   View Details Report

## 2015-04-10 NOTE — Interval H&P Note (Signed)
Darlene Murray was admitted today to Inpatient Rehabilitation with the diagnosis of Bilateral hemorrhagic and nonhemorrhagic infarcts.  The patient's history has been reviewed, patient examined, and there is no change in status.  Patient continues to be appropriate for intensive inpatient rehabilitation.  I have reviewed the patient's chart and labs.  Questions were answered to the patient's satisfaction. The PAPE has been reviewed and assessment remains appropriate.  Allea Kassner Lorie Phenix 04/10/2015, 4:37 PM

## 2015-04-10 NOTE — Care Management Important Message (Signed)
Important Message  Patient Details  Name: Darlene Murray MRN: [id number] Date of Birth: 1922/06/03   Medicare Important Message Given:  Yes-second notification given    Delorse Lek 04/10/2015, 11:32 AM

## 2015-04-10 NOTE — H&P (View-Only) (Signed)
Physical Medicine and Rehabilitation Admission H&P    Chief Complaint  Patient presents with  . Emesis  . Nausea  . Fatigue  : HPI: Darlene Murray is a 81 y.o. Darlene Murray is a 81 y.o. right handed female with history of steroid-dependent for asthma and hyperlipidemia. Patient lives with spouse and was independent prior to admission. Spouse is retired and able to care for pt on discharge. Presented 04/06/2015 with nausea and vomiting, dizziness, decrease in balance and generalized weakness as well as some mild slurred speech. MRI imaging revealed acute moderate sized nonhemorrhagic infarct posterior right upper and mid pons and adjacent right cerebellum. Acute tiny nonhemorrhagic infarct left thalamic infarct. Acute nonhemorrhagic left occipital lobe infarct. CTA of head and neck with no significant stenosis or occlusion. Echocardiogram results are pending. Patient did not receive TPA. Neurology consulted maintain on aspirin 325 mg daily for CVA prophylaxis. Patient continues on low dose prednisone for history of asthma. Tolerating a regular consistency diet. Therapies have been initiated. M.D. has requested physical medicine rehabilitation consult. Patient was admitted for comprehensive rehabilitation program  ROS Review of Systems  Constitutional: Negative for fever and chills.  HENT: Negative for hearing loss.  Eyes: Negative for pain.  Respiratory: Negative for cough.   Shortness of breath on exertion  Cardiovascular: Positive for palpitations. Negative for chest pain and leg swelling.  Gastrointestinal: Positive for constipation. Negative for nausea and vomiting.  Genitourinary: Negative for dysuria and hematuria.  Musculoskeletal: Positive for myalgias and joint pain.  Skin: Negative for rash.  Neurological: Positive for weakness. Negative for seizures, loss of consciousness and headaches.  All other systems reviewed and are negative   Past Medical History   Diagnosis Date  . Blindness   . Asthmatic bronchitis   . Asthma   . Fibrocystic breast disease   . Allergic rhinitis   . Hyperlipidemia   . Venous insufficiency     lower extremities  . HNP (herniated nucleus pulposus), lumbar     L4-L5  . Left ventricular hypertrophy 2008    mild  . Antibiotic-associated diarrhea    History reviewed. No pertinent past surgical history. Family History  Problem Relation Age of Onset  . Heart attack Sister   . Alzheimer's disease Sister   . Leukemia Mother   . Asthma Son    Social History:  reports that she has never smoked. She has never used smokeless tobacco. She reports that she drinks alcohol. She reports that she does not use illicit drugs. Allergies: No Known Allergies Medications Prior to Admission  Medication Sig Dispense Refill  . albuterol (PROVENTIL HFA;VENTOLIN HFA) 108 (90 BASE) MCG/ACT inhaler Inhale 2 puffs into the lungs every 6 (six) hours as needed for wheezing or shortness of breath. 1 Inhaler 6  . Fluticasone Furoate-Vilanterol (BREO ELLIPTA) 200-25 MCG/INH AEPB Inhale 1 puff into the lungs daily. 60 each 6  . calcium carbonate (OS-CAL) 600 MG TABS Take 600 mg by mouth daily.      . Multiple Vitamin (MULTIVITAMIN) tablet Take 1 tablet by mouth daily.    . predniSONE (DELTASONE) 5 MG tablet Take 1 tablet (5 mg total) by mouth daily. (Patient not taking: Reported on 04/06/2015) 30 tablet 5  . Pyridoxine HCl (VITAMIN B-6 PO) Take 1 tablet by mouth daily.    . traZODone (DESYREL) 50 MG tablet Take 50 mg by mouth at bedtime as needed.       Home: Home Living Family/patient expects to be discharged to::  Private residence Living Arrangements: Spouse/significant other Available Help at Discharge: Family, Available 24 hours/day Type of Home: House Home Access: Level entry [place]: One level Bathroom Shower/Tub: Multimedia programmer: [place]: Environmental consultant - 2 wheels, [place] - single point, Grab  bars - tub/shower  Lives With: Spouse   Functional History: Prior Function Level of Independence: Independent  Functional Status:  Mobility: Bed Mobility Overal bed mobility: Needs Assistance Bed Mobility: Rolling, Sidelying to Sit Rolling: Min assist Sidelying to sit: Mod assist Supine to sit: Min assist General bed mobility comments: initially attempted supine to EOB with pt demostrating little initaition at trunk and BUE, cues for BLE advancement. Switched to logrolling technique with pt completing as detailed above. Cues for sequencing, assist for powerup. Transfers Overall transfer level: Needs assistance Equipment used: Rolling walker (2 wheeled) Transfers: Sit to/from Stand Sit to Stand: Min assist Stand pivot transfers: Mod assist General transfer comment: cues for hand placement. 2x from EOB with low bed height, 1x from recliner. Cues to scoot forward prior to standing with assist provided for scooting.  Ambulation/Gait Ambulation/Gait assistance: Mod assist Ambulation Distance (Feet): 10 Feet Assistive device: Rolling walker (2 wheeled) Gait Pattern/deviations: Step-through pattern, Decreased stride length, Ataxic, Wide base of support, Trunk flexed, Staggering left General Gait Details: Mod assist for balance and walker control especially with turns. Drifts left and requires cues for correction with facilitory assist at times. No buckling although very ataxic foot placement. Gait velocity: slow Gait velocity interpretation: <1.8 ft/sec, indicative of risk for recurrent falls    ADL: ADL Overall ADL's : Needs assistance/impaired Eating/Feeding: Set up, Bed level, Sitting Eating/Feeding Details (indicate cue type and reason): poor apetite Grooming: Wash/dry hands, Wash/dry face, Oral care, Brushing hair, Moderate assistance, Sitting Grooming Details (indicate cue type and reason): completed functional mobility from EOB to sink towards goal of grooming standing at  sink Upper Body Bathing: Minimal assitance, Sitting Lower Body Bathing: Maximal assistance, Sit to/from stand Upper Body Dressing : Moderate assistance, Sitting Lower Body Dressing: Maximal assistance, Sit to/from stand Toilet Transfer: Moderate assistance, Stand-pivot, BSC Toileting- Clothing Manipulation and Hygiene: Maximal assistance, Sit to/from stand Functional mobility during ADLs: Moderate assistance, Rolling walker General ADL Comments: Pt completed bed mobility as detailed below. Pt completed in-room functional mobility with mod A and chair follow. Pt ambulated to sink towards ADL grooming goal. Pt stating after ambulation that she felt tired, mostly in her BLE. Cues needed throughout session for hand placement, sequencing.   Cognition: Cognition Overall Cognitive Status: Impaired/Different from baseline Orientation Level: Oriented to person, Oriented to place, Oriented to time, Disoriented to situation Memory: Appears intact Awareness: Appears intact Problem Solving: Appears intact Safety/Judgment: Appears intact Cognition Arousal/Alertness: Awake/alert Behavior During Therapy: Flat affect Overall Cognitive Status: Impaired/Different from baseline Area of Impairment: Safety/judgement Safety/Judgement: Decreased awareness of deficits, Decreased awareness of safety General Comments: Pt stating she was not sure if she could complete supine>EOB. Stated that she had not walked since hospital admission, reminded her of walking with PT.  Physical Exam: Blood pressure 132/69, pulse 73, temperature 97.8 F (36.6 C), temperature source Oral, resp. rate 20, height 5\' 2"  (1.575 m), weight 80.377 kg (177 lb 3.2 oz), SpO2 98 %. Physical Exam Constitutional: She is oriented to person, place, and time. She appears well-developed and well-nourished.  HENT:  Head: Normocephalic and atraumatic.  Eyes: Conjunctivae and EOM are normal.  Neck: Normal range of motion. Neck supple. No  thyromegaly present.  Cardiovascular: Normal rate, regular  rhythm and normal heart sounds.  Respiratory: Effort normal and breath sounds normal. No respiratory distress.  GI: Soft. Bowel sounds are normal. She exhibits no distension.  Musculoskeletal:  RUE 4-/5 LUE 4+/5 LLE 3/5 (history of back pain) RLE 5/5  Neurological: She is alert and oriented to person, place, and time.  Ataxia RUE CN II-XII grossly intact Oriented to person place and date of birth. Follows simple commands. Fair awareness of deficits  Skin: Skin is warm and dry.  Psychiatric: She has a normal mood and affect. Her behavior is normal   No results found for this or any previous visit (from the past 48 hour(s)). Ct Angio Head W/cm &/or Wo Cm  04/07/2015   CLINICAL DATA:  81 year old female with acute intracranial infarcts. Presented with nausea, vomiting and dizziness. Subsequent encounter.  EXAM: CT ANGIOGRAPHY HEAD AND NECK  TECHNIQUE: Multidetector CT imaging of the head and neck was performed using the standard protocol during bolus administration of intravenous contrast. Multiplanar CT image reconstructions and MIPs were obtained to evaluate the vascular anatomy. Carotid stenosis measurements (when applicable) are obtained utilizing NASCET criteria, using the distal internal carotid diameter as the denominator.  CONTRAST:  63mL OMNIPAQUE IOHEXOL 350 MG/ML SOLN  COMPARISON:  04/06/2015 brain MR.  FINDINGS: CT HEAD  Brain: Left thalamic, left hippocampus and right pontine/cerebellar infarct. No intracranial hemorrhage. Global atrophy without hydrocephalus. No intracranial enhancing lesion.  Calvarium and skull base: Negative.  Paranasal sinuses: Opacification sphenoid sinuses greater on the right.  Orbits: Post lens replacement.  CTA NECK  Aortic arch: Ectatic 3 vessel aortic arch with plaque.  Right carotid system: Ectatic common carotid artery. Plaque right carotid bifurcation with 52% diameter stenosis proximal right  internal carotid artery. Ectatic cervical segment.  Left carotid system: Ectatic common carotid artery. No significant stenosis left carotid bifurcation or proximal left internal carotid artery. Ectatic cervical segment.  Vertebral arteries:Plaque with mild narrowing proximal right vertebral artery. Mild narrowing proximal left vertebral artery. Moderate narrowing left vertebral artery C3-4 level.  Skeleton: Cervical spondylotic changes most notable C4-5 thru C5-6.  Other neck: No worrisome primary neck mass or lung apical lesion.  CTA HEAD  Anterior circulation: Internal carotid artery cavernous segment calcification with mild narrowing bilaterally. Anterior circulation without medium or large size vessel significant stenosis or occlusion.  Posterior circulation: Right vertebral artery is dominant and mildly ectatic. Narrowing distal left vertebral artery. Ectatic basilar artery with mild narrowing but without high-grade stenosis. No significant stenosis of the proximal posterior cerebral arteries. Fetal type contribution on right. Narrowing distal aspect of the posterior cerebral arteries bilaterally.  Venous sinuses: Negative  Anatomic variants: Negative  Delayed phase: Negative  IMPRESSION: CT HEAD  Left thalamic, left hippocampus and right pontine/cerebellar infarct.  Opacification sphenoid sinuses greater on the right.  CTA NECK  Ectatic 3 vessel aortic arch with plaque.  Plaque right carotid bifurcation with 52% diameter stenosis proximal right internal carotid artery.  No significant stenosis left carotid bifurcation or proximal left internal carotid artery.  Plaque with mild narrowing proximal right vertebral artery.  Mild narrowing proximal left vertebral artery. Moderate narrowing left vertebral artery C3-4 level.  CTA HEAD  Internal carotid artery cavernous segment calcification with mild narrowing bilaterally.  Anterior circulation without medium or large size vessel significant stenosis or occlusion.   Right vertebral artery is dominant and mildly ectatic.  Narrowing distal left vertebral artery.  Ectatic basilar artery with mild narrowing but without high-grade stenosis.  No significant stenosis of the proximal  posterior cerebral arteries. Fetal type contribution on right. Narrowing distal aspect of the posterior cerebral arteries bilaterally.   Electronically Signed   By: Genia Del M.D.   On: 04/07/2015 15:36   Ct Angio Neck W/cm &/or Wo/cm  04/07/2015   CLINICAL DATA:  81 year old female with acute intracranial infarcts. Presented with nausea, vomiting and dizziness. Subsequent encounter.  EXAM: CT ANGIOGRAPHY HEAD AND NECK  TECHNIQUE: Multidetector CT imaging of the head and neck was performed using the standard protocol during bolus administration of intravenous contrast. Multiplanar CT image reconstructions and MIPs were obtained to evaluate the vascular anatomy. Carotid stenosis measurements (when applicable) are obtained utilizing NASCET criteria, using the distal internal carotid diameter as the denominator.  CONTRAST:  35mL OMNIPAQUE IOHEXOL 350 MG/ML SOLN  COMPARISON:  04/06/2015 brain MR.  FINDINGS: CT HEAD  Brain: Left thalamic, left hippocampus and right pontine/cerebellar infarct. No intracranial hemorrhage. Global atrophy without hydrocephalus. No intracranial enhancing lesion.  Calvarium and skull base: Negative.  Paranasal sinuses: Opacification sphenoid sinuses greater on the right.  Orbits: Post lens replacement.  CTA NECK  Aortic arch: Ectatic 3 vessel aortic arch with plaque.  Right carotid system: Ectatic common carotid artery. Plaque right carotid bifurcation with 52% diameter stenosis proximal right internal carotid artery. Ectatic cervical segment.  Left carotid system: Ectatic common carotid artery. No significant stenosis left carotid bifurcation or proximal left internal carotid artery. Ectatic cervical segment.  Vertebral arteries:Plaque with mild narrowing proximal right  vertebral artery. Mild narrowing proximal left vertebral artery. Moderate narrowing left vertebral artery C3-4 level.  Skeleton: Cervical spondylotic changes most notable C4-5 thru C5-6.  Other neck: No worrisome primary neck mass or lung apical lesion.  CTA HEAD  Anterior circulation: Internal carotid artery cavernous segment calcification with mild narrowing bilaterally. Anterior circulation without medium or large size vessel significant stenosis or occlusion.  Posterior circulation: Right vertebral artery is dominant and mildly ectatic. Narrowing distal left vertebral artery. Ectatic basilar artery with mild narrowing but without high-grade stenosis. No significant stenosis of the proximal posterior cerebral arteries. Fetal type contribution on right. Narrowing distal aspect of the posterior cerebral arteries bilaterally.  Venous sinuses: Negative  Anatomic variants: Negative  Delayed phase: Negative  IMPRESSION: CT HEAD  Left thalamic, left hippocampus and right pontine/cerebellar infarct.  Opacification sphenoid sinuses greater on the right.  CTA NECK  Ectatic 3 vessel aortic arch with plaque.  Plaque right carotid bifurcation with 52% diameter stenosis proximal right internal carotid artery.  No significant stenosis left carotid bifurcation or proximal left internal carotid artery.  Plaque with mild narrowing proximal right vertebral artery.  Mild narrowing proximal left vertebral artery. Moderate narrowing left vertebral artery C3-4 level.  CTA HEAD  Internal carotid artery cavernous segment calcification with mild narrowing bilaterally.  Anterior circulation without medium or large size vessel significant stenosis or occlusion.  Right vertebral artery is dominant and mildly ectatic.  Narrowing distal left vertebral artery.  Ectatic basilar artery with mild narrowing but without high-grade stenosis.  No significant stenosis of the proximal posterior cerebral arteries. Fetal type contribution on right.  Narrowing distal aspect of the posterior cerebral arteries bilaterally.   Electronically Signed   By: Genia Del M.D.   On: 04/07/2015 15:36       Medical Problem List and Plan: 1. Functional deficits secondary to bilateral hemorrhagic nonhemorrhagic infarct suspect embolic 2.  DVT Prophylaxis/Anticoagulation: SCDs. Monitor for any signs of DVT 3. Pain Management: Tylenol as needed 4. Steroid-dependent asthma. Continue prednisone as  well as Dulera 5. Neuropsych: This patient is capable of making decisions on her own behalf. 6. Skin/Wound Care: Routine skin checks 7. Fluids/Electrolytes/Nutrition: Routine I&O with follow-up chemistries 8. Hyperlipidemia. Pravachol     Post Admission Physician Evaluation: 1. Functional deficits secondary  to bilateral hemorrhagic nonhemorrhagic infarct suspect embolic. 2. Patient is admitted to receive collaborative, interdisciplinary care between the physiatrist, rehab nursing staff, and therapy team. 3. Patient's level of medical complexity and substantial therapy needs in context of that medical necessity cannot be provided at a lesser intensity of care such as a SNF. 4. Patient has experienced substantial functional loss from his/her baseline which was documented above under the "Functional History" and "Functional Status" headings.  Judging by the patient's diagnosis, physical exam, and functional history, the patient has potential for functional progress which will result in measurable gains while on inpatient rehab.  These gains will be of substantial and practical use upon discharge  in facilitating mobility and self-care at the household level. 5. Physiatrist will provide 24 hour management of medical needs as well as oversight of the therapy plan/treatment and provide guidance as appropriate regarding the interaction of the two. 6. 24 hour rehab nursing will assist with bowel management, safety, disease management, medication administration and  patient education  and help integrate therapy concepts, techniques,education, etc. 7. PT will assess and treat for/with: Lower extremity strength, range of motion, stamina, balance, functional mobility, safety, adaptive techniques and equipment, focusing on LLE. Goals are: Supervision to Mod I. 8. OT will assess and treat for/with: ADL's, functional mobility, safety, upper extremity strength, adaptive techniques and equipment, focusing on ataxia. Goals are: Superivsion to Mod I. Therapy may proceed with showering this patient. 9. Case Management and Social Worker will assess and treat for psychological issues and discharge planning. 10. Team conference will be held weekly to assess progress toward goals and to determine barriers to discharge. 11. Patient will receive at least 3 hours of therapy per day at least 5 days per week. 12. ELOS: 13-17 days 13. Prognosis:  excellent     Delice Lesch, MD  04/09/2015

## 2015-04-10 NOTE — Progress Notes (Signed)
Physical Therapy Treatment Patient Details Name: Darlene Murray MRN: [id number] DOB: Oct 09, 1921 Today's Date: 04/10/2015    History of Present Illness Darlene Murray is a 81 y.o. female with Past medical history of blindness, asthma, dyslipidemia. The patient is presenting with progressive generalized weakness. MRI revealed + CVA.    PT Comments    Pt with increased L knee pain greatly limiting ambulation tolerance and quality this date. Pt leaving for CIR today.   Follow Up Recommendations  CIR     Equipment Recommendations       Recommendations for Other Services Rehab consult     Precautions / Restrictions Precautions Precautions: Fall Restrictions Weight Bearing Restrictions: No    Mobility  Bed Mobility               General bed mobility comments: pt up in chair upon PT arrival  Transfers Overall transfer level: Needs assistance Equipment used: Rolling walker (2 wheeled) Transfers: Sit to/from Stand Sit to Stand: Min assist         General transfer comment: max v/c's for hand placement  Ambulation/Gait Ambulation/Gait assistance: Mod assist Ambulation Distance (Feet): 50 Feet Assistive device: Rolling walker (2 wheeled) Gait Pattern/deviations: Step-to pattern;Antalgic Gait velocity: slo Gait velocity interpretation: <1.8 ft/sec, indicative of risk for recurrent falls General Gait Details: modA for safe walker management as pt con't to push walker too far out in front of self  pt c/o L knee pain. p   Stairs            Wheelchair Mobility    Modified Rankin (Stroke Patients Only) Modified Rankin (Stroke Patients Only) Pre-Morbid Rankin Score: No significant disability Modified Rankin: Moderately severe disability     Balance Overall balance assessment: Needs assistance         Standing balance support: Bilateral upper extremity supported Standing balance-Leahy Scale: Poor                      Cognition  Arousal/Alertness: Awake/alert Behavior During Therapy: Flat affect Overall Cognitive Status: Impaired/Different from baseline Area of Impairment: Safety/judgement         Safety/Judgement: Decreased awareness of safety     General Comments: pt more aware of surroundign this date but con't to demo delayed processing/sequencing    Exercises      General Comments        Pertinent Vitals/Pain Pain Assessment: Faces Faces Pain Scale: Hurts whole lot Pain Location: L knee    Home Living                      Prior Function        Comments: drives, active on numerous organizations   PT Goals (current goals can now be found in the care plan section) Progress towards PT goals: Progressing toward goals    Frequency  Min 4X/week    PT Plan Current plan remains appropriate    Co-evaluation             End of Session Equipment Utilized During Treatment: Gait belt Activity Tolerance: Patient limited by fatigue;Patient limited by pain Patient left: in chair;with call bell/phone within reach;with family/visitor present     Time: 1194-1740 PT Time Calculation (min) (ACUTE ONLY): 10 min  Charges:  $Gait Training: 8-22 mins                    G Codes:      Kingsley Callander 04/10/2015, 1:59 PM  Kittie Plater, PT, DPT Pager #: [phone] Office #: [phone]

## 2015-04-10 NOTE — Clinical Social Work Note (Signed)
Clinical Social Worker notified that patient will be admitted into IP REHAB today.   Clinical Social Worker will sign off for now as social work intervention is no longer needed. Please consult [place] again if new need arises.  Glendon Axe, MSW, LCSWA [phone] 04/10/2015 8:08 AM

## 2015-04-10 NOTE — Progress Notes (Signed)
Received pt. As a transfer from Darlene Murray.Pt. And her husband were oriented to the unit routine and protocol.Safety plan was explained,fall prevention plan was explained and sign by the pt.'s husband and RN.Safety video was played.Keep monitoring pt. Closely and assessing her needs.

## 2015-04-10 NOTE — Progress Notes (Signed)
PMR Admission Coordinator Pre-Admission Assessment  Patient: Darlene Murray is an 81 y.o., female MRN: [id number] DOB: May 04, 1922 Height: 5\' 2"  (157.5 cm) Weight: 80.377 kg (177 lb 3.2 oz)  Insurance Information HMO: PPO: PCP: IPA: 80/20: yes OTHER: no HMO PRIMARY: Medicare a and b Policy#: [id number] Subscriber: pt Benefits: Phone #: online palmetto Name: 04/10/15 Eff. Date: 03/07/89 Deduct: $1288 Out of Pocket Max: none Life Max: none CIR: 100% SNF: 20 full days Outpatient: 80% Co-Pay: 20% Home Health: 100% Co-Pay: none DME: 80% Co-Pay: 20% Providers: pt choice  SECONDARY: [facility] Policy#: [id number] Subscriber: pt No auth required with medicare primary  Medicaid Application Date: Case Manager:  Disability Application Date: Case Worker:   Emergency Contact Information Contact Information    Name Relation Home Work Mobile   [place] Spouse [phone]  [phone]   [place] Son [phone]       Current Medical History  Patient Admitting Diagnosis: bilateral hemorrhagic and non hemorrhagic infarcts  History of Present Illness:Darlene Murray is a 81 y.o. Darlene Murray is a 81 y.o. right handed female with history of steroid-dependent for asthma and hyperlipidemia. Patient lives with spouse and was independent prior to admission. Spouse is retired and able to care for pt on discharge. Presented 04/06/2015 with nausea and vomiting, dizziness, decrease in balance and generalized weakness as well as some mild slurred speech. MRI imaging revealed acute moderate sized nonhemorrhagic infarct posterior right upper and mid pons and adjacent right cerebellum. Acute tiny nonhemorrhagic infarct left  thalamic infarct. Acute nonhemorrhagic left occipital lobe infarct. CTA of head and neck with no significant stenosis or occlusion. Echocardiogram results are pending. Patient did not receive TPA. Neurology consulted maintain on aspirin 325 mg daily for CVA prophylaxis. Patient continues on low dose prednisone for history of asthma. Tolerating a regular consistency diet.   Total: 0 NIH    Past Medical History  Past Medical History  Diagnosis Date  . Blindness   . Asthmatic bronchitis   . Asthma   . Fibrocystic breast disease   . Allergic rhinitis   . Hyperlipidemia   . Venous insufficiency     lower extremities  . HNP (herniated nucleus pulposus), lumbar     L4-L5  . Left ventricular hypertrophy 2008    mild  . Antibiotic-associated diarrhea     Family History  family history includes Alzheimer's disease in her sister; Asthma in her son; Heart attack in her sister; Leukemia in her mother.  Prior Rehab/Hospitalizations:  Has the patient had major surgery during 100 days prior to admission? No  Current Medications   Current facility-administered medications:  . acetaminophen (TYLENOL) tablet 650 mg, 650 mg, Oral, Q6H PRN **OR** acetaminophen (TYLENOL) suppository 650 mg, 650 mg, Rectal, Q6H PRN, Lavina Hamman, MD . albuterol (PROVENTIL) (2.5 MG/3ML) 0.083% nebulizer solution 2.5 mg, 2.5 mg, Nebulization, Q2H PRN, Jonetta Osgood, MD . aspirin EC tablet 325 mg, 325 mg, Oral, Daily, Greta Doom, MD, 325 mg at 04/10/15 0932 . calcium carbonate (OS-CAL - dosed in mg of elemental calcium) tablet 500 mg of elemental calcium, 500 mg of elemental calcium, Oral, Daily, Jonetta Osgood, MD, 500 mg of elemental calcium at 04/10/15 0932 . ipratropium-albuterol (DUONEB) 0.5-2.5 (3) MG/3ML nebulizer solution 3 mL, 3 mL, Nebulization, BID, Jonetta Osgood, MD, 3 mL at 04/10/15 0855 . mometasone-formoterol (DULERA) 100-5 MCG/ACT  inhaler 2 puff, 2 puff, Inhalation, BID, Lavina Hamman, MD, 2 puff at 04/10/15 0855 . ondansetron (ZOFRAN) tablet 4 mg,  4 mg, Oral, Q6H PRN **OR** ondansetron (ZOFRAN) injection 4 mg, 4 mg, Intravenous, Q6H PRN, Lavina Hamman, MD . pravastatin (PRAVACHOL) tablet 20 mg, 20 mg, Oral, q1800, David L Rinehuls, PA-C, 20 mg at 04/09/15 1858 . predniSONE (DELTASONE) tablet 5 mg, 5 mg, Oral, Daily, Lavina Hamman, MD, 5 mg at 04/10/15 0932 . traZODone (DESYREL) tablet 50 mg, 50 mg, Oral, QHS PRN, Jonetta Osgood, MD  Patients Current Diet: Diet regular Room service appropriate?: Yes; Fluid consistency:: Thin Diet - low sodium heart healthy  Precautions / Restrictions Precautions Precautions: Fall Restrictions Weight Bearing Restrictions: No   Has the patient had 2 or more falls or a fall with injury in the past year?No  Prior Activity Level Community (5-7x/wk): very active daily in the community; drives Pt very active in the community. [employer], numerous Training and development officer, Estate agent. In the community daily and drives. Does a lot of computer work. Does not use any AD pta. Hs two schools in [place]. named after her. 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 Invited to the white house for [employer].  Home Assistive Devices / Equipment Home Assistive Devices/Equipment: None Home Equipment: Environmental consultant - 2 wheels, Cashiers - single point, Grab bars - tub/shower  Prior Device Use: Indicate devices/aids used by the patient prior to current illness, exacerbation or injury? None of the above  Prior Functional Level Prior Function Level of Independence: Independent Comments: drives, active on numerous organizations  Self Care: Did the patient need help bathing, dressing, using the toilet or eating? Independent  Indoor Mobility: Did the patient need assistance with walking from room to room (with or without device)?  Independent  Stairs: Did the patient need assistance with internal or external stairs (with or without device)? Independent  Functional Cognition: Did the patient need help planning regular tasks such as shopping or remembering to take medications? Independent  Current Functional Level Cognition  Overall Cognitive Status: Impaired/Different from baseline Orientation Level: Oriented X4 Safety/Judgement: Decreased awareness of deficits, Decreased awareness of safety General Comments: pt with noted delayed processing and possibly comprehension Memory: Appears intact Awareness: Appears intact Problem Solving: Appears intact Safety/Judgment: Appears intact   Extremity Assessment (includes Sensation/Coordination)  Upper Extremity Assessment: RUE deficits/detail, LUE deficits/detail RUE Deficits / Details: strength grossly 4/5-4+/5. Dysmetria noted RUE Coordination: decreased fine motor, decreased gross motor LUE Deficits / Details: mild dysmetria noted   Lower Extremity Assessment: Defer to PT evaluation    ADLs  Overall ADL's : Needs assistance/impaired Eating/Feeding: Set up, Bed level, Sitting Eating/Feeding Details (indicate cue type and reason): poor apetite Grooming: Wash/dry hands, Wash/dry face, Oral care, Brushing hair, Moderate assistance, Sitting Grooming Details (indicate cue type and reason): completed functional mobility from EOB to sink towards goal of grooming standing at sink Upper Body Bathing: Minimal assitance, Sitting Lower Body Bathing: Maximal assistance, Sit to/from stand Upper Body Dressing : Moderate assistance, Sitting Lower Body Dressing: Maximal assistance, Sit to/from stand Toilet Transfer: Moderate assistance, Stand-pivot, BSC Toileting- Clothing Manipulation and Hygiene: Maximal assistance, Sit to/from stand Functional mobility during ADLs: Moderate assistance, Rolling walker General ADL Comments: Pt completed bed mobility as detailed below. Pt  completed in-room functional mobility with mod A and chair follow. Pt ambulated to sink towards ADL grooming goal. Pt stating after ambulation that she felt tired, mostly in her BLE. Cues needed throughout session for hand placement, sequencing.     Mobility  Overal bed mobility: Needs Assistance Bed  Mobility: Rolling, Sidelying to Sit Rolling: Min assist Sidelying to sit: Mod assist Supine to sit: Min assist General bed mobility comments: max tactile cues to complete task, assist to elevate trunk    Transfers  Overall transfer level: Needs assistance Equipment used: Rolling walker (2 wheeled) Transfers: Sit to/from Stand Sit to Stand: Min assist Stand pivot transfers: Mod assist General transfer comment: verbal and tactile cues to push up from bed, increased time    Ambulation / Gait / Stairs / Wheelchair Mobility  Ambulation/Gait Ambulation/Gait assistance: Mod assist Ambulation Distance (Feet): 50 Feet Assistive device: Rolling walker (2 wheeled) Gait Pattern/deviations: Step-through pattern, Decreased stride length, Shuffle General Gait Details: max encouragement to participate, noted decreased L LE step height. max assist for safe walker management due to pt trying to push out to far in front Gait velocity: slow Gait velocity interpretation: <1.8 ft/sec, indicative of risk for recurrent falls    Posture / Balance Dynamic Sitting Balance Sitting balance - Comments: sat EOB talking on phone with no LOB or swaying Balance Overall balance assessment: Needs assistance Sitting-balance support: Feet supported, No upper extremity supported Sitting balance-Leahy Scale: Fair Sitting balance - Comments: sat EOB talking on phone with no LOB or swaying Standing balance support: Bilateral upper extremity supported Standing balance-Leahy Scale: Poor Standing balance comment: mod A and rw    Special needs/care consideration  Skin intact Bowel mgmt: continent LBM  10/1 Bladder mgmt: continent Diabetic mgmt no   Previous Home Environment Living Arrangements: Spouse/significant other Lives With: Spouse Available Help at Discharge: Family, Available 24 hours/day Type of Home: House Home Layout: One level Home Access: Level entry Bathroom Shower/Tub: Multimedia programmer: Handicapped height Bathroom Accessibility: Yes How Accessible: Accessible via walker Home Care Services: No  Discharge Living Setting Plans for Discharge Living Setting: Patient's home, Lives with (comment) (spouse of 10 years) Type of Home at Discharge: House Discharge Home Layout: One level Discharge Home Access: Level entry Discharge Bathroom Shower/Tub: Walk-in shower, Other (comment) (has two built in seats in shower) Discharge Bathroom Toilet: Handicapped height Discharge Bathroom Accessibility: Yes How Accessible: Accessible via walker Does the patient have any problems obtaining your medications?: No  Social/Family/Support Systems Patient Roles: Spouse, Parent, Psychologist, occupational (active with church, on numerous boards, 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 Information: spouse and local son Anticipated Caregiver: spouse and local son Anticipated Ambulance person Information: see above Ability/Limitations of Caregiver: no limitations Caregiver Availability: 24/7 Discharge Plan Discussed with Primary Caregiver: Yes Is Caregiver In Agreement with Plan?: Yes Does Caregiver/Family have Issues with Lodging/Transportation while Pt is in Rehab?: No  Goals/Additional Needs Patient/Family Goal for Rehab: supervision with PT, OT, and SLP Expected length of stay: [facility] 13-17 days Additional Information: pt does not want any visitors besides her son and spouse at this time Pt/Family Agrees to Admission and willing to participate: Yes Program Orientation Provided & Reviewed with Pt/Caregiver Including Roles & Responsibilities: Yes  Decrease burden of Care through IP rehab  admission: n/a  Possible need for SNF placement upon discharge:not anticipated  Patient Condition: This patient's condition remains as documented in the consult dated 04/09/15, in which the Rehabilitation Physician determined and documented that the patient's condition is appropriate for intensive rehabilitative care in an inpatient rehabilitation facility. Will admit to inpatient rehab today.  Preadmission Screen Completed By: Cleatrice Burke, 04/10/2015 10:50 AM ______________________________________________________________________  Discussed status with Dr. Posey Pronto on 04/10/2015 at 15 and received telephone approval for admission today.  Admission Coordinator: Cleatrice Burke, time  1050 Date 04/10/2015.          Cosigned by: Ankit Lorie Phenix, MD at 04/10/2015 11:00 AM  Revision History     Date/Time User Provider Type Action   04/10/2015 11:00 AM Ankit Lorie Phenix, MD Physician Cosign   04/10/2015 10:57 AM Cristina Gong, RN Rehab Admission Coordinator Sign   04/10/2015 10:57 AM Cristina Gong, RN Rehab Admission Coordinator Sign   04/10/2015 10:56 AM Cristina Gong, RN Rehab Admission Coordinator Sign   04/10/2015 10:51 AM Cristina Gong, RN Rehab Admission Coordinator Sign   View Details Report

## 2015-04-10 NOTE — Progress Notes (Signed)
Pt discharge education and instructions completed with pt; pt discharge to CIR and report called off to CIR RN at 1330. Pt transported off unit via wheelchair with belongings and spouse at side. Francis Gaines Amir Glaus RN.

## 2015-04-10 NOTE — PMR Pre-admission (Signed)
PMR Admission Coordinator Pre-Admission Assessment  Patient: Darlene Murray is an 81 y.o., female MRN: [id number] DOB: Apr 26, 1922 Height: 5\' 2"  (157.5 cm) Weight: 80.377 kg (177 lb 3.2 oz)              Insurance Information HMO:     PPO:      PCP:      IPA:      80/20: yes     OTHER: no HMO PRIMARY: Medicare a and b      Policy#: [id number]      Subscriber: pt Benefits:  Phone #: online [email or web address]     Name: 04/10/15 Eff. Date: 03/07/89     Deduct: $1288      Out of Pocket Max: none      Life Max: none CIR: 100%      SNF: 20 full days Outpatient: 80%     Co-Pay: 20% Home Health: 100%      Co-Pay: none DME: 80%     Co-Pay: 20% Providers: pt choice  SECONDARY: [facility]      Policy#: [id number]      Subscriber: pt No auth required with medicare primary  Medicaid Application Date:       Case Manager:  Disability Application Date:       Case Worker:   Emergency Contact Information Contact Information    Name Relation Home Work Mobile   [place] Spouse [phone]  [phone]   [place] Son [phone]       Current Medical History  Patient Admitting Diagnosis: bilateral hemorrhagic and non hemorrhagic infarcts  History of Present Illness:Darlene Murray is a 81 y.o. Darlene Murray is a 81 y.o. right handed female with history of steroid-dependent for asthma and hyperlipidemia. Patient lives with spouse and was independent prior to admission. Spouse is retired and able to care for pt on discharge. Presented 04/06/2015 with nausea and vomiting, dizziness, decrease in balance and generalized weakness as well as some mild slurred speech. MRI imaging revealed acute moderate sized nonhemorrhagic infarct posterior right upper and mid pons and adjacent right cerebellum. Acute tiny nonhemorrhagic infarct left thalamic infarct. Acute nonhemorrhagic left occipital lobe infarct. CTA of head and neck with no significant stenosis or occlusion. Echocardiogram results  are pending. Patient did not receive TPA. Neurology consulted maintain on aspirin 325 mg daily for CVA prophylaxis. Patient continues on low dose prednisone for history of asthma. Tolerating a regular consistency diet.    Total: 0 NIH    Past Medical History  Past Medical History  Diagnosis Date  . Blindness   . Asthmatic bronchitis   . Asthma   . Fibrocystic breast disease   . Allergic rhinitis   . Hyperlipidemia   . Venous insufficiency     lower extremities  . HNP (herniated nucleus pulposus), lumbar     L4-L5  . Left ventricular hypertrophy 2008    mild  . Antibiotic-associated diarrhea     Family History  family history includes Alzheimer's disease in her sister; Asthma in her son; Heart attack in her sister; Leukemia in her mother.  Prior Rehab/Hospitalizations:  Has the patient had major surgery during 100 days prior to admission? No  Current Medications   Current facility-administered medications:  .  acetaminophen (TYLENOL) tablet 650 mg, 650 mg, Oral, Q6H PRN **OR** acetaminophen (TYLENOL) suppository 650 mg, 650 mg, Rectal, Q6H PRN, Lavina Hamman, MD .  albuterol (PROVENTIL) (2.5 MG/3ML) 0.083% nebulizer solution 2.5 mg, 2.5 mg, Nebulization, Q2H PRN, Jonetta Osgood,  MD .  aspirin EC tablet 325 mg, 325 mg, Oral, Daily, Greta Doom, MD, 325 mg at 04/10/15 0932 .  calcium carbonate (OS-CAL - dosed in mg of elemental calcium) tablet 500 mg of elemental calcium, 500 mg of elemental calcium, Oral, Daily, Jonetta Osgood, MD, 500 mg of elemental calcium at 04/10/15 0932 .  ipratropium-albuterol (DUONEB) 0.5-2.5 (3) MG/3ML nebulizer solution 3 mL, 3 mL, Nebulization, BID, Jonetta Osgood, MD, 3 mL at 04/10/15 0855 .  mometasone-formoterol (DULERA) 100-5 MCG/ACT inhaler 2 puff, 2 puff, Inhalation, BID, Lavina Hamman, MD, 2 puff at 04/10/15 0855 .  ondansetron (ZOFRAN) tablet 4 mg, 4 mg, Oral, Q6H PRN **OR** ondansetron (ZOFRAN) injection 4 mg, 4 mg,  Intravenous, Q6H PRN, Lavina Hamman, MD .  pravastatin (PRAVACHOL) tablet 20 mg, 20 mg, Oral, q1800, David L Rinehuls, PA-C, 20 mg at 04/09/15 1858 .  predniSONE (DELTASONE) tablet 5 mg, 5 mg, Oral, Daily, Lavina Hamman, MD, 5 mg at 04/10/15 0932 .  traZODone (DESYREL) tablet 50 mg, 50 mg, Oral, QHS PRN, Jonetta Osgood, MD  Patients Current Diet: Diet regular Room service appropriate?: Yes; Fluid consistency:: Thin Diet - low sodium heart healthy  Precautions / Restrictions Precautions Precautions: Fall Restrictions Weight Bearing Restrictions: No   Has the patient had 2 or more falls or a fall with injury in the past year?No  Prior Activity Level Community (5-7x/wk): very active daily in the community; drives Pt very active in the community. [employer], numerous Training and development officer, Estate agent. In the community daily and drives. Does a lot of computer work. Does not use any AD pta.  Hs two schools in [place]. named after her. 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 Invited to the white house for [employer].  Home Assistive Devices / Equipment Home Assistive Devices/Equipment: None Home Equipment: Environmental consultant - 2 wheels, [place] - single point, Grab bars - tub/shower  Prior Device Use: Indicate devices/aids used by the patient prior to current illness, exacerbation or injury? None of the above  Prior Functional Level Prior Function Level of Independence: Independent Comments: drives, active on numerous organizations  Self Care: Did the patient need help bathing, dressing, using the toilet or eating?  Independent  Indoor Mobility: Did the patient need assistance with walking from room to room (with or without device)? Independent  Stairs: Did the patient need assistance with internal or external stairs (with or without device)? Independent  Functional Cognition: Did the patient need help planning regular tasks  such as shopping or remembering to take medications? Independent  Current Functional Level Cognition  Overall Cognitive Status: Impaired/Different from baseline Orientation Level: Oriented X4 Safety/Judgement: Decreased awareness of deficits, Decreased awareness of safety General Comments: pt with noted delayed processing and possibly comprehension Memory: Appears intact Awareness: Appears intact Problem Solving: Appears intact Safety/Judgment: Appears intact    Extremity Assessment (includes Sensation/Coordination)  Upper Extremity Assessment: RUE deficits/detail, LUE deficits/detail RUE Deficits / Details: strength grossly 4/5-4+/5.  Dysmetria noted RUE Coordination: decreased fine motor, decreased gross motor LUE Deficits / Details: mild dysmetria noted   Lower Extremity Assessment: Defer to PT evaluation    ADLs  Overall ADL's : Needs assistance/impaired Eating/Feeding: Set up, Bed level, Sitting Eating/Feeding Details (indicate cue type and reason): poor apetite Grooming: Wash/dry hands, Wash/dry face, Oral care, Brushing hair, Moderate assistance, Sitting Grooming Details (indicate cue type and reason): completed functional mobility from EOB to sink towards goal of  grooming standing at sink Upper Body Bathing: Minimal assitance, Sitting Lower Body Bathing: Maximal assistance, Sit to/from stand Upper Body Dressing : Moderate assistance, Sitting Lower Body Dressing: Maximal assistance, Sit to/from stand Toilet Transfer: Moderate assistance, Stand-pivot, BSC Toileting- Clothing Manipulation and Hygiene: Maximal assistance, Sit to/from stand Functional mobility during ADLs: Moderate assistance, Rolling walker General ADL Comments: Pt completed bed mobility as detailed below. Pt completed in-room functional mobility with mod A and chair follow. Pt ambulated to sink towards ADL grooming goal. Pt stating after ambulation that she felt tired, mostly in her BLE. Cues needed throughout  session for hand placement, sequencing.     Mobility  Overal bed mobility: Needs Assistance Bed Mobility: Rolling, Sidelying to Sit Rolling: Min assist Sidelying to sit: Mod assist Supine to sit: Min assist General bed mobility comments: max tactile cues to complete task, assist to elevate trunk    Transfers  Overall transfer level: Needs assistance Equipment used: Rolling walker (2 wheeled) Transfers: Sit to/from Stand Sit to Stand: Min assist Stand pivot transfers: Mod assist General transfer comment: verbal and tactile cues to push up from bed, increased time    Ambulation / Gait / Stairs / Wheelchair Mobility  Ambulation/Gait Ambulation/Gait assistance: Mod assist Ambulation Distance (Feet): 50 Feet Assistive device: Rolling walker (2 wheeled) Gait Pattern/deviations: Step-through pattern, Decreased stride length, Shuffle General Gait Details: max encouragement to participate, noted decreased L LE step height. max assist for safe walker management due to pt trying to push out to far in front Gait velocity: slow Gait velocity interpretation: <1.8 ft/sec, indicative of risk for recurrent falls    Posture / Balance Dynamic Sitting Balance Sitting balance - Comments: sat EOB talking on phone with no LOB or swaying Balance Overall balance assessment: Needs assistance Sitting-balance support: Feet supported, No upper extremity supported Sitting balance-Leahy Scale: Fair Sitting balance - Comments: sat EOB talking on phone with no LOB or swaying Standing balance support: Bilateral upper extremity supported Standing balance-Leahy Scale: Poor Standing balance comment: mod A and rw    Special needs/care consideration  Skin intact Bowel mgmt: continent LBM 10/1 Bladder mgmt: continent Diabetic mgmt no   Previous Home Environment Living Arrangements: Spouse/significant other  Lives With: Spouse Available Help at Discharge: Family, Available 24 hours/day Type of Home:  House Home Layout: One level Home Access: Level entry Bathroom Shower/Tub: Multimedia programmer: Handicapped height Bathroom Accessibility: Yes How Accessible: Accessible via walker Home Care Services: No  Discharge Living Setting Plans for Discharge Living Setting: Patient's home, Lives with (comment) (spouse of 10 years) Type of Home at Discharge: House Discharge Home Layout: One level Discharge Home Access: Level entry Discharge Bathroom Shower/Tub: Walk-in shower, Other (comment) (has two built in seats in shower) Discharge Bathroom Toilet: Handicapped height Discharge Bathroom Accessibility: Yes How Accessible: Accessible via walker Does the patient have any problems obtaining your medications?: No  Social/Family/Support Systems Patient Roles: Spouse, Parent, Psychologist, occupational (active  with church, on numerous boards, 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 Information: spouse and local son Anticipated Caregiver: spouse and local son Anticipated Ambulance person Information: see above Ability/Limitations of Caregiver: no limitations Caregiver Availability: 24/7 Discharge Plan Discussed with Primary Caregiver: Yes Is Caregiver In Agreement with Plan?: Yes Does Caregiver/Family have Issues with Lodging/Transportation while Pt is in Rehab?: No  Goals/Additional Needs Patient/Family Goal for Rehab: supervision with PT, OT, and SLP Expected length of stay: [facility] 13-17 days Additional Information: pt does not want any visitors besides her son and spouse at this  time Pt/Family Agrees to Admission and willing to participate: Yes Program Orientation Provided & Reviewed with Pt/Caregiver Including Roles  & Responsibilities: Yes  Decrease burden of Care through IP rehab admission: n/a  Possible need for SNF placement upon discharge:not anticipated  Patient Condition: This patient's condition remains as documented in the consult dated 04/09/15, in which the Rehabilitation Physician  determined and documented that the patient's condition is appropriate for intensive rehabilitative care in an inpatient rehabilitation facility. Will admit to inpatient rehab today.  Preadmission Screen Completed By:  Cleatrice Burke, 04/10/2015 10:50 AM ______________________________________________________________________   Discussed status with Dr. Posey Pronto on 04/10/2015 at  32 and received telephone approval for admission today.  Admission Coordinator:  Cleatrice Burke, time [id number] Date 04/10/2015.

## 2015-04-10 NOTE — Progress Notes (Deleted)
Physical Medicine and Rehabilitation Consult Reason for Consult: Bilateral hemorrhagic and nonhemorrhagic infarcts felt to be embolic Referring Physician: [facility]   HPI: Darlene Murray is a 81 y.o. Darlene Murray is a 81 y.o. right handed female with history of steroid-dependent for asthma and hyperlipidemia. Patient lives with spouse and was independent prior to admission. Spouse is retired and able to care for pt on discharge. Presented 04/06/2015 with nausea and vomiting, dizziness, decrease in balance and generalized weakness as well as some mild slurred speech. MRI imaging revealed acute moderate sized nonhemorrhagic infarct posterior right upper and mid pons and adjacent right cerebellum. Acute tiny nonhemorrhagic infarct left thalamic infarct. Acute nonhemorrhagic left occipital lobe infarct. CTA of head and neck with no significant stenosis or occlusion. Echocardiogram results are pending. Patient did not receive TPA. Neurology consulted maintain on aspirin 325 mg daily for CVA prophylaxis. Patient continues on low dose prednisone for history of asthma. Tolerating a regular consistency diet. Therapies have been initiated. M.D. has requested physical medicine rehabilitation consult   Review of Systems  Constitutional: Negative for fever and chills.  HENT: Negative for hearing loss.  Eyes: Negative for pain.  Respiratory: Negative for cough.   Shortness of breath on exertion  Cardiovascular: Positive for palpitations. Negative for chest pain and leg swelling.  Gastrointestinal: Positive for constipation. Negative for nausea and vomiting.  Genitourinary: Negative for dysuria and hematuria.  Musculoskeletal: Positive for myalgias and joint pain.  Skin: Negative for rash.  Neurological: Positive for weakness. Negative for seizures, loss of consciousness and headaches.  All other systems reviewed and are negative.  Past Medical History  Diagnosis Date  . Blindness    . Asthmatic bronchitis   . Asthma   . Fibrocystic breast disease   . Allergic rhinitis   . Hyperlipidemia   . Venous insufficiency     lower extremities  . HNP (herniated nucleus pulposus), lumbar     L4-L5  . Left ventricular hypertrophy 2008    mild  . Antibiotic-associated diarrhea    History reviewed. No pertinent past surgical history. Family History  Problem Relation Age of Onset  . Heart attack Sister   . Alzheimer's disease Sister   . Leukemia Mother   . Asthma Son    Social History:  reports that she has never smoked. She has never used smokeless tobacco. She reports that she drinks alcohol. She reports that she does not use illicit drugs. Allergies: No Known Allergies Medications Prior to Admission  Medication Sig Dispense Refill  . albuterol (PROVENTIL HFA;VENTOLIN HFA) 108 (90 BASE) MCG/ACT inhaler Inhale 2 puffs into the lungs every 6 (six) hours as needed for wheezing or shortness of breath. 1 Inhaler 6  . Fluticasone Furoate-Vilanterol (BREO ELLIPTA) 200-25 MCG/INH AEPB Inhale 1 puff into the lungs daily. 60 each 6  . calcium carbonate (OS-CAL) 600 MG TABS Take 600 mg by mouth daily.     . Multiple Vitamin (MULTIVITAMIN) tablet Take 1 tablet by mouth daily.    . predniSONE (DELTASONE) 5 MG tablet Take 1 tablet (5 mg total) by mouth daily. (Patient not taking: Reported on 04/06/2015) 30 tablet 5  . Pyridoxine HCl (VITAMIN B-6 PO) Take 1 tablet by mouth daily.    . traZODone (DESYREL) 50 MG tablet Take 50 mg by mouth at bedtime as needed.       Home: Home Living Family/patient expects to be discharged to:: Private residence Living Arrangements: Spouse/significant other Available Help at Discharge: Family, Available 24 hours/day Type  of Home: House Home Access: Level entry Home Layout: One level Bathroom Shower/Tub: Multimedia programmer: Handicapped  height Home Equipment: Environmental consultant - 2 wheels, Mercer - single point, Grab bars - tub/shower Lives With: Spouse  Functional History: Prior Function Level of Independence: Independent Functional Status:  Mobility: Bed Mobility Overal bed mobility: Needs Assistance Bed Mobility: Rolling, Sidelying to Sit Rolling: Min assist Sidelying to sit: Mod assist Supine to sit: Min assist General bed mobility comments: initially attempted supine to EOB with pt demostrating little initaition at trunk and BUE, cues for BLE advancement. Switched to logrolling technique with pt completing as detailed above. Cues for sequencing, assist for powerup. Transfers Overall transfer level: Needs assistance Equipment used: Rolling walker (2 wheeled) Transfers: Sit to/from Stand Sit to Stand: Min assist Stand pivot transfers: Mod assist General transfer comment: cues for hand placement. 2x from EOB with low bed height, 1x from recliner. Cues to scoot forward prior to standing with assist provided for scooting.  Ambulation/Gait Ambulation/Gait assistance: Mod assist Ambulation Distance (Feet): 10 Feet Assistive device: Rolling walker (2 wheeled) Gait Pattern/deviations: Step-through pattern, Decreased stride length, Ataxic, Wide base of support, Trunk flexed, Staggering left General Gait Details: Mod assist for balance and walker control especially with turns. Drifts left and requires cues for correction with facilitory assist at times. No buckling although very ataxic foot placement. Gait velocity: slow Gait velocity interpretation: <1.8 ft/sec, indicative of risk for recurrent falls    ADL: ADL Overall ADL's : Needs assistance/impaired Eating/Feeding: Set up, Bed level, Sitting Eating/Feeding Details (indicate cue type and reason): poor apetite Grooming: Wash/dry hands, Wash/dry face, Oral care, Brushing hair, Moderate assistance, Sitting Grooming Details (indicate cue type and reason): completed functional  mobility from EOB to sink towards goal of grooming standing at sink Upper Body Bathing: Minimal assitance, Sitting Lower Body Bathing: Maximal assistance, Sit to/from stand Upper Body Dressing : Moderate assistance, Sitting Lower Body Dressing: Maximal assistance, Sit to/from stand Toilet Transfer: Moderate assistance, Stand-pivot, BSC Toileting- Clothing Manipulation and Hygiene: Maximal assistance, Sit to/from stand Functional mobility during ADLs: Moderate assistance, Rolling walker General ADL Comments: Pt completed bed mobility as detailed below. Pt completed in-room functional mobility with mod A and chair follow. Pt ambulated to sink towards ADL grooming goal. Pt stating after ambulation that she felt tired, mostly in her BLE. Cues needed throughout session for hand placement, sequencing.   Cognition: Cognition Overall Cognitive Status: Impaired/Different from baseline Orientation Level: Oriented to person, Oriented to place, Oriented to time, Disoriented to situation Memory: Appears intact Awareness: Appears intact Problem Solving: Appears intact Safety/Judgment: Appears intact Cognition Arousal/Alertness: Awake/alert Behavior During Therapy: Flat affect Overall Cognitive Status: Impaired/Different from baseline Area of Impairment: Safety/judgement Safety/Judgement: Decreased awareness of deficits, Decreased awareness of safety General Comments: Pt stating she was not sure if she could complete supine>EOB. Stated that she had not walked since hospital admission, reminded her of walking with PT.  Blood pressure 132/69, pulse 73, temperature 97.8 F (36.6 C), temperature source Oral, resp. rate 20, height 5\' 2"  (1.575 m), weight 80.377 kg (177 lb 3.2 oz), SpO2 98 %. Physical Exam  Nursing note and vitals reviewed. Constitutional: She is oriented to person, place, and time. She appears well-developed and well-nourished.  HENT:  Head: Normocephalic and atraumatic.  Eyes:  Conjunctivae and EOM are normal.  Neck: Normal range of motion. Neck supple. No thyromegaly present.  Cardiovascular: Normal rate, regular rhythm and normal heart sounds.  Respiratory: Effort normal and breath sounds normal. No  respiratory distress.  GI: Soft. Bowel sounds are normal. She exhibits no distension.  Musculoskeletal:  RUE 4-/5 LUE 4+/5 LLE 3/5 (history of back pain) RLE 5/5  Neurological: She is alert and oriented to person, place, and time.  Ataxia RUE CN II-XII grossly intact Oriented to person place and date of birth. Follows simple commands. Fair awareness of deficits  Skin: Skin is warm and dry.  Psychiatric: She has a normal mood and affect. Her behavior is normal.     Lab Results Last 24 Hours    No results found for this or any previous visit (from the past 24 hour(s)).    Imaging Results (Last 48 hours)    Ct Angio Head W/cm &/or Wo Cm  04/07/2015 CLINICAL DATA: 81 year old female with acute intracranial infarcts. Presented with nausea, vomiting and dizziness. Subsequent encounter. EXAM: CT ANGIOGRAPHY HEAD AND NECK TECHNIQUE: Multidetector CT imaging of the head and neck was performed using the standard protocol during bolus administration of intravenous contrast. Multiplanar CT image reconstructions and MIPs were obtained to evaluate the vascular anatomy. Carotid stenosis measurements (when applicable) are obtained utilizing NASCET criteria, using the distal internal carotid diameter as the denominator. CONTRAST: 52mL OMNIPAQUE IOHEXOL 350 MG/ML SOLN COMPARISON: 04/06/2015 brain MR. FINDINGS: CT HEAD Brain: Left thalamic, left hippocampus and right pontine/cerebellar infarct. No intracranial hemorrhage. Global atrophy without hydrocephalus. No intracranial enhancing lesion. Calvarium and skull base: Negative. Paranasal sinuses: Opacification sphenoid sinuses greater on the right. Orbits: Post lens replacement. CTA NECK Aortic arch: Ectatic 3 vessel  aortic arch with plaque. Right carotid system: Ectatic common carotid artery. Plaque right carotid bifurcation with 52% diameter stenosis proximal right internal carotid artery. Ectatic cervical segment. Left carotid system: Ectatic common carotid artery. No significant stenosis left carotid bifurcation or proximal left internal carotid artery. Ectatic cervical segment. Vertebral arteries:Plaque with mild narrowing proximal right vertebral artery. Mild narrowing proximal left vertebral artery. Moderate narrowing left vertebral artery C3-4 level. Skeleton: Cervical spondylotic changes most notable C4-5 thru C5-6. Other neck: No worrisome primary neck mass or lung apical lesion. CTA HEAD Anterior circulation: Internal carotid artery cavernous segment calcification with mild narrowing bilaterally. Anterior circulation without medium or large size vessel significant stenosis or occlusion. Posterior circulation: Right vertebral artery is dominant and mildly ectatic. Narrowing distal left vertebral artery. Ectatic basilar artery with mild narrowing but without high-grade stenosis. No significant stenosis of the proximal posterior cerebral arteries. Fetal type contribution on right. Narrowing distal aspect of the posterior cerebral arteries bilaterally. Venous sinuses: Negative Anatomic variants: Negative Delayed phase: Negative IMPRESSION: CT HEAD Left thalamic, left hippocampus and right pontine/cerebellar infarct. Opacification sphenoid sinuses greater on the right. CTA NECK Ectatic 3 vessel aortic arch with plaque. Plaque right carotid bifurcation with 52% diameter stenosis proximal right internal carotid artery. No significant stenosis left carotid bifurcation or proximal left internal carotid artery. Plaque with mild narrowing proximal right vertebral artery. Mild narrowing proximal left vertebral artery. Moderate narrowing left vertebral artery C3-4 level. CTA HEAD Internal carotid artery  cavernous segment calcification with mild narrowing bilaterally. Anterior circulation without medium or large size vessel significant stenosis or occlusion. Right vertebral artery is dominant and mildly ectatic. Narrowing distal left vertebral artery. Ectatic basilar artery with mild narrowing but without high-grade stenosis. No significant stenosis of the proximal posterior cerebral arteries. Fetal type contribution on right. Narrowing distal aspect of the posterior cerebral arteries bilaterally. Electronically Signed By: Genia Del M.D. On: 04/07/2015 15:36   Ct Angio Neck W/cm &/or Wo/cm  04/07/2015  CLINICAL DATA: 81 year old female with acute intracranial infarcts. Presented with nausea, vomiting and dizziness. Subsequent encounter. EXAM: CT ANGIOGRAPHY HEAD AND NECK TECHNIQUE: Multidetector CT imaging of the head and neck was performed using the standard protocol during bolus administration of intravenous contrast. Multiplanar CT image reconstructions and MIPs were obtained to evaluate the vascular anatomy. Carotid stenosis measurements (when applicable) are obtained utilizing NASCET criteria, using the distal internal carotid diameter as the denominator. CONTRAST: 16mL OMNIPAQUE IOHEXOL 350 MG/ML SOLN COMPARISON: 04/06/2015 brain MR. FINDINGS: CT HEAD Brain: Left thalamic, left hippocampus and right pontine/cerebellar infarct. No intracranial hemorrhage. Global atrophy without hydrocephalus. No intracranial enhancing lesion. Calvarium and skull base: Negative. Paranasal sinuses: Opacification sphenoid sinuses greater on the right. Orbits: Post lens replacement. CTA NECK Aortic arch: Ectatic 3 vessel aortic arch with plaque. Right carotid system: Ectatic common carotid artery. Plaque right carotid bifurcation with 52% diameter stenosis proximal right internal carotid artery. Ectatic cervical segment. Left carotid system: Ectatic common carotid artery. No significant stenosis  left carotid bifurcation or proximal left internal carotid artery. Ectatic cervical segment. Vertebral arteries:Plaque with mild narrowing proximal right vertebral artery. Mild narrowing proximal left vertebral artery. Moderate narrowing left vertebral artery C3-4 level. Skeleton: Cervical spondylotic changes most notable C4-5 thru C5-6. Other neck: No worrisome primary neck mass or lung apical lesion. CTA HEAD Anterior circulation: Internal carotid artery cavernous segment calcification with mild narrowing bilaterally. Anterior circulation without medium or large size vessel significant stenosis or occlusion. Posterior circulation: Right vertebral artery is dominant and mildly ectatic. Narrowing distal left vertebral artery. Ectatic basilar artery with mild narrowing but without high-grade stenosis. No significant stenosis of the proximal posterior cerebral arteries. Fetal type contribution on right. Narrowing distal aspect of the posterior cerebral arteries bilaterally. Venous sinuses: Negative Anatomic variants: Negative Delayed phase: Negative IMPRESSION: CT HEAD Left thalamic, left hippocampus and right pontine/cerebellar infarct. Opacification sphenoid sinuses greater on the right. CTA NECK Ectatic 3 vessel aortic arch with plaque. Plaque right carotid bifurcation with 52% diameter stenosis proximal right internal carotid artery. No significant stenosis left carotid bifurcation or proximal left internal carotid artery. Plaque with mild narrowing proximal right vertebral artery. Mild narrowing proximal left vertebral artery. Moderate narrowing left vertebral artery C3-4 level. CTA HEAD Internal carotid artery cavernous segment calcification with mild narrowing bilaterally. Anterior circulation without medium or large size vessel significant stenosis or occlusion. Right vertebral artery is dominant and mildly ectatic. Narrowing distal left vertebral artery. Ectatic basilar artery with  mild narrowing but without high-grade stenosis. No significant stenosis of the proximal posterior cerebral arteries. Fetal type contribution on right. Narrowing distal aspect of the posterior cerebral arteries bilaterally. Electronically Signed By: Genia Del M.D. On: 04/07/2015 15:36     Assessment/Plan: Diagnosis: Bilateral hemorrhagic and nonhemorrhagic infarcts 1. Does the need for close, 24 hr/day medical supervision in concert with the patient's rehab needs make it unreasonable for this patient to be served in a less intensive setting? Yes 2. Co-Morbidities requiring supervision/potential complications: Asthma 3. Due to safety, disease management, medication administration and patient education, does the patient require 24 hr/day rehab nursing? Yes 4. Does the patient require coordinated care of a physician, rehab nurse, PT (1-2 hrs/day, 5 days/week) and OT (1-2 hrs/day, 5 days/week) to address physical and functional deficits in the context of the above medical diagnosis(es)? Yes Addressing deficits in the following areas: balance, endurance, locomotion, strength, transferring, bathing, dressing, feeding, grooming, toileting and psychosocial support 5. Can the patient actively participate in an intensive therapy program of  at least 3 hrs of therapy per day at least 5 days per week? Yes 6. The potential for patient to make measurable gains while on inpatient rehab is excellent 7. Anticipated functional outcomes upon discharge from inpatient rehab are supervision and min assist with PT, modified independent and supervision with OT, n/a with SLP. 8. Estimated rehab length of stay to reach the above functional goals is: 13-17 days 9. Does the patient have adequate social supports and living environment to accommodate these discharge functional goals? Yes 10. Anticipated D/C setting: Home 11. Anticipated post D/C treatments: HH therapy and Home excercise program 12. Overall  Rehab/Functional Prognosis: excellent  RECOMMENDATIONS: This patient's condition is appropriate for continued rehabilitative care in the following setting: CIR Patient has agreed to participate in recommended program. Yes Note that insurance prior authorization may be required for reimbursement for recommended care.  Comment: Rehab Admissions Coordinator to follow up.   04/09/2015       Revision History     Date/Time User Provider Type Action   04/09/2015 10:56 AM Ankit Lorie Phenix, MD Physician Sign   04/09/2015 6:20 AM Cathlyn Parsons, PA-C Physician Assistant Pend   View Details Report       Routing History     Date/Time From To Method   04/09/2015 10:56 AM Ankit Lorie Phenix, MD Ankit Lorie Phenix, MD In [facility]   04/09/2015 10:56 AM Ankit Lorie Phenix, MD Hulan Fess, MD Fax

## 2015-04-10 NOTE — Progress Notes (Signed)
I have clarified insurance and contacted Dr. Nigel Bridgeman this morning with plans to admit pt to inpt rehab today. I will make the arrangements for this morning. RN CM and SW are aware. [phone]

## 2015-04-10 NOTE — Progress Notes (Signed)
Patient seen and examined, blood pressure 138/71, heart rate 78. Other vital signs stable. She is awake and alert. No focal deficits on exam. Discharge summary done on 10/3 still up-to-date. okay to discharge to CIR today. Discussed with patient, all questions answered.

## 2015-04-11 ENCOUNTER — Inpatient Hospital Stay (HOSPITAL_COMMUNITY): Payer: Medicare Other

## 2015-04-11 ENCOUNTER — Inpatient Hospital Stay (HOSPITAL_COMMUNITY): Payer: Medicare Other | Admitting: Occupational Therapy

## 2015-04-11 DIAGNOSIS — I638 Other cerebral infarction: Secondary | ICD-10-CM

## 2015-04-11 DIAGNOSIS — I69393 Ataxia following cerebral infarction: Secondary | ICD-10-CM

## 2015-04-11 LAB — URINALYSIS, ROUTINE W REFLEX MICROSCOPIC
Bilirubin Urine: NEGATIVE
GLUCOSE, UA: NEGATIVE mg/dL
Hgb urine dipstick: NEGATIVE
Ketones, ur: NEGATIVE mg/dL
LEUKOCYTES UA: NEGATIVE
Nitrite: NEGATIVE
PROTEIN: NEGATIVE mg/dL
Specific Gravity, Urine: 1.015 (ref 1.005–1.030)
UROBILINOGEN UA: 0.2 mg/dL (ref 0.0–1.0)
pH: 7 (ref 5.0–8.0)

## 2015-04-11 MED ORDER — SENNOSIDES-DOCUSATE SODIUM 8.6-50 MG PO TABS
2.0000 | ORAL_TABLET | Freq: Every day | ORAL | Status: DC
  Administered 2015-04-11 – 2015-04-12 (×2): 2 via ORAL
  Filled 2015-04-11 (×2): qty 2

## 2015-04-11 NOTE — Evaluation (Signed)
Occupational Therapy Assessment and Plan  Patient Details  Name: Darlene Murray MRN: [id number] Date of Birth: 06/30/1922  OT Diagnosis: flaccid hemiplegia and hemiparesis, hemiplegia affecting dominant side and muscle weakness (generalized) Rehab Potential: Rehab Potential (ACUTE ONLY): Excellent ELOS: 10-12 days   Today's Date: 04/11/2015 OT Individual Time: 0800-0900 OT Individual Time Calculation (min): 60 min     Problem List:  Patient Active Problem List   Diagnosis Date Noted  . Acute hemorrhagic infarction of brain ([place]) 04/10/2015  . Ataxia   . Cerebrovascular accident (CVA) due to embolism ([place]) 04/07/2015  . Weakness 04/06/2015  . Generalized weakness 04/06/2015  . Nausea with vomiting 04/06/2015  . Diastolic dysfunction 89/21/1941  . Severe persistent asthma in adult steroid dependent   . Allergic rhinitis   . Hyperlipidemia   . Venous insufficiency   . HNP (herniated nucleus pulposus), lumbar   . Left ventricular hypertrophy     Past Medical History:  Past Medical History  Diagnosis Date  . Blindness   . Asthmatic bronchitis   . Asthma   . Fibrocystic breast disease   . Allergic rhinitis   . Hyperlipidemia   . Venous insufficiency     lower extremities  . HNP (herniated nucleus pulposus), lumbar     L4-L5  . Left ventricular hypertrophy 2008    mild  . Antibiotic-associated diarrhea    Past Surgical History: No past surgical history on file.  Assessment & Plan Clinical Impression: Patient is a 81 y.o. year old female with recent admission to the hospital on 04/06/2015 with nausea and vomiting, dizziness, decrease in balance and generalized weakness as well as some mild slurred speech. MRI imaging revealed acute moderate sized nonhemorrhagic infarct posterior right upper and mid pons and adjacent right cerebellum. Acute tiny nonhemorrhagic infarct left thalamic infarct. Acute nonhemorrhagic left occipital lobe infarct.  .  Patient transferred to CIR on  04/10/2015 .    Patient currently requires mod with basic self-care skills secondary to muscle weakness, impaired timing and sequencing, ataxia and decreased coordination and decreased standing balance, decreased postural control, hemiplegia and decreased balance strategies.  Prior to hospitalization, patient could complete ADLs with independent .  Patient will benefit from skilled intervention to decrease level of assist with basic self-care skills and increase independence with basic self-care skills prior to discharge home with care partner.  Anticipate patient will require 24 hour supervision and follow up outpatient.  OT - End of Session Activity Tolerance: Decreased this session Endurance Deficit: Yes OT Assessment Rehab Potential (ACUTE ONLY): Excellent OT Patient demonstrates impairments in the following area(s): Balance;Motor;Pain OT Basic ADL's Functional Problem(s): Grooming;Bathing;Dressing;Toileting OT Advanced ADL's Functional Problem(s): Simple Meal Preparation;Laundry OT Transfers Functional Problem(s): Toilet;Tub/Shower OT Additional Impairment(s): Fuctional Use of Upper Extremity OT Plan OT Intensity: Minimum of 1-2 x/day, 45 to 90 minutes OT Frequency: 5 out of 7 days OT Duration/Estimated Length of Stay: 10-12 days OT Treatment/Interventions: Balance/vestibular training;DME/adaptive equipment instruction;Patient/family education;Discharge planning;Therapeutic Activities;UE/LE Coordination activities;UE/LE Strength taining/ROM;Self Care/advanced ADL retraining;Neuromuscular re-education;Therapeutic Exercise;Community reintegration;Functional mobility training OT Self Feeding Anticipated Outcome(s): independent OT Basic Self-Care Anticipated Outcome(s): supervision OT Toileting Anticipated Outcome(s): supervision OT Bathroom Transfers Anticipated Outcome(s): supervision OT Recommendation Patient destination: Home Follow Up Recommendations: Outpatient OT Equipment  Recommended: To be determined   Skilled Therapeutic Intervention Pt worked on bathing and dressing at the sink during session.  Pt with increased LOB posteriorly during transfer to the wheelchair as well as with standing during LB selfcare.  Pt with good awareness of balance  deficits.  She did demonstrate slight difficulty reaching feet for dressing and bathing as she cannot cross one LE over the other.  She did reach down to her feet however with increased time and min guard assist.  Pt left in wheelchair at end of session.  She was able to correctly identify method for use of the call button to notify nursing.    OT Evaluation Precautions/Restrictions  Precautions Precautions: Fall Restrictions Weight Bearing Restrictions: No  Pain Pain Assessment Pain Assessment: No/denies pain Home Living/Prior Functioning Home Living Family/patient expects to be discharged to:: Private residence Living Arrangements: Spouse/significant other Available Help at Discharge: Family, Available 24 hours/day Type of Home: House Home Access: Level entry Home Layout: One level Bathroom Shower/Tub: Multimedia programmer: Handicapped height Bathroom Accessibility: Yes  Lives With: Spouse IADL History Homemaking Responsibilities: Yes Meal Prep Responsibility: Therapist, occupational Responsibility: Primary Homemaking Comments: Husband also assists with meal prep tasks Current License: Yes Occupation: Retired Prior Function Level of Independence: Independent with basic ADLs, Independent with homemaking with ambulation, Independent with gait, Independent with transfers  Able to Take Stairs?: Yes Driving: Yes Vocation: Retired Leisure: Hobbies-yes (Comment) Comments: drives, active on numerous organizations,computer ADL  See Function section of chart  Vision/Perception  Vision- History Baseline Vision/History: Wears glasses Wears Glasses: At all times Patient Visual Report: No change from  baseline Vision- Assessment Vision Assessment?: Yes Eye Alignment: Impaired (comment) Ocular Range of Motion: Within Functional Limits Alignment/Gaze Preference: Head tilt (slight head tilt to the right at times, especially with tracking) Tracking/Visual Pursuits: Right eye does not track laterally;Decreased smoothness of vertical tracking;Other (comment) (right eye with decreased superior movement as well) Convergence: Impaired (comment) Visual Fields: Other (comment) (possible right field deficit but will need further testing secondary to not following directions for testing consistently) Additional Comments: 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   Cognition Overall Cognitive Status: Within Functional Limits for tasks assessed Arousal/Alertness: Awake/alert Orientation Level: Person;Place;Situation Person: Oriented Place: Oriented Situation: Oriented Year: 2016 Month: October Day of Week: Correct Memory: Appears intact Immediate Memory Recall: Sock;Blue;Bed Memory Recall: Sock;Blue;Bed Memory Recall Sock: Without Cue Memory Recall Blue: Without Cue Memory Recall Bed: With Cue Attention: Sustained Sustained Attention: Appears intact Problem Solving: Appears intact Safety/Judgment: Appears intact Sensation Sensation Light Touch: Appears Intact Stereognosis: Appears Intact Hot/Cold: Appears Intact Proprioception: Appears Intact Additional Comments: sensation intact in BUEs Coordination Gross Motor Movements are Fluid and Coordinated: No Fine Motor Movements are Fluid and Coordinated: No Coordination and Movement Description: Pt with slight increased ataxia in the RUE with finger to nose.  Heel Shin Test: reduced speed, excursion and accuracy LLE Motor  Motor Motor: Ataxia Motor - Skilled Clinical Observations: Pt currently with RUE and RLE mild ataxia.   Mobility  Bed Mobility Bed Mobility: Supine to Sit Supine to Sit: 5:  Supervision;With rails;HOB flat Supine to Sit Details: Verbal cues for technique Transfers Transfers: Sit to Stand;Stand to Sit Sit to Stand: 4: Min assist;From bed;With upper extremity assist;With armrests Sit to Stand Details: Verbal cues for technique;Manual facilitation for weight shifting Stand to Sit: 4: Min assist;With upper extremity assist;To toilet;To chair/3-in-1 Stand to Sit Details (indicate cue type and reason): Verbal cues for technique;Manual facilitation for weight shifting  Trunk/Postural Assessment  Cervical Assessment Cervical Assessment: Exceptions to [facility] (slightly forward head) Thoracic Assessment Thoracic Assessment: Exceptions to [facility] (thoracic flexion in standing) Lumbar Assessment Lumbar Assessment: Exceptions to [facility] (slight posterior pelvic tilt) Postural Control Postural Control: Deficits  on evaluation Protective Responses: increased LOB posteriorly in standing when managing clothing for LB dressing.  Balance Balance Balance Assessed: Yes Dynamic Sitting Balance Dynamic Sitting - Level of Assistance: 5: Stand by assistance Static Standing Balance Static Standing - Balance Support: Right upper extremity supported;Left upper extremity supported Static Standing - Level of Assistance: 5: Stand by assistance Dynamic Standing Balance Dynamic Standing - Balance Support: Right upper extremity supported;Left upper extremity supported Dynamic Standing - Level of Assistance: 3: Mod assist Extremity/Trunk Assessment RUE Assessment RUE Assessment: Exceptions to [facility] RUE Strength RUE Overall Strength Comments: shoulder AROM for flexion 0-120 degrees, strength 3-/5, elbow AROM WFLS with strength 4/5, grip strength 4/5 with pt being able to oppose thumb to all digits. LUE Assessment LUE Assessment: Within Functional Limits   See Function Navigator for Current Functional Status.   Refer to Care Plan for Long Term Goals  Recommendations for other services:  None  Discharge Criteria: Patient will be discharged from OT if patient refuses treatment 3 consecutive times without medical reason, if treatment goals not met, if there is a change in medical status, if patient makes no progress towards goals or if patient is discharged from hospital.  The above assessment, treatment plan, treatment alternatives and goals were discussed and mutually agreed upon: by patient and by family  Skyeler Smola OTR/L 04/11/2015, 11:00 AM

## 2015-04-11 NOTE — Progress Notes (Signed)
Social work  Social Work Assessment and Plan  Patient Details  Name: Darlene Murray MRN: [id number] Date of Birth: Jan 24, 1922  Today's Date: 04/11/2015  Problem List:  Patient Active Problem List   Diagnosis Date Noted  . Acute hemorrhagic infarction of brain ([place]) 04/10/2015  . Ataxia   . Cerebrovascular accident (CVA) due to embolism ([place]) 04/07/2015  . Weakness 04/06/2015  . Generalized weakness 04/06/2015  . Nausea with vomiting 04/06/2015  . Diastolic dysfunction 81/44/8185  . Severe persistent asthma in adult steroid dependent   . Allergic rhinitis   . Hyperlipidemia   . Venous insufficiency   . HNP (herniated nucleus pulposus), lumbar   . Left ventricular hypertrophy    Past Medical History:  Past Medical History  Diagnosis Date  . Blindness   . Asthmatic bronchitis   . Asthma   . Fibrocystic breast disease   . Allergic rhinitis   . Hyperlipidemia   . Venous insufficiency     lower extremities  . HNP (herniated nucleus pulposus), lumbar     L4-L5  . Left ventricular hypertrophy 2008    mild  . Antibiotic-associated diarrhea    Past Surgical History: No past surgical history on file. Social History:  reports that she has never smoked. She has never used smokeless tobacco. She reports that she drinks alcohol. She reports that she does not use illicit drugs.  Family / Support Systems Marital Status: Married How Long?: 10 years Patient Roles: Spouse, Parent, Volunteer Spouse/Significant Other: Darlene Murray  [phone] Children: Darlene Murray  [phone] Anticipated Caregiver: husband and son Ability/Limitations of Caregiver: no limitations both in good health Caregiver Availability: 24/7 Family Dynamics: Pt moved down here from [place] to be closer to her only child-son. She has alwasy been very active and remained so until this. She had stopped writing educational grants, but used the computer daily and drove anywhere she wanted to go. Her husband  is also active.  Social History Preferred language: English Religion: Baptist Cultural Background: No issues Education: Secretary/administrator educated Read: Yes Write: Yes Employment Status: Retired Freight forwarder Issues: No issues Guardian/Conservator: None-according to MD pt is capable of making her own decisions while here.   Abuse/Neglect Physical Abuse: Denies Verbal Abuse: Denies Sexual Abuse: Denies Exploitation of patient/patient's resources: Denies Self-Neglect: Denies  Emotional Status Pt's affect, behavior adn adjustment status: Pt has alwasy been very active and son would like for her to return to her activities, made aware this may take time to be able to do this, but not right from the hospital will this happen.  He is here to observe pt in therapies. She wants to be able to take care of herself at the least by the time she leaves here. Recent Psychosocial Issues: Feels like she has been healthy, asthma and chronic sterioids but was managed Pyschiatric History: No history and feels she is doing well and no intervention is needed. Son feels she is doing well and managing. Both are very private people and not ones to share their concerns with others. Will monitor while here and intervene if needed. Substance Abuse History: No issues  Patient / Family Perceptions, Expectations & Goals Pt/Family understanding of illness & functional limitations: Pt and son have a good understanding of her stroke and are still wondering why this happened. Will ask MD to discuss with both of them. Informed MD states a 30 day monitor to be scheduled after discharge, this may have contributed to her strokes. Son asks many questions and pt  lets him talk for her at times. Premorbid pt/family roles/activities: Wife, Mother, Psychologist, occupational, church member, home owner, etc Anticipated changes in roles/activities/participation: resume Pt/family expectations/goals: Pt states: " I want to be able to take care of  myself before I leave here."  Son states: " I hope she can return to all of her activities when she leaves here."  [employer]: None Premorbid Home Care/DME Agencies: None Transportation available at discharge: Husband and son Resource referrals recommended: Support group (specify)  Discharge Planning Living Arrangements: Spouse/significant other Support Systems: Spouse/significant other, Children, Water engineer, Social worker community Type of Residence: Private residence Insurance underwriter Resources: [employer], Multimedia programmer (specify) ([employer]) Financial Resources: Radio broadcast assistant Screen Referred: No Living Expenses: Own Money Management: Spouse, Patient Does the patient have any problems obtaining your medications?: No Home Management: Both Patient/Family Preliminary Plans: Return home with husband who is able to provide care to pt upon discharge. Son can be in and out but works. He is here today to observe in therapies. Both aware pt may require 24 hr care upon discharge and husband can provide this. Social Work Anticipated Follow Up Needs: HH/OP, Support Group  Clinical Impression Pleasant female who is doing well with her strokes and deficits. She should do well here and reach supervision level, husband can provide supervision level and son is available to assist. Questions as to what caused her stroke, encouraged her to talk with MD's and informed of 30 day monitor at discharge. Await team's evaluations and work on discharge plans.  Darlene Murray 04/11/2015, 1:53 PM

## 2015-04-11 NOTE — Progress Notes (Signed)
Pt sat on BSC and voided <30 ml. PVR volume of 411 ml. I&O cath for 450ml of yellow/cloudy, malodorous urine. UA sent to lab.

## 2015-04-11 NOTE — Progress Notes (Signed)
Physical Therapy Assessment and Plan  Patient Details  Name: Darlene Murray MRN: [id number] Date of Birth: 1921-09-08  PT Diagnosis: Ataxic gait and Hemiparesis dominant Rehab Potential: Good ELOS: 10-12   Today's Date: 04/11/2015 PT Individual Time: 6967-8938 ; 1017-5102  PT Individual Time Calculation (min): 65 min  , 65 min  Problem List:  Patient Active Problem List   Diagnosis Date Noted  . Acute hemorrhagic infarction of brain ([place]) 04/10/2015  . Ataxia   . Cerebrovascular accident (CVA) due to embolism ([place]) 04/07/2015  . Weakness 04/06/2015  . Generalized weakness 04/06/2015  . Nausea with vomiting 04/06/2015  . Diastolic dysfunction 58/52/7782  . Severe persistent asthma in adult steroid dependent   . Allergic rhinitis   . Hyperlipidemia   . Venous insufficiency   . HNP (herniated nucleus pulposus), lumbar   . Left ventricular hypertrophy     Past Medical History:  Past Medical History  Diagnosis Date  . Blindness   . Asthmatic bronchitis   . Asthma   . Fibrocystic breast disease   . Allergic rhinitis   . Hyperlipidemia   . Venous insufficiency     lower extremities  . HNP (herniated nucleus pulposus), lumbar     L4-L5  . Left ventricular hypertrophy 2008    mild  . Antibiotic-associated diarrhea    Past Surgical History: No past surgical history on file.  Assessment & Plan Clinical Impression:Darlene Murray is a 81 y.o. Darlene Murray is a 81 y.o. right handed female with history of steroid-dependent for asthma and hyperlipidemia. Patient lives with spouse and was independent prior to admission. Spouse is retired and able to care for pt on discharge. Presented 04/06/2015 with nausea and vomiting, dizziness, decrease in balance and generalized weakness as well as some mild slurred speech. MRI imaging revealed acute moderate sized nonhemorrhagic infarct posterior right upper and mid pons and adjacent right cerebellum. Acute tiny nonhemorrhagic  infarct left thalamic infarct. Acute nonhemorrhagic left occipital lobe infarct. CTA of head and neck with no significant stenosis or occlusion. Echocardiogram results are pending.  Tolerating a regular consistency diet, but pt and family state she is not eating well.  Patient transferred to CIR on 04/10/2015 .   Patient currently requires mod with mobility secondary to impaired timing and sequencing, unbalanced muscle activation, ataxia and decreased coordination.  Prior to hospitalization, patient was independent  with mobility and lived with Spouse in a House home.  Home access is  Level entry.  Patient will benefit from skilled PT intervention to maximize safe functional mobility, minimize fall risk and decrease caregiver burden for planned discharge home with 24 hour supervision.  Anticipate patient will benefit from follow up [facility] at discharge.  PT - End of Session Activity Tolerance: Tolerates 10 - 20 min activity with multiple rests Endurance Deficit: Yes Endurance Deficit Description: limited tolerance of unsupported sitting and standing PT Assessment Rehab Potential (ACUTE/IP ONLY): Good PT Patient demonstrates impairments in the following area(s): Balance;Endurance;Motor PT Transfers Functional Problem(s): Bed Mobility;Bed to Chair;Car;Furniture PT Locomotion Functional Problem(s): Ambulation;Wheelchair Mobility;Stairs PT Plan PT Intensity: Minimum of 1-2 x/day ,45 to 90 minutes PT Frequency: 5 out of 7 days PT Duration Estimated Length of Stay: 10-12 PT Treatment/Interventions: Ambulation/gait training;Balance/vestibular training;Discharge planning;Community reintegration;DME/adaptive equipment instruction;Functional mobility training;Patient/family education;Neuromuscular re-education;Psychosocial support;Splinting/orthotics;Therapeutic Exercise;Therapeutic Activities;Stair training;UE/LE Strength taining/ROM;UE/LE Coordination activities;Visual/perceptual  remediation/compensation;Wheelchair propulsion/positioning PT Transfers Anticipated Outcome(s): supervision overall PT Locomotion Anticipated Outcome(s): supervision overall with gait x 150', up/down 12 steps with 2 rails  PT Recommendation Follow  Up Recommendations: Home health PT Patient destination: Home Equipment Recommended: Rolling walker with 5" wheels  Skilled Therapeutic Intervention  tx 1:Eval completed.  LTGs discussed with pt and her son.  Pt stated she felt woozy, but vitals WNLs.  Pt denies vertigo or dizziness, but is very reluctant to move sit> stand, and states she feels she might fall. At end of session, PT assisted pt into recliner, and all needs left within reach.  Son present.   tx 2: Husband in attendance to observe.  Gait with RW x 90' with min/mod assist for balance, wt shifting, steering RW.  Balance retraining standing on wedge statically and during slight L><R wt shifting.  Transfer training via forward wt shifting by leaning forward to lower bench,  to manipulate numbered discs, using R hand only.  R hand forced use in unsupported sitting to manipulate small objects on tabletop in front of her; pt successful picking up and placing small items in jar to her L,  with her r hand. W/c propulsion for activity tolerance, and bil UE coordination, x 58' with supervision.  PT assisted pt to her recliner without AD, mod assist. All needs left within reach, and husband present.  PT Evaluation Precautions/Restrictions- falls     Vital Signs Therapy Vitals BP 122/63, HR 89; O2 sats on room air 97% Seated, after c/o of lightheadedness Pain Pain Assessment Pain Assessment: No/denies pain Home Living/Prior Functioning Home Living Living Arrangements: Spouse/significant other Vision/Perception  Vision - Assessment Eye Alignment: Impaired (comment) Alignment/Gaze Preference: Head tilt  Pt has congenital R vision problem- see OT eval Cognition Overall Cognitive Status:  Within Functional Limits for tasks assessed Arousal/Alertness: Awake/alert Attention: Sustained Sustained Attention: Appears intact Memory: Appears intact Problem Solving: Appears intact Safety/Judgment: Appears intact Sensation Sensation Light Touch: Appears Intact Stereognosis: Appears Intact Hot/Cold: Appears Intact Proprioception: Appears Intact Additional Comments: sensation intact in BUEs Coordination Gross Motor Movements are Fluid and Coordinated: No Fine Motor Movements are Fluid and Coordinated: No Coordination and Movement Description: Pt with slight increased ataxia in the RUE with finger to nose.  Heel Shin Test: reduced speed, excursion and accuracy LLE Motor  Motor Motor: Ataxia Motor - Skilled Clinical Observations: Pt currently with RUE and RLE mild ataxia.    Mobility Bed Mobility Bed Mobility: Supine to Sit Supine to Sit: 5: Supervision;With rails;HOB flat Supine to Sit Details: Verbal cues for technique Transfers Transfers: Yes Sit to Stand: 4: Min assist;From bed;With upper extremity assist;With armrests Sit to Stand Details: Manual facilitation for weight shifting;Verbal cues for technique Stand to Sit: 3: Mod assist;With armrests Stand to Sit Details (indicate cue type and reason): Verbal cues for technique;Manual facilitation for weight shifting Stand Pivot Transfers: 3: Mod assist Stand Pivot Transfer Details: Manual facilitation for weight shifting;Verbal cues for technique;Verbal cues for sequencing Locomotion  Ambulation Ambulation: Yes Ambulation/Gait Assistance: 3: Mod assist Ambulation Distance (Feet): 15 Feet Assistive device: None Ambulation/Gait Assistance Details: Verbal cues for gait pattern;Manual facilitation for weight bearing;Manual facilitation for placement;Manual facilitation for weight shifting Gait Gait: Yes Gait Pattern: Impaired Gait Pattern: Trunk flexed;Trunk rotated posteriorly on right;Ataxic;Right flexed knee in  stance;Left flexed knee in stance;Decreased stride length;Step-through pattern Stairs / Additional Locomotion Stairs: Yes Stair Management Technique: Two rails Number of Stairs: 12 Height of Stairs: 3 (8 , 3" high; 4, 6" high) Wheelchair Mobility Wheelchair Mobility: Yes Wheelchair Assistance: 4: Advertising account executive Details: Verbal cues for technique;Manual facilitation for Horticulturist, commercial: Both upper extremities Wheelchair Parts Management: Needs assistance  Distance: 30  Trunk/Postural Assessment  Cervical Assessment Cervical Assessment: Within Functional Limits (forward head) Thoracic Assessment Thoracic Assessment: Within Functional Limits (flexion in standing) Lumbar Assessment Lumbar Assessment: Exceptions to WFL (L trunk shortening in sitting) Postural Control Postural Control: Deficits on evaluation Protective Responses: delayed and insufficient during external perturbations  Balance Balance Balance Assessed: Yes Dynamic Sitting Balance Dynamic Sitting - Level of Assistance: 5: Stand by assistance Static Standing Balance Static Standing - Balance Support: Right upper extremity supported;Left upper extremity supported Static Standing - Level of Assistance: 5: Stand by assistance Dynamic Standing Balance Dynamic Standing - Balance Support: Right upper extremity supported;Left upper extremity supported Dynamic Standing - Level of Assistance: 3: Mod assist Extremity Assessment  RUE Assessment RUE Assessment: Exceptions to [facility] RUE Strength RUE Overall Strength Comments: shoulder AROM for flexion 0-120 degrees, strength 3-/5, elbow AROM WFLS with strength 4/5, grip strength 4/5 with pt being able to oppose thumb to all digits. LUE Assessment LUE Assessment: Within Functional Limits RLE Assessment RLE Assessment: Within Functional Limits LLE Assessment LLE Assessment: Within Functional Limits   See Function Navigator for Current Functional  Status.   Refer to Care Plan for Long Term Goals  Recommendations for other services: None  Discharge Criteria: Patient will be discharged from PT if patient refuses treatment 3 consecutive times without medical reason, if treatment goals not met, if there is a change in medical status, if patient makes no progress towards goals or if patient is discharged from hospital.  The above assessment, treatment plan, treatment alternatives and goals were discussed and mutually agreed upon: by patient  Kynadee Dam 04/11/2015, 4:57 PM

## 2015-04-11 NOTE — Progress Notes (Signed)
Nutrition Brief Note  Patient identified on the Malnutrition Screening Tool (MST) Report  Wt Readings from Last 15 Encounters:  04/11/15 166 lb 3.2 oz (75.388 kg)  04/06/15 177 lb 3.2 oz (80.377 kg)  02/28/15 176 lb (79.833 kg)  12/28/14 180 lb (81.647 kg)  12/16/14 181 lb (82.101 kg)  11/23/14 188 lb (85.276 kg)  10/16/14 187 lb (84.823 kg)  08/17/14 184 lb (83.462 kg)  06/28/14 181 lb (82.101 kg)  03/30/14 187 lb (84.823 kg)  12/08/13 187 lb (84.823 kg)  08/16/13 194 lb (87.998 kg)  02/24/13 180 lb (81.647 kg)  11/25/12 192 lb (87.091 kg)  10/26/12 194 lb (87.998 kg)    Body mass index is 30.39 kg/(m^2). Patient meets criteria for class I obesity based on current BMI.   Current diet order is regular, patient is consuming approximately 100% of meals at this time. Pt reports eating well currently and PTA with no other difficulties. Pt with no observed significant fat or muscle mass loss. Labs and medications reviewed.   No nutrition interventions warranted at this time. If nutrition issues arise, please consult RD.   Corrin Parker, MS, RD, LDN Pager # [phone] After hours/ weekend pager # [phone]

## 2015-04-11 NOTE — Progress Notes (Signed)
Social Work Patient ID: Darlene Murray, female   DOB: August 10, 1921, 81 y.o.   MRN: [id number] Spoke with pt and son to inform team's goals of supervision level and discharge target 10/14.  Both want to see how she is doing, since only had one therapy a piece this am. Pt did have a bowel movement which she feels better with since it had been several days since she had one. Will work on discharge plans, husband to be here tomorrow.

## 2015-04-11 NOTE — Progress Notes (Signed)
81 y.o. Darlene Murray is a 81 y.o. right handed female with history of steroid-dependent for asthma and hyperlipidemia. Patient lives with spouse and was independent prior to admission. Spouse is retired and able to care for pt on discharge. Presented 04/06/2015 with nausea and vomiting, dizziness, decrease in balance and generalized weakness as well as some mild slurred speech. MRI imaging revealed acute moderate sized nonhemorrhagic infarct posterior right upper and mid pons and adjacent right cerebellum. Acute tiny nonhemorrhagic infarct left thalamic infarct. Acute nonhemorrhagic left occipital lobe infarct. CTA of head and neck with no significant stenosis or occlusion. Echocardiogram results are pending. Patient did not receive TPA. Neurology consulted maintain on aspirin 325 mg daily for CVA prophylaxis  Subjective/Complaints:  Slept ok. No pain  ROS-  + constip, neg for bladder issues , no SOB   Objective: Vital Signs: Blood pressure 126/62, pulse 70, temperature 98 F (36.7 C), temperature source Oral, resp. rate 19, SpO2 99 %. No results found. Results for orders placed or performed during the hospital encounter of 04/10/15 (from the past 72 hour(s))  CBC WITH DIFFERENTIAL     Status: None   Collection Time: 04/10/15  3:51 PM  Result Value Ref Range   WBC 7.6 4.0 - 10.5 K/uL   RBC 4.77 3.87 - 5.11 MIL/uL   Hemoglobin 13.9 12.0 - 15.0 g/dL   HCT 42.2 36.0 - 46.0 %   MCV 88.5 78.0 - 100.0 fL   MCH 29.1 26.0 - 34.0 pg   MCHC 32.9 30.0 - 36.0 g/dL   RDW 15.4 11.5 - 15.5 %   Platelets 250 150 - 400 K/uL   Neutrophils Relative % 71 %   Neutro Abs 5.4 1.7 - 7.7 K/uL   Lymphocytes Relative 14 %   Lymphs Abs 1.1 0.7 - 4.0 K/uL   Monocytes Relative 10 %   Monocytes Absolute 0.8 0.1 - 1.0 K/uL   Eosinophils Relative 5 %   Eosinophils Absolute 0.4 0.0 - 0.7 K/uL   Basophils Relative 0 %   Basophils Absolute 0.0 0.0 - 0.1 K/uL  Comprehensive metabolic panel     Status:  Abnormal   Collection Time: 04/10/15  3:51 PM  Result Value Ref Range   Sodium 139 135 - 145 mmol/L   Potassium 4.4 3.5 - 5.1 mmol/L   Chloride 104 101 - 111 mmol/L   CO2 27 22 - 32 mmol/L   Glucose, Bld 127 (H) 65 - 99 mg/dL   BUN 15 6 - 20 mg/dL   Creatinine, Ser 1.06 (H) 0.44 - 1.00 mg/dL   Calcium 9.3 8.9 - 10.3 mg/dL   Total Protein 6.5 6.5 - 8.1 g/dL   Albumin 3.0 (L) 3.5 - 5.0 g/dL   AST 26 15 - 41 U/L   ALT 15 14 - 54 U/L   Alkaline Phosphatase 52 38 - 126 U/L   Total Bilirubin 1.2 0.3 - 1.2 mg/dL   GFR calc non Af Amer 44 (L) >60 mL/min   GFR calc Af Amer 52 (L) >60 mL/min    Comment: (NOTE) The eGFR has been calculated using the CKD EPI equation. This calculation has not been validated in all clinical situations. eGFR's persistently <60 mL/min signify possible Chronic Kidney Disease.    Anion gap 8 5 - 15  Glucose, capillary     Status: Abnormal   Collection Time: 04/10/15  5:00 PM  Result Value Ref Range   Glucose-Capillary 112 (H) 65 - 99 mg/dL  Urine culture  Status: None (Preliminary result)   Collection Time: 04/10/15  5:28 PM  Result Value Ref Range   Specimen Description URINE, CATHETERIZED    Special Requests NONE    Culture PENDING    Report Status PENDING       General: No acute distress Mood and affect are appropriate Heart: Regular rate and rhythm no rubs murmurs or extra sounds Lungs: Clear to auscultation, breathing unlabored, no rales or wheezes Abdomen: Positive bowel sounds, soft nontender to palpation, nondistended Extremities: No clubbing, cyanosis, or edema Skin: No evidence of breakdown, no evidence of rash Neurologic: Cranial nerves II through XII intact, motor strength is 5/5 in bilateral deltoid, bicep, tricep, grip, hip flexor, knee extensors, ankle dorsiflexor and plantar flexor Sensory exam normal sensation to light touch and proprioception in bilateral upper and lower extremities Cerebellar exam normal finger to nose to finger  on left , mild dysmetria on right, some left visual neglect on confrontation but not cosistent, no evidence of nystamus Musculoskeletal: Full range of motion in all 4 extremities. No joint swelling   Assessment/Plan: 1. 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 right ponto-cerebellar and left occipital  which require 3+ hours per day of interdisciplinary therapy in a comprehensive inpatient rehab setting. Physiatrist is providing close team supervision and 24 hour management of active medical problems listed below. Physiatrist and rehab team continue to assess barriers to discharge/monitor patient progress toward functional and medical goals. FIM:       Function - Toileting Toileting activity did not occur: No continent bowel/bladder event           Function - Comprehension Comprehension: Auditory Comprehension assist level: Understands complex 90% of the time/cues 10% of the time  Function - Expression Expression: Verbal Expression assist level: Expresses complex 90% of the time/cues < 10% of the time  Function - Social Interaction Social Interaction assist level: Interacts appropriately 90% of the time - Needs monitoring or encouragement for participation or interaction.     Function - Memory Memory assist level: More than reasonable amount of time Patient normally able to recall (first 3 days only): Current season, Location of own room, Staff names and faces, That he or she is in a hospital Medical Problem List and Plan: 1. Functional deficits secondary to bilateral hemorrhagic  infarct suspect embolic right ponto-cerebellar and left occipital 2.  DVT Prophylaxis/Anticoagulation: SCDs. Monitor for any signs of DVT 3. Pain Management: Tylenol as needed 4. Steroid-dependent asthma. Continue prednisone as well as Dulera 5. Neuropsych: This patient is capable of making decisions on her own behalf. 6. Skin/Wound Care: Routine skin  checks 7. Fluids/Electrolytes/Nutrition: Routine I&O with follow-up bmet normal 8. Hyperlipidemia. Pravachol 9.  Hypoalbuminemia will add benprotein 10.  Constipation adjust laxatives LOS (Days) 1 A FACE TO FACE EVALUATION WAS PERFORMED  KIRSTEINS,ANDREW E 04/11/2015, 7:12 AM    

## 2015-04-11 NOTE — Progress Notes (Signed)
Patient information reviewed and entered into eRehab system by Geoge Lawrance, RN, CRRN, PPS Coordinator.  Information including medical coding and functional independence measure will be reviewed and updated through discharge.     Per nursing patient was given "Data Collection Information Summary for Patients in Inpatient Rehabilitation Facilities with attached "Privacy Act Statement-Health Care Records" upon admission.  

## 2015-04-11 NOTE — Progress Notes (Signed)
Occupational Therapy Session Note  Patient Details  Name: Darlene Murray MRN: [id number] Date of Birth: 1921/07/10  Today's Date: 04/11/2015 OT Individual Time: 1300-1331 OT Individual Time Calculation (min): 31 min    Short Term Goals: Week 1:  OT Short Term Goal 1 (Week 1): Pt will perform toilet transfers with RW and 3:1 with supervision. OT Short Term Goal 2 (Week 1): Pt will perform shower transfers to walk-in shower with supervision. OT Short Term Goal 3 (Week 1): Pt will perform LB dressing and bathing with supervision sit to stand. OT Short Term Goal 4 (Week 1): Pt will perform RUE shoulder exercises with supervision following handout.    Skilled Therapeutic Interventions/Progress Updates:    Pt's husband present for session.  Discussed overall goals and expected LOS.  Pt worked on donning socks and shoes that her husband purchased as most of her shoes from home are slide in without a back.  Mod assist needed for donning ankle high socks.  Husband will bring in slightly larger ones next visit.  She was able to donn both slip on shoes with back as well as velcro shoes with supervision.  Performed functional mobility and toilet transfers with use of the RW.  Max demonstrational cueing for use as pt stands too far back from the walker.  Pt voicing out with mobility as if she was in pain but reported no pain when questioned.  Transferred to 3:1 over toilet at end of session.  Pt left sitting on toilet with husband present and instructions to use call button before getting up.   Therapy Documentation Precautions:  Precautions Precautions: Fall Restrictions Weight Bearing Restrictions: No  Pain:  No report of pain during session  ADL: See Function Navigator for Current Functional Status.   Therapy/Group: Individual Therapy  Jamiria Langill OTR/L 04/11/2015, 7:48 PM

## 2015-04-11 NOTE — Care Management Note (Signed)
Inpatient [facility] Individual Statement of Services  Patient Name:  Darlene Murray  Date:  04/11/2015  Welcome to the [facility].  Our goal is to provide you with an individualized program based on your diagnosis and situation, designed to meet your specific needs.  With this comprehensive rehabilitation program, you will be expected to participate in at least 3 hours of rehabilitation therapies Monday-Friday, with modified therapy programming on the weekends.  Your rehabilitation program will include the following services:  Physical Therapy (PT), Occupational Therapy (OT),  24 hour per day rehabilitation nursing, Therapeutic Recreaction (TR), Neuropsychology, Case Management (Social Worker), Rehabilitation Medicine, Nutrition Services and Pharmacy Services  Weekly team conferences will be held on Wednesday to discuss your progress.  Your Social Worker will talk with you frequently to get your input and to update you on team discussions.  Team conferences with you and your family in attendance may also be held.  Expected length of stay: 10-12 days  Overall anticipated outcome: supervision level-some mod/i level  Depending on your progress and recovery, your program may change. Your Social Worker will coordinate services and will keep you informed of any changes. Your Social Worker's name and contact numbers are listed  below.  The following services may also be recommended but are not provided by the [facility] will be made to provide these services after discharge if needed.  Arrangements include referral to agencies that provide these services.  Your insurance has been verified to be:  Medicare & [employer] Your primary doctor is:  Hulan Fess  Pertinent information will be shared with your doctor and your insurance  company.  Social Worker:  Ovidio Kin, [place] or ([phone]  Information discussed with and copy given to patient by: [name], 04/11/2015, 9:26 AM

## 2015-04-11 NOTE — Patient Care Conference (Signed)
Inpatient RehabilitationTeam Conference and Plan of Care Update Date: 04/11/2015   Time: 110;55 AM    Patient Name: Darlene Murray      Medical Record Number: [id number]  Date of Birth: [birth year] Sex: Female         Room/Bed: 4M03C/4M03C-01 Payor Info: Payor: MEDICARE / Plan: MEDICARE PART A AND B / Product Type: *No Product type* /    Admitting Diagnosis: b Cvas   Admit Date/Time:  04/10/2015  2:13 PM Admission Comments: No comment available   Primary Diagnosis:  Ataxia Principal Problem: Ataxia  Patient Active Problem List   Diagnosis Date Noted  . Acute hemorrhagic infarction of brain ([place]) 04/10/2015  . Ataxia   . Cerebrovascular accident (CVA) due to embolism ([place]) 04/07/2015  . Weakness 04/06/2015  . Generalized weakness 04/06/2015  . Nausea with vomiting 04/06/2015  . Diastolic dysfunction 99/35/7017  . Severe persistent asthma in adult steroid dependent   . Allergic rhinitis   . Hyperlipidemia   . Venous insufficiency   . HNP (herniated nucleus pulposus), lumbar   . Left ventricular hypertrophy     Expected Discharge Date: Expected Discharge Date: 04/20/15  Team Members Present: Physician leading conference: Dr. Alysia Penna Social Worker Present: Ovidio Kin, LCSW Nurse Present: Heather Roberts, RN PT Present: Georjean Mode, PT OT Present: Clyda Greener, OT SLP Present: Gunnar Fusi, SLP PPS Coordinator present : Daiva Nakayama, RN, CRRN     Current Status/Progress Goal Weekly Team Focus  Medical   pt with right sided ataxia,   home with family assist  Iitiate therapy   Bowel/Bladder   Pt requiring I&O caths intermittently. Pt voiding occasionally with therapy. Pt continent of bowel. LBM 04/11/15  Pt will manage bowel and bladder with min assist.  Attempt timed toileting. Offer bathroom frequently.    Swallow/Nutrition/ Hydration     na        ADL's   currently supervision for UB selfcare and min assist for LB selfcare sit to stand.  Mod assist for transfers  without assistive device.  slight weakness and ataxia in the RUE, more weakness proximal than distal  supervision overall  selfcare re-training, transfer training, neuromuscular re-education, therapeutic activities, therapeutic exercise, pt/family education   Mobility     eval pending        Communication     na        Safety/Cognition/ Behavioral Observations  Pt following current safety plan and calling approiately.  Pt will remain free of falls and injury with min assist   Encourage use of safety precauitions   Pain   No complaints of pain  Manage pain at 3 or less on a scale of 0-10  Assess Pain Q4 hours and prn and treat accordingly.    Skin   Bilateral edema to lower extremities. CDI.   Prevent skin breakdown and infection  Assess skin Q shift and prn    Rehab Goals Patient on target to meet rehab goals: Yes *See Care Plan and progress notes for long and short-term goals.  Barriers to Discharge: fear of falling    Possible Resolutions to Barriers:  cont PT/OT    Discharge Planning/Teaching Needs:    Home with husband and son checking daily on. Son is here today to observe in therapies. Pt was very active prior to admission     Team Discussion:  New eval-goals of supervision / mod/i level.  Ataxia to the right-fear of falling. Visual issues-right eye. I & O caths-will work on bladder  issues. Check UA. Balance issues. Complained of pain in l-knee on acute, not now.  Revisions to Treatment Plan:  New eval   Continued Need for Acute Rehabilitation Level of Care: The patient requires daily medical management by a physician with specialized training in physical medicine and rehabilitation for the following conditions: Daily direction of a multidisciplinary physical rehabilitation program to ensure safe treatment while eliciting the highest outcome that is of practical value to the patient.: Yes Daily medical management of patient stability for increased activity during  participation in an intensive rehabilitation regime.: Yes Daily analysis of laboratory values and/or radiology reports with any subsequent need for medication adjustment of medical intervention for : Neurological problems;Other  Kasheem Toner, [name] 04/12/2015, 1:35 PM

## 2015-04-12 ENCOUNTER — Inpatient Hospital Stay (HOSPITAL_COMMUNITY): Payer: Medicare Other | Admitting: Physical Therapy

## 2015-04-12 ENCOUNTER — Inpatient Hospital Stay (HOSPITAL_COMMUNITY): Payer: Medicare Other | Admitting: Occupational Therapy

## 2015-04-12 DIAGNOSIS — R270 Ataxia, unspecified: Secondary | ICD-10-CM

## 2015-04-12 DIAGNOSIS — R339 Retention of urine, unspecified: Secondary | ICD-10-CM

## 2015-04-12 LAB — URINE CULTURE: Culture: >=100000

## 2015-04-12 MED ORDER — AMOXICILLIN 250 MG PO CAPS
250.0000 mg | ORAL_CAPSULE | Freq: Three times a day (TID) | ORAL | Status: DC
  Administered 2015-04-12 – 2015-04-17 (×16): 250 mg via ORAL
  Filled 2015-04-12 (×19): qty 1

## 2015-04-12 NOTE — Plan of Care (Signed)
Problem: RH BLADDER ELIMINATION Goal: RH STG MANAGE BLADDER WITH ASSISTANCE STG Manage Bladder With mod Assistance  Outcome: Not Progressing Requiring I&O cath throughout the night  Goal: RH STG MANAGE BLADDER WITH EQUIPMENT WITH ASSISTANCE STG Manage Bladder With Equipment With mod Assistance  Outcome: Not Progressing I&O cath

## 2015-04-12 NOTE — Progress Notes (Signed)
Physical Therapy Session Note  Patient Details  Name: Darlene Murray MRN: [id number] Date of Birth: 29-Mar-1922  Today's Date: 04/12/2015 PT Individual Time: 0900-1000 PT Individual Time Calculation (min): 60 min   Short Term Goals: Week 1:  PT Short Term Goal 1 (Week 1): pt will transfer consistenlty with min assist to a variety of chairs and simulated car PT Short Term Goal 2 (Week 1): pt will perform gait x 150' level tile in controlled environmnt with min assist and LRAD  including turns R and L PT Short Term Goal 3 (Week 1): pt will ascend and descend 12 steps 2 rails with supervision PT Short Term Goal 4 (Week 1): pt will ascend/descend ramp and curb with min assist with LRAD  PT Short Term Goal 5 (Week 1): pt will perform gait x 50' in home setting over carpet with min assist and LRAD  Skilled Therapeutic Interventions/Progress Updates:   Focus on standing balance, NMR, functional mobility training, and activity tolerance. Patient's husband present and involved during session. Transferred to edge of bed with supervision and donned shoes with assist. Gait using RW 2 x 135 ft with min A overall, verbal cues for upright posture and safe use of AD due to tendency to veer to R due to RUE weakness. Completed outcome measures, see below. Stair training up/down 12 stairs using 2 rails with reciprocal pattern and min guard. Picked up item from floor with min A and gait on compliant uneven surface x 10 ft using RW with min A and max verbal cues for safe use AD. Sit <> stand transfer training without UE support from mat in lowest position with max multimodal cues for eccentric control due to patient tendency to push against surface with back of BLE. Static standing balance without UE support up to 60 sec with S. Patient ambulated back to room and left sitting in recliner with all needs within reach and husband present.   5TSS with UE support and min A = 39 sec  TUG without AD and mod A overall x 3 trials  = 53 sec, 45, sec, and 35 sec (avg of 44.3 sec)  Therapy Documentation Precautions:  Precautions Precautions: Fall Restrictions Weight Bearing Restrictions: No Pain: Pain Assessment Pain Assessment: No/denies pain Pain Score: 0-No pain   See Function Navigator for Current Functional Status.   Therapy/Group: Individual Therapy  Laretta Alstrom 04/12/2015, 9:44 AM

## 2015-04-12 NOTE — Progress Notes (Signed)
Occupational Therapy Session Note  Patient Details  Name: Darlene Murray MRN: [id number] Date of Birth: August 07, 1921  Today's Date: 04/12/2015 OT Individual Time: 8325-4982 OT Individual Time Calculation (min): 29 min    Skilled Therapeutic Interventions/Progress Updates:    Pt worked on Clinical research associate during session.  Began with dowel rod exercises in sitting with focus on shoulder flexion.  Pt unable to achieve and maintain right elbow extension during movement.  Min facilitation needed from therapist to avoid leaning back as well as complete the UE movement.  Performed 3 sets of 5,5, and 10 repetitions with 1lb dowel.   Next had pt work on reaching up to place cups on the table with the RUE.  She had to pick up a cup from the therapist and then manipulate in her hand and place on the table, stacking them again.  Min facilitation needed to avoid compensatory strategies of leaning to the left side.  Finished session by having pt perform nine hole peg test.  Mrs. Fotopoulos was able to complete with the left hand in 56 seconds and the right in 1 min 16 seconds.  Returned to room via wheelchair at conclusion of session.  Educated pt and spouse on RUE FM exercises using cards, coins, magazines.  Also encouraged her to work on shoulder flexion with the RUE.    Therapy Documentation Precautions:  Precautions Precautions: Fall Restrictions Weight Bearing Restrictions: No  Pain: Pain Assessment Pain Assessment: No/denies pain ADL: See Function Navigator for Current Functional Status.   Therapy/Group: Individual Therapy  Torri Michalski OTR/L 04/12/2015, 3:44 PM

## 2015-04-12 NOTE — Progress Notes (Signed)
Occupational Therapy Session Note  Patient Details  Name: Darlene Murray MRN: [id number] Date of Birth: [birth year]  Today's Date: 04/12/2015 OT Individual Time: 1300-1400 OT Individual Time Calculation (min): 60 min    Short Term Goals: Week 1:  OT Short Term Goal 1 (Week 1): Pt will perform toilet transfers with RW and 3:1 with supervision. OT Short Term Goal 2 (Week 1): Pt will perform shower transfers to walk-in shower with supervision. OT Short Term Goal 3 (Week 1): Pt will perform LB dressing and bathing with supervision sit to stand. OT Short Term Goal 4 (Week 1): Pt will perform RUE shoulder exercises with supervision following handout.    Skilled Therapeutic Interventions/Progress Updates:    1:1 Focus on transfer training with and without RW with focus on standing balance and balance with advancement of right LE. Functional ambulation with RW from recliner to toilet with mod tactile cues for controlling RW. Transitioned down to the gym. Practiced Stand pivot transfers without RW with mod A with more than reasonable time for balance and motor planning. Continued practice with sit to stands and controlled descent with stand to sit.  In standing a yoga block placed in between her feet to cue her to maintain a wider base of support. (tendency to stand with feet together with right LE adduction). Transitioned to stepping onto a 1 inch step first with her right LE (10 reps) then with left. Pt required more than reasonable amt of time and mod A to maintain balance and to assist with weight shifting. Transitioned to work on right UE strengthening and ROM in end ranges in sitting and supine with focus on performing normal patterns of movement with tactile cues (performed PNF patterns).  Therapy Documentation Precautions:  Precautions Precautions: Fall Restrictions Weight Bearing Restrictions: No Pain: Pain Assessment Pain Assessment: No/denies pain  See Function Navigator for Current  Functional Status.   Therapy/Group: Individual Therapy  Darlene, Murray [facility] 04/12/2015, 3:23 PM

## 2015-04-12 NOTE — Progress Notes (Signed)
81 y.o. Darlene Murray is a 81 y.o. right handed female with history of steroid-dependent for asthma and hyperlipidemia. Patient lives with spouse and was independent prior to admission. Spouse is retired and able to care for pt on discharge. Presented 04/06/2015 with nausea and vomiting, dizziness, decrease in balance and generalized weakness as well as some mild slurred speech. MRI imaging revealed acute moderate sized nonhemorrhagic infarct posterior right upper and mid pons and adjacent right cerebellum. Acute tiny nonhemorrhagic infarct left thalamic infarct. Acute nonhemorrhagic left occipital lobe infarct. CTA of head and neck with no significant stenosis or occlusion. Echocardiogram results are pending. Patient did not receive TPA. Neurology consulted maintain on aspirin 325 mg daily for CVA prophylaxis  Subjective/Complaints: Urinary retention Reviewed meds , only trazodone may cause retention on her list Pt denies pains,   ROS-  + constip, neg for bladder issues but then pt states "they take it out", no SOB   Objective: Vital Signs: Blood pressure 138/61, pulse 72, temperature 98.2 F (36.8 C), temperature source Oral, resp. rate 18, weight 75.388 kg (166 lb 3.2 oz), SpO2 97 %. No results found. Results for orders placed or performed during the hospital encounter of 04/10/15 (from the past 72 hour(s))  CBC WITH DIFFERENTIAL     Status: None   Collection Time: 04/10/15  3:51 PM  Result Value Ref Range   WBC 7.6 4.0 - 10.5 K/uL   RBC 4.77 3.87 - 5.11 MIL/uL   Hemoglobin 13.9 12.0 - 15.0 g/dL   HCT 42.2 36.0 - 46.0 %   MCV 88.5 78.0 - 100.0 fL   MCH 29.1 26.0 - 34.0 pg   MCHC 32.9 30.0 - 36.0 g/dL   RDW 15.4 11.5 - 15.5 %   Platelets 250 150 - 400 K/uL   Neutrophils Relative % 71 %   Neutro Abs 5.4 1.7 - 7.7 K/uL   Lymphocytes Relative 14 %   Lymphs Abs 1.1 0.7 - 4.0 K/uL   Monocytes Relative 10 %   Monocytes Absolute 0.8 0.1 - 1.0 K/uL   Eosinophils Relative 5 %    Eosinophils Absolute 0.4 0.0 - 0.7 K/uL   Basophils Relative 0 %   Basophils Absolute 0.0 0.0 - 0.1 K/uL  Comprehensive metabolic panel     Status: Abnormal   Collection Time: 04/10/15  3:51 PM  Result Value Ref Range   Sodium 139 135 - 145 mmol/L   Potassium 4.4 3.5 - 5.1 mmol/L   Chloride 104 101 - 111 mmol/L   CO2 27 22 - 32 mmol/L   Glucose, Bld 127 (H) 65 - 99 mg/dL   BUN 15 6 - 20 mg/dL   Creatinine, Ser 1.06 (H) 0.44 - 1.00 mg/dL   Calcium 9.3 8.9 - 10.3 mg/dL   Total Protein 6.5 6.5 - 8.1 g/dL   Albumin 3.0 (L) 3.5 - 5.0 g/dL   AST 26 15 - 41 U/L   ALT 15 14 - 54 U/L   Alkaline Phosphatase 52 38 - 126 U/L   Total Bilirubin 1.2 0.3 - 1.2 mg/dL   GFR calc non Af Amer 44 (L) >60 mL/min   GFR calc Af Amer 52 (L) >60 mL/min    Comment: (NOTE) The eGFR has been calculated using the CKD EPI equation. This calculation has not been validated in all clinical situations. eGFR's persistently <60 mL/min signify possible Chronic Kidney Disease.    Anion gap 8 5 - 15  Glucose, capillary     Status: Abnormal  Collection Time: 04/10/15  5:00 PM  Result Value Ref Range   Glucose-Capillary 112 (H) 65 - 99 mg/dL  Urine culture     Status: None (Preliminary result)   Collection Time: 04/10/15  5:28 PM  Result Value Ref Range   Specimen Description URINE, CATHETERIZED    Special Requests NONE    Culture >=100,000 COLONIES/mL ENTEROCOCCUS SPECIES    Report Status PENDING   Urinalysis, Routine w reflex microscopic (not at [facility])     Status: Abnormal   Collection Time: 04/11/15 10:16 PM  Result Value Ref Range   Color, Urine YELLOW YELLOW   APPearance CLOUDY (A) CLEAR   Specific Gravity, Urine 1.015 1.005 - 1.030   pH 7.0 5.0 - 8.0   Glucose, UA NEGATIVE NEGATIVE mg/dL   Hgb urine dipstick NEGATIVE NEGATIVE   Bilirubin Urine NEGATIVE NEGATIVE   Ketones, ur NEGATIVE NEGATIVE mg/dL   Protein, ur NEGATIVE NEGATIVE mg/dL   Urobilinogen, UA 0.2 0.0 - 1.0 mg/dL   Nitrite NEGATIVE  NEGATIVE   Leukocytes, UA NEGATIVE NEGATIVE    Comment: MICROSCOPIC NOT DONE ON URINES WITH NEGATIVE PROTEIN, BLOOD, LEUKOCYTES, NITRITE, OR GLUCOSE <1000 mg/dL.      General: No acute distress Mood and affect are appropriate Heart: Regular rate and rhythm no rubs murmurs or extra sounds Lungs: Clear to auscultation, breathing unlabored, no rales or wheezes Abdomen: Positive bowel sounds, soft nontender to palpation, nondistended Extremities: No clubbing, cyanosis, or edema Skin: No evidence of breakdown, no evidence of rash Neurologic: Cranial nerves II through XII intact, motor strength is 5/5 in bilateral deltoid, bicep, tricep, grip, hip flexor, knee extensors, ankle dorsiflexor and plantar flexor Sensory exam normal sensation to light touch and proprioception in bilateral upper and lower extremities Cerebellar exam normal finger to nose to finger on left , mild dysmetria on right, some left visual neglect on confrontation but not cosistent, no evidence of nystamus Musculoskeletal: Full range of motion in all 4 extremities. No joint swelling   Assessment/Plan: 1. 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 Physiatrist is providing close team supervision and 24 hour management of active medical problems listed below. Physiatrist and rehab team continue to assess barriers to discharge/monitor patient progress toward functional and medical goals. FIM: Function - Bathing Position: Wheelchair/chair at sink Body parts bathed by patient: Right arm, Left arm, Chest, Abdomen, Front perineal area, Buttocks, Right upper leg, Left upper leg, Back Body parts bathed by helper: Left lower leg, Right lower leg  Function- Upper Body Dressing/Undressing What is the patient wearing?: Pull over shirt/dress, Bra, Button  up shirt Bra - Perfomed by patient: Thread/unthread right bra strap, Thread/unthread left bra strap, Hook/unhook bra (pull down sports bra) Pull over shirt/dress - Perfomed by patient: Thread/unthread right sleeve, Thread/unthread left sleeve, Put head through opening, Pull shirt over trunk Button up shirt - Perfomed by patient: Thread/unthread right sleeve, Thread/unthread left sleeve, Pull shirt around back, Button/unbutton shirt Function - Lower Body Dressing/Undressing What is the patient wearing?: Pants, Socks, Non-skid slipper socks Pants- Performed by patient: Thread/unthread right pants leg, Thread/unthread left pants leg, Pull pants up/down Pants- Performed by helper: Fasten/unfasten pants Non-skid slipper socks- Performed by patient: Don/doff right sock, Don/doff left sock Assist for lower body dressing: Touching or steadying assistance (Pt > 75%)  Function - Toileting Toileting activity did not occur: No continent bowel/bladder event  Toileting steps completed by patient: Adjust clothing prior to toileting, Performs perineal hygiene, Adjust clothing after toileting Toileting steps completed by helper: Performs perineal hygiene, Adjust clothing after toileting Toileting Assistive Devices: Grab bar or rail Assist level: Touching or steadying assistance (Pt.75%)  Function - Toilet Transfers Toilet transfer assistive device: Bedside commode, Walker Assist level to toilet: Touching or steadying assistance (Pt > 75%) Assist level from toilet: Touching or steadying assistance (Pt > 75%)  Function - Chair/bed transfer Chair/bed transfer method: Stand pivot Chair/bed transfer assist level: Moderate assist (Pt 50 - 74%/lift or lower) Chair/bed transfer assistive device: Armrests Chair/bed transfer details: Manual facilitation for weight shifting, Verbal cues for technique  Function - Locomotion: Wheelchair Will patient use wheelchair at discharge?: No Max wheelchair distance:  30 Assist Level: Touching or steadying assistance (Pt > 75%) Wheel 50 feet with 2 turns activity did not occur:  (will be an ambulator at d/c) Function - Locomotion: Ambulation Assistive device: No device, Hand held assist Max distance: 15 Assist level: Moderate assist (Pt 50 - 74%) Assist level: Moderate assist (Pt 50 - 74%) Walk 50 feet with 2 turns activity did not occur: Safety/medical concerns (weakness and fear of falling) Walk 10 feet on uneven surfaces activity did not occur: Safety/medical concerns (weakness and fear of falling)  Function - Comprehension Comprehension: Auditory Comprehension assist level: Understands complex 90% of the time/cues 10% of the time  Function - Expression Expression: Verbal Expression assist level: Expresses complex 90% of the time/cues < 10% of the time  Function - Social Interaction Social Interaction assist level: Interacts appropriately 90% of the time - Needs monitoring or encouragement for participation or interaction.  Function - Problem Solving Problem solving assist level: Solves basic 90% of the time/requires cueing < 10% of the time  Function - Memory Memory assist level: Recognizes or recalls 90% of the time/requires cueing < 10% of the time Patient normally able to recall (first 3 days only): Current season, That he or she is in a hospital, Staff names and faces Medical Problem List and Plan: 1. Functional deficits secondary to bilateral hemorrhagic  infarct suspect embolic right ponto-cerebellar and left occipital 2.  DVT Prophylaxis/Anticoagulation: SCDs. Monitor for any signs of DVT 3. Pain Management: Tylenol as needed 4. Steroid-dependent asthma. Continue prednisone as well as Dulera 5. Neuropsych: This patient is capable of making decisions on her own behalf. 6. Skin/Wound Care: Routine skin checks 7. Fluids/Electrolytes/Nutrition: Routine I&O with follow-up bmet normal 8. Hyperlipidemia. Pravachol 9.  Hypoalbuminemia will  add benprotein 10.  Constipation adjust laxatives 11.  Urinary retention 2 sm voids, d/c trazodone, poss UTI UA neg and cx + for enterococcus, start abx LOS (Days) 2 A FACE TO FACE EVALUATION WAS PERFORMED  Daud Cayer E 04/12/2015, 7:17 AM

## 2015-04-12 NOTE — Progress Notes (Signed)
Social Work [name], LCSW Social Worker Signed  Patient Care Conference 04/11/2015  2:01 PM    Expand All Collapse All   Inpatient RehabilitationTeam Conference and Plan of Care Update Date: 04/11/2015   Time: 110;55 AM     Patient Name: Darlene Murray       Medical Record Number: [id number]  Date of Birth: [birth year] Sex: Female         Room/Bed: 4M03C/4M03C-01 Payor Info: Payor: MEDICARE / Plan: MEDICARE PART A AND B / Product Type: *No Product type* /    Admitting Diagnosis: b Cvas    Admit Date/Time:  04/10/2015  2:13 PM Admission Comments: No comment available   Primary Diagnosis:  Ataxia Principal Problem: Ataxia    Patient Active Problem List     Diagnosis  Date Noted   .  Acute hemorrhagic infarction of brain ([place])  04/10/2015   .  Ataxia     .  Cerebrovascular accident (CVA) due to embolism ([place])  04/07/2015   .  Weakness  04/06/2015   .  Generalized weakness  04/06/2015   .  Nausea with vomiting  04/06/2015   .  Diastolic dysfunction  12/28/7626   .  Severe persistent asthma in adult steroid dependent     .  Allergic rhinitis     .  Hyperlipidemia     .  Venous insufficiency     .  HNP (herniated nucleus pulposus), lumbar     .  Left ventricular hypertrophy       Expected Discharge Date: Expected Discharge Date: 04/20/15  Team Members Present: Physician leading conference: Dr. Alysia Penna Social Worker Present: Ovidio Kin, LCSW Nurse Present: Heather Roberts, RN PT Present: Georjean Mode, PT OT Present: Clyda Greener, OT SLP Present: Gunnar Fusi, SLP PPS Coordinator present : Daiva Nakayama, RN, CRRN        Current Status/Progress  Goal  Weekly Team Focus   Medical     pt with right sided ataxia,   home with family assist  Iitiate therapy   Bowel/Bladder     Pt requiring I&O caths intermittently. Pt voiding occasionally with therapy. Pt continent of bowel. LBM 04/11/15   Pt will manage bowel and bladder with min assist.   Attempt timed toileting.  Offer bathroom frequently.    Swallow/Nutrition/ Hydration       na         ADL's     currently supervision for UB selfcare and min assist for LB selfcare sit to stand.  Mod assist for transfers without assistive device.  slight weakness and ataxia in the RUE, more weakness proximal than distal   supervision overall  selfcare re-training, transfer training, neuromuscular re-education, therapeutic activities, therapeutic exercise, pt/family education    Mobility       eval pending         Communication       na         Safety/Cognition/ Behavioral Observations    Pt following current safety plan and calling approiately.  Pt will remain free of falls and injury with min assist   Encourage use of safety precauitions    Pain     No complaints of pain  Manage pain at 3 or less on a scale of 0-10  Assess Pain Q4 hours and prn and treat accordingly.    Skin     Bilateral edema to lower extremities. CDI.   Prevent skin breakdown and infection   Assess skin Q  shift and prn    Rehab Goals Patient on target to meet rehab goals: Yes *See Care Plan and progress notes for long and short-term goals.    Barriers to Discharge:  fear of falling     Possible Resolutions to Barriers:   cont PT/OT     Discharge Planning/Teaching Needs:     Home with husband and son checking daily on. Son is here today to observe in therapies. Pt was very active prior to admission      Team Discussion:    New eval-goals of supervision / mod/i level.  Ataxia to the right-fear of falling. Visual issues-right eye. I & O caths-will work on bladder issues. Check UA. Balance issues. Complained of pain in l-knee on acute, not now.   Revisions to Treatment Plan:    New eval    Continued Need for Acute Rehabilitation Level of Care: The patient requires daily medical management by a physician with specialized training in physical medicine and rehabilitation for the following conditions: Daily direction of a  multidisciplinary physical rehabilitation program to ensure safe treatment while eliciting the highest outcome that is of practical value to the patient.: Yes Daily medical management of patient stability for increased activity during participation in an intensive rehabilitation regime.: Yes Daily analysis of laboratory values and/or radiology reports with any subsequent need for medication adjustment of medical intervention for : Neurological problems;Other  [name] 04/12/2015, 1:35 PM                  Patient ID: Darlene Murray, female   DOB: 10/03/1921, 81 y.o.   MRN: [id number]

## 2015-04-12 NOTE — Progress Notes (Signed)
Occupational Therapy Session Note  Patient Details  Name: Darlene Murray MRN: [id number] Date of Birth: Dec 16, 1921  Today's Date: 04/12/2015 OT Individual Time: 1100-1200 OT Individual Time Calculation (min): 60 min    Short Term Goals: Week 1:  OT Short Term Goal 1 (Week 1): Pt will perform toilet transfers with RW and 3:1 with supervision. OT Short Term Goal 2 (Week 1): Pt will perform shower transfers to walk-in shower with supervision. OT Short Term Goal 3 (Week 1): Pt will perform LB dressing and bathing with supervision sit to stand. OT Short Term Goal 4 (Week 1): Pt will perform RUE shoulder exercises with supervision following handout.    Skilled Therapeutic Interventions/Progress Updates:    Pt rolled herself in her wheelchair for 1/3 of the way to the [employer] with supervision using just her UEs.  Therapist assisted with the rest of the rolling.  Worked on visual scanning and reaction time using the [employer].  Had pt perform first sitting EOC with overall reaction time in all quadrants 2 seconds or less, except for the right superior which as over 3 seconds.  Progressed to standing with and without walker to perform as well with min assist for balance.  Reaction times in standing using entire board less than 2 seconds in all quadrants.  Worked on just using the RUE only for some 60 second intervals with pt demonstrating reaction times of less than 3 seconds for most however for superior quadrants or when having to reach to the far left across her body she needed min facilitation.  No clear visual field deficit noted with all intervals.  Pt also did well with line bisection test while resting.  Returned to room via wheelchair while having pt identify items that were "green" in the hallway.  Pt identifying most on the right but needed cueing to also scan to the left.  Will continue to assess vision with functional tasks throughout stay.  Pt transferred to recliner at end of session.  Pt's  spouse preset for session as well.   Therapy Documentation Precautions:  Precautions Precautions: Fall Restrictions Weight Bearing Restrictions: No  Pain: Pain Assessment Pain Assessment: No/denies pain Pain Score: 0-No pain ADL: See Function Navigator for Current Functional Status.   Therapy/Group: Individual Therapy  Quinlan Vollmer OTR/L 04/12/2015, 12:33 PM

## 2015-04-13 ENCOUNTER — Inpatient Hospital Stay (HOSPITAL_COMMUNITY): Payer: Medicare Other | Admitting: Occupational Therapy

## 2015-04-13 ENCOUNTER — Inpatient Hospital Stay (HOSPITAL_COMMUNITY): Payer: Medicare Other | Admitting: Physical Therapy

## 2015-04-13 MED ORDER — SENNOSIDES-DOCUSATE SODIUM 8.6-50 MG PO TABS
2.0000 | ORAL_TABLET | Freq: Two times a day (BID) | ORAL | Status: DC
  Administered 2015-04-13 – 2015-04-18 (×8): 2 via ORAL
  Filled 2015-04-13 (×10): qty 2

## 2015-04-13 MED ORDER — BISACODYL 10 MG RE SUPP: 10.0000 mg | Freq: Every day | RECTAL | Status: DC | PRN

## 2015-04-13 NOTE — Progress Notes (Signed)
Occupational Therapy Session Note  Patient Details  Name: Darlene Murray MRN: [id number] Date of Birth: [birth year]  Today's Date: 04/13/2015 OT Individual Time: 1100-1159 OT Individual Time Calculation (min): 59 min    Short Term Goals: Week 1:  OT Short Term Goal 1 (Week 1): Pt will perform toilet transfers with RW and 3:1 with supervision. OT Short Term Goal 2 (Week 1): Pt will perform shower transfers to walk-in shower with supervision. OT Short Term Goal 3 (Week 1): Pt will perform LB dressing and bathing with supervision sit to stand. OT Short Term Goal 4 (Week 1): Pt will perform RUE shoulder exercises with supervision following handout.    Skilled Therapeutic Interventions/Progress Updates:    Pt ambulated down to the therapy gym with use of the RW.  Min instructional cueing to keep her head up and not look at the floor.  She demonstrates increased shift over to the left of the walker with mobility.  She was able to transfer to the therapy mat in the gym to work on RUE functional movement and strength.  Had pt pick up clothespins and manipulate in the RUE and place on vertical grid.  She needed min assist to complete as she had to reach higher to place the pins.  Decreased full elbow extension noted with reaching and pt attempting to compensate with her trunk.  Transitioned to standing to work on removing clothespins as well with pt having to place them in a container down low to further work on dynamic standing balance during task.  Min assist needed for reaching down and returning to midline.  Pt requiring multiple rest breaks after 7-10 attempts.  Finished UE work using 1" pegs and having pt pick them up one at a time and transition to the palm of her hand, until she had 3, then transitioned them back to her fingertips to place in a grid.  No drops noted during this task as well.  Returned to room via walker at end of session.  Pt's husband present throughout as well.    Therapy  Documentation Precautions:  Precautions Precautions: Fall Restrictions Weight Bearing Restrictions: No  Pain: Pain Assessment Pain Assessment: No/denies pain Pain Score: 0-No pain ADL: See Function Navigator for Current Functional Status.   Therapy/Group: Individual Therapy  Abrar Bilton OTR/L 04/13/2015, 12:27 PM

## 2015-04-13 NOTE — IPOC Note (Signed)
Overall Plan of Care [facility]) Patient Details Name: Darlene Murray MRN: [id number] DOB: 1921/12/28  Admitting Diagnosis: b Cvas   Hospital Problems: Principal Problem:   Ataxia Active Problems:   Acute hemorrhagic infarction of brain [facility])     Functional Problem List: Nursing Bladder, Bowel, Endurance, Nutrition, Safety  PT Balance, Endurance, Motor  OT Balance, Motor, Pain  SLP    TR         Basic ADL's: OT Grooming, Bathing, Dressing, Toileting     Advanced  ADL's: OT Simple Meal Preparation, Laundry     Transfers: PT Bed Mobility, Bed to Chair, Car, Manufacturing systems engineer, Metallurgist: PT Ambulation, Emergency planning/management officer, Stairs     Additional Impairments: OT Fuctional Use of Upper Extremity  SLP        TR      Anticipated Outcomes Item Anticipated Outcome  Self Feeding independent  Swallowing      Basic self-care  supervision  Toileting  supervision   Bathroom Transfers supervision  Bowel/Bladder  Continent to bowel and bladder with min. assisst and timming toiletting.  Transfers  supervision overall  Locomotion  supervision overall with gait x 150', up/down 12 steps with 2 rails   Communication     Cognition     Pain  Less than 3,on scale 1 to 10.  Safety/Judgment  Free from falls during her stay in rehab.   Therapy Plan: PT Intensity: Minimum of 1-2 x/day ,45 to 90 minutes PT Frequency: 5 out of 7 days PT Duration Estimated Length of Stay: 10-12 OT Intensity: Minimum of 1-2 x/day, 45 to 90 minutes OT Frequency: 5 out of 7 days OT Duration/Estimated Length of Stay: 10-12 days         Team Interventions: Nursing Interventions Patient/Family Education, Bladder Management, Disease Management/Prevention  PT interventions Ambulation/gait training, Training and development officer, Discharge planning, Community reintegration, DME/adaptive equipment instruction, Functional mobility training, Patient/family education, Neuromuscular  re-education, Psychosocial support, Splinting/orthotics, Therapeutic Exercise, Therapeutic Activities, Stair training, UE/LE Strength taining/ROM, UE/LE Coordination activities, Visual/perceptual remediation/compensation, Wheelchair propulsion/positioning  OT Interventions Training and development officer, Engineer, drilling, Patient/family education, Discharge planning, Therapeutic Activities, UE/LE Coordination activities, UE/LE Strength taining/ROM, Self Care/advanced ADL retraining, Neuromuscular re-education, Therapeutic Exercise, Community reintegration, Functional mobility training  SLP Interventions    TR Interventions    SW/CM Interventions Discharge Planning, Psychosocial Support, Patient/Family Education    Team Discharge Planning: Destination: PT-Home ,OT- Home , SLP-  Projected Follow-up: PT-Home health PT, OT-  Outpatient OT, SLP-  Projected Equipment Needs: PT-Rolling walker with 5" wheels, OT- To be determined, SLP-  Equipment Details: PT- , OT-  Patient/family involved in discharge planning: PT- Patient,  OT-Patient, Family member/caregiver, SLP-   MD ELOS: 13-17d Medical Rehab Prognosis:  Good Assessment: 81 y.o. right handed female with history of steroid-dependent for asthma and hyperlipidemia. Patient lives with spouse and was independent prior to admission. Spouse is retired and able to care for pt on discharge. Presented 04/06/2015 with nausea and vomiting, dizziness, decrease in balance and generalized weakness as well as some mild slurred speech. MRI imaging revealed acute moderate sized nonhemorrhagic infarct posterior right upper and mid pons and adjacent right cerebellum. Acute tiny nonhemorrhagic infarct left thalamic infarct. Acute nonhemorrhagic left occipital lobe infarct   Now requiring 24/7 Rehab RN,MD, as well as CIR level PT, OT and SLP.  Treatment team will focus on ADLs and mobility with goals set at Sup  See Team Conference Notes for weekly  updates to the plan  of care

## 2015-04-13 NOTE — Progress Notes (Signed)
81 y.o. Darlene Murray is a 81 y.o. right handed female with history of steroid-dependent for asthma and hyperlipidemia. Patient lives with spouse and was independent prior to admission. Spouse is retired and able to care for pt on discharge. Presented 04/06/2015 with nausea and vomiting, dizziness, decrease in balance and generalized weakness as well as some mild slurred speech. MRI imaging revealed acute moderate sized nonhemorrhagic infarct posterior right upper and mid pons and adjacent right cerebellum. Acute tiny nonhemorrhagic infarct left thalamic infarct. Acute nonhemorrhagic left occipital lobe infarct. CTA of head and neck with no significant stenosis or occlusion. Echocardiogram results are pending. Patient did not receive TPA. Neurology consulted maintain on aspirin 325 mg daily for CVA prophylaxis  Subjective/Complaints: Urinary retention with occ voids Constipated no abd pain Good appetite and fluid intake   ROS-  + constip, neg for bladder issues but then pt states "they take it out", no SOB   Objective: Vital Signs: Blood pressure 133/58, pulse 63, temperature 98.3 F (36.8 C), temperature source Oral, resp. rate 17, weight 75.388 kg (166 lb 3.2 oz), SpO2 100 %. No results found. Results for orders placed or performed during the hospital encounter of 04/10/15 (from the past 72 hour(s))  CBC WITH DIFFERENTIAL     Status: None   Collection Time: 04/10/15  3:51 PM  Result Value Ref Range   WBC 7.6 4.0 - 10.5 K/uL   RBC 4.77 3.87 - 5.11 MIL/uL   Hemoglobin 13.9 12.0 - 15.0 g/dL   HCT 42.2 36.0 - 46.0 %   MCV 88.5 78.0 - 100.0 fL   MCH 29.1 26.0 - 34.0 pg   MCHC 32.9 30.0 - 36.0 g/dL   RDW 15.4 11.5 - 15.5 %   Platelets 250 150 - 400 K/uL   Neutrophils Relative % 71 %   Neutro Abs 5.4 1.7 - 7.7 K/uL   Lymphocytes Relative 14 %   Lymphs Abs 1.1 0.7 - 4.0 K/uL   Monocytes Relative 10 %   Monocytes Absolute 0.8 0.1 - 1.0 K/uL   Eosinophils Relative 5 %   Eosinophils Absolute 0.4 0.0 - 0.7 K/uL   Basophils Relative 0 %   Basophils Absolute 0.0 0.0 - 0.1 K/uL  Comprehensive metabolic panel     Status: Abnormal   Collection Time: 04/10/15  3:51 PM  Result Value Ref Range   Sodium 139 135 - 145 mmol/L   Potassium 4.4 3.5 - 5.1 mmol/L   Chloride 104 101 - 111 mmol/L   CO2 27 22 - 32 mmol/L   Glucose, Bld 127 (H) 65 - 99 mg/dL   BUN 15 6 - 20 mg/dL   Creatinine, Ser 1.06 (H) 0.44 - 1.00 mg/dL   Calcium 9.3 8.9 - 10.3 mg/dL   Total Protein 6.5 6.5 - 8.1 g/dL   Albumin 3.0 (L) 3.5 - 5.0 g/dL   AST 26 15 - 41 U/L   ALT 15 14 - 54 U/L   Alkaline Phosphatase 52 38 - 126 U/L   Total Bilirubin 1.2 0.3 - 1.2 mg/dL   GFR calc non Af Amer 44 (L) >60 mL/min   GFR calc Af Amer 52 (L) >60 mL/min    Comment: (NOTE) The eGFR has been calculated using the CKD EPI equation. This calculation has not been validated in all clinical situations. eGFR's persistently <60 mL/min signify possible Chronic Kidney Disease.    Anion gap 8 5 - 15  Glucose, capillary     Status: Abnormal   Collection  Time: 04/10/15  5:00 PM  Result Value Ref Range   Glucose-Capillary 112 (H) 65 - 99 mg/dL  Urine culture     Status: None   Collection Time: 04/10/15  5:28 PM  Result Value Ref Range   Specimen Description URINE, CATHETERIZED    Special Requests NONE    Culture >=100,000 COLONIES/mL ENTEROCOCCUS SPECIES    Report Status 04/12/2015 FINAL    Organism ID, Bacteria ENTEROCOCCUS SPECIES       Susceptibility   Enterococcus species - MIC*    AMPICILLIN <=2 SENSITIVE Sensitive     LEVOFLOXACIN >=8 RESISTANT Resistant     NITROFURANTOIN <=16 SENSITIVE Sensitive     VANCOMYCIN 2 SENSITIVE Sensitive     * >=100,000 COLONIES/mL ENTEROCOCCUS SPECIES  Urinalysis, Routine w reflex microscopic (not at [facility])     Status: Abnormal   Collection Time: 04/11/15 10:16 PM  Result Value Ref Range   Color, Urine YELLOW YELLOW   APPearance CLOUDY (A) CLEAR   Specific Gravity, Urine  1.015 1.005 - 1.030   pH 7.0 5.0 - 8.0   Glucose, UA NEGATIVE NEGATIVE mg/dL   Hgb urine dipstick NEGATIVE NEGATIVE   Bilirubin Urine NEGATIVE NEGATIVE   Ketones, ur NEGATIVE NEGATIVE mg/dL   Protein, ur NEGATIVE NEGATIVE mg/dL   Urobilinogen, UA 0.2 0.0 - 1.0 mg/dL   Nitrite NEGATIVE NEGATIVE   Leukocytes, UA NEGATIVE NEGATIVE    Comment: MICROSCOPIC NOT DONE ON URINES WITH NEGATIVE PROTEIN, BLOOD, LEUKOCYTES, NITRITE, OR GLUCOSE <1000 mg/dL.      General: No acute distress Mood and affect are appropriate Heart: Regular rate and rhythm no rubs murmurs or extra sounds Lungs: Clear to auscultation, breathing unlabored, no rales or wheezes Abdomen: Positive bowel sounds, soft nontender to palpation, nondistended Extremities: No clubbing, cyanosis, or edema Skin: No evidence of breakdown, no evidence of rash Neurologic: Cranial nerves II through XII intact, motor strength is 5/5 in bilateral deltoid, bicep, tricep, grip, hip flexor, knee extensors, ankle dorsiflexor and plantar flexor Sensory exam normal sensation to light touch and proprioception in bilateral upper and lower extremities Cerebellar exam normal finger to nose to finger on left , mild dysmetria on right, some left visual neglect on confrontation but not cosistent, no evidence of nystamus Musculoskeletal: Full range of motion in all 4 extremities. No joint swelling   Assessment/Plan: 1. 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 Physiatrist is providing close team supervision and 24 hour management of active medical problems listed below. Physiatrist and rehab team continue to assess barriers to discharge/monitor patient progress toward functional and medical goals. FIM: Function - Bathing Position: Wheelchair/chair at sink Body parts  bathed by patient: Right arm, Left arm, Chest, Abdomen, Front perineal area, Buttocks, Right upper leg, Left upper leg, Back Body parts bathed by helper: Left lower leg, Right lower leg  Function- Upper Body Dressing/Undressing What is the patient wearing?: Pull over shirt/dress, Bra, Button up shirt Bra - Perfomed by patient: Thread/unthread right bra strap, Thread/unthread left bra strap, Hook/unhook bra (pull down sports bra) Pull over shirt/dress - Perfomed by patient: Thread/unthread right sleeve, Thread/unthread left sleeve, Put head through opening, Pull shirt over trunk Button up shirt - Perfomed by patient: Thread/unthread right sleeve, Thread/unthread left sleeve, Pull shirt around back, Button/unbutton shirt Function - Lower Body Dressing/Undressing What is the patient wearing?: Pants, Socks, Non-skid slipper socks  Pants- Performed by patient: Thread/unthread right pants leg, Thread/unthread left pants leg, Pull pants up/down Pants- Performed by helper: Fasten/unfasten pants Non-skid slipper socks- Performed by patient: Don/doff right sock, Don/doff left sock Assist for lower body dressing: Touching or steadying assistance (Pt > 75%)  Function - Toileting Toileting activity did not occur: No continent bowel/bladder event Toileting steps completed by patient: Adjust clothing prior to toileting, Performs perineal hygiene, Adjust clothing after toileting Toileting steps completed by helper: Performs perineal hygiene, Adjust clothing after toileting Toileting Assistive Devices: Grab bar or rail Assist level: Touching or steadying assistance (Pt.75%)  Function - Toilet Transfers Toilet transfer assistive device: Elevated toilet seat/BSC over toilet Assist level to toilet: Touching or steadying assistance (Pt > 75%) Assist level from toilet: Touching or steadying assistance (Pt > 75%)  Function - Chair/bed transfer Chair/bed transfer method: Ambulatory Chair/bed transfer assist  level: Touching or steadying assistance (Pt > 75%) Chair/bed transfer assistive device: Armrests Chair/bed transfer details: Verbal cues for precautions/safety, Verbal cues for technique  Function - Locomotion: Wheelchair Will patient use wheelchair at discharge?: No Max wheelchair distance: 30 Assist Level: Touching or steadying assistance (Pt > 75%) Wheel 50 feet with 2 turns activity did not occur:  (will be an ambulator at d/c) Function - Locomotion: Ambulation Assistive device: No device, Walker-rolling Max distance: 135 ft Assist level: Touching or steadying assistance (Pt > 75%) Assist level: Touching or steadying assistance (Pt > 75%) Walk 50 feet with 2 turns activity did not occur: Safety/medical concerns (weakness and fear of falling) Assist level: Touching or steadying assistance (Pt > 75%) Walk 10 feet on uneven surfaces activity did not occur: Safety/medical concerns (weakness and fear of falling) Assist level: Touching or steadying assistance (Pt > 75%)  Function - Comprehension Comprehension: Auditory Comprehension assist level: Understands complex 90% of the time/cues 10% of the time  Function - Expression Expression: Verbal Expression assist level: Expresses complex 90% of the time/cues < 10% of the time  Function - Social Interaction Social Interaction assist level: Interacts appropriately 90% of the time - Needs monitoring or encouragement for participation or interaction.  Function - Problem Solving Problem solving assist level: Solves basic 90% of the time/requires cueing < 10% of the time  Function - Memory Memory assist level: Recognizes or recalls 90% of the time/requires cueing < 10% of the time Patient normally able to recall (first 3 days only): Current season, That he or she is in a hospital, Staff names and faces Medical Problem List and Plan: 1. Functional deficits secondary to bilateral hemorrhagic  infarct suspect embolic right ponto-cerebellar  and left occipital 2.  DVT Prophylaxis/Anticoagulation: SCDs. Monitor for any signs of DVT 3. Pain Management: Tylenol as needed 4. Steroid-dependent asthma. Continue prednisone as well as Dulera 5. Neuropsych: This patient is capable of making decisions on her own behalf. 6. Skin/Wound Care: Routine skin checks, IV site ok but no longer needs will d/c 7. Fluids/Electrolytes/Nutrition: Routine I&O with follow-up bmet normal 8. Hyperlipidemia. Pravachol 9.  Hypoalbuminemia will add beneprotein 10.  Constipation adjust laxatives, add dulc supp prn 11.  Urinary retention 2 sm voids, d/c trazodone, 12. Poss UTI UA neg and cx + for enterococcus, on Amoxil LOS (Days) 3 A FACE TO FACE EVALUATION WAS PERFORMED  KIRSTEINS,ANDREW E 04/13/2015, 6:56 AM

## 2015-04-13 NOTE — Progress Notes (Signed)
Physical Therapy Note  Patient Details  Name: Darlene Murray MRN: [id number] Date of Birth: December 10, 1921 Today's Date: 04/13/2015    Time: 830-928 58 minutes  1:1 No c/o pain.  Gait training with RW with close supervision/steadying assist 130', 100'. Pt with narrow BOS, Rt trunk rotation during gait, difficult to correct with verbal/tactile cues.  Bed mobility to height of pt's bed at home (28''). Pt able to perform with min A on the mat, but pt/PT agree it would be best to lower bed at home if possible. Standing balance training with side stepping, step ups, wt shifts with min A for balance.  Gait on carpet with obstacle negotiation, side and backward stepping with min A for steadying.  Pt requires frequent rest breaks but is willing and motivated to participate.  Husband present throughout session.  Santez Woodcox 04/13/2015, 9:29 AM

## 2015-04-13 NOTE — Progress Notes (Signed)
Occupational Therapy Session Note  Patient Details  Name: Darlene Murray MRN: [id number] Date of Birth: Jun 20, 1922  Today's Date: 04/13/2015 OT Individual Time: 1400-1527 OT Individual Time Calculation (min): 87 min    Short Term Goals: Week 1:  OT Short Term Goal 1 (Week 1): Pt will perform toilet transfers with RW and 3:1 with supervision. OT Short Term Goal 2 (Week 1): Pt will perform shower transfers to walk-in shower with supervision. OT Short Term Goal 3 (Week 1): Pt will perform LB dressing and bathing with supervision sit to stand. OT Short Term Goal 4 (Week 1): Pt will perform RUE shoulder exercises with supervision following handout.    Skilled Therapeutic Interventions/Progress Updates:    Pt began session by walking down to the orthopedic gym and practicing walk in shower transfers using shower seat, RW, and simulated shower edge.  Pt's husband present as well.  Pt able to ambulate with min assist using the RW.  Min instructional cueing to stay closer to the walker.  Pt able to complete transfer with min assist.  Discussed need for shower seat as well as placement in the shower.  Also discussed use of suction cup grab bars to assist with transfer into the shower also.  Pt plans to purchase hand held shower.  Next pt ambulated to the therapy gym and worked on UE and LE strengthening using the Nustep.  Performed 4 intervals of 3 mins each.  First interval with resistance on level 5 and pt using both arms and legs.  She was able to complete 50 steps per minute.  Second set was completed on level 6 using just her LEs, 35 steps per minute completed.  Next interval with just use of the UEs.  Less than 30 steps per minute with resistance set on level 3 as pt could not complete on level 6.  Final set with resistance on level 6 with pt using both UEs and LEs.  Next part of session focused on RUE strengthening.  She stood in front of the mirror using just the RUE to clean off marks that therapist had  made.  Min assist needed for higher up reaching as she could not extend her elbow completely to wipe them off.  Finished session with work on bilateral flexion holding ball in supine as well as tossing and catching beach ball in sitting.  Pt with decreased ability to achieve full elbow extension in the right hand with any of these activities.  Educated pt and spouse on continuing to work on reaching activities over the weekend with emphasis on full elbow extension such as when reaching for an object on her bedside table.    Therapy Documentation Precautions:  Precautions Precautions: Fall Restrictions Weight Bearing Restrictions: No  Pain: Pain Assessment Pain Assessment: No/denies pain ADL: See Function Navigator for Current Functional Status.   Therapy/Group: Individual Therapy  Jacqulynn Shappell OTR/L 04/13/2015, 4:20 PM

## 2015-04-13 NOTE — Progress Notes (Signed)
Social Work Patient ID: Darlene Murray, female   DOB: 1922/02/29, 81 y.o.   MRN: [id number] Met with pt and husband who was here to attend therapies with pt, to answer their questions regarding follow up therapies and equipment. Husband aware plan for target discharge date 10/14 and her goal levels. Both are pleased with how well pt is progressing and getting stronger. Will work toward discharge and husband to continue to participate in therapies with pt.

## 2015-04-14 ENCOUNTER — Inpatient Hospital Stay (HOSPITAL_COMMUNITY): Payer: Medicare Other | Admitting: Occupational Therapy

## 2015-04-14 ENCOUNTER — Inpatient Hospital Stay (HOSPITAL_COMMUNITY): Payer: Medicare Other | Admitting: Physical Therapy

## 2015-04-14 NOTE — Progress Notes (Signed)
Occupational Therapy Session Note  Patient Details  Name: Darlene Murray MRN: [id number] Date of Birth: Apr 27, 1922  Today's Date: 04/14/2015 OT Individual Time: 1100-1200  1st session;   OT Individual Time Calculation (min): 60 min    Short Term Goals: Week 1:  OT Short Term Goal 1 (Week 1): Pt will perform toilet transfers with RW and 3:1 with supervision. OT Short Term Goal 2 (Week 1): Pt will perform shower transfers to walk-in shower with supervision. OT Short Term Goal 3 (Week 1): Pt will perform LB dressing and bathing with supervision sit to stand. OT Short Term Goal 4 (Week 1): Pt will perform RUE shoulder exercises with supervision following handout.   Week 2:     Skilled Therapeutic Interventions/Progress Updates:     1st session:  Skilled OT intervention with treatment focus on the following:    Transferred from recliner to wc.  Propelled wc to gym.  Use nustep at 1.  4 wkload fro 45min 25 sec with arm and legs; 2. Used legs at 4 wkload for 3 mins at 50 steps per minute for 152 steps ; 3.  Used arms at 2 wkload for 3 mins at 53-60 steps per minute for 167 steps.   Pt. Ambulated 150 feet from gym to her room with RW and min to SBA.    2nd session:  Time:  0940-7680  (45 min) Pain:none Individual session  Addressed functional mobitily and balance in washing dishes, and transfers to toilet.  Pt used RW in kitchen with min assist while doing dishes.  She ambulated to bathroom and was continient of urine.  Transferred to toilet with min assist.  Martin Majestic over adaptations for shower stall for home safety.  Pt. Ambulated back to room with cues for heel toe stride.    Therapy Documentation Precautions:  Precautions Precautions: Fall Restrictions Weight Bearing Restrictions: No    Vital Signs: Oxygen Therapy SpO2: 94 % O2 Device: Not Delivered Pain:  none          See Function Navigator for Current Functional Status.   Therapy/Group: Individual Therapy  Lisa Roca 04/14/2015, 12:26 PM

## 2015-04-14 NOTE — Plan of Care (Signed)
Problem: RH BLADDER ELIMINATION Goal: RH STG MANAGE BLADDER WITH ASSISTANCE STG Manage Bladder With mod Assistance  Outcome: Not Progressing Requiring I&O cath x1 during shift

## 2015-04-14 NOTE — Progress Notes (Signed)
Darlene Murray is a 81 y.o. female 1921/11/07 [id number]  Subjective: No new complaints. No new problems. Slept well. Feeling OK.  Objective: Vital signs in last 24 hours: Temp:  [97.7 F (36.5 C)-98 F (36.7 C)] 97.7 F (36.5 C) (10/08 0616) Pulse Rate:  [67-74] 67 (10/08 0616) Resp:  [18-20] 18 (10/08 0616) BP: (124-133)/(62-63) 133/62 mmHg (10/08 0616) SpO2:  [94 %-99 %] 94 % (10/08 0908) Weight change:  Last BM Date: 04/11/15  Intake/Output from previous day: 10/07 0701 - 10/08 0700 In: 720 [P.O.:720] Out: 875 [Urine:875] Last cbgs: CBG (last 3)  No results for input(s): GLUCAP in the last 72 hours.   Physical Exam General: No apparent distress   HEENT: not dry Lungs: Normal effort. Lungs clear to auscultation, no crackles or wheezes. Cardiovascular: Regular rate and rhythm, no edema Abdomen: S/NT/ND; BS(+) Musculoskeletal:  unchanged Neurological: No new neurological deficits Wounds: N/A    Skin: clear  Aging changes Mental state: Alert, cooperative    Lab Results: BMET    Component Value Date/Time   NA 139 04/10/2015 1551   K 4.4 04/10/2015 1551   CL 104 04/10/2015 1551   CO2 27 04/10/2015 1551   GLUCOSE 127* 04/10/2015 1551   BUN 15 04/10/2015 1551   CREATININE 1.06* 04/10/2015 1551   CALCIUM 9.3 04/10/2015 1551   GFRNONAA 44* 04/10/2015 1551   GFRAA 52* 04/10/2015 1551   CBC    Component Value Date/Time   WBC 7.6 04/10/2015 1551   RBC 4.77 04/10/2015 1551   RBC 4.48 11/18/2008 1700   HGB 13.9 04/10/2015 1551   HCT 42.2 04/10/2015 1551   PLT 250 04/10/2015 1551   MCV 88.5 04/10/2015 1551   MCH 29.1 04/10/2015 1551   MCHC 32.9 04/10/2015 1551   RDW 15.4 04/10/2015 1551   LYMPHSABS 1.1 04/10/2015 1551   MONOABS 0.8 04/10/2015 1551   EOSABS 0.4 04/10/2015 1551   BASOSABS 0.0 04/10/2015 1551    Studies/Results: No results found.  Medications: I have reviewed the patient's current medications.  Assessment/Plan:  1. 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 DVT Prophylaxis/Anticoagulation: SCDs. Monitor for any signs of DVT 3. Pain Management: Tylenol as needed 4. Steroid-dependent asthma. Continue prednisone as well as Dulera 5. Neuropsych: This patient is capable of making decisions on her own behalf. 6. Skin/Wound Care: Routine skin checks, IV site ok but no longer needs will d/c 7. Fluids/Electrolytes/Nutrition: Routine I&O with follow-up bmet normal 8. Hyperlipidemia. Pravachol 9. Hypoalbuminemia will add beneprotein 10. Constipation adjust laxatives, add dulc supp prn 11. Urinary retention 2 sm voids, d/c trazodone, 12. Poss UTI UA neg and cx + for enterococcus, on Amoxil  Length of stay, days: 4  Walker Kehr , MD 04/14/2015, 9:21 AM

## 2015-04-14 NOTE — Progress Notes (Addendum)
Physical Therapy Note  Patient Details  Name: Darlene Murray MRN: [id number] Date of Birth: [birth year] Today's Date: 04/14/2015    Time: 149-702 63 minutes  1:1 No c/o pain.  Pt requested help into restroom.  Pt able to gait into bathroom with RW and close supervision.  Supervision for toileting and hygiene tasks.  Cues for hand placement with sit to stand.  Gait 2 x 120' with RW with close supervision, min A when fatigued, cues to stay close to RW especially during obstacle negotiation.  Standing balance squat and reach task with wt shifting and R elbow extension.  Pt requires frequent rests due to R UE fatigue.  Standing on foam static stance 3 x 10 sec with close supervision.  [place] exercise program initiated with mini squats, hip abd, HS curls and LAQ.  Pt unable to tolerate heel raises due to toe pain.  Pt requires frequent rests but is motivated to participate in treatment.   Time: 1500-1530 30 minutes  1:1 No c/o pain.  Treatment session focused on furniture transfers and problem solving pt's home office.  Pt performed sit to stand from couch and simulated kitchen chair with supervision.  Pt performed sit <> stand multiple attempts from rolling/swivel office chair with assist to steady chair.  Pt/husband educated on fall risk of rolling/swivel chair and both are aware that if she sits in this she will need assist each time she gets up. Attempted rollator for practice of locking brakes and sitting/swiveling, pt requires min A to control rollator. Pt/husband in agreement that RW is best option at this time and pt's husband is going to look for a more safe chair for the office.  [name] 04/14/2015, 8:54 AM

## 2015-04-15 ENCOUNTER — Inpatient Hospital Stay (HOSPITAL_COMMUNITY): Payer: Medicare Other | Admitting: Occupational Therapy

## 2015-04-15 DIAGNOSIS — J4520 Mild intermittent asthma, uncomplicated: Secondary | ICD-10-CM

## 2015-04-15 NOTE — Progress Notes (Signed)
Darlene Murray is a 81 y.o. female September 24, 1921 [id number]  Subjective:  No new problems. Slept well. Feeling OK.  Objective: Vital signs in last 24 hours: Temp:  [98 F (36.7 C)] 98 F (36.7 C) (10/09 0534) Pulse Rate:  [72-81] 78 (10/09 0535) Resp:  [16-18] 18 (10/09 0534) BP: (110-123)/(48-60) 110/58 mmHg (10/09 0535) SpO2:  [94 %-98 %] 94 % (10/09 0850) Weight change:  Last BM Date: 04/15/15  Intake/Output from previous day: 10/08 0701 - 10/09 0700 In: 840 [P.O.:840] Out: 330 [Urine:330] Last cbgs: CBG (last 3)  No results for input(s): GLUCAP in the last 72 hours.   Physical Exam General: No apparent distress   HEENT: not dry Lungs: Normal effort. Lungs clear to auscultation, no crackles or wheezes. Cardiovascular: Regular rate and rhythm, no edema Abdomen: S/NT/ND; BS(+) Musculoskeletal:  unchanged Neurological: No new neurological deficits Wounds: N/A    Skin: clear  Aging changes Mental state: Alert, cooperative    Lab Results: BMET    Component Value Date/Time   NA 139 04/10/2015 1551   K 4.4 04/10/2015 1551   CL 104 04/10/2015 1551   CO2 27 04/10/2015 1551   GLUCOSE 127* 04/10/2015 1551   BUN 15 04/10/2015 1551   CREATININE 1.06* 04/10/2015 1551   CALCIUM 9.3 04/10/2015 1551   GFRNONAA 44* 04/10/2015 1551   GFRAA 52* 04/10/2015 1551   CBC    Component Value Date/Time   WBC 7.6 04/10/2015 1551   RBC 4.77 04/10/2015 1551   RBC 4.48 11/18/2008 1700   HGB 13.9 04/10/2015 1551   HCT 42.2 04/10/2015 1551   PLT 250 04/10/2015 1551   MCV 88.5 04/10/2015 1551   MCH 29.1 04/10/2015 1551   MCHC 32.9 04/10/2015 1551   RDW 15.4 04/10/2015 1551   LYMPHSABS 1.1 04/10/2015 1551   MONOABS 0.8 04/10/2015 1551   EOSABS 0.4 04/10/2015 1551   BASOSABS 0.0 04/10/2015 1551    Studies/Results: No results found.  Medications: I have reviewed the patient's current medications.  Assessment/Plan:  1. Functional deficits secondary to bilateral hemorrhagic  infarct suspect embolic right ponto-cerebellar and left occipital 2. DVT Prophylaxis/Anticoagulation: SCDs. Monitor for any signs of DVT 3. Pain Management: Tylenol as needed 4. Steroid-dependent asthma. Continue prednisone as well as Dulera 5. Neuropsych: This patient is capable of making decisions on her own behalf. 6. Skin/Wound Care: Routine skin checks, IV site ok but no longer needs will d/c 7. Fluids/Electrolytes/Nutrition: Routine I&O with follow-up bmet normal 8. Hyperlipidemia. Pravachol 9. Hypoalbuminemia will add beneprotein 10. Constipation adjust laxatives, add dulc supp prn 11. Urinary retention 2 sm voids, d/c trazodone,   Length of stay, days: 5  Walker Kehr , MD 04/15/2015, 1:08 PM

## 2015-04-15 NOTE — Progress Notes (Signed)
Occupational Therapy Session Note  Patient Details  Name: Darlene Murray MRN: [id number] Date of Birth: 04-Jan-1922  Today's Date: 04/15/2015 OT Individual Time:  -   [address]  (50 min)      Short Term Goals: Week 1:  OT Short Term Goal 1 (Week 1): Pt will perform toilet transfers with RW and 3:1 with supervision. OT Short Term Goal 2 (Week 1): Pt will perform shower transfers to walk-in shower with supervision. OT Short Term Goal 3 (Week 1): Pt will perform LB dressing and bathing with supervision sit to stand. OT Short Term Goal 4 (Week 1): Pt will perform RUE shoulder exercises with supervision following handout.       Skilled Therapeutic Interventions/Progress Updates:     1:1 No c/o pain. Pt requested to shower.   Pt able to gait into bathroom with RW and close supervision. Supervision for toileting and hygiene tasks. Cues for hand placement with sit to stand. Pt ambulated to shower with cues for turning and keeping body inside walker.  RW with close supervision.   Pt. Completed bathing with sit to stand for peri area with supervision.  Ambulated to toilet and donned clothes with set up assist.   Ambulated to recliner and did hair and lotion.  Practice right sho flex to 90 degrees with elbow extension.  Educated pt on doing throughout the day.  Pt verbalized unerstanding.    Precautions Precautions: Fall Restrictions Weight Bearing Restrictions: No      Pain: Pain Assessment Pain Assessment: No/denies pain ADL:   Exercises: see above   :    See Function Navigator for Current Functional Status.   Therapy/Group: Individual Therapy  Lisa Roca 04/15/2015, 7:46 AM

## 2015-04-16 ENCOUNTER — Inpatient Hospital Stay (HOSPITAL_COMMUNITY): Payer: Medicare Other | Admitting: Occupational Therapy

## 2015-04-16 ENCOUNTER — Inpatient Hospital Stay (HOSPITAL_COMMUNITY): Payer: Medicare Other | Admitting: *Deleted

## 2015-04-16 DIAGNOSIS — K5901 Slow transit constipation: Secondary | ICD-10-CM

## 2015-04-16 NOTE — Progress Notes (Signed)
Occupational Therapy Session Note  Patient Details  Name: Darlene Murray MRN: [id number] Date of Birth: [birth year]  Today's Date: 04/16/2015 OT Individual Time: 1003-1102 OT Individual Time Calculation (min): 59 min    Short Term Goals: Week 1:  OT Short Term Goal 1 (Week 1): Pt will perform toilet transfers with RW and 3:1 with supervision. OT Short Term Goal 2 (Week 1): Pt will perform shower transfers to walk-in shower with supervision. OT Short Term Goal 3 (Week 1): Pt will perform LB dressing and bathing with supervision sit to stand. OT Short Term Goal 4 (Week 1): Pt will perform RUE shoulder exercises with supervision following handout.    Skilled Therapeutic Interventions/Progress Updates:    Session 1:  Began session by discussing bathroom setup and adjustments with the pt and pt's husband as he brought pictures from home.  Discussed possible options for setup of shower seat and grab bars as well as use of 3:1.  Had pt ambulate to the therapy gym with the RW and close supervision.  Pt needing min instructional cueing to relax her shoulders, stand up tall, and to stay inside of the walker.  Pt tends to stay on the left side of the walker with her LEs and hips but demonstrates a slight lean to the right.  Worked on dynamic standing balance in the gym having pt stand and reach laterally to the left and right to increase weightshifts.  Pt able to complete but needs min assist for balance.  Worked on active right shoulder flexion and elbow extension while placing clothespins and while removing them.  Min assist needed at the right shoulder to complete task.  Finished session by working on bilateral shoulder flexion while holding a ball in unsupported sitting.  Pt still unable to isolate shoulder flexion with elbow extension.  Tends to lean posteriorly and maintain 15 degrees or more elbow flexion in the right arm.    Session 2:  Pt worked on use of the Ergonometer to increase functional strength  in the RUE.  Had pt perform 3 intervals of 3 mins each.  First interval lasted for 3 mins with resistance set on level 8, using BUEs.  Second and third intervals were for 3 mins each as well with resistance set at 3 and then 5, using just the RUE.  Pt with arm fatigue after completion of exercise.  Finished session by having pt practice using RW to scoot back desk chair and then sit and reposition.  Pt spends a lot of time on the computer at home and do not recommend use of a rolling chair at this time, so working on different options.  Returned to room via walker to conclude session.  Pt's husband present as well.    Therapy Documentation Precautions:  Precautions Precautions: Fall Restrictions Weight Bearing Restrictions: No  Pain:   No report of pain  ADL: See Function Navigator for Current Functional Status.   Therapy/Group: Individual Therapy  Aseel Truxillo OTR/L 04/16/2015, 4:11 PM

## 2015-04-16 NOTE — Progress Notes (Addendum)
81 y.o. Darlene Murray is a 81 y.o. right handed female with history of steroid-dependent for asthma and hyperlipidemia. Patient lives with spouse and was independent prior to admission. Spouse is retired and able to care for pt on discharge. Presented 04/06/2015 with nausea and vomiting, dizziness, decrease in balance and generalized weakness as well as some mild slurred speech. MRI imaging revealed acute moderate sized nonhemorrhagic infarct posterior right upper and mid pons and adjacent right cerebellum. Acute tiny nonhemorrhagic infarct left thalamic infarct. Acute nonhemorrhagic left occipital lobe infarct. CTA of head and neck with no significant stenosis or occlusion. Echocardiogram results are pending. Patient did not receive TPA. Neurology consulted maintain on aspirin 325 mg daily for CVA prophylaxis  Subjective/Complaints: Voiding more No BM for couple days but no abd pain   ROS-  + constip, neg for bladder issues , no joint pains, no SOB   Objective: Vital Signs: Blood pressure 131/70, pulse 61, temperature 97.8 F (36.6 C), temperature source Oral, resp. rate 18, weight 75.388 kg (166 lb 3.2 oz), SpO2 100 %. No results found. No results found for this or any previous visit (from the past 72 hour(s)).    General: No acute distress Mood and affect are appropriate Heart: Regular rate and rhythm no rubs murmurs or extra sounds Lungs: Clear to auscultation, breathing unlabored, no rales or wheezes Abdomen: Positive bowel sounds, soft nontender to palpation, nondistended Extremities: No clubbing, cyanosis, or edema Skin: No evidence of breakdown, no evidence of rash Neurologic: Cranial nerves II through XII intact, motor strength is 5/5 in bilateral deltoid, bicep, tricep, grip, hip flexor, knee extensors, ankle dorsiflexor and plantar flexor Sensory exam normal sensation to light touch and proprioception in bilateral upper and lower extremities Cerebellar exam normal finger  to nose to finger on left , mild dysmetria on right, some left visual neglect on confrontation but not cosistent, no evidence of nystamus Musculoskeletal: Full range of motion in all 4 extremities. No joint swelling   Assessment/Plan: 1. 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 Physiatrist is providing close team supervision and 24 hour management of active medical problems listed below. Physiatrist and rehab team continue to assess barriers to discharge/monitor patient progress toward functional and medical goals. FIM: Function - Bathing Position: Shower Body parts bathed by patient: Right arm, Left arm, Chest, Abdomen, Front perineal area, Buttocks, Right upper leg, Left upper leg, Back, Right lower leg, Left lower leg Body parts bathed by helper: Left lower leg, Right lower leg Assist Level: Touching or steadying assistance(Pt > 75%)  Function- Upper Body Dressing/Undressing What is the patient wearing?: Pull over shirt/dress, Bra, Button up shirt Bra - Perfomed by patient: Thread/unthread right bra strap, Thread/unthread left bra strap, Hook/unhook bra (pull down sports bra) Pull over shirt/dress - Perfomed by patient: Thread/unthread right sleeve, Thread/unthread left sleeve, Put head through opening, Pull shirt over trunk Button up shirt - Perfomed by patient: Thread/unthread right sleeve, Thread/unthread left sleeve, Pull shirt around back, Button/unbutton shirt Function - Lower Body Dressing/Undressing What is the patient wearing?: Pants, Socks, Non-skid slipper socks Position: Sitting EOB Pants- Performed by patient: Thread/unthread right pants leg, Thread/unthread left pants leg, Pull pants up/down Pants- Performed by helper: Fasten/unfasten pants Non-skid slipper socks- Performed by patient:  Don/doff right sock, Don/doff left sock Assist for footwear: Setup Assist for lower body dressing: Supervision or  verbal cues  Function - Toileting Toileting activity did not occur: No continent bowel/bladder event Toileting steps completed by patient: Adjust clothing prior to toileting, Performs perineal hygiene, Adjust clothing after toileting Toileting steps completed by helper: Performs perineal hygiene, Adjust clothing after toileting Toileting Assistive Devices: Grab bar or rail Assist level: Supervision or verbal cues  Function - Air cabin crew transfer assistive device: Grab bar, Walker Assist level to toilet: Supervision or verbal cues Assist level from toilet: Supervision or verbal cues  Function - Chair/bed transfer Chair/bed transfer method: Ambulatory Chair/bed transfer assist level: Touching or steadying assistance (Pt > 75%) Chair/bed transfer assistive device: Armrests Chair/bed transfer details: Verbal cues for precautions/safety, Verbal cues for technique  Function - Locomotion: Wheelchair Will patient use wheelchair at discharge?: No Max wheelchair distance: 30 Assist Level: Touching or steadying assistance (Pt > 75%) Wheel 50 feet with 2 turns activity did not occur:  (will be an ambulator at d/c) Function - Locomotion: Ambulation Assistive device: Walker-rolling Max distance: 200 Assist level: Touching or steadying assistance (Pt > 75%) Assist level: Touching or steadying assistance (Pt > 75%) Walk 50 feet with 2 turns activity did not occur: Safety/medical concerns (weakness and fear of falling) Assist level: Touching or steadying assistance (Pt > 75%) Walk 10 feet on uneven surfaces activity did not occur: Safety/medical concerns (weakness and fear of falling) Assist level: Touching or steadying assistance (Pt > 75%)  Function - Comprehension Comprehension: Auditory Comprehension assist level: Follows complex conversation/direction with extra  time/assistive device  Function - Expression Expression: Verbal Expression assist level: Expresses complex 90% of the time/cues < 10% of the time  Function - Social Interaction Social Interaction assist level: Interacts appropriately 90% of the time - Needs monitoring or encouragement for participation or interaction.  Function - Problem Solving Problem solving assist level: Solves basic 90% of the time/requires cueing < 10% of the time  Function - Memory Memory assist level: Recognizes or recalls 90% of the time/requires cueing < 10% of the time Patient normally able to recall (first 3 days only): Current season, That he or she is in a hospital, Staff names and faces Medical Problem List and Plan: 1. Functional deficits secondary to bilateral hemorrhagic  infarct suspect embolic right ponto-cerebellar and left occipital 2.  DVT Prophylaxis/Anticoagulation: SCDs. Monitor for any signs of DVT 3. Pain Management: Tylenol as needed 4. Steroid-dependent asthma. Continue prednisone as well as Dulera, no wheezing on exam 5. Neuropsych: This patient is capable of making decisions on her own behalf. 6. Skin/Wound Care: Routine skin checks, 7. Fluids/Electrolytes/Nutrition: Routine I&O with follow-up bmet normal,taking 50-100%meals 8. Hyperlipidemia. Pravachol 9.  Hypoalbuminemia will add beneprotein 10.  Constipation adjust laxatives, add dulc supp prn, last BM 10/8, furhter adjustment 11.  Urinary retention 2 sm voids, d/c trazodone,voiding cont 12. Poss UTI UA neg and cx + for enterococcus, on Amoxil LOS (Days) 6 A FACE TO FACE EVALUATION WAS PERFORMED  Emmarie Sannes E 04/16/2015, 7:17 AM

## 2015-04-16 NOTE — Progress Notes (Signed)
Physical Therapy Session Note  Patient Details  Name: Darlene Murray MRN: [id number] Date of Birth: [birth year]  Today's Date: 04/16/2015 PT Individual Time: [phone] and 14:00-15:00 (65min)  PT Individual Time Calculation (min): 60 min   Short Term Goals: Week 1:  PT Short Term Goal 1 (Week 1): pt will transfer consistenlty with min assist to a variety of chairs and simulated car PT Short Term Goal 2 (Week 1): pt will perform gait x 150' level tile in controlled environmnt with min assist and LRAD  including turns R and L PT Short Term Goal 3 (Week 1): pt will ascend and descend 12 steps 2 rails with supervision PT Short Term Goal 4 (Week 1): pt will ascend/descend ramp and curb with min assist with LRAD  PT Short Term Goal 5 (Week 1): pt will perform gait x 50' in home setting over carpet with min assist and LRAD:     Skilled Therapeutic Interventions/Progress Updates:  Tx focused on functional mobility training, gait with RW, and NMR via forced use, manual facilitation, and multi-modal cues for balance retraining.   Dynamic sitting and standing balance performed during functinoal tasks as well as standing on foam and firm surfaces for fall risk reduction.   Pt instructed in [place] HEP for fall risk reduction with 2# ankle weight and demo cues throughout. No weights in standing was a good challenge level for her. Discussed home performance. Pt not recall this activity from previous therapies. Toe raise avoided due to great toe pain.   Gait with RW in controlled and room settings with up to [place] for steadying during turns and busy environments, up to 158' and cues for scapular depression and increasing step width.   5x STS = 38 sec today with UE support from arm chair. Educated pt on functional implication.   Curb step instruction with RW complete with demo and Min A for steadying x2 for community access.    Second tx focused on gait training with RW, activity tolerance, therex via  Nustep, and Berg Balance test. Pt c/o L great toe discomfort. Upon inspection, her toe nail has split, but no signs of infection. RN made aware and will check later. Spouse present and discussed home safety, equipment, and guarding at S level throughout. He cued her very appropriately during transfers and gait. He brought her RW from home, which is a youth size, and slightly too short for pt. CSW made aware to see if she can get a proper size for upright posture. Educated pt/spouse on CVA risk reduction.   Gait in controlled setting Min A>>S 3x150' and over carpet in day room. Pt continues to need S cues for posture and step width, min-guard only during turns and crowded spaces this afternoon.   Therex via Nustep x10 min at level 4, goal of 60 spm. Pt was only able to maintain 45 spm  Administered Berg balance test, score 31/56, indicating high falls risk. Educated pt/family on functional implication.  Hand off to OT        Therapy Documentation Precautions:  Precautions Precautions: Fall Restrictions Weight Bearing Restrictions: No    Pain: none     See Function Navigator for Current Functional Status.   Therapy/Group: Individual Therapy  Kennieth Rad, PT, DPT   04/16/2015, 11:38 AM

## 2015-04-17 ENCOUNTER — Inpatient Hospital Stay (HOSPITAL_COMMUNITY): Payer: Medicare Other | Admitting: Occupational Therapy

## 2015-04-17 ENCOUNTER — Inpatient Hospital Stay (HOSPITAL_COMMUNITY): Payer: Medicare Other | Admitting: Physical Therapy

## 2015-04-17 DIAGNOSIS — R338 Other retention of urine: Secondary | ICD-10-CM

## 2015-04-17 MED ORDER — TAMSULOSIN HCL 0.4 MG PO CAPS
0.4000 mg | ORAL_CAPSULE | Freq: Every day | ORAL | Status: DC
  Administered 2015-04-17 – 2015-04-18 (×2): 0.4 mg via ORAL
  Filled 2015-04-17 (×2): qty 1

## 2015-04-17 MED ORDER — POLYETHYLENE GLYCOL 3350 17 G PO PACK
17.0000 g | PACK | Freq: Every day | ORAL | Status: DC
  Administered 2015-04-17 – 2015-04-18 (×2): 17 g via ORAL
  Filled 2015-04-17 (×4): qty 1

## 2015-04-17 MED ORDER — BETHANECHOL CHLORIDE 10 MG PO TABS
10.0000 mg | ORAL_TABLET | Freq: Three times a day (TID) | ORAL | Status: DC
  Administered 2015-04-17 – 2015-04-20 (×10): 10 mg via ORAL
  Filled 2015-04-17 (×10): qty 1

## 2015-04-17 NOTE — Progress Notes (Signed)
Subjective/Complaints: Still requiring cath    ROS-  + constip, neg for bladder issues , no joint pains, no SOB   Objective: Vital Signs: Blood pressure 157/60, pulse 66, temperature 97.9 F (36.6 C), temperature source Oral, resp. rate 17, weight 75.388 kg (166 lb 3.2 oz), SpO2 100 %. No results found. No results found for this or any previous visit (from the past 72 hour(s)).    General: No acute distress Mood and affect are appropriate Heart: Regular rate and rhythm no rubs murmurs or extra sounds Lungs: Clear to auscultation, breathing unlabored, no rales or wheezes Abdomen: Positive bowel sounds, soft nontender to palpation, nondistended Extremities: No clubbing, cyanosis, or edema Skin: No evidence of breakdown, no evidence of rash Neurologic: Cranial nerves II through XII intact, motor strength is 5/5 in bilateral deltoid, bicep, tricep, grip, hip flexor, knee extensors, ankle dorsiflexor and plantar flexor Sensory exam normal sensation to light touch and proprioception in bilateral upper and lower extremities Cerebellar exam normal finger to nose to finger on left , mild dysmetria on right, some left visual neglect on confrontation but not cosistent, no evidence of nystamus Musculoskeletal: Full range of motion in all 4 extremities. No joint swelling   Assessment/Plan: 1. 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 Physiatrist is providing close team supervision and 24 hour management of active medical problems listed below. Physiatrist and rehab team continue to assess barriers to discharge/monitor patient progress toward functional and medical goals. FIM: Function - Bathing Position: Shower Body parts bathed by patient: Right arm, Left arm, Chest, Abdomen, Front perineal area,  Buttocks, Right upper leg, Left upper leg, Back, Right lower leg, Left lower leg Body parts bathed by helper: Left lower leg, Right lower leg Assist Level: Touching or steadying assistance(Pt > 75%)  Function- Upper Body Dressing/Undressing What is the patient wearing?: Pull over shirt/dress, Bra, Button up shirt Bra - Perfomed by patient: Thread/unthread right bra strap, Thread/unthread left bra strap, Hook/unhook bra (pull down sports bra) Pull over shirt/dress - Perfomed by patient: Thread/unthread right sleeve, Thread/unthread left sleeve, Put head through opening, Pull shirt over trunk Button up shirt - Perfomed by patient: Thread/unthread right sleeve, Thread/unthread left sleeve, Pull shirt around back, Button/unbutton shirt Assist Level: Set up, Supervision or verbal cues Set up : To obtain clothing/put away Function - Lower Body Dressing/Undressing What is the patient wearing?: Pants, Socks, Non-skid slipper socks Position: Sitting EOB Pants- Performed by patient: Thread/unthread right pants leg, Thread/unthread left pants leg, Pull pants up/down Pants- Performed by helper: Fasten/unfasten pants Non-skid slipper socks- Performed by patient: Don/doff right sock, Don/doff left sock Assist for footwear: Setup Assist for lower body dressing: Supervision or verbal cues  Function - Toileting Toileting activity did not occur: No continent bowel/bladder event Toileting steps completed by patient: Adjust clothing prior to toileting, Performs perineal hygiene, Adjust clothing after toileting Toileting steps completed by helper: Performs perineal hygiene, Adjust clothing after toileting Toileting Assistive Devices: Grab bar or rail Assist level: Supervision or verbal cues  Function - Air cabin crew transfer assistive device: Grab bar, Walker Assist level to toilet: Supervision or verbal cues Assist level from toilet: Supervision or verbal cues  Function - Chair/bed  transfer Chair/bed transfer method: Ambulatory Chair/bed transfer assist level: Touching or steadying assistance (Pt > 75%) Chair/bed transfer assistive device: Walker, Armrests Chair/bed transfer details: Verbal  cues for precautions/safety, Verbal cues for technique  Function - Locomotion: Wheelchair Will patient use wheelchair at discharge?: No Max wheelchair distance: 30 Assist Level: Touching or steadying assistance (Pt > 75%) Wheel 50 feet with 2 turns activity did not occur:  (will be an ambulator at d/c) Function - Locomotion: Ambulation Assistive device: Walker-rolling Max distance: 158 Assist level: Touching or steadying assistance (Pt > 75%) Assist level: Touching or steadying assistance (Pt > 75%) Walk 50 feet with 2 turns activity did not occur: Safety/medical concerns (weakness and fear of falling) Assist level: Touching or steadying assistance (Pt > 75%) Assist level: Touching or steadying assistance (Pt > 75%) Walk 10 feet on uneven surfaces activity did not occur: Safety/medical concerns (weakness and fear of falling) Assist level: Touching or steadying assistance (Pt > 75%)  Function - Comprehension Comprehension: Auditory Comprehension assist level: Follows complex conversation/direction with extra time/assistive device  Function - Expression Expression: Verbal Expression assist level: Expresses complex 90% of the time/cues < 10% of the time  Function - Social Interaction Social Interaction assist level: Interacts appropriately 90% of the time - Needs monitoring or encouragement for participation or interaction.  Function - Problem Solving Problem solving assist level: Solves basic 90% of the time/requires cueing < 10% of the time  Function - Memory Memory assist level: Recognizes or recalls 90% of the time/requires cueing < 10% of the time Patient normally able to recall (first 3 days only): Current season, That he or she is in a hospital, Staff names and  faces Medical Problem List and Plan: 1. Functional deficits secondary to bilateral hemorrhagic  infarct suspect embolic right ponto-cerebellar and left occipital 2.  DVT Prophylaxis/Anticoagulation: SCDs. Monitor for any signs of DVT 3. Pain Management: Tylenol as needed 4. Steroid-dependent asthma. Continue prednisone as well as Dulera, no wheezing on exam 5. Neuropsych: This patient is capable of making decisions on her own behalf. 6. Skin/Wound Care: Routine skin checks, 7. Fluids/Electrolytes/Nutrition: Routine I&O with follow-up bmet normal,taking 50-100%meals 8. Hyperlipidemia. Pravachol 9.  Hypoalbuminemia will add beneprotein 10.  Constipation adjust laxatives, add dulc supp prn, last BM 10/8, on Senna S 2 BID, add miralax 11.  Urinary retention- requiring ICP, add flomax and urecholine             12. Poss UTI UA neg and cx + for enterococcus, on Amoxil, 5 day course to complete LOS (Days) 7 A FACE TO FACE EVALUATION WAS PERFORMED  Alfie Alderfer E 04/17/2015, 6:17 AM

## 2015-04-17 NOTE — Progress Notes (Signed)
Occupational Therapy Session Note  Patient Details  Name: Madiha Bambrick MRN: [id number] Date of Birth: Jan 11, 1922  Today's Date: 04/17/2015 OT Individual Time: 1400-1530 OT Individual Time Calculation (min): 90 min    Short Term Goals: Week 1:  OT Short Term Goal 1 (Week 1): Pt will perform toilet transfers with RW and 3:1 with supervision. OT Short Term Goal 2 (Week 1): Pt will perform shower transfers to walk-in shower with supervision. OT Short Term Goal 3 (Week 1): Pt will perform LB dressing and bathing with supervision sit to stand. OT Short Term Goal 4 (Week 1): Pt will perform RUE shoulder exercises with supervision following handout.    Skilled Therapeutic Interventions/Progress Updates:    Began session by having pt complete toileting task with supervision including transfer using the RW.  Stood at the sink with supervision as well to wash her hands.  Next, had pt ambulate with the RW to the ADL apartment.  Worked on using the RW to make the bed in the apartment.  Pt able to complete with min assist from therapist to place comforter on the bed.  Rest breaks needed after each step.  Also worked on [employer] by using the [employer] for 10 mins after making up the bed.  Divided exercise up into three, 3 minute segments.  First interval resistance placed on level 3 using just the RUE and peddling backwards.  Pt with increased difficulty completing rotation unless therapist assisted.  Second set had pt peddle forward with the RUE, which she could complete without assist, just using the RUE.  Resistance increased to 5 for last set with pt using both UEs but peddling in reverse.  Next had pt work on folding towels in standing to incorporate RUE functional use and standing balance.  She was able to complete with supervision.  Had her place the towels on the RW and carry back to her room and place on the towel rod and the shelf in the bathroom once she entered the room.  Completed  session with practice on pulling desk chair out and sitting down to simulate computer desk at home.  Pt needing mod instructional cueing and min assist to complete at this time.    Therapy Documentation Precautions:  Precautions Precautions: Fall Restrictions Weight Bearing Restrictions: No  Pain: Pain Assessment Pain Assessment: No/denies pain Pain Score: 0-No pain ADL: See Function Navigator for Current Functional Status.   Therapy/Group: Individual Therapy  Dameian Crisman OTR/L 04/17/2015, 4:00 PM

## 2015-04-17 NOTE — Progress Notes (Signed)
Occupational Therapy Session Note  Patient Details  Name: Darlene Murray MRN: [id number] Date of Birth: 23-Dec-1921  Today's Date: 04/17/2015 OT Individual Time: 1100-1155 OT Individual Time Calculation (min): 55 min    Short Term Goals: Week 1:  OT Short Term Goal 1 (Week 1): Pt will perform toilet transfers with RW and 3:1 with supervision. OT Short Term Goal 2 (Week 1): Pt will perform shower transfers to walk-in shower with supervision. OT Short Term Goal 3 (Week 1): Pt will perform LB dressing and bathing with supervision sit to stand. OT Short Term Goal 4 (Week 1): Pt will perform RUE shoulder exercises with supervision following handout.    Skilled Therapeutic Interventions/Progress Updates:    Pt seen this session for IADL retraining of simple meal prep along with family education with pt's son.  Pt stated that she really does not cook (her spouse does all the cooking) and they go out to eat often. She was willing to work on a task that would challenge her balance and R hand coordination in the kitchen. Pt used bathroom and then ambulated with RW to ADL apt. Practiced sit>< stands from low couch with cues for foot placement.  She needed some cuing in the kitchen to gather supplies needed to scramble an egg. With cooking, occasional cues to focus on using R hand. She had fair safety awareness and was able to sequence through tasks with min cues.  She did need 2 resting sit breaks stating her legs were very fatigued.  Mild trunkal ataxia noted with ant/posterior swaying when standing at counter using BUE in task.  Pt stated she does not plan to cook at home, if pt needs to it is recommended that her spouse or son provide supervision.  Pt ambulated back to room with RW with close S.  Pt resting in recliner with all needs met.  LTG of simple meal prep with S met.  Therapy Documentation Precautions:  Precautions Precautions: Fall Restrictions Weight Bearing Restrictions: No  Pain: Pain  Assessment Pain Assessment: No/denies pain ADL:  See Function Navigator for Current Functional Status.   Therapy/Group: Individual Therapy  [place] 04/17/2015, 10:18 AM

## 2015-04-17 NOTE — Progress Notes (Addendum)
Physical Therapy Weekly Progress Note  Patient Details  Name: Darlene Murray MRN: [id number] Date of Birth: 11-07-1921  Beginning of progress report period: April 10, 2015 End of progress report period: April 17, 2015  Today's Date: 04/17/2015 PT Individual Time: 1000-1100 PT Individual Time Calculation (min): 60 min   Patient has met 5 of 5 short term goals.  Patient is at supervision level overall with use of RW for transfers, gait, stairs. Continues to be limited by endurance, balance limiting access and safety within the home.  Patient continues to demonstrate the following deficits: decreased cardiovascular endurance, impaired balance, strength deficits and therefore will continue to benefit from skilled PT intervention to enhance overall performance with activity tolerance, balance, postural control, ability to compensate for deficits and awareness.  Patient progressing toward long term goals..  Continue plan of care.  PT Short Term Goals Week 1:  PT Short Term Goal 1 (Week 1): pt will transfer consistenlty with min assist to a variety of chairs and simulated car PT Short Term Goal 1 - Progress (Week 1): Met PT Short Term Goal 2 (Week 1): pt will perform gait x 150' level tile in controlled environmnt with min assist and LRAD  including turns R and L PT Short Term Goal 2 - Progress (Week 1): Met PT Short Term Goal 3 (Week 1): pt will ascend and descend 12 steps 2 rails with supervision PT Short Term Goal 3 - Progress (Week 1): Met PT Short Term Goal 4 (Week 1): pt will ascend/descend ramp and curb with min assist with LRAD  PT Short Term Goal 4 - Progress (Week 1): Met PT Short Term Goal 5 (Week 1): pt will perform gait x 50' in home setting over carpet with min assist and LRAD PT Short Term Goal 5 - Progress (Week 1): Met Week 2:  PT Short Term Goal 1 (Week 2): = LTG due to estimated LOS  Skilled Therapeutic Interventions/Progress Updates:    Pt received seated in recliner  with son present; no c/o pain and agreeable to treatment. Stand pivot transfer recliner>w/c with close supervision and RW. W/c propulsion with BUEs x75' for aerobic endurance and UE strength; increased time to perform due to strength/endurance deficits. Car transfer performed with RW and supervision. Gait for 190' x2 trials with RW and close supervision; min verbal cues for upright posture. Performed ascent/descent of 12 6-inch stairs with close supervision and BUE on handrails. Gait in apartment to simulate home environment, including furniture transfer, bed transfer and bed mobility, all performed with supervision. Requires several attempts to get up from couch due to low surface, however able to perform with supervision and min cues for hand placement. Performed curb and ramp with demonstration and min verbal cues, CGA. Gait for 2 trials of 200' with RW and supervision. Pt remained seated in recliner at completion of session with all needs in reach.   Therapy Documentation Precautions:  Precautions Precautions: Fall Restrictions Weight Bearing Restrictions: No Pain: Pain Assessment Pain Assessment: No/denies pain Pain Score: 0-No pain   See Function Navigator for Current Functional Status.  Therapy/Group: Individual Therapy  Luberta Mutter 04/17/2015, 12:36 PM

## 2015-04-18 ENCOUNTER — Inpatient Hospital Stay (HOSPITAL_COMMUNITY): Payer: Medicare Other

## 2015-04-18 ENCOUNTER — Inpatient Hospital Stay (HOSPITAL_COMMUNITY): Payer: Medicare Other | Admitting: Occupational Therapy

## 2015-04-18 MED ORDER — SENNOSIDES-DOCUSATE SODIUM 8.6-50 MG PO TABS
3.0000 | ORAL_TABLET | Freq: Two times a day (BID) | ORAL | Status: DC
  Administered 2015-04-19: 3 via ORAL
  Filled 2015-04-18 (×3): qty 3

## 2015-04-18 NOTE — Progress Notes (Signed)
Social Work Patient ID: Darlene Murray, female   DOB: 01/30/1922, 81 y.o.   MRN: [id number] Met with pt and husband to discuss team conference progression toward her goals and discharge still 10/14. Discussed equipment needs and follow up, agreeable to equipment being ordered and Aware tub seat is not covered by insurance. Pt requests home health follow up therapies. Will work toward discharge for Friday.

## 2015-04-18 NOTE — Progress Notes (Signed)
Subjective/Complaints:  Pt without new issues overnite, no pain problems Discussed with RN- now voiding without need for cath   ROS-  + constip, neg for bladder issues , no joint pains, no SOB   Objective: Vital Signs: Blood pressure 112/68, pulse 84, temperature 97.8 F (36.6 C), temperature source Oral, resp. rate 16, weight 75.388 kg (166 lb 3.2 oz), SpO2 100 %. No results found. No results found for this or any previous visit (from the past 72 hour(s)).    General: No acute distress Mood and affect are appropriate Heart: Regular rate and rhythm no rubs murmurs or extra sounds Lungs: Clear to auscultation, breathing unlabored, no rales or wheezes Abdomen: Positive bowel sounds, soft nontender to palpation, nondistended Extremities: No clubbing, cyanosis, or edema Skin: No evidence of breakdown, no evidence of rash Neurologic: Cranial nerves II through XII intact, motor strength is 5/5 in bilateral deltoid, bicep, tricep, grip, hip flexor, knee extensors, ankle dorsiflexor and plantar flexor Sensory exam normal sensation to light touch and proprioception in bilateral upper and lower extremities Cerebellar exam normal finger to nose to finger on left , mild dysmetria on right, some left visual neglect on confrontation but not cosistent, no evidence of nystamus Musculoskeletal: Full range of motion in all 4 extremities. No joint swelling   Assessment/Plan: 1. 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 Physiatrist is providing close team supervision and 24 hour management of active medical problems listed below. Physiatrist and rehab team continue to assess barriers to discharge/monitor patient progress toward functional and medical goals. Team conference today please see physician documentation  under team conference tab, met with team face-to-face to discuss problems,progress, and goals. Formulized individual treatment plan based on medical history, underlying problem and comorbidities.FIM: Function - Bathing Position: Shower Body parts bathed by patient: Right arm, Left arm, Chest, Abdomen, Front perineal area, Buttocks, Right upper leg, Left upper leg, Back, Right lower leg, Left lower leg Body parts bathed by helper: Left lower leg, Right lower leg Assist Level: Touching or steadying assistance(Pt > 75%)  Function- Upper Body Dressing/Undressing What is the patient wearing?: Pull over shirt/dress, Bra, Button up shirt Bra - Perfomed by patient: Thread/unthread right bra strap, Thread/unthread left bra strap, Hook/unhook bra (pull down sports bra) Pull over shirt/dress - Perfomed by patient: Thread/unthread right sleeve, Thread/unthread left sleeve, Put head through opening, Pull shirt over trunk Button up shirt - Perfomed by patient: Thread/unthread right sleeve, Thread/unthread left sleeve, Pull shirt around back, Button/unbutton shirt Assist Level: Set up, Supervision or verbal cues Set up : To obtain clothing/put away Function - Lower Body Dressing/Undressing What is the patient wearing?: Pants, Socks, Non-skid slipper socks Position: Sitting EOB Pants- Performed by patient: Thread/unthread right pants leg, Thread/unthread left pants leg, Pull pants up/down Pants- Performed by helper: Fasten/unfasten pants Non-skid slipper socks- Performed by patient: Don/doff right sock, Don/doff left sock Assist for footwear: Setup Assist for lower body dressing: Supervision or verbal cues  Function - Toileting Toileting activity did not occur: No continent bowel/bladder event Toileting steps completed by patient: Adjust clothing prior to toileting, Performs perineal hygiene, Adjust clothing after toileting Toileting steps completed by helper: Performs perineal hygiene, Adjust clothing  after toileting Toileting Assistive Devices: Grab bar or rail Assist level: Supervision or verbal cues  Function - Air cabin crew transfer assistive device: Landscape architect, Pension scheme manager  level to toilet: Supervision or verbal cues Assist level from toilet: Supervision or verbal cues  Function - Chair/bed transfer Chair/bed transfer method: Ambulatory Chair/bed transfer assist level: Supervision or verbal cues Chair/bed transfer assistive device: Walker, Armrests Chair/bed transfer details: Verbal cues for precautions/safety, Verbal cues for technique  Function - Locomotion: Wheelchair Will patient use wheelchair at discharge?: No Type: Manual Max wheelchair distance: 75 Assist Level: Touching or steadying assistance (Pt > 75%) Wheel 50 feet with 2 turns activity did not occur:  (will be an ambulator at d/c) Assist Level: Touching or steadying assistance (Pt > 75%) Turns around,maneuvers to table,bed, and toilet,negotiates 3% grade,maneuvers on rugs and over doorsills: No Function - Locomotion: Ambulation Assistive device: Walker-rolling Max distance: 190 Assist level: Supervision or verbal cues Assist level: Supervision or verbal cues Walk 50 feet with 2 turns activity did not occur: Safety/medical concerns (weakness and fear of falling) Assist level: Supervision or verbal cues Assist level: Supervision or verbal cues Walk 10 feet on uneven surfaces activity did not occur: Safety/medical concerns (weakness and fear of falling) Assist level: Touching or steadying assistance (Pt > 75%)  Function - Comprehension Comprehension: Auditory Comprehension assist level: Follows complex conversation/direction with extra time/assistive device  Function - Expression Expression: Verbal Expression assist level: Expresses complex 90% of the time/cues < 10% of the time  Function - Social Interaction Social Interaction assist level: Interacts appropriately 90% of the time - Needs  monitoring or encouragement for participation or interaction.  Function - Problem Solving Problem solving assist level: Solves basic 90% of the time/requires cueing < 10% of the time  Function - Memory Memory assist level: Recognizes or recalls 90% of the time/requires cueing < 10% of the time Patient normally able to recall (first 3 days only): Current season, That he or she is in a hospital, Staff names and faces Medical Problem List and Plan: 1. Functional deficits secondary to bilateral hemorrhagic  infarct suspect embolic right ponto-cerebellar and left occipital 2.  DVT Prophylaxis/Anticoagulation: SCDs. Monitor for any signs of DVT 3. Pain Management: Tylenol as needed 4. Steroid-dependent asthma. Continue prednisone as well as Dulera, no wheezing on exam 5. Neuropsych: This patient is capable of making decisions on her own behalf. 6. Skin/Wound Care: Routine skin checks, 7. Fluids/Electrolytes/Nutrition: Routine I&O with follow-up bmet normal,taking 80-100%meals 8. Hyperlipidemia. Pravachol 9.  Hypoalbuminemia will add beneprotein 10.  Constipation adjust laxatives, add dulc supp prn, last BM 10/8, on Senna S 2 BID, add miralax 11.  Urinary retention- voiding on flomax and urecholine             12. Poss UTI UA neg and cx + for enterococcus, on Amoxil, 5 day course to complete LOS (Days) 8 A FACE TO FACE EVALUATION WAS PERFORMED  KIRSTEINS,ANDREW E 04/18/2015, 7:03 AM

## 2015-04-18 NOTE — Progress Notes (Signed)
Physical Therapy Session Note  Patient Details  Name: Darlene Murray MRN: [id number] Date of Birth: 1922-01-24  Today's Date: 04/18/2015 PT Individual Time:0800-0900, 0233-4356 PT Individual Time Calculation (min): 60, 30 min   Short Term Goals: Week 2:  PT Short Term Goal 1 (Week 2): = LTG due to estimated LOS  Skilled Therapeutic Interventions/Progress Updates:   Tx 1:  Son [name] observed.  Gait in room to toilet and sink with close supervision, cues for upright posture and forward gaze.  Toilet transfer for continent BM and voiding.  Pt fatigued after BM.    Gait to/from gym x 125', x150' with close supervision.  Pt's hips tend to drift L during gait.   [place] A exs for Fall Prevention using hand out, 2# on ankles for long arc quad knee ext, 0# for hmstring curls, calf raises, toe raises, hip abd, mini squats; 8 x 1 each.  Pt required frequent seated rest breaks.  Pt had great difficulty with standing hip abd, bilaterally.  Pt and son educated on falls risk, indication for continuing [place] after d/c.  Pt left sitting in recliner with all needs within reach.    tx 2:  Son [name] observed.  Gait on level tile and carpet, and transporting clothes on hangars on RW, with supervison.  Dyanmic standing balance standing sideways with RW, using 1 hand to pull and push drawers of armoire, contralateral hand on middle of RW bar.  neuromuscular re-education via forced use for L and R sidestepping, and L and R hip abduction to nudge Yoga block laterally.   Gait to return to room with RW, kicking Yoga block with R foot.    Pt reported L great toe tender; erythema noted lateral border, but pt states pain in on medial border.  Son reported pt is awaiting MD to evaluate toe pain.  Pt left sitting in recliner with all needs within reach.    Therapy Documentation Precautions:  Precautions Precautions: Fall Restrictions Weight Bearing Restrictions: No   Pain: none at rest; L great toe  tender     See Function Navigator for Current Functional Status.   Therapy/Group: Individual Therapy  Mourad Cwikla 04/18/2015, 5:25 PM

## 2015-04-18 NOTE — Progress Notes (Signed)
Social Work [name], LCSW Social Worker Signed  Patient Care Conference 04/18/2015  1:19 PM    Expand All Collapse All   Inpatient RehabilitationTeam Conference and Plan of Care Update Date: 04/18/2015   Time: 10; 45 AM     Patient Name: Darlene Murray       Medical Record Number: [id number]  Date of Birth: Jun 30, 1922 Sex: Female         Room/Bed: 4M03C/4M03C-01 Payor Info: Payor: MEDICARE / Plan: MEDICARE PART A AND B / Product Type: *No Product type* /    Admitting Diagnosis: b Cvas    Admit Date/Time:  04/10/2015  2:13 PM Admission Comments: No comment available   Primary Diagnosis:  Ataxia Principal Problem: Ataxia    Patient Active Problem List     Diagnosis  Date Noted   .  Acute hemorrhagic infarction of brain ([place])  04/10/2015   .  Ataxia     .  Cerebrovascular accident (CVA) due to embolism ([place])  04/07/2015   .  Weakness  04/06/2015   .  Generalized weakness  04/06/2015   .  Nausea with vomiting  04/06/2015   .  Diastolic dysfunction  78/29/5621   .  Severe persistent asthma in adult steroid dependent     .  Allergic rhinitis     .  Hyperlipidemia     .  Venous insufficiency     .  HNP (herniated nucleus pulposus), lumbar     .  Left ventricular hypertrophy       Expected Discharge Date: Expected Discharge Date: 04/20/15  Team Members Present: Physician leading conference: Dr. Alysia Penna Social Worker Present: Ovidio Kin, LCSW Nurse Present: Heather Roberts, RN PT Present: Georjean Mode, PT OT Present: Clyda Greener, OT PPS Coordinator present : Daiva Nakayama, RN, CRRN        Current Status/Progress  Goal  Weekly Team Focus   Medical     Constipation, some toe pain reported by OT, now voiding   home with family  D/C planning   Bowel/Bladder     Pt. is voidding,Still doing PVR's.LBM 10/9   Pt. will manage bladder and bowel with min. assisst.   Continuing timed toileting Q 2 hrs and PRN.   Swallow/Nutrition/ Hydration       na         ADL's     supervision for UB and LB selfcare sit to stand, supervision for toilet transfers and shower transfers with use of the RW.  RUE weakness, more at the shoulder than elbow and hand.  Slight ataxia noted with UE use as well.   supervision overall  selfcare retraining, transfer training, neuromuscular re-education therapeutic activities, therapeutic exercise, pt/family education    Mobility     supervision/CGA transfers, gait and stairs with RW  mod I bed mobility, supervision transfers, gait home/community, stairs  activity tolerance, dynamic standing balance, LE strength    Communication       na         Safety/Cognition/ Behavioral Observations    Pt. following the safety plan and call for assisstance.  Pt. will be free from falls and injury with min. assisst.   Encourage use of safety precautions.    Pain     No complains of pain.  Pain levels less than 3,on scale 1 to 10.  Assess for pain levels Q 2-3 hrs. and PRN   Skin     Bilateral edema to lower extremities.   Pt. will  be free from breakdown and infections.         *See Care Plan and progress notes for long and short-term goals.    Barriers to Discharge:  poor balance, fall risk Berg 38, equipment issues      Possible Resolutions to Barriers:   D/C 10/14     Discharge Planning/Teaching Needs:   Home with husband and son to assist when husband not there, both have been here to participate in therapies with pt        Team Discussion:    Meeting goals of supervision level. Berg 31/56. Voiding now.  Big toe bothering her-ingrown toenail. Constipated but had bowel movement today. Arthritis in back acting up. Work with husband on equipment   Revisions to Treatment Plan:    None    Continued Need for Acute Rehabilitation Level of Care: The patient requires daily medical management by a physician with specialized training in physical medicine and rehabilitation for the following conditions: Daily direction of a multidisciplinary  physical rehabilitation program to ensure safe treatment while eliciting the highest outcome that is of practical value to the patient.: Yes Daily medical management of patient stability for increased activity during participation in an intensive rehabilitation regime.: Yes Daily analysis of laboratory values and/or radiology reports with any subsequent need for medication adjustment of medical intervention for : Neurological problems  Leeon Makar, [name] 04/18/2015, 1:19 PM                 [name], [place] Social Worker Signed  Patient Care Conference 04/11/2015  2:01 PM    Expand All Collapse All   Inpatient RehabilitationTeam Conference and Plan of Care Update Date: 04/11/2015   Time: 110;55 AM     Patient Name: Darlene Murray       Medical Record Number: [id number]  Date of Birth: [birth year] Sex: Female         Room/Bed: 4M03C/4M03C-01 Payor Info: Payor: MEDICARE / Plan: MEDICARE PART A AND B / Product Type: *No Product type* /    Admitting Diagnosis: b Cvas    Admit Date/Time:  04/10/2015  2:13 PM Admission Comments: No comment available   Primary Diagnosis:  Ataxia Principal Problem: Ataxia    Patient Active Problem List     Diagnosis  Date Noted   .  Acute hemorrhagic infarction of brain ([place])  04/10/2015   .  Ataxia     .  Cerebrovascular accident (CVA) due to embolism ([place])  04/07/2015   .  Weakness  04/06/2015   .  Generalized weakness  04/06/2015   .  Nausea with vomiting  04/06/2015   .  Diastolic dysfunction  62/95/2841   .  Severe persistent asthma in adult steroid dependent     .  Allergic rhinitis     .  Hyperlipidemia     .  Venous insufficiency     .  HNP (herniated nucleus pulposus), lumbar     .  Left ventricular hypertrophy       Expected Discharge Date: Expected Discharge Date: 04/20/15  Team Members Present: Physician leading conference: Dr. Alysia Penna Social Worker Present: Ovidio Kin, LCSW Nurse Present: Heather Roberts, RN PT Present:  Georjean Mode, PT OT Present: Clyda Greener, OT SLP Present: Gunnar Fusi, SLP PPS Coordinator present : Daiva Nakayama, RN, CRRN        Current Status/Progress  Goal  Weekly Team Focus   Medical     pt with right sided ataxia,  home with family assist  Iitiate therapy   Bowel/Bladder     Pt requiring I&O caths intermittently. Pt voiding occasionally with therapy. Pt continent of bowel. LBM 04/11/15   Pt will manage bowel and bladder with min assist.   Attempt timed toileting. Offer bathroom frequently.    Swallow/Nutrition/ Hydration       na         ADL's     currently supervision for UB selfcare and min assist for LB selfcare sit to stand.  Mod assist for transfers without assistive device.  slight weakness and ataxia in the RUE, more weakness proximal than distal   supervision overall  selfcare re-training, transfer training, neuromuscular re-education, therapeutic activities, therapeutic exercise, pt/family education    Mobility       eval pending         Communication       na         Safety/Cognition/ Behavioral Observations    Pt following current safety plan and calling approiately.  Pt will remain free of falls and injury with min assist   Encourage use of safety precauitions    Pain     No complaints of pain  Manage pain at 3 or less on a scale of 0-10  Assess Pain Q4 hours and prn and treat accordingly.    Skin     Bilateral edema to lower extremities. CDI.   Prevent skin breakdown and infection   Assess skin Q shift and prn    Rehab Goals Patient on target to meet rehab goals: Yes *See Care Plan and progress notes for long and short-term goals.    Barriers to Discharge:  fear of falling     Possible Resolutions to Barriers:   cont PT/OT     Discharge Planning/Teaching Needs:     Home with husband and son checking daily on. Son is here today to observe in therapies. Pt was very active prior to admission      Team Discussion:    New eval-goals of supervision /  mod/i level.  Ataxia to the right-fear of falling. Visual issues-right eye. I & O caths-will work on bladder issues. Check UA. Balance issues. Complained of pain in l-knee on acute, not now.   Revisions to Treatment Plan:    New eval    Continued Need for Acute Rehabilitation Level of Care: The patient requires daily medical management by a physician with specialized training in physical medicine and rehabilitation for the following conditions: Daily direction of a multidisciplinary physical rehabilitation program to ensure safe treatment while eliciting the highest outcome that is of practical value to the patient.: Yes Daily medical management of patient stability for increased activity during participation in an intensive rehabilitation regime.: Yes Daily analysis of laboratory values and/or radiology reports with any subsequent need for medication adjustment of medical intervention for : Neurological problems;Other  [name] 04/12/2015, 1:35 PM                  Patient ID: Darlene Murray, female   DOB: 06/02/1922, 81 y.o.   MRN: [id number]

## 2015-04-18 NOTE — Progress Notes (Signed)
Occupational Therapy Session Note  Patient Details  Name: Darlene Murray MRN: [id number] Date of Birth: Dec 23, 1921  Today's Date: 04/18/2015 OT Individual Time: 9480-1655 OT Individual Time Calculation (min): 59 min    Short Term Goals: Week 1:  OT Short Term Goal 1 (Week 1): Pt will perform toilet transfers with RW and 3:1 with supervision. OT Short Term Goal 2 (Week 1): Pt will perform shower transfers to walk-in shower with supervision. OT Short Term Goal 3 (Week 1): Pt will perform LB dressing and bathing with supervision sit to stand. OT Short Term Goal 4 (Week 1): Pt will perform RUE shoulder exercises with supervision following handout.    Skilled Therapeutic Interventions/Progress Updates:    Pt performed toileting and toilet transfer with supervision using the RW.  She was able to ambulate to the therapy gym to work on RUE functional use and coordination.  She initially began in supine position working with therapy ball on bilateral shoulder flexion.  Pt able to maintain right elbow extension during shoulder flexion repetitions with the ball.  Transitioned to sitting to work on [place] reach as well.  Had pt reaching to place small pegs using the RUE.  Therapist had to provide min assist for facilitation of full elbow extension with shoulder flexion as pt tends to compensate with forward trunk flexion.  Pt's son present for session.  Educated pt's son on activities to complete to help increase coordination at home.  Returned to room at end of session.     Therapy Documentation Precautions:  Precautions Precautions: Fall Restrictions Weight Bearing Restrictions: No  Pain: Pain Assessment Pain Assessment: Faces Faces Pain Scale: Hurts a little bit Pain Type: Acute pain Pain Location: Foot Pain Orientation: Left Pain Intervention(s): Emotional support ADL: See Function Navigator for Current Functional Status.   Therapy/Group: Individual Therapy  Jamilynn Whitacre  OTR/L 04/18/2015, 10:44 AM

## 2015-04-18 NOTE — Progress Notes (Signed)
Occupational Therapy Session Note  Patient Details  Name: Darlene Murray MRN: [id number] Date of Birth: [birth year]  Today's Date: 04/18/2015 OT Individual Time: 1100-1155 OT Individual Time Calculation (min): 55 min    Short Term Goals: Week 1:  OT Short Term Goal 1 (Week 1): Pt will perform toilet transfers with RW and 3:1 with supervision. OT Short Term Goal 2 (Week 1): Pt will perform shower transfers to walk-in shower with supervision. OT Short Term Goal 3 (Week 1): Pt will perform LB dressing and bathing with supervision sit to stand. OT Short Term Goal 4 (Week 1): Pt will perform RUE shoulder exercises with supervision following handout.    Skilled Therapeutic Interventions/Progress Updates:    Pt seen for OT therapy session focusing on functional mobility/ transfers and neuromuscular re-education. Pt sitting up in recliner upon arrival, agreeable to tx session. She ambulated throughout room and unit using RW with supervision. In ADL apartment, pt completed simulated shower stall transfers with close supervision following demonstration and VCs for technique.  In therapy kitchen, pt stood at [employer] counter to remove/ replace items from overhead kitchen cabinet using R UE. She required stabilizing assist at elbow when extending R elbow when placing items back into cabinet due to weakness and instability.  In therapy gym, pt completed task seated on EOM. Pt required to reach forward and overhead to place horseshoes on overhead basketball rim. Stabilizing at elbow required once pt fatigued. She then completed reaching task, reaching forward with R UE to place horse shoes with emphasis on R UE ROM/ strengthening and core strength/ stability.  Pt returned to room at end of session, left sitting in recliner with all needs in reach.   Therapy Documentation Precautions:  Precautions Precautions: Fall Restrictions Weight Bearing Restrictions: No Pain: Pain Assessment No/denies pain  See  Function Navigator for Current Functional Status.   Therapy/Group: Individual Therapy  Lewis, Tanetta Fuhriman C 04/18/2015, 7:20 AM

## 2015-04-18 NOTE — Patient Care Conference (Signed)
Inpatient RehabilitationTeam Conference and Plan of Care Update Date: 04/18/2015   Time: 10; 45 AM    Patient Name: Darlene Murray      Medical Record Number: [id number]  Date of Birth: February 19, 1922 Sex: Female         Room/Bed: 4M03C/4M03C-01 Payor Info: Payor: MEDICARE / Plan: MEDICARE PART A AND B / Product Type: *No Product type* /    Admitting Diagnosis: b Cvas   Admit Date/Time:  04/10/2015  2:13 PM Admission Comments: No comment available   Primary Diagnosis:  Ataxia Principal Problem: Ataxia  Patient Active Problem List   Diagnosis Date Noted  . Acute hemorrhagic infarction of brain ([place]) 04/10/2015  . Ataxia   . Cerebrovascular accident (CVA) due to embolism ([place]) 04/07/2015  . Weakness 04/06/2015  . Generalized weakness 04/06/2015  . Nausea with vomiting 04/06/2015  . Diastolic dysfunction 73/71/0626  . Severe persistent asthma in adult steroid dependent   . Allergic rhinitis   . Hyperlipidemia   . Venous insufficiency   . HNP (herniated nucleus pulposus), lumbar   . Left ventricular hypertrophy     Expected Discharge Date: Expected Discharge Date: 04/20/15  Team Members Present: Physician leading conference: Dr. Alysia Penna Social Worker Present: Ovidio Kin, LCSW Nurse Present: Heather Roberts, RN PT Present: Georjean Mode, PT OT Present: Clyda Greener, OT PPS Coordinator present : Daiva Nakayama, RN, CRRN     Current Status/Progress Goal Weekly Team Focus  Medical   Constipation, some toe pain reported by OT, now voiding  home with family  D/C planning   Bowel/Bladder   Pt. is voidding,Still doing PVR's.LBM 10/9   Pt. will manage bladder and bowel with min. assisst.   Continuing timed toileting Q 2 hrs and PRN.   Swallow/Nutrition/ Hydration     na        ADL's   supervision for UB and LB selfcare sit to stand, supervision for toilet transfers and shower transfers with use of the RW.  RUE weakness, more at the shoulder than elbow and hand.  Slight ataxia  noted with UE use as well.   supervision overall  selfcare retraining, transfer training, neuromuscular re-education therapeutic activities, therapeutic exercise, pt/family education   Mobility   supervision/CGA transfers, gait and stairs with RW  mod I bed mobility, supervision transfers, gait home/community, stairs  activity tolerance, dynamic standing balance, LE strength   Communication     na        Safety/Cognition/ Behavioral Observations  Pt. following the safety plan and call for assisstance.  Pt. will be free from falls and injury with min. assisst.  Encourage use of safety precautions.   Pain   No complains of pain.  Pain levels less than 3,on scale 1 to 10.  Assess for pain levels Q 2-3 hrs. and PRN   Skin   Bilateral edema to lower extremities.  Pt. will be free from breakdown and infections.         *See Care Plan and progress notes for long and short-term goals.  Barriers to Discharge: poor balance, fall risk Berg 38, equipment issues    Possible Resolutions to Barriers:  D/C 10/14    Discharge Planning/Teaching Needs:  Home with husband and son to assist when husband not there, both have been here to participate in therapies with pt      Team Discussion:  Meeting goals of supervision level. Berg 31/56. Voiding now.  Big toe bothering her-ingrown toenail. Constipated but had bowel movement today. Arthritis  in back acting up. Work with husband on equipment  Revisions to Treatment Plan:  None   Continued Need for Acute Rehabilitation Level of Care: The patient requires daily medical management by a physician with specialized training in physical medicine and rehabilitation for the following conditions: Daily direction of a multidisciplinary physical rehabilitation program to ensure safe treatment while eliciting the highest outcome that is of practical value to the patient.: Yes Daily medical management of patient stability for increased activity during participation  in an intensive rehabilitation regime.: Yes Daily analysis of laboratory values and/or radiology reports with any subsequent need for medication adjustment of medical intervention for : Neurological problems  Darlene Murray, [name] 04/18/2015, 1:19 PM

## 2015-04-19 ENCOUNTER — Inpatient Hospital Stay (HOSPITAL_COMMUNITY): Payer: Medicare Other | Admitting: Physical Therapy

## 2015-04-19 ENCOUNTER — Inpatient Hospital Stay (HOSPITAL_COMMUNITY): Payer: Medicare Other | Admitting: Occupational Therapy

## 2015-04-19 ENCOUNTER — Inpatient Hospital Stay (HOSPITAL_COMMUNITY): Payer: Medicare Other

## 2015-04-19 NOTE — Progress Notes (Signed)
Physical Therapy Discharge Summary  Patient Details  Name: Darlene Murray MRN: [id number] Date of Birth: 08-19-1921  Today's Date: 04/19/2015 PT Individual Time:1005-1120; 1335-1435 75 min, 60 min 1335-1435 PT Individual Time Calculation (min): 60 min   Patient has met 12 of 12 long term goals due to improved activity tolerance, improved balance, improved postural control, increased strength, ability to compensate for deficits, functional use of  right upper extremity and right lower extremity, improved awareness and improved coordination.  Patient to discharge at an ambulatory level Supervision.   Patient's care partner is independent to provide the necessary physical and cognitive assistance at discharge.  Reasons goals not met: n/a  Recommendation:  Patient will benefit from ongoing skilled PT services in home health setting to continue to advance safe functional mobility, address ongoing impairments in balance, coordination, activity tolerance, awareness, and minimize fall risk.  Equipment: RW  Reasons for discharge: treatment goals met and discharge from hospital  Patient/family agrees with progress made and goals achieved: Yes   tx 1 today : gait to/from room, up/down 12 corner stairs.  Patient demonstrates increased fall risk as noted by score of  24/56 on Berg Balance Scale.  (<36= high risk for falls, close to 100%; 37-45 significant >80%; 46-51 moderate >50%; 52-55 lower >25%); this is a slight decrease from last testing.  Pt demonstrates significant shakiness and RLE ataxia today.   5 xSTS; TUG.  Falls risk discussed with pt and son [name], with recommendation that pt continue to use RW until follow- up PT possibly discontinues.  Multiple seated rests required throughout session.   neuromuscular re-education via forced use, demo, manual cues for trunk shortening/lengthening/rotating in unsupported sitting while reaching out of BOS L and R.  Family ed with son [name] for gait  over level terrain and basic transfers in controlled env and home env. Pt left resting in recliner with all needs within reach, son present.  Tx 2 today:   Gait in room to/from toilet and sink with RW.  Gait to/from gym over level terrain, over uneven terrain via floor mat; in busy hallway, retrieving small items from floor, simulated car transfer, bed mobility.   Neuromuscular re-education via demo, manual cues for [place] A exs : 10 x 1 each mini squats, R/L standing hip abd, toe raises, hamstring curls, all without weights; sitting calf raises, and long arc quad knee ext with 2# bil.  Pt unable to perform calf raises in standing due to tender L great toe. (RN is aware)   Pt passed directly to next therapist for further tx in gym.  Pt's husband needs training for simulated car transfer, gait over level terrain and brief discussion of [place], using hand out.  PT Discharge Precautions/Restrictions Pain Pain Assessment Pain Assessment: No/denies pain Pain Score: 0-No pain Vision/Perception wears glasses at all times; see OT eval for vision details    Cognition Orientation Level: Oriented X4 Sensation Sensation Light Touch: Appears Intact Proprioception: Appears Intact (R knee and ankle) Coordination Gross Motor Movements are Fluid and Coordinated: No Fine Motor Movements are Fluid and Coordinated: No Heel Shin Test: NT Motor  Motor Motor: Hemiplegia;Ataxia;Abnormal postural alignment and control Motor - Discharge Observations: Mild ataxia RLE, possibly truncal as well. Pelvis trends laterally to L in standing.  Pt no longer has RUE ataxia, uses R arm purposely and actively with all ADLs.  Mobility Bed Mobility Bed Mobility: Rolling Right;Rolling Left;Supine to Sit;Sit to Supine Rolling Right: 6: Modified independent (Device/Increase time) Rolling Left:  6: Modified independent (Device/Increase time) Right Sidelying to Sit: 6: Modified independent (Device/Increase time) Left  Sidelying to Sit: 6: Modified independent (Device/Increase time) Supine to Sit: 6: Modified independent (Device/Increase time) Sit to Supine: 6: Modified independent (Device/Increase time) Transfers Transfers: Yes Sit to Stand: 5: Supervision;With armrests Sit to Stand Details: Verbal cues for safe use of DME/AE Stand to Sit: 5: Supervision Stand to Sit Details (indicate cue type and reason): Verbal cues for safe use of DME/AE Stand Pivot Transfers: 5: Supervision Stand Pivot Transfer Details: Verbal cues for safe use of DME/AE Locomotion  Ambulation Ambulation: Yes Ambulation/Gait Assistance: 5: Supervision Ambulation Distance (Feet): 150 Feet Assistive device: Rolling walker Ambulation/Gait Assistance Details: Verbal cues for technique (upright trunk, forward gaze) Gait Gait: Yes Gait Pattern: Impaired Gait Pattern: Step-through pattern;Trunk rotated posteriorly on right;Trunk flexed;Decreased stride length;Ataxic;Narrow base of support (pelvis shifted to L) Gait velocity: 2.05'/sec High Level Ambulation High Level Ambulation: Backwards walking Backwards Walking: supervision with rW Stairs / Additional Locomotion Stairs: Yes Stairs Assistance: 5: Supervision Stairs Assistance Details: Tactile cues for sequencing Stair Management Technique: Two rails Number of Stairs: 12 Height of Stairs: 6 ((4) 6' high;  (8) 3") Ramp: 5: Supervision Curb: 4: Min Chemical engineer: Yes Wheelchair Assistance: 5: Careers information officer: Both upper extremities Wheelchair Parts Management: Needs assistance Distance: 150  Trunk/Postural Assessment  Cervical Assessment Cervical Assessment: Within Functional Limits (forward head) Thoracic Assessment Thoracic Assessment: Within Functional Limits (flexion in standing) Lumbar Assessment Lumbar Assessment: Exceptions to WFL (L trunk shortening in sitting; L lateral pelvic shift in standing) Postural  Control Postural Control: Deficits on evaluation Protective Responses: delayed and insufficient during external perturbations, but bil ankle strategy demonstrated with posterior LOB  Balance Dynamic Sitting Balance Dynamic Sitting - Level of Assistance: 6: Modified independent (Device/Increase time) Static Standing Balance Static Standing - Level of Assistance: 5: Stand by assistance Dynamic Standing Balance Dynamic Standing - Balance Support: Left upper extremity supported Dynamic Standing - Level of Assistance: 5: Stand by assistance Extremity Assessment      RLE Assessment RLE Assessment: Within Functional Limits (gross strength) LLE Assessment LLE Assessment: Within Functional Limits (gross strength)   See Function Navigator for Current Functional Status.  COOK,CAROLINE 04/19/2015, 5:13 PM

## 2015-04-19 NOTE — Discharge Summary (Signed)
Discharge summary job [id number]

## 2015-04-19 NOTE — Progress Notes (Signed)
Social Work  Discharge Note  The overall goal for the admission was met for:   Discharge location: [place] TO ASSIST ALSO  Length of Stay: Yes-10 DAYS  Discharge activity level: Yes-SUPERVISION LEVEL  Home/community participation: Yes  Services provided included: MD, RD, PT, OT, SLP, RN, CM, TR, Pharmacy and SW  Financial Services: Medicare and Private Insurance: [facility]  Follow-up services arranged: Home Health: [facility]-PT,OT, DME: [facility] and Patient/Family has no preference for HH/DME agencies *Pt/family declined tub seat due to borrowing one.  Comments (or additional information):FAMILY HERE DAILY TO ATTEND THERAPIES WITH PT AND FEEL COMFORTABLE WITH HER CARE NEEDS.  Patient/Family verbalized understanding of follow-up arrangements: Yes  Individual responsible for coordination of the follow-up plan: JOHN-HUSBAND & [name]-SON  Confirmed correct DME delivered: [name] 04/19/2015    [name]

## 2015-04-19 NOTE — Discharge Instructions (Signed)
Inpatient Rehab Discharge Instructions  Darlene Murray Discharge date and time: No discharge date for patient encounter.   Activities/Precautions/ Functional Status: Activity: activity as tolerated Diet: regular diet Wound Care: none needed Functional status:  ___ No restrictions     ___ Walk up steps independently ___ 24/7 supervision/assistance   ___ Walk up steps with assistance ___ Intermittent supervision/assistance  ___ Bathe/dress independently ___ Walk with walker     ___ Bathe/dress with assistance ___ Walk Independently    ___ Shower independently _x STROKE/TIA DISCHARGE INSTRUCTIONS SMOKING Cigarette smoking nearly doubles your risk of having a stroke & is the single most alterable risk factor  If you smoke or have smoked in the last 12 months, you are advised to quit smoking for your health.  Most of the excess cardiovascular risk related to smoking disappears within a year of stopping.  Ask you doctor about anti-smoking medications  [place] Quit Line: 1-800-QUIT NOW  Free Smoking Cessation Classes [phone]  CHOLESTEROL Know your levels; limit fat & cholesterol in your diet  Lipid Panel     Component Value Date/Time   CHOL 161 04/07/2015 0622   TRIG 47 04/07/2015 0622   HDL 48 04/07/2015 0622   CHOLHDL 3.4 04/07/2015 0622   VLDL 9 04/07/2015 0622   LDLCALC 104* 04/07/2015 0622      Many patients benefit from treatment even if their cholesterol is at goal.  Goal: Total Cholesterol (CHOL) less than 160  Goal:  Triglycerides (TRIG) less than 150  Goal:  HDL greater than 40  Goal:  LDL (LDLCALC) less than 100   BLOOD PRESSURE American Stroke Association blood pressure target is less that 120/80 mm/Hg  Your discharge blood pressure is:  BP: 112/68 mmHg  Monitor your blood pressure  Limit your salt and alcohol intake  Many individuals will require more than one medication for high blood pressure  DIABETES (A1c is a blood sugar average for last 3 months)  Goal HGBA1c is under 7% (HBGA1c is blood sugar average for last 3 months)  Diabetes: No known diagnosis of diabetes    Lab Results  Component Value Date   HGBA1C 6.0* 04/07/2015     Your HGBA1c can be lowered with medications, healthy diet, and exercise.  Check your blood sugar as directed by your physician  Call your physician if you experience unexplained or low blood sugars.  PHYSICAL ACTIVITY/REHABILITATION Goal is 30 minutes at least 4 days per week  Activity: Increase activity slowly, Therapies: Physical Therapy: Home Health Return to work:   Activity decreases your risk of heart attack and stroke and makes your heart stronger.  It helps control your weight and blood pressure; helps you relax and can improve your mood.  Participate in a regular exercise program.  Talk with your doctor about the best form of exercise for you (dancing, walking, swimming, cycling).  DIET/WEIGHT Goal is to maintain a healthy weight  Your discharge diet is: Diet regular Room service appropriate?: Yes; Fluid consistency:: Thin  liquids Your height is:    Your current weight is: Weight: 75.388 kg (166 lb 3.2 oz) Your Body Mass Index (BMI) is:     Following the type of diet specifically designed for you will help prevent another stroke.  Your goal weight range is:    Your goal Body Mass Index (BMI) is 19-24.  Healthy food habits can help reduce 3 risk factors for stroke:  High cholesterol, hypertension, and excess weight.  RESOURCES Stroke/Support Group:  Call [phone]  STROKE EDUCATION PROVIDED/REVIEWED AND GIVEN TO PATIENT Stroke warning signs and symptoms How to activate emergency medical system (call 911). Medications prescribed at discharge. Need for follow-up after discharge. Personal risk factors for stroke. Pneumonia vaccine given:  Flu vaccine given:  My questions have been answered, the writing is legible, and I understand these instructions.  I will adhere to these goals &  educational materials that have been provided to me after my discharge from the hospital.    __ Walk with assistance    ___ Shower with assistance ___ No alcohol     ___ Return to work/school ________  Special Instructions:    COMMUNITY REFERRALS UPON DISCHARGE:    Home Health:   [facility]   Date of last service:04/20/2015  Medical Equipment/Items Ordered:ROLLING WALKER, [place]   [phone]  GENERAL COMMUNITY RESOURCES FOR PATIENT/FAMILY: Support Groups:[facility] SUPPORT GROUP  My questions have been answered and I understand these instructions. I will adhere to these goals and the provided educational materials after my discharge from the hospital.  Patient/Caregiver Signature _______________________________ Date __________  Clinician Signature _______________________________________ Date __________  Please bring this form and your medication list with you to all your follow-up doctor's appointments.

## 2015-04-19 NOTE — Progress Notes (Signed)
Physical Therapy Session Note  Patient Details  Name: Darlene Murray MRN: [id number] Date of Birth: 1922-05-28  Today's Date: 04/19/2015 PT Individual Time: 1430-1500 PT Individual Time Calculation (min): 30 min   Short Term Goals: Week 2:  PT Short Term Goal 1 (Week 2): = LTG due to estimated LOS  Skilled Therapeutic Interventions/Progress Updates:    Pt received seated on edge of mat table with handoff from PT from previous session; no c/o pain and agreeable to treatment. Session focused on higher level balance on compliant surfaces including balance foam and foam wedge. Performed progressive static standing balance challenges including foam surface with no UE support, no UE support and eyes closed, and narrow BOS. Initial trials of all conditions only able to maintain balance 1-2 seconds before requiring use of UEs on parallel bars, however improved to 15-20 seconds in each condition without support or assistance. On foam wedge, performed dynamic UE reaching with trunk rotation; required occasional use of UEs on bars and minA to maintain balance. Gait to return to room x100' with RW and supervision. Equipment had arrived to pt's room; assessed fit of new RW and educated pt on ability to adjust height if necessary to improve upright posture. Pt remained seated in recliner with all needs in reach at completion of session.  Therapy Documentation Precautions:  Precautions Precautions: Fall Restrictions Weight Bearing Restrictions: No Pain: Pain Assessment Pain Assessment: No/denies pain Pain Score: 0-No pain   See Function Navigator for Current Functional Status.   Therapy/Group: Individual Therapy  Luberta Mutter 04/19/2015, 2:58 PM

## 2015-04-19 NOTE — Progress Notes (Signed)
Subjective/Complaints:  Now voiding    ROS-  + constip, neg for bladder issues , no joint pains, no SOB   Objective: Vital Signs: Blood pressure 116/54, pulse 77, temperature 98 F (36.7 C), temperature source Oral, resp. rate 18, weight 75.388 kg (166 lb 3.2 oz), SpO2 100 %. No results found. No results found for this or any previous visit (from the past 72 hour(s)).    General: No acute distress Mood and affect are appropriate Heart: Regular rate and rhythm no rubs murmurs or extra sounds Lungs: Clear to auscultation, breathing unlabored, no rales or wheezes Abdomen: Positive bowel sounds, soft nontender to palpation, nondistended Extremities: No clubbing, cyanosis, or edema Skin: No evidence of breakdown, no evidence of rash Neurologic: Cranial nerves II through XII intact, motor strength is 5/5 in bilateral deltoid, bicep, tricep, grip, hip flexor, knee extensors, ankle dorsiflexor and plantar flexor Sensory exam normal sensation to light touch and proprioception in bilateral upper and lower extremities Cerebellar exam normal finger to nose to finger on left , mild dysmetria on right, some left visual neglect on confrontation but not cosistent, no evidence of nystamus Musculoskeletal: Full range of motion in all 4 extremities. No joint swelling   Assessment/Plan: 1. 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 Physiatrist is providing close team supervision and 24 hour management of active medical problems listed below. Physiatrist and rehab team continue to assess barriers to discharge/monitor patient progress toward functional and medical goals. Plan D/C in am .FIM: Function - Bathing Position: Shower Body parts bathed by patient: Right arm, Left arm, Chest, Abdomen, Front perineal area,  Buttocks, Right upper leg, Left upper leg, Back, Right lower leg, Left lower leg Body parts bathed by helper: Left lower leg, Right lower leg Assist Level: Touching or steadying assistance(Pt > 75%)  Function- Upper Body Dressing/Undressing What is the patient wearing?: Pull over shirt/dress, Bra, Button up shirt Bra - Perfomed by patient: Thread/unthread right bra strap, Thread/unthread left bra strap, Hook/unhook bra (pull down sports bra) Pull over shirt/dress - Perfomed by patient: Thread/unthread right sleeve, Thread/unthread left sleeve, Put head through opening, Pull shirt over trunk Button up shirt - Perfomed by patient: Thread/unthread right sleeve, Thread/unthread left sleeve, Pull shirt around back, Button/unbutton shirt Assist Level: Set up, Supervision or verbal cues Set up : To obtain clothing/put away Function - Lower Body Dressing/Undressing What is the patient wearing?: Pants, Socks, Non-skid slipper socks Position: Sitting EOB Pants- Performed by patient: Thread/unthread right pants leg, Thread/unthread left pants leg, Pull pants up/down Pants- Performed by helper: Fasten/unfasten pants Non-skid slipper socks- Performed by patient: Don/doff right sock, Don/doff left sock Assist for footwear: Setup Assist for lower body dressing: Supervision or verbal cues  Function - Toileting Toileting activity did not occur: No continent bowel/bladder event Toileting steps completed by patient: Adjust clothing prior to toileting, Performs perineal hygiene, Adjust clothing after toileting Toileting steps completed by helper: Performs perineal hygiene, Adjust clothing after toileting, Adjust clothing prior to toileting [place]: Grab bar or rail Assist level: More than reasonable time  Function - Air cabin crew transfer assistive device: Grab bar, Walker Assist level to toilet: Supervision or verbal cues Assist level from toilet: Supervision or verbal  cues  Function - Chair/bed transfer Chair/bed transfer method: Ambulatory Chair/bed transfer assist level: Supervision or verbal cues Chair/bed transfer assistive device:  Walker, Armrests Chair/bed transfer details: Verbal cues for precautions/safety, Verbal cues for technique  Function - Locomotion: Wheelchair Will patient use wheelchair at discharge?: No Type: Manual Max wheelchair distance: 75 Assist Level: Touching or steadying assistance (Pt > 75%) Wheel 50 feet with 2 turns activity did not occur:  (will be an ambulator at d/c) Assist Level: Touching or steadying assistance (Pt > 75%) Turns around,maneuvers to table,bed, and toilet,negotiates 3% grade,maneuvers on rugs and over doorsills: No Function - Locomotion: Ambulation Assistive device: Walker-rolling Max distance: 190 Assist level: Supervision or verbal cues Assist level: Supervision or verbal cues Walk 50 feet with 2 turns activity did not occur: Safety/medical concerns (weakness and fear of falling) Assist level: Supervision or verbal cues Assist level: Supervision or verbal cues Walk 10 feet on uneven surfaces activity did not occur: Safety/medical concerns (weakness and fear of falling) Assist level: Touching or steadying assistance (Pt > 75%)  Function - Comprehension Comprehension: Auditory Comprehension assist level: Follows basic conversation/direction with no assist  Function - Expression Expression: Verbal Expression assist level: Expresses basic 90% of the time/requires cueing < 10% of the time.  Function - Social Interaction Social Interaction assist level: Interacts appropriately 90% of the time - Needs monitoring or encouragement for participation or interaction.  Function - Problem Solving Problem solving assist level: Solves basic 90% of the time/requires cueing < 10% of the time  Function - Memory Memory assist level: Recognizes or recalls 75 - 89% of the time/requires cueing 10 - 24% of the  time Patient normally able to recall (first 3 days only): Current season, That he or she is in a hospital, Staff names and faces Medical Problem List and Plan: 1. 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  DVT Prophylaxis/Anticoagulation: SCDs. Monitor for any signs of DVT 3. Pain Management: Tylenol as needed 4. Steroid-dependent asthma. Continue prednisone as well as Dulera, no wheezing on exam 5. Neuropsych: This patient is capable of making decisions on her own behalf. 6. Skin/Wound Care: Routine skin checks, 7. Fluids/Electrolytes/Nutrition: Routine I&O with follow-up bmet normal,taking 80-100%meals 8. Hyperlipidemia. Pravachol 9.  Hypoalbuminemia will add beneprotein 10.  Constipation adjust laxatives, add dulc supp prn, last BM 10/8, on Senna S 3 BID, + miralax, monitor for excessive stools 11.  Urinary retention- voiding on flomax and urecholine, stop flomax tonite and monitor             LOS (Days) 9 A FACE TO FACE EVALUATION WAS PERFORMED  Delynn Olvera E 04/19/2015, 7:21 AM

## 2015-04-19 NOTE — Progress Notes (Signed)
Occupational Therapy Discharge Summary  Patient Details  Name: Darlene Murray MRN: [id number] Date of Birth: 24-Mar-1922   Patient has met 12 of 12 long term goals due to improved activity tolerance, improved balance, postural control, ability to compensate for deficits, functional use of  RIGHT upper extremity and improved coordination.  Patient to discharge at overall Supervision level.  Patient's care partner is independent to provide the necessary physical assistance at discharge.   Pt has made progress with her RUE coordination, AROM and strength in shoulder, but continues to have weakness with 3+/5 in shoulder.  No ataxia present during functional activities, improved postural control and balance to a S level in standing.  Reasons goals not met: n/a  Recommendation:  Patient will benefit from ongoing skilled OT services in home health setting to continue to advance functional skills in the area of iADL.  Equipment: shower seat  Reasons for discharge: treatment goals met  Patient/family agrees with progress made and goals achieved: Yes  OT Discharge Precautions/Restrictions  Precautions Precautions: Fall  Pain Pain Assessment Pain Assessment: No/denies pain ADL  supervision with BADLs and light cooking/ home management Vision/Perception  Vision- History Baseline Vision/History: Wears glasses Wears Glasses: At all times Patient Visual Report: No change from baseline Vision- Assessment Eye Alignment: Impaired (comment) Ocular Range of Motion: Within Functional Limits Tracking/Visual Pursuits: Decreased smoothness of vertical tracking Saccades: Additional eye shifts occurred during testing;Additional head turns occurred during testing Convergence: Impaired (comment) Visual Fields: No apparent deficits Perception Comments: WFL  Cognition Overall Cognitive Status: Within Functional Limits for tasks assessed Sensation Sensation Light Touch: Appears Intact Stereognosis:  Appears Intact Hot/Cold: Appears Intact Proprioception: Appears Intact Coordination Gross Motor Movements are Fluid and Coordinated: Yes Fine Motor Movements are Fluid and Coordinated: Yes Finger Nose Finger Test: No ataxia noted in finger to nose test, slightly decreased speed due to R shoulder weakness Motor  Motor Motor - Discharge Observations: Pt no longer has RUE ataxia, uses R arm purposely and actively with all ADLs. Mobility    supervision with transfers with RW Trunk/Postural Assessment    slight kyphosis, improved postural control to a supervision level in standing Balance Dynamic Sitting Balance Dynamic Sitting - Level of Assistance: 6: Modified independent (Device/Increase time) Static Standing Balance Static Standing - Level of Assistance: 5: Stand by assistance Dynamic Standing Balance Dynamic Standing - Balance Support: Left upper extremity supported Dynamic Standing - Level of Assistance: 5: Stand by assistance Extremity/Trunk Assessment RUE Strength RUE Overall Strength Comments: shoulder AROM for flexion 0-160 degrees, strength 3+/5, elbow AROM WFLS with strength 4/5, grip strength 4/5 with pt being able to oppose thumb to all digits. LUE Assessment LUE Assessment: Within Functional Limits   See Function Navigator for Current Functional Status.  [place] 04/19/2015, 10:37 AM

## 2015-04-19 NOTE — Progress Notes (Signed)
Occupational Therapy Session Note  Patient Details  Name: Darlene Murray MRN: [id number] Date of Birth: Nov 01, 1921  Today's Date: 04/19/2015 OT Individual Time: 7616-0737 OT Individual Time Calculation (min): 45 min    Short Term Goals: Week 1:  OT Short Term Goal 1 (Week 1): Pt will perform toilet transfers with RW and 3:1 with supervision. OT Short Term Goal 2 (Week 1): Pt will perform shower transfers to walk-in shower with supervision. OT Short Term Goal 3 (Week 1): Pt will perform LB dressing and bathing with supervision sit to stand. OT Short Term Goal 4 (Week 1): Pt will perform RUE shoulder exercises with supervision following handout.       Skilled Therapeutic Interventions/Progress Updates:    Pt seen this session for ADL retraining and family education with pt's son. Pt agreeable to shower and dressing. Pt was able to use RW to enter bathroom and complete toilet and shower transfers with supervision. Once in shower, distant S.  No cuing needed for safety. Dressed in bathroom without cuing.  Pt's son discussed his father's plan to remove shower door and replace with a curtain.  They will also obtain a shower seat. ADLs completed and discharge eval completed. Reviewed plan for [facility] and to address with that therapist the weakness in her R shoulder. Pt resting in recliner at end of session with all needs met.  Therapy Documentation Precautions:  Precautions Precautions: Fall Restrictions Weight Bearing Restrictions: No  Pain: Pain Assessment Pain Assessment: No/denies pain ADL:  See Function Navigator for Current Functional Status.   Therapy/Group: Individual Therapy  [place] 04/19/2015, 10:02 AM

## 2015-04-19 NOTE — Discharge Summary (Signed)
NAME:  Darlene Murray, Darlene Murray NO.:  [id number]  MEDICAL RECORD NO.:  [id number]  LOCATION:  [id number]                        FACILITY:  [facility]  PHYSICIAN:  Charlett Blake, M.D.DATE OF BIRTH:  1921/11/15  DATE OF ADMISSION:  04/10/2015 DATE OF DISCHARGE:  04/20/2015                              DISCHARGE SUMMARY   DISCHARGE DIAGNOSES: 1. Functional deficits secondary to bilateral hemorrhagic infarct,     suspect embolic right pontocerebellar and left occipital. 2. SCDs for DVT prophylaxis. 3. Steroid dependent asthma. 4. Hyperlipidemia. 5. Decreased nutritional storage. 6. Constipation. 7. Urinary retention, resolving.  HISTORY OF PRESENT ILLNESS:  This is a 81 year old right-hand female with steroid-dependent asthma who lives with spouse independent prior to admission.  Presented April 06, 2015, with nausea, vomiting, dizziness, decrease in bowel, generalized weakness, and mild slurred speech.  MRI revealed acute moderate-sized nonhemorrhagic infarct posterior right upper and mid pons and adjacent right cerebellum acute tiny nonhemorrhagic infarct, left thalamic infarct, acute nonhemorrhagic left occipital lobe infarct.  CT of the head and neck with no significant stenosis or occlusion.  Echocardiogram was pending.  The patient did not receive tPA.  Neurology consulted, placed on aspirin therapy.  Low-dose prednisone for asthma.  The patient was admitted for comprehensive rehab program.  PAST MEDICAL HISTORY:  See discharge diagnoses.  SOCIAL HISTORY:  Lives with spouse, independent prior to admission. Functional status upon admission to rehab services with moderate assist to ambulate 10 feet rolling walker, moderate assist stand pivot transfers minutes, min to mod assist for activities of daily living.  PHYSICAL EXAMINATION:  VITAL SIGNS:  Blood pressure 132/69, pulse 73, temperature 97.8, respirations 20.  GENERAL:  This was an alert female, oriented  x3.  LUNGS:  Decreased breath sounds.  Clear to auscultation.  CARDIAC:  Regular rate and rhythm without murmur.  ABDOMEN:  Soft, nontender.  Good bowel sounds.  NEUROLOGIC:  She is oriented to person, place, time, and follows commands, fair awareness of deficits.  REHABILITATION HOSPITAL COURSE:  The patient was admitted to Inpatient Rehab Services with therapies initiated on a 3-hour daily basis consisting of physical therapy, occupational therapy, and rehabilitation nursing.  The following issues were addressed during the patient's rehabilitation stay.  Pertaining to Ms. Fear right pontocerebellar and left occipital infarct remained stable, maintained on aspirin therapy. She would follow up with Neurology Services.  She remained on low-dose prednisone therapy for history of steroid dependent asthma.  No shortness of breath.  Blood pressures remained well controlled.  She had occasional bouts of constipation resolved with laxative assistance. Initial bouts of urinary retention, placed on Flomax as well as Urecholine, voiding greatly improved and Flomax was discontinued and taper of urecholine.  She did complete a 5-day course of amoxicillin for an enterococcus urinary tract infection.  The patient received weekly collaborative interdisciplinary team conferences to discuss estimated length of stay, family teaching, any barriers to her discharge.  She was ambulating to and from the therapy gym with close supervision, a 150 feet with assistive device.  She did require occasional seated rest breaks.  Had some difficulty in standing with hip abduction bilaterally.  Worked on ambulation on tile carpet as well  as transporting close on hangers with a rolling walker and required supervision.  Dynamic standing balance, sideways with a rolling walker. She was able to gather her belongings for activities of daily living and homemaking, ambulated to the gym again for her necessary  therapies. Full family teaching was completed.  Plan discharge to home with ongoing therapies dictated per [facility].  DISCHARGE MEDICATIONS: 1. Aspirin 325 mg p.o. daily. 2. Os-Cal 1250 mg daily. 3. Urecholine taper as directed. 4. Dulera 100-5 mcg 2 puffs twice daily. 5. MiraLax daily, hold for loose stool. 6. Pravachol 20 mg p.o. daily. 7. Prednisone 5 mg p.o. daily. 8. Senokot-S 2 tablets twice daily, hold for loose stools.   DIET:  Regular.  FOLLOWUP PLAN:  She would follow with Dr. Alysia Penna at the outpatient rehab center as directed; Dr. Leonie Man in 1 month, call for appointment; Dr. Hulan Fess for medical management.     Darlene Murray, P.A.   ______________________________ Charlett Blake, M.D.    DA/MEDQ  D:  04/19/2015  T:  04/19/2015  Job:  [id number]  cc:   Pramod P. Leonie Man, MD Priscille Heidelberg Little, M.D.

## 2015-04-20 ENCOUNTER — Inpatient Hospital Stay (HOSPITAL_COMMUNITY): Payer: Medicare Other | Admitting: Physical Therapy

## 2015-04-20 MED ORDER — PREDNISONE 5 MG PO TABS: 5.0000 mg | ORAL_TABLET | Freq: Every day | ORAL | Status: DC

## 2015-04-20 MED ORDER — POLYETHYLENE GLYCOL 3350 17 G PO PACK: 17.0000 g | PACK | Freq: Every day | ORAL | Status: DC

## 2015-04-20 MED ORDER — MOMETASONE FURO-FORMOTEROL FUM 100-5 MCG/ACT IN AERO
2.0000 | INHALATION_SPRAY | Freq: Two times a day (BID) | RESPIRATORY_TRACT | Status: DC
Start: 2015-04-20 — End: 2015-07-30

## 2015-04-20 MED ORDER — BETHANECHOL CHLORIDE 10 MG PO TABS: ORAL_TABLET | ORAL | Status: DC

## 2015-04-20 MED ORDER — PRAVASTATIN SODIUM 20 MG PO TABS: 20.0000 mg | ORAL_TABLET | Freq: Every day | ORAL | Status: DC

## 2015-04-20 NOTE — Plan of Care (Signed)
Problem: RH BLADDER ELIMINATION Goal: RH STG MANAGE BLADDER WITH ASSISTANCE STG Manage Bladder With mod Assistance  Outcome: Completed/Met Date Met:  04/20/15 Mod I at discharge

## 2015-04-20 NOTE — Plan of Care (Signed)
Problem: RH BOWEL ELIMINATION Goal: RH STG MANAGE BOWEL WITH ASSISTANCE STG Manage Bowel with mod Assistance.  Outcome: Completed/Met Date Met:  04/20/15 Pt Mod I at discharge

## 2015-04-20 NOTE — Progress Notes (Signed)
Physical Therapy Note  Patient Details  Name: Darlene Murray MRN: [id number] Date of Birth: [birth year] Today's Date: 04/20/2015    Time: 850-915 25 minutes  1:1 No c/o pain.  Pt performed gait to bathroom and toilet transfer with mod I.  Pt/husband reviewed simulated car transfer with cuing for safety/technique.  Pt/husband able to review demo safely.  Pt able to gait 200' with distant supervision.  PT reviewed home safety tips with pt and husband, both state they feel safe and ready for d/c home today.   Brayant Dorr 04/20/2015, 9:16 AM

## 2015-04-20 NOTE — Progress Notes (Signed)
Subjective/Complaints:  Excited about going home    ROS-  neg constip, neg for bladder issues , no joint pains, no SOB   Objective: Vital Signs: Blood pressure 121/62, pulse 85, temperature 98.3 F (36.8 C), temperature source Oral, resp. rate 18, weight 75.388 kg (166 lb 3.2 oz), SpO2 96 %. No results found. No results found for this or any previous visit (from the past 72 hour(s)).    General: No acute distress Mood and affect are appropriate Heart: Regular rate and rhythm no rubs murmurs or extra sounds Lungs: Clear to auscultation, breathing unlabored, no rales or wheezes Abdomen: Positive bowel sounds, soft nontender to palpation, nondistended Extremities: No clubbing, cyanosis, or edema Skin: No evidence of breakdown, no evidence of rash Neurologic: Cranial nerves II through XII intact, motor strength is 5/5 in bilateral deltoid, bicep, tricep, grip, hip flexor, knee extensors, ankle dorsiflexor and plantar flexor Sensory exam normal sensation to light touch and proprioception in bilateral upper and lower extremities Cerebellar exam normal finger to nose to finger on left , mild dysmetria on right, some left visual neglect on confrontation but not cosistent, no evidence of nystamus Musculoskeletal: Full range of motion in all 4 extremities. No joint swelling   Assessment/Plan: 1. 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, right ponto-cerebellar and left occipital Stable for D/C today F/u PCP in 1-2 weeks F/u PM&R 3 weeks See D/C summary See D/C instructions .FIM: Function - Bathing Position: Shower Body parts bathed by patient: Right arm, Left arm, Chest, Abdomen, Front perineal area, Buttocks, Right upper leg, Left upper leg, Right lower leg, Left lower leg Body parts bathed by helper: Left lower leg, Right lower leg Assist Level: Supervision or verbal cues  Function- Upper Body Dressing/Undressing What is the patient  wearing?: Pull over shirt/dress, Bra Bra - Perfomed by patient: Thread/unthread right bra strap, Thread/unthread left bra strap, Hook/unhook bra (pull down sports bra) Pull over shirt/dress - Perfomed by patient: Thread/unthread right sleeve, Thread/unthread left sleeve, Put head through opening, Pull shirt over trunk Button up shirt - Perfomed by patient: Thread/unthread right sleeve, Thread/unthread left sleeve, Pull shirt around back, Button/unbutton shirt Assist Level: More than reasonable time Set up : To obtain clothing/put away Function - Lower Body Dressing/Undressing What is the patient wearing?: Underwear, Pants, Socks, Shoes Position: Wheelchair/chair at sink Underwear - Performed by patient: Thread/unthread right underwear leg, Thread/unthread left underwear leg, Pull underwear up/down Pants- Performed by patient: Thread/unthread right pants leg, Thread/unthread left pants leg, Pull pants up/down Pants- Performed by helper: Fasten/unfasten pants Non-skid slipper socks- Performed by patient: Don/doff right sock, Don/doff left sock Socks - Performed by patient: Don/doff right sock, Don/doff left sock Shoes - Performed by patient: Don/doff right shoe, Don/doff left shoe, Fasten right, Fasten left Assist for footwear: Setup Assist for lower body dressing: Supervision or verbal cues  Function - Toileting Toileting activity did not occur: No continent bowel/bladder event Toileting steps completed by patient: Adjust clothing prior to toileting, Performs perineal hygiene, Adjust clothing after toileting Toileting steps completed by helper: Performs perineal hygiene, Adjust clothing after toileting, Adjust clothing prior to toileting [place]: Grab bar or rail Assist level: More than reasonable time  Function - Air cabin crew transfer assistive device: Elevated toilet seat/BSC over toilet, Grab bar, Walker Assist level to toilet: Supervision or verbal  cues Assist level from toilet: Supervision or verbal cues  Function - Chair/bed transfer Chair/bed transfer method: Ambulatory Chair/bed transfer assist level: Supervision  or verbal cues Chair/bed transfer assistive device: Walker, Armrests Chair/bed transfer details: Verbal cues for precautions/safety, Verbal cues for technique  Function - Locomotion: Wheelchair Will patient use wheelchair at discharge?: No Type: Manual Max wheelchair distance: 75 Assist Level: Touching or steadying assistance (Pt > 75%) Wheel 50 feet with 2 turns activity did not occur:  (will be an ambulator at d/c) Assist Level: Touching or steadying assistance (Pt > 75%) Turns around,maneuvers to table,bed, and toilet,negotiates 3% grade,maneuvers on rugs and over doorsills: No Function - Locomotion: Ambulation Assistive device: Walker-rolling Max distance: 190 Assist level: Supervision or verbal cues Assist level: Supervision or verbal cues Walk 50 feet with 2 turns activity did not occur: Safety/medical concerns (weakness and fear of falling) Assist level: Supervision or verbal cues Assist level: Supervision or verbal cues Walk 10 feet on uneven surfaces activity did not occur: Safety/medical concerns (weakness and fear of falling) Assist level: Touching or steadying assistance (Pt > 75%)  Function - Comprehension Comprehension: Auditory Comprehension assist level: Follows basic conversation/direction with no assist  Function - Expression Expression: Verbal Expression assist level: Expresses basic needs/ideas: With extra time/assistive device  Function - Social Interaction Social Interaction assist level: Interacts appropriately with others with medication or extra time (anti-anxiety, antidepressant).  Function - Problem Solving Problem solving assist level: Solves basic 90% of the time/requires cueing < 10% of the time  Function - Memory Memory assist level: Recognizes or recalls 75 - 89% of the  time/requires cueing 10 - 24% of the time Patient normally able to recall (first 3 days only): Current season, That he or she is in a hospital, Staff names and faces Medical Problem List and Plan: 1. 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  DVT Prophylaxis/Anticoagulation: SCDs. Monitor for any signs of DVT 3. Pain Management: Tylenol as needed 4. Steroid-dependent asthma. Continue prednisone as well as Dulera, no wheezing on exam 5. Neuropsych: This patient is capable of making decisions on her own behalf. 6. Skin/Wound Care: Routine skin checks, 7. Fluids/Electrolytes/Nutrition: Routine I&O with follow-up bmet normal,taking 80-100%meals 8. Hyperlipidemia. Pravachol 9.  Hypoalbuminemia will add beneprotein 10.  Constipation adjust laxatives, add dulc supp prn, last BM 10/8, on Senna S 3 BID, + miralax, monitor for excessive stools 11.  Urinary retention- voiding on flomax and urecholine, stop flomax tonite and monitor             LOS (Days) 10 A FACE TO FACE EVALUATION WAS PERFORMED  KIRSTEINS,ANDREW E 04/20/2015, 7:14 AM

## 2015-04-20 NOTE — Plan of Care (Signed)
Problem: RH BOWEL ELIMINATION Goal: RH STG MANAGE BOWEL W/MEDICATION W/ASSISTANCE STG Manage Bowel with Medication with mod Assistance.  Outcome: Completed/Met Date Met:  04/20/15 Pt Mod I with medication

## 2015-04-20 NOTE — Plan of Care (Signed)
Problem: RH PAIN MANAGEMENT Goal: RH STG PAIN MANAGED AT OR BELOW PT'S PAIN GOAL Pain less than or equal to 2  Outcome: Completed/Met Date Met:  04/20/15 No c/o pain

## 2015-04-20 NOTE — Plan of Care (Signed)
Problem: RH SKIN INTEGRITY Goal: RH STG MAINTAIN SKIN INTEGRITY WITH ASSISTANCE STG Maintain Skin Integrity With min Assistance.  Outcome: Completed/Met Date Met:  04/20/15 Skin CDI

## 2015-04-20 NOTE — Progress Notes (Signed)
Pt discharged home with family. Discharge instructions provided by Silvestre Mesi, PA. All questions answered, pt verbalized understanding. Pt escorted off unit in w/c with personal belonging by Haze Boyden, NT.

## 2015-04-22 DIAGNOSIS — E785 Hyperlipidemia, unspecified: Secondary | ICD-10-CM | POA: Diagnosis not present

## 2015-04-22 DIAGNOSIS — I69351 Hemiplegia and hemiparesis following cerebral infarction affecting right dominant side: Secondary | ICD-10-CM | POA: Diagnosis not present

## 2015-04-22 DIAGNOSIS — J45909 Unspecified asthma, uncomplicated: Secondary | ICD-10-CM | POA: Diagnosis not present

## 2015-04-22 DIAGNOSIS — B9620 Unspecified Escherichia coli [E. coli] as the cause of diseases classified elsewhere: Secondary | ICD-10-CM | POA: Diagnosis not present

## 2015-04-22 DIAGNOSIS — N390 Urinary tract infection, site not specified: Secondary | ICD-10-CM | POA: Diagnosis not present

## 2015-04-23 ENCOUNTER — Emergency Department (HOSPITAL_COMMUNITY): Payer: Medicare Other

## 2015-04-23 ENCOUNTER — Inpatient Hospital Stay (HOSPITAL_COMMUNITY)
Admission: EM | Admit: 2015-04-23 | Discharge: 2015-04-27 | DRG: 871 | Disposition: A | Discharge status: Home Health | Payer: Medicare Other | Attending: Internal Medicine | Admitting: Internal Medicine

## 2015-04-23 DIAGNOSIS — G9340 Encephalopathy, unspecified: Secondary | ICD-10-CM | POA: Diagnosis present

## 2015-04-23 DIAGNOSIS — B9620 Unspecified Escherichia coli [E. coli] as the cause of diseases classified elsewhere: Secondary | ICD-10-CM | POA: Diagnosis present

## 2015-04-23 DIAGNOSIS — N390 Urinary tract infection, site not specified: Secondary | ICD-10-CM | POA: Diagnosis not present

## 2015-04-23 DIAGNOSIS — R112 Nausea with vomiting, unspecified: Secondary | ICD-10-CM

## 2015-04-23 DIAGNOSIS — A419 Sepsis, unspecified organism: Principal | ICD-10-CM | POA: Diagnosis present

## 2015-04-23 DIAGNOSIS — Z7952 Long term (current) use of systemic steroids: Secondary | ICD-10-CM

## 2015-04-23 DIAGNOSIS — I519 Heart disease, unspecified: Secondary | ICD-10-CM

## 2015-04-23 DIAGNOSIS — H540 Blindness, both eyes: Secondary | ICD-10-CM | POA: Diagnosis present

## 2015-04-23 DIAGNOSIS — Z8673 Personal history of transient ischemic attack (TIA), and cerebral infarction without residual deficits: Secondary | ICD-10-CM | POA: Diagnosis not present

## 2015-04-23 DIAGNOSIS — R531 Weakness: Secondary | ICD-10-CM

## 2015-04-23 DIAGNOSIS — J4550 Severe persistent asthma, uncomplicated: Secondary | ICD-10-CM | POA: Diagnosis present

## 2015-04-23 DIAGNOSIS — N179 Acute kidney failure, unspecified: Secondary | ICD-10-CM | POA: Diagnosis present

## 2015-04-23 DIAGNOSIS — E876 Hypokalemia: Secondary | ICD-10-CM | POA: Diagnosis present

## 2015-04-23 DIAGNOSIS — I638 Other cerebral infarction: Secondary | ICD-10-CM | POA: Diagnosis not present

## 2015-04-23 DIAGNOSIS — I517 Cardiomegaly: Secondary | ICD-10-CM | POA: Diagnosis present

## 2015-04-23 DIAGNOSIS — R270 Ataxia, unspecified: Secondary | ICD-10-CM | POA: Diagnosis not present

## 2015-04-23 DIAGNOSIS — I872 Venous insufficiency (chronic) (peripheral): Secondary | ICD-10-CM | POA: Diagnosis present

## 2015-04-23 DIAGNOSIS — E785 Hyperlipidemia, unspecified: Secondary | ICD-10-CM

## 2015-04-23 DIAGNOSIS — R509 Fever, unspecified: Secondary | ICD-10-CM | POA: Diagnosis not present

## 2015-04-23 DIAGNOSIS — E860 Dehydration: Secondary | ICD-10-CM | POA: Diagnosis present

## 2015-04-23 DIAGNOSIS — N183 Chronic kidney disease, stage 3 (moderate): Secondary | ICD-10-CM | POA: Diagnosis present

## 2015-04-23 DIAGNOSIS — R4781 Slurred speech: Secondary | ICD-10-CM | POA: Diagnosis not present

## 2015-04-23 DIAGNOSIS — I6389 Other cerebral infarction: Secondary | ICD-10-CM | POA: Diagnosis present

## 2015-04-23 DIAGNOSIS — I6789 Other cerebrovascular disease: Secondary | ICD-10-CM | POA: Diagnosis not present

## 2015-04-23 DIAGNOSIS — Z79899 Other long term (current) drug therapy: Secondary | ICD-10-CM | POA: Diagnosis not present

## 2015-04-23 DIAGNOSIS — I6310 Cerebral infarction due to embolism of unspecified precerebral artery: Secondary | ICD-10-CM | POA: Diagnosis not present

## 2015-04-23 DIAGNOSIS — Z7982 Long term (current) use of aspirin: Secondary | ICD-10-CM | POA: Diagnosis not present

## 2015-04-23 DIAGNOSIS — N189 Chronic kidney disease, unspecified: Secondary | ICD-10-CM

## 2015-04-23 DIAGNOSIS — I5189 Other ill-defined heart diseases: Secondary | ICD-10-CM | POA: Diagnosis present

## 2015-04-23 DIAGNOSIS — R4182 Altered mental status, unspecified: Secondary | ICD-10-CM | POA: Diagnosis not present

## 2015-04-23 DIAGNOSIS — R5383 Other fatigue: Secondary | ICD-10-CM | POA: Diagnosis not present

## 2015-04-23 LAB — COMPREHENSIVE METABOLIC PANEL
ALT: 13 U/L — AB (ref 14–54)
AST: 26 U/L (ref 15–41)
Albumin: 3 g/dL — ABNORMAL LOW (ref 3.5–5.0)
Alkaline Phosphatase: 56 U/L (ref 38–126)
Anion gap: 10 (ref 5–15)
BUN: 17 mg/dL (ref 6–20)
CHLORIDE: 105 mmol/L (ref 101–111)
CO2: 25 mmol/L (ref 22–32)
Calcium: 8.9 mg/dL (ref 8.9–10.3)
Creatinine, Ser: 1.4 mg/dL — ABNORMAL HIGH (ref 0.44–1.00)
GFR calc Af Amer: 37 mL/min — ABNORMAL LOW (ref >60)
GFR, EST NON AFRICAN AMERICAN: 32 mL/min — AB (ref >60)
Glucose, Bld: 110 mg/dL — ABNORMAL HIGH (ref 65–99)
POTASSIUM: 3.7 mmol/L (ref 3.5–5.1)
SODIUM: 140 mmol/L (ref 135–145)
Total Bilirubin: 1.1 mg/dL (ref 0.3–1.2)
Total Protein: 5.9 g/dL — ABNORMAL LOW (ref 6.5–8.1)

## 2015-04-23 LAB — BLOOD GAS, VENOUS

## 2015-04-23 LAB — CBC WITH DIFFERENTIAL/PLATELET
Basophils Absolute: 0 10*3/uL (ref 0.0–0.1)
Basophils Relative: 0 %
EOS PCT: 4 %
Eosinophils Absolute: 0.4 10*3/uL (ref 0.0–0.7)
HCT: 41.5 % (ref 36.0–46.0)
HEMOGLOBIN: 13.6 g/dL (ref 12.0–15.0)
LYMPHS ABS: 1.2 10*3/uL (ref 0.7–4.0)
LYMPHS PCT: 10 %
MCH: 29.1 pg (ref 26.0–34.0)
MCHC: 32.8 g/dL (ref 30.0–36.0)
MCV: 88.9 fL (ref 78.0–100.0)
MONOS PCT: 13 %
Monocytes Absolute: 1.5 10*3/uL — ABNORMAL HIGH (ref 0.1–1.0)
NEUTROS PCT: 73 %
Neutro Abs: 8 10*3/uL — ABNORMAL HIGH (ref 1.7–7.7)
Platelets: 213 10*3/uL (ref 150–400)
RBC: 4.67 MIL/uL (ref 3.87–5.11)
RDW: 14.9 % (ref 11.5–15.5)
WBC: 11 10*3/uL — AB (ref 4.0–10.5)

## 2015-04-23 LAB — URINALYSIS, ROUTINE W REFLEX MICROSCOPIC
BILIRUBIN URINE: NEGATIVE
Glucose, UA: NEGATIVE mg/dL
KETONES UR: NEGATIVE mg/dL
NITRITE: POSITIVE — AB
PROTEIN: 30 mg/dL — AB
Specific Gravity, Urine: 1.014 (ref 1.005–1.030)
UROBILINOGEN UA: 1 mg/dL (ref 0.0–1.0)
pH: 6.5 (ref 5.0–8.0)

## 2015-04-23 LAB — I-STAT VENOUS BLOOD GAS, ED
ACID-BASE EXCESS: 4 mmol/L — AB (ref 0.0–2.0)
Bicarbonate: 29.5 mEq/L — ABNORMAL HIGH (ref 20.0–24.0)
O2 SAT: 52 %
TCO2: 31 mmol/L (ref 0–100)
pCO2, Ven: 48.4 mmHg (ref 45.0–50.0)
pH, Ven: 7.393 — ABNORMAL HIGH (ref 7.250–7.300)
pO2, Ven: 28 mmHg — CL (ref 30.0–45.0)

## 2015-04-23 LAB — APTT: APTT: 27 s (ref 24–37)

## 2015-04-23 LAB — I-STAT TROPONIN, ED: TROPONIN I, POC: 0 ng/mL (ref 0.00–0.08)

## 2015-04-23 LAB — I-STAT CG4 LACTIC ACID, ED
LACTIC ACID, VENOUS: 1.88 mmol/L (ref 0.5–2.0)
LACTIC ACID, VENOUS: 0.69 mmol/L (ref 0.5–2.0)

## 2015-04-23 LAB — URINE MICROSCOPIC-ADD ON

## 2015-04-23 LAB — TROPONIN I: Troponin I: <0.03 ng/mL (ref <0.031)

## 2015-04-23 LAB — PROTIME-INR
INR: 1.18 (ref 0.00–1.49)
PROTHROMBIN TIME: 15.1 s (ref 11.6–15.2)

## 2015-04-23 LAB — MRSA PCR SCREENING: MRSA by PCR: NEGATIVE

## 2015-04-23 MED ORDER — SODIUM CHLORIDE 0.9 % IJ SOLN
3.0000 mL | Freq: Two times a day (BID) | INTRAMUSCULAR | Status: DC
  Administered 2015-04-23 – 2015-04-26 (×7): 3 mL via INTRAVENOUS

## 2015-04-23 MED ORDER — CETYLPYRIDINIUM CHLORIDE 0.05 % MT LIQD
7.0000 mL | Freq: Two times a day (BID) | OROMUCOSAL | Status: DC
  Administered 2015-04-24 – 2015-04-27 (×8): 7 mL via OROMUCOSAL

## 2015-04-23 MED ORDER — ONDANSETRON HCL 4 MG/2ML IJ SOLN: 4.0000 mg | Freq: Four times a day (QID) | INTRAMUSCULAR | Status: DC | PRN

## 2015-04-23 MED ORDER — POLYETHYLENE GLYCOL 3350 17 G PO PACK: 17.0000 g | PACK | Freq: Every day | ORAL | Status: DC | PRN

## 2015-04-23 MED ORDER — SODIUM CHLORIDE 0.9 % IV BOLUS (SEPSIS)
500.0000 mL | INTRAVENOUS | Status: AC
  Administered 2015-04-23: 500 mL via INTRAVENOUS

## 2015-04-23 MED ORDER — VANCOMYCIN HCL 10 G IV SOLR
1500.0000 mg | Freq: Once | INTRAVENOUS | Status: AC
  Administered 2015-04-23: 1500 mg via INTRAVENOUS
  Filled 2015-04-23: qty 1500

## 2015-04-23 MED ORDER — PREDNISONE 10 MG PO TABS
5.0000 mg | ORAL_TABLET | Freq: Every day | ORAL | Status: DC
  Administered 2015-04-23 – 2015-04-24 (×2): 5 mg via ORAL
  Filled 2015-04-23 (×2): qty 1

## 2015-04-23 MED ORDER — PIPERACILLIN-TAZOBACTAM 3.375 G IVPB 30 MIN: 3.3750 g | Freq: Once | INTRAVENOUS | Status: DC

## 2015-04-23 MED ORDER — PIPERACILLIN-TAZOBACTAM 3.375 G IVPB 30 MIN
3.3750 g | Freq: Once | INTRAVENOUS | Status: AC
  Administered 2015-04-23: 3.375 g via INTRAVENOUS
  Filled 2015-04-23: qty 50

## 2015-04-23 MED ORDER — PREDNISONE 10 MG PO TABS: 5.0000 mg | ORAL_TABLET | Freq: Every day | ORAL | Status: DC

## 2015-04-23 MED ORDER — CALCIUM CARBONATE 1250 (500 CA) MG PO TABS
1.0000 | ORAL_TABLET | Freq: Every day | ORAL | Status: DC
  Administered 2015-04-24 – 2015-04-27 (×4): 500 mg via ORAL
  Filled 2015-04-23 (×4): qty 1

## 2015-04-23 MED ORDER — PYRIDOXINE HCL 25 MG PO TABS
25.0000 mg | ORAL_TABLET | Freq: Every day | ORAL | Status: DC
  Administered 2015-04-23 – 2015-04-27 (×5): 25 mg via ORAL
  Filled 2015-04-23 (×6): qty 1

## 2015-04-23 MED ORDER — ALBUTEROL SULFATE (2.5 MG/3ML) 0.083% IN NEBU: 3.0000 mL | INHALATION_SOLUTION | Freq: Four times a day (QID) | RESPIRATORY_TRACT | Status: DC | PRN

## 2015-04-23 MED ORDER — ACETAMINOPHEN 650 MG RE SUPP
650.0000 mg | Freq: Once | RECTAL | Status: AC
  Administered 2015-04-23: 650 mg via RECTAL
  Filled 2015-04-23: qty 1

## 2015-04-23 MED ORDER — VANCOMYCIN HCL IN DEXTROSE 750-5 MG/150ML-% IV SOLN
750.0000 mg | INTRAVENOUS | Status: DC
  Administered 2015-04-24: 750 mg via INTRAVENOUS
  Filled 2015-04-23 (×2): qty 150

## 2015-04-23 MED ORDER — PRAVASTATIN SODIUM 20 MG PO TABS
20.0000 mg | ORAL_TABLET | Freq: Every day | ORAL | Status: DC
  Administered 2015-04-23 – 2015-04-26 (×4): 20 mg via ORAL
  Filled 2015-04-23 (×4): qty 1

## 2015-04-23 MED ORDER — ONDANSETRON HCL 4 MG PO TABS: 4.0000 mg | ORAL_TABLET | Freq: Four times a day (QID) | ORAL | Status: DC | PRN

## 2015-04-23 MED ORDER — ADULT MULTIVITAMIN W/MINERALS CH
1.0000 | ORAL_TABLET | Freq: Every day | ORAL | Status: DC
  Administered 2015-04-24 – 2015-04-27 (×4): 1 via ORAL
  Filled 2015-04-23 (×5): qty 1

## 2015-04-23 MED ORDER — POLYETHYLENE GLYCOL 3350 17 G PO PACK
17.0000 g | PACK | Freq: Every day | ORAL | Status: DC
  Administered 2015-04-23 – 2015-04-26 (×4): 17 g via ORAL
  Filled 2015-04-23 (×5): qty 1

## 2015-04-23 MED ORDER — FLUTICASONE FUROATE-VILANTEROL 200-25 MCG/INH IN AEPB: 1.0000 | INHALATION_SPRAY | Freq: Every day | RESPIRATORY_TRACT | Status: DC

## 2015-04-23 MED ORDER — SODIUM CHLORIDE 0.9 % IV BOLUS (SEPSIS)
1000.0000 mL | INTRAVENOUS | Status: AC
  Administered 2015-04-23: 1000 mL via INTRAVENOUS

## 2015-04-23 MED ORDER — CALCIUM CARBONATE 600 MG PO TABS: 600.0000 mg | ORAL_TABLET | Freq: Every day | ORAL | Status: DC

## 2015-04-23 MED ORDER — MOMETASONE FURO-FORMOTEROL FUM 100-5 MCG/ACT IN AERO
2.0000 | INHALATION_SPRAY | Freq: Two times a day (BID) | RESPIRATORY_TRACT | Status: DC
  Administered 2015-04-23 – 2015-04-27 (×6): 2 via RESPIRATORY_TRACT
  Filled 2015-04-23 (×2): qty 8.8

## 2015-04-23 MED ORDER — PIPERACILLIN-TAZOBACTAM 3.375 G IVPB
3.3750 g | Freq: Three times a day (TID) | INTRAVENOUS | Status: DC
  Administered 2015-04-23 – 2015-04-25 (×6): 3.375 g via INTRAVENOUS
  Filled 2015-04-23 (×7): qty 50

## 2015-04-23 MED ORDER — ASPIRIN EC 325 MG PO TBEC
325.0000 mg | DELAYED_RELEASE_TABLET | Freq: Every day | ORAL | Status: DC
  Administered 2015-04-23 – 2015-04-27 (×5): 325 mg via ORAL
  Filled 2015-04-23 (×5): qty 1

## 2015-04-23 MED ORDER — HEPARIN SODIUM (PORCINE) 5000 UNIT/ML IJ SOLN
5000.0000 [IU] | Freq: Three times a day (TID) | INTRAMUSCULAR | Status: DC
  Administered 2015-04-23 – 2015-04-27 (×11): 5000 [IU] via SUBCUTANEOUS
  Filled 2015-04-23 (×11): qty 1

## 2015-04-23 MED ORDER — VANCOMYCIN HCL 10 G IV SOLR: 1500.0000 mg | Freq: Once | INTRAVENOUS | Status: DC

## 2015-04-23 NOTE — ED Notes (Signed)
In and Out Cath assisted by Para March RN

## 2015-04-23 NOTE — Progress Notes (Signed)
04/23/2015 Patient came from the Emergency room to [facility] at 1755. She was placed in bed and antibiotic was running. She was transfer to bed, given CHG bath, MRSA swab was complete. Skin is fine no breakdown noted, heels is a little dry, swelling at bilateral  Ankles. Patient have moles noted on body. She vomited a little, but said she felt better. She was placed on tele and Elink was called. [facility] RN.

## 2015-04-23 NOTE — ED Notes (Signed)
Patient's husband was unable to rouse the patient this AM. EMS Called.  Per EMS patient tachycardic somnolent and responsive.  Patient became more lethargic during transport. Patient incontinent of urine on arrival to ED

## 2015-04-23 NOTE — Progress Notes (Signed)
04/23/2015 bladder scan done at [facility] she had 999cc in bladder. Spoke to DR Fuller Plan about bladder scan that was ordered at 1735. Per MD is pt holding 400cc leave foley in. [facility] RN.

## 2015-04-23 NOTE — Progress Notes (Addendum)
ANTIBIOTIC CONSULT NOTE - INITIAL  Pharmacy Consult for Vancomycin and Zosyn Indication: rule out sepsis  No Known Allergies  Patient Measurements: Height: 5\' 2"  (157.5 cm) Weight: 166 lb (75.297 kg) IBW/kg (Calculated) : 50.1 Adjusted Body Weight:   Vital Signs: Temp: 102.5 F (39.2 C) (10/17 0934) Temp Source: Rectal (10/17 0934) BP: 101/48 mmHg (10/17 1300) Pulse Rate: 85 (10/17 1300) Intake/Output from previous day:   Intake/Output from this shift:    Labs:  Recent Labs  04/23/15 1009  WBC 11.0*  HGB 13.6  PLT 213  CREATININE 1.40*   Estimated Creatinine Clearance: 24.9 mL/min (by C-G formula based on Cr of 1.4). No results for input(s): VANCOTROUGH, VANCOPEAK, VANCORANDOM, GENTTROUGH, GENTPEAK, GENTRANDOM, TOBRATROUGH, TOBRAPEAK, TOBRARND, AMIKACINPEAK, AMIKACINTROU, AMIKACIN in the last 72 hours.   Microbiology: Recent Results (from the past 720 hour(s))  Urine culture     Status: None   Collection Time: 04/10/15  5:28 PM  Result Value Ref Range Status   Specimen Description URINE, CATHETERIZED  Final   Special Requests NONE  Final   Culture >=100,000 COLONIES/mL ENTEROCOCCUS SPECIES  Final   Report Status 04/12/2015 FINAL  Final   Organism ID, Bacteria ENTEROCOCCUS SPECIES  Final      Susceptibility   Enterococcus species - MIC*    AMPICILLIN <=2 SENSITIVE Sensitive     LEVOFLOXACIN >=8 RESISTANT Resistant     NITROFURANTOIN <=16 SENSITIVE Sensitive     VANCOMYCIN 2 SENSITIVE Sensitive     * >=100,000 COLONIES/mL ENTEROCOCCUS SPECIES    Medical History: Past Medical History  Diagnosis Date  . Blindness   . Asthmatic bronchitis   . Asthma   . Fibrocystic breast disease   . Allergic rhinitis   . Hyperlipidemia   . Venous insufficiency     lower extremities  . HNP (herniated nucleus pulposus), lumbar     L4-L5  . Left ventricular hypertrophy 2008    mild  . Antibiotic-associated diarrhea     Medications:   (Not in a hospital  admission) Scheduled:  Infusions:  Assessment: 81yo female with history of asthma, HLD and blindness presents with AMS. Pharmacy is consulted to dose vancomycin and zosyn for suspected sepsis. Tmax 102.5, WBC 11.0, sCr 1.4.  Goal of Therapy:  Vancomycin trough level 15-20 mcg/ml  Plan:  Vancomycin 1.5g IV once followed by 750mg  q24h Zosyn 3.375g IV q8h Measure antibiotic drug levels at steady state Follow up culture results, renal function and clinical course  Andrey Cota. Diona Foley, PharmD Clinical Pharmacist Pager [phone] 04/23/2015,3:10 PM

## 2015-04-23 NOTE — ED Provider Notes (Signed)
CSN: [id number]     Arrival date & time 04/23/15  7035 History   None    Chief Complaint  Patient presents with  . Altered Mental Status     (Consider location/radiation/quality/duration/timing/severity/associated sxs/prior Treatment) Patient is a 81 y.o. female presenting with altered mental status.  Altered Mental Status Presenting symptoms: behavior changes and lethargy   Severity:  Severe Most recent episode:  Today Episode history:  Continuous Duration: since she woke up this AM. Timing:  Constant Progression:  Unchanged Chronicity:  New Context: recent illness (recent CVA)   Associated symptoms: fever, nausea, vomiting (1 episode per husband) and weakness (was right sided after stroke, today appears worsened generalized, confusion difficult to understand)   Associated symptoms: no headaches     Past Medical History  Diagnosis Date  . Blindness   . Asthmatic bronchitis   . Asthma   . Fibrocystic breast disease   . Allergic rhinitis   . Hyperlipidemia   . Venous insufficiency     lower extremities  . HNP (herniated nucleus pulposus), lumbar     L4-L5  . Left ventricular hypertrophy 2008    mild  . Antibiotic-associated diarrhea    No past surgical history on file. Family History  Problem Relation Age of Onset  . Heart attack Sister   . Alzheimer's disease Sister   . Leukemia Mother   . Asthma Son    Social History  Substance Use Topics  . Smoking status: Never Smoker   . Smokeless tobacco: Never Used  . Alcohol Use: Yes     Comment: wine occasionally   OB History    No data available     Review of Systems  Unable to perform ROS: Dementia  Constitutional: Positive for fever.  Cardiovascular: Negative for chest pain.  Gastrointestinal: Positive for nausea and vomiting (1 episode per husband).  Neurological: Positive for weakness (was right sided after stroke, today appears worsened generalized, confusion difficult to understand). Negative for  headaches.      Allergies  Review of patient's allergies indicates no known allergies.  Home Medications   Prior to Admission medications   Medication Sig Start Date End Date Taking? Authorizing Provider  albuterol (PROVENTIL HFA;VENTOLIN HFA) 108 (90 BASE) MCG/ACT inhaler Inhale 2 puffs into the lungs every 6 (six) hours as needed for wheezing or shortness of breath. 10/16/14  Yes Elsie Stain, MD  aspirin EC 325 MG EC tablet Take 1 tablet (325 mg total) by mouth daily. 04/08/15  Yes Debbe Odea, MD  bethanechol (URECHOLINE) 10 MG tablet 1 tablet 3 times daily 3 days then 2 tablets twice daily 3 days and stop 04/20/15  Yes Daniel J Angiulli, PA-C  calcium carbonate (OS-CAL) 600 MG TABS Take 600 mg by mouth daily.     Yes Historical Provider, MD  Fluticasone Furoate-Vilanterol (BREO ELLIPTA) 200-25 MCG/INH AEPB Inhale 1 puff into the lungs daily. 02/28/15  Yes Elsie Stain, MD  mometasone-formoterol (DULERA) 100-5 MCG/ACT AERO Inhale 2 puffs into the lungs 2 (two) times daily. 04/20/15  Yes Daniel J Angiulli, PA-C  Multiple Vitamin (MULTIVITAMIN) tablet Take 1 tablet by mouth daily.   Yes Historical Provider, MD  polyethylene glycol (MIRALAX / GLYCOLAX) packet Take 17 g by mouth daily. 04/20/15  Yes Daniel J Angiulli, PA-C  pravastatin (PRAVACHOL) 20 MG tablet Take 1 tablet (20 mg total) by mouth daily at 6 PM. 04/20/15  Yes Daniel J Angiulli, PA-C  predniSONE (DELTASONE) 5 MG tablet Take 1 tablet (5 mg  total) by mouth daily. 04/20/15  Yes Daniel J Angiulli, PA-C  Pyridoxine HCl (VITAMIN B-6 PO) Take 1 tablet by mouth daily.   Yes Historical Provider, MD   BP 95/49 mmHg  Pulse 76  Temp(Src) 102.5 F (39.2 C) (Rectal)  Resp 12  Ht 5\' 2"  (1.575 m)  Wt 166 lb (75.297 kg)  BMI 30.35 kg/m2  SpO2 99% Physical Exam  Constitutional: She appears listless. She appears ill. No distress.  HENT:  Head: Normocephalic and atraumatic.  Eyes: Conjunctivae and EOM are normal. Pupils are  equal, round, and reactive to light.  Neck: Normal range of motion.  Cardiovascular: Normal rate, regular rhythm, normal heart sounds and intact distal pulses.  Exam reveals no gallop and no friction rub.   No murmur heard. Pulmonary/Chest: Effort normal and breath sounds normal. No respiratory distress. She has no wheezes. She has no rales.  Abdominal: Soft. She exhibits no distension. There is no tenderness. There is no guarding.  Musculoskeletal: She exhibits no edema or tenderness.  Neurological: She appears listless. No sensory deficit. GCS eye subscore is 4. GCS verbal subscore is 2. GCS motor subscore is 6.  Speech difficult to understand Weakness RUE and RLE (husband reports present since recent CVA)  Skin: Skin is warm and dry. No rash noted. She is not diaphoretic. No erythema.  Nursing note and vitals reviewed.   ED Course  Procedures (including critical care time) Labs Review Labs Reviewed  COMPREHENSIVE METABOLIC PANEL - Abnormal; Notable for the following:    Glucose, Bld 110 (*)    Creatinine, Ser 1.40 (*)    Total Protein 5.9 (*)    Albumin 3.0 (*)    ALT 13 (*)    GFR calc non Af Amer 32 (*)    GFR calc Af Amer 37 (*)    All other components within normal limits  CBC WITH DIFFERENTIAL/PLATELET - Abnormal; Notable for the following:    WBC 11.0 (*)    Neutro Abs 8.0 (*)    Monocytes Absolute 1.5 (*)    All other components within normal limits  URINALYSIS, ROUTINE W REFLEX MICROSCOPIC (NOT AT [facility]) - Abnormal; Notable for the following:    APPearance CLOUDY (*)    Hgb urine dipstick SMALL (*)    Protein, ur 30 (*)    Nitrite POSITIVE (*)    Leukocytes, UA LARGE (*)    All other components within normal limits  URINE MICROSCOPIC-ADD ON - Abnormal; Notable for the following:    Bacteria, UA MANY (*)    All other components within normal limits  I-STAT VENOUS BLOOD GAS, ED - Abnormal; Notable for the following:    pH, Ven 7.393 (*)    pO2, Ven 28.0 (*)     Bicarbonate 29.5 (*)    Acid-Base Excess 4.0 (*)    All other components within normal limits  CULTURE, BLOOD (ROUTINE X 2)  CULTURE, BLOOD (ROUTINE X 2)  URINE CULTURE  URINE CULTURE  MRSA PCR SCREENING  BLOOD GAS, VENOUS  PROTIME-INR  APTT  TROPONIN I  COMPREHENSIVE METABOLIC PANEL  CBC  I-STAT CG4 LACTIC ACID, ED  I-STAT TROPOININ, ED  I-STAT CG4 LACTIC ACID, ED    Imaging Review Ct Head Wo Contrast  04/23/2015  CLINICAL DATA:  Altered mental status. Decreased level of consciousness. Lethargy. EXAM: CT HEAD WITHOUT CONTRAST TECHNIQUE: Contiguous axial images were obtained from the base of the skull through the vertex without intravenous contrast. COMPARISON:  04/05/2015 FINDINGS: There is no  evidence of intracranial hemorrhage, brain edema, or other signs of acute infarction. There is no evidence of intracranial mass lesion or mass effect. No abnormal extraaxial fluid collections are identified. Mild diffuse cerebral atrophy remains stable. Ventricles are stable in size. No skull abnormality identified. Partial opacification of ethmoid and sphenoid sinuses again demonstrated. IMPRESSION: No acute intracranial abnormality. Stable mild cerebral atrophy and paranasal sinus disease. Electronically Signed   By: Earle Gell M.D.   On: 04/23/2015 10:52   Dg Chest Port 1 View  04/23/2015  CLINICAL DATA:  Fever. EXAM: PORTABLE CHEST 1 VIEW COMPARISON:  04/05/2015 chest radiograph FINDINGS: Stable cardiomediastinal silhouette with normal heart size and mildly tortuous thoracic aorta. No pneumothorax. No pleural effusion. No pulmonary edema. No focal lung consolidation. There is a mild eventration of the left hemidiaphragm. IMPRESSION: No active disease in the chest. Electronically Signed   By: Ilona Sorrel M.D.   On: 04/23/2015 10:15   I have personally reviewed and evaluated these images and lab results as part of my medical decision-making.   EKG Interpretation   Date/Time:  Monday  April 23 2015 09:37:48 EDT Ventricular Rate:  106 PR Interval:  172 QRS Duration: 150 QT Interval:  367 QTC Calculation: 487 R Axis:   -93 Text Interpretation:  Sinus tachycardia Right bundle branch block Inferior  infarct, acute Anterior infarct, acute Lateral leads are also involved  Confirmed by COOK  MD, BRIAN ([id number]) on 04/23/2015 5:22:40 PM      MDM   Final diagnoses:  Sepsis, due to unspecified organism [facility])  Urinary tract infection without hematuria, site unspecified   81 year old female with a history of asthma, hyperlipidemia, recent CVA CVA, presents with concern of altered mental status and fever.  Patient febrile to 102.5 on arrival to the emergency Department and tachycardic to 107. A code sepsis was initiated, and vanc/zosyn ordered.  Urinalysis concerning for urinary tract infection.  CT head done for concern of altered mental status and shows no sign of intracranial abnormality. Pt admitted to tele in stable condition.    Gareth Morgan, MD 04/23/15 1946

## 2015-04-23 NOTE — ED Notes (Signed)
Patient incontinent upon arrival.  Patient and bed cleaned and placed in dry gown.

## 2015-04-23 NOTE — H&P (Signed)
[facility] History and Physical  Darlene Murray GUY:[id number] DOB: 1921/09/30 DOA: 04/23/2015  Referring physician: ED PCP: Darlene Pac, MD   Chief Complaint: Nausea and vomiting  HPI:  Darlene Murray is a 81 y.o. female with a past medical history of steroid-dependent asthma, dyslipidemia, recent embolic nonhemorrhagic and hemorrhagic stroke; who presents for being acutely altered with symptoms of nausea and vomiting. History is obtained by the patient's son who notes that she is active at baseline able to complete her ADLs even following the stroke. Patient had been at home after being discharged from rehabilitation family noted increased weakness from baseline following the recent stroke.   found to be positive for UTI on admission.     Review of Systems: Otherwise a complete review of systems was performed and negative except as noted above in the history of present illness.    Past Medical History  Diagnosis Date  . Blindness   . Asthmatic bronchitis   . Asthma   . Fibrocystic breast disease   . Allergic rhinitis   . Hyperlipidemia   . Venous insufficiency     lower extremities  . HNP (herniated nucleus pulposus), lumbar     L4-L5  . Left ventricular hypertrophy 2008    mild  . Antibiotic-associated diarrhea      No past surgical history on file.    Social History:  reports that she has never smoked. She has never used smokeless tobacco. She reports that she drinks alcohol. She reports that she does not use illicit drugs. where does patient live--home with husband Can patient participate in ADLs? Y  No Known Allergies  Family History  Problem Relation Age of Onset  . Heart attack Sister   . Alzheimer's disease Sister   . Leukemia Mother   . Asthma Son       FAMILY HISTORY  When questioned  Directly-patient reports  No family history of HTN, CVA ,DIABETES, TB, Cancer CAD, Bleeding Disorders, Sickle Cell, diabetes, anemia, asthma,    Prior to Admission medications   Medication Sig Start Date End Date Taking? Authorizing Provider  albuterol (PROVENTIL HFA;VENTOLIN HFA) 108 (90 BASE) MCG/ACT inhaler Inhale 2 puffs into the lungs every 6 (six) hours as needed for wheezing or shortness of breath. 10/16/14  Yes Elsie Stain, MD  aspirin EC 325 MG EC tablet Take 1 tablet (325 mg total) by mouth daily. 04/08/15  Yes Debbe Odea, MD  bethanechol (URECHOLINE) 10 MG tablet 1 tablet 3 times daily 3 days then 2 tablets twice daily 3 days and stop 04/20/15  Yes Daniel J Angiulli, PA-C  calcium carbonate (OS-CAL) 600 MG TABS Take 600 mg by mouth daily.     Yes Historical Provider, MD  Fluticasone Furoate-Vilanterol (BREO ELLIPTA) 200-25 MCG/INH AEPB Inhale 1 puff into the lungs daily. 02/28/15  Yes Elsie Stain, MD  mometasone-formoterol (DULERA) 100-5 MCG/ACT AERO Inhale 2 puffs into the lungs 2 (two) times daily. 04/20/15  Yes Daniel J Angiulli, PA-C  Multiple Vitamin (MULTIVITAMIN) tablet Take 1 tablet by mouth daily.   Yes Historical Provider, MD  polyethylene glycol (MIRALAX / GLYCOLAX) packet Take 17 g by mouth daily. 04/20/15  Yes Daniel J Angiulli, PA-C  pravastatin (PRAVACHOL) 20 MG tablet Take 1 tablet (20 mg total) by mouth daily at 6 PM. 04/20/15  Yes Daniel J Angiulli, PA-C  predniSONE (DELTASONE) 5 MG tablet Take 1 tablet (5 mg total) by mouth daily. 04/20/15  Yes Daniel J Angiulli, PA-C  Pyridoxine HCl (VITAMIN  B-6 PO) Take 1 tablet by mouth daily.   Yes Historical Provider, MD     Physical Exam: Filed Vitals:   04/23/15 1145 04/23/15 1200 04/23/15 1230 04/23/15 1300  BP: 111/48 98/52 104/49 101/48  Pulse: 97 90 88 85  Temp:      TempSrc:      Resp: 16 16 15 14   Height:      Weight:      SpO2: 100% 100% 99% 100%     Constitutional: Vital signs reviewed. Patient is a ill-appearing elderly female who is lethargic, but responsive to verbal stimuli. Head: Normocephalic and atraumatic  Ear: TM normal  bilaterally  Mouth: no erythema or exudates, MMM  Eyes: PERRL, EOMI, conjunctivae normal, No scleral icterus.  Neck: Supple, Trachea midline normal ROM, No JVD, mass, thyromegaly, or carotid bruit present.  Cardiovascular: Tachycardic. S1 normal, S2 normal, no MRG, pulses symmetric and intact bilaterally  Pulmonary/Chest: CTAB, no wheezes, rales, or rhonchi  Abdominal: Soft. Non-tender, non-distended, bowel sounds are normal, no masses, organomegaly, or guarding present.  GU: no CVA tenderness Musculoskeletal: No joint deformities, erythema, or stiffness, ROM full and no nontender Ext: no edema and no cyanosis, pulses palpable bilaterally (DP and PT)  Hematology: no cervical, inginal, or axillary adenopathy.  Neurological: Lethargic, Strenght is slightly decreased on the right 4 out of 5 and on the left side 5 out of 5, otherwise cranial nerve II-XII are grossly intact, sensory intact to light touch bilaterally.  Skin: Warm, dry and intact. No rash, cyanosis, or clubbing.       Data Review   Micro Results No results found for this or any previous visit (from the past 240 hour(s)).  Radiology Reports Ct Angio Head W/cm &/or Wo Cm  04/07/2015  CLINICAL DATA:  81 year old female with acute intracranial infarcts. Presented with nausea, vomiting and dizziness. Subsequent encounter. EXAM: CT ANGIOGRAPHY HEAD AND NECK TECHNIQUE: Multidetector CT imaging of the head and neck was performed using the standard protocol during bolus administration of intravenous contrast. Multiplanar CT image reconstructions and MIPs were obtained to evaluate the vascular anatomy. Carotid stenosis measurements (when applicable) are obtained utilizing NASCET criteria, using the distal internal carotid diameter as the denominator. CONTRAST:  55mL OMNIPAQUE IOHEXOL 350 MG/ML SOLN COMPARISON:  04/06/2015 brain MR. FINDINGS: CT HEAD Brain: Left thalamic, left hippocampus and right pontine/cerebellar infarct. No intracranial  hemorrhage. Global atrophy without hydrocephalus. No intracranial enhancing lesion. Calvarium and skull base: Negative. Paranasal sinuses: Opacification sphenoid sinuses greater on the right. Orbits: Post lens replacement. CTA NECK Aortic arch: Ectatic 3 vessel aortic arch with plaque. Right carotid system: Ectatic common carotid artery. Plaque right carotid bifurcation with 52% diameter stenosis proximal right internal carotid artery. Ectatic cervical segment. Left carotid system: Ectatic common carotid artery. No significant stenosis left carotid bifurcation or proximal left internal carotid artery. Ectatic cervical segment. Vertebral arteries:Plaque with mild narrowing proximal right vertebral artery. Mild narrowing proximal left vertebral artery. Moderate narrowing left vertebral artery C3-4 level. Skeleton: Cervical spondylotic changes most notable C4-5 thru C5-6. Other neck: No worrisome primary neck mass or lung apical lesion. CTA HEAD Anterior circulation: Internal carotid artery cavernous segment calcification with mild narrowing bilaterally. Anterior circulation without medium or large size vessel significant stenosis or occlusion. Posterior circulation: Right vertebral artery is dominant and mildly ectatic. Narrowing distal left vertebral artery. Ectatic basilar artery with mild narrowing but without high-grade stenosis. No significant stenosis of the proximal posterior cerebral arteries. Fetal type contribution on right. Narrowing distal aspect  of the posterior cerebral arteries bilaterally. Venous sinuses: Negative Anatomic variants: Negative Delayed phase: Negative IMPRESSION: CT HEAD Left thalamic, left hippocampus and right pontine/cerebellar infarct. Opacification sphenoid sinuses greater on the right. CTA NECK Ectatic 3 vessel aortic arch with plaque. Plaque right carotid bifurcation with 52% diameter stenosis proximal right internal carotid artery. No significant stenosis left carotid bifurcation  or proximal left internal carotid artery. Plaque with mild narrowing proximal right vertebral artery. Mild narrowing proximal left vertebral artery. Moderate narrowing left vertebral artery C3-4 level. CTA HEAD Internal carotid artery cavernous segment calcification with mild narrowing bilaterally. Anterior circulation without medium or large size vessel significant stenosis or occlusion. Right vertebral artery is dominant and mildly ectatic. Narrowing distal left vertebral artery. Ectatic basilar artery with mild narrowing but without high-grade stenosis. No significant stenosis of the proximal posterior cerebral arteries. Fetal type contribution on right. Narrowing distal aspect of the posterior cerebral arteries bilaterally. Electronically Signed   By: Genia Del M.D.   On: 04/07/2015 15:36   Ct Head Wo Contrast  04/23/2015  CLINICAL DATA:  Altered mental status. Decreased level of consciousness. Lethargy. EXAM: CT HEAD WITHOUT CONTRAST TECHNIQUE: Contiguous axial images were obtained from the base of the skull through the vertex without intravenous contrast. COMPARISON:  04/05/2015 FINDINGS: There is no evidence of intracranial hemorrhage, brain edema, or other signs of acute infarction. There is no evidence of intracranial mass lesion or mass effect. No abnormal extraaxial fluid collections are identified. Mild diffuse cerebral atrophy remains stable. Ventricles are stable in size. No skull abnormality identified. Partial opacification of ethmoid and sphenoid sinuses again demonstrated. IMPRESSION: No acute intracranial abnormality. Stable mild cerebral atrophy and paranasal sinus disease. Electronically Signed   By: Earle Gell M.D.   On: 04/23/2015 10:52   Ct Head Wo Contrast  04/05/2015  CLINICAL DATA:  Lethargy, vomiting and nausea for 12 hr. EXAM: CT HEAD WITHOUT CONTRAST TECHNIQUE: Contiguous axial images were obtained from the base of the skull through the vertex without intravenous contrast.  COMPARISON:  Head CT 06/28/2014 FINDINGS: No intracranial hemorrhage, mass effect, or midline shift. No hydrocephalus. The basilar cisterns are patent. No evidence of territorial infarct. No intracranial fluid collection. Generalized atrophy, stable in degree from prior exam. Calvarium is intact. Chronic opacification of right side of sphenoid sinus with minimal retained secretions and left sphenoid sinus. Mucosal thickening throughout the ethmoid air cells, mildly progressed. Mastoid air cells are well aerated. IMPRESSION: 1.  No acute intracranial abnormality.  Stable atrophy. 2. Paranasal sinus inflammatory change, progressed from prior. Electronically Signed   By: Jeb Levering M.D.   On: 04/05/2015 22:46   Ct Angio Neck W/cm &/or Wo/cm  04/07/2015  CLINICAL DATA:  81 year old female with acute intracranial infarcts. Presented with nausea, vomiting and dizziness. Subsequent encounter. EXAM: CT ANGIOGRAPHY HEAD AND NECK TECHNIQUE: Multidetector CT imaging of the head and neck was performed using the standard protocol during bolus administration of intravenous contrast. Multiplanar CT image reconstructions and MIPs were obtained to evaluate the vascular anatomy. Carotid stenosis measurements (when applicable) are obtained utilizing NASCET criteria, using the distal internal carotid diameter as the denominator. CONTRAST:  84mL OMNIPAQUE IOHEXOL 350 MG/ML SOLN COMPARISON:  04/06/2015 brain MR. FINDINGS: CT HEAD Brain: Left thalamic, left hippocampus and right pontine/cerebellar infarct. No intracranial hemorrhage. Global atrophy without hydrocephalus. No intracranial enhancing lesion. Calvarium and skull base: Negative. Paranasal sinuses: Opacification sphenoid sinuses greater on the right. Orbits: Post lens replacement. CTA NECK Aortic arch: Ectatic 3 vessel aortic  arch with plaque. Right carotid system: Ectatic common carotid artery. Plaque right carotid bifurcation with 52% diameter stenosis proximal right  internal carotid artery. Ectatic cervical segment. Left carotid system: Ectatic common carotid artery. No significant stenosis left carotid bifurcation or proximal left internal carotid artery. Ectatic cervical segment. Vertebral arteries:Plaque with mild narrowing proximal right vertebral artery. Mild narrowing proximal left vertebral artery. Moderate narrowing left vertebral artery C3-4 level. Skeleton: Cervical spondylotic changes most notable C4-5 thru C5-6. Other neck: No worrisome primary neck mass or lung apical lesion. CTA HEAD Anterior circulation: Internal carotid artery cavernous segment calcification with mild narrowing bilaterally. Anterior circulation without medium or large size vessel significant stenosis or occlusion. Posterior circulation: Right vertebral artery is dominant and mildly ectatic. Narrowing distal left vertebral artery. Ectatic basilar artery with mild narrowing but without high-grade stenosis. No significant stenosis of the proximal posterior cerebral arteries. Fetal type contribution on right. Narrowing distal aspect of the posterior cerebral arteries bilaterally. Venous sinuses: Negative Anatomic variants: Negative Delayed phase: Negative IMPRESSION: CT HEAD Left thalamic, left hippocampus and right pontine/cerebellar infarct. Opacification sphenoid sinuses greater on the right. CTA NECK Ectatic 3 vessel aortic arch with plaque. Plaque right carotid bifurcation with 52% diameter stenosis proximal right internal carotid artery. No significant stenosis left carotid bifurcation or proximal left internal carotid artery. Plaque with mild narrowing proximal right vertebral artery. Mild narrowing proximal left vertebral artery. Moderate narrowing left vertebral artery C3-4 level. CTA HEAD Internal carotid artery cavernous segment calcification with mild narrowing bilaterally. Anterior circulation without medium or large size vessel significant stenosis or occlusion. Right vertebral artery  is dominant and mildly ectatic. Narrowing distal left vertebral artery. Ectatic basilar artery with mild narrowing but without high-grade stenosis. No significant stenosis of the proximal posterior cerebral arteries. Fetal type contribution on right. Narrowing distal aspect of the posterior cerebral arteries bilaterally. Electronically Signed   By: Genia Del M.D.   On: 04/07/2015 15:36   Mr Brain Wo Contrast  04/06/2015  CLINICAL DATA:  81 year old female with with nausea, vomiting and some dizziness. Hyperlipidemia. Subsequent encounter. EXAM: MRI HEAD WITHOUT CONTRAST TECHNIQUE: Multiplanar, multiecho pulse sequences of the brain and surrounding structures were obtained without intravenous contrast. COMPARISON:  04/05/2015 head CT.  11/18/2008 MR. FINDINGS: Exam is motion degraded. Left hippocampal restricted motion with hemorrhage probably represents hemorrhagic infarct rather than herpes encephalitis given the surrounding infarcts as noted below. Clinical correlation recommended. Acute moderate size nonhemorrhagic infarct posterior right upper and mid pons and adjacent right cerebellum. Acute tiny nonhemorrhagic left thalamic infarct. Tiny acute nonhemorrhagic left occipital lobe infarct. Global atrophy without hydrocephalus. No intracranial mass lesion noted on this unenhanced exam. Left vertebral artery may be occluded. Narrowed irregular right vertebral artery and basilar artery. Internal carotid arteries appear patent. Complex opacification right sphenoid sinus and aerated aspect of the upper right pterygoid plate. Mild mucosal thickening remainder of paranasal sinuses. Elongated globes post lens replacement. Transverse ligament hypertrophy. Cervical spondylotic changes with mild spinal stenosis C3-4 and C4-5. Cervical medullary junction unremarkable. Partially empty expanded sella felt to be an incidental finding. IMPRESSION: Left hippocampal restricted motion with hemorrhage probably represents  hemorrhagic infarct rather than herpes encephalitis given the surrounding infarcts as noted below. Clinical correlation recommended. Acute moderate size nonhemorrhagic infarct posterior right upper and mid pons and adjacent right cerebellum. Acute tiny nonhemorrhagic left thalamic infarct. Tiny acute nonhemorrhagic left occipital lobe infarct. Left vertebral artery may be occluded. Narrowed irregular right vertebral artery and basilar artery. Internal carotid arteries appear patent.  Complex opacification right sphenoid sinus and aerated aspect of the upper right pterygoid plate. These results will be called to the ordering clinician or representative by the Radiologist Assistant, and communication documented in the PACS or zVision Dashboard. Electronically Signed   By: Genia Del M.D.   On: 04/06/2015 16:23   Dg Chest Port 1 View  04/23/2015  CLINICAL DATA:  Fever. EXAM: PORTABLE CHEST 1 VIEW COMPARISON:  04/05/2015 chest radiograph FINDINGS: Stable cardiomediastinal silhouette with normal heart size and mildly tortuous thoracic aorta. No pneumothorax. No pleural effusion. No pulmonary edema. No focal lung consolidation. There is a mild eventration of the left hemidiaphragm. IMPRESSION: No active disease in the chest. Electronically Signed   By: Ilona Sorrel M.D.   On: 04/23/2015 10:15   Dg Chest Port 1 View  04/05/2015  CLINICAL DATA:  Shortness of breath today. EXAM: PORTABLE CHEST 1 VIEW COMPARISON:  December 16, 2014 FINDINGS: The heart size and mediastinal contours are stable. The aorta is tortuous. The heart size is normal. Both lungs are clear. The visualized skeletal structures are stable. IMPRESSION: No active cardiopulmonary disease. Electronically Signed   By: Abelardo Diesel M.D.   On: 04/05/2015 22:19     CBC  Recent Labs Lab 04/23/15 1009  WBC 11.0*  HGB 13.6  HCT 41.5  PLT 213  MCV 88.9  MCH 29.1  MCHC 32.8  RDW 14.9  LYMPHSABS 1.2  MONOABS 1.5*  EOSABS 0.4  BASOSABS 0.0     Chemistries   Recent Labs Lab 04/23/15 1009  NA 140  K 3.7  CL 105  CO2 25  GLUCOSE 110*  BUN 17  CREATININE 1.40*  CALCIUM 8.9  AST 26  ALT 13*  ALKPHOS 56  BILITOT 1.1   ------------------------------------------------------------------------------------------------------------------ estimated creatinine clearance is 24.9 mL/min (by C-G formula based on Cr of 1.4). ------------------------------------------------------------------------------------------------------------------ No results for input(s): HGBA1C in the last 72 hours. ------------------------------------------------------------------------------------------------------------------ No results for input(s): CHOL, HDL, LDLCALC, TRIG, CHOLHDL, LDLDIRECT in the last 72 hours. ------------------------------------------------------------------------------------------------------------------ No results for input(s): TSH, T4TOTAL, T3FREE, THYROIDAB in the last 72 hours.  Invalid input(s): FREET3 ------------------------------------------------------------------------------------------------------------------ No results for input(s): VITAMINB12, FOLATE, FERRITIN, TIBC, IRON, RETICCTPCT in the last 72 hours.  Coagulation profile No results for input(s): INR, PROTIME in the last 168 hours.  No results for input(s): DDIMER in the last 72 hours.  Cardiac Enzymes No results for input(s): CKMB, TROPONINI, MYOGLOBIN in the last 168 hours.  Invalid input(s): CK ------------------------------------------------------------------------------------------------------------------ Invalid input(s): POCBNP   CBG: No results for input(s): GLUCAP in the last 168 hours.     EKG: Independently reviewed. Sinus tachycardia with a rate of branch block and signs of inferior infarct  Assessment/Plan Sepsis secondary to complicated urinary tract infection: present on admission. Patient was seen to have a UTI prior to discharge  from the hospital on 10/4 that was positive for enterococcus species. Patient had been in a rehabilitation facility recently and had been on antibiotics which appeared to been an effective. Patient on arrival again seen to be positive on UA, with tachycardia, fever of 100.2, and a leukocytosis of 11. Given 2 L while in the emergency department. -Blood and urine cultures pending  -Empiric antibiotics of vancomycin and Zosyn -Pharmacy to renally dose above -Admitting to telemetry cardiac stepdown  Acute encephalopathy: secondary to above most likely. Patient more lethargic than usual. -If symptoms do not improve patient likely warranted further imaging such as MRI  Nausea with vomiting: acute secondary to above. -prn  Zofran  Abnormal EKG: Sinus tachycardia with ST changes.  -Rechecking EKG stat trending troponins every 6 hours 3  Severe persistent asthma in adult steroid dependent: Stable -Continue patient home inhalers -Continuous pulse oximetry  Hyperlipidemia: Stable  -Continue patient home statin  Diastolic dysfunction: stable     Weakness: Acutely. CT scan is negative for any new changes on admission. -Physical therapy occupational therapy to assess and evaluate   Cerebrovascular accident (CVA) due to embolism [facility]) recently the earlier part of October this year. Repeat CT scan on exam negative this a.m. -Place patient on telemetry -PT OT to eval and treat -Family wants patient to possibly be fitted for a Holter monitor at discharge  AKI on CKD-III: Baseline Cr is 0.96 . Cr is 1.4 and BUN is 17 on admission. Likely due to sepis discontinuation of ACEI or ARB, diruetics and NSAIDs use.  -IVF as above -Check FeNa or FeUrea -US-renal -Follow up renal function by BMP -Avoid ACEI and NSAIDs           Code Status:   full Family Communication: bedside Disposition Plan: admit   Total time spent 55 minutes.Greater than 50% of this time was spent in counseling,  explanation of diagnosis, planning of further management, and coordination of care  [facility] Pager [phone]  If 7PM-7AM, please contact night-coverage [email or web address] Password [email or web address] 04/23/2015, 2:08 PM

## 2015-04-24 ENCOUNTER — Encounter (HOSPITAL_COMMUNITY): Payer: Self-pay | Admitting: *Deleted

## 2015-04-24 LAB — COMPREHENSIVE METABOLIC PANEL
ALBUMIN: 2.5 g/dL — AB (ref 3.5–5.0)
ALT: 14 U/L (ref 14–54)
AST: 22 U/L (ref 15–41)
Alkaline Phosphatase: 54 U/L (ref 38–126)
Anion gap: 8 (ref 5–15)
BUN: 17 mg/dL (ref 6–20)
CHLORIDE: 106 mmol/L (ref 101–111)
CO2: 25 mmol/L (ref 22–32)
Calcium: 8.2 mg/dL — ABNORMAL LOW (ref 8.9–10.3)
Creatinine, Ser: 1.48 mg/dL — ABNORMAL HIGH (ref 0.44–1.00)
GFR calc Af Amer: 34 mL/min — ABNORMAL LOW (ref >60)
GFR calc non Af Amer: 30 mL/min — ABNORMAL LOW (ref >60)
GLUCOSE: 96 mg/dL (ref 65–99)
POTASSIUM: 4 mmol/L (ref 3.5–5.1)
SODIUM: 139 mmol/L (ref 135–145)
Total Bilirubin: 1.2 mg/dL (ref 0.3–1.2)
Total Protein: 5.5 g/dL — ABNORMAL LOW (ref 6.5–8.1)

## 2015-04-24 LAB — CBC
HEMATOCRIT: 38.9 % (ref 36.0–46.0)
Hemoglobin: 12.5 g/dL (ref 12.0–15.0)
MCH: 28.8 pg (ref 26.0–34.0)
MCHC: 32.1 g/dL (ref 30.0–36.0)
MCV: 89.6 fL (ref 78.0–100.0)
Platelets: 250 10*3/uL (ref 150–400)
RBC: 4.34 MIL/uL (ref 3.87–5.11)
RDW: 14.9 % (ref 11.5–15.5)
WBC: 13.8 10*3/uL — ABNORMAL HIGH (ref 4.0–10.5)

## 2015-04-24 MED ORDER — HYDROCORTISONE NA SUCCINATE PF 100 MG IJ SOLR
50.0000 mg | Freq: Two times a day (BID) | INTRAMUSCULAR | Status: DC
  Administered 2015-04-24 – 2015-04-26 (×4): 50 mg via INTRAVENOUS
  Filled 2015-04-24 (×4): qty 2

## 2015-04-24 MED ORDER — SODIUM CHLORIDE 0.9 % IV BOLUS (SEPSIS)
500.0000 mL | Freq: Once | INTRAVENOUS | Status: AC
  Administered 2015-04-24: 500 mL via INTRAVENOUS

## 2015-04-24 MED ORDER — SODIUM CHLORIDE 0.9 % IV SOLN
INTRAVENOUS | Status: AC
  Administered 2015-04-24 (×2): via INTRAVENOUS

## 2015-04-24 NOTE — Progress Notes (Signed)
[facility] PROGRESS NOTE  Darlene Murray BMW:[id number] DOB: 25-May-1922 DOA: 04/23/2015 PCP: Gennette Pac, MD  Length of stay: 1   Assessment/Plan: Active Problems:   Severe persistent asthma in adult steroid dependent   Hyperlipidemia   Diastolic dysfunction   Weakness   Nausea with vomiting   Cerebrovascular accident (CVA) due to embolism ([place])   Ataxia   Acute hemorrhagic infarction of brain ([place])   Sepsis ([place])   Urinary tract infection     Sepsis secondary to complicated urinary tract infection: present on admission.  Recent enterococcal UTI, sensitive to amoxicillin and vancomycin . Patient on arrival again seen to be positive on UA, with tachycardia, fever of 100.2, and a leukocytosis of 11. Given 2 L while in the emergency department. -Blood and urine cultures pending  -Empiric antibiotics of vancomycin and Zosyn -Pharmacy to renally dose above Transfer to telemetry  Acute encephalopathy: secondary to above most likely. Less lethargic this morning. CT of the head did not show any acute intracranial abnormality -If symptoms do not improve patient likely warranted further imaging such as MRI  Nausea with vomiting: acute secondary to above. -prn Zofran  Abnormal EKG: Sinus tachycardia with ST changes.  -Rechecking EKG stat trending troponins every 6 hours 3  Severe persistent asthma in adult steroid dependent: Stable -Continue patient home inhalers, on chronic steroids Suspected adrenal insufficiency given low blood pressure, will start the patient on stress dose steroids  Hyperlipidemia: Stable -Continue patient home statin  Diastolic dysfunction: stable    Weakness: Acutely. CT scan is negative for any new changes on admission. -Physical therapy occupational therapy recommended home health/SNF/24-hour supervision  Cerebrovascular accident (CVA) due to embolism [facility]) recently the earlier part of October this year. Continue on  telemetry -PT OT to eval and treat -Family wants patient to possibly be fitted for a Holter monitor at discharge. Will notify cardiology   AKI on CKD-III: Baseline Cr is 0.96 . Cr is 1.4 and BUN is 17 on admission. Likely due to sepis discontinuation of ACEI or ARB, diruetics and NSAIDs use.  -IVF as above -Check FeNa or FeUrea -Follow up renal function by BMP -Avoid ACEI and NSAIDs    DVT prophylaxsis heparin   Code Status:      Code Status Orders        Start     Ordered   04/23/15 1743  Full code   Continuous     04/23/15 1742    Advance Directive Documentation        Most Recent Value   Type of Advance Directive  Living will, Healthcare Power of Attorney   Pre-existing out of facility DNR order (yellow form or pink MOST form)     "MOST" Form in Place?       Family Communication: family updated about patient's clinical progress Disposition Plan:  May need SNF, 24-hour supervision    Brief narrative: 81 y.o. female with a past medical history of steroid-dependent asthma, dyslipidemia, recent embolic nonhemorrhagic and hemorrhagic stroke; who presents for being acutely altered with symptoms of nausea and vomiting. History is obtained by the patient's son who notes that she is active at baseline able to complete her ADLs even following the stroke. Patient had been at home after being discharged from rehabilitation family noted increased weakness from baseline following the recent stroke.  found to be positive for UTI on admission.   Consultants:  None  Procedures:  None  Antibiotics: Anti-infectives  Start     Dose/Rate Route Frequency Ordered Stop   04/24/15 1100  vancomycin (VANCOCIN) IVPB 750 mg/150 ml premix     750 mg 150 mL/hr over 60 Minutes Intravenous Every 24 hours 04/23/15 1517     04/23/15 1745  piperacillin-tazobactam (ZOSYN) IVPB 3.375 g  Status:  Discontinued     3.375 g 100 mL/hr over 30 Minutes Intravenous  Once 04/23/15 1742 04/23/15  1747   04/23/15 1745  vancomycin (VANCOCIN) 1,500 mg in sodium chloride 0.9 % 500 mL IVPB  Status:  Discontinued     1,500 mg 250 mL/hr over 120 Minutes Intravenous  Once 04/23/15 1742 04/23/15 1804   04/23/15 1630  piperacillin-tazobactam (ZOSYN) IVPB 3.375 g     3.375 g 12.5 mL/hr over 240 Minutes Intravenous Every 8 hours 04/23/15 1517     04/23/15 1000  vancomycin (VANCOCIN) 1,500 mg in sodium chloride 0.9 % 500 mL IVPB     1,500 mg 250 mL/hr over 120 Minutes Intravenous  Once 04/23/15 0947 04/23/15 1330   04/23/15 1000  piperacillin-tazobactam (ZOSYN) IVPB 3.375 g     3.375 g 100 mL/hr over 30 Minutes Intravenous  Once 04/23/15 0947 04/23/15 1132         HPI/Subjective: Patient recently discharged from [place] on 10/13, does not remember why she was brought in, legally blind, blood pressure soft  Objective: Filed Vitals:   04/24/15 0900 04/24/15 1000 04/24/15 1100 04/24/15 1328  BP: 93/49 98/45 104/62 114/86  Pulse: 67 66 56 64  Temp:    97.1 F (36.2 C)  TempSrc:    Axillary  Resp: 18 18 13 15   Height:      Weight:      SpO2: 100% 100% 100% 100%    Intake/Output Summary (Last 24 hours) at 04/24/15 1348 Last data filed at 04/24/15 1343  Gross per 24 hour  Intake   1826 ml  Output   3615 ml  Net  -1789 ml    Exam:  General: No acute respiratory distress Lungs: Clear to auscultation bilaterally without wheezes or crackles Cardiovascular: Regular rate and rhythm without murmur gallop or rub normal S1 and S2 Abdomen: Nontender, nondistended, soft, bowel sounds positive, no rebound, no ascites, no appreciable mass Extremities: No significant cyanosis, clubbing, or edema bilateral lower extremities     Data Review   Micro Results Recent Results (from the past 240 hour(s))  Blood Culture (routine x 2)     Status: None (Preliminary result)   Collection Time: 04/23/15 10:09 AM  Result Value Ref Range Status   Specimen Description BLOOD RIGHT ARM  Final   Special  Requests BOTTLES DRAWN AEROBIC AND ANAEROBIC 5CC  Final   Culture NO GROWTH 1 DAY  Final   Report Status PENDING  Incomplete  Blood Culture (routine x 2)     Status: None (Preliminary result)   Collection Time: 04/23/15 10:13 AM  Result Value Ref Range Status   Specimen Description BLOOD RIGHT HAND  Final   Special Requests BOTTLES DRAWN AEROBIC AND ANAEROBIC 5CC  Final   Culture  Setup Time   Final    GRAM NEGATIVE RODS IN BOTH AEROBIC AND ANAEROBIC BOTTLES CRITICAL RESULT CALLED TO, READ BACK BY AND VERIFIED WITH: B REAP RN [id number] 04/23/15 A BROWNING    Culture TOO YOUNG TO READ  Final   Report Status PENDING  Incomplete  Urine culture     Status: None (Preliminary result)   Collection Time: 04/23/15 11:06 AM  Result  Value Ref Range Status   Specimen Description URINE, RANDOM  Final   Special Requests NONE  Final   Culture >=100,000 COLONIES/mL ESCHERICHIA COLI  Final   Report Status PENDING  Incomplete  MRSA PCR Screening     Status: None   Collection Time: 04/23/15  7:07 PM  Result Value Ref Range Status   MRSA by PCR NEGATIVE NEGATIVE Final    Comment:        The GeneXpert MRSA Assay (FDA approved for NASAL specimens only), is one component of a comprehensive MRSA colonization surveillance program. It is not intended to diagnose MRSA infection nor to guide or monitor treatment for MRSA infections.     Radiology Reports Ct Angio Head W/cm &/or Wo Cm  04/07/2015  CLINICAL DATA:  81 year old female with acute intracranial infarcts. Presented with nausea, vomiting and dizziness. Subsequent encounter. EXAM: CT ANGIOGRAPHY HEAD AND NECK TECHNIQUE: Multidetector CT imaging of the head and neck was performed using the standard protocol during bolus administration of intravenous contrast. Multiplanar CT image reconstructions and MIPs were obtained to evaluate the vascular anatomy. Carotid stenosis measurements (when applicable) are obtained utilizing NASCET criteria, using the  distal internal carotid diameter as the denominator. CONTRAST:  10mL OMNIPAQUE IOHEXOL 350 MG/ML SOLN COMPARISON:  04/06/2015 brain MR. FINDINGS: CT HEAD Brain: Left thalamic, left hippocampus and right pontine/cerebellar infarct. No intracranial hemorrhage. Global atrophy without hydrocephalus. No intracranial enhancing lesion. Calvarium and skull base: Negative. Paranasal sinuses: Opacification sphenoid sinuses greater on the right. Orbits: Post lens replacement. CTA NECK Aortic arch: Ectatic 3 vessel aortic arch with plaque. Right carotid system: Ectatic common carotid artery. Plaque right carotid bifurcation with 52% diameter stenosis proximal right internal carotid artery. Ectatic cervical segment. Left carotid system: Ectatic common carotid artery. No significant stenosis left carotid bifurcation or proximal left internal carotid artery. Ectatic cervical segment. Vertebral arteries:Plaque with mild narrowing proximal right vertebral artery. Mild narrowing proximal left vertebral artery. Moderate narrowing left vertebral artery C3-4 level. Skeleton: Cervical spondylotic changes most notable C4-5 thru C5-6. Other neck: No worrisome primary neck mass or lung apical lesion. CTA HEAD Anterior circulation: Internal carotid artery cavernous segment calcification with mild narrowing bilaterally. Anterior circulation without medium or large size vessel significant stenosis or occlusion. Posterior circulation: Right vertebral artery is dominant and mildly ectatic. Narrowing distal left vertebral artery. Ectatic basilar artery with mild narrowing but without high-grade stenosis. No significant stenosis of the proximal posterior cerebral arteries. Fetal type contribution on right. Narrowing distal aspect of the posterior cerebral arteries bilaterally. Venous sinuses: Negative Anatomic variants: Negative Delayed phase: Negative IMPRESSION: CT HEAD Left thalamic, left hippocampus and right pontine/cerebellar infarct.  Opacification sphenoid sinuses greater on the right. CTA NECK Ectatic 3 vessel aortic arch with plaque. Plaque right carotid bifurcation with 52% diameter stenosis proximal right internal carotid artery. No significant stenosis left carotid bifurcation or proximal left internal carotid artery. Plaque with mild narrowing proximal right vertebral artery. Mild narrowing proximal left vertebral artery. Moderate narrowing left vertebral artery C3-4 level. CTA HEAD Internal carotid artery cavernous segment calcification with mild narrowing bilaterally. Anterior circulation without medium or large size vessel significant stenosis or occlusion. Right vertebral artery is dominant and mildly ectatic. Narrowing distal left vertebral artery. Ectatic basilar artery with mild narrowing but without high-grade stenosis. No significant stenosis of the proximal posterior cerebral arteries. Fetal type contribution on right. Narrowing distal aspect of the posterior cerebral arteries bilaterally. Electronically Signed   By: Genia Del M.D.   On: 04/07/2015  15:36   Ct Head Wo Contrast  04/23/2015  CLINICAL DATA:  Altered mental status. Decreased level of consciousness. Lethargy. EXAM: CT HEAD WITHOUT CONTRAST TECHNIQUE: Contiguous axial images were obtained from the base of the skull through the vertex without intravenous contrast. COMPARISON:  04/05/2015 FINDINGS: There is no evidence of intracranial hemorrhage, brain edema, or other signs of acute infarction. There is no evidence of intracranial mass lesion or mass effect. No abnormal extraaxial fluid collections are identified. Mild diffuse cerebral atrophy remains stable. Ventricles are stable in size. No skull abnormality identified. Partial opacification of ethmoid and sphenoid sinuses again demonstrated. IMPRESSION: No acute intracranial abnormality. Stable mild cerebral atrophy and paranasal sinus disease. Electronically Signed   By: Earle Gell M.D.   On: 04/23/2015 10:52    Ct Head Wo Contrast  04/05/2015  CLINICAL DATA:  Lethargy, vomiting and nausea for 12 hr. EXAM: CT HEAD WITHOUT CONTRAST TECHNIQUE: Contiguous axial images were obtained from the base of the skull through the vertex without intravenous contrast. COMPARISON:  Head CT 06/28/2014 FINDINGS: No intracranial hemorrhage, mass effect, or midline shift. No hydrocephalus. The basilar cisterns are patent. No evidence of territorial infarct. No intracranial fluid collection. Generalized atrophy, stable in degree from prior exam. Calvarium is intact. Chronic opacification of right side of sphenoid sinus with minimal retained secretions and left sphenoid sinus. Mucosal thickening throughout the ethmoid air cells, mildly progressed. Mastoid air cells are well aerated. IMPRESSION: 1.  No acute intracranial abnormality.  Stable atrophy. 2. Paranasal sinus inflammatory change, progressed from prior. Electronically Signed   By: Jeb Levering M.D.   On: 04/05/2015 22:46   Ct Angio Neck W/cm &/or Wo/cm  04/07/2015  CLINICAL DATA:  81 year old female with acute intracranial infarcts. Presented with nausea, vomiting and dizziness. Subsequent encounter. EXAM: CT ANGIOGRAPHY HEAD AND NECK TECHNIQUE: Multidetector CT imaging of the head and neck was performed using the standard protocol during bolus administration of intravenous contrast. Multiplanar CT image reconstructions and MIPs were obtained to evaluate the vascular anatomy. Carotid stenosis measurements (when applicable) are obtained utilizing NASCET criteria, using the distal internal carotid diameter as the denominator. CONTRAST:  89mL OMNIPAQUE IOHEXOL 350 MG/ML SOLN COMPARISON:  04/06/2015 brain MR. FINDINGS: CT HEAD Brain: Left thalamic, left hippocampus and right pontine/cerebellar infarct. No intracranial hemorrhage. Global atrophy without hydrocephalus. No intracranial enhancing lesion. Calvarium and skull base: Negative. Paranasal sinuses: Opacification sphenoid  sinuses greater on the right. Orbits: Post lens replacement. CTA NECK Aortic arch: Ectatic 3 vessel aortic arch with plaque. Right carotid system: Ectatic common carotid artery. Plaque right carotid bifurcation with 52% diameter stenosis proximal right internal carotid artery. Ectatic cervical segment. Left carotid system: Ectatic common carotid artery. No significant stenosis left carotid bifurcation or proximal left internal carotid artery. Ectatic cervical segment. Vertebral arteries:Plaque with mild narrowing proximal right vertebral artery. Mild narrowing proximal left vertebral artery. Moderate narrowing left vertebral artery C3-4 level. Skeleton: Cervical spondylotic changes most notable C4-5 thru C5-6. Other neck: No worrisome primary neck mass or lung apical lesion. CTA HEAD Anterior circulation: Internal carotid artery cavernous segment calcification with mild narrowing bilaterally. Anterior circulation without medium or large size vessel significant stenosis or occlusion. Posterior circulation: Right vertebral artery is dominant and mildly ectatic. Narrowing distal left vertebral artery. Ectatic basilar artery with mild narrowing but without high-grade stenosis. No significant stenosis of the proximal posterior cerebral arteries. Fetal type contribution on right. Narrowing distal aspect of the posterior cerebral arteries bilaterally. Venous sinuses: Negative Anatomic variants: Negative Delayed  phase: Negative IMPRESSION: CT HEAD Left thalamic, left hippocampus and right pontine/cerebellar infarct. Opacification sphenoid sinuses greater on the right. CTA NECK Ectatic 3 vessel aortic arch with plaque. Plaque right carotid bifurcation with 52% diameter stenosis proximal right internal carotid artery. No significant stenosis left carotid bifurcation or proximal left internal carotid artery. Plaque with mild narrowing proximal right vertebral artery. Mild narrowing proximal left vertebral artery. Moderate  narrowing left vertebral artery C3-4 level. CTA HEAD Internal carotid artery cavernous segment calcification with mild narrowing bilaterally. Anterior circulation without medium or large size vessel significant stenosis or occlusion. Right vertebral artery is dominant and mildly ectatic. Narrowing distal left vertebral artery. Ectatic basilar artery with mild narrowing but without high-grade stenosis. No significant stenosis of the proximal posterior cerebral arteries. Fetal type contribution on right. Narrowing distal aspect of the posterior cerebral arteries bilaterally. Electronically Signed   By: Genia Del M.D.   On: 04/07/2015 15:36   Mr Brain Wo Contrast  04/06/2015  CLINICAL DATA:  81 year old female with with nausea, vomiting and some dizziness. Hyperlipidemia. Subsequent encounter. EXAM: MRI HEAD WITHOUT CONTRAST TECHNIQUE: Multiplanar, multiecho pulse sequences of the brain and surrounding structures were obtained without intravenous contrast. COMPARISON:  04/05/2015 head CT.  11/18/2008 MR. FINDINGS: Exam is motion degraded. Left hippocampal restricted motion with hemorrhage probably represents hemorrhagic infarct rather than herpes encephalitis given the surrounding infarcts as noted below. Clinical correlation recommended. Acute moderate size nonhemorrhagic infarct posterior right upper and mid pons and adjacent right cerebellum. Acute tiny nonhemorrhagic left thalamic infarct. Tiny acute nonhemorrhagic left occipital lobe infarct. Global atrophy without hydrocephalus. No intracranial mass lesion noted on this unenhanced exam. Left vertebral artery may be occluded. Narrowed irregular right vertebral artery and basilar artery. Internal carotid arteries appear patent. Complex opacification right sphenoid sinus and aerated aspect of the upper right pterygoid plate. Mild mucosal thickening remainder of paranasal sinuses. Elongated globes post lens replacement. Transverse ligament hypertrophy.  Cervical spondylotic changes with mild spinal stenosis C3-4 and C4-5. Cervical medullary junction unremarkable. Partially empty expanded sella felt to be an incidental finding. IMPRESSION: Left hippocampal restricted motion with hemorrhage probably represents hemorrhagic infarct rather than herpes encephalitis given the surrounding infarcts as noted below. Clinical correlation recommended. Acute moderate size nonhemorrhagic infarct posterior right upper and mid pons and adjacent right cerebellum. Acute tiny nonhemorrhagic left thalamic infarct. Tiny acute nonhemorrhagic left occipital lobe infarct. Left vertebral artery may be occluded. Narrowed irregular right vertebral artery and basilar artery. Internal carotid arteries appear patent. Complex opacification right sphenoid sinus and aerated aspect of the upper right pterygoid plate. These results will be called to the ordering clinician or representative by the Radiologist Assistant, and communication documented in the PACS or zVision Dashboard. Electronically Signed   By: Genia Del M.D.   On: 04/06/2015 16:23   Dg Chest Port 1 View  04/23/2015  CLINICAL DATA:  Fever. EXAM: PORTABLE CHEST 1 VIEW COMPARISON:  04/05/2015 chest radiograph FINDINGS: Stable cardiomediastinal silhouette with normal heart size and mildly tortuous thoracic aorta. No pneumothorax. No pleural effusion. No pulmonary edema. No focal lung consolidation. There is a mild eventration of the left hemidiaphragm. IMPRESSION: No active disease in the chest. Electronically Signed   By: Ilona Sorrel M.D.   On: 04/23/2015 10:15   Dg Chest Port 1 View  04/05/2015  CLINICAL DATA:  Shortness of breath today. EXAM: PORTABLE CHEST 1 VIEW COMPARISON:  December 16, 2014 FINDINGS: The heart size and mediastinal contours are stable. The aorta is tortuous. The  heart size is normal. Both lungs are clear. The visualized skeletal structures are stable. IMPRESSION: No active cardiopulmonary disease.  Electronically Signed   By: Abelardo Diesel M.D.   On: 04/05/2015 22:19     CBC  Recent Labs Lab 04/23/15 1009 04/24/15 0332  WBC 11.0* 13.8*  HGB 13.6 12.5  HCT 41.5 38.9  PLT 213 250  MCV 88.9 89.6  MCH 29.1 28.8  MCHC 32.8 32.1  RDW 14.9 14.9  LYMPHSABS 1.2  --   MONOABS 1.5*  --   EOSABS 0.4  --   BASOSABS 0.0  --     Chemistries   Recent Labs Lab 04/23/15 1009 04/24/15 0332  NA 140 139  K 3.7 4.0  CL 105 106  CO2 25 25  GLUCOSE 110* 96  BUN 17 17  CREATININE 1.40* 1.48*  CALCIUM 8.9 8.2*  AST 26 22  ALT 13* 14  ALKPHOS 56 54  BILITOT 1.1 1.2   ------------------------------------------------------------------------------------------------------------------ estimated creatinine clearance is 23.5 mL/min (by C-G formula based on Cr of 1.48). ------------------------------------------------------------------------------------------------------------------ No results for input(s): HGBA1C in the last 72 hours. ------------------------------------------------------------------------------------------------------------------ No results for input(s): CHOL, HDL, LDLCALC, TRIG, CHOLHDL, LDLDIRECT in the last 72 hours. ------------------------------------------------------------------------------------------------------------------ No results for input(s): TSH, T4TOTAL, T3FREE, THYROIDAB in the last 72 hours.  Invalid input(s): FREET3 ------------------------------------------------------------------------------------------------------------------ No results for input(s): VITAMINB12, FOLATE, FERRITIN, TIBC, IRON, RETICCTPCT in the last 72 hours.  Coagulation profile  Recent Labs Lab 04/23/15 1856  INR 1.18    No results for input(s): DDIMER in the last 72 hours.  Cardiac Enzymes  Recent Labs Lab 04/23/15 1856  TROPONINI <0.03   ------------------------------------------------------------------------------------------------------------------ Invalid  input(s): POCBNP   CBG: No results for input(s): GLUCAP in the last 168 hours.     Studies: Ct Head Wo Contrast  04/23/2015  CLINICAL DATA:  Altered mental status. Decreased level of consciousness. Lethargy. EXAM: CT HEAD WITHOUT CONTRAST TECHNIQUE: Contiguous axial images were obtained from the base of the skull through the vertex without intravenous contrast. COMPARISON:  04/05/2015 FINDINGS: There is no evidence of intracranial hemorrhage, brain edema, or other signs of acute infarction. There is no evidence of intracranial mass lesion or mass effect. No abnormal extraaxial fluid collections are identified. Mild diffuse cerebral atrophy remains stable. Ventricles are stable in size. No skull abnormality identified. Partial opacification of ethmoid and sphenoid sinuses again demonstrated. IMPRESSION: No acute intracranial abnormality. Stable mild cerebral atrophy and paranasal sinus disease. Electronically Signed   By: Earle Gell M.D.   On: 04/23/2015 10:52   Dg Chest Port 1 View  04/23/2015  CLINICAL DATA:  Fever. EXAM: PORTABLE CHEST 1 VIEW COMPARISON:  04/05/2015 chest radiograph FINDINGS: Stable cardiomediastinal silhouette with normal heart size and mildly tortuous thoracic aorta. No pneumothorax. No pleural effusion. No pulmonary edema. No focal lung consolidation. There is a mild eventration of the left hemidiaphragm. IMPRESSION: No active disease in the chest. Electronically Signed   By: Ilona Sorrel M.D.   On: 04/23/2015 10:15      Lab Results  Component Value Date   HGBA1C 6.0* 04/07/2015   HGBA1C  11/19/2008    5.6 (NOTE) The ADA recommends the following therapeutic goal for glycemic control related to Hgb A1c measurement: Goal of therapy: <6.5 Hgb A1c  Reference: American Diabetes Association: Clinical Practice Recommendations 2010, Diabetes Care, 2010, 33: (Suppl  1).   Lab Results  Component Value Date   LDLCALC 104* 04/07/2015   CREATININE 1.48* 04/24/2015  Scheduled Meds: . antiseptic oral rinse  7 mL Mouth Rinse BID  . aspirin EC  325 mg Oral Daily  . calcium carbonate  1 tablet Oral Q breakfast  . heparin  5,000 Units Subcutaneous 3 times per day  . mometasone-formoterol  2 puff Inhalation BID  . multivitamin with minerals  1 tablet Oral Daily  . piperacillin-tazobactam (ZOSYN)  IV  3.375 g Intravenous Q8H  . polyethylene glycol  17 g Oral Daily  . pravastatin  20 mg Oral q1800  . predniSONE  5 mg Oral Q breakfast  . vitamin B-6  25 mg Oral Daily  . sodium chloride  3 mL Intravenous Q12H  . vancomycin  750 mg Intravenous Q24H   Continuous Infusions:   Active Problems:   Severe persistent asthma in adult steroid dependent   Hyperlipidemia   Diastolic dysfunction   Weakness   Nausea with vomiting   Cerebrovascular accident (CVA) due to embolism ([place])   Ataxia   Acute hemorrhagic infarction of brain ([place])   Sepsis ([place])   Urinary tract infection    Time spent: 45 minutes   [facility] Pager [phone]. If 7PM-7AM, please contact night-coverage at [email or web address], password [email or web address] 04/24/2015, 1:48 PM  LOS: 1 day

## 2015-04-24 NOTE — Progress Notes (Signed)
   04/24/15 1400  Clinical Encounter Type  Visited With Patient and family together  Visit Type Spiritual support  Referral From Other (Comment) (Just stopped in)  Spiritual Encounters  Spiritual Needs Emotional  Stress Factors  Patient Stress Factors Health changes  Family Stress Factors Health changes  Heard music coming from patient's room and stopped to talk. Shine was supposed to start rehab and has been rehospitalized for UTI. She spoke little except to answer questions. 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

## 2015-04-24 NOTE — Care Management Note (Signed)
Case Management Note  Patient Details  Name: Darlene Murray MRN: [id number] Date of Birth: 1921-12-24  Subjective/Objective:      Pt lives with spouse, has rolling walker and BSC, is active with [facility] for RN, PT, and OT.  Notified [facility] liaison that pt has been readmitted and plans to return home with same services when she is medically stable.                           Expected Discharge Plan:  [place]  Discharge planning Services  CM Consult  Post Acute Care Choice:  Resumption of Svcs/PTA Provider  Status of Service:  In process, will continue to follow  Girard Cooter, RN 04/24/2015, 2:27 PM

## 2015-04-24 NOTE — Evaluation (Signed)
Physical Therapy Evaluation Patient Details Name: Darlene Murray MRN: [id number] DOB: 29-Apr-1922 Today's Date: 04/24/2015   History of Present Illness  Pt is a 81 y.o. female admitted with UTI and sepsis. She was recently d/c'd from rehab, 04-19-15, following a CVA. PMH consists of blindness, asthma and dyslipidemia.  Clinical Impression  Pt admitted with above diagnosis. Pt currently with functional limitations due to the deficits listed below (see PT Problem List). On eval, pt required mod assist for bed mobility and sit to stand. +2 mod assist needed for SPT using RW.  Pt will benefit from skilled PT to increase their independence and safety with mobility to allow discharge to the venue listed below.  Pt was actively receiving HHPT at time of admission. She was d/c'd from CIR 04-19-15. Pt and spouse are hopeful that pt can return home with HHPT upon discharge. ST SNF will need to be considered if pt fails to progress.     Follow Up Recommendations Home health PT;Supervision/Assistance - 24 hour    Equipment Recommendations  None recommended by PT    Recommendations for Other Services       Precautions / Restrictions Precautions Precautions: Fall Restrictions Weight Bearing Restrictions: No      Mobility  Bed Mobility         Supine to sit: Mod assist        Transfers   Equipment used: Rolling walker (2 wheeled)   Sit to Stand: Mod assist Stand pivot transfers: Mod assist;+2 physical assistance       General transfer comment: verbal cues for sequencing  Ambulation/Gait Ambulation/Gait assistance: Mod assist Ambulation Distance (Feet): 2 Feet Assistive device: Rolling walker (2 wheeled) Gait Pattern/deviations: Step-to pattern;Trunk flexed;Decreased stride length     General Gait Details: Attempted gait with RW. Pt took 2 steps from bed. Trembling noted, with pt verbalizing, "I just can't." She was able to step back and return to sitting EOB. Pt was then able  to stand and perform SPT with RW bed to recliner.  Stairs            Wheelchair Mobility    Modified Rankin (Stroke Patients Only)       Balance                                             Pertinent Vitals/Pain Pain Assessment: No/denies pain    Home Living Family/patient expects to be discharged to:: Private residence Living Arrangements: Spouse/significant other Available Help at Discharge: Family;Available 24 hours/day Type of Home: House Home Access: Level entry     Home Layout: One level Home Equipment: Walker - 2 wheels;Cane - single point;Grab bars - tub/shower      Prior Function Level of Independence: Needs assistance   Gait / Transfers Assistance Needed: supervision ambulation with RW household distances     Comments: Prior to CVA earlier this month, pt was independent and active in the community.     Hand Dominance   Dominant Hand: Right    Extremity/Trunk Assessment   Upper Extremity Assessment: Defer to OT evaluation           Lower Extremity Assessment: Generalized weakness      Cervical / Trunk Assessment: Kyphotic  Communication   Communication: No difficulties  Cognition Arousal/Alertness: Awake/alert Behavior During Therapy: Flat affect Overall Cognitive Status: Impaired/Different from baseline Area of Impairment: Safety/judgement;Problem solving;Memory  Memory: Decreased short-term memory   Safety/Judgement: Decreased awareness of safety   Problem Solving: Slow processing;Requires verbal cues      General Comments      Exercises        Assessment/Plan    PT Assessment Patient needs continued PT services  PT Diagnosis Difficulty walking;Generalized weakness   PT Problem List Decreased strength;Decreased balance;Decreased activity tolerance;Decreased mobility;Decreased knowledge of precautions;Decreased safety awareness;Decreased cognition  PT Treatment Interventions DME instruction;Gait  training;Therapeutic activities;Therapeutic exercise;Functional mobility training;Patient/family education;Cognitive remediation;Balance training   PT Goals (Current goals can be found in the Care Plan section) Acute Rehab PT Goals Patient Stated Goal: home PT Goal Formulation: With patient/family Time For Goal Achievement: 05/08/15 Potential to Achieve Goals: Fair    Frequency Min 3X/week   Barriers to discharge        Co-evaluation               End of Session Equipment Utilized During Treatment: Gait belt Activity Tolerance: Patient limited by fatigue Patient left: in chair;with family/visitor present;with call bell/phone within reach Nurse Communication: Mobility status         Time: 1040-1105 PT Time Calculation (min) (ACUTE ONLY): 25 min   Charges:   PT Evaluation $Initial PT Evaluation Tier I: 1 Procedure PT Treatments $Therapeutic Activity: 8-22 mins   PT G Codes:        Lorriane Shire 04/24/2015, 11:51 AM

## 2015-04-24 NOTE — Progress Notes (Signed)
[facility]  Patient Status: Active (receiving services up to time of hospitalization)  [facility] is providing the following services: RN, PT and OT  If patient discharges after hours, please call [phone].   Darlene Murray 04/24/2015, 3:20 PM

## 2015-04-24 NOTE — Progress Notes (Signed)
Utilization Review Completed.  

## 2015-04-25 ENCOUNTER — Encounter: Payer: Self-pay | Admitting: Physical Medicine & Rehabilitation

## 2015-04-25 LAB — BASIC METABOLIC PANEL
ANION GAP: 9 (ref 5–15)
BUN: 17 mg/dL (ref 6–20)
CHLORIDE: 109 mmol/L (ref 101–111)
CO2: 26 mmol/L (ref 22–32)
Calcium: 8.4 mg/dL — ABNORMAL LOW (ref 8.9–10.3)
Creatinine, Ser: 1.41 mg/dL — ABNORMAL HIGH (ref 0.44–1.00)
GFR calc Af Amer: 37 mL/min — ABNORMAL LOW (ref >60)
GFR, EST NON AFRICAN AMERICAN: 32 mL/min — AB (ref >60)
GLUCOSE: 100 mg/dL — AB (ref 65–99)
POTASSIUM: 3.8 mmol/L (ref 3.5–5.1)
SODIUM: 144 mmol/L (ref 135–145)

## 2015-04-25 LAB — URINE CULTURE: Culture: >=100000

## 2015-04-25 LAB — CBC
HEMATOCRIT: 35.6 % — AB (ref 36.0–46.0)
HEMOGLOBIN: 11.5 g/dL — AB (ref 12.0–15.0)
MCH: 28.9 pg (ref 26.0–34.0)
MCHC: 32.3 g/dL (ref 30.0–36.0)
MCV: 89.4 fL (ref 78.0–100.0)
Platelets: 252 10*3/uL (ref 150–400)
RBC: 3.98 MIL/uL (ref 3.87–5.11)
RDW: 14.6 % (ref 11.5–15.5)
WBC: 11.4 10*3/uL — AB (ref 4.0–10.5)

## 2015-04-25 MED ORDER — CIPROFLOXACIN HCL 500 MG PO TABS
250.0000 mg | ORAL_TABLET | Freq: Every day | ORAL | Status: DC
  Administered 2015-04-25 – 2015-04-26 (×2): 250 mg via ORAL
  Filled 2015-04-25 (×4): qty 1

## 2015-04-25 MED ORDER — SODIUM CHLORIDE 0.9 % IV BOLUS (SEPSIS)
250.0000 mL | Freq: Once | INTRAVENOUS | Status: AC
  Administered 2015-04-25: 250 mL via INTRAVENOUS

## 2015-04-25 MED ORDER — SODIUM CHLORIDE 0.9 % IV SOLN: INTRAVENOUS | Status: AC

## 2015-04-25 NOTE — Progress Notes (Signed)
NURSING PROGRESS NOTE  Darlene Murray [id number] Transfer Data: 04/25/2015 4:56 PM Attending Provider: Reyne Dumas, MD IXB:[id number],QKSKS Marigene Ehlers, MD Code Status: Full Allergies:  Review of patient's allergies indicates no known allergies. Past Medical History:   has a past medical history of Blindness; Asthmatic bronchitis; Asthma; Fibrocystic breast disease; Allergic rhinitis; Hyperlipidemia; Venous insufficiency; HNP (herniated nucleus pulposus), lumbar; Left ventricular hypertrophy (2008); and Antibiotic-associated diarrhea. Past Surgical History:   has no past surgical history on file. Social History:   reports that she has never smoked. She has never used smokeless tobacco. She reports that she drinks alcohol. She reports that she does not use illicit drugs.  Darlene Murray is a 81 y.o. female patient transferred from 2C>  Blood pressure 133/54, pulse 63, temperature 98.2 F (36.8 C), temperature source Oral, resp. rate 18, height 5\' 2"  (1.575 m), weight 75.297 kg (166 lb), SpO2 98 %.  Cardiac Monitoring: Box # [id number] in place. Cardiac monitor yields:normal sinus rhythm.  IV Fluids:  IV in place, occlusive dsg intact without redness, IV cath forearm left, condition patent and no redness normal saline.   Skin: WDL  Will continue to evaluate and treat per MD orders.   Hendricks Limes RN, BS, BSN

## 2015-04-25 NOTE — Progress Notes (Signed)
Initial Nutrition Assessment  DOCUMENTATION CODES:   Not applicable  INTERVENTION:   Ensure Enlive po daily, each supplement provides 350 kcal and 20 grams of protein  NUTRITION DIAGNOSIS:   Inadequate oral intake related to poor appetite as evidenced by per patient/family report  GOAL:   Patient will meet greater than or equal to 90% of their needs  MONITOR:   PO intake, Supplement acceptance, Labs, Weight trends, I & O's  REASON FOR ASSESSMENT:   Malnutrition Screening Tool  ASSESSMENT:   81 yo Female with PMH of asthma, dyslipidemia, recent embolic nonhemorrhagic and hemorrhagic stroke; who presented for being acutely altered with symptoms of nausea and vomiting. Patient had been at home after being discharged from rehabilitation; family noted increased weakness from baseline following the recent stroke.   Patient spoke with patient and patient's husband.  Patient reports she's eating well, however, husband was nodding "No" as patient speaking with RD.  Patient reported no recent weight loss.  Noted nausea and vomiting PTA.  PO intake 60-100% per flowsheet records.  Pt amenable to trying Ensure Enlive daily.  RD to order.  Nutrition focused physical exam completed.  No muscle or subcutaneous fat depletion noticed.  Diet Order:  Diet Heart Room service appropriate?: Yes; Fluid consistency:: Thin  Skin:  Reviewed, no issues  Last BM:  10/16  Height:   Ht Readings from Last 1 Encounters:  04/23/15 5\' 2"  (1.575 m)    Weight:   Wt Readings from Last 1 Encounters:  04/23/15 166 lb (75.297 kg)    Ideal Body Weight:  50 kg  BMI:  Body mass index is 30.35 kg/(m^2).  Estimated Nutritional Needs:   Kcal:  1600-1800  Protein:  80-90 gm  Fluid:  1.6-1.8 L  EDUCATION NEEDS:   No education needs identified at this time  Arthur Holms, RD, LDN Pager #: [phone] After-Hours Pager #: [phone]

## 2015-04-25 NOTE — Progress Notes (Signed)
ANTIBIOTIC CONSULT NOTE  Pharmacy Consult for Cipro Indication: Ecoli UTI, bacteremia  No Known Allergies  Patient Measurements: Height: 5\' 2"  (157.5 cm) Weight: 166 lb (75.297 kg) IBW/kg (Calculated) : 50.1  Vital Signs: Temp: 97.9 F (36.6 C) (10/19 0800) Temp Source: Oral (10/19 0800) BP: 122/54 mmHg (10/19 0800) Pulse Rate: 62 (10/19 0800) Intake/Output from previous day: 10/18 0701 - 10/19 0700 In: 2498 [P.O.:720; I.V.:978; IV Piggyback:800] Out: 945 [Urine:945] Intake/Output from this shift: Total I/O In: 485 [P.O.:360; I.V.:75; IV Piggyback:50] Out: -   Labs:  Recent Labs  04/23/15 1009 04/24/15 0332 04/25/15 0311  WBC 11.0* 13.8* 11.4*  HGB 13.6 12.5 11.5*  PLT 213 250 252  CREATININE 1.40* 1.48* 1.41*   Estimated Creatinine Clearance: 24.7 mL/min (by C-G formula based on Cr of 1.41). No results for input(s): VANCOTROUGH, VANCOPEAK, VANCORANDOM, GENTTROUGH, GENTPEAK, GENTRANDOM, TOBRATROUGH, TOBRAPEAK, TOBRARND, AMIKACINPEAK, AMIKACINTROU, AMIKACIN in the last 72 hours.   Microbiology: Recent Results (from the past 720 hour(s))  Urine culture     Status: None   Collection Time: 04/10/15  5:28 PM  Result Value Ref Range Status   Specimen Description URINE, CATHETERIZED  Final   Special Requests NONE  Final   Culture >=100,000 COLONIES/mL ENTEROCOCCUS SPECIES  Final   Report Status 04/12/2015 FINAL  Final   Organism ID, Bacteria ENTEROCOCCUS SPECIES  Final      Susceptibility   Enterococcus species - MIC*    AMPICILLIN <=2 SENSITIVE Sensitive     LEVOFLOXACIN >=8 RESISTANT Resistant     NITROFURANTOIN <=16 SENSITIVE Sensitive     VANCOMYCIN 2 SENSITIVE Sensitive     * >=100,000 COLONIES/mL ENTEROCOCCUS SPECIES  Blood Culture (routine x 2)     Status: None (Preliminary result)   Collection Time: 04/23/15 10:09 AM  Result Value Ref Range Status   Specimen Description BLOOD RIGHT ARM  Final   Special Requests BOTTLES DRAWN AEROBIC AND ANAEROBIC 5CC   Final   Culture NO GROWTH 1 DAY  Final   Report Status PENDING  Incomplete  Blood Culture (routine x 2)     Status: None (Preliminary result)   Collection Time: 04/23/15 10:13 AM  Result Value Ref Range Status   Specimen Description BLOOD RIGHT HAND  Final   Special Requests BOTTLES DRAWN AEROBIC AND ANAEROBIC 5CC  Final   Culture  Setup Time   Final    GRAM NEGATIVE RODS IN BOTH AEROBIC AND ANAEROBIC BOTTLES CRITICAL RESULT CALLED TO, READ BACK BY AND VERIFIED WITH: B REAP RN [id number] 04/23/15 A BROWNING    Culture TOO YOUNG TO READ  Final   Report Status PENDING  Incomplete  Urine culture     Status: None   Collection Time: 04/23/15 11:06 AM  Result Value Ref Range Status   Specimen Description URINE, RANDOM  Final   Special Requests NONE  Final   Culture >=100,000 COLONIES/mL ESCHERICHIA COLI  Final   Report Status 04/25/2015 FINAL  Final   Organism ID, Bacteria ESCHERICHIA COLI  Final      Susceptibility   Escherichia coli - MIC*    AMPICILLIN >=32 RESISTANT Resistant     CEFAZOLIN <=4 SENSITIVE Sensitive     CEFTRIAXONE <=1 SENSITIVE Sensitive     CIPROFLOXACIN <=0.25 SENSITIVE Sensitive     GENTAMICIN <=1 SENSITIVE Sensitive     IMIPENEM <=0.25 SENSITIVE Sensitive     NITROFURANTOIN <=16 SENSITIVE Sensitive     TRIMETH/SULFA >=320 RESISTANT Resistant     AMPICILLIN/SULBACTAM >=32 RESISTANT Resistant  PIP/TAZO <=4 SENSITIVE Sensitive     * >=100,000 COLONIES/mL ESCHERICHIA COLI  MRSA PCR Screening     Status: None   Collection Time: 04/23/15  7:07 PM  Result Value Ref Range Status   MRSA by PCR NEGATIVE NEGATIVE Final    Comment:        The GeneXpert MRSA Assay (FDA approved for NASAL specimens only), is one component of a comprehensive MRSA colonization surveillance program. It is not intended to diagnose MRSA infection nor to guide or monitor treatment for MRSA infections.     Medical History: Past Medical History  Diagnosis Date  . Blindness   .  Asthmatic bronchitis   . Asthma   . Fibrocystic breast disease   . Allergic rhinitis   . Hyperlipidemia   . Venous insufficiency     lower extremities  . HNP (herniated nucleus pulposus), lumbar     L4-L5  . Left ventricular hypertrophy 2008    mild  . Antibiotic-associated diarrhea     Medications:  Prescriptions prior to admission  Medication Sig Dispense Refill Last Dose  . albuterol (PROVENTIL HFA;VENTOLIN HFA) 108 (90 BASE) MCG/ACT inhaler Inhale 2 puffs into the lungs every 6 (six) hours as needed for wheezing or shortness of breath. 1 Inhaler 6 04/22/2015 at Unknown time  . aspirin EC 325 MG EC tablet Take 1 tablet (325 mg total) by mouth daily. 30 tablet 0 04/22/2015 at Unknown time  . bethanechol (URECHOLINE) 10 MG tablet 1 tablet 3 times daily 3 days then 2 tablets twice daily 3 days and stop 15 tablet 0   . calcium carbonate (OS-CAL) 600 MG TABS Take 600 mg by mouth daily.     04/22/2015 at Unknown time  . Fluticasone Furoate-Vilanterol (BREO ELLIPTA) 200-25 MCG/INH AEPB Inhale 1 puff into the lungs daily. 60 each 6 04/22/2015 at Unknown time  . mometasone-formoterol (DULERA) 100-5 MCG/ACT AERO Inhale 2 puffs into the lungs 2 (two) times daily. 1 Inhaler 0 04/22/2015 at Unknown time  . Multiple Vitamin (MULTIVITAMIN) tablet Take 1 tablet by mouth daily.   04/22/2015 at Unknown time  . polyethylene glycol (MIRALAX / GLYCOLAX) packet Take 17 g by mouth daily. 14 each 0 04/22/2015 at Unknown time  . pravastatin (PRAVACHOL) 20 MG tablet Take 1 tablet (20 mg total) by mouth daily at 6 PM. 30 tablet 0 04/22/2015 at Unknown time  . predniSONE (DELTASONE) 5 MG tablet Take 1 tablet (5 mg total) by mouth daily. 30 tablet 5 04/22/2015 at Unknown time  . Pyridoxine HCl (VITAMIN B-6 PO) Take 1 tablet by mouth daily.   04/22/2015 at Unknown time     Assessment: 81yo female presents with AMS. Pharmacy is consulted to de-escalate from vanc/zosyn to cipro for Ecoli UTI/bacteremia. Afeb,  WBC down to 11.4. CrCl~24  10/17 vanc>>10/19 10/17 zosyn>>10/19 10/19 cipro>>  10/17 BCx2>> GNR 1/2 10/17 UC>> Ecoli (R-unasyn, amp, bactrim)  Goal of Therapy:  Eradication of infection  Plan:  Cipro 250mg  PO q24h Follow up culture results, renal function, and clinical course  Elicia Lamp, PharmD Clinical Pharmacist Pager [phone] 04/25/2015 10:50 AM

## 2015-04-25 NOTE — Progress Notes (Signed)
[facility] PROGRESS NOTE  Darlene Murray DVV:[id number] DOB: Dec 05, 1921 DOA: 04/23/2015 PCP: Gennette Pac, MD  Length of stay: 2   Assessment/Plan: Active Problems:   Severe persistent asthma in adult steroid dependent   Hyperlipidemia   Diastolic dysfunction   Weakness   Nausea with vomiting   Cerebrovascular accident (CVA) due to embolism ([place])   Ataxia   Acute hemorrhagic infarction of brain ([place])   Sepsis ([place])   Urinary tract infection     Sepsis secondary to complicated urinary tract infection: present on admission.  Recent enterococcal UTI, patient has Escherichia coli UTI, no bacteremia -Blood culture no growth so far Discontinue vancomycin and Zosyn, switched to ciprofloxacin Pharmacy to renally dose ciprofloxacin, will treat for a total of 7 days Transfer to telemetry  Hypotension/dehydration Will give fluid boluses 2, systolic blood pressure in the 90s Continue maintenance IV fluids  Acute encephalopathy: Resolved secondary to above /UTI. Less lethargic this morning. CT of the head did not show any acute intracranial abnormality   Nausea with vomiting: acute secondary to above. Resolved -prn Zofran  Abnormal EKG: Sinus tachycardia with ST changes.  -Rechecking EKG stat trending troponins every 6 hours 3  Severe persistent asthma in adult steroid dependent: Stable -Continue patient home inhalers, on chronic steroids Suspected adrenal insufficiency given low blood pressure, will start the patient on stress dose steroids  Hyperlipidemia: Stable -Continue patient home statin  Diastolic dysfunction: stable    Weakness: Acutely secondary to UTI. CT scan is negative for any new changes on admission. -Physical therapy occupational therapy recommended home health/SNF/24-hour supervision  Cerebrovascular accident (CVA) due to embolism [facility]) recently the earlier part of October this year. Continue on telemetry -PT OT to eval and  treat -Family wants patient to possibly be fitted for a Holter monitor at discharge. Notify cardiology   AKI on CKD-III: Baseline Cr is 0.96 . Slight improvement in renal function 1.48 > 1.41 discontinuation of ACEI or ARB, diruetics and NSAIDs use.  -IVF as above -Check FeNa or FeUrea -Follow up renal function by BMP -Avoid ACEI and NSAIDs    DVT prophylaxsis heparin   Code Status:      Code Status Orders        Start     Ordered   04/23/15 1743  Full code   Continuous     04/23/15 1742    Advance Directive Documentation        Most Recent Value   Type of Advance Directive  Living will, Healthcare Power of Attorney   Pre-existing out of facility DNR order (yellow form or pink MOST form)     "MOST" Form in Place?       Family Communication husband/son by the bedside Disposition Plan:  Anticipate discharge tomorrow if renal function improves    Brief narrative: 80 y.o. female with a past medical history of steroid-dependent asthma, dyslipidemia, recent embolic nonhemorrhagic and hemorrhagic stroke; who presents for being acutely altered with symptoms of nausea and vomiting. History is obtained by the patient's son who notes that she is active at baseline able to complete her ADLs even following the stroke. Patient had been at home after being discharged from rehabilitation family noted increased weakness from baseline following the recent stroke.  found to be positive for UTI on admission.   Consultants:  None  Procedures:  None  Antibiotics: Anti-infectives    Start     Dose/Rate Route Frequency Ordered Stop   04/25/15 1600  ciprofloxacin (CIPRO) tablet 250 mg     250 mg Oral Daily with breakfast 04/25/15 1053     04/24/15 1100  vancomycin (VANCOCIN) IVPB 750 mg/150 ml premix  Status:  Discontinued     750 mg 150 mL/hr over 60 Minutes Intravenous Every 24 hours 04/23/15 1517 04/25/15 1031   04/23/15 1745  piperacillin-tazobactam (ZOSYN) IVPB 3.375 g   Status:  Discontinued     3.375 g 100 mL/hr over 30 Minutes Intravenous  Once 04/23/15 1742 04/23/15 1747   04/23/15 1745  vancomycin (VANCOCIN) 1,500 mg in sodium chloride 0.9 % 500 mL IVPB  Status:  Discontinued     1,500 mg 250 mL/hr over 120 Minutes Intravenous  Once 04/23/15 1742 04/23/15 1804   04/23/15 1630  piperacillin-tazobactam (ZOSYN) IVPB 3.375 g  Status:  Discontinued     3.375 g 12.5 mL/hr over 240 Minutes Intravenous Every 8 hours 04/23/15 1517 04/25/15 1031   04/23/15 1000  vancomycin (VANCOCIN) 1,500 mg in sodium chloride 0.9 % 500 mL IVPB     1,500 mg 250 mL/hr over 120 Minutes Intravenous  Once 04/23/15 0947 04/23/15 1330   04/23/15 1000  piperacillin-tazobactam (ZOSYN) IVPB 3.375 g     3.375 g 100 mL/hr over 30 Minutes Intravenous  Once 04/23/15 0947 04/23/15 1132         HPI/Subjective: Patient is more awake and alert, soft blood pressure in the 42P systolic in the room  Objective: Filed Vitals:   04/25/15 0400 04/25/15 0600 04/25/15 0743 04/25/15 0800  BP: 120/49 120/52  122/54  Pulse: 61 60  62  Temp: 97.8 F (36.6 C)   97.9 F (36.6 C)  TempSrc: Oral   Oral  Resp: 14 14  15   Height:      Weight:      SpO2: 97% 97% 97% 97%    Intake/Output Summary (Last 24 hours) at 04/25/15 1142 Last data filed at 04/25/15 0900  Gross per 24 hour  Intake   2420 ml  Output    655 ml  Net   1765 ml    Exam:  General: No acute respiratory distress Lungs: Clear to auscultation bilaterally without wheezes or crackles Cardiovascular: Regular rate and rhythm without murmur gallop or rub normal S1 and S2 Abdomen: Nontender, nondistended, soft, bowel sounds positive, no rebound, no ascites, no appreciable mass Extremities: No significant cyanosis, clubbing, or edema bilateral lower extremities     Data Review   Micro Results Recent Results (from the past 240 hour(s))  Blood Culture (routine x 2)     Status: None (Preliminary result)   Collection Time:  04/23/15 10:09 AM  Result Value Ref Range Status   Specimen Description BLOOD RIGHT ARM  Final   Special Requests BOTTLES DRAWN AEROBIC AND ANAEROBIC 5CC  Final   Culture NO GROWTH 1 DAY  Final   Report Status PENDING  Incomplete  Blood Culture (routine x 2)     Status: None (Preliminary result)   Collection Time: 04/23/15 10:13 AM  Result Value Ref Range Status   Specimen Description BLOOD RIGHT HAND  Final   Special Requests BOTTLES DRAWN AEROBIC AND ANAEROBIC 5CC  Final   Culture  Setup Time   Final    GRAM NEGATIVE RODS IN BOTH AEROBIC AND ANAEROBIC BOTTLES CRITICAL RESULT CALLED TO, READ BACK BY AND VERIFIED WITH: B REAP RN [id number] 04/23/15 A BROWNING    Culture TOO YOUNG TO READ  Final   Report Status PENDING  Incomplete  Urine  culture     Status: None   Collection Time: 04/23/15 11:06 AM  Result Value Ref Range Status   Specimen Description URINE, RANDOM  Final   Special Requests NONE  Final   Culture >=100,000 COLONIES/mL ESCHERICHIA COLI  Final   Report Status 04/25/2015 FINAL  Final   Organism ID, Bacteria ESCHERICHIA COLI  Final      Susceptibility   Escherichia coli - MIC*    AMPICILLIN >=32 RESISTANT Resistant     CEFAZOLIN <=4 SENSITIVE Sensitive     CEFTRIAXONE <=1 SENSITIVE Sensitive     CIPROFLOXACIN <=0.25 SENSITIVE Sensitive     GENTAMICIN <=1 SENSITIVE Sensitive     IMIPENEM <=0.25 SENSITIVE Sensitive     NITROFURANTOIN <=16 SENSITIVE Sensitive     TRIMETH/SULFA >=320 RESISTANT Resistant     AMPICILLIN/SULBACTAM >=32 RESISTANT Resistant     PIP/TAZO <=4 SENSITIVE Sensitive     * >=100,000 COLONIES/mL ESCHERICHIA COLI  MRSA PCR Screening     Status: None   Collection Time: 04/23/15  7:07 PM  Result Value Ref Range Status   MRSA by PCR NEGATIVE NEGATIVE Final    Comment:        The GeneXpert MRSA Assay (FDA approved for NASAL specimens only), is one component of a comprehensive MRSA colonization surveillance program. It is not intended to diagnose  MRSA infection nor to guide or monitor treatment for MRSA infections.     Radiology Reports Ct Angio Head W/cm &/or Wo Cm  04/07/2015  CLINICAL DATA:  81 year old female with acute intracranial infarcts. Presented with nausea, vomiting and dizziness. Subsequent encounter. EXAM: CT ANGIOGRAPHY HEAD AND NECK TECHNIQUE: Multidetector CT imaging of the head and neck was performed using the standard protocol during bolus administration of intravenous contrast. Multiplanar CT image reconstructions and MIPs were obtained to evaluate the vascular anatomy. Carotid stenosis measurements (when applicable) are obtained utilizing NASCET criteria, using the distal internal carotid diameter as the denominator. CONTRAST:  48mL OMNIPAQUE IOHEXOL 350 MG/ML SOLN COMPARISON:  04/06/2015 brain MR. FINDINGS: CT HEAD Brain: Left thalamic, left hippocampus and right pontine/cerebellar infarct. No intracranial hemorrhage. Global atrophy without hydrocephalus. No intracranial enhancing lesion. Calvarium and skull base: Negative. Paranasal sinuses: Opacification sphenoid sinuses greater on the right. Orbits: Post lens replacement. CTA NECK Aortic arch: Ectatic 3 vessel aortic arch with plaque. Right carotid system: Ectatic common carotid artery. Plaque right carotid bifurcation with 52% diameter stenosis proximal right internal carotid artery. Ectatic cervical segment. Left carotid system: Ectatic common carotid artery. No significant stenosis left carotid bifurcation or proximal left internal carotid artery. Ectatic cervical segment. Vertebral arteries:Plaque with mild narrowing proximal right vertebral artery. Mild narrowing proximal left vertebral artery. Moderate narrowing left vertebral artery C3-4 level. Skeleton: Cervical spondylotic changes most notable C4-5 thru C5-6. Other neck: No worrisome primary neck mass or lung apical lesion. CTA HEAD Anterior circulation: Internal carotid artery cavernous segment calcification with  mild narrowing bilaterally. Anterior circulation without medium or large size vessel significant stenosis or occlusion. Posterior circulation: Right vertebral artery is dominant and mildly ectatic. Narrowing distal left vertebral artery. Ectatic basilar artery with mild narrowing but without high-grade stenosis. No significant stenosis of the proximal posterior cerebral arteries. Fetal type contribution on right. Narrowing distal aspect of the posterior cerebral arteries bilaterally. Venous sinuses: Negative Anatomic variants: Negative Delayed phase: Negative IMPRESSION: CT HEAD Left thalamic, left hippocampus and right pontine/cerebellar infarct. Opacification sphenoid sinuses greater on the right. CTA NECK Ectatic 3 vessel aortic arch with plaque. Plaque right carotid  bifurcation with 52% diameter stenosis proximal right internal carotid artery. No significant stenosis left carotid bifurcation or proximal left internal carotid artery. Plaque with mild narrowing proximal right vertebral artery. Mild narrowing proximal left vertebral artery. Moderate narrowing left vertebral artery C3-4 level. CTA HEAD Internal carotid artery cavernous segment calcification with mild narrowing bilaterally. Anterior circulation without medium or large size vessel significant stenosis or occlusion. Right vertebral artery is dominant and mildly ectatic. Narrowing distal left vertebral artery. Ectatic basilar artery with mild narrowing but without high-grade stenosis. No significant stenosis of the proximal posterior cerebral arteries. Fetal type contribution on right. Narrowing distal aspect of the posterior cerebral arteries bilaterally. Electronically Signed   By: Genia Del M.D.   On: 04/07/2015 15:36   Ct Head Wo Contrast  04/23/2015  CLINICAL DATA:  Altered mental status. Decreased level of consciousness. Lethargy. EXAM: CT HEAD WITHOUT CONTRAST TECHNIQUE: Contiguous axial images were obtained from the base of the skull  through the vertex without intravenous contrast. COMPARISON:  04/05/2015 FINDINGS: There is no evidence of intracranial hemorrhage, brain edema, or other signs of acute infarction. There is no evidence of intracranial mass lesion or mass effect. No abnormal extraaxial fluid collections are identified. Mild diffuse cerebral atrophy remains stable. Ventricles are stable in size. No skull abnormality identified. Partial opacification of ethmoid and sphenoid sinuses again demonstrated. IMPRESSION: No acute intracranial abnormality. Stable mild cerebral atrophy and paranasal sinus disease. Electronically Signed   By: Earle Gell M.D.   On: 04/23/2015 10:52   Ct Head Wo Contrast  04/05/2015  CLINICAL DATA:  Lethargy, vomiting and nausea for 12 hr. EXAM: CT HEAD WITHOUT CONTRAST TECHNIQUE: Contiguous axial images were obtained from the base of the skull through the vertex without intravenous contrast. COMPARISON:  Head CT 06/28/2014 FINDINGS: No intracranial hemorrhage, mass effect, or midline shift. No hydrocephalus. The basilar cisterns are patent. No evidence of territorial infarct. No intracranial fluid collection. Generalized atrophy, stable in degree from prior exam. Calvarium is intact. Chronic opacification of right side of sphenoid sinus with minimal retained secretions and left sphenoid sinus. Mucosal thickening throughout the ethmoid air cells, mildly progressed. Mastoid air cells are well aerated. IMPRESSION: 1.  No acute intracranial abnormality.  Stable atrophy. 2. Paranasal sinus inflammatory change, progressed from prior. Electronically Signed   By: Jeb Levering M.D.   On: 04/05/2015 22:46   Ct Angio Neck W/cm &/or Wo/cm  04/07/2015  CLINICAL DATA:  81 year old female with acute intracranial infarcts. Presented with nausea, vomiting and dizziness. Subsequent encounter. EXAM: CT ANGIOGRAPHY HEAD AND NECK TECHNIQUE: Multidetector CT imaging of the head and neck was performed using the standard  protocol during bolus administration of intravenous contrast. Multiplanar CT image reconstructions and MIPs were obtained to evaluate the vascular anatomy. Carotid stenosis measurements (when applicable) are obtained utilizing NASCET criteria, using the distal internal carotid diameter as the denominator. CONTRAST:  49mL OMNIPAQUE IOHEXOL 350 MG/ML SOLN COMPARISON:  04/06/2015 brain MR. FINDINGS: CT HEAD Brain: Left thalamic, left hippocampus and right pontine/cerebellar infarct. No intracranial hemorrhage. Global atrophy without hydrocephalus. No intracranial enhancing lesion. Calvarium and skull base: Negative. Paranasal sinuses: Opacification sphenoid sinuses greater on the right. Orbits: Post lens replacement. CTA NECK Aortic arch: Ectatic 3 vessel aortic arch with plaque. Right carotid system: Ectatic common carotid artery. Plaque right carotid bifurcation with 52% diameter stenosis proximal right internal carotid artery. Ectatic cervical segment. Left carotid system: Ectatic common carotid artery. No significant stenosis left carotid bifurcation or proximal left internal carotid artery.  Ectatic cervical segment. Vertebral arteries:Plaque with mild narrowing proximal right vertebral artery. Mild narrowing proximal left vertebral artery. Moderate narrowing left vertebral artery C3-4 level. Skeleton: Cervical spondylotic changes most notable C4-5 thru C5-6. Other neck: No worrisome primary neck mass or lung apical lesion. CTA HEAD Anterior circulation: Internal carotid artery cavernous segment calcification with mild narrowing bilaterally. Anterior circulation without medium or large size vessel significant stenosis or occlusion. Posterior circulation: Right vertebral artery is dominant and mildly ectatic. Narrowing distal left vertebral artery. Ectatic basilar artery with mild narrowing but without high-grade stenosis. No significant stenosis of the proximal posterior cerebral arteries. Fetal type contribution  on right. Narrowing distal aspect of the posterior cerebral arteries bilaterally. Venous sinuses: Negative Anatomic variants: Negative Delayed phase: Negative IMPRESSION: CT HEAD Left thalamic, left hippocampus and right pontine/cerebellar infarct. Opacification sphenoid sinuses greater on the right. CTA NECK Ectatic 3 vessel aortic arch with plaque. Plaque right carotid bifurcation with 52% diameter stenosis proximal right internal carotid artery. No significant stenosis left carotid bifurcation or proximal left internal carotid artery. Plaque with mild narrowing proximal right vertebral artery. Mild narrowing proximal left vertebral artery. Moderate narrowing left vertebral artery C3-4 level. CTA HEAD Internal carotid artery cavernous segment calcification with mild narrowing bilaterally. Anterior circulation without medium or large size vessel significant stenosis or occlusion. Right vertebral artery is dominant and mildly ectatic. Narrowing distal left vertebral artery. Ectatic basilar artery with mild narrowing but without high-grade stenosis. No significant stenosis of the proximal posterior cerebral arteries. Fetal type contribution on right. Narrowing distal aspect of the posterior cerebral arteries bilaterally. Electronically Signed   By: Genia Del M.D.   On: 04/07/2015 15:36   Mr Brain Wo Contrast  04/06/2015  CLINICAL DATA:  81 year old female with with nausea, vomiting and some dizziness. Hyperlipidemia. Subsequent encounter. EXAM: MRI HEAD WITHOUT CONTRAST TECHNIQUE: Multiplanar, multiecho pulse sequences of the brain and surrounding structures were obtained without intravenous contrast. COMPARISON:  04/05/2015 head CT.  11/18/2008 MR. FINDINGS: Exam is motion degraded. Left hippocampal restricted motion with hemorrhage probably represents hemorrhagic infarct rather than herpes encephalitis given the surrounding infarcts as noted below. Clinical correlation recommended. Acute moderate size  nonhemorrhagic infarct posterior right upper and mid pons and adjacent right cerebellum. Acute tiny nonhemorrhagic left thalamic infarct. Tiny acute nonhemorrhagic left occipital lobe infarct. Global atrophy without hydrocephalus. No intracranial mass lesion noted on this unenhanced exam. Left vertebral artery may be occluded. Narrowed irregular right vertebral artery and basilar artery. Internal carotid arteries appear patent. Complex opacification right sphenoid sinus and aerated aspect of the upper right pterygoid plate. Mild mucosal thickening remainder of paranasal sinuses. Elongated globes post lens replacement. Transverse ligament hypertrophy. Cervical spondylotic changes with mild spinal stenosis C3-4 and C4-5. Cervical medullary junction unremarkable. Partially empty expanded sella felt to be an incidental finding. IMPRESSION: Left hippocampal restricted motion with hemorrhage probably represents hemorrhagic infarct rather than herpes encephalitis given the surrounding infarcts as noted below. Clinical correlation recommended. Acute moderate size nonhemorrhagic infarct posterior right upper and mid pons and adjacent right cerebellum. Acute tiny nonhemorrhagic left thalamic infarct. Tiny acute nonhemorrhagic left occipital lobe infarct. Left vertebral artery may be occluded. Narrowed irregular right vertebral artery and basilar artery. Internal carotid arteries appear patent. Complex opacification right sphenoid sinus and aerated aspect of the upper right pterygoid plate. These results will be called to the ordering clinician or representative by the Radiologist Assistant, and communication documented in the PACS or zVision Dashboard. Electronically Signed   By: Genia Del  M.D.   On: 04/06/2015 16:23   Dg Chest Port 1 View  04/23/2015  CLINICAL DATA:  Fever. EXAM: PORTABLE CHEST 1 VIEW COMPARISON:  04/05/2015 chest radiograph FINDINGS: Stable cardiomediastinal silhouette with normal heart size and  mildly tortuous thoracic aorta. No pneumothorax. No pleural effusion. No pulmonary edema. No focal lung consolidation. There is a mild eventration of the left hemidiaphragm. IMPRESSION: No active disease in the chest. Electronically Signed   By: Ilona Sorrel M.D.   On: 04/23/2015 10:15   Dg Chest Port 1 View  04/05/2015  CLINICAL DATA:  Shortness of breath today. EXAM: PORTABLE CHEST 1 VIEW COMPARISON:  December 16, 2014 FINDINGS: The heart size and mediastinal contours are stable. The aorta is tortuous. The heart size is normal. Both lungs are clear. The visualized skeletal structures are stable. IMPRESSION: No active cardiopulmonary disease. Electronically Signed   By: Abelardo Diesel M.D.   On: 04/05/2015 22:19     CBC  Recent Labs Lab 04/23/15 1009 04/24/15 0332 04/25/15 0311  WBC 11.0* 13.8* 11.4*  HGB 13.6 12.5 11.5*  HCT 41.5 38.9 35.6*  PLT 213 250 252  MCV 88.9 89.6 89.4  MCH 29.1 28.8 28.9  MCHC 32.8 32.1 32.3  RDW 14.9 14.9 14.6  LYMPHSABS 1.2  --   --   MONOABS 1.5*  --   --   EOSABS 0.4  --   --   BASOSABS 0.0  --   --     Chemistries   Recent Labs Lab 04/23/15 1009 04/24/15 0332 04/25/15 0311  NA 140 139 144  K 3.7 4.0 3.8  CL 105 106 109  CO2 25 25 26   GLUCOSE 110* 96 100*  BUN 17 17 17   CREATININE 1.40* 1.48* 1.41*  CALCIUM 8.9 8.2* 8.4*  AST 26 22  --   ALT 13* 14  --   ALKPHOS 56 54  --   BILITOT 1.1 1.2  --    ------------------------------------------------------------------------------------------------------------------ estimated creatinine clearance is 24.7 mL/min (by C-G formula based on Cr of 1.41). ------------------------------------------------------------------------------------------------------------------ No results for input(s): HGBA1C in the last 72 hours. ------------------------------------------------------------------------------------------------------------------ No results for input(s): CHOL, HDL, LDLCALC, TRIG, CHOLHDL, LDLDIRECT  in the last 72 hours. ------------------------------------------------------------------------------------------------------------------ No results for input(s): TSH, T4TOTAL, T3FREE, THYROIDAB in the last 72 hours.  Invalid input(s): FREET3 ------------------------------------------------------------------------------------------------------------------ No results for input(s): VITAMINB12, FOLATE, FERRITIN, TIBC, IRON, RETICCTPCT in the last 72 hours.  Coagulation profile  Recent Labs Lab 04/23/15 1856  INR 1.18    No results for input(s): DDIMER in the last 72 hours.  Cardiac Enzymes  Recent Labs Lab 04/23/15 1856  TROPONINI <0.03   ------------------------------------------------------------------------------------------------------------------ Invalid input(s): POCBNP   CBG: No results for input(s): GLUCAP in the last 168 hours.     Studies: No results found.    Lab Results  Component Value Date   HGBA1C 6.0* 04/07/2015   HGBA1C  11/19/2008    5.6 (NOTE) The ADA recommends the following therapeutic goal for glycemic control related to Hgb A1c measurement: Goal of therapy: <6.5 Hgb A1c  Reference: American Diabetes Association: Clinical Practice Recommendations 2010, Diabetes Care, 2010, 33: (Suppl  1).   Lab Results  Component Value Date   LDLCALC 104* 04/07/2015   CREATININE 1.41* 04/25/2015       Scheduled Meds: . antiseptic oral rinse  7 mL Mouth Rinse BID  . aspirin EC  325 mg Oral Daily  . calcium carbonate  1 tablet Oral Q breakfast  . ciprofloxacin  250 mg Oral  Q breakfast  . heparin  5,000 Units Subcutaneous 3 times per day  . hydrocortisone sod succinate (SOLU-CORTEF) inj  50 mg Intravenous Q12H  . mometasone-formoterol  2 puff Inhalation BID  . multivitamin with minerals  1 tablet Oral Daily  . polyethylene glycol  17 g Oral Daily  . pravastatin  20 mg Oral q1800  . vitamin B-6  25 mg Oral Daily  . sodium chloride  3 mL Intravenous  Q12H   Continuous Infusions: . sodium chloride      Active Problems:   Severe persistent asthma in adult steroid dependent   Hyperlipidemia   Diastolic dysfunction   Weakness   Nausea with vomiting   Cerebrovascular accident (CVA) due to embolism ([place])   Ataxia   Acute hemorrhagic infarction of brain ([place])   Sepsis ([place])   Urinary tract infection    Time spent: 45 minutes   [facility] Pager [phone]. If 7PM-7AM, please contact night-coverage at [email or web address], password [facility] 04/25/2015, 11:42 AM  LOS: 2 days

## 2015-04-26 LAB — COMPREHENSIVE METABOLIC PANEL
ALK PHOS: 43 U/L (ref 38–126)
ALT: 15 U/L (ref 14–54)
AST: 23 U/L (ref 15–41)
Albumin: 2.3 g/dL — ABNORMAL LOW (ref 3.5–5.0)
Anion gap: 7 (ref 5–15)
BILIRUBIN TOTAL: 0.3 mg/dL (ref 0.3–1.2)
BUN: 13 mg/dL (ref 6–20)
CALCIUM: 8.2 mg/dL — AB (ref 8.9–10.3)
CO2: 26 mmol/L (ref 22–32)
CREATININE: 1.05 mg/dL — AB (ref 0.44–1.00)
Chloride: 110 mmol/L (ref 101–111)
GFR calc Af Amer: 52 mL/min — ABNORMAL LOW (ref >60)
GFR, EST NON AFRICAN AMERICAN: 45 mL/min — AB (ref >60)
GLUCOSE: 86 mg/dL (ref 65–99)
POTASSIUM: 3.5 mmol/L (ref 3.5–5.1)
SODIUM: 143 mmol/L (ref 135–145)
TOTAL PROTEIN: 5.6 g/dL — AB (ref 6.5–8.1)

## 2015-04-26 LAB — CBC
HEMATOCRIT: 38.8 % (ref 36.0–46.0)
Hemoglobin: 12.3 g/dL (ref 12.0–15.0)
MCH: 28.5 pg (ref 26.0–34.0)
MCHC: 31.7 g/dL (ref 30.0–36.0)
MCV: 89.8 fL (ref 78.0–100.0)
PLATELETS: 284 10*3/uL (ref 150–400)
RBC: 4.32 MIL/uL (ref 3.87–5.11)
RDW: 14.6 % (ref 11.5–15.5)
WBC: 9.8 10*3/uL (ref 4.0–10.5)

## 2015-04-26 LAB — CULTURE, BLOOD (ROUTINE X 2)

## 2015-04-26 MED ORDER — CEFUROXIME AXETIL 500 MG PO TABS
500.0000 mg | ORAL_TABLET | Freq: Two times a day (BID) | ORAL | Status: DC
  Administered 2015-04-26 – 2015-04-27 (×2): 500 mg via ORAL
  Filled 2015-04-26 (×4): qty 1

## 2015-04-26 MED ORDER — HYDROCORTISONE NA SUCCINATE PF 100 MG IJ SOLR
25.0000 mg | Freq: Two times a day (BID) | INTRAMUSCULAR | Status: DC
  Administered 2015-04-26 – 2015-04-27 (×3): 25 mg via INTRAVENOUS
  Filled 2015-04-26 (×3): qty 2

## 2015-04-26 MED ORDER — SODIUM CHLORIDE 0.9 % IV SOLN: INTRAVENOUS | Status: DC

## 2015-04-26 NOTE — Care Management Important Message (Signed)
Important Message  Patient Details  Name: Darlene Murray MRN: [id number] Date of Birth: [birth year]   Medicare Important Message Given:  Yes-second notification given    Nathen May 04/26/2015, 1:41 PM

## 2015-04-26 NOTE — Progress Notes (Signed)
Pharmacist Provided - Patient Medication Education Prior to Discharge   Darlene Murray is an 81 y.o. female who presented to [facility] on 04/23/2015 with a chief complaint of  Chief Complaint  Patient presents with  . Altered Mental Status     [x]  Patient will be discharged with 1 new medications []  Patient being discharged without any new medications  The following medications were discussed with the patient: cefuroxime  Pain Control medications: []  Yes    []  No  Diabetes Medications: []  Yes    []  No  Heart Failure Medications: []  Yes    []  No  Anticoagulation Medications:  []  Yes    []  No  Antibiotics at discharge: [x]  Yes    []  No  Allergy Assessment Completed and Updated: [x]  Yes    []  No Identified Patient Allergies: No Known Allergies   Medication Adherence Assessment: []  Excellent (no doses missed/week)      [x]  Good (1 dose missed/week)      []  Partial (2-3 doses missed/week)      []  Poor (>3 doses missed/week)  Barriers to Obtaining Medications: []  Yes [x]  No  Assessment: Darlene Murray understands to complete her full course of antibiotics once she leaves the hospital.   Time spent preparing for discharge counseling: 10 Time spent counseling patient: [place], PharmD Clinical Pharmacy Resident Pager # [phone] 04/26/2015 6:14 PM

## 2015-04-26 NOTE — Progress Notes (Signed)
Physical Therapy Treatment Patient Details Name: Darlene Murray MRN: [id number] DOB: [birth year] Today's Date: 04/26/2015    History of Present Illness Pt is a 81 y.o. female admitted with UTI and sepsis. She was recently d/c'd from rehab, 04-19-15, following a CVA. PMH consists of blindness, asthma and dyslipidemia.    PT Comments    Patient progressing slowly towards PT goals. Improved ambulation distance today but requires Mod A for balance/safety. 1 LOB towards right side during gait requiring Max A to prevent fall. Pt not functioning at baseline yet. Would benefit from further therapy in acute setting. If family not able to provide Mod A for ambulation/transfers and hands on 24/7 A for OOB then pt may need ST SNF. Will follow acutely per current POC.  Follow Up Recommendations  Home health PT;Supervision/Assistance - 24 hour     Equipment Recommendations  None recommended by PT    Recommendations for Other Services       Precautions / Restrictions Precautions Precautions: Fall Restrictions Weight Bearing Restrictions: No    Mobility  Bed Mobility Overal bed mobility: Needs Assistance Bed Mobility: Supine to Sit     Supine to sit: [facility] elevated;Min assist     General bed mobility comments: Min guard for safety. Min A to scoot bottom to EOB.  Transfers Overall transfer level: Needs assistance Equipment used: Rolling walker (2 wheeled) Transfers: Sit to/from Stand Sit to Stand: Min assist;Mod assist         General transfer comment: Mod A initially to stand from EOB with cues for hand placement progressing to Min A to stand from chair.   Ambulation/Gait Ambulation/Gait assistance: Mod assist Ambulation Distance (Feet): 10 Feet (+ 12') Assistive device: Rolling walker (2 wheeled) Gait Pattern/deviations: Step-to pattern;Step-through pattern;Trunk flexed;Decreased stride length;Staggering right   Gait velocity interpretation: <1.8 ft/sec, indicative of risk for  recurrent falls General Gait Details: Pt veering to right side of RW requiring Min A for RW management. CUes for positioning in RW and for balance. 1 major LOB to the right after removing chair from behind pt's legs resulting in Max A to prevent fall.   Stairs            Wheelchair Mobility    Modified Rankin (Stroke Patients Only)       Balance Overall balance assessment: Needs assistance Sitting-balance support: Feet supported;No upper extremity supported Sitting balance-Leahy Scale: Fair     Standing balance support: During functional activity;Bilateral upper extremity supported Standing balance-Leahy Scale: Poor Standing balance comment: Relient on RW and external support for balance.                     Cognition Arousal/Alertness: Awake/alert Behavior During Therapy: WFL for tasks assessed/performed Overall Cognitive Status: Within Functional Limits for tasks assessed                      Exercises      General Comments        Pertinent Vitals/Pain Pain Assessment: No/denies pain    Home Living                      Prior Function            PT Goals (current goals can now be found in the care plan section) Progress towards PT goals: Progressing toward goals    Frequency  Min 3X/week    PT Plan Current plan remains appropriate    Co-evaluation  End of Session Equipment Utilized During Treatment: Gait belt Activity Tolerance: Patient tolerated treatment well;Patient limited by fatigue Patient left: in chair;with call bell/phone within reach;with family/visitor present (Family member instructed to let RN know when he leaves.)     Time: 1607-3710 PT Time Calculation (min) (ACUTE ONLY): 24 min  Charges:  $Gait Training: 23-37 mins                    G Codes:      Dilyn Smiles A Adrina Armijo 04/26/2015, 2:43 PM Wray [place], [place], DPT [phone]

## 2015-04-26 NOTE — Progress Notes (Signed)
[facility] PROGRESS NOTE  Darlene Murray OEU:[id number] DOB: 09/27/1921 DOA: 04/23/2015 PCP: Gennette Pac, MD  Length of stay: 3   Assessment/Plan: Active Problems:   Severe persistent asthma in adult steroid dependent   Hyperlipidemia   Diastolic dysfunction   Weakness   Nausea with vomiting   Cerebrovascular accident (CVA) due to embolism ([place])   Ataxia   Acute hemorrhagic infarction of brain ([place])   Sepsis ([place])   Urinary tract infection     Sepsis secondary to complicated urinary tract infection:/Bacteremia with Escherichia coli  present on admission.  Found to have Escherichia coli UTI and bacteremia during this admission Initially placed on vancomycin and Zosyn, then switched to ciprofloxacin, based on sensitivity the patient has been changed to Ceftin for another 10-14 days Anticipate discharge tomorrow to home  Hypotension/dehydration Required fluid boluses, stress dose steroids Start tapering stress dose steroids  Acute encephalopathy: Resolved secondary to above /UTI. Less lethargic this morning. CT of the head did not show any acute intracranial abnormality   Nausea with vomiting: acute secondary to above. Resolved -prn Zofran  Abnormal EKG: Sinus tachycardia with ST changes.  -Rechecking EKG stat trending troponins every 6 hours 3  Severe persistent asthma in adult steroid dependent: Stable -Continue patient home inhalers, on chronic steroids Taper stress dose steroids and switch back to prednisone tomorrow  Hyperlipidemia: Stable -Continue patient home statin  Diastolic dysfunction: stable    Weakness: Acutely secondary to UTI. CT scan is negative for any new changes on admission. -Physical therapy occupational therapy recommended home health/SNF/24-hour supervision  Cerebrovascular accident (CVA) due to embolism [facility]) recently the earlier part of October this year. Continue on telemetry -PT OT to eval and treat -Family  wants patient to possibly be fitted for a Holter monitor at discharge. Notified cardiology   AKI on CKD-III: Baseline Cr is 0.96 . Slight improvement in renal function 1.48 > 1.05 discontinuation of ACEI or ARB, diruetics and NSAIDs use.  Continue gentle IV hydration -Follow up renal function by BMP -Avoid ACEI and NSAIDs    DVT prophylaxsis heparin   Code Status:      Code Status Orders        Start     Ordered   04/23/15 1743  Full code   Continuous     04/23/15 1742    Advance Directive Documentation        Most Recent Value   Type of Advance Directive  Living will, Healthcare Power of Attorney   Pre-existing out of facility DNR order (yellow form or pink MOST form)     "MOST" Form in Place?       Family Communication husband/son by the bedside Disposition Plan:  Anticipate discharge tomorrow if renal function improves    Brief narrative: 81 y.o. female with a past medical history of steroid-dependent asthma, dyslipidemia, recent embolic nonhemorrhagic and hemorrhagic stroke; who presents for being acutely altered with symptoms of nausea and vomiting. History is obtained by the patient's son who notes that she is active at baseline able to complete her ADLs even following the stroke. Patient had been at home after being discharged from rehabilitation family noted increased weakness from baseline following the recent stroke.  found to be positive for UTI on admission.   Consultants:  None  Procedures:  None  Antibiotics: Anti-infectives    Start     Dose/Rate Route Frequency Ordered Stop   04/26/15 1700  cefUROXime (CEFTIN) tablet 500 mg  500 mg Oral 2 times daily with meals 04/26/15 1301     04/25/15 1600  ciprofloxacin (CIPRO) tablet 250 mg  Status:  Discontinued     250 mg Oral Daily with breakfast 04/25/15 1053 04/26/15 1301   04/24/15 1100  vancomycin (VANCOCIN) IVPB 750 mg/150 ml premix  Status:  Discontinued     750 mg 150 mL/hr over 60 Minutes  Intravenous Every 24 hours 04/23/15 1517 04/25/15 1031   04/23/15 1745  piperacillin-tazobactam (ZOSYN) IVPB 3.375 g  Status:  Discontinued     3.375 g 100 mL/hr over 30 Minutes Intravenous  Once 04/23/15 1742 04/23/15 1747   04/23/15 1745  vancomycin (VANCOCIN) 1,500 mg in sodium chloride 0.9 % 500 mL IVPB  Status:  Discontinued     1,500 mg 250 mL/hr over 120 Minutes Intravenous  Once 04/23/15 1742 04/23/15 1804   04/23/15 1630  piperacillin-tazobactam (ZOSYN) IVPB 3.375 g  Status:  Discontinued     3.375 g 12.5 mL/hr over 240 Minutes Intravenous Every 8 hours 04/23/15 1517 04/25/15 1031   04/23/15 1000  vancomycin (VANCOCIN) 1,500 mg in sodium chloride 0.9 % 500 mL IVPB     1,500 mg 250 mL/hr over 120 Minutes Intravenous  Once 04/23/15 0947 04/23/15 1330   04/23/15 1000  piperacillin-tazobactam (ZOSYN) IVPB 3.375 g     3.375 g 100 mL/hr over 30 Minutes Intravenous  Once 04/23/15 0947 04/23/15 1132         HPI/Subjective: Hemodynamically stable overnight, continues to feel weak denies any chest pain or shortness of breath  Objective: Filed Vitals:   04/25/15 1545 04/25/15 1548 04/25/15 2145 04/26/15 0526  BP: 133/54  114/58 148/63  Pulse: 63  63 60  Temp: 98.2 F (36.8 C)  98.1 F (36.7 C) 97.8 F (36.6 C)  TempSrc: Oral  Oral Oral  Resp: 18  18 18   Height:  5\' 2"  (1.575 m)    Weight:      SpO2:   99% 100%    Intake/Output Summary (Last 24 hours) at 04/26/15 1302 Last data filed at 04/26/15 9629  Gross per 24 hour  Intake    576 ml  Output    300 ml  Net    276 ml    Exam:  General: No acute respiratory distress Lungs: Clear to auscultation bilaterally without wheezes or crackles Cardiovascular: Regular rate and rhythm without murmur gallop or rub normal S1 and S2 Abdomen: Nontender, nondistended, soft, bowel sounds positive, no rebound, no ascites, no appreciable mass Extremities: No significant cyanosis, clubbing, or edema bilateral lower  extremities     Data Review   Micro Results Recent Results (from the past 240 hour(s))  Blood Culture (routine x 2)     Status: None (Preliminary result)   Collection Time: 04/23/15 10:09 AM  Result Value Ref Range Status   Specimen Description BLOOD RIGHT ARM  Final   Special Requests BOTTLES DRAWN AEROBIC AND ANAEROBIC 5CC  Final   Culture NO GROWTH 2 DAYS  Final   Report Status PENDING  Incomplete  Blood Culture (routine x 2)     Status: None   Collection Time: 04/23/15 10:13 AM  Result Value Ref Range Status   Specimen Description BLOOD RIGHT HAND  Final   Special Requests BOTTLES DRAWN AEROBIC AND ANAEROBIC 5CC  Final   Culture  Setup Time   Final    GRAM NEGATIVE RODS IN BOTH AEROBIC AND ANAEROBIC BOTTLES CRITICAL RESULT CALLED TO, READ BACK BY AND VERIFIED WITH:  B REAP RN [id number] 04/23/15 A BROWNING    Culture ESCHERICHIA COLI  Final   Report Status 04/26/2015 FINAL  Final   Organism ID, Bacteria ESCHERICHIA COLI  Final      Susceptibility   Escherichia coli - MIC*    AMPICILLIN >=32 RESISTANT Resistant     CEFAZOLIN <=4 SENSITIVE Sensitive     CEFEPIME <=1 SENSITIVE Sensitive     CEFTAZIDIME <=1 SENSITIVE Sensitive     CEFTRIAXONE <=1 SENSITIVE Sensitive     CIPROFLOXACIN <=0.25 SENSITIVE Sensitive     GENTAMICIN <=1 SENSITIVE Sensitive     IMIPENEM <=0.25 SENSITIVE Sensitive     TRIMETH/SULFA >=320 RESISTANT Resistant     AMPICILLIN/SULBACTAM 16 INTERMEDIATE Intermediate     PIP/TAZO <=4 SENSITIVE Sensitive     * ESCHERICHIA COLI  Urine culture     Status: None   Collection Time: 04/23/15 11:06 AM  Result Value Ref Range Status   Specimen Description URINE, RANDOM  Final   Special Requests NONE  Final   Culture >=100,000 COLONIES/mL ESCHERICHIA COLI  Final   Report Status 04/25/2015 FINAL  Final   Organism ID, Bacteria ESCHERICHIA COLI  Final      Susceptibility   Escherichia coli - MIC*    AMPICILLIN >=32 RESISTANT Resistant     CEFAZOLIN <=4 SENSITIVE  Sensitive     CEFTRIAXONE <=1 SENSITIVE Sensitive     CIPROFLOXACIN <=0.25 SENSITIVE Sensitive     GENTAMICIN <=1 SENSITIVE Sensitive     IMIPENEM <=0.25 SENSITIVE Sensitive     NITROFURANTOIN <=16 SENSITIVE Sensitive     TRIMETH/SULFA >=320 RESISTANT Resistant     AMPICILLIN/SULBACTAM >=32 RESISTANT Resistant     PIP/TAZO <=4 SENSITIVE Sensitive     * >=100,000 COLONIES/mL ESCHERICHIA COLI  MRSA PCR Screening     Status: None   Collection Time: 04/23/15  7:07 PM  Result Value Ref Range Status   MRSA by PCR NEGATIVE NEGATIVE Final    Comment:        The GeneXpert MRSA Assay (FDA approved for NASAL specimens only), is one component of a comprehensive MRSA colonization surveillance program. It is not intended to diagnose MRSA infection nor to guide or monitor treatment for MRSA infections.     Radiology Reports Ct Angio Head W/cm &/or Wo Cm  04/07/2015  CLINICAL DATA:  81 year old female with acute intracranial infarcts. Presented with nausea, vomiting and dizziness. Subsequent encounter. EXAM: CT ANGIOGRAPHY HEAD AND NECK TECHNIQUE: Multidetector CT imaging of the head and neck was performed using the standard protocol during bolus administration of intravenous contrast. Multiplanar CT image reconstructions and MIPs were obtained to evaluate the vascular anatomy. Carotid stenosis measurements (when applicable) are obtained utilizing NASCET criteria, using the distal internal carotid diameter as the denominator. CONTRAST:  35mL OMNIPAQUE IOHEXOL 350 MG/ML SOLN COMPARISON:  04/06/2015 brain MR. FINDINGS: CT HEAD Brain: Left thalamic, left hippocampus and right pontine/cerebellar infarct. No intracranial hemorrhage. Global atrophy without hydrocephalus. No intracranial enhancing lesion. Calvarium and skull base: Negative. Paranasal sinuses: Opacification sphenoid sinuses greater on the right. Orbits: Post lens replacement. CTA NECK Aortic arch: Ectatic 3 vessel aortic arch with plaque. Right  carotid system: Ectatic common carotid artery. Plaque right carotid bifurcation with 52% diameter stenosis proximal right internal carotid artery. Ectatic cervical segment. Left carotid system: Ectatic common carotid artery. No significant stenosis left carotid bifurcation or proximal left internal carotid artery. Ectatic cervical segment. Vertebral arteries:Plaque with mild narrowing proximal right vertebral artery. Mild narrowing proximal left vertebral artery.  Moderate narrowing left vertebral artery C3-4 level. Skeleton: Cervical spondylotic changes most notable C4-5 thru C5-6. Other neck: No worrisome primary neck mass or lung apical lesion. CTA HEAD Anterior circulation: Internal carotid artery cavernous segment calcification with mild narrowing bilaterally. Anterior circulation without medium or large size vessel significant stenosis or occlusion. Posterior circulation: Right vertebral artery is dominant and mildly ectatic. Narrowing distal left vertebral artery. Ectatic basilar artery with mild narrowing but without high-grade stenosis. No significant stenosis of the proximal posterior cerebral arteries. Fetal type contribution on right. Narrowing distal aspect of the posterior cerebral arteries bilaterally. Venous sinuses: Negative Anatomic variants: Negative Delayed phase: Negative IMPRESSION: CT HEAD Left thalamic, left hippocampus and right pontine/cerebellar infarct. Opacification sphenoid sinuses greater on the right. CTA NECK Ectatic 3 vessel aortic arch with plaque. Plaque right carotid bifurcation with 52% diameter stenosis proximal right internal carotid artery. No significant stenosis left carotid bifurcation or proximal left internal carotid artery. Plaque with mild narrowing proximal right vertebral artery. Mild narrowing proximal left vertebral artery. Moderate narrowing left vertebral artery C3-4 level. CTA HEAD Internal carotid artery cavernous segment calcification with mild narrowing  bilaterally. Anterior circulation without medium or large size vessel significant stenosis or occlusion. Right vertebral artery is dominant and mildly ectatic. Narrowing distal left vertebral artery. Ectatic basilar artery with mild narrowing but without high-grade stenosis. No significant stenosis of the proximal posterior cerebral arteries. Fetal type contribution on right. Narrowing distal aspect of the posterior cerebral arteries bilaterally. Electronically Signed   By: Genia Del M.D.   On: 04/07/2015 15:36   Ct Head Wo Contrast  04/23/2015  CLINICAL DATA:  Altered mental status. Decreased level of consciousness. Lethargy. EXAM: CT HEAD WITHOUT CONTRAST TECHNIQUE: Contiguous axial images were obtained from the base of the skull through the vertex without intravenous contrast. COMPARISON:  04/05/2015 FINDINGS: There is no evidence of intracranial hemorrhage, brain edema, or other signs of acute infarction. There is no evidence of intracranial mass lesion or mass effect. No abnormal extraaxial fluid collections are identified. Mild diffuse cerebral atrophy remains stable. Ventricles are stable in size. No skull abnormality identified. Partial opacification of ethmoid and sphenoid sinuses again demonstrated. IMPRESSION: No acute intracranial abnormality. Stable mild cerebral atrophy and paranasal sinus disease. Electronically Signed   By: Earle Gell M.D.   On: 04/23/2015 10:52   Ct Head Wo Contrast  04/05/2015  CLINICAL DATA:  Lethargy, vomiting and nausea for 12 hr. EXAM: CT HEAD WITHOUT CONTRAST TECHNIQUE: Contiguous axial images were obtained from the base of the skull through the vertex without intravenous contrast. COMPARISON:  Head CT 06/28/2014 FINDINGS: No intracranial hemorrhage, mass effect, or midline shift. No hydrocephalus. The basilar cisterns are patent. No evidence of territorial infarct. No intracranial fluid collection. Generalized atrophy, stable in degree from prior exam. Calvarium  is intact. Chronic opacification of right side of sphenoid sinus with minimal retained secretions and left sphenoid sinus. Mucosal thickening throughout the ethmoid air cells, mildly progressed. Mastoid air cells are well aerated. IMPRESSION: 1.  No acute intracranial abnormality.  Stable atrophy. 2. Paranasal sinus inflammatory change, progressed from prior. Electronically Signed   By: Jeb Levering M.D.   On: 04/05/2015 22:46   Ct Angio Neck W/cm &/or Wo/cm  04/07/2015  CLINICAL DATA:  81 year old female with acute intracranial infarcts. Presented with nausea, vomiting and dizziness. Subsequent encounter. EXAM: CT ANGIOGRAPHY HEAD AND NECK TECHNIQUE: Multidetector CT imaging of the head and neck was performed using the standard protocol during bolus administration of intravenous  contrast. Multiplanar CT image reconstructions and MIPs were obtained to evaluate the vascular anatomy. Carotid stenosis measurements (when applicable) are obtained utilizing NASCET criteria, using the distal internal carotid diameter as the denominator. CONTRAST:  74mL OMNIPAQUE IOHEXOL 350 MG/ML SOLN COMPARISON:  04/06/2015 brain MR. FINDINGS: CT HEAD Brain: Left thalamic, left hippocampus and right pontine/cerebellar infarct. No intracranial hemorrhage. Global atrophy without hydrocephalus. No intracranial enhancing lesion. Calvarium and skull base: Negative. Paranasal sinuses: Opacification sphenoid sinuses greater on the right. Orbits: Post lens replacement. CTA NECK Aortic arch: Ectatic 3 vessel aortic arch with plaque. Right carotid system: Ectatic common carotid artery. Plaque right carotid bifurcation with 52% diameter stenosis proximal right internal carotid artery. Ectatic cervical segment. Left carotid system: Ectatic common carotid artery. No significant stenosis left carotid bifurcation or proximal left internal carotid artery. Ectatic cervical segment. Vertebral arteries:Plaque with mild narrowing proximal right  vertebral artery. Mild narrowing proximal left vertebral artery. Moderate narrowing left vertebral artery C3-4 level. Skeleton: Cervical spondylotic changes most notable C4-5 thru C5-6. Other neck: No worrisome primary neck mass or lung apical lesion. CTA HEAD Anterior circulation: Internal carotid artery cavernous segment calcification with mild narrowing bilaterally. Anterior circulation without medium or large size vessel significant stenosis or occlusion. Posterior circulation: Right vertebral artery is dominant and mildly ectatic. Narrowing distal left vertebral artery. Ectatic basilar artery with mild narrowing but without high-grade stenosis. No significant stenosis of the proximal posterior cerebral arteries. Fetal type contribution on right. Narrowing distal aspect of the posterior cerebral arteries bilaterally. Venous sinuses: Negative Anatomic variants: Negative Delayed phase: Negative IMPRESSION: CT HEAD Left thalamic, left hippocampus and right pontine/cerebellar infarct. Opacification sphenoid sinuses greater on the right. CTA NECK Ectatic 3 vessel aortic arch with plaque. Plaque right carotid bifurcation with 52% diameter stenosis proximal right internal carotid artery. No significant stenosis left carotid bifurcation or proximal left internal carotid artery. Plaque with mild narrowing proximal right vertebral artery. Mild narrowing proximal left vertebral artery. Moderate narrowing left vertebral artery C3-4 level. CTA HEAD Internal carotid artery cavernous segment calcification with mild narrowing bilaterally. Anterior circulation without medium or large size vessel significant stenosis or occlusion. Right vertebral artery is dominant and mildly ectatic. Narrowing distal left vertebral artery. Ectatic basilar artery with mild narrowing but without high-grade stenosis. No significant stenosis of the proximal posterior cerebral arteries. Fetal type contribution on right. Narrowing distal aspect of the  posterior cerebral arteries bilaterally. Electronically Signed   By: Genia Del M.D.   On: 04/07/2015 15:36   Mr Brain Wo Contrast  04/06/2015  CLINICAL DATA:  81 year old female with with nausea, vomiting and some dizziness. Hyperlipidemia. Subsequent encounter. EXAM: MRI HEAD WITHOUT CONTRAST TECHNIQUE: Multiplanar, multiecho pulse sequences of the brain and surrounding structures were obtained without intravenous contrast. COMPARISON:  04/05/2015 head CT.  11/18/2008 MR. FINDINGS: Exam is motion degraded. Left hippocampal restricted motion with hemorrhage probably represents hemorrhagic infarct rather than herpes encephalitis given the surrounding infarcts as noted below. Clinical correlation recommended. Acute moderate size nonhemorrhagic infarct posterior right upper and mid pons and adjacent right cerebellum. Acute tiny nonhemorrhagic left thalamic infarct. Tiny acute nonhemorrhagic left occipital lobe infarct. Global atrophy without hydrocephalus. No intracranial mass lesion noted on this unenhanced exam. Left vertebral artery may be occluded. Narrowed irregular right vertebral artery and basilar artery. Internal carotid arteries appear patent. Complex opacification right sphenoid sinus and aerated aspect of the upper right pterygoid plate. Mild mucosal thickening remainder of paranasal sinuses. Elongated globes post lens replacement. Transverse ligament hypertrophy. Cervical spondylotic  changes with mild spinal stenosis C3-4 and C4-5. Cervical medullary junction unremarkable. Partially empty expanded sella felt to be an incidental finding. IMPRESSION: Left hippocampal restricted motion with hemorrhage probably represents hemorrhagic infarct rather than herpes encephalitis given the surrounding infarcts as noted below. Clinical correlation recommended. Acute moderate size nonhemorrhagic infarct posterior right upper and mid pons and adjacent right cerebellum. Acute tiny nonhemorrhagic left thalamic  infarct. Tiny acute nonhemorrhagic left occipital lobe infarct. Left vertebral artery may be occluded. Narrowed irregular right vertebral artery and basilar artery. Internal carotid arteries appear patent. Complex opacification right sphenoid sinus and aerated aspect of the upper right pterygoid plate. These results will be called to the ordering clinician or representative by the Radiologist Assistant, and communication documented in the PACS or zVision Dashboard. Electronically Signed   By: Genia Del M.D.   On: 04/06/2015 16:23   Dg Chest Port 1 View  04/23/2015  CLINICAL DATA:  Fever. EXAM: PORTABLE CHEST 1 VIEW COMPARISON:  04/05/2015 chest radiograph FINDINGS: Stable cardiomediastinal silhouette with normal heart size and mildly tortuous thoracic aorta. No pneumothorax. No pleural effusion. No pulmonary edema. No focal lung consolidation. There is a mild eventration of the left hemidiaphragm. IMPRESSION: No active disease in the chest. Electronically Signed   By: Ilona Sorrel M.D.   On: 04/23/2015 10:15   Dg Chest Port 1 View  04/05/2015  CLINICAL DATA:  Shortness of breath today. EXAM: PORTABLE CHEST 1 VIEW COMPARISON:  December 16, 2014 FINDINGS: The heart size and mediastinal contours are stable. The aorta is tortuous. The heart size is normal. Both lungs are clear. The visualized skeletal structures are stable. IMPRESSION: No active cardiopulmonary disease. Electronically Signed   By: Abelardo Diesel M.D.   On: 04/05/2015 22:19     CBC  Recent Labs Lab 04/23/15 1009 04/24/15 0332 04/25/15 0311 04/26/15 0620  WBC 11.0* 13.8* 11.4* 9.8  HGB 13.6 12.5 11.5* 12.3  HCT 41.5 38.9 35.6* 38.8  PLT 213 250 252 284  MCV 88.9 89.6 89.4 89.8  MCH 29.1 28.8 28.9 28.5  MCHC 32.8 32.1 32.3 31.7  RDW 14.9 14.9 14.6 14.6  LYMPHSABS 1.2  --   --   --   MONOABS 1.5*  --   --   --   EOSABS 0.4  --   --   --   BASOSABS 0.0  --   --   --     Chemistries   Recent Labs Lab 04/23/15 1009  04/24/15 0332 04/25/15 0311 04/26/15 0620  NA 140 139 144 143  K 3.7 4.0 3.8 3.5  CL 105 106 109 110  CO2 25 25 26 26   GLUCOSE 110* 96 100* 86  BUN 17 17 17 13   CREATININE 1.40* 1.48* 1.41* 1.05*  CALCIUM 8.9 8.2* 8.4* 8.2*  AST 26 22  --  23  ALT 13* 14  --  15  ALKPHOS 56 54  --  43  BILITOT 1.1 1.2  --  0.3   ------------------------------------------------------------------------------------------------------------------ estimated creatinine clearance is 33.2 mL/min (by C-G formula based on Cr of 1.05). ------------------------------------------------------------------------------------------------------------------ No results for input(s): HGBA1C in the last 72 hours. ------------------------------------------------------------------------------------------------------------------ No results for input(s): CHOL, HDL, LDLCALC, TRIG, CHOLHDL, LDLDIRECT in the last 72 hours. ------------------------------------------------------------------------------------------------------------------ No results for input(s): TSH, T4TOTAL, T3FREE, THYROIDAB in the last 72 hours.  Invalid input(s): FREET3 ------------------------------------------------------------------------------------------------------------------ No results for input(s): VITAMINB12, FOLATE, FERRITIN, TIBC, IRON, RETICCTPCT in the last 72 hours.  Coagulation profile  Recent Labs Lab 04/23/15 1856  INR  1.18    No results for input(s): DDIMER in the last 72 hours.  Cardiac Enzymes  Recent Labs Lab 04/23/15 1856  TROPONINI <0.03   ------------------------------------------------------------------------------------------------------------------ Invalid input(s): POCBNP   CBG: No results for input(s): GLUCAP in the last 168 hours.     Studies: No results found.    Lab Results  Component Value Date   HGBA1C 6.0* 04/07/2015   HGBA1C  11/19/2008    5.6 (NOTE) The ADA recommends the following  therapeutic goal for glycemic control related to Hgb A1c measurement: Goal of therapy: <6.5 Hgb A1c  Reference: American Diabetes Association: Clinical Practice Recommendations 2010, Diabetes Care, 2010, 33: (Suppl  1).   Lab Results  Component Value Date   LDLCALC 104* 04/07/2015   CREATININE 1.05* 04/26/2015       Scheduled Meds: . antiseptic oral rinse  7 mL Mouth Rinse BID  . aspirin EC  325 mg Oral Daily  . calcium carbonate  1 tablet Oral Q breakfast  . cefUROXime  500 mg Oral BID WC  . heparin  5,000 Units Subcutaneous 3 times per day  . hydrocortisone sod succinate (SOLU-CORTEF) inj  50 mg Intravenous Q12H  . mometasone-formoterol  2 puff Inhalation BID  . multivitamin with minerals  1 tablet Oral Daily  . polyethylene glycol  17 g Oral Daily  . pravastatin  20 mg Oral q1800  . vitamin B-6  25 mg Oral Daily  . sodium chloride  3 mL Intravenous Q12H   Continuous Infusions:    Active Problems:   Severe persistent asthma in adult steroid dependent   Hyperlipidemia   Diastolic dysfunction   Weakness   Nausea with vomiting   Cerebrovascular accident (CVA) due to embolism ([place])   Ataxia   Acute hemorrhagic infarction of brain ([place])   Sepsis ([place])   Urinary tract infection    Time spent: 45 minutes   [facility] Pager [phone]. If 7PM-7AM, please contact night-coverage at [email or web address], password [facility] 04/26/2015, 1:02 PM  LOS: 3 days

## 2015-04-26 NOTE — Care Management Note (Addendum)
Case Management Note  Patient Details  Name: Darlene Murray MRN: [id number] Date of Birth: 03-28-1922  Subjective/Objective:                  Date-04-26-15 Initial Assessment Spoke with patient at the bedside along with husband, Evangelyne Loja [phone].  Introduced self as Tourist information centre manager and explained role in discharge planning and how to be reached.  Verified patient lives at home with spouse.  Verified patient anticipates to go home with spouse,  at time of discharge and will have full- time supervision by spouse at this time to best of their knowledge.  Patient has DME walker. Expressed potential need for no other DME.  Patient denied  needing help with their medication.  Patient  is driven by husband to MD appointments.  Verified patient has PCP. Patient states they currently receive [place] services through [facility].  Patient was recently discharged and set up with [facility], resumption order for [facility] services placed by CM.  Plan: CM will continue to follow for discharge planning and [facility] resources.   Carles Collet RN BSN CM [phone]   Action/Plan:  Referral made to [facility] to resume services.   Expected Discharge Date:                  Expected Discharge Plan:  [place]  In-House Referral:     Discharge planning Services  CM Consult  Post Acute Care Choice:  Resumption of Svcs/PTA Provider Choice offered to:  Patient, Spouse  DME Arranged:    DME Agency:     HH Arranged:  PT, RN [place] Agency:  [place]  Status of Service:  Completed, signed off  Medicare Important Message Given:    Date Medicare IM Given:    Medicare IM give by:    Date Additional Medicare IM Given:    Additional Medicare Important Message give by:     If discussed at [place] of Stay Meetings, dates discussed:    Additional Comments:  Carles Collet, RN 04/26/2015, 11:30 AM

## 2015-04-27 DIAGNOSIS — I638 Other cerebral infarction: Secondary | ICD-10-CM

## 2015-04-27 LAB — BASIC METABOLIC PANEL
Anion gap: 9 (ref 5–15)
BUN: 9 mg/dL (ref 6–20)
CALCIUM: 8.3 mg/dL — AB (ref 8.9–10.3)
CO2: 22 mmol/L (ref 22–32)
Chloride: 113 mmol/L — ABNORMAL HIGH (ref 101–111)
Creatinine, Ser: 0.88 mg/dL (ref 0.44–1.00)
GFR calc Af Amer: >60 mL/min (ref >60)
GFR calc non Af Amer: 56 mL/min — ABNORMAL LOW (ref >60)
Glucose, Bld: 86 mg/dL (ref 65–99)
POTASSIUM: 3.3 mmol/L — AB (ref 3.5–5.1)
Sodium: 144 mmol/L (ref 135–145)

## 2015-04-27 MED ORDER — CIPROFLOXACIN HCL 500 MG PO TABS: 500.0000 mg | ORAL_TABLET | Freq: Two times a day (BID) | ORAL | Status: DC

## 2015-04-27 MED ORDER — CEFUROXIME AXETIL 500 MG PO TABS: 500.0000 mg | ORAL_TABLET | Freq: Two times a day (BID) | ORAL | Status: DC

## 2015-04-27 MED ORDER — POTASSIUM CHLORIDE CRYS ER 20 MEQ PO TBCR
40.0000 meq | EXTENDED_RELEASE_TABLET | Freq: Once | ORAL | Status: AC
  Administered 2015-04-27: 40 meq via ORAL
  Filled 2015-04-27: qty 2

## 2015-04-27 MED ORDER — SACCHAROMYCES BOULARDII 250 MG PO CAPS: 250.0000 mg | ORAL_CAPSULE | Freq: Two times a day (BID) | ORAL | Status: DC

## 2015-04-27 NOTE — Discharge Summary (Addendum)
Darlene Murray, is a 81 y.o. female  DOB [birth year]  MRN [id number].  Admission date:  04/23/2015  Admitting Physician  Norval Morton, MD  Discharge Date:  04/27/2015   Primary MD  Gennette Pac, MD  Recommendations for primary care physician for things to follow:   Check CBC, BMP and magnesium next visit within 5-7 days   Admission Diagnosis  stroke,lsn last night   Discharge Diagnosis  UTI with sepsis  Active Problems:   Severe persistent asthma in adult steroid dependent   Hyperlipidemia   Diastolic dysfunction   Weakness   Nausea with vomiting   Cerebrovascular accident (CVA) due to embolism ([place])   Ataxia   Acute hemorrhagic infarction of brain ([place])   Sepsis ([place])   Urinary tract infection      Past Medical History  Diagnosis Date  . Blindness   . Asthmatic bronchitis   . Asthma   . Fibrocystic breast disease   . Allergic rhinitis   . Hyperlipidemia   . Venous insufficiency     lower extremities  . HNP (herniated nucleus pulposus), lumbar     L4-L5  . Left ventricular hypertrophy 2008    mild  . Antibiotic-associated diarrhea     History reviewed. No pertinent past surgical history.     HPI  from the history and physical done on the day of admission:    81 y.o. female with a past medical history of steroid-dependent asthma, dyslipidemia, recent embolic nonhemorrhagic and hemorrhagic stroke; who presents for being acutely altered with symptoms of nausea and vomiting. History is obtained by the patient's son who notes that she is active at baseline able to complete her ADLs even following the stroke. Patient had been at home after being discharged from rehabilitation family noted increased weakness from baseline following the recent stroke.  found to be positive for UTI on  admission.     Hospital Course:     Sepsis secondary to complicated urinary tract infection:/Bacteremia with Escherichia coli present on admission. Was treated with IV Rocephin for 4 days in the hospital thereafter will be placed on 8 days of oral Cipro. Has currently deferred if arrest and back to baseline. Afebrile, mentation back to normal. Discussed her case with family bedside who agree that she is back to her baseline. Will be discharged home with home PT. Request PCP to recheck CBC BMP and magnesium level next visit within 5-7 days.   Hypotension/dehydration Zoloft IV fluids and some stress dose steroids which have been tapered off. Back to baseline.  Acute encephalopathy: Resolved secondary to above /UTI. Salt and back to baseline. CT of the head did not show any acute intracranial abnormality  Nausea with vomiting: acute secondary to above. Resolved -Patient resolved  Abnormal EKG: Sinus tachycardia with ST changes.  -Rechecking EKG stat trending troponins every 6 hours 3  Severe persistent asthma in adult steroid dependent: Stable -Tapered IV steroids back to her usual oral steroids at home   Hyperlipidemia: Stable -Continue patient home statin  Diastolic dysfunction: stable    Weakness: Acutely secondary to UTI. CT scan is negative for any new changes on admission. -Physical therapy occupational therapy recommended home health/SNF/24-hour supervision  Cerebrovascular accident (CVA) due to embolism [facility]) recently the earlier part of October this year. Continue on telemetry -PT OT to eval and treat -Family wants patient to possibly be fitted for a Holter monitor at discharge. Notified cardiology   AKI on CKD-III: Baseline Cr is 0.96 . Slight improvement in renal function 1.48 > 1.05 discontinuation of ACEI or ARB, diruetics and NSAIDs use.  As resolved with hydration.   Hypokalemia. Potassium replaced.   Discharge Condition: Stable  Follow  UP  Follow-up Information    Follow up with Gennette Pac, MD. Schedule an appointment as soon as possible for a visit on 05/04/2015.   Specialty:  Family Medicine   Why:   On 05/04/15 @ 2:15pm   Contact information:   [place] [id number] [phone]        Consults obtained - none  Diet and Activity recommendation: See Discharge Instructions below  Discharge Instructions           Discharge Instructions    Diet - low sodium heart healthy    Complete by:  As directed      Discharge instructions    Complete by:  As directed   Follow with Primary MD Gennette Pac, MD in 7 days   Get CBC, CMP, 2 view Chest X ray checked  by Primary MD next visit.    Activity: As tolerated with Full fall precautions use walker/cane & assistance as needed   Disposition Home     Diet: Heart Healthy    For Heart failure patients - Check your Weight same time everyday, if you gain over 2 pounds, or you develop in leg swelling, experience more shortness of breath or chest pain, call your Primary MD immediately. Follow Cardiac Low Salt Diet and 1.5 lit/day fluid restriction.   On your next visit with your primary care physician please Get Medicines reviewed and adjusted.   Please request your Prim.MD to go over all Hospital Tests and Procedure/Radiological results at the follow up, please get all Hospital records sent to your Prim MD by signing hospital release before you go home.   If you experience worsening of your admission symptoms, develop shortness of breath, life threatening emergency, suicidal or homicidal thoughts you must seek medical attention immediately by calling 911 or calling your MD immediately  if symptoms less severe.  You Must read complete instructions/literature along with all the possible adverse reactions/side effects for all the Medicines you take and that have been prescribed to you. Take any new Medicines after you have completely  understood and accpet all the possible adverse reactions/side effects.   Do not drive, operating heavy machinery, perform activities at heights, swimming or participation in water activities or provide baby sitting services if your were admitted for syncope or siezures until you have seen by Primary MD or a Neurologist and advised to do so again.  Do not drive when taking Pain medications.    Do not take more than prescribed Pain, Sleep and Anxiety Medications  Special Instructions: If you have smoked or chewed Tobacco  in the last 2 yrs please stop smoking, stop any regular Alcohol  and or any Recreational drug use.  Wear Seat belts while driving.   Please note  You were cared for by a hospitalist during your hospital stay.  If you have any questions about your discharge medications or the care you received while you were in the hospital after you are discharged, you can call the unit and asked to speak with the hospitalist on call if the hospitalist that took care of you is not available. Once you are discharged, your primary care physician will handle any further medical issues. Please note that NO REFILLS for any discharge medications will be authorized once you are discharged, as it is imperative that you return to your primary care physician (or establish a relationship with a primary care physician if you do not have one) for your aftercare needs so that they can reassess your need for medications and monitor your lab values.     Increase activity slowly    Complete by:  As directed              Discharge Medications       Medication List    TAKE these medications        albuterol 108 (90 BASE) MCG/ACT inhaler  Commonly known as:  PROVENTIL HFA;VENTOLIN HFA  Inhale 2 puffs into the lungs every 6 (six) hours as needed for wheezing or shortness of breath.     aspirin 325 MG EC tablet  Take 1 tablet (325 mg total) by mouth daily.     bethanechol 10 MG tablet  Commonly  known as:  URECHOLINE  1 tablet 3 times daily 3 days then 2 tablets twice daily 3 days and stop     calcium carbonate 600 MG Tabs tablet  Commonly known as:  OS-CAL  Take 600 mg by mouth daily.     cefUROXime 500 MG tablet  Commonly known as:  CEFTIN  Take 1 tablet (500 mg total) by mouth 2 (two) times daily with a meal.     Fluticasone Furoate-Vilanterol 200-25 MCG/INH Aepb  Commonly known as:  BREO ELLIPTA  Inhale 1 puff into the lungs daily.     mometasone-formoterol 100-5 MCG/ACT Aero  Commonly known as:  DULERA  Inhale 2 puffs into the lungs 2 (two) times daily.     multivitamin tablet  Take 1 tablet by mouth daily.     polyethylene glycol packet  Commonly known as:  MIRALAX / GLYCOLAX  Take 17 g by mouth daily.     pravastatin 20 MG tablet  Commonly known as:  PRAVACHOL  Take 1 tablet (20 mg total) by mouth daily at 6 PM.     predniSONE 5 MG tablet  Commonly known as:  DELTASONE  Take 1 tablet (5 mg total) by mouth daily.     saccharomyces boulardii 250 MG capsule  Commonly known as:  FLORASTOR  Take 1 capsule (250 mg total) by mouth 2 (two) times daily.     VITAMIN B-6 PO  Take 1 tablet by mouth daily.        Major procedures and Radiology Reports - PLEASE review detailed and final reports for all details, in brief -       Ct Angio Head W/cm &/or Wo Cm  04/07/2015  CLINICAL DATA:  81 year old female with acute intracranial infarcts. Presented with nausea, vomiting and dizziness. Subsequent encounter. EXAM: CT ANGIOGRAPHY HEAD AND NECK TECHNIQUE: Multidetector CT imaging of the head and neck was performed using the standard protocol during bolus administration of intravenous contrast. Multiplanar CT image reconstructions and MIPs were obtained to evaluate the vascular anatomy. Carotid stenosis measurements (when applicable) are obtained utilizing NASCET criteria, using the distal  internal carotid diameter as the denominator. CONTRAST:  29mL OMNIPAQUE IOHEXOL  350 MG/ML SOLN COMPARISON:  04/06/2015 brain MR. FINDINGS: CT HEAD Brain: Left thalamic, left hippocampus and right pontine/cerebellar infarct. No intracranial hemorrhage. Global atrophy without hydrocephalus. No intracranial enhancing lesion. Calvarium and skull base: Negative. Paranasal sinuses: Opacification sphenoid sinuses greater on the right. Orbits: Post lens replacement. CTA NECK Aortic arch: Ectatic 3 vessel aortic arch with plaque. Right carotid system: Ectatic common carotid artery. Plaque right carotid bifurcation with 52% diameter stenosis proximal right internal carotid artery. Ectatic cervical segment. Left carotid system: Ectatic common carotid artery. No significant stenosis left carotid bifurcation or proximal left internal carotid artery. Ectatic cervical segment. Vertebral arteries:Plaque with mild narrowing proximal right vertebral artery. Mild narrowing proximal left vertebral artery. Moderate narrowing left vertebral artery C3-4 level. Skeleton: Cervical spondylotic changes most notable C4-5 thru C5-6. Other neck: No worrisome primary neck mass or lung apical lesion. CTA HEAD Anterior circulation: Internal carotid artery cavernous segment calcification with mild narrowing bilaterally. Anterior circulation without medium or large size vessel significant stenosis or occlusion. Posterior circulation: Right vertebral artery is dominant and mildly ectatic. Narrowing distal left vertebral artery. Ectatic basilar artery with mild narrowing but without high-grade stenosis. No significant stenosis of the proximal posterior cerebral arteries. Fetal type contribution on right. Narrowing distal aspect of the posterior cerebral arteries bilaterally. Venous sinuses: Negative Anatomic variants: Negative Delayed phase: Negative IMPRESSION: CT HEAD Left thalamic, left hippocampus and right pontine/cerebellar infarct. Opacification sphenoid sinuses greater on the right. CTA NECK Ectatic 3 vessel aortic arch  with plaque. Plaque right carotid bifurcation with 52% diameter stenosis proximal right internal carotid artery. No significant stenosis left carotid bifurcation or proximal left internal carotid artery. Plaque with mild narrowing proximal right vertebral artery. Mild narrowing proximal left vertebral artery. Moderate narrowing left vertebral artery C3-4 level. CTA HEAD Internal carotid artery cavernous segment calcification with mild narrowing bilaterally. Anterior circulation without medium or large size vessel significant stenosis or occlusion. Right vertebral artery is dominant and mildly ectatic. Narrowing distal left vertebral artery. Ectatic basilar artery with mild narrowing but without high-grade stenosis. No significant stenosis of the proximal posterior cerebral arteries. Fetal type contribution on right. Narrowing distal aspect of the posterior cerebral arteries bilaterally. Electronically Signed   By: Genia Del M.D.   On: 04/07/2015 15:36   Ct Head Wo Contrast  04/23/2015  CLINICAL DATA:  Altered mental status. Decreased level of consciousness. Lethargy. EXAM: CT HEAD WITHOUT CONTRAST TECHNIQUE: Contiguous axial images were obtained from the base of the skull through the vertex without intravenous contrast. COMPARISON:  04/05/2015 FINDINGS: There is no evidence of intracranial hemorrhage, brain edema, or other signs of acute infarction. There is no evidence of intracranial mass lesion or mass effect. No abnormal extraaxial fluid collections are identified. Mild diffuse cerebral atrophy remains stable. Ventricles are stable in size. No skull abnormality identified. Partial opacification of ethmoid and sphenoid sinuses again demonstrated. IMPRESSION: No acute intracranial abnormality. Stable mild cerebral atrophy and paranasal sinus disease. Electronically Signed   By: Earle Gell M.D.   On: 04/23/2015 10:52   Ct Head Wo Contrast  04/05/2015  CLINICAL DATA:  Lethargy, vomiting and nausea for 12  hr. EXAM: CT HEAD WITHOUT CONTRAST TECHNIQUE: Contiguous axial images were obtained from the base of the skull through the vertex without intravenous contrast. COMPARISON:  Head CT 06/28/2014 FINDINGS: No intracranial hemorrhage, mass effect, or midline shift. No hydrocephalus. The basilar cisterns are patent. No evidence of territorial infarct.  No intracranial fluid collection. Generalized atrophy, stable in degree from prior exam. Calvarium is intact. Chronic opacification of right side of sphenoid sinus with minimal retained secretions and left sphenoid sinus. Mucosal thickening throughout the ethmoid air cells, mildly progressed. Mastoid air cells are well aerated. IMPRESSION: 1.  No acute intracranial abnormality.  Stable atrophy. 2. Paranasal sinus inflammatory change, progressed from prior. Electronically Signed   By: Jeb Levering M.D.   On: 04/05/2015 22:46   Ct Angio Neck W/cm &/or Wo/cm  04/07/2015  CLINICAL DATA:  81 year old female with acute intracranial infarcts. Presented with nausea, vomiting and dizziness. Subsequent encounter. EXAM: CT ANGIOGRAPHY HEAD AND NECK TECHNIQUE: Multidetector CT imaging of the head and neck was performed using the standard protocol during bolus administration of intravenous contrast. Multiplanar CT image reconstructions and MIPs were obtained to evaluate the vascular anatomy. Carotid stenosis measurements (when applicable) are obtained utilizing NASCET criteria, using the distal internal carotid diameter as the denominator. CONTRAST:  87mL OMNIPAQUE IOHEXOL 350 MG/ML SOLN COMPARISON:  04/06/2015 brain MR. FINDINGS: CT HEAD Brain: Left thalamic, left hippocampus and right pontine/cerebellar infarct. No intracranial hemorrhage. Global atrophy without hydrocephalus. No intracranial enhancing lesion. Calvarium and skull base: Negative. Paranasal sinuses: Opacification sphenoid sinuses greater on the right. Orbits: Post lens replacement. CTA NECK Aortic arch: Ectatic 3  vessel aortic arch with plaque. Right carotid system: Ectatic common carotid artery. Plaque right carotid bifurcation with 52% diameter stenosis proximal right internal carotid artery. Ectatic cervical segment. Left carotid system: Ectatic common carotid artery. No significant stenosis left carotid bifurcation or proximal left internal carotid artery. Ectatic cervical segment. Vertebral arteries:Plaque with mild narrowing proximal right vertebral artery. Mild narrowing proximal left vertebral artery. Moderate narrowing left vertebral artery C3-4 level. Skeleton: Cervical spondylotic changes most notable C4-5 thru C5-6. Other neck: No worrisome primary neck mass or lung apical lesion. CTA HEAD Anterior circulation: Internal carotid artery cavernous segment calcification with mild narrowing bilaterally. Anterior circulation without medium or large size vessel significant stenosis or occlusion. Posterior circulation: Right vertebral artery is dominant and mildly ectatic. Narrowing distal left vertebral artery. Ectatic basilar artery with mild narrowing but without high-grade stenosis. No significant stenosis of the proximal posterior cerebral arteries. Fetal type contribution on right. Narrowing distal aspect of the posterior cerebral arteries bilaterally. Venous sinuses: Negative Anatomic variants: Negative Delayed phase: Negative IMPRESSION: CT HEAD Left thalamic, left hippocampus and right pontine/cerebellar infarct. Opacification sphenoid sinuses greater on the right. CTA NECK Ectatic 3 vessel aortic arch with plaque. Plaque right carotid bifurcation with 52% diameter stenosis proximal right internal carotid artery. No significant stenosis left carotid bifurcation or proximal left internal carotid artery. Plaque with mild narrowing proximal right vertebral artery. Mild narrowing proximal left vertebral artery. Moderate narrowing left vertebral artery C3-4 level. CTA HEAD Internal carotid artery cavernous segment  calcification with mild narrowing bilaterally. Anterior circulation without medium or large size vessel significant stenosis or occlusion. Right vertebral artery is dominant and mildly ectatic. Narrowing distal left vertebral artery. Ectatic basilar artery with mild narrowing but without high-grade stenosis. No significant stenosis of the proximal posterior cerebral arteries. Fetal type contribution on right. Narrowing distal aspect of the posterior cerebral arteries bilaterally. Electronically Signed   By: Genia Del M.D.   On: 04/07/2015 15:36   Mr Brain Wo Contrast  04/06/2015  CLINICAL DATA:  81 year old female with with nausea, vomiting and some dizziness. Hyperlipidemia. Subsequent encounter. EXAM: MRI HEAD WITHOUT CONTRAST TECHNIQUE: Multiplanar, multiecho pulse sequences of the brain and surrounding structures were  obtained without intravenous contrast. COMPARISON:  04/05/2015 head CT.  11/18/2008 MR. FINDINGS: Exam is motion degraded. Left hippocampal restricted motion with hemorrhage probably represents hemorrhagic infarct rather than herpes encephalitis given the surrounding infarcts as noted below. Clinical correlation recommended. Acute moderate size nonhemorrhagic infarct posterior right upper and mid pons and adjacent right cerebellum. Acute tiny nonhemorrhagic left thalamic infarct. Tiny acute nonhemorrhagic left occipital lobe infarct. Global atrophy without hydrocephalus. No intracranial mass lesion noted on this unenhanced exam. Left vertebral artery may be occluded. Narrowed irregular right vertebral artery and basilar artery. Internal carotid arteries appear patent. Complex opacification right sphenoid sinus and aerated aspect of the upper right pterygoid plate. Mild mucosal thickening remainder of paranasal sinuses. Elongated globes post lens replacement. Transverse ligament hypertrophy. Cervical spondylotic changes with mild spinal stenosis C3-4 and C4-5. Cervical medullary junction  unremarkable. Partially empty expanded sella felt to be an incidental finding. IMPRESSION: Left hippocampal restricted motion with hemorrhage probably represents hemorrhagic infarct rather than herpes encephalitis given the surrounding infarcts as noted below. Clinical correlation recommended. Acute moderate size nonhemorrhagic infarct posterior right upper and mid pons and adjacent right cerebellum. Acute tiny nonhemorrhagic left thalamic infarct. Tiny acute nonhemorrhagic left occipital lobe infarct. Left vertebral artery may be occluded. Narrowed irregular right vertebral artery and basilar artery. Internal carotid arteries appear patent. Complex opacification right sphenoid sinus and aerated aspect of the upper right pterygoid plate. These results will be called to the ordering clinician or representative by the Radiologist Assistant, and communication documented in the PACS or zVision Dashboard. Electronically Signed   By: Genia Del M.D.   On: 04/06/2015 16:23   Dg Chest Port 1 View  04/23/2015  CLINICAL DATA:  Fever. EXAM: PORTABLE CHEST 1 VIEW COMPARISON:  04/05/2015 chest radiograph FINDINGS: Stable cardiomediastinal silhouette with normal heart size and mildly tortuous thoracic aorta. No pneumothorax. No pleural effusion. No pulmonary edema. No focal lung consolidation. There is a mild eventration of the left hemidiaphragm. IMPRESSION: No active disease in the chest. Electronically Signed   By: Ilona Sorrel M.D.   On: 04/23/2015 10:15   Dg Chest Port 1 View  04/05/2015  CLINICAL DATA:  Shortness of breath today. EXAM: PORTABLE CHEST 1 VIEW COMPARISON:  December 16, 2014 FINDINGS: The heart size and mediastinal contours are stable. The aorta is tortuous. The heart size is normal. Both lungs are clear. The visualized skeletal structures are stable. IMPRESSION: No active cardiopulmonary disease. Electronically Signed   By: Abelardo Diesel M.D.   On: 04/05/2015 22:19    Micro Results      Recent  Results (from the past 240 hour(s))  Blood Culture (routine x 2)     Status: None (Preliminary result)   Collection Time: 04/23/15 10:09 AM  Result Value Ref Range Status   Specimen Description BLOOD RIGHT ARM  Final   Special Requests BOTTLES DRAWN AEROBIC AND ANAEROBIC 5CC  Final   Culture NO GROWTH 3 DAYS  Final   Report Status PENDING  Incomplete  Blood Culture (routine x 2)     Status: None   Collection Time: 04/23/15 10:13 AM  Result Value Ref Range Status   Specimen Description BLOOD RIGHT HAND  Final   Special Requests BOTTLES DRAWN AEROBIC AND ANAEROBIC 5CC  Final   Culture  Setup Time   Final    GRAM NEGATIVE RODS IN BOTH AEROBIC AND ANAEROBIC BOTTLES CRITICAL RESULT CALLED TO, READ BACK BY AND VERIFIED WITH: B REAP RN [id number] 04/23/15 A BROWNING  Culture ESCHERICHIA COLI  Final   Report Status 04/26/2015 FINAL  Final   Organism ID, Bacteria ESCHERICHIA COLI  Final      Susceptibility   Escherichia coli - MIC*    AMPICILLIN >=32 RESISTANT Resistant     CEFAZOLIN <=4 SENSITIVE Sensitive     CEFEPIME <=1 SENSITIVE Sensitive     CEFTAZIDIME <=1 SENSITIVE Sensitive     CEFTRIAXONE <=1 SENSITIVE Sensitive     CIPROFLOXACIN <=0.25 SENSITIVE Sensitive     GENTAMICIN <=1 SENSITIVE Sensitive     IMIPENEM <=0.25 SENSITIVE Sensitive     TRIMETH/SULFA >=320 RESISTANT Resistant     AMPICILLIN/SULBACTAM 16 INTERMEDIATE Intermediate     PIP/TAZO <=4 SENSITIVE Sensitive     * ESCHERICHIA COLI  Urine culture     Status: None   Collection Time: 04/23/15 11:06 AM  Result Value Ref Range Status   Specimen Description URINE, RANDOM  Final   Special Requests NONE  Final   Culture >=100,000 COLONIES/mL ESCHERICHIA COLI  Final   Report Status 04/25/2015 FINAL  Final   Organism ID, Bacteria ESCHERICHIA COLI  Final      Susceptibility   Escherichia coli - MIC*    AMPICILLIN >=32 RESISTANT Resistant     CEFAZOLIN <=4 SENSITIVE Sensitive     CEFTRIAXONE <=1 SENSITIVE Sensitive      CIPROFLOXACIN <=0.25 SENSITIVE Sensitive     GENTAMICIN <=1 SENSITIVE Sensitive     IMIPENEM <=0.25 SENSITIVE Sensitive     NITROFURANTOIN <=16 SENSITIVE Sensitive     TRIMETH/SULFA >=320 RESISTANT Resistant     AMPICILLIN/SULBACTAM >=32 RESISTANT Resistant     PIP/TAZO <=4 SENSITIVE Sensitive     * >=100,000 COLONIES/mL ESCHERICHIA COLI  MRSA PCR Screening     Status: None   Collection Time: 04/23/15  7:07 PM  Result Value Ref Range Status   MRSA by PCR NEGATIVE NEGATIVE Final    Comment:        The GeneXpert MRSA Assay (FDA approved for NASAL specimens only), is one component of a comprehensive MRSA colonization surveillance program. It is not intended to diagnose MRSA infection nor to guide or monitor treatment for MRSA infections.        Today   Subjective    Darlene Murray today has no headache,no chest abdominal pain,no new weakness tingling or numbness, feels much better wants to go home today.     Objective   Blood pressure 155/64, pulse 60, temperature 98 F (36.7 C), temperature source Oral, resp. rate 18, height 5\' 2"  (1.575 m), weight 75.297 kg (166 lb), SpO2 98 %.   Intake/Output Summary (Last 24 hours) at 04/27/15 1143 Last data filed at 04/27/15 1012  Gross per 24 hour  Intake  472.5 ml  Output   1600 ml  Net -1127.5 ml    Exam Awake Alert, Oriented x 3, No new F.N deficits, Normal affect [place].AT,PERRAL Supple Neck,No JVD, No cervical lymphadenopathy appriciated.  Symmetrical Chest wall movement, Good air movement bilaterally, CTAB RRR,No Gallops,Rubs or new Murmurs, No Parasternal Heave +ve B.Sounds, Abd Soft, Non tender, No organomegaly appriciated, No rebound -guarding or rigidity. No Cyanosis, Clubbing or edema, No new Rash or bruise   Data Review   CBC w Diff:  Lab Results  Component Value Date   WBC 9.8 04/26/2015   HGB 12.3 04/26/2015   HCT 38.8 04/26/2015   PLT 284 04/26/2015   LYMPHOPCT 10 04/23/2015   MONOPCT 13 04/23/2015    EOSPCT 4 04/23/2015   BASOPCT  0 04/23/2015    CMP:  Lab Results  Component Value Date   NA 144 04/27/2015   K 3.3* 04/27/2015   CL 113* 04/27/2015   CO2 22 04/27/2015   BUN 9 04/27/2015   CREATININE 0.88 04/27/2015   PROT 5.6* 04/26/2015   ALBUMIN 2.3* 04/26/2015   BILITOT 0.3 04/26/2015   ALKPHOS 43 04/26/2015   AST 23 04/26/2015   ALT 15 04/26/2015  .   Total Time in preparing paper work, data evaluation and todays exam - 4 minutes  GHIMIRE,SHANKER M.D on 04/27/2015 at 11:43 AM  [facility]   Office  [phone]

## 2015-04-27 NOTE — Care Management Note (Signed)
Case Management Note  Patient Details  Name: Darlene Murray MRN: [id number] Date of Birth: Nov 27, 1921  Subjective/Objective:      Patient is for dc to home today, Miranda with [facility] notified.                Action/Plan:   Expected Discharge Date:                  Expected Discharge Plan:  [place]  In-House Referral:     Discharge planning Services  CM Consult  Post Acute Care Choice:  Resumption of Svcs/PTA Provider Choice offered to:  Patient, Spouse  DME Arranged:    DME Agency:     HH Arranged:  PT, RN, OT, Nurse's Aide [place] Agency:  [place]  Status of Service:  Completed, signed off  Medicare Important Message Given:  Yes-second notification given Date Medicare IM Given:    Medicare IM give by:    Date Additional Medicare IM Given:    Additional Medicare Important Message give by:     If discussed at [place] of Stay Meetings, dates discussed:    Additional Comments:  Zenon Mayo, RN 04/27/2015, 11:51 AM

## 2015-04-27 NOTE — Progress Notes (Signed)
D/c instructions review with pt and her husband. Copy of instructions and scripts given to pt's husband. Waiting for staff NT to assist pt out in wheelchair, pt will need assist of staff to help get her into car.

## 2015-04-27 NOTE — Progress Notes (Signed)
Pt d/c'd via wheelchair with belongings with family, escorted by unit NT.   

## 2015-04-27 NOTE — Discharge Instructions (Signed)
Follow with Primary MD Gennette Pac, MD in 7 days   Get CBC, CMP, 2 view Chest X ray checked  by Primary MD next visit.    Activity: As tolerated with Full fall precautions use walker/cane & assistance as needed   Disposition Home     Diet: Heart Healthy    For Heart failure patients - Check your Weight same time everyday, if you gain over 2 pounds, or you develop in leg swelling, experience more shortness of breath or chest pain, call your Primary MD immediately. Follow Cardiac Low Salt Diet and 1.5 lit/day fluid restriction.   On your next visit with your primary care physician please Get Medicines reviewed and adjusted.   Please request your Prim.MD to go over all Hospital Tests and Procedure/Radiological results at the follow up, please get all Hospital records sent to your Prim MD by signing hospital release before you go home.   If you experience worsening of your admission symptoms, develop shortness of breath, life threatening emergency, suicidal or homicidal thoughts you must seek medical attention immediately by calling 911 or calling your MD immediately  if symptoms less severe.  You Must read complete instructions/literature along with all the possible adverse reactions/side effects for all the Medicines you take and that have been prescribed to you. Take any new Medicines after you have completely understood and accpet all the possible adverse reactions/side effects.   Do not drive, operating heavy machinery, perform activities at heights, swimming or participation in water activities or provide baby sitting services if your were admitted for syncope or siezures until you have seen by Primary MD or a Neurologist and advised to do so again.  Do not drive when taking Pain medications.    Do not take more than prescribed Pain, Sleep and Anxiety Medications  Special Instructions: If you have smoked or chewed Tobacco  in the last 2 yrs please stop smoking, stop any  regular Alcohol  and or any Recreational drug use.  Wear Seat belts while driving.   Please note  You were cared for by a hospitalist during your hospital stay. If you have any questions about your discharge medications or the care you received while you were in the hospital after you are discharged, you can call the unit and asked to speak with the hospitalist on call if the hospitalist that took care of you is not available. Once you are discharged, your primary care physician will handle any further medical issues. Please note that NO REFILLS for any discharge medications will be authorized once you are discharged, as it is imperative that you return to your primary care physician (or establish a relationship with a primary care physician if you do not have one) for your aftercare needs so that they can reassess your need for medications and monitor your lab values.

## 2015-04-28 DIAGNOSIS — E785 Hyperlipidemia, unspecified: Secondary | ICD-10-CM | POA: Diagnosis not present

## 2015-04-28 DIAGNOSIS — N390 Urinary tract infection, site not specified: Secondary | ICD-10-CM | POA: Diagnosis not present

## 2015-04-28 DIAGNOSIS — J45909 Unspecified asthma, uncomplicated: Secondary | ICD-10-CM | POA: Diagnosis not present

## 2015-04-28 DIAGNOSIS — I69351 Hemiplegia and hemiparesis following cerebral infarction affecting right dominant side: Secondary | ICD-10-CM | POA: Diagnosis not present

## 2015-04-28 DIAGNOSIS — B9620 Unspecified Escherichia coli [E. coli] as the cause of diseases classified elsewhere: Secondary | ICD-10-CM | POA: Diagnosis not present

## 2015-04-28 LAB — URINE CULTURE: CULTURE: NO GROWTH

## 2015-04-28 LAB — CULTURE, BLOOD (ROUTINE X 2): CULTURE: NO GROWTH

## 2015-04-30 DIAGNOSIS — N390 Urinary tract infection, site not specified: Secondary | ICD-10-CM | POA: Diagnosis not present

## 2015-04-30 DIAGNOSIS — I69351 Hemiplegia and hemiparesis following cerebral infarction affecting right dominant side: Secondary | ICD-10-CM | POA: Diagnosis not present

## 2015-04-30 DIAGNOSIS — J45909 Unspecified asthma, uncomplicated: Secondary | ICD-10-CM | POA: Diagnosis not present

## 2015-04-30 DIAGNOSIS — B9620 Unspecified Escherichia coli [E. coli] as the cause of diseases classified elsewhere: Secondary | ICD-10-CM | POA: Diagnosis not present

## 2015-04-30 DIAGNOSIS — E785 Hyperlipidemia, unspecified: Secondary | ICD-10-CM | POA: Diagnosis not present

## 2015-05-02 DIAGNOSIS — I69351 Hemiplegia and hemiparesis following cerebral infarction affecting right dominant side: Secondary | ICD-10-CM | POA: Diagnosis not present

## 2015-05-02 DIAGNOSIS — E785 Hyperlipidemia, unspecified: Secondary | ICD-10-CM | POA: Diagnosis not present

## 2015-05-02 DIAGNOSIS — B9620 Unspecified Escherichia coli [E. coli] as the cause of diseases classified elsewhere: Secondary | ICD-10-CM | POA: Diagnosis not present

## 2015-05-02 DIAGNOSIS — J45909 Unspecified asthma, uncomplicated: Secondary | ICD-10-CM | POA: Diagnosis not present

## 2015-05-02 DIAGNOSIS — N390 Urinary tract infection, site not specified: Secondary | ICD-10-CM | POA: Diagnosis not present

## 2015-05-03 DIAGNOSIS — J45909 Unspecified asthma, uncomplicated: Secondary | ICD-10-CM | POA: Diagnosis not present

## 2015-05-03 DIAGNOSIS — I69351 Hemiplegia and hemiparesis following cerebral infarction affecting right dominant side: Secondary | ICD-10-CM | POA: Diagnosis not present

## 2015-05-03 DIAGNOSIS — B9620 Unspecified Escherichia coli [E. coli] as the cause of diseases classified elsewhere: Secondary | ICD-10-CM | POA: Diagnosis not present

## 2015-05-03 DIAGNOSIS — E785 Hyperlipidemia, unspecified: Secondary | ICD-10-CM | POA: Diagnosis not present

## 2015-05-03 DIAGNOSIS — N390 Urinary tract infection, site not specified: Secondary | ICD-10-CM | POA: Diagnosis not present

## 2015-05-04 DIAGNOSIS — E785 Hyperlipidemia, unspecified: Secondary | ICD-10-CM | POA: Diagnosis not present

## 2015-05-04 DIAGNOSIS — Z79899 Other long term (current) drug therapy: Secondary | ICD-10-CM | POA: Diagnosis not present

## 2015-05-04 DIAGNOSIS — N390 Urinary tract infection, site not specified: Secondary | ICD-10-CM | POA: Diagnosis not present

## 2015-05-04 DIAGNOSIS — B9620 Unspecified Escherichia coli [E. coli] as the cause of diseases classified elsewhere: Secondary | ICD-10-CM | POA: Diagnosis not present

## 2015-05-04 DIAGNOSIS — J45909 Unspecified asthma, uncomplicated: Secondary | ICD-10-CM | POA: Diagnosis not present

## 2015-05-04 DIAGNOSIS — E876 Hypokalemia: Secondary | ICD-10-CM | POA: Diagnosis not present

## 2015-05-04 DIAGNOSIS — R609 Edema, unspecified: Secondary | ICD-10-CM | POA: Diagnosis not present

## 2015-05-04 DIAGNOSIS — I69351 Hemiplegia and hemiparesis following cerebral infarction affecting right dominant side: Secondary | ICD-10-CM | POA: Diagnosis not present

## 2015-05-07 DIAGNOSIS — J45909 Unspecified asthma, uncomplicated: Secondary | ICD-10-CM | POA: Diagnosis not present

## 2015-05-07 DIAGNOSIS — I69351 Hemiplegia and hemiparesis following cerebral infarction affecting right dominant side: Secondary | ICD-10-CM | POA: Diagnosis not present

## 2015-05-07 DIAGNOSIS — E785 Hyperlipidemia, unspecified: Secondary | ICD-10-CM | POA: Diagnosis not present

## 2015-05-07 DIAGNOSIS — N390 Urinary tract infection, site not specified: Secondary | ICD-10-CM | POA: Diagnosis not present

## 2015-05-07 DIAGNOSIS — B9620 Unspecified Escherichia coli [E. coli] as the cause of diseases classified elsewhere: Secondary | ICD-10-CM | POA: Diagnosis not present

## 2015-05-08 DIAGNOSIS — B9620 Unspecified Escherichia coli [E. coli] as the cause of diseases classified elsewhere: Secondary | ICD-10-CM | POA: Diagnosis not present

## 2015-05-08 DIAGNOSIS — I69351 Hemiplegia and hemiparesis following cerebral infarction affecting right dominant side: Secondary | ICD-10-CM | POA: Diagnosis not present

## 2015-05-08 DIAGNOSIS — E785 Hyperlipidemia, unspecified: Secondary | ICD-10-CM | POA: Diagnosis not present

## 2015-05-08 DIAGNOSIS — N390 Urinary tract infection, site not specified: Secondary | ICD-10-CM | POA: Diagnosis not present

## 2015-05-08 DIAGNOSIS — J45909 Unspecified asthma, uncomplicated: Secondary | ICD-10-CM | POA: Diagnosis not present

## 2015-05-09 DIAGNOSIS — N390 Urinary tract infection, site not specified: Secondary | ICD-10-CM | POA: Diagnosis not present

## 2015-05-09 DIAGNOSIS — Z79899 Other long term (current) drug therapy: Secondary | ICD-10-CM | POA: Diagnosis not present

## 2015-05-09 DIAGNOSIS — E876 Hypokalemia: Secondary | ICD-10-CM | POA: Diagnosis not present

## 2015-05-09 DIAGNOSIS — B9620 Unspecified Escherichia coli [E. coli] as the cause of diseases classified elsewhere: Secondary | ICD-10-CM | POA: Diagnosis not present

## 2015-05-09 DIAGNOSIS — E785 Hyperlipidemia, unspecified: Secondary | ICD-10-CM | POA: Diagnosis not present

## 2015-05-09 DIAGNOSIS — I69351 Hemiplegia and hemiparesis following cerebral infarction affecting right dominant side: Secondary | ICD-10-CM | POA: Diagnosis not present

## 2015-05-09 DIAGNOSIS — J45909 Unspecified asthma, uncomplicated: Secondary | ICD-10-CM | POA: Diagnosis not present

## 2015-05-10 DIAGNOSIS — J45909 Unspecified asthma, uncomplicated: Secondary | ICD-10-CM | POA: Diagnosis not present

## 2015-05-10 DIAGNOSIS — E785 Hyperlipidemia, unspecified: Secondary | ICD-10-CM | POA: Diagnosis not present

## 2015-05-10 DIAGNOSIS — I69351 Hemiplegia and hemiparesis following cerebral infarction affecting right dominant side: Secondary | ICD-10-CM | POA: Diagnosis not present

## 2015-05-10 DIAGNOSIS — B9620 Unspecified Escherichia coli [E. coli] as the cause of diseases classified elsewhere: Secondary | ICD-10-CM | POA: Diagnosis not present

## 2015-05-10 DIAGNOSIS — N390 Urinary tract infection, site not specified: Secondary | ICD-10-CM | POA: Diagnosis not present

## 2015-05-11 DIAGNOSIS — J45909 Unspecified asthma, uncomplicated: Secondary | ICD-10-CM | POA: Diagnosis not present

## 2015-05-11 DIAGNOSIS — I69351 Hemiplegia and hemiparesis following cerebral infarction affecting right dominant side: Secondary | ICD-10-CM | POA: Diagnosis not present

## 2015-05-11 DIAGNOSIS — B9620 Unspecified Escherichia coli [E. coli] as the cause of diseases classified elsewhere: Secondary | ICD-10-CM | POA: Diagnosis not present

## 2015-05-11 DIAGNOSIS — E785 Hyperlipidemia, unspecified: Secondary | ICD-10-CM | POA: Diagnosis not present

## 2015-05-11 DIAGNOSIS — N390 Urinary tract infection, site not specified: Secondary | ICD-10-CM | POA: Diagnosis not present

## 2015-05-14 DIAGNOSIS — N390 Urinary tract infection, site not specified: Secondary | ICD-10-CM | POA: Diagnosis not present

## 2015-05-14 DIAGNOSIS — J45909 Unspecified asthma, uncomplicated: Secondary | ICD-10-CM | POA: Diagnosis not present

## 2015-05-14 DIAGNOSIS — B9620 Unspecified Escherichia coli [E. coli] as the cause of diseases classified elsewhere: Secondary | ICD-10-CM | POA: Diagnosis not present

## 2015-05-14 DIAGNOSIS — E785 Hyperlipidemia, unspecified: Secondary | ICD-10-CM | POA: Diagnosis not present

## 2015-05-14 DIAGNOSIS — I69351 Hemiplegia and hemiparesis following cerebral infarction affecting right dominant side: Secondary | ICD-10-CM | POA: Diagnosis not present

## 2015-05-15 DIAGNOSIS — B9620 Unspecified Escherichia coli [E. coli] as the cause of diseases classified elsewhere: Secondary | ICD-10-CM | POA: Diagnosis not present

## 2015-05-15 DIAGNOSIS — I69351 Hemiplegia and hemiparesis following cerebral infarction affecting right dominant side: Secondary | ICD-10-CM | POA: Diagnosis not present

## 2015-05-15 DIAGNOSIS — J45909 Unspecified asthma, uncomplicated: Secondary | ICD-10-CM | POA: Diagnosis not present

## 2015-05-15 DIAGNOSIS — N390 Urinary tract infection, site not specified: Secondary | ICD-10-CM | POA: Diagnosis not present

## 2015-05-15 DIAGNOSIS — E785 Hyperlipidemia, unspecified: Secondary | ICD-10-CM

## 2015-05-16 DIAGNOSIS — J45909 Unspecified asthma, uncomplicated: Secondary | ICD-10-CM | POA: Diagnosis not present

## 2015-05-16 DIAGNOSIS — N390 Urinary tract infection, site not specified: Secondary | ICD-10-CM | POA: Diagnosis not present

## 2015-05-16 DIAGNOSIS — B9620 Unspecified Escherichia coli [E. coli] as the cause of diseases classified elsewhere: Secondary | ICD-10-CM | POA: Diagnosis not present

## 2015-05-16 DIAGNOSIS — E785 Hyperlipidemia, unspecified: Secondary | ICD-10-CM | POA: Diagnosis not present

## 2015-05-16 DIAGNOSIS — I69351 Hemiplegia and hemiparesis following cerebral infarction affecting right dominant side: Secondary | ICD-10-CM | POA: Diagnosis not present

## 2015-05-17 ENCOUNTER — Ambulatory Visit (HOSPITAL_BASED_OUTPATIENT_CLINIC_OR_DEPARTMENT_OTHER): Payer: Medicare Other | Admitting: Physical Medicine & Rehabilitation

## 2015-05-17 ENCOUNTER — Encounter: Payer: Self-pay | Admitting: Physical Medicine & Rehabilitation

## 2015-05-17 ENCOUNTER — Encounter: Payer: Medicare Other | Attending: Physical Medicine & Rehabilitation

## 2015-05-17 VITALS — BP 126/66 | HR 74 | Resp 14

## 2015-05-17 DIAGNOSIS — I872 Venous insufficiency (chronic) (peripheral): Secondary | ICD-10-CM | POA: Diagnosis not present

## 2015-05-17 DIAGNOSIS — I639 Cerebral infarction, unspecified: Secondary | ICD-10-CM

## 2015-05-17 DIAGNOSIS — I69398 Other sequelae of cerebral infarction: Secondary | ICD-10-CM | POA: Insufficient documentation

## 2015-05-17 DIAGNOSIS — R270 Ataxia, unspecified: Secondary | ICD-10-CM

## 2015-05-17 DIAGNOSIS — E785 Hyperlipidemia, unspecified: Secondary | ICD-10-CM | POA: Diagnosis not present

## 2015-05-17 DIAGNOSIS — M5126 Other intervertebral disc displacement, lumbar region: Secondary | ICD-10-CM | POA: Diagnosis not present

## 2015-05-17 DIAGNOSIS — R269 Unspecified abnormalities of gait and mobility: Secondary | ICD-10-CM

## 2015-05-17 DIAGNOSIS — I69993 Ataxia following unspecified cerebrovascular disease: Secondary | ICD-10-CM

## 2015-05-17 DIAGNOSIS — I517 Cardiomegaly: Secondary | ICD-10-CM | POA: Diagnosis not present

## 2015-05-17 DIAGNOSIS — J45909 Unspecified asthma, uncomplicated: Secondary | ICD-10-CM | POA: Diagnosis not present

## 2015-05-17 NOTE — Progress Notes (Signed)
Subjective:    Patient ID: Darlene Murray, female    DOB: 10-19-1921, 81 y.o.   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 She went through inpatient rehabilitation at [facility]. She was discharged at a supervision level for ADLs and mobility. She had dysmetria on the right side on discharge from the hospital. She returned home on the 14th however she developed UTI and was hospitalized from 1017 through 1021 at [facility]. She was treated with IV antibiotics. Repeat CAT scan of the brain at that time did not show any new strokes. Patient is back at home she is receiving home health PT and is just starting home health OT. She requires more assistance than previously. She has followed up with her primary care physician who has not changed any of her medications are reviewed Her past medical history is significant for back pain radiating into the left leg. She used to receive epidural injections for this but has not required these since her stroke. She does not have as much pain going down into the left leg as she used to. History of right eye reduced vision.  Pain Inventory Average Pain 0 Pain Right Now 0 My pain is no pain  In the last 24 hours, has pain interfered with the following? General activity 0 Relation with others 0 Enjoyment of life 0 What TIME of day is your pain at its worst? no pain Sleep (in general) Good  Pain is worse with: no pain Pain improves with: no pain Relief from Meds: no pain  Mobility walk with assistance use a walker how many minutes can you walk? 5 ability to climb steps?  no do you drive?  no use a wheelchair needs help with transfers  Function retired I need assistance with the following:  dressing, bathing and toileting  Neuro/Psych weakness trouble walking  Prior Studies hospital f/u  Physicians involved in your  care hospital f/u   Family History  Problem Relation Age of Onset  . Heart attack Sister   . Alzheimer's disease Sister   . Leukemia Mother   . Asthma Son    Social History   Social History  . Marital Status: Married    Spouse Name: N/A  . Number of Children: 3  . Years of Education: N/A   Occupational History  . Retired     Pharmacist, hospital   Social History Main Topics  . Smoking status: Never Smoker   . Smokeless tobacco: Never Used  . Alcohol Use: Yes     Comment: wine occasionally  . Drug Use: No  . Sexual Activity:    Partners: Female   Other Topics Concern  . None   Social History Narrative   History reviewed. No pertinent past surgical history. Past Medical History  Diagnosis Date  . Blindness   . Asthmatic bronchitis   . Asthma   . Fibrocystic breast disease   . Allergic rhinitis   . Hyperlipidemia   . Venous insufficiency     lower extremities  . HNP (herniated nucleus pulposus), lumbar     L4-L5  . Left ventricular hypertrophy 2008    mild  . Antibiotic-associated diarrhea    BP 126/66 mmHg  Pulse 74  Resp 14  SpO2 95%  Opioid Risk Score:   Fall Risk Score:  `1  Depression screen PHQ 2/9  Depression screen [facility] 2/9 05/17/2015  Decreased Interest  0  Down, Depressed, Hopeless 0  PHQ - 2 Score 0  Altered sleeping 0  Tired, decreased energy 0  Change in appetite 0  Feeling bad or failure about yourself  0  Trouble concentrating 0  Moving slowly or fidgety/restless 0  Suicidal thoughts 0  PHQ-9 Score 0  Difficult doing work/chores Not difficult at all     Review of Systems  Musculoskeletal: Positive for gait problem.  Neurological: Positive for weakness.       Objective:   Physical Exam  Constitutional: She is oriented to person, place, and time. She appears well-developed and well-nourished.  HENT:  Head: Normocephalic and atraumatic.  Eyes: Conjunctivae and EOM are normal. Pupils are equal, round, and reactive to light.   Cardiovascular: Normal rate, regular rhythm and normal heart sounds.   Pulmonary/Chest: Effort normal and breath sounds normal.  Abdominal: Soft. Bowel sounds are normal.  Neurological: She is alert and oriented to person, place, and time. She displays tremor. No sensory deficit. She exhibits normal muscle tone. Gait abnormal.  Moderate Dysmetria right finger-nose-finger, bilateral heel-to-shin Minimal dysmetria on left finger-nose-finger Motor strength is 4/5 bilateral biceps triceps grip 3 minus at the right deltoid due to reduced range of motion, 4/5 left deltoid, 4/5 bilateral hip flexor and knee extensor and flexor Intention tremor right upper greater than left upper  Requires min assist from sit to stand  Psychiatric: She has a normal mood and affect.  Nursing note and vitals reviewed.         Assessment & Plan:   1. History of pontocerebellar infarct, had a UTI requiring readmission to the hospital from 04/23/2015 through 04/27/2015. She still has not regained her previous functional status as compared with 04/20/2015 discharge from rehabilitation Recommend neurology follow-up given that any setback from a UTI should have cleared by now. Continue home health PT OT Physical medicine and rehabilitation follow-up in 1 month possible outpatient PT OT needed at that time. Discussed my findings and recommendations with the patient as well as her husband.

## 2015-05-17 NOTE — Patient Instructions (Signed)
Please make appointment with neurology  Make appointment to see me in 4 weeks  Continue home health until you see me back, then you may need to get some orders for outpatient therapy

## 2015-05-18 DIAGNOSIS — I69351 Hemiplegia and hemiparesis following cerebral infarction affecting right dominant side: Secondary | ICD-10-CM | POA: Diagnosis not present

## 2015-05-18 DIAGNOSIS — B9620 Unspecified Escherichia coli [E. coli] as the cause of diseases classified elsewhere: Secondary | ICD-10-CM | POA: Diagnosis not present

## 2015-05-18 DIAGNOSIS — N390 Urinary tract infection, site not specified: Secondary | ICD-10-CM | POA: Diagnosis not present

## 2015-05-18 DIAGNOSIS — J45909 Unspecified asthma, uncomplicated: Secondary | ICD-10-CM | POA: Diagnosis not present

## 2015-05-18 DIAGNOSIS — E785 Hyperlipidemia, unspecified: Secondary | ICD-10-CM | POA: Diagnosis not present

## 2015-05-19 DIAGNOSIS — B9620 Unspecified Escherichia coli [E. coli] as the cause of diseases classified elsewhere: Secondary | ICD-10-CM | POA: Diagnosis not present

## 2015-05-19 DIAGNOSIS — J45909 Unspecified asthma, uncomplicated: Secondary | ICD-10-CM | POA: Diagnosis not present

## 2015-05-19 DIAGNOSIS — E785 Hyperlipidemia, unspecified: Secondary | ICD-10-CM | POA: Diagnosis not present

## 2015-05-19 DIAGNOSIS — N390 Urinary tract infection, site not specified: Secondary | ICD-10-CM | POA: Diagnosis not present

## 2015-05-19 DIAGNOSIS — I69351 Hemiplegia and hemiparesis following cerebral infarction affecting right dominant side: Secondary | ICD-10-CM | POA: Diagnosis not present

## 2015-05-21 ENCOUNTER — Telehealth: Payer: Self-pay | Admitting: Neurology

## 2015-05-21 DIAGNOSIS — B9620 Unspecified Escherichia coli [E. coli] as the cause of diseases classified elsewhere: Secondary | ICD-10-CM | POA: Diagnosis not present

## 2015-05-21 DIAGNOSIS — I63433 Cerebral infarction due to embolism of bilateral posterior cerebral arteries: Secondary | ICD-10-CM

## 2015-05-21 DIAGNOSIS — I6389 Other cerebral infarction: Secondary | ICD-10-CM

## 2015-05-21 DIAGNOSIS — E785 Hyperlipidemia, unspecified: Secondary | ICD-10-CM | POA: Diagnosis not present

## 2015-05-21 DIAGNOSIS — N390 Urinary tract infection, site not specified: Secondary | ICD-10-CM | POA: Diagnosis not present

## 2015-05-21 DIAGNOSIS — I69351 Hemiplegia and hemiparesis following cerebral infarction affecting right dominant side: Secondary | ICD-10-CM | POA: Diagnosis not present

## 2015-05-21 DIAGNOSIS — J45909 Unspecified asthma, uncomplicated: Secondary | ICD-10-CM | POA: Diagnosis not present

## 2015-05-21 NOTE — Telephone Encounter (Signed)
Spouse called to request refill of medication. Advised patient has appointment on Wednesday for New Patient visit, can address at that time.

## 2015-05-22 DIAGNOSIS — N390 Urinary tract infection, site not specified: Secondary | ICD-10-CM | POA: Diagnosis not present

## 2015-05-22 DIAGNOSIS — J45909 Unspecified asthma, uncomplicated: Secondary | ICD-10-CM | POA: Diagnosis not present

## 2015-05-22 DIAGNOSIS — E785 Hyperlipidemia, unspecified: Secondary | ICD-10-CM | POA: Diagnosis not present

## 2015-05-22 DIAGNOSIS — B9620 Unspecified Escherichia coli [E. coli] as the cause of diseases classified elsewhere: Secondary | ICD-10-CM | POA: Diagnosis not present

## 2015-05-22 DIAGNOSIS — I69351 Hemiplegia and hemiparesis following cerebral infarction affecting right dominant side: Secondary | ICD-10-CM | POA: Diagnosis not present

## 2015-05-23 ENCOUNTER — Encounter: Payer: Self-pay | Admitting: Neurology

## 2015-05-23 ENCOUNTER — Telehealth: Payer: Self-pay | Admitting: Neurology

## 2015-05-23 ENCOUNTER — Ambulatory Visit (INDEPENDENT_AMBULATORY_CARE_PROVIDER_SITE_OTHER): Payer: Medicare Other | Admitting: Neurology

## 2015-05-23 VITALS — BP 126/66 | HR 67

## 2015-05-23 DIAGNOSIS — R269 Unspecified abnormalities of gait and mobility: Secondary | ICD-10-CM

## 2015-05-23 DIAGNOSIS — R531 Weakness: Secondary | ICD-10-CM | POA: Diagnosis not present

## 2015-05-23 DIAGNOSIS — I69398 Other sequelae of cerebral infarction: Secondary | ICD-10-CM

## 2015-05-23 DIAGNOSIS — R270 Ataxia, unspecified: Secondary | ICD-10-CM

## 2015-05-23 DIAGNOSIS — I69351 Hemiplegia and hemiparesis following cerebral infarction affecting right dominant side: Secondary | ICD-10-CM | POA: Diagnosis not present

## 2015-05-23 DIAGNOSIS — B9620 Unspecified Escherichia coli [E. coli] as the cause of diseases classified elsewhere: Secondary | ICD-10-CM | POA: Diagnosis not present

## 2015-05-23 DIAGNOSIS — J45909 Unspecified asthma, uncomplicated: Secondary | ICD-10-CM | POA: Diagnosis not present

## 2015-05-23 DIAGNOSIS — N390 Urinary tract infection, site not specified: Secondary | ICD-10-CM | POA: Diagnosis not present

## 2015-05-23 DIAGNOSIS — E785 Hyperlipidemia, unspecified: Secondary | ICD-10-CM | POA: Diagnosis not present

## 2015-05-23 MED ORDER — ASPIRIN 325 MG PO TBEC: 325.0000 mg | DELAYED_RELEASE_TABLET | Freq: Every day | ORAL | Status: DC

## 2015-05-23 MED ORDER — PRAVASTATIN SODIUM 20 MG PO TABS: 20.0000 mg | ORAL_TABLET | Freq: Every day | ORAL | Status: DC

## 2015-05-23 NOTE — Telephone Encounter (Signed)
Pt needs refill on aspirin EC 325 MG EC tablet and pravastatin (PRAVACHOL) 20 MG tablet. Thank you. Please use [facility]

## 2015-05-23 NOTE — Progress Notes (Addendum)
PATIENT: Darlene Murray DOB: 06-16-1922  Chief Complaint  Patient presents with  . Cerebrovascular Accident    MMSE 26/30 - 8 animals. She is here with her husband, Darlene Murray and son, Darlene Murray.  She is still receiving home PT and OT.  She has right-sided weakness, balance issues, memory difficulty and worsening right arm range of motion.     HISTORICAL  Darlene Murray is a 81 years old right-handed female, accompanied by her husband, and her son Darlene Murray at today's visit, following up her hospital admission for stroke in April 05, 2015  She had a past medical history of hypertension, hyperlipidemia, taking low-dose prednisone 5 mg daily for asthma, born blindness of right eye, left vision is good, she was highly functional prior to her most recent stroke, driving, cooking, ambulate without assistance   She was admitted to the hospital in April 04 2015 for acute onset of nausea or vomiting unsteady gait, I have personally reviewed MRI of the brain in April 06 2015, left hippocampus hemorrhagic stroke, also moderate nonhemorrhagic stroke involving posterior right upper and mid pons, adjacent right cerebellum, acute tiny nonhemorrhagic left thalamic infarction, the mechanism of her stroke was suspicious for embolic, she was put on aspirin 325 mg daily.  CT angiogram of head and neck showed no significant large vessel disease, intracranial atherosclerosis. Echocardiogram was normal, estimated ejection fraction 6065%, Laboratory showed LDL 104, A1c 6.0  With rehabilitation, she has regained marked recovery, she was able to ambulate without assistance, good range of motion of her right arm,  She was readmitted to the hospital again in October for UTI, and urosepsis, upon discharge in October 20 first 2016, she was noted to have profound right arm weakness, increased gait difficulty, now she is wheelchair bound, need assistant to take a few steps using bathroom,  Over the past Few weeks, she  again making some recovery, able to dress herself, but still has profound gait difficulty, she denies bowel and bladder incontinence. She complains of mild to moderate right shoulder achy pain, limited range of motion of her right shoulder.  REVIEW OF SYSTEMS: Full 14 system review of systems performed and notable only for memory loss, swelling legs  ALLERGIES: No Known Allergies  HOME MEDICATIONS: Current Outpatient Prescriptions  Medication Sig Dispense Refill  . albuterol (PROVENTIL HFA;VENTOLIN HFA) 108 (90 BASE) MCG/ACT inhaler Inhale 2 puffs into the lungs every 6 (six) hours as needed for wheezing or shortness of breath. 1 Inhaler 6  . aspirin EC 325 MG EC tablet Take 1 tablet (325 mg total) by mouth daily. 30 tablet 0  . calcium carbonate (OS-CAL) 600 MG TABS Take 600 mg by mouth daily.      . cholecalciferol (VITAMIN D) 1000 UNITS tablet Take 1,000 Units by mouth daily.    . Multiple Vitamin (MULTIVITAMIN) tablet Take 1 tablet by mouth daily.    . pravastatin (PRAVACHOL) 20 MG tablet Take 1 tablet (20 mg total) by mouth daily at 6 PM. 30 tablet 0  . predniSONE (DELTASONE) 5 MG tablet Take 1 tablet (5 mg total) by mouth daily. 30 tablet 5  . Pyridoxine HCl (VITAMIN B6 PO) Take by mouth daily.     No current facility-administered medications for this visit.    PAST MEDICAL HISTORY: Past Medical History  Diagnosis Date  . Blindness   . Asthmatic bronchitis   . Asthma   . Fibrocystic breast disease   . Allergic rhinitis   . Hyperlipidemia   . Venous insufficiency  lower extremities  . HNP (herniated nucleus pulposus), lumbar     L4-L5  . Left ventricular hypertrophy 2008    mild  . Antibiotic-associated diarrhea   . Stroke (cerebrum) ([place])     PAST SURGICAL HISTORY: No past surgical history on file.  FAMILY HISTORY: Family History  Problem Relation Age of Onset  . Heart attack Sister   . Alzheimer's disease Sister   . Leukemia Mother   . Asthma Son      SOCIAL HISTORY:  Social History   Social History  . Marital Status: Married    Spouse Name: N/A  . Number of Children: 3  . Years of Education: College   Occupational History  . Retired     Pharmacist, hospital   Social History Main Topics  . Smoking status: Never Smoker   . Smokeless tobacco: Never Used  . Alcohol Use: No  . Drug Use: No  . Sexual Activity:    Partners: Female   Other Topics Concern  . Not on file   Social History Narrative   Lives at home with her husband.   Right-handed.   1 cup coffee per day.     PHYSICAL EXAM   Filed Vitals:   05/23/15 0832  BP: 126/66  Pulse: 67    Not recorded      There is no weight on file to calculate BMI.  PHYSICAL EXAMNIATION:  Gen: NAD, conversant, well nourised, obese, well groomed                     Cardiovascular: Regular rate rhythm, no peripheral edema, warm, nontender. Eyes: Conjunctivae clear without exudates or hemorrhage Neck: Supple, no carotid bruise. Pulmonary: Clear to auscultation bilaterally   NEUROLOGICAL EXAM:  MENTAL STATUS: Speech:    Speech is normal; fluent and spontaneous with normal comprehension.  Cognition: Mini-Mental Status Examination is 26 out of 30, animal naming is 8     Orientation to time, place and person     Recent and remote Memory: She missed 3 out of 3 recalls     Normal Attention span and concentration     Normal Language, naming, repeating,spontaneous speech     Fund of knowledge   CRANIAL NERVES: CN II: Visual fields are full to confrontation. Left pupil is round reactive to light CN III, IV, VI: extraocular movement are normal. No ptosis. CN V: Facial sensation is intact to pinprick in all 3 divisions bilaterally. Corneal responses are intact.  CN VII: Face is symmetric with normal eye closure and smile. CN VIII: Hearing is normal to rubbing fingers CN IX, X: Palate elevates symmetrically. Phonation is normal. CN XI: Head turning and shoulder shrug are intact CN  XII: Tongue is midline with normal movements and no atrophy.  MOTOR: She has mild spasticity of right upper extremity, complains of right shoulder pain, was able to hold right arm in abduction position, but was not able to initiate right shoulder abduction movement by herself, right elbow flexion 5, extension 4, right shoulder external rotation 3, wrist flexion-extension 5, mild weakness at right hand grip. She has mild right hip flexion weakness, mild spasticity of right lower extremity,   REFLEXES: Reflexes are  hyperreflexia in both upper and lower extremity, right worse than left, plantar responses were extensor bilaterally   SENSORY: Intact to light touch, pinprick, position sense, and vibration sense are intact in fingers and toes.  COORDINATION: No dysmetria   GAIT/STANCE: She needs 2 people assistant to get  up from seated position, right lower extremity spasticity circumferential, very unsteady gait   DIAGNOSTIC DATA (LABS, IMAGING, TESTING) - I reviewed patient records, labs, notes, testing and imaging myself where available.   ASSESSMENT AND PLAN  Darlene Murray is a 81 y.o. female   Stroke   possible embolic stroke,  Keep aspirin 325 mg daily   Gait difficulty   Differentiation diagnosis including recurrent stroke, versus cervical spondylitic myelopathy   proceed with MRI of cervical spine, MRI of brain  Continue rehabilitation  Return to clinic in 2 weeks Mild cognitive impairment  Mini-Mental Status Examination is 26 out of 30 today,  Age-related process, central nervous system degenerative disorder, likely a vascular component. Possible right shoulder pathology  With her complains of right shoulder pain, limited range of motion of her right shoulder,    Marcial Pacas, M.D. Ph.D.  [facility] [address] [place], [place] [address] Ph: [phone] Fax: [phone]  CC: Hulan Fess, MD

## 2015-05-23 NOTE — Telephone Encounter (Signed)
I have refilled her aspirin, and pravastatin 90 day supply with 3 refills

## 2015-05-24 ENCOUNTER — Telehealth: Payer: Self-pay | Admitting: Pulmonary Disease

## 2015-05-24 ENCOUNTER — Ambulatory Visit (INDEPENDENT_AMBULATORY_CARE_PROVIDER_SITE_OTHER): Payer: Medicare Other | Admitting: Pulmonary Disease

## 2015-05-24 ENCOUNTER — Encounter: Payer: Self-pay | Admitting: Pulmonary Disease

## 2015-05-24 VITALS — BP 126/66 | HR 71 | Ht 63.0 in | Wt 160.8 lb

## 2015-05-24 DIAGNOSIS — J3089 Other allergic rhinitis: Secondary | ICD-10-CM | POA: Diagnosis not present

## 2015-05-24 DIAGNOSIS — J4550 Severe persistent asthma, uncomplicated: Secondary | ICD-10-CM | POA: Diagnosis not present

## 2015-05-24 DIAGNOSIS — I639 Cerebral infarction, unspecified: Secondary | ICD-10-CM

## 2015-05-24 NOTE — Patient Instructions (Signed)
1. Remember to records your peak flow volumes twice a day for 2 weeks. Then use your peak flow meter once daily. Notify me if your volume drops by more than 20% as this could indicate an asthma exacerbation looming. 2. Call me if you need any refills or have any breathing problems before your next appointment. 3. You can use your albuterol inhaler on an as needed basis:  2 inhalations every 4 hours as needed for coughing, wheezing, or shortness of breath. 4. I will see you back in 6 months or sooner if needed.

## 2015-05-24 NOTE — Telephone Encounter (Signed)
PFT 12/04/10: FVC 2.12 L (87%) FEV1 1.52 L (94%) FEV1/FVC 0.72 FEF 25-75 0.99 L (55%) no bronchodilator response TLC 3.97 L (85%) RV 89% ERV 40% DLCO uncorrected 44%  IMAGING PORT CXR 04/23/15 (personally reviewed by me): No definite focal opacity. Silhouetting of the left hemidiaphragm. Low lung volumes. Unable to appreciate any pleural effusion. Mediastinum appears normal in contour.  MICROBIOLOGY Aspergillus fumigatus IgG (11/07/10):  <0.35  LABS 04/27/15 BMP:  144/3.3/113/22/9/0.88/86/8.3

## 2015-05-24 NOTE — Progress Notes (Signed)
Subjective:    Patient ID: Darlene Murray, female    DOB: [birth year], 81 y.o.   MRN: [id number]  C.C.:  Follow-up for Severe, Persistent Asthma & Allergic Rhinitis.  HPI Severe, Persistent Asthma: Patient is steroid dependent on prednisone 5 mg by mouth day. She uses her albuterol inhaler twice a day. She reports this may be more out of habit. She reports infrequent, nonproductive cough. Cough is mostly at night or first thing in the morning. No wheezing. No exacerbations since last appointment. She denies any dyspnea. She denies any recurrent bronchitis.  Allergic Rhinitis:  No sinus congestion, drainage, or pressure. She reports in recent years she has had less problems with seasonal allergies.  Review of Systems No chest pain or pressure. No reflux, dyspepsia, or morning brash water taste.  No Known Allergies  Current Outpatient Prescriptions on File Prior to Visit  Medication Sig Dispense Refill  . albuterol (PROVENTIL HFA;VENTOLIN HFA) 108 (90 BASE) MCG/ACT inhaler Inhale 2 puffs into the lungs every 6 (six) hours as needed for wheezing or shortness of breath. 1 Inhaler 6  . aspirin 325 MG EC tablet Take 1 tablet (325 mg total) by mouth daily. 90 tablet 3  . calcium carbonate (OS-CAL) 600 MG TABS Take 600 mg by mouth daily.      . cholecalciferol (VITAMIN D) 1000 UNITS tablet Take 1,000 Units by mouth daily.    . Multiple Vitamin (MULTIVITAMIN) tablet Take 1 tablet by mouth daily.    . pravastatin (PRAVACHOL) 20 MG tablet Take 1 tablet (20 mg total) by mouth daily at 6 PM. 90 tablet 3  . predniSONE (DELTASONE) 5 MG tablet Take 1 tablet (5 mg total) by mouth daily. 30 tablet 5  . Pyridoxine HCl (VITAMIN B6 PO) Take by mouth daily.     No current facility-administered medications on file prior to visit.    Past Medical History  Diagnosis Date  . Blindness   . Asthmatic bronchitis   . Asthma   . Fibrocystic breast disease   . Allergic rhinitis   . Hyperlipidemia   . Venous  insufficiency     lower extremities  . HNP (herniated nucleus pulposus), lumbar     L4-L5  . Left ventricular hypertrophy 2008    mild  . Antibiotic-associated diarrhea   . Stroke (cerebrum) [facility])     Past Surgical History  Procedure Laterality Date  . Cataract extraction    . Colonoscopy      Family History  Problem Relation Age of Onset  . Heart attack Sister   . Alzheimer's disease Sister   . Asthma Son     Social History   Social History  . Marital Status: Married    Spouse Name: N/A  . Number of Children: 3  . Years of Education: College   Occupational History  . Retired     Pharmacist, hospital   Social History Main Topics  . Smoking status: Passive Smoke Exposure - Never Smoker  . Smokeless tobacco: Never Used     Comment: Prior exposure through husband.   . Alcohol Use: No  . Drug Use: No  . Sexual Activity:    Partners: Female   Other Topics Concern  . None   Social History Narrative   Lives at home with her husband.   Right-handed.   1 cup coffee per day.      [facility]:   Originally from [facility]. She has previously lived in [place]. Previously traveled to [place]. No pets  currently. No prior bird exposure. Previously worked as a Education officer, museum. No mold or hot tub exposure.       Objective:   Physical Exam BP 126/66 mmHg  Pulse 71  Ht 5\' 3"  (1.6 m)  Wt 160 lb 12.8 oz (72.938 kg)  BMI 28.49 kg/m2  SpO2 97% General:  Awake. Alert. No acute distress. Sitting in wheelchair.   Integument:  Warm & dry. No rash on exposed skin. No bruising. Seborrheic keratoses noted. HEENT:  Moist mucus membranes. No oral ulcers. No scleral injection or icterus.  Cardiovascular:  Regular rate. No edema. Normal S1 & S2.  Pulmonary:  Good aeration bilaterally. Mild bilateral squeaks. Symmetric chest wall expansion. No accessory muscle use. Abdomen: Soft. Normal bowel sounds. Nondistended. Grossly nontender.  PFT 12/04/10: FVC 2.12 L (87%) FEV1 1.52 L (94%) FEV1/FVC  0.72 FEF 25-75 0.99 L (55%) no bronchodilator response TLC 3.97 L (85%) RV 89% ERV 40% DLCO uncorrected 44%  IMAGING PORT CXR 04/23/15 (personally reviewed by me): No definite focal opacity. Silhouetting of the left hemidiaphragm. Low lung volumes. Unable to appreciate any pleural effusion. Mediastinum appears normal in contour.  MICROBIOLOGY Aspergillus fumigatus IgG (11/07/10): <0.35  LABS 04/27/15 BMP: 144/3.3/113/22/9/0.88/86/8.3    Assessment & Plan:  81 year old African female with long history of severe, persistent asthma that is steroid dependent. Patient's asthma seems to be quiescent at this time. She has had no exacerbation since her last appointment. Prior pulmonary function testing from 2012 reviewed by me without evidence of fixed airways obstruction. Patient also denies any current symptoms from her allergic rhinitis. Reviewing her portable chest x-ray from October there is no definite focal opacity but there is some silhouetting of the left hemidiaphragm. I spent a significant amount of time today reviewing the patient's past histories. I instructed the patient contact my office if she had any new breathing problems before her next appointment.  1. Severe, persistent asthma: Steroid dependent. Instructed the patient to use her albuterol inhaler on an as-needed basis. Patient also provided with a peak flow meter and instructed on proper frequency and technique. She'll notify my office for a 20% reduction in volume. 2. Allergic rhinitis: Asymptomatic on prednisone 5 mg by mouth daily. 3. Maintenance: Patient reports she did receive influenza vaccine this fall. 4. Follow-up: Patient to return to clinic in 6 months or sooner if needed.

## 2015-05-25 ENCOUNTER — Ambulatory Visit: Payer: Self-pay | Admitting: Podiatry

## 2015-05-25 DIAGNOSIS — B9620 Unspecified Escherichia coli [E. coli] as the cause of diseases classified elsewhere: Secondary | ICD-10-CM | POA: Diagnosis not present

## 2015-05-25 DIAGNOSIS — E785 Hyperlipidemia, unspecified: Secondary | ICD-10-CM | POA: Diagnosis not present

## 2015-05-25 DIAGNOSIS — I69351 Hemiplegia and hemiparesis following cerebral infarction affecting right dominant side: Secondary | ICD-10-CM | POA: Diagnosis not present

## 2015-05-25 DIAGNOSIS — J45909 Unspecified asthma, uncomplicated: Secondary | ICD-10-CM | POA: Diagnosis not present

## 2015-05-25 DIAGNOSIS — N390 Urinary tract infection, site not specified: Secondary | ICD-10-CM | POA: Diagnosis not present

## 2015-05-25 NOTE — Telephone Encounter (Signed)
Rx has been refaxed, receipt conformed by pharmacy.

## 2015-05-25 NOTE — Telephone Encounter (Signed)
Patient's son is calling regarding medication pravastatin (PRAVACHOL) 20 MG tablet. The pharmacy says they did not get a request for this medication. Please call to [facility] on [facility] [facility]. Thank you.

## 2015-05-28 DIAGNOSIS — J45909 Unspecified asthma, uncomplicated: Secondary | ICD-10-CM | POA: Diagnosis not present

## 2015-05-28 DIAGNOSIS — B9620 Unspecified Escherichia coli [E. coli] as the cause of diseases classified elsewhere: Secondary | ICD-10-CM | POA: Diagnosis not present

## 2015-05-28 DIAGNOSIS — N390 Urinary tract infection, site not specified: Secondary | ICD-10-CM | POA: Diagnosis not present

## 2015-05-28 DIAGNOSIS — E785 Hyperlipidemia, unspecified: Secondary | ICD-10-CM | POA: Diagnosis not present

## 2015-05-28 DIAGNOSIS — I69351 Hemiplegia and hemiparesis following cerebral infarction affecting right dominant side: Secondary | ICD-10-CM | POA: Diagnosis not present

## 2015-05-29 DIAGNOSIS — E785 Hyperlipidemia, unspecified: Secondary | ICD-10-CM | POA: Diagnosis not present

## 2015-05-29 DIAGNOSIS — B9620 Unspecified Escherichia coli [E. coli] as the cause of diseases classified elsewhere: Secondary | ICD-10-CM | POA: Diagnosis not present

## 2015-05-29 DIAGNOSIS — J45909 Unspecified asthma, uncomplicated: Secondary | ICD-10-CM | POA: Diagnosis not present

## 2015-05-29 DIAGNOSIS — I69351 Hemiplegia and hemiparesis following cerebral infarction affecting right dominant side: Secondary | ICD-10-CM | POA: Diagnosis not present

## 2015-05-29 DIAGNOSIS — N390 Urinary tract infection, site not specified: Secondary | ICD-10-CM | POA: Diagnosis not present

## 2015-05-30 DIAGNOSIS — E785 Hyperlipidemia, unspecified: Secondary | ICD-10-CM | POA: Diagnosis not present

## 2015-05-30 DIAGNOSIS — I69351 Hemiplegia and hemiparesis following cerebral infarction affecting right dominant side: Secondary | ICD-10-CM | POA: Diagnosis not present

## 2015-05-30 DIAGNOSIS — J45909 Unspecified asthma, uncomplicated: Secondary | ICD-10-CM | POA: Diagnosis not present

## 2015-05-30 DIAGNOSIS — N390 Urinary tract infection, site not specified: Secondary | ICD-10-CM | POA: Diagnosis not present

## 2015-05-30 DIAGNOSIS — B9620 Unspecified Escherichia coli [E. coli] as the cause of diseases classified elsewhere: Secondary | ICD-10-CM | POA: Diagnosis not present

## 2015-06-04 DIAGNOSIS — E785 Hyperlipidemia, unspecified: Secondary | ICD-10-CM | POA: Diagnosis not present

## 2015-06-04 DIAGNOSIS — I69351 Hemiplegia and hemiparesis following cerebral infarction affecting right dominant side: Secondary | ICD-10-CM | POA: Diagnosis not present

## 2015-06-04 DIAGNOSIS — J45909 Unspecified asthma, uncomplicated: Secondary | ICD-10-CM | POA: Diagnosis not present

## 2015-06-04 DIAGNOSIS — N390 Urinary tract infection, site not specified: Secondary | ICD-10-CM | POA: Diagnosis not present

## 2015-06-04 DIAGNOSIS — B9620 Unspecified Escherichia coli [E. coli] as the cause of diseases classified elsewhere: Secondary | ICD-10-CM | POA: Diagnosis not present

## 2015-06-06 ENCOUNTER — Telehealth: Payer: Self-pay | Admitting: Pulmonary Disease

## 2015-06-06 DIAGNOSIS — B9620 Unspecified Escherichia coli [E. coli] as the cause of diseases classified elsewhere: Secondary | ICD-10-CM | POA: Diagnosis not present

## 2015-06-06 DIAGNOSIS — E785 Hyperlipidemia, unspecified: Secondary | ICD-10-CM | POA: Diagnosis not present

## 2015-06-06 DIAGNOSIS — J45909 Unspecified asthma, uncomplicated: Secondary | ICD-10-CM | POA: Diagnosis not present

## 2015-06-06 DIAGNOSIS — I69351 Hemiplegia and hemiparesis following cerebral infarction affecting right dominant side: Secondary | ICD-10-CM | POA: Diagnosis not present

## 2015-06-06 DIAGNOSIS — N390 Urinary tract infection, site not specified: Secondary | ICD-10-CM | POA: Diagnosis not present

## 2015-06-06 NOTE — Telephone Encounter (Signed)
Called spoke with Darlene Murray. She reports we need to call pt. Called pt and she reports her husband is not home currently and will have to wait to schedule an appt to be seen. She will call once he comes in. Will await call from pt.

## 2015-06-07 ENCOUNTER — Encounter: Payer: Self-pay | Admitting: Pulmonary Disease

## 2015-06-07 ENCOUNTER — Telehealth: Payer: Self-pay | Admitting: Pulmonary Disease

## 2015-06-07 ENCOUNTER — Ambulatory Visit (INDEPENDENT_AMBULATORY_CARE_PROVIDER_SITE_OTHER): Payer: Medicare Other | Admitting: Pulmonary Disease

## 2015-06-07 VITALS — BP 144/74 | HR 69 | Temp 98.0°F | Ht 62.0 in | Wt 168.2 lb

## 2015-06-07 DIAGNOSIS — J45909 Unspecified asthma, uncomplicated: Secondary | ICD-10-CM | POA: Diagnosis not present

## 2015-06-07 DIAGNOSIS — J4551 Severe persistent asthma with (acute) exacerbation: Secondary | ICD-10-CM

## 2015-06-07 DIAGNOSIS — B9620 Unspecified Escherichia coli [E. coli] as the cause of diseases classified elsewhere: Secondary | ICD-10-CM | POA: Diagnosis not present

## 2015-06-07 DIAGNOSIS — I639 Cerebral infarction, unspecified: Secondary | ICD-10-CM | POA: Diagnosis not present

## 2015-06-07 DIAGNOSIS — I69351 Hemiplegia and hemiparesis following cerebral infarction affecting right dominant side: Secondary | ICD-10-CM | POA: Diagnosis not present

## 2015-06-07 DIAGNOSIS — N390 Urinary tract infection, site not specified: Secondary | ICD-10-CM | POA: Diagnosis not present

## 2015-06-07 DIAGNOSIS — E785 Hyperlipidemia, unspecified: Secondary | ICD-10-CM | POA: Diagnosis not present

## 2015-06-07 MED ORDER — PREDNISONE 10 MG PO TABS: ORAL_TABLET | ORAL | Status: DC

## 2015-06-07 NOTE — Telephone Encounter (Signed)
Duplicate message. See prior phone note 

## 2015-06-07 NOTE — Telephone Encounter (Signed)
Called spoke with Patty w/ [facility]. Made aware pt scheduled to be seen today. Nothing further needed

## 2015-06-07 NOTE — Progress Notes (Signed)
   Subjective:    Patient ID: Darlene Murray, female    DOB: October 16, 1921, 81 y.o.   MRN: [id number]  HPI Acute visit for asthma exacerbation  Darlene Murray is a 81 year old with severe persistent asthma, frequent exacerbations, steroid dependence. She is a patient of Dr. Ashok Cordia. She has developed a dry nonproductive cough 4 days ago. Wheezing with increased dyspnea began 2 days ago. She denies any fevers or chills. There are no exacerbating factors or sick exposures.  Past Medical History  Diagnosis Date  . Blindness   . Asthmatic bronchitis   . Asthma   . Fibrocystic breast disease   . Allergic rhinitis   . Hyperlipidemia   . Venous insufficiency     lower extremities  . HNP (herniated nucleus pulposus), lumbar     L4-L5  . Left ventricular hypertrophy 2008    mild  . Antibiotic-associated diarrhea   . Stroke (cerebrum) ([place])     Current outpatient prescriptions:  .  albuterol (PROVENTIL HFA;VENTOLIN HFA) 108 (90 BASE) MCG/ACT inhaler, Inhale 2 puffs into the lungs every 6 (six) hours as needed for wheezing or shortness of breath., Disp: 1 Inhaler, Rfl: 6 .  aspirin 325 MG EC tablet, Take 1 tablet (325 mg total) by mouth daily., Disp: 90 tablet, Rfl: 3 .  calcium carbonate (OS-CAL) 600 MG TABS, Take 600 mg by mouth daily.  , Disp: , Rfl:  .  cholecalciferol (VITAMIN D) 1000 UNITS tablet, Take 1,000 Units by mouth daily., Disp: , Rfl:  .  Multiple Vitamin (MULTIVITAMIN) tablet, Take 1 tablet by mouth daily., Disp: , Rfl:  .  pravastatin (PRAVACHOL) 20 MG tablet, Take 1 tablet (20 mg total) by mouth daily at 6 PM., Disp: 90 tablet, Rfl: 3 .  predniSONE (DELTASONE) 5 MG tablet, Take 1 tablet (5 mg total) by mouth daily., Disp: 30 tablet, Rfl: 5 .  Pyridoxine HCl (VITAMIN B6 PO), Take by mouth daily., Disp: , Rfl:    Review of Systems Cough, nonproductive, wheezing, dyspnea. Denies any fevers, chills, malaise, fatigue. Denies any nausea, vomiting, constipation, diarrhea. Denies any  chest pain, palpitations. All other review of systems negative    Objective:   Physical Exam  Blood pressure 144/74, pulse 69, temperature 98 F (36.7 C), temperature source Oral, height 5\' 2"  (1.575 m), weight 168 lb 3.2 oz (76.295 kg), SpO2 95 %.  Gen: No apparent distress Neuro: No gross focal deficits. Neck: No JVD, lymphadenopathy, thyromegaly. RS: No crackles, mild expiratory wheezes, nonlabored breathing. CVS: S1-S2 heard, no murmurs rubs gallops. Abdomen: Soft, positive bowel sounds. Extremities: No edema.    Assessment & Plan:  Severe persistent asthma with acute exacerbation  I will treat her with a short course of prednisone starting at 40 mg. Reduce by 10 mg every 2 days until she reaches her baseline dose of 5 mg. Seems likely that her asthma exacerbation is set off by a viral infection. I don't believe she will need any antibiotics at present. I have instructed her that if she should develop fevers, chills or her cough or sputum she should call us.  Plan: - Prednisone taper starting at 40 mg. - Follow-up with Dr. Cherene Altes MD [facility] Pager [phone] If no answer or after 3pm call: [phone] 06/07/2015, 2:36 PM

## 2015-06-07 NOTE — Telephone Encounter (Signed)
Patty returning call.Stanley A Dalton °

## 2015-06-07 NOTE — Telephone Encounter (Signed)
LMTCB x1 for Darlene Murray has appt scheduled today with Dr. Vaughan Browner

## 2015-06-07 NOTE — Addendum Note (Signed)
Addended by: Parke Poisson E on: 06/07/2015 05:44 PM   Modules accepted: Orders

## 2015-06-07 NOTE — Patient Instructions (Signed)
We will start you on prednisone taper starting at 40 mg. Reduce by 10 mg every 2 days until you reach your usual dose of 5 mg. If you develop fevers or chills or cough worsens or sputum changes color please let us know. He may need a chest x-ray and antibiotics in addition to the steroids

## 2015-06-08 DIAGNOSIS — J45909 Unspecified asthma, uncomplicated: Secondary | ICD-10-CM | POA: Diagnosis not present

## 2015-06-08 DIAGNOSIS — N390 Urinary tract infection, site not specified: Secondary | ICD-10-CM | POA: Diagnosis not present

## 2015-06-08 DIAGNOSIS — I69351 Hemiplegia and hemiparesis following cerebral infarction affecting right dominant side: Secondary | ICD-10-CM | POA: Diagnosis not present

## 2015-06-08 DIAGNOSIS — B9620 Unspecified Escherichia coli [E. coli] as the cause of diseases classified elsewhere: Secondary | ICD-10-CM | POA: Diagnosis not present

## 2015-06-08 DIAGNOSIS — E785 Hyperlipidemia, unspecified: Secondary | ICD-10-CM | POA: Diagnosis not present

## 2015-06-11 DIAGNOSIS — E785 Hyperlipidemia, unspecified: Secondary | ICD-10-CM | POA: Diagnosis not present

## 2015-06-11 DIAGNOSIS — N390 Urinary tract infection, site not specified: Secondary | ICD-10-CM | POA: Diagnosis not present

## 2015-06-11 DIAGNOSIS — B9620 Unspecified Escherichia coli [E. coli] as the cause of diseases classified elsewhere: Secondary | ICD-10-CM | POA: Diagnosis not present

## 2015-06-11 DIAGNOSIS — J45909 Unspecified asthma, uncomplicated: Secondary | ICD-10-CM | POA: Diagnosis not present

## 2015-06-11 DIAGNOSIS — I69351 Hemiplegia and hemiparesis following cerebral infarction affecting right dominant side: Secondary | ICD-10-CM | POA: Diagnosis not present

## 2015-06-12 ENCOUNTER — Ambulatory Visit
Admission: RE | Admit: 2015-06-12 | Discharge: 2015-06-12 | Disposition: A | Discharge status: Home / Self Care | Payer: Medicare Other | Source: Ambulatory Visit | Attending: Neurology | Admitting: Neurology

## 2015-06-12 DIAGNOSIS — R269 Unspecified abnormalities of gait and mobility: Secondary | ICD-10-CM

## 2015-06-12 DIAGNOSIS — R270 Ataxia, unspecified: Secondary | ICD-10-CM

## 2015-06-12 DIAGNOSIS — I69398 Other sequelae of cerebral infarction: Secondary | ICD-10-CM | POA: Diagnosis not present

## 2015-06-12 DIAGNOSIS — R531 Weakness: Secondary | ICD-10-CM

## 2015-06-13 ENCOUNTER — Encounter: Payer: Self-pay | Admitting: Neurology

## 2015-06-13 ENCOUNTER — Ambulatory Visit (INDEPENDENT_AMBULATORY_CARE_PROVIDER_SITE_OTHER): Payer: Medicare Other | Admitting: Neurology

## 2015-06-13 VITALS — BP 104/61 | HR 63

## 2015-06-13 DIAGNOSIS — I638 Other cerebral infarction: Secondary | ICD-10-CM

## 2015-06-13 DIAGNOSIS — R269 Unspecified abnormalities of gait and mobility: Secondary | ICD-10-CM | POA: Diagnosis not present

## 2015-06-13 DIAGNOSIS — R413 Other amnesia: Secondary | ICD-10-CM

## 2015-06-13 DIAGNOSIS — I6389 Other cerebral infarction: Secondary | ICD-10-CM

## 2015-06-13 DIAGNOSIS — I6310 Cerebral infarction due to embolism of unspecified precerebral artery: Secondary | ICD-10-CM

## 2015-06-13 DIAGNOSIS — I69398 Other sequelae of cerebral infarction: Secondary | ICD-10-CM | POA: Diagnosis not present

## 2015-06-13 DIAGNOSIS — M4712 Other spondylosis with myelopathy, cervical region: Secondary | ICD-10-CM | POA: Diagnosis not present

## 2015-06-13 DIAGNOSIS — I639 Cerebral infarction, unspecified: Secondary | ICD-10-CM | POA: Diagnosis not present

## 2015-06-13 MED ORDER — CLOPIDOGREL BISULFATE 75 MG PO TABS: 75.0000 mg | ORAL_TABLET | Freq: Every day | ORAL | Status: AC

## 2015-06-13 NOTE — Progress Notes (Signed)
Chief Complaint  Patient presents with  . Gait Problem    She is here with her husband and son.  They would like to discuss her MRI results.  She is still having gait difficulty and balance isssues.  Says she fell on 06/12/15 and hit her head (this occurred prior to her MRI).      PATIENT: Darlene Murray DOB: Sep 19, 1921  Chief Complaint  Patient presents with  . Gait Problem    She is here with her husband and son.  They would like to discuss her MRI results.  She is still having gait difficulty and balance isssues.  Says she fell on 06/12/15 and hit her head (this occurred prior to her MRI).     HISTORICAL  Darlene Murray is a 81 years old right-handed female, accompanied by her husband, and her son [name] at today's visit, following up her hospital admission for stroke in April 05, 2015  She had a past medical history of hypertension, hyperlipidemia, taking low-dose prednisone 5 mg daily for asthma, born blindness of right eye, left vision is good, she was highly functional prior to her most recent stroke, driving, cooking, ambulate without assistance   She was admitted to the hospital in April 04 2015 for acute onset of nausea or vomiting unsteady gait, I have personally reviewed MRI of the brain in April 06 2015, left hippocampus hemorrhagic stroke, also moderate nonhemorrhagic stroke involving posterior right upper and mid pons, adjacent right cerebellum, acute tiny nonhemorrhagic left thalamic infarction, the mechanism of her stroke was suspicious for embolic, she was put on aspirin 325 mg daily.  CT angiogram of head and neck showed no significant large vessel disease, intracranial atherosclerosis. Echocardiogram was normal, estimated ejection fraction 60-65%, Laboratory showed LDL 104, A1c 6.0  With rehabilitation, she has regained marked recovery, she was able to ambulate without assistance, good range of motion of her right arm,  She was readmitted to the hospital again  in October for UTI, and urosepsis, upon discharge in October 21st 2016, she was noted to have profound right arm weakness, increased gait difficulty, now she is wheelchair bound, need assistant to take a few steps using bathroom,  Over the past few weeks, she again making some recovery, able to dress herself, but still has profound gait difficulty, she denies bowel and bladder incontinence. She complains of mild to moderate right shoulder achy pain, limited range of motion of her right shoulder.  UPDATE Jun 13 2015: She fell twice since last visit, her right leg gave out, she still has significant right arm and leg weakness, gait difficulty,  We have personally reviewed MRI brain showed  1. There are new subacute infarcts in the left thalamus, left pons and left cerebellum that are new compared to MRI on 04/06/15.  2. Chronic infarcts noted the left antero-medial thalamus, right cerebellum, right pons and left hippocampus. These are stable from MRI on 04/06/15.  3. Mild scattered periventricular and subcortical chronic small vessel ischemic disease.   MRI cervical showed multi-level degenerative changes, with mild canal stenosis at C4-5, no cord signal changes, muti-level foraminal stenosis.  She was on ASA 325mg  daily  REVIEW OF SYSTEMS: Full 14 system review of systems performed and notable only for cough, wheezing, walking difficulty, dizziness, weakness, for  ALLERGIES: No Known Allergies  HOME MEDICATIONS: Current Outpatient Prescriptions  Medication Sig Dispense Refill  . albuterol (PROVENTIL HFA;VENTOLIN HFA) 108 (90 BASE) MCG/ACT inhaler Inhale 2 puffs into the lungs every 6 (six) hours  as needed for wheezing or shortness of breath. 1 Inhaler 6  . aspirin 325 MG EC tablet Take 1 tablet (325 mg total) by mouth daily. 90 tablet 3  . calcium carbonate (OS-CAL) 600 MG TABS Take 600 mg by mouth daily.      . cholecalciferol (VITAMIN D) 1000 UNITS tablet Take 1,000 Units by mouth daily.     . Multiple Vitamin (MULTIVITAMIN) tablet Take 1 tablet by mouth daily.    . pravastatin (PRAVACHOL) 20 MG tablet Take 1 tablet (20 mg total) by mouth daily at 6 PM. 90 tablet 3  . predniSONE (DELTASONE) 10 MG tablet Take 4 tabs for 2 days, then 3 tabs for 2 days, 2 tabs for 2 days, then 1 tab for 2 days, then stop. 20 tablet 0  . predniSONE (DELTASONE) 5 MG tablet Take 1 tablet (5 mg total) by mouth daily. 30 tablet 5  . Pyridoxine HCl (VITAMIN B6 PO) Take by mouth daily.     No current facility-administered medications for this visit.    PAST MEDICAL HISTORY: Past Medical History  Diagnosis Date  . Blindness   . Asthmatic bronchitis   . Asthma   . Fibrocystic breast disease   . Allergic rhinitis   . Hyperlipidemia   . Venous insufficiency     lower extremities  . HNP (herniated nucleus pulposus), lumbar     L4-L5  . Left ventricular hypertrophy 2008    mild  . Antibiotic-associated diarrhea   . Stroke (cerebrum) ([place])     PAST SURGICAL HISTORY: Past Surgical History  Procedure Laterality Date  . Cataract extraction    . Colonoscopy      FAMILY HISTORY: Family History  Problem Relation Age of Onset  . Heart attack Sister   . Alzheimer's disease Sister   . Asthma Son     SOCIAL HISTORY:  Social History   Social History  . Marital Status: Married    Spouse Name: N/A  . Number of Children: 3  . Years of Education: College   Occupational History  . Retired     Pharmacist, hospital   Social History Main Topics  . Smoking status: Passive Smoke Exposure - Never Smoker  . Smokeless tobacco: Never Used     Comment: Prior exposure through husband.   . Alcohol Use: No  . Drug Use: No  . Sexual Activity:    Partners: Female   Other Topics Concern  . Not on file   Social History Narrative   Lives at home with her husband.   Right-handed.   1 cup coffee per day.      [facility]:   Originally from [facility] [place] [facility]. She has previously lived in [place]. Previously traveled to  [place]. No pets currently. No prior bird exposure. Previously worked as a Education officer, museum. No mold or hot tub exposure.      PHYSICAL EXAM   Filed Vitals:   06/13/15 1018  BP: 104/61  Pulse: 63    Not recorded      There is no weight on file to calculate BMI.  PHYSICAL EXAMNIATION:  Gen: NAD, conversant, well nourised, obese, well groomed                     Cardiovascular: Regular rate rhythm, no peripheral edema, warm, nontender. Eyes: Conjunctivae clear without exudates or hemorrhage Neck: Supple, no carotid bruise. Pulmonary: Clear to auscultation bilaterally   NEUROLOGICAL EXAM:  MENTAL STATUS: Speech:    Speech is normal;  fluent and spontaneous with normal comprehension.  Cognition: Mini-Mental Status Examination is 26 out of 30, animal naming is 8     Orientation to time, place and person     Recent and remote Memory: She missed 3 out of 3 recalls     Normal Attention span and concentration     Normal Language, naming, repeating,spontaneous speech     Fund of knowledge   CRANIAL NERVES: CN II: Visual fields are full to confrontation. Left pupil is round reactive to light CN III, IV, VI: extraocular movement are normal. No ptosis. CN V: Facial sensation is intact to pinprick in all 3 divisions bilaterally. Corneal responses are intact.  CN VII: Face is symmetric with normal eye closure and smile. CN VIII: Hearing is normal to rubbing fingers CN IX, X: Palate elevates symmetrically. Phonation is normal. CN XI: Head turning and shoulder shrug are intact CN XII: Tongue is midline with normal movements and no atrophy.  MOTOR: She has mild spasticity of right upper extremity, complains of right shoulder pain, was able to hold right arm in abduction position, but was not able to initiate right shoulder abduction movement by herself, right elbow flexion 5, extension 4, right shoulder external rotation 3, wrist flexion-extension 5, mild weakness at right hand  grip. She has mild right hip flexion weakness, mild spasticity of right lower extremity,   REFLEXES: Reflexes are  hyperreflexia in both upper and lower extremity, right worse than left, plantar responses were extensor bilaterally   SENSORY: Intact to light touch, pinprick, position sense, and vibration sense are intact in fingers and toes.  COORDINATION: No dysmetria   GAIT/STANCE: She needs 2 people assistant to get up from seated position, right lower extremity spasticity circumferential, very unsteady gait   DIAGNOSTIC DATA (LABS, IMAGING, TESTING) - I reviewed patient records, labs, notes, testing and imaging myself where available.   ASSESSMENT AND PLAN  Ruvi Crivelli is a 81 y.o. female    Stroke   New posterior circulation stroke at repeat MRI brain  Most likely embolic stroke,  Change to Asa 81mg  +plavix 75mg   TEE, Cardiac monitoring and cardiology refer  I also consult vascular neurologist Dr. Leonie Man, agree above plan, with her age, fall risk, will not anticoagulate her at this point, unless definite indicate is revealed at her cardiac workup.  Gait difficulty   Multifactorial, this includes recurrent stroke,  cervical spondylitic myelopathy     Mild cognitive impairment  Age-related process, central nervous system degenerative disorder, likely a vascular component.  50 minutes spend in coordinating her care, more than 50% is face to face consultation.  Marcial Pacas, M.D. Ph.D.  [facility] [address] [place], [place] [address] Ph: [phone] Fax: [phone]  CC: Hulan Fess, MD

## 2015-06-14 DIAGNOSIS — R531 Weakness: Secondary | ICD-10-CM | POA: Insufficient documentation

## 2015-06-14 DIAGNOSIS — B9620 Unspecified Escherichia coli [E. coli] as the cause of diseases classified elsewhere: Secondary | ICD-10-CM | POA: Diagnosis not present

## 2015-06-14 DIAGNOSIS — E785 Hyperlipidemia, unspecified: Secondary | ICD-10-CM | POA: Diagnosis not present

## 2015-06-14 DIAGNOSIS — N390 Urinary tract infection, site not specified: Secondary | ICD-10-CM | POA: Diagnosis not present

## 2015-06-14 DIAGNOSIS — J45909 Unspecified asthma, uncomplicated: Secondary | ICD-10-CM | POA: Diagnosis not present

## 2015-06-14 DIAGNOSIS — I69351 Hemiplegia and hemiparesis following cerebral infarction affecting right dominant side: Secondary | ICD-10-CM | POA: Diagnosis not present

## 2015-06-14 NOTE — Telephone Encounter (Signed)
I have called her son, related MRI of the brain did show new stroke in the left posterior circulation in comparison to previous MRI in September, I also discussed the treatment plan with vascular neurologist Dr. Leonie Man, agree aspirin plus Plavix, cardiac monitoring, TEE, cardiology referral,     Abnormal MRI brain (without) demonstrating: 1. There are new subacute infarcts in the left thalamus, left pons and left cerebellum that are new compared to MRI on 04/06/15.  2. Chronic infarcts noted the left antero-medial thalamus, right cerebellum, right pons and left hippocampus. These are stable from MRI on 04/06/15.  3. Mild scattered periventricular and subcortical chronic small vessel ischemic disease. 4. No acute findings.  MRI cervical spine (without) demonstrating: 1. At C4-5: disc bulging and uncovertebral joint hypertrophy and facet hypertrophy with mild spinal stenosis and severe biforaminal stenosis 2. At C3-4: disc bulging and uncovertebral joint hypertrophy and facet hypertrophy with severe biforaminal stenosis 3. At C7-T1: disc bulging and facet hypertrophy with severe biforaminal stenosis 4. At C5-6: disc bulging and uncovertebral joint hypertrophy and facet hypertrophy with severe right and moderate-severe left biforaminal stenosis 5. At T1-2: disc bulging and facet hypertrophy with moderate biforaminal stenosis

## 2015-06-15 ENCOUNTER — Ambulatory Visit (HOSPITAL_BASED_OUTPATIENT_CLINIC_OR_DEPARTMENT_OTHER): Payer: Medicare Other | Admitting: Physical Medicine & Rehabilitation

## 2015-06-15 ENCOUNTER — Encounter: Payer: Medicare Other | Attending: Physical Medicine & Rehabilitation

## 2015-06-15 ENCOUNTER — Encounter: Payer: Self-pay | Admitting: Physical Medicine & Rehabilitation

## 2015-06-15 VITALS — BP 117/62 | HR 67 | Resp 14

## 2015-06-15 DIAGNOSIS — I517 Cardiomegaly: Secondary | ICD-10-CM | POA: Diagnosis not present

## 2015-06-15 DIAGNOSIS — M5126 Other intervertebral disc displacement, lumbar region: Secondary | ICD-10-CM | POA: Diagnosis not present

## 2015-06-15 DIAGNOSIS — M25511 Pain in right shoulder: Secondary | ICD-10-CM

## 2015-06-15 DIAGNOSIS — R269 Unspecified abnormalities of gait and mobility: Secondary | ICD-10-CM | POA: Insufficient documentation

## 2015-06-15 DIAGNOSIS — I69993 Ataxia following unspecified cerebrovascular disease: Secondary | ICD-10-CM

## 2015-06-15 DIAGNOSIS — I69398 Other sequelae of cerebral infarction: Secondary | ICD-10-CM | POA: Insufficient documentation

## 2015-06-15 DIAGNOSIS — J45909 Unspecified asthma, uncomplicated: Secondary | ICD-10-CM | POA: Insufficient documentation

## 2015-06-15 DIAGNOSIS — M7501 Adhesive capsulitis of right shoulder: Secondary | ICD-10-CM | POA: Diagnosis not present

## 2015-06-15 DIAGNOSIS — I639 Cerebral infarction, unspecified: Secondary | ICD-10-CM | POA: Diagnosis not present

## 2015-06-15 DIAGNOSIS — I872 Venous insufficiency (chronic) (peripheral): Secondary | ICD-10-CM | POA: Insufficient documentation

## 2015-06-15 DIAGNOSIS — G8191 Hemiplegia, unspecified affecting right dominant side: Secondary | ICD-10-CM | POA: Diagnosis not present

## 2015-06-15 DIAGNOSIS — R270 Ataxia, unspecified: Secondary | ICD-10-CM | POA: Insufficient documentation

## 2015-06-15 DIAGNOSIS — E785 Hyperlipidemia, unspecified: Secondary | ICD-10-CM | POA: Diagnosis not present

## 2015-06-15 DIAGNOSIS — N390 Urinary tract infection, site not specified: Secondary | ICD-10-CM | POA: Diagnosis not present

## 2015-06-15 DIAGNOSIS — R413 Other amnesia: Secondary | ICD-10-CM | POA: Insufficient documentation

## 2015-06-15 DIAGNOSIS — B9620 Unspecified Escherichia coli [E. coli] as the cause of diseases classified elsewhere: Secondary | ICD-10-CM | POA: Diagnosis not present

## 2015-06-15 DIAGNOSIS — I69351 Hemiplegia and hemiparesis following cerebral infarction affecting right dominant side: Secondary | ICD-10-CM | POA: Diagnosis not present

## 2015-06-15 NOTE — Progress Notes (Signed)
Subjective:    Patient ID: Darlene Murray, female    DOB: [birth year], 81 y.o.   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 She underwent inpatient rehabilitation at [facility]. She was rehospitalized in October for UTI. CT scan at that time demonstrated no new stroke. Has had additional imaging studies, 06/14/2015 cervical spine MRI demonstrating severe bilateral stenosis C4-5 and right C5-C6.  Patient denies any numbness in the shoulder she just has weakness. She thinks the weakness started with the stroke in September. She's had shoulder pain noted at last visit and restriction in range of motion. Patient has followed up with Dr. Krista Blue from neurology, Ordered a cervical MRI and brain MRI Pain Inventory Average Pain 0 Pain Right Now 0 My pain is no pain  In the last 24 hours, has pain interfered with the following? General activity 0 Relation with others 0 Enjoyment of life 0 What TIME of day is your pain at its worst? no pain Sleep (in general) Good  Pain is worse with: no pain Pain improves with: no pain Relief from Meds: no pain  Mobility walk with assistance use a walker how many minutes can you walk? 5 ability to climb steps?  no do you drive?  no use a wheelchair needs help with transfers  Function retired I need assistance with the following:  dressing, bathing and toileting  Neuro/Psych weakness trouble walking dizziness confusion depression  Prior Studies Any changes since last visit?  no STUDY DATE: 06/14/15 PATIENT NAME: Darlene Murray DOB: May 20, 1922 MRN: [id number]  ORDERING CLINICIAN: Marcial Pacas, MD PhD  CLINICAL HISTORY: 81 year old female with gait difficulty.  EXAM: MRI cervical spine (without)  TECHNIQUE: MRI of the cervical spine was obtained utilizing 3 mm sagittal slices from the posterior fossa down to the T3-4 level with T1, T2 and inversion recovery views. In addition 4 mm axial slices  from [id number] down to T1-2 level were included with T2 and gradient echo views. CONTRAST: no IMAGING SITE: [facility] [address] (1.5 Tesla MRI)   FINDINGS:  On sagittal views the vertebral bodies have normal height and alignment. Degenerative spondylosis and disc bulging from C3-4 down to T3-4. Slight posterior subluxation of C4 on C5 measuring 3 mm. The spinal cord is normal in size and appearance. The posterior fossa, pituitary gland and paraspinal soft tissues are unremarkable.   On axial views: C2-3: no spinal stenosis or foraminal narrowing  C3-4: disc bulging and uncovertebral joint hypertrophy and facet hypertrophy with severe biforaminal stenosis C4-5: disc bulging and uncovertebral joint hypertrophy and facet hypertrophy with mild spinal stenosis and severe biforaminal stenosis C5-6: disc bulging and uncovertebral joint hypertrophy and facet hypertrophy with severe right and moderate-severe left biforaminal stenosis C6-7: disc bulging with no spinal stenosis or foraminal narrowing  C7-T1: disc bulging and facet hypertrophy with severe biforaminal stenosis T1-2: disc bulging and facet hypertrophy with moderate biforaminal stenosis  Limited views of the soft tissues of the head and neck are unremarkable.   IMPRESSION:  Abnormal MRI cervical spine (without) demonstrating: 1. At C4-5: disc bulging and uncovertebral joint hypertrophy and facet hypertrophy with mild spinal stenosis and severe biforaminal stenosis 2. At C3-4: disc bulging and uncovertebral joint hypertrophy and facet hypertrophy with severe biforaminal stenosis 3. At C7-T1: disc bulging and facet hypertrophy with severe biforaminal stenosis 4. At C5-6: disc bulging and uncovertebral joint hypertrophy and facet hypertrophy with severe right and moderate-severe left biforaminal stenosis 5.  At T1-2: disc bulging and facet hypertrophy with moderate biforaminal stenosis    INTERPRETING PHYSICIAN:    Penni Bombard, MD  STUDY DATE: 06/12/15 PATIENT NAME: Darlene Murray DOB: Jan 16, 1922 MRN: [id number]  ORDERING CLINICIAN: Marcial Pacas, MD PhD  CLINICAL HISTORY: 81 year old female with gait difficulty and stroke in September 2016.  EXAM: MRI brain (without)  TECHNIQUE: MRI of the brain without contrast was obtained utilizing 5 mm axial slices with T1, T2, T2 flair, SWI and diffusion weighted views. T1 sagittal and T2 coronal views were obtained. CONTRAST: no IMAGING SITE: [facility] [address] (1.5 Tesla MRI)   FINDINGS:  No abnormal lesions are seen on diffusion-weighted views to suggest acute ischemia. There are new subacute infarcts (DWI hyperintense, T2FLAIR hyperintense, ADC iso- and hyper- intense) in the left thalamus, left pons and left cerebellum that are new compared to MRI on 04/06/15. Chronic infarcts noted the left antero-medial thalamus, right cerebellum, right pons and left hippocampus. Mild scattered periventricular and subcortical chronic small vessel ischemic disease. The cortical sulci, fissures and cisterns are normal in size and appearance. Lateral, third and fourth ventricle are normal in size and appearance. No extra-axial fluid collections are seen. No evidence of mass effect or midline shift.   On sagittal views the posterior fossa, pituitary gland and corpus callosum are notable for partially empty sella. No evidence of intracranial hemorrhage on SWI views. The orbits and their contents, paranasal sinuses and calvarium are unremarkable. Intracranial flow voids are present.   IMPRESSION:  Abnormal MRI brain (without) demonstrating: 1. There are new subacute infarcts in the left thalamus, left pons and left cerebellum that are new compared to MRI on 04/06/15.  2. Chronic infarcts noted the left antero-medial thalamus, right cerebellum, right pons and left hippocampus. These are stable from MRI on 04/06/15.  3. Mild scattered periventricular  and subcortical chronic small vessel ischemic disease. 4. No acute findings.    INTERPRETING PHYSICIAN:  Physicians involved in your care Any changes since last visit?  no   Family History  Problem Relation Age of Onset  . Heart attack Sister   . Alzheimer's disease Sister   . Asthma Son    Social History   Social History  . Marital Status: Married    Spouse Name: N/A  . Number of Children: 3  . Years of Education: College   Occupational History  . Retired     Pharmacist, hospital   Social History Main Topics  . Smoking status: Passive Smoke Exposure - Never Smoker  . Smokeless tobacco: Never Used     Comment: Prior exposure through husband.   . Alcohol Use: No  . Drug Use: No  . Sexual Activity:    Partners: Female   Other Topics Concern  . None   Social History Narrative   Lives at home with her husband.   Right-handed.   1 cup coffee per day.      [facility]:   Originally from [place]. She has previously lived in [place]. Previously traveled to [place]. No pets currently. No prior bird exposure. Previously worked as a Education officer, museum. No mold or hot tub exposure.    Past Surgical History  Procedure Laterality Date  . Cataract extraction    . Colonoscopy     Past Medical History  Diagnosis Date  . Blindness   . Asthmatic bronchitis   . Asthma   . Fibrocystic breast disease   . Allergic rhinitis   . Hyperlipidemia   .  Venous insufficiency     lower extremities  . HNP (herniated nucleus pulposus), lumbar     L4-L5  . Left ventricular hypertrophy 2008    mild  . Antibiotic-associated diarrhea   . Stroke (cerebrum) (HCC)    BP 117/62 mmHg  Pulse 67  Resp 14  SpO2 99%  Opioid Risk Score:   Fall Risk Score:  `1  Depression screen PHQ 2/9  Depression screen PHQ 2/9 05/17/2015  Decreased Interest 0  Down, Depressed, Hopeless 0  PHQ - 2 Score 0  Altered sleeping 0  Tired, decreased energy 0  Change in appetite 0  Feeling bad or failure about  yourself  0  Trouble concentrating 0  Moving slowly or fidgety/restless 0  Suicidal thoughts 0  PHQ-9 Score 0  Difficult doing work/chores Not difficult at all     Review of Systems  Musculoskeletal: Positive for gait problem.  Neurological: Positive for dizziness and weakness.  Psychiatric/Behavioral: Positive for dysphoric mood. The patient is nervous/anxious.   All other systems reviewed and are negative.      Objective:   Physical Exam  Neurological: She is alert. No sensory deficit. She exhibits normal muscle tone. Coordination abnormal.  Reflex Scores:      Tricep reflexes are 1+ on the right side and 1+ on the left side.      Bicep reflexes are 1+ on the right side and 1+ on the left side.      Brachioradialis reflexes are 1+ on the right side and 1+ on the left side. Sensation intact to light touch bilateral C5 C6 C7 C8 dermatomal distribution    Moderate Dysmetria right finger-nose-finger, bilateral heel-to-shin Minimal dysmetria on left finger-nose-finger Motor strength is 4/5 bilateral biceps triceps grip 3 minus at the right deltoid due to reduced range of motion, 4/5 left deltoid, 4/5 bilateral hip flexor and knee extensor and flexor Intention tremor right upper greater than left upper  Gen. No acute distress Mood and affect are appropriate    Assessment & Plan:  1. Pontocerebellar infarct in the midportion as well as toward the right onset in September 2016. As results she's had ataxia right greater than left upper extremity. She is completed inpatient as well as home health therapy for this. 2.Right shoulder weakness and reduced range of motion. This is likely multifactorial. Certainly this was noted prior to the repeat MRI. She has some evidence of adhesive capsulitis in the shoulder. She has cervical spine lesions that may also correlate with her weakness however she does have bilateral findings on MRI and only unilateral complaints.  In addition she's had some  evidence of new infarct in the left pons which may correlate with the right upper extremity weakness as well. The timing according to the patient is  In September 2016 however she does have cognitive deficits related to her multiple strokes. She will follow-up neurology May benefit from an EMG to look for evidence of denervation Continued therapy  Return to clinic in one month For ultrasound of the shoulder looking for evidence of rotator cuff tear

## 2015-06-15 NOTE — Patient Instructions (Signed)
Patient has shoulder weakness most likely due to second stroke which affected the left side of the brain and can affect the right side of the body.  There is evidence of narrowing of nerve roots in the neck that can also cause shoulder pain but this is likely not a new problem and also was seen on both sides   .Will order outpatient PT and OT  Will do ultrasound of the right shoulder to look for rotator cuff tear next month

## 2015-06-18 DIAGNOSIS — J45909 Unspecified asthma, uncomplicated: Secondary | ICD-10-CM | POA: Diagnosis not present

## 2015-06-18 DIAGNOSIS — N390 Urinary tract infection, site not specified: Secondary | ICD-10-CM | POA: Diagnosis not present

## 2015-06-18 DIAGNOSIS — E785 Hyperlipidemia, unspecified: Secondary | ICD-10-CM | POA: Diagnosis not present

## 2015-06-18 DIAGNOSIS — B9620 Unspecified Escherichia coli [E. coli] as the cause of diseases classified elsewhere: Secondary | ICD-10-CM | POA: Diagnosis not present

## 2015-06-18 DIAGNOSIS — I69351 Hemiplegia and hemiparesis following cerebral infarction affecting right dominant side: Secondary | ICD-10-CM | POA: Diagnosis not present

## 2015-06-19 DIAGNOSIS — J45909 Unspecified asthma, uncomplicated: Secondary | ICD-10-CM | POA: Diagnosis not present

## 2015-06-19 DIAGNOSIS — N390 Urinary tract infection, site not specified: Secondary | ICD-10-CM | POA: Diagnosis not present

## 2015-06-19 DIAGNOSIS — I69351 Hemiplegia and hemiparesis following cerebral infarction affecting right dominant side: Secondary | ICD-10-CM | POA: Diagnosis not present

## 2015-06-19 DIAGNOSIS — B9620 Unspecified Escherichia coli [E. coli] as the cause of diseases classified elsewhere: Secondary | ICD-10-CM | POA: Diagnosis not present

## 2015-06-19 DIAGNOSIS — E785 Hyperlipidemia, unspecified: Secondary | ICD-10-CM | POA: Diagnosis not present

## 2015-06-20 DIAGNOSIS — I69351 Hemiplegia and hemiparesis following cerebral infarction affecting right dominant side: Secondary | ICD-10-CM | POA: Diagnosis not present

## 2015-06-20 DIAGNOSIS — N390 Urinary tract infection, site not specified: Secondary | ICD-10-CM | POA: Diagnosis not present

## 2015-06-20 DIAGNOSIS — E785 Hyperlipidemia, unspecified: Secondary | ICD-10-CM | POA: Diagnosis not present

## 2015-06-20 DIAGNOSIS — J45909 Unspecified asthma, uncomplicated: Secondary | ICD-10-CM | POA: Diagnosis not present

## 2015-06-20 DIAGNOSIS — B9620 Unspecified Escherichia coli [E. coli] as the cause of diseases classified elsewhere: Secondary | ICD-10-CM | POA: Diagnosis not present

## 2015-06-21 DIAGNOSIS — E785 Hyperlipidemia, unspecified: Secondary | ICD-10-CM | POA: Diagnosis not present

## 2015-06-21 DIAGNOSIS — N390 Urinary tract infection, site not specified: Secondary | ICD-10-CM | POA: Diagnosis not present

## 2015-06-21 DIAGNOSIS — I69351 Hemiplegia and hemiparesis following cerebral infarction affecting right dominant side: Secondary | ICD-10-CM | POA: Diagnosis not present

## 2015-06-21 DIAGNOSIS — J45909 Unspecified asthma, uncomplicated: Secondary | ICD-10-CM | POA: Diagnosis not present

## 2015-06-21 DIAGNOSIS — B9620 Unspecified Escherichia coli [E. coli] as the cause of diseases classified elsewhere: Secondary | ICD-10-CM | POA: Diagnosis not present

## 2015-06-22 DIAGNOSIS — N390 Urinary tract infection, site not specified: Secondary | ICD-10-CM | POA: Diagnosis not present

## 2015-06-22 DIAGNOSIS — E785 Hyperlipidemia, unspecified: Secondary | ICD-10-CM | POA: Diagnosis not present

## 2015-06-22 DIAGNOSIS — J45909 Unspecified asthma, uncomplicated: Secondary | ICD-10-CM | POA: Diagnosis not present

## 2015-06-22 DIAGNOSIS — I69351 Hemiplegia and hemiparesis following cerebral infarction affecting right dominant side: Secondary | ICD-10-CM | POA: Diagnosis not present

## 2015-06-22 DIAGNOSIS — B9620 Unspecified Escherichia coli [E. coli] as the cause of diseases classified elsewhere: Secondary | ICD-10-CM | POA: Diagnosis not present

## 2015-06-25 DIAGNOSIS — J45909 Unspecified asthma, uncomplicated: Secondary | ICD-10-CM | POA: Diagnosis not present

## 2015-06-25 DIAGNOSIS — N390 Urinary tract infection, site not specified: Secondary | ICD-10-CM | POA: Diagnosis not present

## 2015-06-25 DIAGNOSIS — E785 Hyperlipidemia, unspecified: Secondary | ICD-10-CM | POA: Diagnosis not present

## 2015-06-25 DIAGNOSIS — I69351 Hemiplegia and hemiparesis following cerebral infarction affecting right dominant side: Secondary | ICD-10-CM | POA: Diagnosis not present

## 2015-06-25 DIAGNOSIS — B9620 Unspecified Escherichia coli [E. coli] as the cause of diseases classified elsewhere: Secondary | ICD-10-CM | POA: Diagnosis not present

## 2015-06-26 DIAGNOSIS — B9620 Unspecified Escherichia coli [E. coli] as the cause of diseases classified elsewhere: Secondary | ICD-10-CM | POA: Diagnosis not present

## 2015-06-26 DIAGNOSIS — N390 Urinary tract infection, site not specified: Secondary | ICD-10-CM | POA: Diagnosis not present

## 2015-06-26 DIAGNOSIS — J45909 Unspecified asthma, uncomplicated: Secondary | ICD-10-CM | POA: Diagnosis not present

## 2015-06-26 DIAGNOSIS — E785 Hyperlipidemia, unspecified: Secondary | ICD-10-CM | POA: Diagnosis not present

## 2015-06-26 DIAGNOSIS — I69351 Hemiplegia and hemiparesis following cerebral infarction affecting right dominant side: Secondary | ICD-10-CM | POA: Diagnosis not present

## 2015-06-27 ENCOUNTER — Ambulatory Visit (INDEPENDENT_AMBULATORY_CARE_PROVIDER_SITE_OTHER): Payer: Medicare Other | Admitting: Physician Assistant

## 2015-06-27 ENCOUNTER — Encounter: Payer: Self-pay | Admitting: Physician Assistant

## 2015-06-27 VITALS — BP 126/64 | HR 66 | Ht 62.0 in | Wt 162.5 lb

## 2015-06-27 DIAGNOSIS — I639 Cerebral infarction, unspecified: Secondary | ICD-10-CM | POA: Diagnosis not present

## 2015-06-27 DIAGNOSIS — E785 Hyperlipidemia, unspecified: Secondary | ICD-10-CM | POA: Diagnosis not present

## 2015-06-27 DIAGNOSIS — B9620 Unspecified Escherichia coli [E. coli] as the cause of diseases classified elsewhere: Secondary | ICD-10-CM | POA: Diagnosis not present

## 2015-06-27 DIAGNOSIS — I63439 Cerebral infarction due to embolism of unspecified posterior cerebral artery: Secondary | ICD-10-CM | POA: Diagnosis not present

## 2015-06-27 DIAGNOSIS — I69351 Hemiplegia and hemiparesis following cerebral infarction affecting right dominant side: Secondary | ICD-10-CM | POA: Diagnosis not present

## 2015-06-27 DIAGNOSIS — I4510 Unspecified right bundle-branch block: Secondary | ICD-10-CM

## 2015-06-27 DIAGNOSIS — I519 Heart disease, unspecified: Secondary | ICD-10-CM

## 2015-06-27 DIAGNOSIS — J45909 Unspecified asthma, uncomplicated: Secondary | ICD-10-CM | POA: Diagnosis not present

## 2015-06-27 DIAGNOSIS — N390 Urinary tract infection, site not specified: Secondary | ICD-10-CM | POA: Diagnosis not present

## 2015-06-27 NOTE — Progress Notes (Signed)
Cardiology Office Note   Date:  06/27/2015   ID:  Darlene Murray, DOB 19-Sep-1921, MRN [id number]  PCP:  Gennette Pac, MD  Cardiologist:  New - See with Dr. Lovena Le DOD Pulmonologist: Dr. Joya Gaskins / Dr. Ashok Cordia Neurologist: Dr. Marcial Pacas  Chief Complaint  Patient presents with  . Cerebrovascular Accident  . New Patient (Initial Visit)    referred by Dr. Krista Blue of Neurology, seen with Dr. Cristopher Peru      History of Present Illness: Darlene Murray is a 81 y.o. female who presents for new patient referral from Dr. Krista Blue neurology regarding workup for recurrent stroke. She has seen Dr. Tamala Julian remotely per husband, although I can not find such record. Patient has a past medical history of severe persistent asthma with frequent exacerbations, hyperlipidemia, and recent CVA. She has been followed by Dr. Ashok Cordia with pulmonology for her asthma, and take low-dose daily prednisone. 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 She was admitted in the hospital in September for acute onset of nausea and vomiting with unsteady gait. MRI of the brain obtained on 04/06/2015 showed left hippocampus hemorrhagic stroke, also moderate nonhemorrhagic stroke involving posterior right upper and mid pons, adjacent right cerebellum, acute tiny nonhemorrhagic left thalamic infarction. The etiology of the stroke was suspicious for embolic event. She was placed on 325 mg aspirin daily. CTA of the head and neck showed no significant large vessel disease. Echocardiogram obtained on 04/07/2015 showed EF 123456, grade 1 diastolic dysfunction, mild AR.   She was admitted in October with urosepsis. She had profound right arm weakness and increased gait difficulty after discharge. Initial CT of head was negative. Her MRI of the brain was repeated on 06/12/2015 which showed new  subacute infarct of left thalamus, left pons, left cerebellum which are new compared to the previous MRI in September 2016. Her high-dose aspirin was changed  to 81 mg aspirin +75 mg Plavix. She denies any bleeding issue while on DAPT. Two weeks ago she did have 2 falls on a single day, however she is unclear if it is mechanical or due to dizziness. Per patient after her first stroke in Sept, she was still able to walk around with a walker. After the second stroke, she has been largely wheelchair bound. She was seen by neurology on 06/14/2015 who referred her to cardiology for consideration of TEE and event monitor. She denies ever having chest pain or palpitation in the past.  Last Lipid Panel 04/07/2015 Chol 161, trig 47, HDL 48, LDL 104 Hgb A1C 6.0    Past Medical History  Diagnosis Date  . Blindness   . Asthmatic bronchitis   . Asthma   . Fibrocystic breast disease   . Allergic rhinitis   . Hyperlipidemia   . Venous insufficiency     lower extremities  . HNP (herniated nucleus pulposus), lumbar     L4-L5  . Left ventricular hypertrophy 2008    mild  . Antibiotic-associated diarrhea   . Stroke (cerebrum) [facility])     Past Surgical History  Procedure Laterality Date  . Cataract extraction    . Colonoscopy       Current Outpatient Prescriptions  Medication Sig Dispense Refill  . albuterol (PROVENTIL HFA;VENTOLIN HFA) 108 (90 BASE) MCG/ACT inhaler Inhale 2 puffs into the lungs every 6 (six) hours as needed for wheezing or shortness of breath. 1 Inhaler 6  . aspirin 81 MG tablet Take 81 mg by mouth daily.    . calcium carbonate (  OS-CAL) 600 MG TABS Take 600 mg by mouth daily.      . cholecalciferol (VITAMIN D) 1000 UNITS tablet Take 1,000 Units by mouth daily.    . clopidogrel (PLAVIX) 75 MG tablet Take 1 tablet (75 mg total) by mouth daily. 30 tablet 11  . Multiple Vitamin (MULTIVITAMIN) tablet Take 1 tablet by mouth daily.    . pravastatin (PRAVACHOL) 20 MG tablet Take 1 tablet (20 mg total) by mouth daily at 6 PM. 90 tablet 3  . predniSONE (DELTASONE) 5 MG tablet Take 1 tablet (5 mg total) by mouth daily. 30 tablet 5   No current  facility-administered medications for this visit.    Allergies:   Review of patient's allergies indicates no known allergies.    Social History:  The patient  reports that she has been passively smoking.  She has never used smokeless tobacco. She reports that she does not drink alcohol or use illicit drugs.   Family History:  The patient's family history includes Alzheimer's disease in her sister; Asthma in her son; Heart attack in her sister.    ROS:  Please see the history of present illness.   Otherwise, review of systems are positive for RUE and RLE weakness.   All other systems are reviewed and negative.    PHYSICAL EXAM: VS:  BP 126/64 mmHg  Pulse 66  Ht 5\' 2"  (1.575 m)  Wt 162 lb 8 oz (73.71 kg)  BMI 29.71 kg/m2 , BMI Body mass index is 29.71 kg/(m^2). GEN: Well nourished, well developed, in no acute distress. In wheelchair, pleasant, communicating HEENT: normal, R eye blind. Neck: no JVD, carotid bruits, or masses Cardiac: RRR; no murmurs, rubs, or gallops,no edema  Respiratory:  clear to auscultation bilaterally, normal work of breathing GI: soft, nontender, nondistended, + BS MS: no deformity or atrophy Skin: warm and dry, no rash Neuro:  Strength and sensation are intact. RUE weak, RLE slightly weaker than L side. Psych: euthymic mood, full affect   EKG:  EKG is ordered today. The ekg ordered today demonstrates sinus rhythm with RBBB   Recent Labs: 12/16/2014: B Natriuretic Peptide 14.1 04/26/2015: ALT 15; Hemoglobin 12.3; Platelets 284 04/27/2015: BUN 9; Creatinine, Ser 0.88; Potassium 3.3*; Sodium 144    Lipid Panel    Component Value Date/Time   CHOL 161 04/07/2015 0622   TRIG 47 04/07/2015 0622   HDL 48 04/07/2015 0622   CHOLHDL 3.4 04/07/2015 0622   VLDL 9 04/07/2015 0622   LDLCALC 104* 04/07/2015 0622      Wt Readings from Last 3 Encounters:  06/27/15 162 lb 8 oz (73.71 kg)  06/12/15 168 lb (76.204 kg)  06/12/15 168 lb (76.204 kg)       Other studies Reviewed: Additional studies/ records that were reviewed today include:   Echo 04/07/2015 LV EF: 60% -  65%  ------------------------------------------------------------------- Indications:   CVA 436.  ------------------------------------------------------------------- History:  Risk factors: Dyslipidemia.  ------------------------------------------------------------------- Study Conclusions  - Left ventricle: The cavity size was normal. Systolic function was normal. The estimated ejection fraction was in the range of 60% to 65%. Wall motion was normal; there were no regional wall motion abnormalities. There was an increased relative contribution of atrial contraction to ventricular filling. Doppler parameters are consistent with abnormal left ventricular relaxation (grade 1 diastolic dysfunction). - Aortic valve: There was mild regurgitation. - Mitral valve: There was trivial regurgitation.   CTA of head and neck 04/07/2015 CTA NECK  Ectatic 3 vessel aortic arch with plaque.  Plaque right carotid bifurcation with 52% diameter stenosis proximal right internal carotid artery.  No significant stenosis left carotid bifurcation or proximal left internal carotid artery.  Plaque with mild narrowing proximal right vertebral artery.  Mild narrowing proximal left vertebral artery. Moderate narrowing left vertebral artery C3-4 level.  CTA HEAD  Internal carotid artery cavernous segment calcification with mild narrowing bilaterally.  Anterior circulation without medium or large size vessel significant stenosis or occlusion.  Right vertebral artery is dominant and mildly ectatic.  Narrowing distal left vertebral artery.  Ectatic basilar artery with mild narrowing but without high-grade stenosis.  No significant stenosis of the proximal posterior cerebral arteries. Fetal type contribution on right. Narrowing distal aspect of  the posterior cerebral arteries bilaterally.    MR of Neck 06/12/2015 IMPRESSION:  Abnormal MRI cervical spine (without) demonstrating: 1. At C4-5: disc bulging and uncovertebral joint hypertrophy and facet hypertrophy with mild spinal stenosis and severe biforaminal stenosis 2. At C3-4: disc bulging and uncovertebral joint hypertrophy and facet hypertrophy with severe biforaminal stenosis 3. At C7-T1: disc bulging and facet hypertrophy with severe biforaminal stenosis 4. At C5-6: disc bulging and uncovertebral joint hypertrophy and facet hypertrophy with severe right and moderate-severe left biforaminal stenosis 5. At T1-2: disc bulging and facet hypertrophy with moderate biforaminal stenosis   MR of Brain 06/12/2015 IMPRESSION:  Abnormal MRI brain (without) demonstrating: 1. There are new subacute infarcts in the left thalamus, left pons and left cerebellum that are new compared to MRI on 04/06/15.  2. Chronic infarcts noted the left antero-medial thalamus, right cerebellum, right pons and left hippocampus. These are stable from MRI on 04/06/15.  3. Mild scattered periventricular and subcortical chronic small vessel ischemic disease. 4. No acute findings.     Review of the above records demonstrates:  Recent stroke x 2 in Sep and Oct, likely embolic. Sept MRI showed mixed hemorrhagic and nonhemorrhagic stroke. Recently started on ASA and plavix.     ASSESSMENT AND PLAN:  1.  Recurrent embolic stroke: patient was seen with DOD Dr. Lovena Le, we had long discussion with her. She has mixed hemorrhagic and nonhemorrhagic stroke in Sept and recurrent embolic stroke after Urosepsis admission in Oct. Under normal circumstances, would consider TEE +/- loop recorder. Event monitor would not be beneficial. However in her case, her advanced age, risk for fall and h/o hemorrhagic stroke make systemic anticoagulation less appealing. Fortunately, she is not having any bleeding issues on ASA and  plavix, but coumadin or NOAC may increase bleeding risk. She did have 2 falls on a single day 2 ago, the etiology is unclear. We did discuss TEE and possible loop recorder with patient, she is currently not interested in either. If she ever notice her HR suddenly increase at rest.   2. Severe asthma with frequent exacerbation: on low dose prednisone at home  3. HLD: continue pravachol   4. RBBB unknown duration noted on EKG today, she was unaware of RBBB history, based on EPIC, she has EKG showing RBBB since Jun 2016. She never had CP. This is likely chronic and a nonspecific finding.    Current medicines are reviewed at length with the patient today.  The patient does not have concerns regarding medicines.  The following changes have been made:  no change  Labs/ tests ordered today include:   Orders Placed This Encounter  Procedures  . EKG 12-Lead     Disposition:   FU with Dr. Lovena Le as need if has atrial fibrillation  Signed,  Almyra Deforest PA 06/27/2015 4:32 PM    [facility] [address], [place], [place]  [address] Phone: [phone]; Fax: [phone]   EP Attending  Patient seen and examined. Agree with above.   Mikle Bosworth.D.

## 2015-06-27 NOTE — Patient Instructions (Signed)
Medication Instructions:  Your physician recommends that you continue on your current medications as directed. Please refer to the Current Medication list given to you today.   Labwork: None ordered  Testing/Procedures: None ordered  Follow-Up: Your physician recommends that you schedule a follow-up appointment in: PRN ONLY   Any Other Special Instructions Will Be Listed Below (If Applicable).  1. Continue to follow-up with your Neurologist. 2. Continue Physical Therapy    If you need a refill on your cardiac medications before your next appointment, please call your pharmacy.  /

## 2015-06-28 DIAGNOSIS — I69351 Hemiplegia and hemiparesis following cerebral infarction affecting right dominant side: Secondary | ICD-10-CM | POA: Diagnosis not present

## 2015-06-28 DIAGNOSIS — N390 Urinary tract infection, site not specified: Secondary | ICD-10-CM | POA: Diagnosis not present

## 2015-06-28 DIAGNOSIS — E785 Hyperlipidemia, unspecified: Secondary | ICD-10-CM | POA: Diagnosis not present

## 2015-06-28 DIAGNOSIS — J45909 Unspecified asthma, uncomplicated: Secondary | ICD-10-CM | POA: Diagnosis not present

## 2015-06-28 DIAGNOSIS — B9620 Unspecified Escherichia coli [E. coli] as the cause of diseases classified elsewhere: Secondary | ICD-10-CM | POA: Diagnosis not present

## 2015-06-29 DIAGNOSIS — I69351 Hemiplegia and hemiparesis following cerebral infarction affecting right dominant side: Secondary | ICD-10-CM | POA: Diagnosis not present

## 2015-06-29 DIAGNOSIS — B9620 Unspecified Escherichia coli [E. coli] as the cause of diseases classified elsewhere: Secondary | ICD-10-CM | POA: Diagnosis not present

## 2015-06-29 DIAGNOSIS — N390 Urinary tract infection, site not specified: Secondary | ICD-10-CM | POA: Diagnosis not present

## 2015-06-29 DIAGNOSIS — E785 Hyperlipidemia, unspecified: Secondary | ICD-10-CM | POA: Diagnosis not present

## 2015-06-29 DIAGNOSIS — J45909 Unspecified asthma, uncomplicated: Secondary | ICD-10-CM | POA: Diagnosis not present

## 2015-07-02 DIAGNOSIS — E785 Hyperlipidemia, unspecified: Secondary | ICD-10-CM | POA: Diagnosis not present

## 2015-07-02 DIAGNOSIS — J45909 Unspecified asthma, uncomplicated: Secondary | ICD-10-CM | POA: Diagnosis not present

## 2015-07-02 DIAGNOSIS — B9620 Unspecified Escherichia coli [E. coli] as the cause of diseases classified elsewhere: Secondary | ICD-10-CM | POA: Diagnosis not present

## 2015-07-02 DIAGNOSIS — I69351 Hemiplegia and hemiparesis following cerebral infarction affecting right dominant side: Secondary | ICD-10-CM | POA: Diagnosis not present

## 2015-07-02 DIAGNOSIS — N390 Urinary tract infection, site not specified: Secondary | ICD-10-CM | POA: Diagnosis not present

## 2015-07-03 ENCOUNTER — Telehealth: Payer: Self-pay | Admitting: Pulmonary Disease

## 2015-07-03 DIAGNOSIS — B9620 Unspecified Escherichia coli [E. coli] as the cause of diseases classified elsewhere: Secondary | ICD-10-CM | POA: Diagnosis not present

## 2015-07-03 DIAGNOSIS — J45909 Unspecified asthma, uncomplicated: Secondary | ICD-10-CM | POA: Diagnosis not present

## 2015-07-03 DIAGNOSIS — I69351 Hemiplegia and hemiparesis following cerebral infarction affecting right dominant side: Secondary | ICD-10-CM | POA: Diagnosis not present

## 2015-07-03 DIAGNOSIS — E785 Hyperlipidemia, unspecified: Secondary | ICD-10-CM | POA: Diagnosis not present

## 2015-07-03 DIAGNOSIS — N390 Urinary tract infection, site not specified: Secondary | ICD-10-CM | POA: Diagnosis not present

## 2015-07-03 MED ORDER — PREDNISONE 10 MG PO TABS: ORAL_TABLET | ORAL | Status: DC

## 2015-07-03 MED ORDER — DOXYCYCLINE HYCLATE 100 MG PO TABS: 100.0000 mg | ORAL_TABLET | Freq: Two times a day (BID) | ORAL | Status: DC

## 2015-07-03 NOTE — Telephone Encounter (Signed)
Called and spoke with Loop. Informed her of JN's recs and verified pharmacy as [facility] in [place]. Patti voiced understanding and had no further questions. Prednisone and Doxycycline sent to pharmacy. Nothing further needed.

## 2015-07-03 NOTE — Telephone Encounter (Signed)
Prednisone:  40mg  daily x4 days then return to basal Prednisone of 5mg  daily as long as she is better.  Antibiotic:  Doxycycline 100mg  - 1 tab po bid x7 days. No refills.   Remember to caution patient not to take Doxycycline within 1 hour of dairy products; take with a full glass of water & remain upright for 1 hour after, and avoid excessive sunlight.  She should call us if no improvement in her cough in a couple of days. Thanks.

## 2015-07-03 NOTE — Telephone Encounter (Signed)
Last seen on 06/07/15 with PM  Patient Instructions       We will start you on prednisone taper starting at 40 mg. Reduce by 10 mg every 2 days until you reach your usual dose of 5 mg. If you develop fevers or chills or cough worsens or sputum changes color please let us know. He may need a chest x-ray and antibiotics in addition to the steroids   Called and spoke with Precious Bard at [facility]. She states she was visiting the patient and states she is experiencing wheezing, prod cough with yellow mucus, and chest congestion x 5 days. SOB and cough seem to worsen at bedtime. Denies any sinus pressure/drainage, fever, nausea or vomiting. Patient is currently at 5mg  of Prednisone daily. I explained to Precious Bard I would send a message to [facility] and return her call with his recs. She voiced understanding and had no further questions.  JN please advise

## 2015-07-04 DIAGNOSIS — N390 Urinary tract infection, site not specified: Secondary | ICD-10-CM | POA: Diagnosis not present

## 2015-07-04 DIAGNOSIS — J45909 Unspecified asthma, uncomplicated: Secondary | ICD-10-CM | POA: Diagnosis not present

## 2015-07-04 DIAGNOSIS — E785 Hyperlipidemia, unspecified: Secondary | ICD-10-CM | POA: Diagnosis not present

## 2015-07-04 DIAGNOSIS — B9620 Unspecified Escherichia coli [E. coli] as the cause of diseases classified elsewhere: Secondary | ICD-10-CM | POA: Diagnosis not present

## 2015-07-04 DIAGNOSIS — I69351 Hemiplegia and hemiparesis following cerebral infarction affecting right dominant side: Secondary | ICD-10-CM | POA: Diagnosis not present

## 2015-07-05 DIAGNOSIS — N390 Urinary tract infection, site not specified: Secondary | ICD-10-CM | POA: Diagnosis not present

## 2015-07-05 DIAGNOSIS — J45909 Unspecified asthma, uncomplicated: Secondary | ICD-10-CM | POA: Diagnosis not present

## 2015-07-05 DIAGNOSIS — B9620 Unspecified Escherichia coli [E. coli] as the cause of diseases classified elsewhere: Secondary | ICD-10-CM | POA: Diagnosis not present

## 2015-07-05 DIAGNOSIS — I69351 Hemiplegia and hemiparesis following cerebral infarction affecting right dominant side: Secondary | ICD-10-CM | POA: Diagnosis not present

## 2015-07-05 DIAGNOSIS — E785 Hyperlipidemia, unspecified: Secondary | ICD-10-CM | POA: Diagnosis not present

## 2015-07-09 DIAGNOSIS — N390 Urinary tract infection, site not specified: Secondary | ICD-10-CM | POA: Diagnosis not present

## 2015-07-09 DIAGNOSIS — E785 Hyperlipidemia, unspecified: Secondary | ICD-10-CM | POA: Diagnosis not present

## 2015-07-09 DIAGNOSIS — J45909 Unspecified asthma, uncomplicated: Secondary | ICD-10-CM | POA: Diagnosis not present

## 2015-07-09 DIAGNOSIS — I69351 Hemiplegia and hemiparesis following cerebral infarction affecting right dominant side: Secondary | ICD-10-CM | POA: Diagnosis not present

## 2015-07-09 DIAGNOSIS — B9620 Unspecified Escherichia coli [E. coli] as the cause of diseases classified elsewhere: Secondary | ICD-10-CM | POA: Diagnosis not present

## 2015-07-10 DIAGNOSIS — I69351 Hemiplegia and hemiparesis following cerebral infarction affecting right dominant side: Secondary | ICD-10-CM | POA: Diagnosis not present

## 2015-07-10 DIAGNOSIS — B9620 Unspecified Escherichia coli [E. coli] as the cause of diseases classified elsewhere: Secondary | ICD-10-CM | POA: Diagnosis not present

## 2015-07-10 DIAGNOSIS — J45909 Unspecified asthma, uncomplicated: Secondary | ICD-10-CM | POA: Diagnosis not present

## 2015-07-10 DIAGNOSIS — N390 Urinary tract infection, site not specified: Secondary | ICD-10-CM | POA: Diagnosis not present

## 2015-07-10 DIAGNOSIS — E785 Hyperlipidemia, unspecified: Secondary | ICD-10-CM | POA: Diagnosis not present

## 2015-07-11 ENCOUNTER — Ambulatory Visit (INDEPENDENT_AMBULATORY_CARE_PROVIDER_SITE_OTHER): Payer: Medicare Other | Admitting: Podiatry

## 2015-07-11 ENCOUNTER — Encounter: Payer: Self-pay | Admitting: Podiatry

## 2015-07-11 ENCOUNTER — Ambulatory Visit: Payer: Medicare Other | Admitting: Podiatry

## 2015-07-11 VITALS — BP 120/65 | HR 66 | Resp 14

## 2015-07-11 DIAGNOSIS — M79676 Pain in unspecified toe(s): Secondary | ICD-10-CM | POA: Diagnosis not present

## 2015-07-11 DIAGNOSIS — N390 Urinary tract infection, site not specified: Secondary | ICD-10-CM | POA: Diagnosis not present

## 2015-07-11 DIAGNOSIS — B351 Tinea unguium: Secondary | ICD-10-CM

## 2015-07-11 DIAGNOSIS — I69351 Hemiplegia and hemiparesis following cerebral infarction affecting right dominant side: Secondary | ICD-10-CM | POA: Diagnosis not present

## 2015-07-11 DIAGNOSIS — E785 Hyperlipidemia, unspecified: Secondary | ICD-10-CM | POA: Diagnosis not present

## 2015-07-11 DIAGNOSIS — B9620 Unspecified Escherichia coli [E. coli] as the cause of diseases classified elsewhere: Secondary | ICD-10-CM | POA: Diagnosis not present

## 2015-07-11 DIAGNOSIS — J45909 Unspecified asthma, uncomplicated: Secondary | ICD-10-CM | POA: Diagnosis not present

## 2015-07-11 NOTE — Progress Notes (Signed)
   Subjective:    Patient ID: Darlene Murray, female    DOB: 1922-01-04, 82 y.o.   MRN: [id number]  HPIThis patient presents to the office with long thick incurvated nails.  She has history of CVA and is in a wheelchair.  She says her nails are painful wearing her shoes.  She presents for preventive foot care services.  The patient presents here today for  Toenail trim.   Review of Systems  All other systems reviewed and are negative.      Objective:   Physical Exam GENERAL APPEARANCE: Alert, conversant. Appropriately groomed. No acute distress.  VASCULAR: Pedal pulses palpable at  [facility] and PT bilateral.  Capillary refill time is immediate to all digits,  Normal temperature gradient.  Digital hair growth is present bilateral  NEUROLOGIC: sensation is normal to 5.07 monofilament at 5/5 sites bilateral.  Light touch is intact bilateral, Muscle strength normal.  MUSCULOSKELETAL: acceptable muscle strength, tone and stability bilateral.  Intrinsic muscluature intact bilateral.  Rectus appearance of foot and digits noted bilateral.   DERMATOLOGIC: skin color, texture, and turgor are within normal limits.  No preulcerative lesions or ulcers  are seen, no interdigital maceration noted.  No open lesions present.  . No drainage noted.  NAILS  Thick disfigured pincer toenails both feet.         Assessment & Plan:  Onychomycosis  B/L  Debridement of nails.  RTC 10 weeks.   Gardiner Barefoot DPM

## 2015-07-13 DIAGNOSIS — I69351 Hemiplegia and hemiparesis following cerebral infarction affecting right dominant side: Secondary | ICD-10-CM | POA: Diagnosis not present

## 2015-07-13 DIAGNOSIS — B9620 Unspecified Escherichia coli [E. coli] as the cause of diseases classified elsewhere: Secondary | ICD-10-CM | POA: Diagnosis not present

## 2015-07-13 DIAGNOSIS — N390 Urinary tract infection, site not specified: Secondary | ICD-10-CM | POA: Diagnosis not present

## 2015-07-13 DIAGNOSIS — J45909 Unspecified asthma, uncomplicated: Secondary | ICD-10-CM | POA: Diagnosis not present

## 2015-07-13 DIAGNOSIS — E785 Hyperlipidemia, unspecified: Secondary | ICD-10-CM | POA: Diagnosis not present

## 2015-07-16 DIAGNOSIS — E785 Hyperlipidemia, unspecified: Secondary | ICD-10-CM | POA: Diagnosis not present

## 2015-07-16 DIAGNOSIS — J45909 Unspecified asthma, uncomplicated: Secondary | ICD-10-CM | POA: Diagnosis not present

## 2015-07-16 DIAGNOSIS — B9620 Unspecified Escherichia coli [E. coli] as the cause of diseases classified elsewhere: Secondary | ICD-10-CM | POA: Diagnosis not present

## 2015-07-16 DIAGNOSIS — N390 Urinary tract infection, site not specified: Secondary | ICD-10-CM | POA: Diagnosis not present

## 2015-07-16 DIAGNOSIS — I69351 Hemiplegia and hemiparesis following cerebral infarction affecting right dominant side: Secondary | ICD-10-CM | POA: Diagnosis not present

## 2015-07-17 DIAGNOSIS — J45909 Unspecified asthma, uncomplicated: Secondary | ICD-10-CM | POA: Diagnosis not present

## 2015-07-17 DIAGNOSIS — E785 Hyperlipidemia, unspecified: Secondary | ICD-10-CM | POA: Diagnosis not present

## 2015-07-17 DIAGNOSIS — N390 Urinary tract infection, site not specified: Secondary | ICD-10-CM | POA: Diagnosis not present

## 2015-07-17 DIAGNOSIS — I69351 Hemiplegia and hemiparesis following cerebral infarction affecting right dominant side: Secondary | ICD-10-CM | POA: Diagnosis not present

## 2015-07-17 DIAGNOSIS — B9620 Unspecified Escherichia coli [E. coli] as the cause of diseases classified elsewhere: Secondary | ICD-10-CM | POA: Diagnosis not present

## 2015-07-18 DIAGNOSIS — B9620 Unspecified Escherichia coli [E. coli] as the cause of diseases classified elsewhere: Secondary | ICD-10-CM | POA: Diagnosis not present

## 2015-07-18 DIAGNOSIS — N390 Urinary tract infection, site not specified: Secondary | ICD-10-CM | POA: Diagnosis not present

## 2015-07-18 DIAGNOSIS — J45909 Unspecified asthma, uncomplicated: Secondary | ICD-10-CM | POA: Diagnosis not present

## 2015-07-18 DIAGNOSIS — I69351 Hemiplegia and hemiparesis following cerebral infarction affecting right dominant side: Secondary | ICD-10-CM | POA: Diagnosis not present

## 2015-07-18 DIAGNOSIS — E785 Hyperlipidemia, unspecified: Secondary | ICD-10-CM | POA: Diagnosis not present

## 2015-07-19 DIAGNOSIS — J45909 Unspecified asthma, uncomplicated: Secondary | ICD-10-CM | POA: Diagnosis not present

## 2015-07-19 DIAGNOSIS — I69351 Hemiplegia and hemiparesis following cerebral infarction affecting right dominant side: Secondary | ICD-10-CM | POA: Diagnosis not present

## 2015-07-19 DIAGNOSIS — N390 Urinary tract infection, site not specified: Secondary | ICD-10-CM | POA: Diagnosis not present

## 2015-07-19 DIAGNOSIS — B9620 Unspecified Escherichia coli [E. coli] as the cause of diseases classified elsewhere: Secondary | ICD-10-CM | POA: Diagnosis not present

## 2015-07-19 DIAGNOSIS — E785 Hyperlipidemia, unspecified: Secondary | ICD-10-CM | POA: Diagnosis not present

## 2015-07-20 DIAGNOSIS — B9620 Unspecified Escherichia coli [E. coli] as the cause of diseases classified elsewhere: Secondary | ICD-10-CM | POA: Diagnosis not present

## 2015-07-20 DIAGNOSIS — I69351 Hemiplegia and hemiparesis following cerebral infarction affecting right dominant side: Secondary | ICD-10-CM | POA: Diagnosis not present

## 2015-07-20 DIAGNOSIS — E785 Hyperlipidemia, unspecified: Secondary | ICD-10-CM | POA: Diagnosis not present

## 2015-07-20 DIAGNOSIS — N390 Urinary tract infection, site not specified: Secondary | ICD-10-CM | POA: Diagnosis not present

## 2015-07-20 DIAGNOSIS — J45909 Unspecified asthma, uncomplicated: Secondary | ICD-10-CM | POA: Diagnosis not present

## 2015-07-23 ENCOUNTER — Encounter: Payer: Medicare Other | Attending: Physical Medicine & Rehabilitation

## 2015-07-23 ENCOUNTER — Ambulatory Visit (HOSPITAL_BASED_OUTPATIENT_CLINIC_OR_DEPARTMENT_OTHER): Payer: Medicare Other | Admitting: Physical Medicine & Rehabilitation

## 2015-07-23 ENCOUNTER — Encounter: Payer: Self-pay | Admitting: Physical Medicine & Rehabilitation

## 2015-07-23 VITALS — BP 121/59 | HR 69 | Resp 14

## 2015-07-23 DIAGNOSIS — E785 Hyperlipidemia, unspecified: Secondary | ICD-10-CM | POA: Insufficient documentation

## 2015-07-23 DIAGNOSIS — G8929 Other chronic pain: Secondary | ICD-10-CM

## 2015-07-23 DIAGNOSIS — M5126 Other intervertebral disc displacement, lumbar region: Secondary | ICD-10-CM | POA: Diagnosis not present

## 2015-07-23 DIAGNOSIS — I872 Venous insufficiency (chronic) (peripheral): Secondary | ICD-10-CM | POA: Diagnosis not present

## 2015-07-23 DIAGNOSIS — I517 Cardiomegaly: Secondary | ICD-10-CM | POA: Insufficient documentation

## 2015-07-23 DIAGNOSIS — I69398 Other sequelae of cerebral infarction: Secondary | ICD-10-CM | POA: Insufficient documentation

## 2015-07-23 DIAGNOSIS — R270 Ataxia, unspecified: Secondary | ICD-10-CM | POA: Diagnosis not present

## 2015-07-23 DIAGNOSIS — M25511 Pain in right shoulder: Secondary | ICD-10-CM | POA: Diagnosis not present

## 2015-07-23 DIAGNOSIS — J45909 Unspecified asthma, uncomplicated: Secondary | ICD-10-CM | POA: Insufficient documentation

## 2015-07-23 DIAGNOSIS — N390 Urinary tract infection, site not specified: Secondary | ICD-10-CM | POA: Diagnosis not present

## 2015-07-23 DIAGNOSIS — I69351 Hemiplegia and hemiparesis following cerebral infarction affecting right dominant side: Secondary | ICD-10-CM | POA: Diagnosis not present

## 2015-07-23 DIAGNOSIS — R269 Unspecified abnormalities of gait and mobility: Secondary | ICD-10-CM | POA: Insufficient documentation

## 2015-07-23 DIAGNOSIS — B9620 Unspecified Escherichia coli [E. coli] as the cause of diseases classified elsewhere: Secondary | ICD-10-CM | POA: Diagnosis not present

## 2015-07-23 MED ORDER — DICLOFENAC SODIUM 1 % TD GEL: 2.0000 g | Freq: Four times a day (QID) | TRANSDERMAL | Status: DC

## 2015-07-23 NOTE — Patient Instructions (Signed)
Shoulder pain is likely due to several causes.  Patient has evidence of right biceps tendon fluid and inflammation and possibly a partial tear  There is also arthritis along the humerus where the supraspinatus inserts  There is pain and arthritis in the right acromial clavicular joint  In addition the MRI of the cervical spine showed evidence of  Pinched nerves affecting the C5 And C6  nerve root

## 2015-07-23 NOTE — Progress Notes (Signed)
Complete diagnostic ultrasound of the right shoulder  Indication shoulder pain post stroke  12 Hz linear transducer utilized  Short axis view bicipital groove demonstrates fluid around the biceps tendon there is no evidence of subluxation with external and internal rotation. There appears to be some tethering of the tendon medially around the medial aspect of the groove  Long axis view bicipital groove demonstrating fluid inferior to the biceps tendon. There is also decreased tendon cross section diameter  Long axis view right subscapularis no evidence of tendon tear there is evidence of cortical irregularity along the lesser tuberosity  Short axis view right subscapularis normal tendon with no evidence of tear  Long axis view right supraspinatus, there is cortical irregularity, fluid in the subacromial space and possible tear, partial at the articular Surface of the supraspinatus  Short axis view right supraspinatus shows no clearcut evidence of tear, tendon width is increased 0.63 cm  Long axis view right infraspinatus shows no evidence of tendon tear, tendon cross sectional diameter is reduced 0.21 cm, articular surface looks smooth Short axis view right infraspinatus, no evidence of tear tendon cross sectional diameter 0.2 to centimeters, articular surface looks smooth  Right acromioclavicular joint long axis view there is acromial aspect evidence of osteoarthritis. There is no evidence of capsular disruption, there is tenderness with sono palpation  Impression :1. Abnormal study 2.   Evidence of tenosynovitis right biceps tendon with possible partial tear 3. Rotator cuff arthropathy right supraspinatus with possible partial tear 4. Right acromioclavicular joint osteoarthritis with tenderness

## 2015-07-25 ENCOUNTER — Telehealth: Payer: Self-pay | Admitting: Pulmonary Disease

## 2015-07-25 DIAGNOSIS — B9620 Unspecified Escherichia coli [E. coli] as the cause of diseases classified elsewhere: Secondary | ICD-10-CM | POA: Diagnosis not present

## 2015-07-25 DIAGNOSIS — I69351 Hemiplegia and hemiparesis following cerebral infarction affecting right dominant side: Secondary | ICD-10-CM | POA: Diagnosis not present

## 2015-07-25 DIAGNOSIS — E785 Hyperlipidemia, unspecified: Secondary | ICD-10-CM | POA: Diagnosis not present

## 2015-07-25 DIAGNOSIS — N390 Urinary tract infection, site not specified: Secondary | ICD-10-CM | POA: Diagnosis not present

## 2015-07-25 DIAGNOSIS — J45909 Unspecified asthma, uncomplicated: Secondary | ICD-10-CM | POA: Diagnosis not present

## 2015-07-25 NOTE — Telephone Encounter (Signed)
Called spoke with spouse. He wanted to know if pt uses the rescue inhaler, does she still use her dulera daily. I advised yes. The dulera is considered maintenance inhaler. He verbalized understanding and needed nothing further.

## 2015-07-26 DIAGNOSIS — I69351 Hemiplegia and hemiparesis following cerebral infarction affecting right dominant side: Secondary | ICD-10-CM | POA: Diagnosis not present

## 2015-07-26 DIAGNOSIS — E785 Hyperlipidemia, unspecified: Secondary | ICD-10-CM | POA: Diagnosis not present

## 2015-07-26 DIAGNOSIS — N390 Urinary tract infection, site not specified: Secondary | ICD-10-CM | POA: Diagnosis not present

## 2015-07-26 DIAGNOSIS — J45909 Unspecified asthma, uncomplicated: Secondary | ICD-10-CM | POA: Diagnosis not present

## 2015-07-26 DIAGNOSIS — B9620 Unspecified Escherichia coli [E. coli] as the cause of diseases classified elsewhere: Secondary | ICD-10-CM | POA: Diagnosis not present

## 2015-07-27 DIAGNOSIS — N390 Urinary tract infection, site not specified: Secondary | ICD-10-CM | POA: Diagnosis not present

## 2015-07-27 DIAGNOSIS — B9620 Unspecified Escherichia coli [E. coli] as the cause of diseases classified elsewhere: Secondary | ICD-10-CM | POA: Diagnosis not present

## 2015-07-27 DIAGNOSIS — J45909 Unspecified asthma, uncomplicated: Secondary | ICD-10-CM | POA: Diagnosis not present

## 2015-07-27 DIAGNOSIS — I69351 Hemiplegia and hemiparesis following cerebral infarction affecting right dominant side: Secondary | ICD-10-CM | POA: Diagnosis not present

## 2015-07-27 DIAGNOSIS — E785 Hyperlipidemia, unspecified: Secondary | ICD-10-CM | POA: Diagnosis not present

## 2015-07-30 ENCOUNTER — Telehealth: Payer: Self-pay | Admitting: Pulmonary Disease

## 2015-07-30 DIAGNOSIS — I69351 Hemiplegia and hemiparesis following cerebral infarction affecting right dominant side: Secondary | ICD-10-CM | POA: Diagnosis not present

## 2015-07-30 DIAGNOSIS — B9620 Unspecified Escherichia coli [E. coli] as the cause of diseases classified elsewhere: Secondary | ICD-10-CM | POA: Diagnosis not present

## 2015-07-30 DIAGNOSIS — E785 Hyperlipidemia, unspecified: Secondary | ICD-10-CM | POA: Diagnosis not present

## 2015-07-30 DIAGNOSIS — J45909 Unspecified asthma, uncomplicated: Secondary | ICD-10-CM | POA: Diagnosis not present

## 2015-07-30 DIAGNOSIS — N390 Urinary tract infection, site not specified: Secondary | ICD-10-CM | POA: Diagnosis not present

## 2015-07-30 MED ORDER — MOMETASONE FURO-FORMOTEROL FUM 100-5 MCG/ACT IN AERO: 2.0000 | INHALATION_SPRAY | Freq: Two times a day (BID) | RESPIRATORY_TRACT | Status: DC

## 2015-07-30 MED ORDER — ALBUTEROL SULFATE HFA 108 (90 BASE) MCG/ACT IN AERS: 2.0000 | INHALATION_SPRAY | Freq: Four times a day (QID) | RESPIRATORY_TRACT | Status: DC | PRN

## 2015-07-30 NOTE — Telephone Encounter (Signed)
Spoke with pt's husband. Pt needs refills on Albuterol HFA and Dulera 100. Has upcoming appointment with RA in [place] on 09/13/15. Rxs have been sent in. Nothing further was needed.

## 2015-08-01 DIAGNOSIS — I69351 Hemiplegia and hemiparesis following cerebral infarction affecting right dominant side: Secondary | ICD-10-CM | POA: Diagnosis not present

## 2015-08-01 DIAGNOSIS — B9620 Unspecified Escherichia coli [E. coli] as the cause of diseases classified elsewhere: Secondary | ICD-10-CM | POA: Diagnosis not present

## 2015-08-01 DIAGNOSIS — N390 Urinary tract infection, site not specified: Secondary | ICD-10-CM | POA: Diagnosis not present

## 2015-08-01 DIAGNOSIS — J45909 Unspecified asthma, uncomplicated: Secondary | ICD-10-CM | POA: Diagnosis not present

## 2015-08-01 DIAGNOSIS — E785 Hyperlipidemia, unspecified: Secondary | ICD-10-CM | POA: Diagnosis not present

## 2015-08-02 ENCOUNTER — Observation Stay (HOSPITAL_COMMUNITY): Payer: Medicare Other

## 2015-08-02 ENCOUNTER — Encounter (HOSPITAL_COMMUNITY): Payer: Medicare Other

## 2015-08-02 ENCOUNTER — Inpatient Hospital Stay (HOSPITAL_COMMUNITY)
Admission: EM | Admit: 2015-08-02 | Discharge: 2015-08-03 | DRG: 065 | Disposition: A | Discharge status: Home Health | Payer: Medicare Other | Attending: Internal Medicine | Admitting: Internal Medicine

## 2015-08-02 ENCOUNTER — Emergency Department (HOSPITAL_COMMUNITY): Payer: Medicare Other

## 2015-08-02 ENCOUNTER — Encounter (HOSPITAL_COMMUNITY): Payer: Self-pay | Admitting: Emergency Medicine

## 2015-08-02 DIAGNOSIS — R402362 Coma scale, best motor response, obeys commands, at arrival to emergency department: Secondary | ICD-10-CM | POA: Diagnosis not present

## 2015-08-02 DIAGNOSIS — J4550 Severe persistent asthma, uncomplicated: Secondary | ICD-10-CM | POA: Diagnosis not present

## 2015-08-02 DIAGNOSIS — R402142 Coma scale, eyes open, spontaneous, at arrival to emergency department: Secondary | ICD-10-CM | POA: Diagnosis not present

## 2015-08-02 DIAGNOSIS — G8194 Hemiplegia, unspecified affecting left nondominant side: Secondary | ICD-10-CM | POA: Diagnosis not present

## 2015-08-02 DIAGNOSIS — R413 Other amnesia: Secondary | ICD-10-CM | POA: Diagnosis present

## 2015-08-02 DIAGNOSIS — R29810 Facial weakness: Secondary | ICD-10-CM | POA: Diagnosis present

## 2015-08-02 DIAGNOSIS — Z7982 Long term (current) use of aspirin: Secondary | ICD-10-CM

## 2015-08-02 DIAGNOSIS — Z791 Long term (current) use of non-steroidal anti-inflammatories (NSAID): Secondary | ICD-10-CM

## 2015-08-02 DIAGNOSIS — R531 Weakness: Secondary | ICD-10-CM | POA: Diagnosis present

## 2015-08-02 DIAGNOSIS — I519 Heart disease, unspecified: Secondary | ICD-10-CM | POA: Diagnosis present

## 2015-08-02 DIAGNOSIS — H5441 Blindness, right eye, normal vision left eye: Secondary | ICD-10-CM | POA: Diagnosis not present

## 2015-08-02 DIAGNOSIS — R29720 NIHSS score 20: Secondary | ICD-10-CM | POA: Diagnosis not present

## 2015-08-02 DIAGNOSIS — I639 Cerebral infarction, unspecified: Secondary | ICD-10-CM | POA: Diagnosis not present

## 2015-08-02 DIAGNOSIS — I6789 Other cerebrovascular disease: Secondary | ICD-10-CM | POA: Diagnosis not present

## 2015-08-02 DIAGNOSIS — M6289 Other specified disorders of muscle: Secondary | ICD-10-CM | POA: Diagnosis not present

## 2015-08-02 DIAGNOSIS — N179 Acute kidney failure, unspecified: Secondary | ICD-10-CM | POA: Diagnosis present

## 2015-08-02 DIAGNOSIS — Z7722 Contact with and (suspected) exposure to environmental tobacco smoke (acute) (chronic): Secondary | ICD-10-CM | POA: Diagnosis present

## 2015-08-02 DIAGNOSIS — Z8673 Personal history of transient ischemic attack (TIA), and cerebral infarction without residual deficits: Secondary | ICD-10-CM

## 2015-08-02 DIAGNOSIS — R471 Dysarthria and anarthria: Secondary | ICD-10-CM | POA: Diagnosis present

## 2015-08-02 DIAGNOSIS — Z9181 History of falling: Secondary | ICD-10-CM

## 2015-08-02 DIAGNOSIS — R4781 Slurred speech: Secondary | ICD-10-CM | POA: Diagnosis present

## 2015-08-02 DIAGNOSIS — Z820 Family history of epilepsy and other diseases of the nervous system: Secondary | ICD-10-CM

## 2015-08-02 DIAGNOSIS — I638 Other cerebral infarction: Secondary | ICD-10-CM

## 2015-08-02 DIAGNOSIS — R402252 Coma scale, best verbal response, oriented, at arrival to emergency department: Secondary | ICD-10-CM | POA: Diagnosis present

## 2015-08-02 DIAGNOSIS — M4712 Other spondylosis with myelopathy, cervical region: Secondary | ICD-10-CM | POA: Diagnosis present

## 2015-08-02 DIAGNOSIS — G3184 Mild cognitive impairment, so stated: Secondary | ICD-10-CM | POA: Diagnosis present

## 2015-08-02 DIAGNOSIS — N19 Unspecified kidney failure: Secondary | ICD-10-CM | POA: Diagnosis present

## 2015-08-02 DIAGNOSIS — I6782 Cerebral ischemia: Secondary | ICD-10-CM | POA: Diagnosis not present

## 2015-08-02 DIAGNOSIS — N183 Chronic kidney disease, stage 3 unspecified: Secondary | ICD-10-CM | POA: Diagnosis present

## 2015-08-02 DIAGNOSIS — R7989 Other specified abnormal findings of blood chemistry: Secondary | ICD-10-CM | POA: Diagnosis present

## 2015-08-02 DIAGNOSIS — E785 Hyperlipidemia, unspecified: Secondary | ICD-10-CM | POA: Diagnosis present

## 2015-08-02 DIAGNOSIS — Z7902 Long term (current) use of antithrombotics/antiplatelets: Secondary | ICD-10-CM

## 2015-08-02 DIAGNOSIS — Z7952 Long term (current) use of systemic steroids: Secondary | ICD-10-CM

## 2015-08-02 DIAGNOSIS — I10 Essential (primary) hypertension: Secondary | ICD-10-CM | POA: Diagnosis present

## 2015-08-02 DIAGNOSIS — Z7951 Long term (current) use of inhaled steroids: Secondary | ICD-10-CM

## 2015-08-02 DIAGNOSIS — I6389 Other cerebral infarction: Secondary | ICD-10-CM

## 2015-08-02 DIAGNOSIS — I5189 Other ill-defined heart diseases: Secondary | ICD-10-CM | POA: Diagnosis present

## 2015-08-02 LAB — COMPREHENSIVE METABOLIC PANEL
ALK PHOS: 42 U/L (ref 38–126)
ALT: 18 U/L (ref 14–54)
ANION GAP: 8 (ref 5–15)
AST: 21 U/L (ref 15–41)
Albumin: 3.1 g/dL — ABNORMAL LOW (ref 3.5–5.0)
BILIRUBIN TOTAL: 0.6 mg/dL (ref 0.3–1.2)
BUN: 19 mg/dL (ref 6–20)
CALCIUM: 8.8 mg/dL — AB (ref 8.9–10.3)
CO2: 26 mmol/L (ref 22–32)
Chloride: 110 mmol/L (ref 101–111)
Creatinine, Ser: 1.31 mg/dL — ABNORMAL HIGH (ref 0.44–1.00)
GFR calc Af Amer: 40 mL/min — ABNORMAL LOW (ref >60)
GFR, EST NON AFRICAN AMERICAN: 34 mL/min — AB (ref >60)
Glucose, Bld: 88 mg/dL (ref 65–99)
POTASSIUM: 4.1 mmol/L (ref 3.5–5.1)
Sodium: 144 mmol/L (ref 135–145)
TOTAL PROTEIN: 5.9 g/dL — AB (ref 6.5–8.1)

## 2015-08-02 LAB — DIFFERENTIAL
Basophils Absolute: 0 10*3/uL (ref 0.0–0.1)
Basophils Relative: 0 %
EOS ABS: 0.7 10*3/uL (ref 0.0–0.7)
EOS PCT: 8 %
LYMPHS ABS: 3.9 10*3/uL (ref 0.7–4.0)
LYMPHS PCT: 43 %
MONOS PCT: 9 %
Monocytes Absolute: 0.8 10*3/uL (ref 0.1–1.0)
Neutro Abs: 3.7 10*3/uL (ref 1.7–7.7)
Neutrophils Relative %: 40 %

## 2015-08-02 LAB — CBC
HEMATOCRIT: 42.3 % (ref 36.0–46.0)
HEMOGLOBIN: 13.7 g/dL (ref 12.0–15.0)
MCH: 28.7 pg (ref 26.0–34.0)
MCHC: 32.4 g/dL (ref 30.0–36.0)
MCV: 88.5 fL (ref 78.0–100.0)
Platelets: 230 10*3/uL (ref 150–400)
RBC: 4.78 MIL/uL (ref 3.87–5.11)
RDW: 16.5 % — ABNORMAL HIGH (ref 11.5–15.5)
WBC: 9.1 10*3/uL (ref 4.0–10.5)

## 2015-08-02 LAB — URINALYSIS, ROUTINE W REFLEX MICROSCOPIC
Bilirubin Urine: NEGATIVE
GLUCOSE, UA: NEGATIVE mg/dL
Hgb urine dipstick: NEGATIVE
KETONES UR: NEGATIVE mg/dL
LEUKOCYTES UA: NEGATIVE
NITRITE: NEGATIVE
PROTEIN: NEGATIVE mg/dL
Specific Gravity, Urine: 1.026 (ref 1.005–1.030)
pH: 7.5 (ref 5.0–8.0)

## 2015-08-02 LAB — I-STAT CHEM 8, ED
BUN: 25 mg/dL — ABNORMAL HIGH (ref 6–20)
CALCIUM ION: 1.1 mmol/L — AB (ref 1.13–1.30)
CREATININE: 1.3 mg/dL — AB (ref 0.44–1.00)
Chloride: 107 mmol/L (ref 101–111)
GLUCOSE: 82 mg/dL (ref 65–99)
HEMATOCRIT: 45 % (ref 36.0–46.0)
HEMOGLOBIN: 15.3 g/dL — AB (ref 12.0–15.0)
Potassium: 4.1 mmol/L (ref 3.5–5.1)
Sodium: 143 mmol/L (ref 135–145)
TCO2: 26 mmol/L (ref 0–100)

## 2015-08-02 LAB — RAPID URINE DRUG SCREEN, HOSP PERFORMED
Amphetamines: NOT DETECTED
BARBITURATES: NOT DETECTED
Benzodiazepines: NOT DETECTED
COCAINE: NOT DETECTED
OPIATES: NOT DETECTED
TETRAHYDROCANNABINOL: NOT DETECTED

## 2015-08-02 LAB — CBG MONITORING, ED: GLUCOSE-CAPILLARY: 80 mg/dL (ref 65–99)

## 2015-08-02 LAB — ETHANOL: Alcohol, Ethyl (B): <5 mg/dL (ref <5)

## 2015-08-02 LAB — PROTIME-INR
INR: 1.05 (ref 0.00–1.49)
Prothrombin Time: 13.9 seconds (ref 11.6–15.2)

## 2015-08-02 LAB — APTT: aPTT: 30 seconds (ref 24–37)

## 2015-08-02 LAB — I-STAT TROPONIN, ED: TROPONIN I, POC: 0 ng/mL (ref 0.00–0.08)

## 2015-08-02 MED ORDER — DICLOFENAC SODIUM 1 % TD GEL
2.0000 g | Freq: Four times a day (QID) | TRANSDERMAL | Status: DC
  Administered 2015-08-02 – 2015-08-03 (×4): 2 g via TOPICAL
  Filled 2015-08-02: qty 100

## 2015-08-02 MED ORDER — CALCIUM CARBONATE 1250 (500 CA) MG PO TABS
600.0000 mg | ORAL_TABLET | Freq: Every day | ORAL | Status: DC
  Administered 2015-08-02 – 2015-08-03 (×2): 625 mg via ORAL
  Filled 2015-08-02 (×2): qty 1

## 2015-08-02 MED ORDER — PREDNISONE 5 MG PO TABS
5.0000 mg | ORAL_TABLET | Freq: Every day | ORAL | Status: DC
Start: 2015-08-02 — End: 2015-08-03
  Administered 2015-08-02 – 2015-08-03 (×2): 5 mg via ORAL
  Filled 2015-08-02 (×2): qty 1

## 2015-08-02 MED ORDER — IPRATROPIUM-ALBUTEROL 0.5-2.5 (3) MG/3ML IN SOLN: 3.0000 mL | Freq: Four times a day (QID) | RESPIRATORY_TRACT | Status: DC | PRN

## 2015-08-02 MED ORDER — ENSURE ENLIVE PO LIQD
237.0000 mL | ORAL | Status: DC
  Administered 2015-08-02: 237 mL via ORAL
  Filled 2015-08-02 (×2): qty 237

## 2015-08-02 MED ORDER — ADULT MULTIVITAMIN W/MINERALS CH
1.0000 | ORAL_TABLET | Freq: Every day | ORAL | Status: DC
  Administered 2015-08-02 – 2015-08-03 (×2): 1 via ORAL
  Filled 2015-08-02 (×3): qty 1

## 2015-08-02 MED ORDER — VITAMIN D 1000 UNITS PO TABS
1000.0000 [IU] | ORAL_TABLET | Freq: Every day | ORAL | Status: DC
  Administered 2015-08-02 – 2015-08-03 (×2): 1000 [IU] via ORAL
  Filled 2015-08-02 (×2): qty 1

## 2015-08-02 MED ORDER — SODIUM CHLORIDE 0.9 % IV SOLN
INTRAVENOUS | Status: AC
  Administered 2015-08-02: 09:00:00 via INTRAVENOUS

## 2015-08-02 MED ORDER — ASPIRIN EC 81 MG PO TBEC
81.0000 mg | DELAYED_RELEASE_TABLET | Freq: Every day | ORAL | Status: DC
  Administered 2015-08-02 – 2015-08-03 (×2): 81 mg via ORAL
  Filled 2015-08-02 (×2): qty 1

## 2015-08-02 MED ORDER — IPRATROPIUM-ALBUTEROL 0.5-2.5 (3) MG/3ML IN SOLN: 3.0000 mL | Freq: Three times a day (TID) | RESPIRATORY_TRACT | Status: DC

## 2015-08-02 MED ORDER — CLOPIDOGREL BISULFATE 75 MG PO TABS
75.0000 mg | ORAL_TABLET | Freq: Every day | ORAL | Status: DC
  Administered 2015-08-02 – 2015-08-03 (×2): 75 mg via ORAL
  Filled 2015-08-02 (×2): qty 1

## 2015-08-02 MED ORDER — ALBUTEROL SULFATE (2.5 MG/3ML) 0.083% IN NEBU: 2.5000 mg | INHALATION_SOLUTION | Freq: Four times a day (QID) | RESPIRATORY_TRACT | Status: DC | PRN

## 2015-08-02 MED ORDER — ENOXAPARIN SODIUM 40 MG/0.4ML ~~LOC~~ SOLN
40.0000 mg | SUBCUTANEOUS | Status: DC
  Administered 2015-08-02 – 2015-08-03 (×2): 40 mg via SUBCUTANEOUS
  Filled 2015-08-02 (×2): qty 0.4

## 2015-08-02 MED ORDER — MOMETASONE FURO-FORMOTEROL FUM 100-5 MCG/ACT IN AERO
2.0000 | INHALATION_SPRAY | Freq: Two times a day (BID) | RESPIRATORY_TRACT | Status: DC
  Administered 2015-08-03: 2 via RESPIRATORY_TRACT
  Filled 2015-08-02 (×2): qty 8.8

## 2015-08-02 MED ORDER — PRAVASTATIN SODIUM 20 MG PO TABS
20.0000 mg | ORAL_TABLET | Freq: Every day | ORAL | Status: DC
  Administered 2015-08-02: 20 mg via ORAL
  Filled 2015-08-02: qty 1

## 2015-08-02 MED ORDER — ALBUTEROL SULFATE HFA 108 (90 BASE) MCG/ACT IN AERS: 2.0000 | INHALATION_SPRAY | Freq: Four times a day (QID) | RESPIRATORY_TRACT | Status: DC | PRN

## 2015-08-02 MED ORDER — SODIUM CHLORIDE 0.9 % IV BOLUS (SEPSIS)
500.0000 mL | Freq: Once | INTRAVENOUS | Status: AC
  Administered 2015-08-02: 500 mL via INTRAVENOUS

## 2015-08-02 MED ORDER — STROKE: EARLY STAGES OF RECOVERY BOOK
Freq: Once | Status: AC
  Administered 2015-08-02: 09:00:00
  Filled 2015-08-02: qty 1

## 2015-08-02 NOTE — ED Notes (Signed)
RR will enter NIHSS

## 2015-08-02 NOTE — H&P (Signed)
[facility] History and Physical                                                                                    Darlene Murray, is a 82 y.o. female  MRN: [id number]   DOB - April 08, 1922  Admit Date - 08/02/2015  Outpatient Primary MD for the patient is Gennette Pac, MD  Referring MD: Claudine Mouton / ER  PMH: Past Medical History  Diagnosis Date  . Blindness   . Asthmatic bronchitis   . Asthma   . Fibrocystic breast disease   . Allergic rhinitis   . Hyperlipidemia   . Venous insufficiency     lower extremities  . HNP (herniated nucleus pulposus), lumbar     L4-L5  . Left ventricular hypertrophy 2008    mild  . Antibiotic-associated diarrhea   . Stroke (cerebrum) (HCC)       PSH: Past Surgical History  Procedure Laterality Date  . Cataract extraction    . Colonoscopy       CC:  Chief Complaint  Patient presents with  . 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 She presented to the ER today after awakening at 4 AM with acute right-sided weakness, this was associated with patient falling and hitting her head against the window without sustaining injury. Sons noticed patient also had right facial drooping and dysarthria. Symptoms lasted about 1-2 hours and had resolved by the time the patient arrived to the ER. Patient has been having intermittent dizziness with activity but was unable to quantify duration or frequency of the symptoms. She did notice she was dizzy last night as well. She has not had any fevers or chills although family reports that she recently had an asthma flare requiring frequent use of prescribed MDI inhalers. She's not had any nausea, vomiting or diarrhea. At baseline she has poor intake and the  family has to encourage her to drink boost milk and water and other liquids. She does not have any issues with dysphagia. The patient has been evaluated by neurology in the ER and recommendations have been made.  In review of outpatient cardiology documentation, Dr. Lovena Le had a long discussion with the patient and her family. Given her history of mixed hemorrhagic and nonhemorrhagic stroke beginning in September 2016 with a recurrent embolic stroke documented in December 2016 (after treatment for urosepsis in October 2016) under normal circumstances a TEE (+/- loop recorder) would be the standard of care noting an event monitor would not be beneficial. Unfortunately in her case given her advanced age, risk for falls and history of hemorrhagic stroke the option of systemic anticoagulation is less appealing. She remains stable without bleeding on aspirin and Plavix. It was documented during that visit in December that patient did have 2 falls on a single day 2 days prior to that evaluation with etiology unclear. The options of TEE and loop recorder were discussed with the patient  and she was not interested in either.  ER Evaluation and treatment: Afebrile vital signs stable, heart rate borderline bradycardia, room air saturations 98%. CT of the head without contrast: No acute intracranial pathology seen. Cerebellar atrophy with chronic infarcts bilaterally cerebellar hemispheres, chronic lacunar infarcts at the thalami bilaterally. EKG: Sinus rhythm with ventricular rate 64 bpm, QTC 462 ms, right bundle branch block left anterior fascicular block unchanged from previous EKGs including no obvious evidence of ischemia Abnormal labs: BUN 25 and creatinine 1.3 with baseline BUN 19 and creatinine 1.31, urinalysis cloudy in appearance with borderline elevated specific gravity of 1.026  Review of Systems   In addition to the HPI above,  No Fever-chills, myalgias or other constitutional symptoms No Headache,  changes with Vision or hearing No problems swallowing food or Liquids, indigestion/reflux No Chest pain, palpitations, orthopnea or DOE No Abdominal pain, N/V; no melena or hematochezia, no dark tarry stools No dysuria, hematuria or flank pain No new skin rashes, lesions, masses or bruises, No new joints pains-aches No recent weight gain or loss No polyuria, polydypsia or polyphagia,  *A full 10 point Review of Systems was done, except as stated above, all other Review of Systems were negative.  Social History Social History  Substance Use Topics  . Smoking status: Passive Smoke Exposure - Never Smoker  . Smokeless tobacco: Never Used     Comment: Prior exposure through husband.   . Alcohol Use: No    Resides at: Private residence  Lives with: Sons  Ambulatory status: Primarily with a wheelchair but is improving and utilizing a rolling walker and attempting to begin mobilizing with a cane in the past 2 weeks with physical therapy assistance   Family History Family History  Problem Relation Age of Onset  . Heart attack Sister   . Alzheimer's disease Sister   . Asthma Son      Prior to Admission medications   Medication Sig Start Date End Date Taking? Authorizing Provider  albuterol (PROVENTIL HFA;VENTOLIN HFA) 108 (90 Base) MCG/ACT inhaler Inhale 2 puffs into the lungs every 6 (six) hours as needed for wheezing or shortness of breath. 07/30/15   Rigoberto Noel, MD  aspirin 81 MG tablet Take 81 mg by mouth daily.    Historical Provider, MD  calcium carbonate (OS-CAL) 600 MG TABS Take 600 mg by mouth daily.      Historical Provider, MD  cholecalciferol (VITAMIN D) 1000 UNITS tablet Take 1,000 Units by mouth daily.    Historical Provider, MD  clopidogrel (PLAVIX) 75 MG tablet Take 1 tablet (75 mg total) by mouth daily. 06/13/15   Marcial Pacas, MD  diclofenac sodium (VOLTAREN) 1 % GEL Apply 2 g topically 4 (four) times daily. 07/23/15   Charlett Blake, MD  mometasone-formoterol  (DULERA) 100-5 MCG/ACT AERO Inhale 2 puffs into the lungs 2 (two) times daily. 07/30/15   Rigoberto Noel, MD  Multiple Vitamin (MULTIVITAMIN) tablet Take 1 tablet by mouth daily.    Historical Provider, MD  pravastatin (PRAVACHOL) 20 MG tablet Take 1 tablet (20 mg total) by mouth daily at 6 PM. 05/23/15   Marcial Pacas, MD  predniSONE (DELTASONE) 5 MG tablet Take 1 tablet (5 mg total) by mouth daily. 04/20/15   Lavon Paganini Angiulli, PA-C    No Known Allergies  Physical Exam  Vitals  Blood pressure 132/66, pulse 58, temperature 98.1 F (36.7 C), temperature source Oral, resp. rate 15, height 5\' 5"  (1.651 m), weight 160 lb (72.576 kg), SpO2  97 %.   General:  In no acute distress, appears younger than stated age  Psych:  Normal affect, Denies Suicidal or Homicidal ideations, Awake Alert, Oriented X 3. Speech and thought patterns are clear and appropriate  Neuro:   No focal neurological deficits, CN II through XII intact that for previously documented congenital blindness right eye/associated ptosis vs purposeful lid closure, Strength 5/5 left side, right side with subtle weaknesses 4+/5-history of ataxia and gait disturbance since previous stroke and these were not tested in the ER, Sensation intact all 4 extremities. No dysmetria-adequate heel shin test bilaterally  ENT:  Ears and Eyes appear Normal, Conjunctivae clear, PER. Dry oral mucosa without erythema or exudates.  Neck:  Supple, No lymphadenopathy appreciated  Respiratory:  Symmetrical chest wall movement, Good air movement bilaterally, CTAB. Room Air  Cardiac:  RRR, No Murmurs, no LE edema noted, no JVD, No carotid bruits, peripheral pulses palpable at 2+  Abdomen:  Positive bowel sounds, Soft, Non tender, Non distended,  No masses appreciated, no obvious hepatosplenomegaly  Skin:  No Cyanosis, Normal Skin Turgor, No Skin Rash or Bruise.  Extremities: Symmetrical without obvious trauma or injury,  no effusions.  Data  Review  CBC  Recent Labs Lab 08/02/15 0513 08/02/15 0518  WBC 9.1  --   HGB 13.7 15.3*  HCT 42.3 45.0  PLT 230  --   MCV 88.5  --   MCH 28.7  --   MCHC 32.4  --   RDW 16.5*  --   LYMPHSABS 3.9  --   MONOABS 0.8  --   EOSABS 0.7  --   BASOSABS 0.0  --     Chemistries   Recent Labs Lab 08/02/15 0513 08/02/15 0518  NA 144 143  K 4.1 4.1  CL 110 107  CO2 26  --   GLUCOSE 88 82  BUN 19 25*  CREATININE 1.31* 1.30*  CALCIUM 8.8*  --   AST 21  --   ALT 18  --   ALKPHOS 42  --   BILITOT 0.6  --     estimated creatinine clearance is 28.1 mL/min (by C-G formula based on Cr of 1.3).  No results for input(s): TSH, T4TOTAL, T3FREE, THYROIDAB in the last 72 hours.  Invalid input(s): FREET3  Coagulation profile  Recent Labs Lab 08/02/15 0513  INR 1.05    No results for input(s): DDIMER in the last 72 hours.  Cardiac Enzymes No results for input(s): CKMB, TROPONINI, MYOGLOBIN in the last 168 hours.  Invalid input(s): CK  Invalid input(s): POCBNP  Urinalysis    Component Value Date/Time   COLORURINE YELLOW 08/02/2015 0553   APPEARANCEUR CLOUDY* 08/02/2015 0553   LABSPEC 1.026 08/02/2015 0553   PHURINE 7.5 08/02/2015 0553   GLUCOSEU NEGATIVE 08/02/2015 0553   HGBUR NEGATIVE 08/02/2015 0553   BILIRUBINUR NEGATIVE 08/02/2015 0553   KETONESUR NEGATIVE 08/02/2015 0553   PROTEINUR NEGATIVE 08/02/2015 0553   UROBILINOGEN 1.0 04/23/2015 1106   NITRITE NEGATIVE 08/02/2015 0553   LEUKOCYTESUR NEGATIVE 08/02/2015 0553    Imaging results:   Ct Head Wo Contrast  08/02/2015  CLINICAL DATA:  Code stroke. Acute onset of left-sided facial droop and left-sided weakness. Initial encounter. EXAM: CT HEAD WITHOUT CONTRAST TECHNIQUE: Contiguous axial images were obtained from the base of the skull through the vertex without intravenous contrast. COMPARISON:  CT of the head performed 04/23/2015, and MRI of the brain performed 06/12/2015 FINDINGS: There is no evidence of  acute infarction, mass lesion, or  intra- or extra-axial hemorrhage on CT. Prominence of the ventricles and sulci reflects mild to moderate cortical volume loss. Cerebellar atrophy is noted, with chronic infarcts noted bilaterally at the cerebellar hemispheres. Mild periventricular white matter change likely reflects small vessel ischemic microangiopathy. Chronic lacunar infarcts are seen at the thalami bilaterally. The brainstem and fourth ventricle are within normal limits. The cerebral hemispheres demonstrate grossly normal gray-white differentiation. No mass effect or midline shift is seen. There is no evidence of fracture; visualized osseous structures are unremarkable in appearance. Postoperative change is suggested at both orbits. The paranasal sinuses and mastoid air cells are well-aerated. No significant soft tissue abnormalities are seen. IMPRESSION: 1. No acute intracranial pathology seen on CT. 2. Mild to moderate cortical volume loss and scattered small vessel ischemic microangiopathy. 3. Cerebellar atrophy, with chronic infarcts bilaterally at the cerebellar hemispheres. 4. Chronic lacunar infarcts at the thalami bilaterally. These results were called by telephone at the time of interpretation on 08/02/2015 at 5:23 am to Dr. Everlene Balls, who verbally acknowledged these results. These results were called by telephone at the time of interpretation on 08/02/2015 at 5:23 am to Dr. Everlene Balls, who verbally acknowledged these results. Electronically Signed   By: Garald Balding M.D.   On: 08/02/2015 05:27     EKG: (Independently reviewed)  Sinus rhythm with ventricular rate 64 bpm, QTC 462 ms, right bundle branch block left anterior fascicular block unchanged from previous EKGs including no obvious evidence of ischemia   Assessment & Plan  Principal Problem:   Acute right-sided weakness/History of mixed hemorraghic and embolic stroke -Prior to admission with right side weakness worsened baseline  associated with dysarthria and right facial drooping now resolved-suspect recurrent embolic CVA -Tele / Obs -Neuro following -MRI/MRA brain pending -Echocardiogram and carotid duplex done in October with recommendations from neurology to repeat this admission-have ordered tentatively pending discussion with neurology -PT/OT/SLP -Aspirin and Plavix -Per previous discussion by cardiologist patient at risk for full anticoagulation with Coumadin or NOAC and history of falls, advanced age, and prior hemorrhagic CVA-patient previously has refused TEE (see outpatient note) and per minute discussion with patient and family at admission continues to refuse this procedure  Active Problems:   Acute prerenal azotemia/CKD, stage III -Appears to be mildly volume depleted based on labs and history -Normal saline IV fluid for 12 hours -Repeat labs in a.m.    Severe persistent asthma in adult steroid dependent -Continue preadmission prednisone and inhalers -Some wheezing at time of admission so have added duo nebs scheduled 3 times a day    Left ventricular diastolic dysfunction with preserved systolic function  -No prior history of heart failure or significant valvular dysfunction (mild AR and trivial MR)    Hyperlipidemia -Continue preadmission Pravachol -Lipid panel    Memory loss -Family history of Alzheimer -Recurrent CVAs contributing     DVT Prophylaxis: Lovenox  Family Communication:   2 sons at bedside  Code Status:  Full code-confirmed by sons  Condition:  Stable  Discharge disposition:Suspect discharge back to preadmission environment/home setting pending PT and OT evaluation and complete resolution of presenting symptomatology  Time spent in minutes : 60      Nasha Diss L. ANP on 08/02/2015 at 8:09 AM  You may contact me by going to [email or web address] - password [email or web address]  I am available from 7a-7p but please confirm I am on the schedule by going to Amion as above.   After 7p  please contact night coverage person covering me after hours  [place]

## 2015-08-02 NOTE — ED Notes (Signed)
Attempted report X1

## 2015-08-02 NOTE — Progress Notes (Signed)
Initial Nutrition Assessment   INTERVENTION:  Provide Ensure Enlive po once daily, each supplement provides 350 kcal and 20 grams of protein   NUTRITION DIAGNOSIS:   Unintentional weight loss related to chronic illness as evidenced by percent weight loss, per patient/family report.   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 She presented to the ER after awakening at 4 AM with acute right-sided weakness.  Pt reports that her appetite has been good and she was eating well PTA. She reports that she used to weigh 176 lbs and has lost weight due to eating healthier. Per weight history, pt has lost 9% of her body weight in the past 5 months. Pt appears well-nourished per nutrition-focused physical exam. She reports eating 50% of her meals since admission; states this is her normal intake. She drinks Boost plus every other day at home.   Labs reviewed.   Diet Order:  Diet Heart Room service appropriate?: Yes; Fluid consistency:: Thin  Skin:  Reviewed, no issues  Last BM:  PTA  Height:   Ht Readings from Last 1 Encounters:  08/02/15 5\' 5"  (1.651 m)    Weight:   Wt Readings from Last 1 Encounters:  08/02/15 160 lb (72.576 kg)    Ideal Body Weight:  56.8 kg  BMI:  Body mass index is 26.63 kg/(m^2).  Estimated Nutritional Needs:   Kcal:  1600-1800  Protein:  80-90 grams  Fluid:  1.8 L/day  EDUCATION NEEDS:   No education needs identified at this time  [place], LDN Inpatient Clinical Dietitian Pager: [phone] After Hours Pager: [phone]

## 2015-08-02 NOTE — Progress Notes (Signed)
STROKE TEAM PROGRESS NOTE   HISTORY OF PRESENT ILLNESS Buffy Jarecki is an 82 y.o. female hx of prior CVA, HLD presenting with left-sided weakness. LKW was 2100 the evening before 08/01/2015. She woke this morning around 400, noted left-sided weakness so EMS was called. Upon arrival to ED noted to be lethargic, neglecting left side and not moving left side. Within 15 minutes became more alert and noted to have improved movement of left side. Family reports patient lives at home, at baseline typically walks with a walker or cane. CT head imaging reviewed, shows no acute process but signs of multiple chronic infarcts. Modified Rankin: Rankin Score=3. Patient was not administered TPA secondary to outside tPA window. She was admitted for further evaluation and treatment.   SUBJECTIVE (INTERVAL HISTORY) Her husband is at the bedside.  Overall she feels her condition is stable. She is pleasant. Wants to go home.   OBJECTIVE Temp:  [97.3 F (36.3 C)-98.1 F (36.7 C)] 97.3 F (36.3 C) (01/26 1200) Pulse Rate:  [57-71] 71 (01/26 1200) Cardiac Rhythm:  [-]  Resp:  [14-19] 14 (01/26 1200) BP: (123-169)/(53-82) 123/55 mmHg (01/26 1200) SpO2:  [91 %-100 %] 95 % (01/26 1200) Weight:  [72.576 kg (160 lb)] 72.576 kg (160 lb) (01/26 0531)  CBC:   Recent Labs Lab 08/02/15 0513 08/02/15 0518  WBC 9.1  --   NEUTROABS 3.7  --   HGB 13.7 15.3*  HCT 42.3 45.0  MCV 88.5  --   PLT 230  --     Basic Metabolic Panel:   Recent Labs Lab 08/02/15 0513 08/02/15 0518  NA 144 143  K 4.1 4.1  CL 110 107  CO2 26  --   GLUCOSE 88 82  BUN 19 25*  CREATININE 1.31* 1.30*  CALCIUM 8.8*  --     Lipid Panel:     Component Value Date/Time   CHOL 161 04/07/2015 0622   TRIG 47 04/07/2015 0622   HDL 48 04/07/2015 0622   CHOLHDL 3.4 04/07/2015 0622   VLDL 9 04/07/2015 0622   LDLCALC 104* 04/07/2015 0622   HgbA1c:  Lab Results  Component Value Date   HGBA1C 6.0* 04/07/2015   Urine Drug Screen:      Component Value Date/Time   LABOPIA NONE DETECTED 08/02/2015 0553   COCAINSCRNUR NONE DETECTED 08/02/2015 0553   LABBENZ NONE DETECTED 08/02/2015 0553   AMPHETMU NONE DETECTED 08/02/2015 0553   THCU NONE DETECTED 08/02/2015 0553   LABBARB NONE DETECTED 08/02/2015 0553      IMAGING  Ct Head Wo Contrast  08/02/2015  CLINICAL DATA:  Code stroke. Acute onset of left-sided facial droop and left-sided weakness. Initial encounter. EXAM: CT HEAD WITHOUT CONTRAST TECHNIQUE: Contiguous axial images were obtained from the base of the skull through the vertex without intravenous contrast. COMPARISON:  CT of the head performed 04/23/2015, and MRI of the brain performed 06/12/2015 FINDINGS: There is no evidence of acute infarction, mass lesion, or intra- or extra-axial hemorrhage on CT. Prominence of the ventricles and sulci reflects mild to moderate cortical volume loss. Cerebellar atrophy is noted, with chronic infarcts noted bilaterally at the cerebellar hemispheres. Mild periventricular white matter change likely reflects small vessel ischemic microangiopathy. Chronic lacunar infarcts are seen at the thalami bilaterally. The brainstem and fourth ventricle are within normal limits. The cerebral hemispheres demonstrate grossly normal gray-white differentiation. No mass effect or midline shift is seen. There is no evidence of fracture; visualized osseous structures are unremarkable in appearance. Postoperative change  is suggested at both orbits. The paranasal sinuses and mastoid air cells are well-aerated. No significant soft tissue abnormalities are seen. IMPRESSION: 1. No acute intracranial pathology seen on CT. 2. Mild to moderate cortical volume loss and scattered small vessel ischemic microangiopathy. 3. Cerebellar atrophy, with chronic infarcts bilaterally at the cerebellar hemispheres. 4. Chronic lacunar infarcts at the thalami bilaterally. These results were called by telephone at the time of  interpretation on 08/02/2015 at 5:23 am to Dr. Everlene Balls, who verbally acknowledged these results. These results were called by telephone at the time of interpretation on 08/02/2015 at 5:23 am to Dr. Everlene Balls, who verbally acknowledged these results. Electronically Signed   By: Garald Balding M.D.   On: 08/02/2015 05:27       PHYSICAL EXAM Pleasant elderly African-American lady currently not in distress. . Afebrile. Head is nontraumatic. Neck is supple without bruit.    Cardiac exam no murmur or gallop. Lungs are clear to auscultation. Distal pulses are well felt. Neurological Exam :  Awake alert oriented x 3 normal speech and language. Diminished attention, registration and recall. Follows only simple one-step and midline commands Mild left lower face asymmetry. Tongue midline. No drift. Mild diminished fine finger movements on left. Orbits right over left upper extremity. Mild left grip weak.. Increased tone on the left side. Normal sensation . Normal coordination. Gait not tested. ASSESSMENT/PLAN Ms. Saramarie Mcguffie is a 82 y.o. female with history of blindness, asthma, recurrent stroke and TIA, memory loss, LVH, HLD presenting with left sided weakness. She did not receive IV t-PA due to being outside the window.   Stroke vs TIA.   MRI  Pending. To confirm or refute stroke. Test results will likely not change treatment    No further stroke workup indicated as full workup done in the fall (see below)  LDL 104 in Oct  HgbA1c 6.0 in Oct  Lovenox 40 mg sq daily for VTE prophylaxis Diet Heart Room service appropriate?: Yes; Fluid consistency:: Thin  aspirin 81 mg daily and clopidogrel 75 mg daily prior to admission, now on aspirin 81 mg daily and clopidogrel 75 mg daily  Patient counseled to be compliant with her antithrombotic medications  Ongoing aggressive stroke risk factor management  Therapy recommendations:  pending   Disposition:  pending (lives w. Husband, uses w/c and walker  at home. Cannot be left alone)  Followed by Dr. Krista Blue as an OP   Hypertension  Stable  Hyperlipidemia  Home meds:  Pravachol 20, resumed in hospital  LDL 104 in Oct, goal < 70  Continue statin at discharge  Other Stroke Risk Factors  Advanced age  Cigarette smoker, advised to stop smoking  Hx stroke/TIA  06/2015  posterior circulation infarct, embolic. Placed on asa and plavix, 30 d OP monitor requested along with TEE, follow up by cardiology 06/27/15 - pt had 2 falls day prior and is not interested in TEE or Loop. Card did not feel monitor would be of benefit  03/2015 Bilateral hemorrhagic and nonhemorrhagic infarcts probably embolic from an unknown source. Suspect basilar clot due to atrial fibrillation, no TEE/loop as not a coumadin candidate,   Other Active Problems  Mild cognitive impairment, PTA, likely vascular component. Followed by Dr. Krista Blue  Gait difficulty PTA, multifactorial due to recurrent stroke and cervical spondylitic myelopathy. Followed by Dr. Jilda Roche day # 0  Radene Journey [facility] for Pager information 08/02/2015 1:51 PM  I have personally examined this patient,  reviewed notes, independently viewed imaging studies, participated in medical decision making and plan of care. I have made any additions or clarifications directly to the above note. Agree with note above.  She presented with left hemiplegia likely due to right hemispheric TIA versus small stroke. She remains at risk for neurological worsening, recurrent stroke, TIA needs ongoing stroke evaluation and aggressive risk factor modification. Long discussion with the patient and husband at the bedside and answered questions.  Antony Contras, MD Medical Director [facility] Pager: [phone] 08/02/2015 5:45 PM    To contact Stroke Continuity provider, please refer to [email or web address] After hours, contact General Neurology

## 2015-08-02 NOTE — ED Provider Notes (Signed)
CSN: [id number]     Arrival date & time 08/02/15  0510 History   First MD Initiated Contact with Patient 08/02/15 0515     Chief Complaint  Patient presents with  . Code Stroke    An emergency department physician performed an initial assessment on this suspected stroke patient at [phone]. (Consider location/radiation/quality/duration/timing/severity/associated sxs/prior Treatment) HPI   [name] is a 82yo female, PMH of CVA, presenting today with stroke symptoms.  History was obtained from EMS.  Patient went to bed at 9pm and states she was normal at that time.  She woke up at 4am with L sided weakness.  Her family called 61.  She currently can not give history due to slurred speech.     Past Medical History  Diagnosis Date  . Blindness   . Asthmatic bronchitis   . Asthma   . Fibrocystic breast disease   . Allergic rhinitis   . Hyperlipidemia   . Venous insufficiency     lower extremities  . HNP (herniated nucleus pulposus), lumbar     L4-L5  . Left ventricular hypertrophy 2008    mild  . Antibiotic-associated diarrhea   . Stroke (cerebrum) [facility])    Past Surgical History  Procedure Laterality Date  . Cataract extraction    . Colonoscopy     Family History  Problem Relation Age of Onset  . Heart attack Sister   . Alzheimer's disease Sister   . Asthma Son    Social History  Substance Use Topics  . Smoking status: Passive Smoke Exposure - Never Smoker  . Smokeless tobacco: Never Used     Comment: Prior exposure through husband.   . Alcohol Use: No   OB History    No data available     Review of Systems  Unable to perform ROS: Acuity of condition      Allergies  Review of patient's allergies indicates no known allergies.  Home Medications   Prior to Admission medications   Medication Sig Start Date End Date Taking? Authorizing Provider  albuterol (PROVENTIL HFA;VENTOLIN HFA) 108 (90 Base) MCG/ACT inhaler Inhale 2 puffs into the lungs every 6 (six)  hours as needed for wheezing or shortness of breath. 07/30/15   Rigoberto Noel, MD  aspirin 81 MG tablet Take 81 mg by mouth daily.    Historical Provider, MD  calcium carbonate (OS-CAL) 600 MG TABS Take 600 mg by mouth daily.      Historical Provider, MD  cholecalciferol (VITAMIN D) 1000 UNITS tablet Take 1,000 Units by mouth daily.    Historical Provider, MD  clopidogrel (PLAVIX) 75 MG tablet Take 1 tablet (75 mg total) by mouth daily. 06/13/15   Marcial Pacas, MD  diclofenac sodium (VOLTAREN) 1 % GEL Apply 2 g topically 4 (four) times daily. 07/23/15   Charlett Blake, MD  mometasone-formoterol (DULERA) 100-5 MCG/ACT AERO Inhale 2 puffs into the lungs 2 (two) times daily. 07/30/15   Rigoberto Noel, MD  Multiple Vitamin (MULTIVITAMIN) tablet Take 1 tablet by mouth daily.    Historical Provider, MD  pravastatin (PRAVACHOL) 20 MG tablet Take 1 tablet (20 mg total) by mouth daily at 6 PM. 05/23/15   Marcial Pacas, MD  predniSONE (DELTASONE) 5 MG tablet Take 1 tablet (5 mg total) by mouth daily. 04/20/15   Lavon Paganini Angiulli, PA-C   BP 133/75 mmHg  Pulse 68  Temp(Src) 97.8 F (36.6 C) (Oral)  Resp 19  Ht 5\' 5"  (1.651 m)  Wt 160  lb (72.576 kg)  BMI 26.63 kg/m2  SpO2 94% Physical Exam  Constitutional: She appears well-developed and well-nourished. She appears distressed.  HENT:  Head: Normocephalic and atraumatic.  Nose: Nose normal.  Mouth/Throat: Oropharynx is clear and moist. No oropharyngeal exudate.  Eyes: No scleral icterus.  Neck: Normal range of motion. Neck supple. No JVD present. No tracheal deviation present. No thyromegaly present.  Cardiovascular: Normal rate, regular rhythm and normal heart sounds.  Exam reveals no gallop and no friction rub.   No murmur heard. Pulmonary/Chest: Effort normal and breath sounds normal. No respiratory distress. She has no wheezes. She exhibits no tenderness.  Abdominal: Soft. Bowel sounds are normal. She exhibits no distension and no mass. There is no  tenderness. There is no rebound and no guarding.  Musculoskeletal: Normal range of motion. She exhibits no edema or tenderness.  Lymphadenopathy:    She has no cervical adenopathy.  Neurological: She is alert. A cranial nerve deficit is present. She exhibits abnormal muscle tone.  L side hemineglect, slurred speech, L sided facial droop, 0/5 strength LLE and LUE.    Skin: Skin is warm and dry. No rash noted. She is not diaphoretic. No erythema. No pallor.  Nursing note and vitals reviewed.   ED Course  Procedures (including critical care time) Labs Review Labs Reviewed  CBC - Abnormal; Notable for the following:    RDW 16.5 (*)    All other components within normal limits  I-STAT CHEM 8, ED - Abnormal; Notable for the following:    BUN 25 (*)    Creatinine, Ser 1.30 (*)    Calcium, Ion 1.10 (*)    Hemoglobin 15.3 (*)    All other components within normal limits  PROTIME-INR  APTT  DIFFERENTIAL  ETHANOL  COMPREHENSIVE METABOLIC PANEL  URINE RAPID DRUG SCREEN, HOSP PERFORMED  URINALYSIS, ROUTINE W REFLEX MICROSCOPIC (NOT AT [facility])  I-STAT TROPOININ, ED  CBG MONITORING, ED    Imaging Review Ct Head Wo Contrast  08/02/2015  CLINICAL DATA:  Code stroke. Acute onset of left-sided facial droop and left-sided weakness. Initial encounter. EXAM: CT HEAD WITHOUT CONTRAST TECHNIQUE: Contiguous axial images were obtained from the base of the skull through the vertex without intravenous contrast. COMPARISON:  CT of the head performed 04/23/2015, and MRI of the brain performed 06/12/2015 FINDINGS: There is no evidence of acute infarction, mass lesion, or intra- or extra-axial hemorrhage on CT. Prominence of the ventricles and sulci reflects mild to moderate cortical volume loss. Cerebellar atrophy is noted, with chronic infarcts noted bilaterally at the cerebellar hemispheres. Mild periventricular white matter change likely reflects small vessel ischemic microangiopathy. Chronic lacunar infarcts  are seen at the thalami bilaterally. The brainstem and fourth ventricle are within normal limits. The cerebral hemispheres demonstrate grossly normal gray-white differentiation. No mass effect or midline shift is seen. There is no evidence of fracture; visualized osseous structures are unremarkable in appearance. Postoperative change is suggested at both orbits. The paranasal sinuses and mastoid air cells are well-aerated. No significant soft tissue abnormalities are seen. IMPRESSION: 1. No acute intracranial pathology seen on CT. 2. Mild to moderate cortical volume loss and scattered small vessel ischemic microangiopathy. 3. Cerebellar atrophy, with chronic infarcts bilaterally at the cerebellar hemispheres. 4. Chronic lacunar infarcts at the thalami bilaterally. These results were called by telephone at the time of interpretation on 08/02/2015 at 5:23 am to Dr. Everlene Balls, who verbally acknowledged these results. These results were called by telephone at the time of interpretation  on 08/02/2015 at 5:23 am to Dr. Everlene Balls, who verbally acknowledged these results. Electronically Signed   By: Garald Balding M.D.   On: 08/02/2015 05:27   I have personally reviewed and evaluated these images and lab results as part of my medical decision-making.   EKG Interpretation   Date/Time:  Thursday August 02 2015 05:28:19 EST Ventricular Rate:  64 PR Interval:  167 QRS Duration: 127 QT Interval:  448 QTC Calculation: 462 R Axis:   -80 Text Interpretation:  Sinus rhythm RBBB and LAFB Probable left ventricular  hypertrophy tachycardia now resolved Confirmed by Glynn Octave  [phone]) on 08/02/2015 5:52:33 AM      MDM   Final diagnoses:  Cerebrovascular accident (CVA) due to other mechanism [facility])       Patient presents to the ED for an acute stroke.  CT head reveals no bleeding.  Dr. Janann Colonel recommends for admission for further testing.  While we were both evaluating the patient, her symptoms  seemed to improved.  She is almost back to baseline now.  I spoke with Dr. Hal Hope who will accept the patient for further care.    Everlene Balls, MD 08/02/15 [phone]

## 2015-08-02 NOTE — Consult Note (Signed)
Stroke Consult Consulting Physician: Dr Claudine Mouton  Chief Complaint: left-sided weakness  HPI: Darlene Murray is an 82 y.o. female hx of prior CVA, HLD presenting with left-sided weakness. LSW was 2100 the evening before. She woke this morning around 400, noted left-sided weakness so EMS was called. Upon arrival to ED noted to be lethargic, neglecting left side and not moving left side. Within 15 minutes became more alert and noted to have improved movement of left side. Family reports patient lives at home, at baseline typically walks with a walker or cane.   CT head imaging reviewed, shows no acute process but signs of multiple chronic infarcts.   Date last known well: 1/25 Time last known well: 2100 tPA Given: no, outside tPA window Modified Rankin: Rankin Score=3  Past Medical History  Diagnosis Date  . Blindness   . Asthmatic bronchitis   . Asthma   . Fibrocystic breast disease   . Allergic rhinitis   . Hyperlipidemia   . Venous insufficiency     lower extremities  . HNP (herniated nucleus pulposus), lumbar     L4-L5  . Left ventricular hypertrophy 2008    mild  . Antibiotic-associated diarrhea   . Stroke (cerebrum) [facility])     Past Surgical History  Procedure Laterality Date  . Cataract extraction    . Colonoscopy      Family History  Problem Relation Age of Onset  . Heart attack Sister   . Alzheimer's disease Sister   . Asthma Son    Social History:  reports that she has been passively smoking.  She has never used smokeless tobacco. She reports that she does not drink alcohol or use illicit drugs.  Allergies: No Known Allergies   (Not in a hospital admission)  ROS: Out of a complete 14 system review, the patient complains of only the following symptoms, and all other reviewed systems are negative. +weakness   Physical Examination: Filed Vitals:   08/02/15 0531  BP: 133/75  Pulse: 68  Temp: 97.8 F (36.6 C)  Resp: 19   Physical Exam  Constitutional: He  appears well-developed and well-nourished.  Psych: Affect appropriate to situation Eyes: No scleral injection HENT: No OP obstrucion Head: Normocephalic.  Cardiovascular: Normal rate and regular rhythm.  Respiratory: Effort normal and breath sounds normal.  GI: Soft. Bowel sounds are normal. No distension. There is no tenderness.  Skin: WDI   Neurologic Examination: Mental Status: Lethargic but aroused to voice, oriented to name, age but difficulty with location and date. Follows simple commands. No signs of aphasia. Mild dysarthria.  Cranial Nerves: II: optic discs not visualized, decreased blink to threat on left, pupils equal, round, reactive to light III,IV, VI: ptosis not present, impaired lateral movement to the left V,VII: left-sided facial weakness, facial light touch sensation normal bilaterally VIII: hearing normal bilaterally IX,X: gag reflex present XI: trapezius strength/neck flexion strength normal bilaterally XII: tongue strength normal  Motor: RUE limited proximal movement (chronic), mild spasticity, distally appears 5/5 LUE proximal 4+/5, distal 5-/5 RLE proximal 5-/5, distal 5/5 LLE proximal 4-/5, distal 4+/5 Sensory: withdrawals to noxious stimuli in all extremities Deep Tendon Reflexes: brisk reflexes throughout Plantars: Right: downgoing   Left: downgoing Cerebellar: Impaired FTN on the left Gait: deferred  Laboratory Studies:   Basic Metabolic Panel:  Recent Labs Lab 08/02/15 0518  NA 143  K 4.1  CL 107  GLUCOSE 82  BUN 25*  CREATININE 1.30*    Liver Function Tests: No results for input(s): AST,  ALT, ALKPHOS, BILITOT, PROT, ALBUMIN in the last 168 hours. No results for input(s): LIPASE, AMYLASE in the last 168 hours. No results for input(s): AMMONIA in the last 168 hours.  CBC:  Recent Labs Lab 08/02/15 0513 08/02/15 0518  WBC 9.1  --   NEUTROABS 3.7  --   HGB 13.7 15.3*  HCT 42.3 45.0  MCV 88.5  --   PLT 230  --      Cardiac Enzymes: No results for input(s): CKTOTAL, CKMB, CKMBINDEX, TROPONINI in the last 168 hours.  BNP: Invalid input(s): POCBNP  CBG:  Recent Labs Lab 08/02/15 0524  GLUCAP 80    Microbiology: Results for orders placed or performed during the hospital encounter of 04/23/15  Blood Culture (routine x 2)     Status: None   Collection Time: 04/23/15 10:09 AM  Result Value Ref Range Status   Specimen Description BLOOD RIGHT ARM  Final   Special Requests BOTTLES DRAWN AEROBIC AND ANAEROBIC 5CC  Final   Culture NO GROWTH 5 DAYS  Final   Report Status 04/28/2015 FINAL  Final  Blood Culture (routine x 2)     Status: None   Collection Time: 04/23/15 10:13 AM  Result Value Ref Range Status   Specimen Description BLOOD RIGHT HAND  Final   Special Requests BOTTLES DRAWN AEROBIC AND ANAEROBIC 5CC  Final   Culture  Setup Time   Final    GRAM NEGATIVE RODS IN BOTH AEROBIC AND ANAEROBIC BOTTLES CRITICAL RESULT CALLED TO, READ BACK BY AND VERIFIED WITH: B REAP RN [id number] 04/23/15 A BROWNING    Culture ESCHERICHIA COLI  Final   Report Status 04/26/2015 FINAL  Final   Organism ID, Bacteria ESCHERICHIA COLI  Final      Susceptibility   Escherichia coli - MIC*    AMPICILLIN >=32 RESISTANT Resistant     CEFAZOLIN <=4 SENSITIVE Sensitive     CEFEPIME <=1 SENSITIVE Sensitive     CEFTAZIDIME <=1 SENSITIVE Sensitive     CEFTRIAXONE <=1 SENSITIVE Sensitive     CIPROFLOXACIN <=0.25 SENSITIVE Sensitive     GENTAMICIN <=1 SENSITIVE Sensitive     IMIPENEM <=0.25 SENSITIVE Sensitive     TRIMETH/SULFA >=320 RESISTANT Resistant     AMPICILLIN/SULBACTAM 16 INTERMEDIATE Intermediate     PIP/TAZO <=4 SENSITIVE Sensitive     * ESCHERICHIA COLI  Urine culture     Status: None   Collection Time: 04/23/15 11:06 AM  Result Value Ref Range Status   Specimen Description URINE, RANDOM  Final   Special Requests NONE  Final   Culture >=100,000 COLONIES/mL ESCHERICHIA COLI  Final   Report Status  04/25/2015 FINAL  Final   Organism ID, Bacteria ESCHERICHIA COLI  Final      Susceptibility   Escherichia coli - MIC*    AMPICILLIN >=32 RESISTANT Resistant     CEFAZOLIN <=4 SENSITIVE Sensitive     CEFTRIAXONE <=1 SENSITIVE Sensitive     CIPROFLOXACIN <=0.25 SENSITIVE Sensitive     GENTAMICIN <=1 SENSITIVE Sensitive     IMIPENEM <=0.25 SENSITIVE Sensitive     NITROFURANTOIN <=16 SENSITIVE Sensitive     TRIMETH/SULFA >=320 RESISTANT Resistant     AMPICILLIN/SULBACTAM >=32 RESISTANT Resistant     PIP/TAZO <=4 SENSITIVE Sensitive     * >=100,000 COLONIES/mL ESCHERICHIA COLI  MRSA PCR Screening     Status: None   Collection Time: 04/23/15  7:07 PM  Result Value Ref Range Status   MRSA by PCR NEGATIVE NEGATIVE Final  Comment:        The GeneXpert MRSA Assay (FDA approved for NASAL specimens only), is one component of a comprehensive MRSA colonization surveillance program. It is not intended to diagnose MRSA infection nor to guide or monitor treatment for MRSA infections.   Urine culture     Status: None   Collection Time: 04/27/15  2:45 AM  Result Value Ref Range Status   Specimen Description URINE, CLEAN CATCH  Final   Special Requests NONE  Final   Culture NO GROWTH 1 DAY  Final   Report Status 04/28/2015 FINAL  Final    Coagulation Studies:  Recent Labs  08/02/15 0513  LABPROT 13.9  INR 1.05    Urinalysis: No results for input(s): COLORURINE, LABSPEC, PHURINE, GLUCOSEU, HGBUR, BILIRUBINUR, KETONESUR, PROTEINUR, UROBILINOGEN, NITRITE, LEUKOCYTESUR in the last 168 hours.  Invalid input(s): APPERANCEUR  Lipid Panel:     Component Value Date/Time   CHOL 161 04/07/2015 0622   TRIG 47 04/07/2015 0622   HDL 48 04/07/2015 0622   CHOLHDL 3.4 04/07/2015 0622   VLDL 9 04/07/2015 0622   LDLCALC 104* 04/07/2015 0622    HgbA1C:  Lab Results  Component Value Date   HGBA1C 6.0* 04/07/2015    Urine Drug Screen:  No results found for: LABOPIA, COCAINSCRNUR,  LABBENZ, AMPHETMU, THCU, LABBARB  Alcohol Level: No results for input(s): ETH in the last 168 hours.  Other results:  Imaging: Ct Head Wo Contrast  08/02/2015  CLINICAL DATA:  Code stroke. Acute onset of left-sided facial droop and left-sided weakness. Initial encounter. EXAM: CT HEAD WITHOUT CONTRAST TECHNIQUE: Contiguous axial images were obtained from the base of the skull through the vertex without intravenous contrast. COMPARISON:  CT of the head performed 04/23/2015, and MRI of the brain performed 06/12/2015 FINDINGS: There is no evidence of acute infarction, mass lesion, or intra- or extra-axial hemorrhage on CT. Prominence of the ventricles and sulci reflects mild to moderate cortical volume loss. Cerebellar atrophy is noted, with chronic infarcts noted bilaterally at the cerebellar hemispheres. Mild periventricular white matter change likely reflects small vessel ischemic microangiopathy. Chronic lacunar infarcts are seen at the thalami bilaterally. The brainstem and fourth ventricle are within normal limits. The cerebral hemispheres demonstrate grossly normal gray-white differentiation. No mass effect or midline shift is seen. There is no evidence of fracture; visualized osseous structures are unremarkable in appearance. Postoperative change is suggested at both orbits. The paranasal sinuses and mastoid air cells are well-aerated. No significant soft tissue abnormalities are seen. IMPRESSION: 1. No acute intracranial pathology seen on CT. 2. Mild to moderate cortical volume loss and scattered small vessel ischemic microangiopathy. 3. Cerebellar atrophy, with chronic infarcts bilaterally at the cerebellar hemispheres. 4. Chronic lacunar infarcts at the thalami bilaterally. These results were called by telephone at the time of interpretation on 08/02/2015 at 5:23 am to Dr. Everlene Balls, who verbally acknowledged these results. These results were called by telephone at the time of interpretation on  08/02/2015 at 5:23 am to Dr. Everlene Balls, who verbally acknowledged these results. Electronically Signed   By: Garald Balding M.D.   On: 08/02/2015 05:27    Assessment: 82 y.o. female hx of HLD, prior strokes presenting as code stroke with left-sided weakness. Code stroke activated. Patient out of tPA window. Not ideal IR candidate as LSW > 8 hrs ago and mRS of 3. Initial evaluation in the ED was concerning for possible R MCA infarct, symptoms rapidly improving.   She has been evaluated by neurology  and cardiology in the past for suspected embolic strokes. At that time decision was made to not start coumadin or NOAC due to fall/bleeding risk.    Plan: 1. HgbA1c, fasting lipid panel 2. MRI, MRA  of the brain without contrast 3. PT consult, OT consult, Speech consult 4. Echocardiogram 5. Carotid dopplers 6. Prophylactic therapy-ASA 81mg  and Plavix 75mg  daily 7. Risk factor modification 8. Telemetry monitoring 9. Frequent neuro checks 10. NPO until RN stroke swallow screen   Jim Like, DO [facility] [phone]  If 7pm- 7am, please page neurology on call as listed in [place]. 08/02/2015, 5:53 AM

## 2015-08-02 NOTE — ED Notes (Signed)
EDP and neuro at bedside, pt initially hard to arouse, couldn't talk couldn't move L side. Pt now able to speak, A/O X4, moving L side.

## 2015-08-02 NOTE — Care Management Note (Signed)
Case Management Note  Patient Details  Name: Darlene Murray MRN: [id number] Date of Birth: [birth year]  Subjective/Objective:                    Action/Plan: Patient presented to the ED with right sided weakness. Lives at home with husband. Will follow for discharge needs pending PT/OT evals and physician orders.  Expected Discharge Date:                  Expected Discharge Plan:     In-House Referral:     Discharge planning Services     Post Acute Care Choice:    Choice offered to:     DME Arranged:    DME Agency:     HH Arranged:    HH Agency:     Status of Service:  In process, will continue to follow  Medicare Important Message Given:    Date Medicare IM Given:    Medicare IM give by:    Date Additional Medicare IM Given:    Additional Medicare Important Message give by:     If discussed at [place] of Stay Meetings, dates discussed:    Additional CommentsRolm Baptise, RN 08/02/2015, 10:18 AM [phone]

## 2015-08-02 NOTE — Progress Notes (Signed)
Patient admitted from ER. Patient alert and oriented x 4. Patient oriented to room and sons at the bedside. Tele placed and was comfirmed

## 2015-08-02 NOTE — Progress Notes (Signed)
Code Stroke called on 82 y.o female. Per EMS called to her home this morning for acute weakness in left arm with slurred speech and left facial droop. Pt went to bed at 2100 last night per family , LSN 2100. Initially minimally responsive, intubation considered, cleared for CT per EDP.  Pt taken to CT scan STAT. negative for acute stroke or bleed yet multiple chronic infarcts present per Neurologist Dr. Janann Colonel. Upon arrival to ED room Pt more responsive. CBG 80. NIHSS completed yielding 20 for impaired LOC, right gaze, left side visual deficits, left side neglect, impaired strength on left side,  impaired sensory, dysarthria and mild aphasia. Within 15 minutes Pt more alert, improved orientation,  able to move left side without impairment. NIHSS reassessed yielding 8. See neuro flow sheet for NIHSS assessments. Pt symptoms rapidly improving and well out of window for TPA. For admit to [facility] for stroke work up. Family updated at bedside per Neurologist and EDP.

## 2015-08-02 NOTE — Plan of Care (Signed)
82 year old female presents to the ER because of left-sided weakness and left hemi-neglect. CT of the head was negative and patient has been admitted for further stroke workup.  Darlene Murray.

## 2015-08-02 NOTE — Progress Notes (Signed)
Called dietary to let them know patient has not received her tray, was told the food will be here soon.

## 2015-08-02 NOTE — ED Notes (Signed)
Pt in EMS from home reporting L sided weakness, facial droop. LNW 9pm

## 2015-08-03 DIAGNOSIS — M4712 Other spondylosis with myelopathy, cervical region: Secondary | ICD-10-CM | POA: Diagnosis not present

## 2015-08-03 DIAGNOSIS — M6289 Other specified disorders of muscle: Secondary | ICD-10-CM | POA: Diagnosis not present

## 2015-08-03 DIAGNOSIS — N179 Acute kidney failure, unspecified: Secondary | ICD-10-CM | POA: Diagnosis not present

## 2015-08-03 DIAGNOSIS — R29810 Facial weakness: Secondary | ICD-10-CM | POA: Diagnosis not present

## 2015-08-03 DIAGNOSIS — R4781 Slurred speech: Secondary | ICD-10-CM | POA: Diagnosis not present

## 2015-08-03 DIAGNOSIS — R531 Weakness: Secondary | ICD-10-CM | POA: Diagnosis not present

## 2015-08-03 DIAGNOSIS — G8194 Hemiplegia, unspecified affecting left nondominant side: Secondary | ICD-10-CM | POA: Diagnosis not present

## 2015-08-03 DIAGNOSIS — I639 Cerebral infarction, unspecified: Secondary | ICD-10-CM | POA: Diagnosis not present

## 2015-08-03 LAB — BASIC METABOLIC PANEL
ANION GAP: 7 (ref 5–15)
BUN: 12 mg/dL (ref 6–20)
CALCIUM: 8.6 mg/dL — AB (ref 8.9–10.3)
CO2: 26 mmol/L (ref 22–32)
Chloride: 110 mmol/L (ref 101–111)
Creatinine, Ser: 0.94 mg/dL (ref 0.44–1.00)
GFR calc Af Amer: 60 mL/min — ABNORMAL LOW (ref >60)
GFR, EST NON AFRICAN AMERICAN: 51 mL/min — AB (ref >60)
Glucose, Bld: 74 mg/dL (ref 65–99)
POTASSIUM: 3.9 mmol/L (ref 3.5–5.1)
SODIUM: 143 mmol/L (ref 135–145)

## 2015-08-03 LAB — LIPID PANEL
Cholesterol: 111 mg/dL (ref 0–200)
HDL: 47 mg/dL (ref >40)
LDL CALC: 53 mg/dL (ref 0–99)
Total CHOL/HDL Ratio: 2.4 RATIO
Triglycerides: 54 mg/dL (ref <150)
VLDL: 11 mg/dL (ref 0–40)

## 2015-08-03 NOTE — Care Management Obs Status (Signed)
[place] NOTIFICATION   Patient Details  Name: Darlene Murray MRN: [id number] Date of Birth: Jan 06, 1922   Medicare Observation Status Notification Given:  Yes    Pollie Friar, RN 08/03/2015, 12:18 PM

## 2015-08-03 NOTE — Evaluation (Signed)
Physical Therapy Evaluation Patient Details Name: Darlene Murray MRN: [id number] DOB: 1922/04/26 Today's Date: 08/03/2015   History of Present Illness  Pt is a 82 y.o. female hx of prior CVA, HLD presenting with left-sided weakness. PMH: Blindness, Asthma, Stroke (cerebrum)   Clinical Impression  Patient presents with problems listed below.  Patient to be discharged home with 24 hour assist.  Recommend HHPT at discharge for continued therapy.    Follow Up Recommendations Home health PT;Supervision/Assistance - 24 hour    Equipment Recommendations  None recommended by PT    Recommendations for Other Services       Precautions / Restrictions Precautions Precautions: Fall Restrictions Weight Bearing Restrictions: No      Mobility  Bed Mobility Overal bed mobility: Needs Assistance Bed Mobility: Supine to Sit;Sit to Supine     Supine to sit: Min guard;HOB elevated Sit to supine: Min guard   General bed mobility comments: Increased time required.  Assist for safety.  Transfers Overall transfer level: Needs assistance Equipment used: Rolling walker (2 wheeled) Transfers: Sit to/from Stand Sit to Stand: Min assist         General transfer comment: Assist to steady during transfers.  Ambulation/Gait Ambulation/Gait assistance: Min assist Ambulation Distance (Feet): 62 Feet Assistive device: Rolling walker (2 wheeled) Gait Pattern/deviations: Step-through pattern;Decreased step length - right;Decreased step length - left;Decreased stride length;Trendelenburg;Trunk flexed Gait velocity: decreased Gait velocity interpretation: Below normal speed for age/gender General Gait Details: Verbal cues for safe use of RW.  Occasional assist to maneuver RW to avoid obstacles.  Flexed posture.  Unsteady gait.  Stairs            Wheelchair Mobility    Modified Rankin (Stroke Patients Only) Modified Rankin (Stroke Patients Only) Pre-Morbid Rankin Score: Moderate  disability Modified Rankin: Moderately severe disability     Balance Overall balance assessment: Needs assistance Sitting-balance support: Bilateral upper extremity supported;No upper extremity supported Sitting balance-Leahy Scale: Fair     Standing balance support: Bilateral upper extremity supported Standing balance-Leahy Scale: Poor Standing balance comment: Requires min assist for balance in standing with no UE support                             Pertinent Vitals/Pain Pain Assessment: No/denies pain    Home Living Family/patient expects to be discharged to:: Private residence Living Arrangements: Spouse/significant other Available Help at Discharge: Family;Available 24 hours/day (Husband and son) Type of Home: House Home Access: Level entry     Home Layout: One level Home Equipment: Walker - 2 wheels;Cane - single point;Grab bars - tub/shower;Shower seat;Bedside commode;Wheelchair - manual      Prior Function Level of Independence: Needs assistance   Gait / Transfers Assistance Needed: supervision ambulation with RW household distances  ADL's / Homemaking Assistance Needed: husband reports he provides min assist with bathing; "lets her get all the areas she can before he helps"        Hand Dominance   Dominant Hand: Right    Extremity/Trunk Assessment   Upper Extremity Assessment: Defer to OT evaluation RUE Deficits / Details: Decreased shoulder AROM, decreased strength in shoulder. Elbow, wrist, hand ROM and strength WFL. Pt reports shoulder problems PTA.         Lower Extremity Assessment: Generalized weakness      Cervical / Trunk Assessment: Kyphotic  Communication   Communication: No difficulties  Cognition Arousal/Alertness: Awake/alert Behavior During Therapy: WFL for tasks assessed/performed Overall Cognitive  Status: History of cognitive impairments - at baseline                      General Comments      Exercises         Assessment/Plan    PT Assessment Patient needs continued PT services;All further PT needs can be met in the next venue of care  PT Diagnosis Difficulty walking;Abnormality of gait;Generalized weakness   PT Problem List Decreased strength;Decreased balance;Decreased mobility  PT Treatment Interventions     PT Goals (Current goals can be found in the Care Plan section) Acute Rehab PT Goals Patient Stated Goal: to go home PT Goal Formulation: All assessment and education complete, DC therapy (Patient to have f/u HHPT for continued therapy)    Frequency     Barriers to discharge        Co-evaluation               End of Session Equipment Utilized During Treatment: Gait belt Activity Tolerance: Patient tolerated treatment well Patient left: in bed;with call bell/phone within reach;with family/visitor present (sitting EOB) Nurse Communication: Mobility status (Ready for d/c from PT perspective with f/u HHPT)    Functional Assessment Tool Used: Clinical judgement Functional Limitation: Mobility: Walking and moving around Mobility: Walking and Moving Around Current Status (I3568): At least 20 percent but less than 40 percent impaired, limited or restricted Mobility: Walking and Moving Around Goal Status [phone]): At least 20 percent but less than 40 percent impaired, limited or restricted Mobility: Walking and Moving Around Discharge Status [phone]): At least 20 percent but less than 40 percent impaired, limited or restricted    Time: 1115-5208 PT Time Calculation (min) (ACUTE ONLY): 16 min   Charges:   PT Evaluation $PT Eval Moderate Complexity: 1 Procedure     PT G Codes:   PT G-Codes **NOT FOR INPATIENT CLASS** Functional Assessment Tool Used: Clinical judgement Functional Limitation: Mobility: Walking and moving around Mobility: Walking and Moving Around Current Status (Y2233): At least 20 percent but less than 40 percent impaired, limited or  restricted Mobility: Walking and Moving Around Goal Status [phone]): At least 20 percent but less than 40 percent impaired, limited or restricted Mobility: Walking and Moving Around Discharge Status [phone]): At least 20 percent but less than 40 percent impaired, limited or restricted    Despina Pole 08/03/2015, 1:48 PM Carita Pian. Sanju[place]ava, [place] Pager [phone]

## 2015-08-03 NOTE — Progress Notes (Signed)
STROKE TEAM PROGRESS NOTE   HISTORY OF PRESENT ILLNESS Darlene Murray is an 82 y.o. female hx of prior CVA, HLD presenting with left-sided weakness. LKW was 2100 the evening before 08/01/2015. She woke this morning around 400, noted left-sided weakness so EMS was called. Upon arrival to ED noted to be lethargic, neglecting left side and not moving left side. Within 15 minutes became more alert and noted to have improved movement of left side. Family reports patient lives at home, at baseline typically walks with a walker or cane. CT head imaging reviewed, shows no acute process but signs of multiple chronic infarcts. Modified Rankin: Rankin Score=3. Patient was not administered TPA secondary to outside tPA window. She was admitted for further evaluation and treatment.   SUBJECTIVE (INTERVAL HISTORY) Her husband is at the bedside.  Overall she feels her condition is stable. She is pleasant. Wants to go home. She has been ambulating with physical therapy   OBJECTIVE Temp:  [97.6 F (36.4 C)-98.4 F (36.9 C)] 97.6 F (36.4 C) (01/27 1001) Pulse Rate:  [62-79] 79 (01/27 1001) Cardiac Rhythm:  [-] Normal sinus rhythm (01/27 0747) Resp:  [14-18] 18 (01/27 1001) BP: (115-133)/(45-75) 133/45 mmHg (01/27 1001) SpO2:  [96 %-99 %] 96 % (01/27 1001)  CBC:   Recent Labs Lab 08/02/15 0513 08/02/15 0518  WBC 9.1  --   NEUTROABS 3.7  --   HGB 13.7 15.3*  HCT 42.3 45.0  MCV 88.5  --   PLT 230  --     Basic Metabolic Panel:   Recent Labs Lab 08/02/15 0513 08/02/15 0518 08/03/15 0230  NA 144 143 143  K 4.1 4.1 3.9  CL 110 107 110  CO2 26  --  26  GLUCOSE 88 82 74  BUN 19 25* 12  CREATININE 1.31* 1.30* 0.94  CALCIUM 8.8*  --  8.6*    Lipid Panel:     Component Value Date/Time   CHOL 111 08/03/2015 0230   TRIG 54 08/03/2015 0230   HDL 47 08/03/2015 0230   CHOLHDL 2.4 08/03/2015 0230   VLDL 11 08/03/2015 0230   LDLCALC 53 08/03/2015 0230   HgbA1c:  Lab Results  Component Value  Date   HGBA1C 6.0* 04/07/2015   Urine Drug Screen:     Component Value Date/Time   LABOPIA NONE DETECTED 08/02/2015 0553   COCAINSCRNUR NONE DETECTED 08/02/2015 0553   LABBENZ NONE DETECTED 08/02/2015 0553   AMPHETMU NONE DETECTED 08/02/2015 0553   THCU NONE DETECTED 08/02/2015 0553   LABBARB NONE DETECTED 08/02/2015 0553      IMAGING  Ct Head Wo Contrast  08/02/2015  CLINICAL DATA:  Code stroke. Acute onset of left-sided facial droop and left-sided weakness. Initial encounter. EXAM: CT HEAD WITHOUT CONTRAST TECHNIQUE: Contiguous axial images were obtained from the base of the skull through the vertex without intravenous contrast. COMPARISON:  CT of the head performed 04/23/2015, and MRI of the brain performed 06/12/2015 FINDINGS: There is no evidence of acute infarction, mass lesion, or intra- or extra-axial hemorrhage on CT. Prominence of the ventricles and sulci reflects mild to moderate cortical volume loss. Cerebellar atrophy is noted, with chronic infarcts noted bilaterally at the cerebellar hemispheres. Mild periventricular white matter change likely reflects small vessel ischemic microangiopathy. Chronic lacunar infarcts are seen at the thalami bilaterally. The brainstem and fourth ventricle are within normal limits. The cerebral hemispheres demonstrate grossly normal gray-white differentiation. No mass effect or midline shift is seen. There is no evidence of fracture;  visualized osseous structures are unremarkable in appearance. Postoperative change is suggested at both orbits. The paranasal sinuses and mastoid air cells are well-aerated. No significant soft tissue abnormalities are seen. IMPRESSION: 1. No acute intracranial pathology seen on CT. 2. Mild to moderate cortical volume loss and scattered small vessel ischemic microangiopathy. 3. Cerebellar atrophy, with chronic infarcts bilaterally at the cerebellar hemispheres. 4. Chronic lacunar infarcts at the thalami bilaterally. These  results were called by telephone at the time of interpretation on 08/02/2015 at 5:23 am to Dr. Everlene Balls, who verbally acknowledged these results. These results were called by telephone at the time of interpretation on 08/02/2015 at 5:23 am to Dr. Everlene Balls, who verbally acknowledged these results. Electronically Signed   By: Garald Balding M.D.   On: 08/02/2015 05:27   Mr Brain Wo Contrast  08/03/2015  CLINICAL DATA:  Initial valuation for acute left-sided weakness. EXAM: MRI HEAD WITHOUT CONTRAST TECHNIQUE: Multiplanar, multiecho pulse sequences of the brain and surrounding structures were obtained without intravenous contrast. COMPARISON:  Prior a CT from earlier the same day. FINDINGS: Study degraded by motion artifact. Subtle patchy diffusion abnormality present within the parasagittal left occipital lobe with associated T2/FLAIR hyperintensity without significant ADC correlate, compatible with subacute infarct. There was evidence of ischemia within this region on prior brain MRI from 06/12/2015, although the diffusion abnormality within this region is larger in size as compared to prior study. A single small 5 mm focus of diffusion abnormality within this region may be slightly more recent in nature, although stool felt to be subacute (series 3, image 23). Tiny 6 mm diffusion abnormality within the left thalamus present, consistent with evolving late subacute infarcts. There was evidence ischemia within this region on prior study. Additional patchy mildly increased diffusion abnormality within the right middle cerebellar peduncle, left dorsal pons, and left cerebellar peduncle also consistent with evolving subacute infarcts. There was evidence of ischemia within these regions on prior study. There is subtle patchy diffusion abnormality within the ventral right thalamus without ADC correlate, also consistent with subacute ischemia. This is new from prior study. No other abnormal foci of restricted  diffusion to suggest acute ischemia. Diffuse prominence of the CSF containing spaces is compatible with generalized age-related cerebral atrophy. Patchy T2/FLAIR hyperintensity within the periventricular white matter most consistent with chronic small vessel ischemic disease, mild for patient age. Multiple remote lacunar infarcts present within the bilateral thalami, left greater than right. Multiple remote bilateral cerebellar infarcts present. Scatter remote lacunar infarcts involve the midbrain and pons. Progressive encephalomalacia at the late subacute to chronic infarct at the right middle cerebellar peduncle. No acute intracranial hemorrhage. No mass lesion, midline shift, or mass effect. Ventricular prominence related to global parenchymal volume loss present without hydrocephalus. No extra-axial fluid collection. Major intracranial vascular flow voids are maintained. Vertebrobasilar system is diminutive. Craniocervical junction within normal limits. Multilevel degenerative spondylolysis present within the upper cervical spine with mild diffuse stenosis. Incidental note made of an empty sella. No acute abnormality about the orbits. Sequela prior bilateral lens extraction noted. Axial myopia on the right. Scattered mucosal thickening throughout the paranasal sinuses. Right sphenoid sinus is completely opacified, likely chronic. No mastoid effusion. Inner ear structures within normal limits. Bone marrow signal intensity within normal limits. No scalp soft tissue abnormality. IMPRESSION: 1. Continued interval evolution of multi focal late subacute to chronic infarcts involving the left occipital lobe, left thalamus, left pons, left cerebellar hemisphere, and right middle cerebellar peduncle. 2. Subtle new patchy diffusion  signal abnormality within ventral right thalamus, consistent with subacute ischemia. While this is subacute in nature, this is new relative to most recent MRI 06/12/2015. 3. Otherwise stable  appearance of the brain with cerebral atrophy and multiple remote infarcts as above. Electronically Signed   By: Jeannine Boga M.D.   On: 08/03/2015 00:55       PHYSICAL EXAM Pleasant elderly African-American lady currently not in distress. . Afebrile. Head is nontraumatic. Neck is supple without bruit.    Cardiac exam no murmur or gallop. Lungs are clear to auscultation. Distal pulses are well felt. Neurological Exam :  Awake alert oriented x 3 normal speech and language. Diminished attention, registration and recall. Follows only simple one-step and midline commands Mild left lower face asymmetry. Tongue midline. No drift. Mild diminished fine finger movements on left. Orbits right over left upper extremity. Mild left grip weak.. Increased tone on the left side. Normal sensation . Normal coordination. Gait not tested. ASSESSMENT/PLAN Ms. Kailie Kuipers is a 82 y.o. female with history of blindness, asthma, recurrent stroke and TIA, memory loss, LVH, HLD presenting with left sided weakness. She did not receive IV t-PA due to being outside the window.   Right brain TIA.  MRI  multi focal late subacute to chronic infarcts involving the left occipital lobe, left thalamus, left pons, left cerebellar hemisphere, and right middle cerebellar  Peduncle.No definite acute infarct  LDL 104 in Oct  HgbA1c 6.0 in Oct  Lovenox 40 mg sq daily for VTE prophylaxis Diet Heart Room service appropriate?: Yes; Fluid consistency:: Thin Diet - low sodium heart healthy  aspirin 81 mg daily and clopidogrel 75 mg daily prior to admission, now on aspirin 81 mg daily and clopidogrel 75 mg daily  Patient counseled to be compliant with her antithrombotic medications  Ongoing aggressive stroke risk factor management  Therapy recommendations:  Home Health  Disposition:  pending (lives w. Husband, uses w/c and walker at home. Cannot be left alone)  Followed by Dr. Krista Blue as an OP    Hypertension  Stable  Hyperlipidemia  Home meds:  Pravachol 20, resumed in hospital  LDL 104 in Oct, goal < 70  Continue statin at discharge  Other Stroke Risk Factors  Advanced age  Cigarette smoker, advised to stop smoking  Hx stroke/TIA  06/2015  posterior circulation infarct, embolic. Placed on asa and plavix, 30 d OP monitor requested along with TEE, follow up by cardiology 06/27/15 - pt had 2 falls day prior and is not interested in TEE or Loop. Card did not feel monitor would be of benefit  03/2015 Bilateral hemorrhagic and nonhemorrhagic infarcts probably embolic from an unknown source. Suspect basilar clot due to atrial fibrillation, no TEE/loop as not a coumadin candidate,   Other Active Problems  Mild cognitive impairment, PTA, likely vascular component. Followed by Dr. Krista Blue  Gait difficulty PTA, multifactorial due to recurrent stroke and cervical spondylitic myelopathy. Followed by Dr. Jilda Roche day # 1   ined this patient, reviewed notes, independently viewed imaging studies, participated in medical decision making and plan of care. I have made any additions or clarifications directly to the above note. Follow-up with Dr. Krista Blue as outpatient Antony Contras, MD Medical Director [place] Pager: [phone] 08/03/2015 4:18 PM    To contact Stroke Continuity provider, please refer to [email or web address] After hours, contact General Neurology

## 2015-08-03 NOTE — Discharge Summary (Addendum)
PATIENT DETAILS Name: Darlene Murray Age: 82 y.o. Sex: female Date of Birth: Jul 26, 1921 MRN: [id number]. Admitting Physician: Rise Patience, MD [id number] Marigene Ehlers, MD  Admit Date: 08/02/2015 Discharge date: 08/03/2015  Recommendations for Outpatient Follow-up:  1. Ensure follow up with neurology. 2. Please repeat CBC/BMET at next visit  PRIMARY DISCHARGE DIAGNOSIS:  Principal Problem: Acute/Sub acute CVA Active Problems:   Severe persistent asthma in adult steroid dependent   Hyperlipidemia   Left ventricular diastolic dysfunction with preserved systolic function (HCC)   History of mixed hemorraghic embolic stroke   Memory loss   Acute prerenal azotemia   CKD (chronic kidney disease), stage III   CVA (cerebral infarction)      PAST MEDICAL HISTORY: Past Medical History  Diagnosis Date  . Blindness   . Asthmatic bronchitis   . Asthma   . Fibrocystic breast disease   . Allergic rhinitis   . Hyperlipidemia   . Venous insufficiency     lower extremities  . HNP (herniated nucleus pulposus), lumbar     L4-L5  . Left ventricular hypertrophy 2008    mild  . Antibiotic-associated diarrhea   . Stroke (cerebrum) ([place])     DISCHARGE MEDICATIONS: Current Discharge Medication List    CONTINUE these medications which have NOT CHANGED   Details  albuterol (PROVENTIL HFA;VENTOLIN HFA) 108 (90 Base) MCG/ACT inhaler Inhale 2 puffs into the lungs every 6 (six) hours as needed for wheezing or shortness of breath. Qty: 1 Inhaler, Refills: 2    aspirin 81 MG tablet Take 81 mg by mouth daily.    calcium carbonate (OS-CAL) 600 MG TABS Take 600 mg by mouth daily.      cholecalciferol (VITAMIN D) 1000 UNITS tablet Take 1,000 Units by mouth daily.    clopidogrel (PLAVIX) 75 MG tablet Take 1 tablet (75 mg total) by mouth daily. Qty: 30 tablet, Refills: 11    diclofenac sodium (VOLTAREN) 1 % GEL Apply 2 g topically 4 (four) times daily. Qty: 3 Tube, Refills: 1      mometasone-formoterol (DULERA) 100-5 MCG/ACT AERO Inhale 2 puffs into the lungs 2 (two) times daily. Qty: 1 Inhaler, Refills: 2    Multiple Vitamin (MULTIVITAMIN) tablet Take 1 tablet by mouth daily.    pravastatin (PRAVACHOL) 20 MG tablet Take 1 tablet (20 mg total) by mouth daily at 6 PM. Qty: 90 tablet, Refills: 3    predniSONE (DELTASONE) 5 MG tablet Take 1 tablet (5 mg total) by mouth daily. Qty: 30 tablet, Refills: 5    pyridOXINE (VITAMIN B-6) 100 MG tablet Take 100 mg by mouth daily.        ALLERGIES:  No Known Allergies  BRIEF HPI:  See H&P, Labs, Consult and Test reports for all details in brief, patient was admitted for evaluation of left sided weakness.  CONSULTATIONS:   neurology  PERTINENT RADIOLOGIC STUDIES: Ct Head Wo Contrast  08/02/2015  CLINICAL DATA:  Code stroke. Acute onset of left-sided facial droop and left-sided weakness. Initial encounter. EXAM: CT HEAD WITHOUT CONTRAST TECHNIQUE: Contiguous axial images were obtained from the base of the skull through the vertex without intravenous contrast. COMPARISON:  CT of the head performed 04/23/2015, and MRI of the brain performed 06/12/2015 FINDINGS: There is no evidence of acute infarction, mass lesion, or intra- or extra-axial hemorrhage on CT. Prominence of the ventricles and sulci reflects mild to moderate cortical volume loss. Cerebellar atrophy is noted, with chronic infarcts noted bilaterally at the cerebellar hemispheres. Mild periventricular white matter  change likely reflects small vessel ischemic microangiopathy. Chronic lacunar infarcts are seen at the thalami bilaterally. The brainstem and fourth ventricle are within normal limits. The cerebral hemispheres demonstrate grossly normal gray-white differentiation. No mass effect or midline shift is seen. There is no evidence of fracture; visualized osseous structures are unremarkable in appearance. Postoperative change is suggested at both orbits. The paranasal  sinuses and mastoid air cells are well-aerated. No significant soft tissue abnormalities are seen. IMPRESSION: 1. No acute intracranial pathology seen on CT. 2. Mild to moderate cortical volume loss and scattered small vessel ischemic microangiopathy. 3. Cerebellar atrophy, with chronic infarcts bilaterally at the cerebellar hemispheres. 4. Chronic lacunar infarcts at the thalami bilaterally. These results were called by telephone at the time of interpretation on 08/02/2015 at 5:23 am to Dr. Everlene Balls, who verbally acknowledged these results. These results were called by telephone at the time of interpretation on 08/02/2015 at 5:23 am to Dr. Everlene Balls, who verbally acknowledged these results. Electronically Signed   By: Garald Balding M.D.   On: 08/02/2015 05:27   Mr Brain Wo Contrast  08/03/2015  CLINICAL DATA:  Initial valuation for acute left-sided weakness. EXAM: MRI HEAD WITHOUT CONTRAST TECHNIQUE: Multiplanar, multiecho pulse sequences of the brain and surrounding structures were obtained without intravenous contrast. COMPARISON:  Prior a CT from earlier the same day. FINDINGS: Study degraded by motion artifact. Subtle patchy diffusion abnormality present within the parasagittal left occipital lobe with associated T2/FLAIR hyperintensity without significant ADC correlate, compatible with subacute infarct. There was evidence of ischemia within this region on prior brain MRI from 06/12/2015, although the diffusion abnormality within this region is larger in size as compared to prior study. A single small 5 mm focus of diffusion abnormality within this region may be slightly more recent in nature, although stool felt to be subacute (series 3, image 23). Tiny 6 mm diffusion abnormality within the left thalamus present, consistent with evolving late subacute infarcts. There was evidence ischemia within this region on prior study. Additional patchy mildly increased diffusion abnormality within the right middle  cerebellar peduncle, left dorsal pons, and left cerebellar peduncle also consistent with evolving subacute infarcts. There was evidence of ischemia within these regions on prior study. There is subtle patchy diffusion abnormality within the ventral right thalamus without ADC correlate, also consistent with subacute ischemia. This is new from prior study. No other abnormal foci of restricted diffusion to suggest acute ischemia. Diffuse prominence of the CSF containing spaces is compatible with generalized age-related cerebral atrophy. Patchy T2/FLAIR hyperintensity within the periventricular white matter most consistent with chronic small vessel ischemic disease, mild for patient age. Multiple remote lacunar infarcts present within the bilateral thalami, left greater than right. Multiple remote bilateral cerebellar infarcts present. Scatter remote lacunar infarcts involve the midbrain and pons. Progressive encephalomalacia at the late subacute to chronic infarct at the right middle cerebellar peduncle. No acute intracranial hemorrhage. No mass lesion, midline shift, or mass effect. Ventricular prominence related to global parenchymal volume loss present without hydrocephalus. No extra-axial fluid collection. Major intracranial vascular flow voids are maintained. Vertebrobasilar system is diminutive. Craniocervical junction within normal limits. Multilevel degenerative spondylolysis present within the upper cervical spine with mild diffuse stenosis. Incidental note made of an empty sella. No acute abnormality about the orbits. Sequela prior bilateral lens extraction noted. Axial myopia on the right. Scattered mucosal thickening throughout the paranasal sinuses. Right sphenoid sinus is completely opacified, likely chronic. No mastoid effusion. Inner ear structures within normal limits.  Bone marrow signal intensity within normal limits. No scalp soft tissue abnormality. IMPRESSION: 1. Continued interval evolution of  multi focal late subacute to chronic infarcts involving the left occipital lobe, left thalamus, left pons, left cerebellar hemisphere, and right middle cerebellar peduncle. 2. Subtle new patchy diffusion signal abnormality within ventral right thalamus, consistent with subacute ischemia. While this is subacute in nature, this is new relative to most recent MRI 06/12/2015. 3. Otherwise stable appearance of the brain with cerebral atrophy and multiple remote infarcts as above. Electronically Signed   By: Jeannine Boga M.D.   On: 08/03/2015 00:55     PERTINENT LAB RESULTS: CBC:  Recent Labs  08/02/15 0513 08/02/15 0518  WBC 9.1  --   HGB 13.7 15.3*  HCT 42.3 45.0  PLT 230  --    CMET CMP     Component Value Date/Time   NA 143 08/03/2015 0230   K 3.9 08/03/2015 0230   CL 110 08/03/2015 0230   CO2 26 08/03/2015 0230   GLUCOSE 74 08/03/2015 0230   BUN 12 08/03/2015 0230   CREATININE 0.94 08/03/2015 0230   CALCIUM 8.6* 08/03/2015 0230   PROT 5.9* 08/02/2015 0513   ALBUMIN 3.1* 08/02/2015 0513   AST 21 08/02/2015 0513   ALT 18 08/02/2015 0513   ALKPHOS 42 08/02/2015 0513   BILITOT 0.6 08/02/2015 0513   GFRNONAA 51* 08/03/2015 0230   GFRAA 60* 08/03/2015 0230    GFR Estimated Creatinine Clearance: 38.9 mL/min (by C-G formula based on Cr of 0.94). No results for input(s): LIPASE, AMYLASE in the last 72 hours. No results for input(s): CKTOTAL, CKMB, CKMBINDEX, TROPONINI in the last 72 hours. Invalid input(s): POCBNP No results for input(s): DDIMER in the last 72 hours. No results for input(s): HGBA1C in the last 72 hours.  Recent Labs  08/03/15 0230  CHOL 111  HDL 47  LDLCALC 53  TRIG 54  CHOLHDL 2.4   No results for input(s): TSH, T4TOTAL, T3FREE, THYROIDAB in the last 72 hours.  Invalid input(s): FREET3 No results for input(s): VITAMINB12, FOLATE, FERRITIN, TIBC, IRON, RETICCTPCT in the last 72 hours. Coags:  Recent Labs  08/02/15 0513  INR 1.05    Microbiology: No results found for this or any previous visit (from the past 240 hour(s)).   BRIEF HOSPITAL COURSE:   Principal Problem: Subacute CVA: Patient presented with left-sided weakness which has since resolved. MRI brain showed a possibly new patchy diffusion signal abnormality in the right thalamus which was consistent with a subacute ischemia. Patient recently completed a full stroke workup, was seen by cardiology-and not felt to be a candidate for TEE/loop recorder-as she was not a candidate for anticoagulation and doing it would not change management. Spoke with Dr. city today, he does not advise any further workup, okay to discharge. Patient will be continued on aspirin and Plavix on discharge.  AKI: improved with supportive care. Doubt CKD   Rest of her medical problems were stable during this hospital stay.  TODAY-DAY OF DISCHARGE:  Subjective:   Darlene Murray today has no headache,no chest abdominal pain,no new weakness tingling or numbness, feels much better wants to go home today.   Objective:   Blood pressure 133/45, pulse 79, temperature 97.6 F (36.4 C), temperature source Oral, resp. rate 18, height 5\' 5"  (1.651 m), weight 72.576 kg (160 lb), SpO2 96 %.  Intake/Output Summary (Last 24 hours) at 08/03/15 1108 Last data filed at 08/03/15 0800  Gross per 24 hour  Intake  240 ml  Output      3 ml  Net    237 ml   Filed Weights   08/02/15 0531  Weight: 72.576 kg (160 lb)    Exam Awake Alert, Oriented *3, No new F.N deficits, Normal affect [place].AT,PERRAL Supple Neck,No JVD, No cervical lymphadenopathy appriciated.  Symmetrical Chest wall movement, Good air movement bilaterally, CTAB RRR,No Gallops,Rubs or new Murmurs, No Parasternal Heave +ve B.Sounds, Abd Soft, Non tender, No organomegaly appriciated, No rebound -guarding or rigidity. No Cyanosis, Clubbing or edema, No new Rash or bruise  DISCHARGE CONDITION: Stable  DISPOSITION: Home with home  health services  DISCHARGE INSTRUCTIONS:    Activity:  As tolerated with Full fall precautions use walker/cane & assistance as needed  Get Medicines reviewed and adjusted: Please take all your medications with you for your next visit with your Primary MD  Please request your Primary MD to go over all hospital tests and procedure/radiological results at the follow up, please ask your Primary MD to get all Hospital records sent to his/her office.  If you experience worsening of your admission symptoms, develop shortness of breath, life threatening emergency, suicidal or homicidal thoughts you must seek medical attention immediately by calling 911 or calling your MD immediately  if symptoms less severe.  You must read complete instructions/literature along with all the possible adverse reactions/side effects for all the Medicines you take and that have been prescribed to you. Take any new Medicines after you have completely understood and accpet all the possible adverse reactions/side effects.   Do not drive when taking Pain medications.   Do not take more than prescribed Pain, Sleep and Anxiety Medications  Special Instructions: If you have smoked or chewed Tobacco  in the last 2 yrs please stop smoking, stop any regular Alcohol  and or any Recreational drug use.  Wear Seat belts while driving.  Please note  You were cared for by a hospitalist during your hospital stay. Once you are discharged, your primary care physician will handle any further medical issues. Please note that NO REFILLS for any discharge medications will be authorized once you are discharged, as it is imperative that you return to your primary care physician (or establish a relationship with a primary care physician if you do not have one) for your aftercare needs so that they can reassess your need for medications and monitor your lab values.   Diet recommendation: Heart Healthy diet  Discharge Instructions    Call  MD for:  extreme fatigue    Complete by:  As directed      Call MD for:  persistant dizziness or light-headedness    Complete by:  As directed      Call MD for:  persistant nausea and vomiting    Complete by:  As directed      Diet - low sodium heart healthy    Complete by:  As directed      Increase activity slowly    Complete by:  As directed            Follow-up Information    Follow up with Marcial Pacas, MD On 09/20/2015.   Specialty:  Neurology   Why:  at [place] information:   [address] [place] [address] [phone]       Follow up with Gennette Pac, MD. Schedule an appointment as soon as possible for a visit in 1 week.   Specialty:  Family Medicine  Why:  Hospital follow up   Contact information:   [place] [address] [phone]      Total Time spent on discharge equals 25  minutes.  SignedOren Binet 08/03/2015 11:08 AM

## 2015-08-03 NOTE — Evaluation (Signed)
Occupational Therapy Evaluation Patient Details Name: Darlene Murray MRN: [id number] DOB: [birth year] Today's Date: 08/03/2015    History of Present Illness Pt is a 81 y.o. female hx of prior CVA, HLD presenting with left-sided weakness. PMH: Blindness, Asthma, Stroke (cerebrum)    Clinical Impression   Pt and husband report that the pt required occasional min assist with ADLs and close supervision for functional mobility with use of RW. Currently pt is overall min assist for ADLs and functional mobility. Pt reports she feels close to baseline level but just overall weaker; noted to fatigue quickly with short distance functional mobility in room. Pt planning to d/c home with 24/7 supervision from family. Recommending HHOT for follow up in order to maximize independence and safety with ADLs and functional mobility. Pt would benefit from continued skilled OT to address established goals.     Follow Up Recommendations  Home health OT;Supervision/Assistance - 24 hour    Equipment Recommendations  None recommended by OT    Recommendations for Other Services PT consult     Precautions / Restrictions Precautions Precautions: Fall Restrictions Weight Bearing Restrictions: No      Mobility Bed Mobility Overal bed mobility: Needs Assistance Bed Mobility: Supine to Sit;Sit to Supine     Supine to sit: Min guard;HOB elevated Sit to supine: Min guard   General bed mobility comments: Min guard for safety. Increased time required. HOB elevated for supine to sit with use of rails throughout.  Transfers Overall transfer level: Needs assistance Equipment used: Rolling walker (2 wheeled) Transfers: Sit to/from Stand Sit to Stand: Min assist         General transfer comment: Min assist to boost up from EOB/toilet. Min assist provided for steadying in standing. VCs for hand placement.    Balance Overall balance assessment: Needs assistance Sitting-balance support: Bilateral upper  extremity supported;No upper extremity supported Sitting balance-Leahy Scale: Fair     Standing balance support: No upper extremity supported;During functional activity Standing balance-Leahy Scale: Poor Standing balance comment: Requires min assist for balance in standing with no UE support                            ADL Overall ADL's : Needs assistance/impaired Eating/Feeding: Set up;Sitting   Grooming: Min guard;Standing;Wash/dry hands               Lower Body Dressing: Min guard;Sitting/lateral leans Lower Body Dressing Details (indicate cue type and reason): Pt able to don socks sitting EOB.  Toilet Transfer: Minimal assistance;Ambulation;Regular Toilet;Grab bars;RW   Toileting- Water quality scientist and Hygiene: Min guard;Sitting/lateral lean (for toilet hygiene)       Functional mobility during ADLs: Minimal assistance;Rolling walker General ADL Comments: Pts husband present for OT eval. Very helpful husband who reports providing close sueprvision with mobility and assist with ADLs as needed. Pt reports she feels overall weaker than baseline level but participating in ADLs at same level as baseline. Pts husband feels that she is weaker overall as well and just moving slower.      Vision Vision Assessment?: Vision impaired- to be further tested in functional context   Perception     Praxis      Pertinent Vitals/Pain Pain Assessment: No/denies pain     Hand Dominance Right   Extremity/Trunk Assessment Upper Extremity Assessment Upper Extremity Assessment: RUE deficits/detail RUE Deficits / Details: Decreased shoulder AROM, decreased strength in shoulder. Elbow, wrist, hand ROM and strength WFL. Pt reports shoulder  problems PTA.   Lower Extremity Assessment Lower Extremity Assessment: Defer to PT evaluation   Cervical / Trunk Assessment Cervical / Trunk Assessment: Kyphotic   Communication Communication Communication: No difficulties    Cognition Arousal/Alertness: Awake/alert Behavior During Therapy: WFL for tasks assessed/performed Overall Cognitive Status: History of cognitive impairments - at baseline                     General Comments       Exercises       Shoulder Instructions      Home Living Family/patient expects to be discharged to:: Private residence Living Arrangements: Spouse/significant other Available Help at Discharge: Family;Available 24 hours/day Type of Home: House Home Access: Level entry     Home Layout: One level     Bathroom Shower/Tub: Walk-in Hydrologist: Handicapped height Bathroom Accessibility: Yes How Accessible: Accessible via walker Home Equipment: [place] - 2 wheels;Cane - single point;Grab bars - tub/shower;Shower seat;Bedside commode          Prior Functioning/Environment Level of Independence: Needs assistance  Gait / Transfers Assistance Needed: supervision ambulation with RW household distances ADL's / Homemaking Assistance Needed: husband reports he provides min assist with bathing; "lets her get all the areas she can before he helps"        OT Diagnosis: Generalized weakness;Acute pain   OT Problem List: Decreased strength;Decreased activity tolerance;Impaired balance (sitting and/or standing);Decreased safety awareness;Decreased knowledge of use of DME or AE;Decreased knowledge of precautions   OT Treatment/Interventions: Self-care/ADL training;Therapeutic exercise;Energy conservation;DME and/or AE instruction;Therapeutic activities;Patient/family education    OT Goals(Current goals can be found in the care plan section) Acute Rehab OT Goals Patient Stated Goal: to go home OT Goal Formulation: With patient/family Time For Goal Achievement: 08/17/15 Potential to Achieve Goals: Good ADL Goals Pt Will Perform Grooming: with supervision;standing Pt Will Perform Upper Body Bathing: with supervision;sitting Pt Will Perform Lower  Body Bathing: with supervision;sit to/from stand Pt Will Transfer to Toilet: with supervision;ambulating;bedside commode (over toilet) Pt Will Perform Toileting - Clothing Manipulation and hygiene: with supervision;sit to/from stand  OT Frequency: Min 2X/week   Barriers to D/C:            Co-evaluation              End of Session Equipment Utilized During Treatment: Gait belt;Rolling walker  Activity Tolerance: Patient tolerated treatment well Patient left: in bed;with call bell/phone within reach;with family/visitor present   Time: [id number] OT Time Calculation (min): 18 min Charges:  OT General Charges $OT Visit: 1 Procedure OT Evaluation $OT Eval Moderate Complexity: 1 Procedure G-Codes: OT G-codes **NOT FOR INPATIENT CLASS** Functional Assessment Tool Used: Clinical judgement Functional Limitation: Self care Self Care Current Status [id number]): At least 1 percent but less than 20 percent impaired, limited or restricted Self Care Goal Status [id number]): At least 1 percent but less than 20 percent impaired, limited or restricted   Binnie Kand M.S., OTR/L Pager: [phone]  08/03/2015, 12:35 PM

## 2015-08-04 LAB — HEMOGLOBIN A1C
HEMOGLOBIN A1C: 5.8 % — AB (ref 4.8–5.6)
Mean Plasma Glucose: 120 mg/dL

## 2015-08-06 DIAGNOSIS — N390 Urinary tract infection, site not specified: Secondary | ICD-10-CM | POA: Diagnosis not present

## 2015-08-06 DIAGNOSIS — J45909 Unspecified asthma, uncomplicated: Secondary | ICD-10-CM | POA: Diagnosis not present

## 2015-08-06 DIAGNOSIS — I69351 Hemiplegia and hemiparesis following cerebral infarction affecting right dominant side: Secondary | ICD-10-CM | POA: Diagnosis not present

## 2015-08-06 DIAGNOSIS — E785 Hyperlipidemia, unspecified: Secondary | ICD-10-CM | POA: Diagnosis not present

## 2015-08-06 DIAGNOSIS — B9620 Unspecified Escherichia coli [E. coli] as the cause of diseases classified elsewhere: Secondary | ICD-10-CM | POA: Diagnosis not present

## 2015-08-08 DIAGNOSIS — B9620 Unspecified Escherichia coli [E. coli] as the cause of diseases classified elsewhere: Secondary | ICD-10-CM | POA: Diagnosis not present

## 2015-08-08 DIAGNOSIS — E785 Hyperlipidemia, unspecified: Secondary | ICD-10-CM | POA: Diagnosis not present

## 2015-08-08 DIAGNOSIS — I69351 Hemiplegia and hemiparesis following cerebral infarction affecting right dominant side: Secondary | ICD-10-CM | POA: Diagnosis not present

## 2015-08-08 DIAGNOSIS — J45909 Unspecified asthma, uncomplicated: Secondary | ICD-10-CM | POA: Diagnosis not present

## 2015-08-08 DIAGNOSIS — N390 Urinary tract infection, site not specified: Secondary | ICD-10-CM | POA: Diagnosis not present

## 2015-08-09 DIAGNOSIS — E785 Hyperlipidemia, unspecified: Secondary | ICD-10-CM | POA: Diagnosis not present

## 2015-08-09 DIAGNOSIS — I69351 Hemiplegia and hemiparesis following cerebral infarction affecting right dominant side: Secondary | ICD-10-CM | POA: Diagnosis not present

## 2015-08-09 DIAGNOSIS — B9620 Unspecified Escherichia coli [E. coli] as the cause of diseases classified elsewhere: Secondary | ICD-10-CM | POA: Diagnosis not present

## 2015-08-09 DIAGNOSIS — J45909 Unspecified asthma, uncomplicated: Secondary | ICD-10-CM | POA: Diagnosis not present

## 2015-08-09 DIAGNOSIS — N390 Urinary tract infection, site not specified: Secondary | ICD-10-CM | POA: Diagnosis not present

## 2015-08-10 DIAGNOSIS — I639 Cerebral infarction, unspecified: Secondary | ICD-10-CM | POA: Diagnosis not present

## 2015-08-10 DIAGNOSIS — J45901 Unspecified asthma with (acute) exacerbation: Secondary | ICD-10-CM | POA: Diagnosis not present

## 2015-08-10 DIAGNOSIS — E8809 Other disorders of plasma-protein metabolism, not elsewhere classified: Secondary | ICD-10-CM | POA: Diagnosis not present

## 2015-08-13 DIAGNOSIS — E785 Hyperlipidemia, unspecified: Secondary | ICD-10-CM | POA: Diagnosis not present

## 2015-08-13 DIAGNOSIS — N390 Urinary tract infection, site not specified: Secondary | ICD-10-CM | POA: Diagnosis not present

## 2015-08-13 DIAGNOSIS — B9620 Unspecified Escherichia coli [E. coli] as the cause of diseases classified elsewhere: Secondary | ICD-10-CM | POA: Diagnosis not present

## 2015-08-13 DIAGNOSIS — I69351 Hemiplegia and hemiparesis following cerebral infarction affecting right dominant side: Secondary | ICD-10-CM | POA: Diagnosis not present

## 2015-08-13 DIAGNOSIS — J45909 Unspecified asthma, uncomplicated: Secondary | ICD-10-CM | POA: Diagnosis not present

## 2015-08-15 DIAGNOSIS — N390 Urinary tract infection, site not specified: Secondary | ICD-10-CM | POA: Diagnosis not present

## 2015-08-15 DIAGNOSIS — J45909 Unspecified asthma, uncomplicated: Secondary | ICD-10-CM | POA: Diagnosis not present

## 2015-08-15 DIAGNOSIS — I69351 Hemiplegia and hemiparesis following cerebral infarction affecting right dominant side: Secondary | ICD-10-CM | POA: Diagnosis not present

## 2015-08-15 DIAGNOSIS — B9620 Unspecified Escherichia coli [E. coli] as the cause of diseases classified elsewhere: Secondary | ICD-10-CM | POA: Diagnosis not present

## 2015-08-15 DIAGNOSIS — E785 Hyperlipidemia, unspecified: Secondary | ICD-10-CM | POA: Diagnosis not present

## 2015-08-17 DIAGNOSIS — N390 Urinary tract infection, site not specified: Secondary | ICD-10-CM | POA: Diagnosis not present

## 2015-08-17 DIAGNOSIS — B9620 Unspecified Escherichia coli [E. coli] as the cause of diseases classified elsewhere: Secondary | ICD-10-CM | POA: Diagnosis not present

## 2015-08-17 DIAGNOSIS — I69351 Hemiplegia and hemiparesis following cerebral infarction affecting right dominant side: Secondary | ICD-10-CM | POA: Diagnosis not present

## 2015-08-17 DIAGNOSIS — E785 Hyperlipidemia, unspecified: Secondary | ICD-10-CM | POA: Diagnosis not present

## 2015-08-17 DIAGNOSIS — J45909 Unspecified asthma, uncomplicated: Secondary | ICD-10-CM | POA: Diagnosis not present

## 2015-08-20 ENCOUNTER — Encounter: Payer: Medicare Other | Attending: Physical Medicine & Rehabilitation

## 2015-08-20 ENCOUNTER — Encounter: Payer: Self-pay | Admitting: Physical Medicine & Rehabilitation

## 2015-08-20 ENCOUNTER — Ambulatory Visit (HOSPITAL_BASED_OUTPATIENT_CLINIC_OR_DEPARTMENT_OTHER): Payer: Medicare Other | Admitting: Physical Medicine & Rehabilitation

## 2015-08-20 VITALS — BP 107/62 | HR 71 | Resp 14

## 2015-08-20 DIAGNOSIS — J45909 Unspecified asthma, uncomplicated: Secondary | ICD-10-CM | POA: Diagnosis not present

## 2015-08-20 DIAGNOSIS — I69354 Hemiplegia and hemiparesis following cerebral infarction affecting left non-dominant side: Secondary | ICD-10-CM | POA: Diagnosis not present

## 2015-08-20 DIAGNOSIS — I872 Venous insufficiency (chronic) (peripheral): Secondary | ICD-10-CM | POA: Insufficient documentation

## 2015-08-20 DIAGNOSIS — M129 Arthropathy, unspecified: Secondary | ICD-10-CM

## 2015-08-20 DIAGNOSIS — I517 Cardiomegaly: Secondary | ICD-10-CM | POA: Insufficient documentation

## 2015-08-20 DIAGNOSIS — I69398 Other sequelae of cerebral infarction: Secondary | ICD-10-CM | POA: Diagnosis not present

## 2015-08-20 DIAGNOSIS — R269 Unspecified abnormalities of gait and mobility: Secondary | ICD-10-CM | POA: Insufficient documentation

## 2015-08-20 DIAGNOSIS — R410 Disorientation, unspecified: Secondary | ICD-10-CM | POA: Diagnosis not present

## 2015-08-20 DIAGNOSIS — M19019 Primary osteoarthritis, unspecified shoulder: Secondary | ICD-10-CM | POA: Insufficient documentation

## 2015-08-20 DIAGNOSIS — E785 Hyperlipidemia, unspecified: Secondary | ICD-10-CM | POA: Diagnosis not present

## 2015-08-20 DIAGNOSIS — R270 Ataxia, unspecified: Secondary | ICD-10-CM | POA: Insufficient documentation

## 2015-08-20 DIAGNOSIS — I69351 Hemiplegia and hemiparesis following cerebral infarction affecting right dominant side: Secondary | ICD-10-CM | POA: Diagnosis not present

## 2015-08-20 DIAGNOSIS — M5126 Other intervertebral disc displacement, lumbar region: Secondary | ICD-10-CM | POA: Diagnosis not present

## 2015-08-20 DIAGNOSIS — Z87440 Personal history of urinary (tract) infections: Secondary | ICD-10-CM | POA: Diagnosis not present

## 2015-08-20 DIAGNOSIS — N183 Chronic kidney disease, stage 3 (moderate): Secondary | ICD-10-CM | POA: Diagnosis not present

## 2015-08-20 NOTE — Progress Notes (Signed)
Acromioclavicular joint injection under ultrasound guidance  Indication is for anterior shoulder pain with imaging studies demonstrating acromioclavicular joint arthropathy Pain is only partially responsive to medication management and other conservative care. Pain interferes with ADLs and sleep  The patient was placed in a seated position the acromioclavicular joint was scanned in the long axis view, areas marked prepped with Betadine, sterile technique was utilized. Under direct long axis view 1% lidocaine was infiltrated to the skin and subcutaneous tissues. A 25-gauge 1.5 inch needle reached the acromioclavicular joint space. Then a solution containing 1 mL of lidocaine 1% and 0.5 ML of Celestone 6 mg per mL was injected. Patient tolerated procedure well. Images were saved. Postprocedure instructions given 

## 2015-08-20 NOTE — Patient Instructions (Signed)
We injected the right acromioclavicular joint. It may take a day or 2 to take full effect. I would estimate the duration of benefit is weeks to months.

## 2015-08-21 DIAGNOSIS — N183 Chronic kidney disease, stage 3 (moderate): Secondary | ICD-10-CM | POA: Diagnosis not present

## 2015-08-21 DIAGNOSIS — E785 Hyperlipidemia, unspecified: Secondary | ICD-10-CM | POA: Diagnosis not present

## 2015-08-21 DIAGNOSIS — J45909 Unspecified asthma, uncomplicated: Secondary | ICD-10-CM | POA: Diagnosis not present

## 2015-08-21 DIAGNOSIS — I69354 Hemiplegia and hemiparesis following cerebral infarction affecting left non-dominant side: Secondary | ICD-10-CM | POA: Diagnosis not present

## 2015-08-21 DIAGNOSIS — I69351 Hemiplegia and hemiparesis following cerebral infarction affecting right dominant side: Secondary | ICD-10-CM | POA: Diagnosis not present

## 2015-08-21 DIAGNOSIS — R410 Disorientation, unspecified: Secondary | ICD-10-CM | POA: Diagnosis not present

## 2015-08-22 ENCOUNTER — Other Ambulatory Visit: Payer: Self-pay | Admitting: *Deleted

## 2015-08-22 DIAGNOSIS — R410 Disorientation, unspecified: Secondary | ICD-10-CM | POA: Diagnosis not present

## 2015-08-22 DIAGNOSIS — J45909 Unspecified asthma, uncomplicated: Secondary | ICD-10-CM | POA: Diagnosis not present

## 2015-08-22 DIAGNOSIS — I69351 Hemiplegia and hemiparesis following cerebral infarction affecting right dominant side: Secondary | ICD-10-CM | POA: Diagnosis not present

## 2015-08-22 DIAGNOSIS — N183 Chronic kidney disease, stage 3 (moderate): Secondary | ICD-10-CM | POA: Diagnosis not present

## 2015-08-22 DIAGNOSIS — I69354 Hemiplegia and hemiparesis following cerebral infarction affecting left non-dominant side: Secondary | ICD-10-CM | POA: Diagnosis not present

## 2015-08-22 DIAGNOSIS — E785 Hyperlipidemia, unspecified: Secondary | ICD-10-CM | POA: Diagnosis not present

## 2015-08-22 MED ORDER — MOMETASONE FURO-FORMOTEROL FUM 100-5 MCG/ACT IN AERO: 2.0000 | INHALATION_SPRAY | Freq: Two times a day (BID) | RESPIRATORY_TRACT | Status: DC

## 2015-08-24 DIAGNOSIS — I69351 Hemiplegia and hemiparesis following cerebral infarction affecting right dominant side: Secondary | ICD-10-CM | POA: Diagnosis not present

## 2015-08-24 DIAGNOSIS — R410 Disorientation, unspecified: Secondary | ICD-10-CM | POA: Diagnosis not present

## 2015-08-24 DIAGNOSIS — E785 Hyperlipidemia, unspecified: Secondary | ICD-10-CM | POA: Diagnosis not present

## 2015-08-24 DIAGNOSIS — J45909 Unspecified asthma, uncomplicated: Secondary | ICD-10-CM | POA: Diagnosis not present

## 2015-08-24 DIAGNOSIS — I69354 Hemiplegia and hemiparesis following cerebral infarction affecting left non-dominant side: Secondary | ICD-10-CM | POA: Diagnosis not present

## 2015-08-24 DIAGNOSIS — N183 Chronic kidney disease, stage 3 (moderate): Secondary | ICD-10-CM | POA: Diagnosis not present

## 2015-08-25 DIAGNOSIS — I69351 Hemiplegia and hemiparesis following cerebral infarction affecting right dominant side: Secondary | ICD-10-CM | POA: Diagnosis not present

## 2015-08-25 DIAGNOSIS — J45909 Unspecified asthma, uncomplicated: Secondary | ICD-10-CM | POA: Diagnosis not present

## 2015-08-25 DIAGNOSIS — I69354 Hemiplegia and hemiparesis following cerebral infarction affecting left non-dominant side: Secondary | ICD-10-CM | POA: Diagnosis not present

## 2015-08-25 DIAGNOSIS — E785 Hyperlipidemia, unspecified: Secondary | ICD-10-CM | POA: Diagnosis not present

## 2015-08-25 DIAGNOSIS — R410 Disorientation, unspecified: Secondary | ICD-10-CM | POA: Diagnosis not present

## 2015-08-25 DIAGNOSIS — N183 Chronic kidney disease, stage 3 (moderate): Secondary | ICD-10-CM | POA: Diagnosis not present

## 2015-08-27 DIAGNOSIS — J45909 Unspecified asthma, uncomplicated: Secondary | ICD-10-CM | POA: Diagnosis not present

## 2015-08-27 DIAGNOSIS — I69354 Hemiplegia and hemiparesis following cerebral infarction affecting left non-dominant side: Secondary | ICD-10-CM | POA: Diagnosis not present

## 2015-08-27 DIAGNOSIS — I69351 Hemiplegia and hemiparesis following cerebral infarction affecting right dominant side: Secondary | ICD-10-CM | POA: Diagnosis not present

## 2015-08-27 DIAGNOSIS — N183 Chronic kidney disease, stage 3 (moderate): Secondary | ICD-10-CM | POA: Diagnosis not present

## 2015-08-27 DIAGNOSIS — E785 Hyperlipidemia, unspecified: Secondary | ICD-10-CM | POA: Diagnosis not present

## 2015-08-27 DIAGNOSIS — R410 Disorientation, unspecified: Secondary | ICD-10-CM | POA: Diagnosis not present

## 2015-08-29 DIAGNOSIS — I69351 Hemiplegia and hemiparesis following cerebral infarction affecting right dominant side: Secondary | ICD-10-CM | POA: Diagnosis not present

## 2015-08-29 DIAGNOSIS — J45909 Unspecified asthma, uncomplicated: Secondary | ICD-10-CM | POA: Diagnosis not present

## 2015-08-29 DIAGNOSIS — E785 Hyperlipidemia, unspecified: Secondary | ICD-10-CM | POA: Diagnosis not present

## 2015-08-29 DIAGNOSIS — N183 Chronic kidney disease, stage 3 (moderate): Secondary | ICD-10-CM | POA: Diagnosis not present

## 2015-08-29 DIAGNOSIS — I69354 Hemiplegia and hemiparesis following cerebral infarction affecting left non-dominant side: Secondary | ICD-10-CM | POA: Diagnosis not present

## 2015-08-29 DIAGNOSIS — R410 Disorientation, unspecified: Secondary | ICD-10-CM | POA: Diagnosis not present

## 2015-08-30 DIAGNOSIS — I69351 Hemiplegia and hemiparesis following cerebral infarction affecting right dominant side: Secondary | ICD-10-CM | POA: Diagnosis not present

## 2015-08-30 DIAGNOSIS — R410 Disorientation, unspecified: Secondary | ICD-10-CM | POA: Diagnosis not present

## 2015-08-30 DIAGNOSIS — I69354 Hemiplegia and hemiparesis following cerebral infarction affecting left non-dominant side: Secondary | ICD-10-CM | POA: Diagnosis not present

## 2015-08-30 DIAGNOSIS — J45909 Unspecified asthma, uncomplicated: Secondary | ICD-10-CM | POA: Diagnosis not present

## 2015-08-30 DIAGNOSIS — E785 Hyperlipidemia, unspecified: Secondary | ICD-10-CM | POA: Diagnosis not present

## 2015-08-30 DIAGNOSIS — N183 Chronic kidney disease, stage 3 (moderate): Secondary | ICD-10-CM | POA: Diagnosis not present

## 2015-08-31 DIAGNOSIS — I69351 Hemiplegia and hemiparesis following cerebral infarction affecting right dominant side: Secondary | ICD-10-CM | POA: Diagnosis not present

## 2015-08-31 DIAGNOSIS — I69354 Hemiplegia and hemiparesis following cerebral infarction affecting left non-dominant side: Secondary | ICD-10-CM | POA: Diagnosis not present

## 2015-08-31 DIAGNOSIS — E785 Hyperlipidemia, unspecified: Secondary | ICD-10-CM | POA: Diagnosis not present

## 2015-08-31 DIAGNOSIS — N183 Chronic kidney disease, stage 3 (moderate): Secondary | ICD-10-CM | POA: Diagnosis not present

## 2015-08-31 DIAGNOSIS — R410 Disorientation, unspecified: Secondary | ICD-10-CM | POA: Diagnosis not present

## 2015-08-31 DIAGNOSIS — J45909 Unspecified asthma, uncomplicated: Secondary | ICD-10-CM | POA: Diagnosis not present

## 2015-09-03 DIAGNOSIS — I69351 Hemiplegia and hemiparesis following cerebral infarction affecting right dominant side: Secondary | ICD-10-CM | POA: Diagnosis not present

## 2015-09-03 DIAGNOSIS — J45909 Unspecified asthma, uncomplicated: Secondary | ICD-10-CM | POA: Diagnosis not present

## 2015-09-03 DIAGNOSIS — E785 Hyperlipidemia, unspecified: Secondary | ICD-10-CM | POA: Diagnosis not present

## 2015-09-03 DIAGNOSIS — I69354 Hemiplegia and hemiparesis following cerebral infarction affecting left non-dominant side: Secondary | ICD-10-CM | POA: Diagnosis not present

## 2015-09-03 DIAGNOSIS — N183 Chronic kidney disease, stage 3 (moderate): Secondary | ICD-10-CM | POA: Diagnosis not present

## 2015-09-03 DIAGNOSIS — R410 Disorientation, unspecified: Secondary | ICD-10-CM | POA: Diagnosis not present

## 2015-09-04 DIAGNOSIS — I69354 Hemiplegia and hemiparesis following cerebral infarction affecting left non-dominant side: Secondary | ICD-10-CM | POA: Diagnosis not present

## 2015-09-04 DIAGNOSIS — R410 Disorientation, unspecified: Secondary | ICD-10-CM | POA: Diagnosis not present

## 2015-09-04 DIAGNOSIS — I69351 Hemiplegia and hemiparesis following cerebral infarction affecting right dominant side: Secondary | ICD-10-CM | POA: Diagnosis not present

## 2015-09-04 DIAGNOSIS — E785 Hyperlipidemia, unspecified: Secondary | ICD-10-CM | POA: Diagnosis not present

## 2015-09-04 DIAGNOSIS — J45909 Unspecified asthma, uncomplicated: Secondary | ICD-10-CM | POA: Diagnosis not present

## 2015-09-04 DIAGNOSIS — N183 Chronic kidney disease, stage 3 (moderate): Secondary | ICD-10-CM | POA: Diagnosis not present

## 2015-09-05 DIAGNOSIS — J45909 Unspecified asthma, uncomplicated: Secondary | ICD-10-CM | POA: Diagnosis not present

## 2015-09-05 DIAGNOSIS — R410 Disorientation, unspecified: Secondary | ICD-10-CM | POA: Diagnosis not present

## 2015-09-05 DIAGNOSIS — I69354 Hemiplegia and hemiparesis following cerebral infarction affecting left non-dominant side: Secondary | ICD-10-CM | POA: Diagnosis not present

## 2015-09-05 DIAGNOSIS — N183 Chronic kidney disease, stage 3 (moderate): Secondary | ICD-10-CM | POA: Diagnosis not present

## 2015-09-05 DIAGNOSIS — I69351 Hemiplegia and hemiparesis following cerebral infarction affecting right dominant side: Secondary | ICD-10-CM | POA: Diagnosis not present

## 2015-09-05 DIAGNOSIS — E785 Hyperlipidemia, unspecified: Secondary | ICD-10-CM | POA: Diagnosis not present

## 2015-09-06 DIAGNOSIS — J45909 Unspecified asthma, uncomplicated: Secondary | ICD-10-CM | POA: Diagnosis not present

## 2015-09-06 DIAGNOSIS — N183 Chronic kidney disease, stage 3 (moderate): Secondary | ICD-10-CM | POA: Diagnosis not present

## 2015-09-06 DIAGNOSIS — I69351 Hemiplegia and hemiparesis following cerebral infarction affecting right dominant side: Secondary | ICD-10-CM | POA: Diagnosis not present

## 2015-09-06 DIAGNOSIS — E785 Hyperlipidemia, unspecified: Secondary | ICD-10-CM | POA: Diagnosis not present

## 2015-09-06 DIAGNOSIS — R410 Disorientation, unspecified: Secondary | ICD-10-CM | POA: Diagnosis not present

## 2015-09-06 DIAGNOSIS — I69354 Hemiplegia and hemiparesis following cerebral infarction affecting left non-dominant side: Secondary | ICD-10-CM | POA: Diagnosis not present

## 2015-09-10 DIAGNOSIS — I69354 Hemiplegia and hemiparesis following cerebral infarction affecting left non-dominant side: Secondary | ICD-10-CM | POA: Diagnosis not present

## 2015-09-10 DIAGNOSIS — J45909 Unspecified asthma, uncomplicated: Secondary | ICD-10-CM | POA: Diagnosis not present

## 2015-09-10 DIAGNOSIS — E785 Hyperlipidemia, unspecified: Secondary | ICD-10-CM | POA: Diagnosis not present

## 2015-09-10 DIAGNOSIS — N183 Chronic kidney disease, stage 3 (moderate): Secondary | ICD-10-CM | POA: Diagnosis not present

## 2015-09-10 DIAGNOSIS — I69351 Hemiplegia and hemiparesis following cerebral infarction affecting right dominant side: Secondary | ICD-10-CM | POA: Diagnosis not present

## 2015-09-10 DIAGNOSIS — R410 Disorientation, unspecified: Secondary | ICD-10-CM | POA: Diagnosis not present

## 2015-09-11 ENCOUNTER — Observation Stay (HOSPITAL_COMMUNITY): Payer: Medicare Other

## 2015-09-11 ENCOUNTER — Emergency Department (HOSPITAL_COMMUNITY): Payer: Medicare Other

## 2015-09-11 ENCOUNTER — Encounter (HOSPITAL_COMMUNITY): Payer: Self-pay | Admitting: Emergency Medicine

## 2015-09-11 ENCOUNTER — Inpatient Hospital Stay (HOSPITAL_COMMUNITY)
Admission: EM | Admit: 2015-09-11 | Discharge: 2015-09-13 | DRG: 065 | Disposition: A | Discharge status: Rehabilitation Facility | Payer: Medicare Other | Attending: Internal Medicine | Admitting: Internal Medicine

## 2015-09-11 DIAGNOSIS — H548 Legal blindness, as defined in USA: Secondary | ICD-10-CM | POA: Diagnosis present

## 2015-09-11 DIAGNOSIS — I6930 Unspecified sequelae of cerebral infarction: Secondary | ICD-10-CM | POA: Diagnosis present

## 2015-09-11 DIAGNOSIS — J4550 Severe persistent asthma, uncomplicated: Secondary | ICD-10-CM | POA: Diagnosis not present

## 2015-09-11 DIAGNOSIS — I639 Cerebral infarction, unspecified: Secondary | ICD-10-CM | POA: Diagnosis not present

## 2015-09-11 DIAGNOSIS — M6281 Muscle weakness (generalized): Secondary | ICD-10-CM | POA: Diagnosis not present

## 2015-09-11 DIAGNOSIS — Z7902 Long term (current) use of antithrombotics/antiplatelets: Secondary | ICD-10-CM | POA: Diagnosis not present

## 2015-09-11 DIAGNOSIS — I6789 Other cerebrovascular disease: Secondary | ICD-10-CM | POA: Diagnosis not present

## 2015-09-11 DIAGNOSIS — I10 Essential (primary) hypertension: Secondary | ICD-10-CM | POA: Diagnosis present

## 2015-09-11 DIAGNOSIS — I6381 Other cerebral infarction due to occlusion or stenosis of small artery: Secondary | ICD-10-CM | POA: Diagnosis present

## 2015-09-11 DIAGNOSIS — J4540 Moderate persistent asthma, uncomplicated: Secondary | ICD-10-CM | POA: Diagnosis present

## 2015-09-11 DIAGNOSIS — R944 Abnormal results of kidney function studies: Secondary | ICD-10-CM | POA: Diagnosis not present

## 2015-09-11 DIAGNOSIS — I69351 Hemiplegia and hemiparesis following cerebral infarction affecting right dominant side: Secondary | ICD-10-CM

## 2015-09-11 DIAGNOSIS — I672 Cerebral atherosclerosis: Secondary | ICD-10-CM | POA: Diagnosis present

## 2015-09-11 DIAGNOSIS — Z7982 Long term (current) use of aspirin: Secondary | ICD-10-CM | POA: Diagnosis not present

## 2015-09-11 DIAGNOSIS — I63233 Cerebral infarction due to unspecified occlusion or stenosis of bilateral carotid arteries: Secondary | ICD-10-CM | POA: Diagnosis not present

## 2015-09-11 DIAGNOSIS — Z7951 Long term (current) use of inhaled steroids: Secondary | ICD-10-CM | POA: Diagnosis not present

## 2015-09-11 DIAGNOSIS — I739 Peripheral vascular disease, unspecified: Secondary | ICD-10-CM | POA: Diagnosis present

## 2015-09-11 DIAGNOSIS — M5126 Other intervertebral disc displacement, lumbar region: Secondary | ICD-10-CM | POA: Diagnosis present

## 2015-09-11 DIAGNOSIS — I6320 Cerebral infarction due to unspecified occlusion or stenosis of unspecified precerebral arteries: Secondary | ICD-10-CM | POA: Diagnosis not present

## 2015-09-11 DIAGNOSIS — R531 Weakness: Secondary | ICD-10-CM

## 2015-09-11 DIAGNOSIS — G8194 Hemiplegia, unspecified affecting left nondominant side: Secondary | ICD-10-CM | POA: Diagnosis not present

## 2015-09-11 DIAGNOSIS — Z7952 Long term (current) use of systemic steroids: Secondary | ICD-10-CM

## 2015-09-11 DIAGNOSIS — M6289 Other specified disorders of muscle: Secondary | ICD-10-CM | POA: Diagnosis not present

## 2015-09-11 DIAGNOSIS — I63511 Cerebral infarction due to unspecified occlusion or stenosis of right middle cerebral artery: Secondary | ICD-10-CM | POA: Diagnosis present

## 2015-09-11 DIAGNOSIS — N6019 Diffuse cystic mastopathy of unspecified breast: Secondary | ICD-10-CM | POA: Diagnosis present

## 2015-09-11 DIAGNOSIS — Z79899 Other long term (current) drug therapy: Secondary | ICD-10-CM

## 2015-09-11 DIAGNOSIS — I6350 Cerebral infarction due to unspecified occlusion or stenosis of unspecified cerebral artery: Secondary | ICD-10-CM | POA: Diagnosis not present

## 2015-09-11 DIAGNOSIS — R29810 Facial weakness: Secondary | ICD-10-CM | POA: Diagnosis not present

## 2015-09-11 DIAGNOSIS — E785 Hyperlipidemia, unspecified: Secondary | ICD-10-CM | POA: Diagnosis not present

## 2015-09-11 DIAGNOSIS — I69398 Other sequelae of cerebral infarction: Secondary | ICD-10-CM | POA: Diagnosis not present

## 2015-09-11 DIAGNOSIS — R4781 Slurred speech: Secondary | ICD-10-CM | POA: Diagnosis not present

## 2015-09-11 DIAGNOSIS — R269 Unspecified abnormalities of gait and mobility: Secondary | ICD-10-CM | POA: Diagnosis not present

## 2015-09-11 LAB — CBC
HCT: 41.2 % (ref 36.0–46.0)
HEMOGLOBIN: 13.3 g/dL (ref 12.0–15.0)
MCH: 28.5 pg (ref 26.0–34.0)
MCHC: 32.3 g/dL (ref 30.0–36.0)
MCV: 88.4 fL (ref 78.0–100.0)
Platelets: 227 10*3/uL (ref 150–400)
RBC: 4.66 MIL/uL (ref 3.87–5.11)
RDW: 16.1 % — AB (ref 11.5–15.5)
WBC: 7.7 10*3/uL (ref 4.0–10.5)

## 2015-09-11 LAB — RAPID URINE DRUG SCREEN, HOSP PERFORMED
Amphetamines: NOT DETECTED
BARBITURATES: NOT DETECTED
BENZODIAZEPINES: NOT DETECTED
COCAINE: NOT DETECTED
Opiates: NOT DETECTED
TETRAHYDROCANNABINOL: NOT DETECTED

## 2015-09-11 LAB — I-STAT CHEM 8, ED
BUN: 21 mg/dL — ABNORMAL HIGH (ref 6–20)
CHLORIDE: 104 mmol/L (ref 101–111)
CREATININE: 1 mg/dL (ref 0.44–1.00)
Calcium, Ion: 1.11 mmol/L — ABNORMAL LOW (ref 1.13–1.30)
Glucose, Bld: 75 mg/dL (ref 65–99)
HEMATOCRIT: 42 % (ref 36.0–46.0)
HEMOGLOBIN: 14.3 g/dL (ref 12.0–15.0)
POTASSIUM: 4.1 mmol/L (ref 3.5–5.1)
Sodium: 143 mmol/L (ref 135–145)
TCO2: 27 mmol/L (ref 0–100)

## 2015-09-11 LAB — URINALYSIS, ROUTINE W REFLEX MICROSCOPIC
Bilirubin Urine: NEGATIVE
GLUCOSE, UA: NEGATIVE mg/dL
HGB URINE DIPSTICK: NEGATIVE
KETONES UR: NEGATIVE mg/dL
Nitrite: NEGATIVE
PROTEIN: NEGATIVE mg/dL
Specific Gravity, Urine: 1.017 (ref 1.005–1.030)
pH: 7 (ref 5.0–8.0)

## 2015-09-11 LAB — COMPREHENSIVE METABOLIC PANEL
ALBUMIN: 3 g/dL — AB (ref 3.5–5.0)
ALT: 17 U/L (ref 14–54)
AST: 20 U/L (ref 15–41)
Alkaline Phosphatase: 43 U/L (ref 38–126)
Anion gap: 8 (ref 5–15)
BUN: 16 mg/dL (ref 6–20)
CHLORIDE: 108 mmol/L (ref 101–111)
CO2: 27 mmol/L (ref 22–32)
Calcium: 8.8 mg/dL — ABNORMAL LOW (ref 8.9–10.3)
Creatinine, Ser: 1.11 mg/dL — ABNORMAL HIGH (ref 0.44–1.00)
GFR calc Af Amer: 49 mL/min — ABNORMAL LOW (ref >60)
GFR calc non Af Amer: 42 mL/min — ABNORMAL LOW (ref >60)
GLUCOSE: 80 mg/dL (ref 65–99)
POTASSIUM: 4.1 mmol/L (ref 3.5–5.1)
Sodium: 143 mmol/L (ref 135–145)
Total Bilirubin: 0.7 mg/dL (ref 0.3–1.2)
Total Protein: 5.8 g/dL — ABNORMAL LOW (ref 6.5–8.1)

## 2015-09-11 LAB — I-STAT TROPONIN, ED: Troponin i, poc: 0 ng/mL (ref 0.00–0.08)

## 2015-09-11 LAB — DIFFERENTIAL
BASOS ABS: 0 10*3/uL (ref 0.0–0.1)
BASOS PCT: 0 %
EOS ABS: 0.4 10*3/uL (ref 0.0–0.7)
Eosinophils Relative: 5 %
Lymphocytes Relative: 42 %
Lymphs Abs: 3.3 10*3/uL (ref 0.7–4.0)
Monocytes Absolute: 0.6 10*3/uL (ref 0.1–1.0)
Monocytes Relative: 8 %
NEUTROS PCT: 44 %
Neutro Abs: 3.4 10*3/uL (ref 1.7–7.7)

## 2015-09-11 LAB — PROTIME-INR
INR: 1.07 (ref 0.00–1.49)
Prothrombin Time: 14.1 seconds (ref 11.6–15.2)

## 2015-09-11 LAB — URINE MICROSCOPIC-ADD ON

## 2015-09-11 LAB — CBG MONITORING, ED: Glucose-Capillary: 78 mg/dL (ref 65–99)

## 2015-09-11 LAB — APTT: APTT: 31 s (ref 24–37)

## 2015-09-11 LAB — ETHANOL

## 2015-09-11 MED ORDER — ENOXAPARIN SODIUM 40 MG/0.4ML ~~LOC~~ SOLN
40.0000 mg | Freq: Every day | SUBCUTANEOUS | Status: DC
  Administered 2015-09-11 – 2015-09-13 (×3): 40 mg via SUBCUTANEOUS
  Filled 2015-09-11 (×3): qty 0.4

## 2015-09-11 MED ORDER — ONDANSETRON HCL 4 MG/2ML IJ SOLN: 4.0000 mg | Freq: Four times a day (QID) | INTRAMUSCULAR | Status: DC | PRN

## 2015-09-11 MED ORDER — ASPIRIN 325 MG PO TABS
325.0000 mg | ORAL_TABLET | Freq: Every day | ORAL | Status: DC
  Administered 2015-09-12 – 2015-09-13 (×2): 325 mg via ORAL
  Filled 2015-09-11 (×2): qty 1

## 2015-09-11 MED ORDER — ONDANSETRON HCL 4 MG PO TABS: 4.0000 mg | ORAL_TABLET | Freq: Four times a day (QID) | ORAL | Status: DC | PRN

## 2015-09-11 MED ORDER — MOMETASONE FURO-FORMOTEROL FUM 100-5 MCG/ACT IN AERO
2.0000 | INHALATION_SPRAY | Freq: Two times a day (BID) | RESPIRATORY_TRACT | Status: DC
  Administered 2015-09-11 – 2015-09-13 (×4): 2 via RESPIRATORY_TRACT
  Filled 2015-09-11: qty 8.8

## 2015-09-11 MED ORDER — GADOBENATE DIMEGLUMINE 529 MG/ML IV SOLN
15.0000 mL | Freq: Once | INTRAVENOUS | Status: AC
  Administered 2015-09-11: 15 mL via INTRAVENOUS

## 2015-09-11 MED ORDER — CLOPIDOGREL BISULFATE 75 MG PO TABS
75.0000 mg | ORAL_TABLET | Freq: Every day | ORAL | Status: DC
  Administered 2015-09-12 – 2015-09-13 (×2): 75 mg via ORAL
  Filled 2015-09-11 (×2): qty 1

## 2015-09-11 MED ORDER — STROKE: EARLY STAGES OF RECOVERY BOOK
Freq: Once | Status: DC
  Filled 2015-09-11: qty 1

## 2015-09-11 MED ORDER — PRAVASTATIN SODIUM 20 MG PO TABS
20.0000 mg | ORAL_TABLET | Freq: Every day | ORAL | Status: DC
  Administered 2015-09-11 – 2015-09-12 (×2): 20 mg via ORAL
  Filled 2015-09-11 (×2): qty 1

## 2015-09-11 MED ORDER — ASPIRIN 300 MG RE SUPP: 300.0000 mg | Freq: Every day | RECTAL | Status: DC

## 2015-09-11 MED ORDER — ACETAMINOPHEN 650 MG RE SUPP: 650.0000 mg | Freq: Four times a day (QID) | RECTAL | Status: DC | PRN

## 2015-09-11 MED ORDER — ACETAMINOPHEN 325 MG PO TABS: 650.0000 mg | ORAL_TABLET | Freq: Four times a day (QID) | ORAL | Status: DC | PRN

## 2015-09-11 MED ORDER — PREDNISONE 5 MG PO TABS
5.0000 mg | ORAL_TABLET | Freq: Every day | ORAL | Status: DC
  Administered 2015-09-11 – 2015-09-13 (×3): 5 mg via ORAL
  Filled 2015-09-11 (×3): qty 1

## 2015-09-11 NOTE — H&P (Signed)
[facility] History and Physical  Darlene Murray [id number] DOB: February 06, 1922 DOA: 09/11/2015  Referring physician: Dr Claudine Mouton - [facility] PCP: Gennette Pac, MD   Chief Complaint: L sided weakness  HPI: Darlene Murray is a 82 y.o. female  L sided weakness. Was unable to move her left side. Of note patient also will not open her eyes family brought patient to the emergency room. Of note patient has had previous strokes which left her with right-sided weakness. Currently patient's left sided weakness is somewhat improved but now she has left facial droop and slurred speech which is worse. Patient is not acting herself per family. Denies any recent changes in behavior, complaints such as chest pain, fevers, nausea, vomiting, headache, neck stiffness, shortness of breath, or medication changes. Unable to obtain further history review of systems due to patient's mental state and being non-participatory. History obtained by ED P report, patient's husband, and patient's son.   Review of Systems:  Per HPI. Unable to obtain further review systems due to patient's current mental state and difficulty with speech  Past Medical History  Diagnosis Date  . Blindness   . Asthmatic bronchitis   . Asthma   . Fibrocystic breast disease   . Allergic rhinitis   . Hyperlipidemia   . Venous insufficiency     lower extremities  . HNP (herniated nucleus pulposus), lumbar     L4-L5  . Left ventricular hypertrophy 2008    mild  . Antibiotic-associated diarrhea   . Stroke (cerebrum) [facility])    Past Surgical History  Procedure Laterality Date  . Cataract extraction    . Colonoscopy     Social History:  reports that she has been passively smoking.  She has never used smokeless tobacco. She reports that she does not drink alcohol or use illicit drugs.  No Known Allergies  Family History  Problem Relation Age of Onset  . Heart attack Sister   . Alzheimer's disease Sister   . Asthma Son      Prior to  Admission medications   Medication Sig Start Date End Date Taking? Authorizing Provider  albuterol (PROVENTIL HFA;VENTOLIN HFA) 108 (90 Base) MCG/ACT inhaler Inhale 2 puffs into the lungs every 6 (six) hours as needed for wheezing or shortness of breath. 07/30/15  Yes Rigoberto Noel, MD  aspirin 81 MG tablet Take 81 mg by mouth daily.   Yes Historical Provider, MD  calcium carbonate (OS-CAL) 600 MG TABS Take 600 mg by mouth daily.     Yes Historical Provider, MD  cholecalciferol (VITAMIN D) 1000 UNITS tablet Take 1,000 Units by mouth daily.   Yes Historical Provider, MD  clopidogrel (PLAVIX) 75 MG tablet Take 1 tablet (75 mg total) by mouth daily. 06/13/15  Yes Marcial Pacas, MD  mometasone-formoterol (DULERA) 100-5 MCG/ACT AERO Inhale 2 puffs into the lungs 2 (two) times daily. 08/22/15  Yes Rigoberto Noel, MD  Multiple Vitamin (MULTIVITAMIN) tablet Take 1 tablet by mouth daily.   Yes Historical Provider, MD  pravastatin (PRAVACHOL) 20 MG tablet Take 1 tablet (20 mg total) by mouth daily at 6 PM. 05/23/15  Yes Marcial Pacas, MD  predniSONE (DELTASONE) 5 MG tablet Take 1 tablet (5 mg total) by mouth daily. 04/20/15  Yes Lavon Paganini Angiulli, PA-C   Physical Exam: Filed Vitals:   09/11/15 0545 09/11/15 0547 09/11/15 0600 09/11/15 0743  BP: 135/65 135/65 132/82 132/82  Pulse: 62 60 59 62  Temp:  97.9 F (36.6 C)    TempSrc:  Oral    Resp: 22 18 15 15   SpO2: 99% 99% 99% 100%    Wt Readings from Last 3 Encounters:  08/02/15 72.576 kg (160 lb)  06/27/15 73.71 kg (162 lb 8 oz)  06/12/15 76.204 kg (168 lb)    General:  Appears calm and comfortable ENT:  grossly normal hearing, lips & tongue Neck:  no LAD, masses or thyromegaly Cardiovascular:  RRR, no m/r/g. No LE edema.  Respiratory:  CTA bilaterally, no w/r/r. Normal respiratory effort. Abdomen:  soft, ntnd Skin:  no rash or induration seen on limited exam Musculoskeletal: Left upper lower extremity weakness as noted below. No  effusions. Psychiatric: Slurred speech but answers questions appropriately. Patient sleepy appearing. Alert and oriented 3. Neurologic: Left-sided facial droop, left grip strength 2/5, right grip strength 5/5, left arm flexion and left leg flexion 2/5.           Labs on Admission:  Basic Metabolic Panel:  Recent Labs Lab 09/11/15 0544 09/11/15 0552  NA 143 143  K 4.1 4.1  CL 108 104  CO2 27  --   GLUCOSE 80 75  BUN 16 21*  CREATININE 1.11* 1.00  CALCIUM 8.8*  --    Liver Function Tests:  Recent Labs Lab 09/11/15 0544  AST 20  ALT 17  ALKPHOS 43  BILITOT 0.7  PROT 5.8*  ALBUMIN 3.0*   No results for input(s): LIPASE, AMYLASE in the last 168 hours. No results for input(s): AMMONIA in the last 168 hours. CBC:  Recent Labs Lab 09/11/15 0544 09/11/15 0552  WBC 7.7  --   NEUTROABS 3.4  --   HGB 13.3 14.3  HCT 41.2 42.0  MCV 88.4  --   PLT 227  --    Cardiac Enzymes: No results for input(s): CKTOTAL, CKMB, CKMBINDEX, TROPONINI in the last 168 hours.  BNP (last 3 results)  Recent Labs  12/16/14 1025  BNP 14.1    ProBNP (last 3 results) No results for input(s): PROBNP in the last 8760 hours.   Creatinine clearance cannot be calculated (Unknown ideal weight.)  CBG:  Recent Labs Lab 09/11/15 0544  GLUCAP 78    Radiological Exams on Admission: Ct Head Code Stroke W/o Cm  09/11/2015  CLINICAL DATA:  82 year old female with slurred speech, left-sided weakness and left facial droop. History stroke. Initial encounter. EXAM: CT HEAD WITHOUT CONTRAST TECHNIQUE: Contiguous axial images were obtained from the base of the skull through the vertex without intravenous contrast. COMPARISON:  08/02/2015 brain MR and head CT. FINDINGS: No intracranial hemorrhage. Several prior remote infarcts with appearance of evolution/progression of right thalamic and posterior limb right internal capsule infarct. Moderate small vessel disease changes. Global atrophy without  hydrocephalus. No intracranial mass lesion noted on this unenhanced exam. Exophthalmos. Opacification right sphenoid sinus. Mild mucosal thickening maxillary sinuses and ethmoid sinus air cells greater on the left. IMPRESSION: No intracranial hemorrhage. Several prior remote infarcts with appearance of evolution/progression of right thalamic and posterior limb right internal capsule infarct. Moderate small vessel disease changes. Global atrophy without hydrocephalus. Opacification right sphenoid sinus. Mild mucosal thickening maxillary sinuses and ethmoid sinus air cells greater on the left. These results were called by telephone at the time of interpretation on 09/11/2015 at 6:39 am to Dr. Everlene Balls , who verbally acknowledged these results. Electronically Signed   By: Genia Del M.D.   On: 09/11/2015 06:41   Assessment/Plan Active Problems:   Severe persistent asthma in adult steroid dependent   Hyperlipidemia  CVA (cerebral infarction)   Left-sided weakness   Stroke: pt w/ 3 strokes in recent past. Currently w/ new deficits (LUE, LLE and L facial weakness). Previous strokes left pt w/ R sided weakenss. CT showing multiple previous strokes but no acute stroke. Neurology consulted and following, recommending medicine admit and stroke workup. Patient is not a candidate for any quite relation due to fall and bleed risks. Currently patient is not a TPA candidate. - Telemetry - In patient, patient likely will need placement after current stroke workup - PT/OT, speech - Patient to remain in bed with head of bed 0-30  -  MRA head and neck, MRI brain  - Echo  - Neuro checks  - Risk stratification labs  - CSW for placement  - continue ASA, Plavix  HLD: - continue statin  Asthma: no acute exacerbation - continue prednisone and Dulera - albuterol PRN    Code Status: FULL  DVT Prophylaxis: Lovenox Family Communication: son and husband Disposition Plan: Pending Improvement    Cathern Tahir,  Nilsa Macht Lenna Sciara, MD Family Medicine [facility] [email or web address] Password [email or web address]

## 2015-09-11 NOTE — ED Notes (Signed)
Pt presents to ER with [facility] for stroke like symptoms that were discovered at 0400 this morning; LSN 1900 last night; husband reports patient woke up this morning with slurred speech LEFT sided weakness and LEFT sided facial droop; EMS reports on scene symptoms resolved momentarily then resumed enroute to ER

## 2015-09-11 NOTE — Progress Notes (Signed)
Pt arrived to 5C16 @ 1010 from ED, Pt A&Ox 4, c/o pain 0/10. Pt VS taken,. Family at the bedside. Diet ordered, will monitor.

## 2015-09-11 NOTE — Care Management Note (Signed)
Case Management Note  Patient Details  Name: Darlene Murray MRN: [id number] Date of Birth: Mar 18, 1922  Subjective/Objective:                    Action/Plan: Patient was admitted with left-sided weakness. Will follow for discharge needs pending PT/OT evals and physician orders.  Expected Discharge Date:                  Expected Discharge Plan:     In-House Referral:     Discharge planning Services     Post Acute Care Choice:    Choice offered to:     DME Arranged:    DME Agency:     HH Arranged:    HH Agency:     Status of Service:  In process, will continue to follow  Medicare Important Message Given:    Date Medicare IM Given:    Medicare IM give by:    Date Additional Medicare IM Given:    Additional Medicare Important Message give by:     If discussed at [place] of Stay Meetings, dates discussed:    Additional CommentsRolm Baptise, RN 09/11/2015, 11:13 AM [phone]

## 2015-09-11 NOTE — ED Notes (Signed)
Pt awake and alert but will not open her eyes

## 2015-09-11 NOTE — Consult Note (Signed)
Neurology Consultation Reason for Consult: Left sided weakness Referring Physician: Claudine Mouton, A  CC: Stroke  History is obtained from:patient  HPI: Darlene Murray is a 82 y.o. female with a history of multiple recent strokes who presents with new onset left sided weakness that started over night. Her husband states that she awoke around 4 AM, and at that time was having difficulty with her left side and was not opening her eyes. Of note, she has had multiple strokes in the past few months. There is no embolic appearance to the strokes, however anticoagulation was not felt to be appropriate given her fall risk and therefore valuation with long-term Holter monitor or TEE have not been undertaken.  She is able at baseline to walk a little ways with a walker, usually with help.    LKW: 7 pm tpa given?: no, out of window, stroke within 3 months Premorbid modified rankin scale: 3     ROS:  Unable to obtain due to altered mental status.   Past Medical History  Diagnosis Date  . Blindness   . Asthmatic bronchitis   . Asthma   . Fibrocystic breast disease   . Allergic rhinitis   . Hyperlipidemia   . Venous insufficiency     lower extremities  . HNP (herniated nucleus pulposus), lumbar     L4-L5  . Left ventricular hypertrophy 2008    mild  . Antibiotic-associated diarrhea   . Stroke (cerebrum) [facility])      Family History  Problem Relation Age of Onset  . Heart attack Sister   . Alzheimer's disease Sister   . Asthma Son      Social History:  reports that she has been passively smoking.  She has never used smokeless tobacco. She reports that she does not drink alcohol or use illicit drugs.   Exam: Current vital signs: BP 132/82 mmHg  Pulse 59  Temp(Src) 97.9 F (36.6 C) (Oral)  Resp 15  SpO2 99% Vital signs in last 24 hours: Temp:  [97.9 F (36.6 C)] 97.9 F (36.6 C) (03/07 0547) Pulse Rate:  [59-62] 59 (03/07 0600) Resp:  [15-22] 15 (03/07 0600) BP: (132-135)/(65-82)  132/82 mmHg (03/07 0600) SpO2:  [99 %] 99 % (03/07 0600)   Physical Exam  Constitutional: Appears elderly Psych: Affect appropriate to situation Eyes: No scleral injection HENT: No OP obstrucion Head: Normocephalic.  Cardiovascular: Normal rate and regular rhythm.  Respiratory: Effort normal and breath sounds normal to anterior ascultation GI: Soft.  No distension. There is no tenderness.  Skin: WDI  Neuro: Mental Status: Patient keeps eyes closed despite being awake (eyelid apraxia). She does follow commands, gives month as November and does not know her age. Cranial Nerves: II: Left hemianopia. Right pupil is postsurgical, left is relatively small but reactive. III,IV, VI: She has a right gaze preference. V: Facial sensation is symmetric to temperature VII: Facial movement is decreased on the left VIII: hearing is intact to voice X: Uvula elevates symmetrically XI: Shoulder shrug is symmetric. XII: tongue is midline without atrophy or fasciculations.  Motor: She has relatively severe left hemiparesis, with 2-3 out of 5 strength in the arm and leg. She is able to lift her right arm against gravity. Sensory: She has decreased sensation on the left with double simultaneous stimulation extinction Cerebellar: Does not perform    I have reviewed labs in epic and the results pertinent to this consultation are: CBC-unremarkable CMP, mildly elevated creatinine  I have reviewed the images obtained: CT  head-unremarkable  Impression: 82 year old female with new onset left-sided weakness consistent with a moderate to large right MCA stroke. She is outside the window for IV or IA TPA. She is on dual antiplatelet therapy at home, Bonney Roussel continue this for now. Suspect that she may no longer be ambulatory after the stroke and therefore consideration of anticoagulation might need to be made. She does not have a history of documented atrial fibrillation. Her pattern of previous infarctions were  mostly posterior circulation and therefore I think getting a better look with MRA head and neck in the posterior circulation would be useful.  Recommendations: 1. HgbA1c, fasting lipid panel 2. MRI, MRA  of the brain without contrast, MRA neck with contrast 3. Frequent neuro checks 4. Echocardiogram 5. Carotid dopplers are not needed given MRA 6. Prophylactic therapy-Antiplatelet med: Aspirin and Plavix. 7. Risk factor modification 8. Telemetry monitoring 9. PT consult, OT consult, Speech consult 10. please page stroke NP  Or  PA  Or MD  M-F from 8am -4 pm starting 3/8 as this patient will be followed by the stroke team at this point.   You can look them up on [email or web address]  Password [email or web address]    Roland Rack, MD [facility] [phone]  If 7pm- 7am, please page neurology on call as listed in [facility].

## 2015-09-11 NOTE — Progress Notes (Signed)
STROKE TEAM PROGRESS NOTE   HISTORY OF PRESENT ILLNESS Darlene Murray is a 82 y.o. female with a history of multiple recent strokes who presents with new onset left sided weakness that started over night. Her husband states that she awoke around 4 AM, and at that time was having difficulty with her left side and was not opening her eyes. Of note, she has had multiple strokes in the past few months. There is no embolic appearance to the strokes, however anticoagulation was not felt to be appropriate given her fall risk and therefore valuation with long-term Holter monitor or TEE have not been undertaken. She is able at baseline to walk a little ways with a walker, usually with help. She was LKW 7 pm on 09/10/2015. Premorbid modified rankin scale: 3. Patient was not administered IV t-PA secondary to out of window, stroke within 3 months. She was admitted for further evaluation and treatment.   SUBJECTIVE (INTERVAL HISTORY) Her family are at the bedside.  Patient jokingly said she is back "for some rest". Overall she feels her condition is stable. Her family is frustrated with recurrent strokes.   OBJECTIVE Temp:  [97.9 F (36.6 C)-98.1 F (36.7 C)] 98.1 F (36.7 C) (03/07 1005) Pulse Rate:  [59-62] 59 (03/07 1005) Cardiac Rhythm:  [-]  Resp:  [15-22] 15 (03/07 1005) BP: (132-140)/(65-82) 140/74 mmHg (03/07 1005) SpO2:  [99 %-100 %] 99 % (03/07 1005)  CBC:   Recent Labs Lab 09/11/15 0544 09/11/15 0552  WBC 7.7  --   NEUTROABS 3.4  --   HGB 13.3 14.3  HCT 41.2 42.0  MCV 88.4  --   PLT 227  --     Basic Metabolic Panel:   Recent Labs Lab 09/11/15 0544 09/11/15 0552  NA 143 143  K 4.1 4.1  CL 108 104  CO2 27  --   GLUCOSE 80 75  BUN 16 21*  CREATININE 1.11* 1.00  CALCIUM 8.8*  --     Lipid Panel:     Component Value Date/Time   CHOL 111 08/03/2015 0230   TRIG 54 08/03/2015 0230   HDL 47 08/03/2015 0230   CHOLHDL 2.4 08/03/2015 0230   VLDL 11 08/03/2015 0230   LDLCALC  53 08/03/2015 0230   HgbA1c:  Lab Results  Component Value Date   HGBA1C 5.8* 08/03/2015   Urine Drug Screen:     Component Value Date/Time   LABOPIA NONE DETECTED 09/11/2015 0730   COCAINSCRNUR NONE DETECTED 09/11/2015 0730   LABBENZ NONE DETECTED 09/11/2015 0730   AMPHETMU NONE DETECTED 09/11/2015 0730   THCU NONE DETECTED 09/11/2015 0730   LABBARB NONE DETECTED 09/11/2015 0730      IMAGING  MRI HEAD  09/11/2015  Exam is motion degraded. Acute nonhemorrhagic infarct right thalamus and posterior limb right internal capsule. Question tiny acute medial left occipital lobe infarct. Remote bilateral cerebellar infarcts. Remote infarct right middle cerebellar peduncle. Remote thalamic infarcts. Mild small vessel disease changes. Global atrophy without hydrocephalus. No intracranial hemorrhage. No intracranial enhancing lesion. Longstanding complete opacification right sphenoid sinus without expansion. Inspissated material suspected. Mucosal thickening remainder of paranasal sinuses with opacification posterior left ethmoid sinus air cells. Elongated right globe.  Post lens replacement. .  MRA HEAD  09/11/2015  Exam is motion degraded. Mild narrowing pre cavernous and supraclinoid segment of the internal carotid artery bilaterally with ectasia of cavernous segment bilaterally. Mild narrowing A1 segment anterior cerebral artery bilaterally. Moderate narrowing A2 segment anterior cerebral artery bilaterally. Moderate to  marked narrowing middle cerebral artery branches bilaterally. Marked narrowing distal left vertebral artery. Moderate to marked narrowing distal right vertebral artery. Mild narrowing basilar artery. Nonvisualized posterior inferior cerebellar arteries, anterior inferior cerebellar arteries and superior cerebellar arteries. Mark narrowing posterior cerebral arteries bilaterally more notable on the left. Prominent basilar tip may reflect atherosclerotic changes rather than saccular  aneurysm.   MRI NECK  09/11/2015  Exam is motion degraded. Ectatic common carotid arteries bilaterally. Loss of signal right common carotid artery may be related to artifact rather than true stenosis. Appearance of hemodynamically significant stenosis proximal internal carotid artery bilaterally may be accentuated by motion artifact. Mild to moderate narrowing proximal 3 cm internal carotid artery bilaterally. Poor delineation majority of the left vertebral artery. Moderate to marked narrowing proximal right vertebral artery. Mild to moderate narrowing proximal right subclavian artery. Moderate to marked tandem stenosis left subclavian artery.   Ct Head Code Stroke W/o Cm 09/11/2015   No intracranial hemorrhage. Several prior remote infarcts with appearance of evolution/progression of right thalamic and posterior limb right internal capsule infarct. Moderate small vessel disease changes. Global atrophy without hydrocephalus. Opacification right sphenoid sinus. Mild mucosal thickening maxillary sinuses and ethmoid sinus air cells greater on the left.    PHYSICAL EXAM Pleasant elderly african american lady not in distress. . Afebrile. Head is nontraumatic. Neck is supple without bruit.    Cardiac exam no murmur or gallop. Lungs are clear to auscultation. Distal pulses are well felt. Neurological Exam :  Awake alert oriented x 3 normal speech and language.Rt pupil irregular post surgical and diminished acuity. Left hemianopia.Fundi not visualized. Mild left lower face asymmetry. Tongue midline. No drift. Mild diminished fine finger movements on left. Orbits right over left upper extremity. Mild left grip weak.. Diminished left hemibody sensation . Normal coordination. ASSESSMENT/PLAN [name] is a 82 y.o. female with history of multiple recent strokes presenting with left sided weakness. She did not receive IV t-PA due to out of the window, stroke within 3 months.   Stroke:   R thalamic, R Posterior  limb internal capsule and L occipital lobe lacunar infarcts secondary to small vessel disease and diffuse intracranial atherosclerosis  MRI  R thalamic and R PLIC infarct, L occipital lobe infarct.   MRA head  Diffuse intracranial atherosclerosis. No vessels amenable to intervention.  MRA neck   Narrowing of vessels, doubt hemodynamically significant stenosis  2D Echo  No source of embolus seen on last 2D in Oct 2016, no need to repeat. Current 2D canceled  LDL 53 in Jan.   HgbA1c 5.8 in Jan  Lovenox 40 mg sq daily for VTE prophylaxis Diet NPO time specified  aspirin 81 mg daily and clopidogrel 75 mg daily prior to admission, now on aspirin 325 mg daily and clopidogrel 75 mg daily. Ok to continue home dose of antiplatelets at time of discharge  Ongoing aggressive stroke risk factor management  Therapy recommendations:  pending (already received HHA and HH PT from [facility] prior to admission)  Disposition:  pending  NOTHING FURTHER TO ADD FROM THE STROKE STANDPOINT  Given ongoing frequent strokes, Patient has a 10-15% risk of having another stroke over the next year, the highest risk is within 2 weeks of the most recent stroke/TIA (risk of having a stroke following a stroke or TIA is the same). Unfortunately, there is nothing different or new to offer.  Current strokes are lacunar, not embolic as previous infarcts felt to be. Even if they were, patient is  not a good anticoagulation candidate due to dementia and fall risk.  Ongoing risk factor control by Primary Care Physician  Stroke Service will sign off. Please call should any needs arise.  Follow-up Stroke Clinic at [facility] with Dr. Krista Blue as previously arranged during last visit  Hypertension  Stable  Hyperlipidemia  Home meds:  pravachol 20, resumed in hospital  LDL 53 in Jan, at goal < 70  Continue statin at discharge  Other Stroke Risk Factors  Advanced age  Passive Cigarette smoker, advised to  stop smoking  Hx stroke/TIA  07/2015 R brain TIA  03/2015 L hippocampus hemorrhagic, R posterior upper and mid pontine nonhemorrhagic, R cerebllar, L thalmic infarcts, embolic source suspected  Other Active Problems  Blind  Mild cognitive impairment  Gait difficulty, multifactorial d/t recurrent stroke and cervical spondylitic myelopathy  Asthma on [facility] day # 0  BIBY,SHARON  [place] for Pager information 09/11/2015 1:11 PM  I have personally examined this patient, reviewed notes, independently viewed imaging studies, participated in medical decision making and plan of care. I have made any additions or clarifications directly to the above note. Agree with note above.She has h/o multiple prior strokes and TIA and presented with new left sided weakness and remains at risk for more strokes,TIA, neurological worsening.Continue ongoing neurological care and will not repeat extensive stroke testing as it was done few months ago.long d/w patient and son and answered questions.  Antony Contras, MD Medical Director [place] Pager: [phone] 09/11/2015 9:53 PM    To contact Stroke Continuity provider, please refer to [email or web address] After hours, contact General Neurology

## 2015-09-11 NOTE — Progress Notes (Signed)
[facility]  Patient Status: Active (receiving services up to time of hospitalization)  [facility] is providing the following services: PT and HHA  If patient discharges after hours, please call [phone].   Janae Sauce 09/11/2015, 3:22 PM

## 2015-09-11 NOTE — Evaluation (Signed)
Clinical/Bedside Swallow Evaluation Patient Details  Name: Darlene Murray MRN: [id number] Date of Birth: 01-Jul-1922  Today's Date: 09/11/2015 Time: SLP Start Time (ACUTE ONLY): 1558 SLP Stop Time (ACUTE ONLY): 1610 SLP Time Calculation (min) (ACUTE ONLY): 12 min  Past Medical History:  Past Medical History  Diagnosis Date  . Blindness   . Asthmatic bronchitis   . Asthma   . Fibrocystic breast disease   . Allergic rhinitis   . Hyperlipidemia   . Venous insufficiency     lower extremities  . HNP (herniated nucleus pulposus), lumbar     L4-L5  . Left ventricular hypertrophy 2008    mild  . Antibiotic-associated diarrhea   . Stroke (cerebrum) [facility])    Past Surgical History:  Past Surgical History  Procedure Laterality Date  . Cataract extraction    . Colonoscopy     HPI:   82 y.o. female who presented with L sided weakness. Was unable to move her left side. Of note patient also will not open her eyes family brought patient to the emergency room. Of note patient has had previous strokes which left her with right-sided weakness. Currently patient's left sided weakness is somewhat improved but now she has left facial droop and slurred speech which is worse. Patient is not acting herself per family.    Assessment / Plan / Recommendation Clinical Impression   Pt presents with grossly intact oropharyngeal swallowing function.  No significant areas of focal oral motor weakness on exam impacting oral phase of swallow.  Pt able to clear solids from the oral cavity post swallow without assistance.  No overt s/s of aspiration evident with thin liquids via straw despite large consecutive boluses.  Vocal quality remained clear both before and after trials.  Pt's husband reports no previous history of dysphagia and was on a regular diet prior to admission with good PO intake.  As a result, recommend initiating pt on a regular diet with thin liquids.  SLP will follow up x1 to assess diet toleration  given mild aspiration risk due to cognitive deficits.  Orders also received for cognitive-linguistic evaluation dated 09/12/2015 which SLP will complete at earliest available appointment.      Aspiration Risk  Mild aspiration risk (due to cognition )    Diet Recommendation Regular;Thin liquid   Liquid Administration via: Cup;Straw Medication Administration: Whole meds with liquid Supervision: Patient able to self feed Compensations: Minimize environmental distractions;Slow rate;Small sips/bites    Other  Recommendations Oral Care Recommendations: Oral care BID   Follow up Recommendations  Other (comment) (TBD)    Frequency and Duration min 2x/week  1 week       Prognosis        Swallow Study   General HPI:  82 y.o. female who presented with L sided weakness. Was unable to move her left side. Of note patient also will not open her eyes family brought patient to the emergency room. Of note patient has had previous strokes which left her with right-sided weakness. Currently patient's left sided weakness is somewhat improved but now she has left facial droop and slurred speech which is worse. Patient is not acting herself per family.  Type of Study: Bedside Swallow Evaluation Previous Swallow Assessment: none on record Diet Prior to this Study: NPO Temperature Spikes Noted: No Respiratory Status: Room air History of Recent Intubation: No Behavior/Cognition: Alert;Cooperative;Pleasant mood Oral Cavity Assessment: Within Functional Limits Oral Cavity - Dentition: Dentures, top;Dentures, bottom Vision: Functional for self-feeding Self-Feeding Abilities: Able to  feed self Patient Positioning: Upright in bed Baseline Vocal Quality: Normal Volitional Cough: Strong    Oral/Motor/Sensory Function Overall Oral Motor/Sensory Function: Within functional limits   Ice Chips     Thin Liquid Thin Liquid: Within functional limits Presentation: Straw    Nectar Thick     Honey Thick      Puree Puree: Within functional limits   Solid   GO   Solid: Within functional limits        Darlene Murray L 09/11/2015,4:22 PM

## 2015-09-11 NOTE — ED Provider Notes (Signed)
CSN: [id number]     Arrival date & time 09/11/15  0530 History   First MD Initiated Contact with Patient 09/11/15 0532     Chief Complaint  Patient presents with  . Cerebrovascular Accident    Level V caveat due to the acuity of condition (Consider location/radiation/quality/duration/timing/severity/associated sxs/prior Treatment) HPI  Darlene Murray is a 82 y.o. female with past medical history of recurrent strokes presenting today with strokelike symptoms. History obtained by EMS he states around 7 PM patient developed facial droop, slurred speech and left-sided weakness. In route the patient's symptoms resolved. Patient is unable to give any further history due to the acuity of her condition.   Past Medical History  Diagnosis Date  . Blindness   . Asthmatic bronchitis   . Asthma   . Fibrocystic breast disease   . Allergic rhinitis   . Hyperlipidemia   . Venous insufficiency     lower extremities  . HNP (herniated nucleus pulposus), lumbar     L4-L5  . Left ventricular hypertrophy 2008    mild  . Antibiotic-associated diarrhea   . Stroke (cerebrum) [facility])    Past Surgical History  Procedure Laterality Date  . Cataract extraction    . Colonoscopy     Family History  Problem Relation Age of Onset  . Heart attack Sister   . Alzheimer's disease Sister   . Asthma Son    Social History  Substance Use Topics  . Smoking status: Passive Smoke Exposure - Never Smoker  . Smokeless tobacco: Never Used     Comment: Prior exposure through husband.   . Alcohol Use: No   OB History    No data available     Review of Systems  Unable to perform ROS: Acuity of condition      Allergies  Review of patient's allergies indicates no known allergies.  Home Medications   Prior to Admission medications   Medication Sig Start Date End Date Taking? Authorizing Provider  albuterol (PROVENTIL HFA;VENTOLIN HFA) 108 (90 Base) MCG/ACT inhaler Inhale 2 puffs into the lungs every 6 (six)  hours as needed for wheezing or shortness of breath. 07/30/15  Yes Rigoberto Noel, MD  aspirin 81 MG tablet Take 81 mg by mouth daily.   Yes Historical Provider, MD  calcium carbonate (OS-CAL) 600 MG TABS Take 600 mg by mouth daily.     Yes Historical Provider, MD  cholecalciferol (VITAMIN D) 1000 UNITS tablet Take 1,000 Units by mouth daily.   Yes Historical Provider, MD  clopidogrel (PLAVIX) 75 MG tablet Take 1 tablet (75 mg total) by mouth daily. 06/13/15  Yes Marcial Pacas, MD  mometasone-formoterol (DULERA) 100-5 MCG/ACT AERO Inhale 2 puffs into the lungs 2 (two) times daily. 08/22/15  Yes Rigoberto Noel, MD  Multiple Vitamin (MULTIVITAMIN) tablet Take 1 tablet by mouth daily.   Yes Historical Provider, MD  pravastatin (PRAVACHOL) 20 MG tablet Take 1 tablet (20 mg total) by mouth daily at 6 PM. 05/23/15  Yes Marcial Pacas, MD  predniSONE (DELTASONE) 5 MG tablet Take 1 tablet (5 mg total) by mouth daily. 04/20/15  Yes Daniel J Angiulli, PA-C   BP 132/82 mmHg  Pulse 59  Temp(Src) 97.9 F (36.6 C) (Oral)  Resp 15  SpO2 99% Physical Exam  Constitutional: She appears well-developed and well-nourished. No distress.  HENT:  Head: Normocephalic and atraumatic.  Nose: Nose normal.  Mouth/Throat: Oropharynx is clear and moist. No oropharyngeal exudate.  Eyes: No scleral icterus.  Patient keeps  her eyes closed however the right pupil appears larger than the left  Neck: Normal range of motion. Neck supple. No JVD present. No tracheal deviation present. No thyromegaly present.  Cardiovascular: Normal rate, regular rhythm and normal heart sounds.  Exam reveals no gallop and no friction rub.   No murmur heard. Pulmonary/Chest: Effort normal and breath sounds normal. No respiratory distress. She has no wheezes. She exhibits no tenderness.  Abdominal: Soft. Bowel sounds are normal. She exhibits no distension and no mass. There is no tenderness. There is no rebound and no guarding.  Musculoskeletal: Normal range  of motion. She exhibits no edema or tenderness.  Lymphadenopathy:    She has no cervical adenopathy.  Neurological: She is alert. She exhibits normal muscle tone.  Normal strength and sensation in all extremities.  Intermittently follows commands  Skin: Skin is warm and dry. No rash noted. No erythema. No pallor.  Nursing note and vitals reviewed.   ED Course  Procedures (including critical care time) Labs Review Labs Reviewed  CBC - Abnormal; Notable for the following:    RDW 16.1 (*)    All other components within normal limits  COMPREHENSIVE METABOLIC PANEL - Abnormal; Notable for the following:    Creatinine, Ser 1.11 (*)    Calcium 8.8 (*)    Total Protein 5.8 (*)    Albumin 3.0 (*)    GFR calc non Af Amer 42 (*)    GFR calc Af Amer 49 (*)    All other components within normal limits  I-STAT CHEM 8, ED - Abnormal; Notable for the following:    BUN 21 (*)    Calcium, Ion 1.11 (*)    All other components within normal limits  ETHANOL  PROTIME-INR  APTT  DIFFERENTIAL  URINE RAPID DRUG SCREEN, HOSP PERFORMED  URINALYSIS, ROUTINE W REFLEX MICROSCOPIC (NOT AT [facility])  I-STAT TROPOININ, ED  CBG MONITORING, ED    Imaging Review Ct Head Code Stroke W/o Cm  09/11/2015  CLINICAL DATA:  82 year old female with slurred speech, left-sided weakness and left facial droop. History stroke. Initial encounter. EXAM: CT HEAD WITHOUT CONTRAST TECHNIQUE: Contiguous axial images were obtained from the base of the skull through the vertex without intravenous contrast. COMPARISON:  08/02/2015 brain MR and head CT. FINDINGS: No intracranial hemorrhage. Several prior remote infarcts with appearance of evolution/progression of right thalamic and posterior limb right internal capsule infarct. Moderate small vessel disease changes. Global atrophy without hydrocephalus. No intracranial mass lesion noted on this unenhanced exam. Exophthalmos. Opacification right sphenoid sinus. Mild mucosal thickening  maxillary sinuses and ethmoid sinus air cells greater on the left. IMPRESSION: No intracranial hemorrhage. Several prior remote infarcts with appearance of evolution/progression of right thalamic and posterior limb right internal capsule infarct. Moderate small vessel disease changes. Global atrophy without hydrocephalus. Opacification right sphenoid sinus. Mild mucosal thickening maxillary sinuses and ethmoid sinus air cells greater on the left. These results were called by telephone at the time of interpretation on 09/11/2015 at 6:39 am to Dr. Everlene Balls , who verbally acknowledged these results. Electronically Signed   By: Genia Del M.D.   On: 09/11/2015 06:41   I have personally reviewed and evaluated these images and lab results as part of my medical decision-making.   EKG Interpretation   Date/Time:  Tuesday September 11 2015 05:39:48 EST Ventricular Rate:  61 PR Interval:  227 QRS Duration: 151 QT Interval:  462 QTC Calculation: 465 R Axis:   -88 Text Interpretation:  Sinus  rhythm Prolonged PR interval RBBB and LAFB  Probable left ventricular hypertrophy No significant change since last  tracing Confirmed by Glynn Octave [id number]) on 09/11/2015 6:21:38 AM      MDM   Final diagnoses:  Left-sided weakness    Patient presents emergency department for stroke like symptoms. She is well outside the window. Stroke workup was performed and Dr. Leonel Ramsay with neurology evaluated the patient and recommends for hospitalist admission for further evaluation of stroke. I paged the hospitalist for further care.    Everlene Balls, MD 09/11/15 [phone]

## 2015-09-12 DIAGNOSIS — I639 Cerebral infarction, unspecified: Secondary | ICD-10-CM

## 2015-09-12 DIAGNOSIS — I6930 Unspecified sequelae of cerebral infarction: Secondary | ICD-10-CM

## 2015-09-12 DIAGNOSIS — I6381 Other cerebral infarction due to occlusion or stenosis of small artery: Secondary | ICD-10-CM | POA: Diagnosis present

## 2015-09-12 DIAGNOSIS — J4540 Moderate persistent asthma, uncomplicated: Secondary | ICD-10-CM | POA: Diagnosis present

## 2015-09-12 DIAGNOSIS — J4550 Severe persistent asthma, uncomplicated: Secondary | ICD-10-CM

## 2015-09-12 DIAGNOSIS — M6289 Other specified disorders of muscle: Secondary | ICD-10-CM

## 2015-09-12 LAB — LIPID PANEL
CHOL/HDL RATIO: 2.9 ratio
CHOLESTEROL: 113 mg/dL (ref 0–200)
HDL: 39 mg/dL — ABNORMAL LOW (ref >40)
LDL CALC: 64 mg/dL (ref 0–99)
Triglycerides: 48 mg/dL (ref <150)
VLDL: 10 mg/dL (ref 0–40)

## 2015-09-12 NOTE — Evaluation (Signed)
Speech Language Pathology Evaluation Patient Details Name: Darlene Murray MRN: [id number] DOB: 1921-07-30 Today's Date: 09/12/2015 Time: 0800-0820 SLP Time Calculation (min) (ACUTE ONLY): 20 min  Problem List:  Patient Active Problem List   Diagnosis Date Noted  . Left-sided weakness 09/11/2015  . Stroke ([place]) 09/11/2015  . Cerebral infarction due to unspecified mechanism   . Acromioclavicular joint arthritis 08/20/2015  . Acute prerenal azotemia 08/02/2015  . CKD (chronic kidney disease), stage III 08/02/2015  . CVA (cerebral infarction) 08/02/2015  . Memory loss 06/15/2015  . Acute right-sided weakness 06/14/2015  . Spondylosis, cervical, with myelopathy 06/13/2015  . Ataxia S/P CVA 05/17/2015  . Gait disturbance, post-stroke 05/17/2015  . Sepsis ([place]) 04/23/2015  . Urinary tract infection 04/23/2015  . Acute hemorrhagic infarction of brain ([place]) 04/10/2015  . Ataxia   . History of mixed hemorraghic embolic stroke [id number]  . Weakness 04/06/2015  . Generalized weakness 04/06/2015  . Nausea with vomiting 04/06/2015  . Left ventricular diastolic dysfunction with preserved systolic function ([place]) [id number]  . Severe persistent asthma in adult steroid dependent   . Allergic rhinitis   . Hyperlipidemia   . Venous insufficiency   . HNP (herniated nucleus pulposus), lumbar   . Left ventricular hypertrophy    Past Medical History:  Past Medical History  Diagnosis Date  . Blindness   . Asthmatic bronchitis   . Asthma   . Fibrocystic breast disease   . Allergic rhinitis   . Hyperlipidemia   . Venous insufficiency     lower extremities  . HNP (herniated nucleus pulposus), lumbar     L4-L5  . Left ventricular hypertrophy 2008    mild  . Antibiotic-associated diarrhea   . Stroke (cerebrum) [facility])    Past Surgical History:  Past Surgical History  Procedure Laterality Date  . Cataract extraction    . Colonoscopy     HPI:   82 y.o. female who presented with L sided  weakness. Was unable to move her left side. Of note patient also will not open her eyes family brought patient to the emergency room. Of note patient has had previous strokes which left her with right-sided weakness. Currently patient's left sided weakness is somewhat improved but now she has left facial droop and slurred speech which is worse. Patient is not acting herself per family.  MRI showed acute right thalamic CVA and multiple old cvas.   Speech evaluation ordered = pt states she manages all home duties.     Assessment / Plan / Recommendation Clinical Impression  Pt has known h/o memory deficits per chart review and family not present currently to provide further baseline information.  Portions of [place] cognitive Assessment given* with pt showing strengths in areas of attention, naming, language and abstraction.  Areas of difficulties included orientation - she was oriented to self and birthdate but not situation, time or location.  Did not test memory portion or "math" due to pt's known premorbid deficits.  Pt states she manages home duties including bills, medications, appts, etc, however given known memory deficits would doubt this to be accurate.   Do not recommend follow up at this time given pt has 24/7 supervision  per chart review = support needed at home.  Recommended pt have family assist her with home management duties due to her premorbid memory loss.      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  Overall Cognitive Status: No family/caregiver present to determine baseline cognitive  functioning Arousal/Alertness: Awake/alert Orientation Level: Oriented to person;Disoriented to situation;Disoriented to time;Disoriented to place Memory:  (pt did not recall symptoms prior to admission) Awareness: Impaired Awareness Impairment: Intellectual impairment (to need for family/spouse to manage bills, appts, medications due to her memory deficit) Problem Solving:  (for basic needs - to call RN on her phone)    Comprehension  Auditory Comprehension Overall Auditory Comprehension: Appears within functional limits for tasks assessed Yes/No Questions: Not tested Commands: Within Functional Limits Conversation: Complex Other Conversation Comments: repetition required x2 during session EffectiveTechniques: Repetition Visual Recognition/Discrimination Discrimination: Not tested Reading Comprehension Reading Status: Not tested    Expression Expression Primary Mode of Expression: Verbal Verbal Expression Overall Verbal Expression: Appears within functional limits for tasks assessed Initiation: No impairment Level of Generative/Spontaneous Verbalization: Sentence Repetition: No impairment (for complex sentences) Pragmatics: No impairment Other Verbal Expression Comments: pt named 8 words in 60 seconds that started with "F" Written Expression Dominant Hand: Right Written Expression:  (small print size and not legible, ? baseline given h/o cvas)   Oral / Motor  Oral Motor/Sensory Function Overall Oral Motor/Sensory Function: Within functional limits Motor Speech Respiration: Within functional limits Phonation: Normal Resonance: Within functional limits Articulation: Within functional limitis (slight imprecise articulation but intelligible) Intelligibility: Intelligible Motor Planning: Witnin functional limits Motor Speech Errors: Not applicable   [place], [place] [facility] SLP [phone]

## 2015-09-12 NOTE — Progress Notes (Signed)
Speech Language Pathology Treatment: Dysphagia  Patient Details Name: Darlene Murray MRN: [id number] DOB: 1922/03/03 Today's Date: 09/12/2015 Time: 0258-5277 SLP Time Calculation (min) (ACUTE ONLY): 10 min  Assessment / Plan / Recommendation Clinical Impression  Pt reports no problems with swallowing or coughing during intake.  She has partially consumed breakfast tray stating uncertain if due to taste of food or lack of appetite.    SLP observed pt consuming water via straw - overall timely swallow with clear strong voice throughout.  She is afebrile on room air at this time and is able to self feed - which maximizes her airway protection.     Recommend continue regular/thin diet with general precautions.  No follow up indicated.       HPI HPI:  82 y.o. female who presented with L sided weakness. Was unable to move her left side. Of note patient also will not open her eyes family brought patient to the emergency room. Of note patient has had previous strokes which left her with right-sided weakness. Currently patient's left sided weakness is somewhat improved but now she has left facial droop and slurred speech which is worse. Patient is not acting herself per family.  MRI showed acute right thalamic CVA and multiple old cvas.   Speech evaluation ordered = pt states she manages all home duties.        SLP Plan  All goals met     Recommendations  Diet recommendations: Regular;Thin liquid Medication Administration: Whole meds with liquid Supervision: Patient able to self feed Compensations: Minimize environmental distractions;Slow rate;Small sips/bites Postural Changes and/or Swallow Maneuvers: Seated upright 90 degrees;Out of bed for meals             Follow up Recommendations: None Plan: All goals met     [place], [place] [facility] SLP [phone]

## 2015-09-12 NOTE — Progress Notes (Signed)
Rehab Admissions Coordinator Note:  Patient was screened by Cleatrice Burke for appropriateness for an Inpatient Acute Rehab Consult per PT recommendation. .  At this time, we are recommending Inpatient Rehab consult. I wil contact Dr. Clementeen Graham for order.  Cleatrice Burke 09/12/2015, 1:25 PM  I can be reached at [phone].

## 2015-09-12 NOTE — Progress Notes (Signed)
[facility] PROGRESS NOTE  Darlene Murray [id number] DOB: April 21, 1922 DOA: 09/11/2015 PCP: Gennette Pac, MD  Brief narrative 82 year old female, legally blind, history of multiple strokes (with residual right-sided weakness) who presented to the ED with acute onset of left-sided weakness. She had left facial droop and slurred speech and per family was not acting herself. Patient was not a TPA candidate as she was out of therapeutic window for TPA. She was admitted to hospitalist service for stroke workup.  Assessment/Plan: Acute stroke Right thalamus and right posterior limb internal capsule and left occipital lobe lacunar infarcts seen on MRI/MRA. Patient on full dose aspirin and Plavix. (Was on 81 mg aspirin and 75 mg daily Plavix prior to admission). Seen by neurology and since patient had 2-D echo done on 04/2015 recommended he does not need to be repeated. During her hospitalization 2 months back for stroke she was evaluated by cardiology and recommend no role for TEE/loop recorder as she was not a candidate for anticoagulation due to fall risk. Recent LDL of 53 and A1c of 5.8. No further stroke workup recommended by stroke team and recommends aggressive risk factor management. (Can resume home dose aspirin and Plavix upon discharge). Continue statin. Seen by PT and recommends CIR. Consulted. Follow-up with Dr. Krista Blue as outpatient.  Essential hypertension Stable. Not on any medications.  History of asthma Asymptomatic. Continue low-dose prednisone and Lyrica.  DVT prophylaxis: Subcutaneous Lovenox   diet: Heart healthy   Code Status: Full code Family Communication: Husband at bedside Disposition Plan: PT recommended she IR. Consult placed   Consultants:  Neurology  Procedures:  MRI brain/MRA head and neck  Antibiotics:  None  HPI/Subjective: Seen and examined. Denies any specific symptoms. Appears to have improved strength in her left  extremity  Objective: Filed Vitals:   09/12/15 0800 09/12/15 1007  BP: 128/66 119/67  Pulse: 81 84  Temp: 97.9 F (36.6 C) 98.6 F (37 C)  Resp:  16   No intake or output data in the 24 hours ending 09/12/15 1339 There were no vitals filed for this visit.  Exam:   General:  Elderly female not in distress  HEENT: No pallor, moist mucosa, no facial droop  Chest: Clear bilaterally  CVS: Normal S1 and S2, no murmurs  GI: Soft, nondistended, nontender  Musculoskeletal: Warm, no edema  CNS: Alert and  Oriented, cranial nerves II-12 intact  Data Reviewed: Basic Metabolic Panel:  Recent Labs Lab 09/11/15 0544 09/11/15 0552  NA 143 143  K 4.1 4.1  CL 108 104  CO2 27  --   GLUCOSE 80 75  BUN 16 21*  CREATININE 1.11* 1.00  CALCIUM 8.8*  --    Liver Function Tests:  Recent Labs Lab 09/11/15 0544  AST 20  ALT 17  ALKPHOS 43  BILITOT 0.7  PROT 5.8*  ALBUMIN 3.0*   No results for input(s): LIPASE, AMYLASE in the last 168 hours. No results for input(s): AMMONIA in the last 168 hours. CBC:  Recent Labs Lab 09/11/15 0544 09/11/15 0552  WBC 7.7  --   NEUTROABS 3.4  --   HGB 13.3 14.3  HCT 41.2 42.0  MCV 88.4  --   PLT 227  --    Cardiac Enzymes: No results for input(s): CKTOTAL, CKMB, CKMBINDEX, TROPONINI in the last 168 hours. BNP (last 3 results)  Recent Labs  12/16/14 1025  BNP 14.1    ProBNP (last 3 results) No results for input(s): PROBNP in the last 8760 hours.  CBG:  Recent Labs Lab 09/11/15 0544  GLUCAP 78    No results found for this or any previous visit (from the past 240 hour(s)).   Studies: Mr Virgel Paling Wo Contrast  09/11/2015  CLINICAL DATA:  82 year old female with slurred speech and left-sided weakness with left facial droop. History of stroke. Subsequent encounter. EXAM: MRI HEAD WITHOUT AND WITH CONTRAST AND MRA HEAD WITHOUT CONTRAST AND MRI NECK WITHOUT AND WITH CONTRAST TECHNIQUE: Multiplanar, multiecho pulse sequences  of the brain and surrounding structures were obtained without and with intravenous contrast. Angiographic images of the head were obtained using MRA technique without contrast. Multiplanar, multiecho pulse sequences of the neck and surrounding structures were obtained without and with intravenous contrast. CONTRAST:  31mL MULTIHANCE GADOBENATE DIMEGLUMINE 529 MG/ML IV SOLN COMPARISON:  09/11/2015 head CT.  08/02/2015 brain MR. FINDINGS: MRI HEAD FINDINGS Exam is motion degraded. Acute nonhemorrhagic infarct right thalamus and posterior limb right internal capsule. Question tiny acute medial left occipital lobe infarct. Remote bilateral cerebellar infarcts. Remote infarct right middle cerebellar peduncle. Remote thalamic infarcts. Mild small vessel disease changes. Global atrophy without hydrocephalus. No intracranial hemorrhage. No intracranial enhancing lesion. Longstanding complete opacification right sphenoid sinus without expansion. Inspissated material suspected. Mucosal thickening remainder of paranasal sinuses with opacification posterior left ethmoid sinus air cells. Elongated right globe.  Post lens replacement. Partially empty sella. Mild spinal stenosis upper cervical spine. Cervical medullary junction unremarkable. MRA HEAD FINDINGS Exam is motion degraded. Mild narrowing pre cavernous and supraclinoid segment of the internal carotid artery bilaterally with ectasia of cavernous segment bilaterally. Fetal contribution to the right posterior cerebral artery. Mild narrowing A1 segment anterior cerebral artery bilaterally. Moderate narrowing A2 segment anterior cerebral artery bilaterally. Moderate to marked narrowing middle cerebral artery branches bilaterally. Marked narrowing distal left vertebral artery. Moderate to marked narrowing distal right vertebral artery. Mild narrowing basilar artery. Nonvisualized posterior inferior cerebellar arteries, anterior inferior cerebellar arteries and superior  cerebellar arteries. Mark narrowing posterior cerebral arteries bilaterally more notable on the left. Prominent basilar tip may reflect atherosclerotic changes rather than saccular aneurysm. MRI NECK FINDINGS Exam is motion degraded. Ectatic common carotid arteries bilaterally. Loss of signal right common carotid artery may be related to artifact rather than true stenosis. Appearance of hemodynamically significant stenosis proximal internal carotid artery bilaterally may be accentuated by motion artifact. Mild to moderate narrowing proximal 3 cm internal carotid artery bilaterally. Poor delineation majority of the left vertebral artery. Moderate to marked narrowing proximal right vertebral artery. Mild to moderate narrowing proximal right subclavian artery. Moderate to marked tandem stenosis left subclavian artery. IMPRESSION: MRI HEAD Exam is motion degraded. Acute nonhemorrhagic infarct right thalamus and posterior limb right internal capsule. Question tiny acute medial left occipital lobe infarct. Remote bilateral cerebellar infarcts. Remote infarct right middle cerebellar peduncle. Remote thalamic infarcts. Mild small vessel disease changes. Global atrophy without hydrocephalus. No intracranial hemorrhage. No intracranial enhancing lesion. Longstanding complete opacification right sphenoid sinus without expansion. Inspissated material suspected. Mucosal thickening remainder of paranasal sinuses with opacification posterior left ethmoid sinus air cells. Elongated right globe.  Post lens replacement. MRA HEAD Exam is motion degraded. Mild narrowing pre cavernous and supraclinoid segment of the internal carotid artery bilaterally with ectasia of cavernous segment bilaterally. Mild narrowing A1 segment anterior cerebral artery bilaterally. Moderate narrowing A2 segment anterior cerebral artery bilaterally. Moderate to marked narrowing middle cerebral artery branches bilaterally. Marked narrowing distal left  vertebral artery. Moderate to marked narrowing distal right vertebral artery. Mild narrowing basilar  artery. Nonvisualized posterior inferior cerebellar arteries, anterior inferior cerebellar arteries and superior cerebellar arteries. Mark narrowing posterior cerebral arteries bilaterally more notable on the left. Prominent basilar tip may reflect atherosclerotic changes rather than saccular aneurysm. MRI NECK Exam is motion degraded. Ectatic common carotid arteries bilaterally. Loss of signal right common carotid artery may be related to artifact rather than true stenosis. Appearance of hemodynamically significant stenosis proximal internal carotid artery bilaterally may be accentuated by motion artifact. Mild to moderate narrowing proximal 3 cm internal carotid artery bilaterally. Poor delineation majority of the left vertebral artery. Moderate to marked narrowing proximal right vertebral artery. Mild to moderate narrowing proximal right subclavian artery. Moderate to marked tandem stenosis left subclavian artery. Electronically Signed   By: Genia Del M.D.   On: 09/11/2015 10:33   Mr Angiogram Neck W Wo Contrast  09/11/2015  CLINICAL DATA:  82 year old female with slurred speech and left-sided weakness with left facial droop. History of stroke. Subsequent encounter. EXAM: MRI HEAD WITHOUT AND WITH CONTRAST AND MRA HEAD WITHOUT CONTRAST AND MRI NECK WITHOUT AND WITH CONTRAST TECHNIQUE: Multiplanar, multiecho pulse sequences of the brain and surrounding structures were obtained without and with intravenous contrast. Angiographic images of the head were obtained using MRA technique without contrast. Multiplanar, multiecho pulse sequences of the neck and surrounding structures were obtained without and with intravenous contrast. CONTRAST:  30mL MULTIHANCE GADOBENATE DIMEGLUMINE 529 MG/ML IV SOLN COMPARISON:  09/11/2015 head CT.  08/02/2015 brain MR. FINDINGS: MRI HEAD FINDINGS Exam is motion degraded. Acute  nonhemorrhagic infarct right thalamus and posterior limb right internal capsule. Question tiny acute medial left occipital lobe infarct. Remote bilateral cerebellar infarcts. Remote infarct right middle cerebellar peduncle. Remote thalamic infarcts. Mild small vessel disease changes. Global atrophy without hydrocephalus. No intracranial hemorrhage. No intracranial enhancing lesion. Longstanding complete opacification right sphenoid sinus without expansion. Inspissated material suspected. Mucosal thickening remainder of paranasal sinuses with opacification posterior left ethmoid sinus air cells. Elongated right globe.  Post lens replacement. Partially empty sella. Mild spinal stenosis upper cervical spine. Cervical medullary junction unremarkable. MRA HEAD FINDINGS Exam is motion degraded. Mild narrowing pre cavernous and supraclinoid segment of the internal carotid artery bilaterally with ectasia of cavernous segment bilaterally. Fetal contribution to the right posterior cerebral artery. Mild narrowing A1 segment anterior cerebral artery bilaterally. Moderate narrowing A2 segment anterior cerebral artery bilaterally. Moderate to marked narrowing middle cerebral artery branches bilaterally. Marked narrowing distal left vertebral artery. Moderate to marked narrowing distal right vertebral artery. Mild narrowing basilar artery. Nonvisualized posterior inferior cerebellar arteries, anterior inferior cerebellar arteries and superior cerebellar arteries. Mark narrowing posterior cerebral arteries bilaterally more notable on the left. Prominent basilar tip may reflect atherosclerotic changes rather than saccular aneurysm. MRI NECK FINDINGS Exam is motion degraded. Ectatic common carotid arteries bilaterally. Loss of signal right common carotid artery may be related to artifact rather than true stenosis. Appearance of hemodynamically significant stenosis proximal internal carotid artery bilaterally may be accentuated by  motion artifact. Mild to moderate narrowing proximal 3 cm internal carotid artery bilaterally. Poor delineation majority of the left vertebral artery. Moderate to marked narrowing proximal right vertebral artery. Mild to moderate narrowing proximal right subclavian artery. Moderate to marked tandem stenosis left subclavian artery. IMPRESSION: MRI HEAD Exam is motion degraded. Acute nonhemorrhagic infarct right thalamus and posterior limb right internal capsule. Question tiny acute medial left occipital lobe infarct. Remote bilateral cerebellar infarcts. Remote infarct right middle cerebellar peduncle. Remote thalamic infarcts. Mild small vessel disease changes. Global atrophy  without hydrocephalus. No intracranial hemorrhage. No intracranial enhancing lesion. Longstanding complete opacification right sphenoid sinus without expansion. Inspissated material suspected. Mucosal thickening remainder of paranasal sinuses with opacification posterior left ethmoid sinus air cells. Elongated right globe.  Post lens replacement. MRA HEAD Exam is motion degraded. Mild narrowing pre cavernous and supraclinoid segment of the internal carotid artery bilaterally with ectasia of cavernous segment bilaterally. Mild narrowing A1 segment anterior cerebral artery bilaterally. Moderate narrowing A2 segment anterior cerebral artery bilaterally. Moderate to marked narrowing middle cerebral artery branches bilaterally. Marked narrowing distal left vertebral artery. Moderate to marked narrowing distal right vertebral artery. Mild narrowing basilar artery. Nonvisualized posterior inferior cerebellar arteries, anterior inferior cerebellar arteries and superior cerebellar arteries. Mark narrowing posterior cerebral arteries bilaterally more notable on the left. Prominent basilar tip may reflect atherosclerotic changes rather than saccular aneurysm. MRI NECK Exam is motion degraded. Ectatic common carotid arteries bilaterally. Loss of signal  right common carotid artery may be related to artifact rather than true stenosis. Appearance of hemodynamically significant stenosis proximal internal carotid artery bilaterally may be accentuated by motion artifact. Mild to moderate narrowing proximal 3 cm internal carotid artery bilaterally. Poor delineation majority of the left vertebral artery. Moderate to marked narrowing proximal right vertebral artery. Mild to moderate narrowing proximal right subclavian artery. Moderate to marked tandem stenosis left subclavian artery. Electronically Signed   By: Genia Del M.D.   On: 09/11/2015 10:33   Mr Jeri Cos [id number] Contrast  09/11/2015  CLINICAL DATA:  82 year old female with slurred speech and left-sided weakness with left facial droop. History of stroke. Subsequent encounter. EXAM: MRI HEAD WITHOUT AND WITH CONTRAST AND MRA HEAD WITHOUT CONTRAST AND MRI NECK WITHOUT AND WITH CONTRAST TECHNIQUE: Multiplanar, multiecho pulse sequences of the brain and surrounding structures were obtained without and with intravenous contrast. Angiographic images of the head were obtained using MRA technique without contrast. Multiplanar, multiecho pulse sequences of the neck and surrounding structures were obtained without and with intravenous contrast. CONTRAST:  69mL MULTIHANCE GADOBENATE DIMEGLUMINE 529 MG/ML IV SOLN COMPARISON:  09/11/2015 head CT.  08/02/2015 brain MR. FINDINGS: MRI HEAD FINDINGS Exam is motion degraded. Acute nonhemorrhagic infarct right thalamus and posterior limb right internal capsule. Question tiny acute medial left occipital lobe infarct. Remote bilateral cerebellar infarcts. Remote infarct right middle cerebellar peduncle. Remote thalamic infarcts. Mild small vessel disease changes. Global atrophy without hydrocephalus. No intracranial hemorrhage. No intracranial enhancing lesion. Longstanding complete opacification right sphenoid sinus without expansion. Inspissated material suspected. Mucosal thickening  remainder of paranasal sinuses with opacification posterior left ethmoid sinus air cells. Elongated right globe.  Post lens replacement. Partially empty sella. Mild spinal stenosis upper cervical spine. Cervical medullary junction unremarkable. MRA HEAD FINDINGS Exam is motion degraded. Mild narrowing pre cavernous and supraclinoid segment of the internal carotid artery bilaterally with ectasia of cavernous segment bilaterally. Fetal contribution to the right posterior cerebral artery. Mild narrowing A1 segment anterior cerebral artery bilaterally. Moderate narrowing A2 segment anterior cerebral artery bilaterally. Moderate to marked narrowing middle cerebral artery branches bilaterally. Marked narrowing distal left vertebral artery. Moderate to marked narrowing distal right vertebral artery. Mild narrowing basilar artery. Nonvisualized posterior inferior cerebellar arteries, anterior inferior cerebellar arteries and superior cerebellar arteries. Mark narrowing posterior cerebral arteries bilaterally more notable on the left. Prominent basilar tip may reflect atherosclerotic changes rather than saccular aneurysm. MRI NECK FINDINGS Exam is motion degraded. Ectatic common carotid arteries bilaterally. Loss of signal right common carotid artery may be related to artifact rather than  true stenosis. Appearance of hemodynamically significant stenosis proximal internal carotid artery bilaterally may be accentuated by motion artifact. Mild to moderate narrowing proximal 3 cm internal carotid artery bilaterally. Poor delineation majority of the left vertebral artery. Moderate to marked narrowing proximal right vertebral artery. Mild to moderate narrowing proximal right subclavian artery. Moderate to marked tandem stenosis left subclavian artery. IMPRESSION: MRI HEAD Exam is motion degraded. Acute nonhemorrhagic infarct right thalamus and posterior limb right internal capsule. Question tiny acute medial left occipital lobe  infarct. Remote bilateral cerebellar infarcts. Remote infarct right middle cerebellar peduncle. Remote thalamic infarcts. Mild small vessel disease changes. Global atrophy without hydrocephalus. No intracranial hemorrhage. No intracranial enhancing lesion. Longstanding complete opacification right sphenoid sinus without expansion. Inspissated material suspected. Mucosal thickening remainder of paranasal sinuses with opacification posterior left ethmoid sinus air cells. Elongated right globe.  Post lens replacement. MRA HEAD Exam is motion degraded. Mild narrowing pre cavernous and supraclinoid segment of the internal carotid artery bilaterally with ectasia of cavernous segment bilaterally. Mild narrowing A1 segment anterior cerebral artery bilaterally. Moderate narrowing A2 segment anterior cerebral artery bilaterally. Moderate to marked narrowing middle cerebral artery branches bilaterally. Marked narrowing distal left vertebral artery. Moderate to marked narrowing distal right vertebral artery. Mild narrowing basilar artery. Nonvisualized posterior inferior cerebellar arteries, anterior inferior cerebellar arteries and superior cerebellar arteries. Mark narrowing posterior cerebral arteries bilaterally more notable on the left. Prominent basilar tip may reflect atherosclerotic changes rather than saccular aneurysm. MRI NECK Exam is motion degraded. Ectatic common carotid arteries bilaterally. Loss of signal right common carotid artery may be related to artifact rather than true stenosis. Appearance of hemodynamically significant stenosis proximal internal carotid artery bilaterally may be accentuated by motion artifact. Mild to moderate narrowing proximal 3 cm internal carotid artery bilaterally. Poor delineation majority of the left vertebral artery. Moderate to marked narrowing proximal right vertebral artery. Mild to moderate narrowing proximal right subclavian artery. Moderate to marked tandem stenosis left  subclavian artery. Electronically Signed   By: Genia Del M.D.   On: 09/11/2015 10:33   Ct Head Code Stroke W/o Cm  09/11/2015  CLINICAL DATA:  82 year old female with slurred speech, left-sided weakness and left facial droop. History stroke. Initial encounter. EXAM: CT HEAD WITHOUT CONTRAST TECHNIQUE: Contiguous axial images were obtained from the base of the skull through the vertex without intravenous contrast. COMPARISON:  08/02/2015 brain MR and head CT. FINDINGS: No intracranial hemorrhage. Several prior remote infarcts with appearance of evolution/progression of right thalamic and posterior limb right internal capsule infarct. Moderate small vessel disease changes. Global atrophy without hydrocephalus. No intracranial mass lesion noted on this unenhanced exam. Exophthalmos. Opacification right sphenoid sinus. Mild mucosal thickening maxillary sinuses and ethmoid sinus air cells greater on the left. IMPRESSION: No intracranial hemorrhage. Several prior remote infarcts with appearance of evolution/progression of right thalamic and posterior limb right internal capsule infarct. Moderate small vessel disease changes. Global atrophy without hydrocephalus. Opacification right sphenoid sinus. Mild mucosal thickening maxillary sinuses and ethmoid sinus air cells greater on the left. These results were called by telephone at the time of interpretation on 09/11/2015 at 6:39 am to Dr. Everlene Balls , who verbally acknowledged these results. Electronically Signed   By: Genia Del M.D.   On: 09/11/2015 06:41    Scheduled Meds: .  stroke: mapping our early stages of recovery book   Does not apply Once  . aspirin  300 mg Rectal Daily   Or  . aspirin  325 mg Oral  Daily  . clopidogrel  75 mg Oral Daily  . enoxaparin (LOVENOX) injection  40 mg Subcutaneous Daily  . mometasone-formoterol  2 puff Inhalation BID  . pravastatin  20 mg Oral q1800  . predniSONE  5 mg Oral Daily   Continuous Infusions:    Time  spent: 25 minutes    Jeffrey Graefe  [facility] Pager [phone] 7PM-7AM, please contact night-coverage at [email or web address], password [facility] 09/12/2015, 1:39 PM  LOS: 1 day

## 2015-09-12 NOTE — Consult Note (Signed)
Physical Medicine and Rehabilitation Consult Reason for Consult: Right posterior limb internal capsule and left occipital lobe, right thalamic infarct Referring Physician: [facility]   HPI: Darlene Murray is a 82 y.o. right handed female with history asthma maintained on prednisone,  venous insufficiency and previous CVA with right-sided residual weakness and received inpatient rehabilitation services October 2016. Patient lives with  husband. She was able to ambulate short distances with a walker but primarily stayed in a wheelchair and was receiving home health therapies. Husband has been assisting minimally for showering toileting and dressing. One level home with level entry. Presented 09/11/2015 with left-sided weakness and slurred speech. MRI of the brain shows acute nonhemorrhagic infarct right thalamus and posterior limb right internal capsule. Tiny acute medial left occipital lobe infarct. Remote bilateral cerebellar infarcts. MRA MRI of the head and neck showed moderate to marked narrowing proximal right vertebral artery as well as right subclavian artery and left subclavian artery. Echocardiogram pending. Patient did not receive TPA. Neurology consulted and currently maintained on aspirin and Plavix for CVA prophylaxis. Subcutaneous Lovenox for DVT prophylaxis. Tolerating a regular diet.  Review of Systems  Constitutional: Negative for fever and chills.  HENT: Negative for hearing loss.   Eyes:       Decreased vision right eye  Respiratory: Negative for cough.        Short of breath with exertion  Cardiovascular: Positive for leg swelling. Negative for chest pain and palpitations.  Gastrointestinal: Positive for constipation. Negative for nausea and vomiting.  Genitourinary: Negative for dysuria and hematuria.  Musculoskeletal: Positive for myalgias and joint pain.  Skin: Negative for rash.  Neurological: Positive for focal weakness and weakness. Negative for seizures and headaches.    All other systems reviewed and are negative.  Past Medical History  Diagnosis Date  . Blindness   . Asthmatic bronchitis   . Asthma   . Fibrocystic breast disease   . Allergic rhinitis   . Hyperlipidemia   . Venous insufficiency     lower extremities  . HNP (herniated nucleus pulposus), lumbar     L4-L5  . Left ventricular hypertrophy 2008    mild  . Antibiotic-associated diarrhea   . Stroke (cerebrum) [facility])    Past Surgical History  Procedure Laterality Date  . Cataract extraction    . Colonoscopy     Family History  Problem Relation Age of Onset  . Heart attack Sister   . Alzheimer's disease Sister   . Asthma Son    Social History:  reports that she has been passively smoking.  She has never used smokeless tobacco. She reports that she does not drink alcohol or use illicit drugs. Allergies: No Known Allergies Medications Prior to Admission  Medication Sig Dispense Refill  . albuterol (PROVENTIL HFA;VENTOLIN HFA) 108 (90 Base) MCG/ACT inhaler Inhale 2 puffs into the lungs every 6 (six) hours as needed for wheezing or shortness of breath. 1 Inhaler 2  . aspirin 81 MG tablet Take 81 mg by mouth daily.    . calcium carbonate (OS-CAL) 600 MG TABS Take 600 mg by mouth daily.      . cholecalciferol (VITAMIN D) 1000 UNITS tablet Take 1,000 Units by mouth daily.    . clopidogrel (PLAVIX) 75 MG tablet Take 1 tablet (75 mg total) by mouth daily. 30 tablet 11  . mometasone-formoterol (DULERA) 100-5 MCG/ACT AERO Inhale 2 puffs into the lungs 2 (two) times daily. 3 Inhaler 1  . Multiple Vitamin (MULTIVITAMIN) tablet  Take 1 tablet by mouth daily.    . pravastatin (PRAVACHOL) 20 MG tablet Take 1 tablet (20 mg total) by mouth daily at 6 PM. 90 tablet 3  . predniSONE (DELTASONE) 5 MG tablet Take 1 tablet (5 mg total) by mouth daily. 30 tablet 5    Home: Home Living Family/patient expects to be discharged to:: Private residence Living Arrangements: Spouse/significant other Available  Help at Discharge: Family, Available 24 hours/day Type of Home: House Home Access: Level entry Home Layout: One level Bathroom Shower/Tub: Gaffer, Curtain Bathroom Toilet: Handicapped height Bathroom Accessibility: Yes Home Equipment: Environmental consultant - 2 wheels, Cane - single point, Grab bars - tub/shower, Civil engineer, contracting, Bedside commode, Wheelchair - manual  Functional History: Prior Function Level of Independence: Needs assistance Gait / Transfers Assistance Needed: Pt in w/c mostly with pt able to walk ~50 feet with the RW at a time.  ADL's / Homemaking Assistance Needed: husband reports he has been assisting minimally for showering, toileting, dressing Functional Status:  Mobility: Bed Mobility Overal bed mobility: Needs Assistance Bed Mobility: Supine to Sit Supine to sit: Mod assist General bed mobility comments: Pt was able to transition to EOB with mod assist for trunk elevation and scooting forward so feet were resting on floor. Bed pad also used for assist with scooting.  Transfers Overall transfer level: Needs assistance Equipment used: Rolling walker (2 wheeled) Transfers: Sit to/from Stand Sit to Stand: Min assist, +2 physical assistance General transfer comment: VC's for hand placement on seated surface for safety. Pt required assist +2 to power-up to full standing position.  Ambulation/Gait Ambulation/Gait assistance: Min assist, +2 physical assistance Ambulation Distance (Feet): 8 Feet Assistive device: Rolling walker (2 wheeled) Gait Pattern/deviations: Decreased stride length, Shuffle, Trunk flexed General Gait Details: VC's for sequencing and technique with the RW. +2 assist provided for balance support and walker management. Increased time for pt to side step to middle of chair prior to initiating stand>sit. Gait velocity: decreased Gait velocity interpretation: Below normal speed for age/gender    ADL:    Cognition: Cognition Overall Cognitive Status:  Impaired/Different from baseline Arousal/Alertness: Awake/alert Orientation Level: Oriented to person, Disoriented to place, Disoriented to time, Disoriented to situation Memory:  (pt did not recall symptoms prior to admission) Awareness: Impaired Awareness Impairment: Intellectual impairment (to need for family/spouse to manage bills, appts, medications due to her memory deficit) Problem Solving:  (for basic needs - to call RN on her phone) Cognition Arousal/Alertness: Awake/alert Behavior During Therapy: WFL for tasks assessed/performed Overall Cognitive Status: Impaired/Different from baseline Area of Impairment: Orientation, Memory, Following commands, Safety/judgement, Problem solving Orientation Level: Disoriented to, Time, Situation (Could not tell me her birth year but knew month/day) Memory: Decreased short-term memory Following Commands: Follows one step commands with increased time Safety/Judgement: Decreased awareness of safety, Decreased awareness of deficits Problem Solving: Slow processing, Decreased initiation, Requires verbal cues, Difficulty sequencing  Blood pressure 119/67, pulse 84, temperature 98.6 F (37 C), temperature source Oral, resp. rate 16, height 5\' 5"  (1.651 m), SpO2 99 %. Physical Exam  Vitals reviewed. Constitutional: She is oriented to person, place, and time. She appears well-developed and well-nourished.  82 year old African-American female sitting up in chair.  HENT:  Head: Normocephalic and atraumatic.  Eyes: Conjunctivae and EOM are normal. No scleral icterus.  Pupil slow to respond to light right greater than left  Neck: Normal range of motion. Neck supple. No thyromegaly present.  Cardiovascular: Normal rate and regular rhythm.   Respiratory: Effort normal and  breath sounds normal. No respiratory distress.  GI: Soft. Bowel sounds are normal. She exhibits no distension.  Musculoskeletal: She exhibits no edema or tenderness.  Neurological: She  is alert and oriented to person, place, and time. No cranial nerve deficit.  Patient able to provide her name and age.  She could not recall her latest rehabilitation stay.  Follows simple commands. Motor: LUE/LLE: 5/5 RUE/RLE: 4+/5 proximal to distal grossly  Skin: Skin is warm and dry.  Psychiatric: She has a normal mood and affect. Her behavior is normal. Thought content normal.    Results for orders placed or performed during the hospital encounter of 09/11/15 (from the past 24 hour(s))  Lipid panel     Status: Abnormal   Collection Time: 09/12/15  3:40 AM  Result Value Ref Range   Cholesterol 113 0 - 200 mg/dL   Triglycerides 48 <150 mg/dL   HDL 39 (L) >40 mg/dL   Total CHOL/HDL Ratio 2.9 RATIO   VLDL 10 0 - 40 mg/dL   LDL Cholesterol 64 0 - 99 mg/dL   Mr Virgel Paling Wo Contrast  09/11/2015  CLINICAL DATA:  82 year old female with slurred speech and left-sided weakness with left facial droop. History of stroke. Subsequent encounter. EXAM: MRI HEAD WITHOUT AND WITH CONTRAST AND MRA HEAD WITHOUT CONTRAST AND MRI NECK WITHOUT AND WITH CONTRAST TECHNIQUE: Multiplanar, multiecho pulse sequences of the brain and surrounding structures were obtained without and with intravenous contrast. Angiographic images of the head were obtained using MRA technique without contrast. Multiplanar, multiecho pulse sequences of the neck and surrounding structures were obtained without and with intravenous contrast. CONTRAST:  45mL MULTIHANCE GADOBENATE DIMEGLUMINE 529 MG/ML IV SOLN COMPARISON:  09/11/2015 head CT.  08/02/2015 brain MR. FINDINGS: MRI HEAD FINDINGS Exam is motion degraded. Acute nonhemorrhagic infarct right thalamus and posterior limb right internal capsule. Question tiny acute medial left occipital lobe infarct. Remote bilateral cerebellar infarcts. Remote infarct right middle cerebellar peduncle. Remote thalamic infarcts. Mild small vessel disease changes. Global atrophy without hydrocephalus. No  intracranial hemorrhage. No intracranial enhancing lesion. Longstanding complete opacification right sphenoid sinus without expansion. Inspissated material suspected. Mucosal thickening remainder of paranasal sinuses with opacification posterior left ethmoid sinus air cells. Elongated right globe.  Post lens replacement. Partially empty sella. Mild spinal stenosis upper cervical spine. Cervical medullary junction unremarkable. MRA HEAD FINDINGS Exam is motion degraded. Mild narrowing pre cavernous and supraclinoid segment of the internal carotid artery bilaterally with ectasia of cavernous segment bilaterally. Fetal contribution to the right posterior cerebral artery. Mild narrowing A1 segment anterior cerebral artery bilaterally. Moderate narrowing A2 segment anterior cerebral artery bilaterally. Moderate to marked narrowing middle cerebral artery branches bilaterally. Marked narrowing distal left vertebral artery. Moderate to marked narrowing distal right vertebral artery. Mild narrowing basilar artery. Nonvisualized posterior inferior cerebellar arteries, anterior inferior cerebellar arteries and superior cerebellar arteries. Mark narrowing posterior cerebral arteries bilaterally more notable on the left. Prominent basilar tip may reflect atherosclerotic changes rather than saccular aneurysm. MRI NECK FINDINGS Exam is motion degraded. Ectatic common carotid arteries bilaterally. Loss of signal right common carotid artery may be related to artifact rather than true stenosis. Appearance of hemodynamically significant stenosis proximal internal carotid artery bilaterally may be accentuated by motion artifact. Mild to moderate narrowing proximal 3 cm internal carotid artery bilaterally. Poor delineation majority of the left vertebral artery. Moderate to marked narrowing proximal right vertebral artery. Mild to moderate narrowing proximal right subclavian artery. Moderate to marked tandem stenosis left subclavian  artery.  IMPRESSION: MRI HEAD Exam is motion degraded. Acute nonhemorrhagic infarct right thalamus and posterior limb right internal capsule. Question tiny acute medial left occipital lobe infarct. Remote bilateral cerebellar infarcts. Remote infarct right middle cerebellar peduncle. Remote thalamic infarcts. Mild small vessel disease changes. Global atrophy without hydrocephalus. No intracranial hemorrhage. No intracranial enhancing lesion. Longstanding complete opacification right sphenoid sinus without expansion. Inspissated material suspected. Mucosal thickening remainder of paranasal sinuses with opacification posterior left ethmoid sinus air cells. Elongated right globe.  Post lens replacement. MRA HEAD Exam is motion degraded. Mild narrowing pre cavernous and supraclinoid segment of the internal carotid artery bilaterally with ectasia of cavernous segment bilaterally. Mild narrowing A1 segment anterior cerebral artery bilaterally. Moderate narrowing A2 segment anterior cerebral artery bilaterally. Moderate to marked narrowing middle cerebral artery branches bilaterally. Marked narrowing distal left vertebral artery. Moderate to marked narrowing distal right vertebral artery. Mild narrowing basilar artery. Nonvisualized posterior inferior cerebellar arteries, anterior inferior cerebellar arteries and superior cerebellar arteries. Mark narrowing posterior cerebral arteries bilaterally more notable on the left. Prominent basilar tip may reflect atherosclerotic changes rather than saccular aneurysm. MRI NECK Exam is motion degraded. Ectatic common carotid arteries bilaterally. Loss of signal right common carotid artery may be related to artifact rather than true stenosis. Appearance of hemodynamically significant stenosis proximal internal carotid artery bilaterally may be accentuated by motion artifact. Mild to moderate narrowing proximal 3 cm internal carotid artery bilaterally. Poor delineation majority of the  left vertebral artery. Moderate to marked narrowing proximal right vertebral artery. Mild to moderate narrowing proximal right subclavian artery. Moderate to marked tandem stenosis left subclavian artery. Electronically Signed   By: Genia Del M.D.   On: 09/11/2015 10:33   Mr Angiogram Neck W Wo Contrast  09/11/2015  CLINICAL DATA:  82 year old female with slurred speech and left-sided weakness with left facial droop. History of stroke. Subsequent encounter. EXAM: MRI HEAD WITHOUT AND WITH CONTRAST AND MRA HEAD WITHOUT CONTRAST AND MRI NECK WITHOUT AND WITH CONTRAST TECHNIQUE: Multiplanar, multiecho pulse sequences of the brain and surrounding structures were obtained without and with intravenous contrast. Angiographic images of the head were obtained using MRA technique without contrast. Multiplanar, multiecho pulse sequences of the neck and surrounding structures were obtained without and with intravenous contrast. CONTRAST:  93mL MULTIHANCE GADOBENATE DIMEGLUMINE 529 MG/ML IV SOLN COMPARISON:  09/11/2015 head CT.  08/02/2015 brain MR. FINDINGS: MRI HEAD FINDINGS Exam is motion degraded. Acute nonhemorrhagic infarct right thalamus and posterior limb right internal capsule. Question tiny acute medial left occipital lobe infarct. Remote bilateral cerebellar infarcts. Remote infarct right middle cerebellar peduncle. Remote thalamic infarcts. Mild small vessel disease changes. Global atrophy without hydrocephalus. No intracranial hemorrhage. No intracranial enhancing lesion. Longstanding complete opacification right sphenoid sinus without expansion. Inspissated material suspected. Mucosal thickening remainder of paranasal sinuses with opacification posterior left ethmoid sinus air cells. Elongated right globe.  Post lens replacement. Partially empty sella. Mild spinal stenosis upper cervical spine. Cervical medullary junction unremarkable. MRA HEAD FINDINGS Exam is motion degraded. Mild narrowing pre cavernous  and supraclinoid segment of the internal carotid artery bilaterally with ectasia of cavernous segment bilaterally. Fetal contribution to the right posterior cerebral artery. Mild narrowing A1 segment anterior cerebral artery bilaterally. Moderate narrowing A2 segment anterior cerebral artery bilaterally. Moderate to marked narrowing middle cerebral artery branches bilaterally. Marked narrowing distal left vertebral artery. Moderate to marked narrowing distal right vertebral artery. Mild narrowing basilar artery. Nonvisualized posterior inferior cerebellar arteries, anterior inferior cerebellar arteries and superior cerebellar arteries. [place]  narrowing posterior cerebral arteries bilaterally more notable on the left. Prominent basilar tip may reflect atherosclerotic changes rather than saccular aneurysm. MRI NECK FINDINGS Exam is motion degraded. Ectatic common carotid arteries bilaterally. Loss of signal right common carotid artery may be related to artifact rather than true stenosis. Appearance of hemodynamically significant stenosis proximal internal carotid artery bilaterally may be accentuated by motion artifact. Mild to moderate narrowing proximal 3 cm internal carotid artery bilaterally. Poor delineation majority of the left vertebral artery. Moderate to marked narrowing proximal right vertebral artery. Mild to moderate narrowing proximal right subclavian artery. Moderate to marked tandem stenosis left subclavian artery. IMPRESSION: MRI HEAD Exam is motion degraded. Acute nonhemorrhagic infarct right thalamus and posterior limb right internal capsule. Question tiny acute medial left occipital lobe infarct. Remote bilateral cerebellar infarcts. Remote infarct right middle cerebellar peduncle. Remote thalamic infarcts. Mild small vessel disease changes. Global atrophy without hydrocephalus. No intracranial hemorrhage. No intracranial enhancing lesion. Longstanding complete opacification right sphenoid sinus  without expansion. Inspissated material suspected. Mucosal thickening remainder of paranasal sinuses with opacification posterior left ethmoid sinus air cells. Elongated right globe.  Post lens replacement. MRA HEAD Exam is motion degraded. Mild narrowing pre cavernous and supraclinoid segment of the internal carotid artery bilaterally with ectasia of cavernous segment bilaterally. Mild narrowing A1 segment anterior cerebral artery bilaterally. Moderate narrowing A2 segment anterior cerebral artery bilaterally. Moderate to marked narrowing middle cerebral artery branches bilaterally. Marked narrowing distal left vertebral artery. Moderate to marked narrowing distal right vertebral artery. Mild narrowing basilar artery. Nonvisualized posterior inferior cerebellar arteries, anterior inferior cerebellar arteries and superior cerebellar arteries. Mark narrowing posterior cerebral arteries bilaterally more notable on the left. Prominent basilar tip may reflect atherosclerotic changes rather than saccular aneurysm. MRI NECK Exam is motion degraded. Ectatic common carotid arteries bilaterally. Loss of signal right common carotid artery may be related to artifact rather than true stenosis. Appearance of hemodynamically significant stenosis proximal internal carotid artery bilaterally may be accentuated by motion artifact. Mild to moderate narrowing proximal 3 cm internal carotid artery bilaterally. Poor delineation majority of the left vertebral artery. Moderate to marked narrowing proximal right vertebral artery. Mild to moderate narrowing proximal right subclavian artery. Moderate to marked tandem stenosis left subclavian artery. Electronically Signed   By: Genia Del M.D.   On: 09/11/2015 10:33   Mr Jeri Cos [id number] Contrast  09/11/2015  CLINICAL DATA:  82 year old female with slurred speech and left-sided weakness with left facial droop. History of stroke. Subsequent encounter. EXAM: MRI HEAD WITHOUT AND WITH CONTRAST  AND MRA HEAD WITHOUT CONTRAST AND MRI NECK WITHOUT AND WITH CONTRAST TECHNIQUE: Multiplanar, multiecho pulse sequences of the brain and surrounding structures were obtained without and with intravenous contrast. Angiographic images of the head were obtained using MRA technique without contrast. Multiplanar, multiecho pulse sequences of the neck and surrounding structures were obtained without and with intravenous contrast. CONTRAST:  71mL MULTIHANCE GADOBENATE DIMEGLUMINE 529 MG/ML IV SOLN COMPARISON:  09/11/2015 head CT.  08/02/2015 brain MR. FINDINGS: MRI HEAD FINDINGS Exam is motion degraded. Acute nonhemorrhagic infarct right thalamus and posterior limb right internal capsule. Question tiny acute medial left occipital lobe infarct. Remote bilateral cerebellar infarcts. Remote infarct right middle cerebellar peduncle. Remote thalamic infarcts. Mild small vessel disease changes. Global atrophy without hydrocephalus. No intracranial hemorrhage. No intracranial enhancing lesion. Longstanding complete opacification right sphenoid sinus without expansion. Inspissated material suspected. Mucosal thickening remainder of paranasal sinuses with opacification posterior left ethmoid sinus air cells. Elongated right globe.  Post lens replacement. Partially empty sella. Mild spinal stenosis upper cervical spine. Cervical medullary junction unremarkable. MRA HEAD FINDINGS Exam is motion degraded. Mild narrowing pre cavernous and supraclinoid segment of the internal carotid artery bilaterally with ectasia of cavernous segment bilaterally. Fetal contribution to the right posterior cerebral artery. Mild narrowing A1 segment anterior cerebral artery bilaterally. Moderate narrowing A2 segment anterior cerebral artery bilaterally. Moderate to marked narrowing middle cerebral artery branches bilaterally. Marked narrowing distal left vertebral artery. Moderate to marked narrowing distal right vertebral artery. Mild narrowing basilar  artery. Nonvisualized posterior inferior cerebellar arteries, anterior inferior cerebellar arteries and superior cerebellar arteries. Mark narrowing posterior cerebral arteries bilaterally more notable on the left. Prominent basilar tip may reflect atherosclerotic changes rather than saccular aneurysm. MRI NECK FINDINGS Exam is motion degraded. Ectatic common carotid arteries bilaterally. Loss of signal right common carotid artery may be related to artifact rather than true stenosis. Appearance of hemodynamically significant stenosis proximal internal carotid artery bilaterally may be accentuated by motion artifact. Mild to moderate narrowing proximal 3 cm internal carotid artery bilaterally. Poor delineation majority of the left vertebral artery. Moderate to marked narrowing proximal right vertebral artery. Mild to moderate narrowing proximal right subclavian artery. Moderate to marked tandem stenosis left subclavian artery. IMPRESSION: MRI HEAD Exam is motion degraded. Acute nonhemorrhagic infarct right thalamus and posterior limb right internal capsule. Question tiny acute medial left occipital lobe infarct. Remote bilateral cerebellar infarcts. Remote infarct right middle cerebellar peduncle. Remote thalamic infarcts. Mild small vessel disease changes. Global atrophy without hydrocephalus. No intracranial hemorrhage. No intracranial enhancing lesion. Longstanding complete opacification right sphenoid sinus without expansion. Inspissated material suspected. Mucosal thickening remainder of paranasal sinuses with opacification posterior left ethmoid sinus air cells. Elongated right globe.  Post lens replacement. MRA HEAD Exam is motion degraded. Mild narrowing pre cavernous and supraclinoid segment of the internal carotid artery bilaterally with ectasia of cavernous segment bilaterally. Mild narrowing A1 segment anterior cerebral artery bilaterally. Moderate narrowing A2 segment anterior cerebral artery  bilaterally. Moderate to marked narrowing middle cerebral artery branches bilaterally. Marked narrowing distal left vertebral artery. Moderate to marked narrowing distal right vertebral artery. Mild narrowing basilar artery. Nonvisualized posterior inferior cerebellar arteries, anterior inferior cerebellar arteries and superior cerebellar arteries. Mark narrowing posterior cerebral arteries bilaterally more notable on the left. Prominent basilar tip may reflect atherosclerotic changes rather than saccular aneurysm. MRI NECK Exam is motion degraded. Ectatic common carotid arteries bilaterally. Loss of signal right common carotid artery may be related to artifact rather than true stenosis. Appearance of hemodynamically significant stenosis proximal internal carotid artery bilaterally may be accentuated by motion artifact. Mild to moderate narrowing proximal 3 cm internal carotid artery bilaterally. Poor delineation majority of the left vertebral artery. Moderate to marked narrowing proximal right vertebral artery. Mild to moderate narrowing proximal right subclavian artery. Moderate to marked tandem stenosis left subclavian artery. Electronically Signed   By: Genia Del M.D.   On: 09/11/2015 10:33   Ct Head Code Stroke W/o Cm  09/11/2015  CLINICAL DATA:  82 year old female with slurred speech, left-sided weakness and left facial droop. History stroke. Initial encounter. EXAM: CT HEAD WITHOUT CONTRAST TECHNIQUE: Contiguous axial images were obtained from the base of the skull through the vertex without intravenous contrast. COMPARISON:  08/02/2015 brain MR and head CT. FINDINGS: No intracranial hemorrhage. Several prior remote infarcts with appearance of evolution/progression of right thalamic and posterior limb right internal capsule infarct. Moderate small vessel disease changes. Global atrophy without hydrocephalus. No intracranial  mass lesion noted on this unenhanced exam. Exophthalmos. Opacification right  sphenoid sinus. Mild mucosal thickening maxillary sinuses and ethmoid sinus air cells greater on the left. IMPRESSION: No intracranial hemorrhage. Several prior remote infarcts with appearance of evolution/progression of right thalamic and posterior limb right internal capsule infarct. Moderate small vessel disease changes. Global atrophy without hydrocephalus. Opacification right sphenoid sinus. Mild mucosal thickening maxillary sinuses and ethmoid sinus air cells greater on the left. These results were called by telephone at the time of interpretation on 09/11/2015 at 6:39 am to Dr. Everlene Balls , who verbally acknowledged these results. Electronically Signed   By: Genia Del M.D.   On: 09/11/2015 06:41    Assessment/Plan: Diagnosis: Right posterior limb internal capsule and left occipital lobe, right thalamic infarct Labs and images independently reviewed.  Records reviewed and summated above. Stroke: Continue secondary stroke prophylaxis and Risk Factor Modification listed below:   Antiplatelet therapy:   Blood Pressure Management:  Continue current medication with prn's with permisive HTN per primary team Statin Agent:   Pre-iabetes management:   Motor recovery: Fluoxetine  1. Does the need for close, 24 hr/day medical supervision in concert with the patient's rehab needs make it unreasonable for this patient to be served in a less intensive setting? Potentially  2. Co-Morbidities requiring supervision/potential complications: asthma (Cont meds, monitor O2 sats with increased activity), venous insufficiency and previous CVA with right-sided residual weakness  3. Due to safety, disease management, medication administration and patient education, does the patient require 24 hr/day rehab nursing? Potentially 4. Does the patient require coordinated care of a physician, rehab nurse, PT (1-2 hrs/day, 5 days/week) and OT (1-2 hrs/day, 5 days/week) to address physical and functional deficits in the  context of the above medical diagnosis(es)? Yes Addressing deficits in the following areas: balance, endurance, locomotion, strength, transferring, bathing, dressing, toileting and psychosocial support 5. Can the patient actively participate in an intensive therapy program of at least 3 hrs of therapy per day at least 5 days per week? Yes 6. The potential for patient to make measurable gains while on inpatient rehab is excellent 7. Anticipated functional outcomes upon discharge from inpatient rehab are modified independent and supervision  with PT, modified independent and supervision with OT, n/a with SLP. 8. Estimated rehab length of stay to reach the above functional goals is: 10-14 days. 9. Does the patient have adequate social supports and living environment to accommodate these discharge functional goals? Yes 10. Anticipated D/C setting: Home 11. Anticipated post D/C treatments: HH therapy and Home excercise program 12. Overall Rehab/Functional Prognosis: good  RECOMMENDATIONS: This patient's condition is appropriate for continued rehabilitative care in the following setting: CIR Patient has agreed to participate in recommended program. Yes Note that insurance prior authorization may be required for reimbursement for recommended care.  Comment: Rehab Admissions Coordinator to follow up.  Delice Lesch, MD 09/12/2015

## 2015-09-12 NOTE — Evaluation (Signed)
Physical Therapy Evaluation Patient Details Name: Darlene Murray MRN: [id number] DOB: 1922/03/14 Today's Date: 09/12/2015   History of Present Illness  Pt is a 82 y.o. female hx of prior CVA who presents with an acute R thalamus and internal capsule infarct.   Clinical Impression  Pt admitted with above diagnosis. Pt currently with functional limitations due to the deficits listed below (see PT Problem List). At the time of PT eval pt was able to perform transfers and ambulation with grossly +2 min assist or +1 mod assist. Had long discussion with pt's husband and son and it appears that although pt was requiring some assistance at times for ADL's, the patient and her husband were managing well at home and pt was fairly independent with use of w/c or RW. Feel this patient is a good candidate for CIR to improve overall functional independence and lessen burden of care on family prior to return home. Pt will benefit from skilled PT to increase their independence and safety with mobility to allow discharge to the venue listed below.       Follow Up Recommendations CIR;Supervision/Assistance - 24 hour    Equipment Recommendations  None recommended by PT    Recommendations for Other Services       Precautions / Restrictions Precautions Precautions: Fall Restrictions Weight Bearing Restrictions: No      Mobility  Bed Mobility Overal bed mobility: Needs Assistance Bed Mobility: Supine to Sit     Supine to sit: Mod assist     General bed mobility comments: Pt was able to transition to EOB with mod assist for trunk elevation and scooting forward so feet were resting on floor. Bed pad also used for assist with scooting.   Transfers Overall transfer level: Needs assistance Equipment used: Rolling walker (2 wheeled) Transfers: Sit to/from Stand Sit to Stand: Min assist;+2 physical assistance         General transfer comment: VC's for hand placement on seated surface for safety. Pt  required assist +2 to power-up to full standing position.   Ambulation/Gait Ambulation/Gait assistance: Min assist;+2 physical assistance Ambulation Distance (Feet): 8 Feet Assistive device: Rolling walker (2 wheeled) Gait Pattern/deviations: Decreased stride length;Shuffle;Trunk flexed Gait velocity: decreased Gait velocity interpretation: Below normal speed for age/gender General Gait Details: VC's for sequencing and technique with the RW. +2 assist provided for balance support and walker management. Increased time for pt to side step to middle of chair prior to initiating stand>sit.  Stairs            Wheelchair Mobility    Modified Rankin (Stroke Patients Only) Modified Rankin (Stroke Patients Only) Pre-Morbid Rankin Score: Moderate disability Modified Rankin: Severe disability     Balance Overall balance assessment: Needs assistance Sitting-balance support: Feet supported;No upper extremity supported Sitting balance-Leahy Scale: Poor Sitting balance - Comments: Assist required initially for seated balance however pt was able to gain balance and sit without support for ~2 minutes (L lateral lean noted).  Postural control: Posterior lean;Left lateral lean Standing balance support: Bilateral upper extremity supported;During functional activity Standing balance-Leahy Scale: Poor                               Pertinent Vitals/Pain Pain Assessment: No/denies pain    Home Living Family/patient expects to be discharged to:: Private residence Living Arrangements: Spouse/significant other Available Help at Discharge: Family;Available 24 hours/day Type of Home: House Home Access: Level entry  Home Layout: One level Home Equipment: Walker - 2 wheels;Cane - single point;Grab bars - tub/shower;Shower seat;Bedside commode;Wheelchair - manual      Prior Function Level of Independence: Needs assistance   Gait / Transfers Assistance Needed: Pt in w/c mostly  with pt able to walk ~50 feet with the RW at a time.   ADL's / Homemaking Assistance Needed: husband reports he has been assisting minimally for showering, toileting, dressing        Hand Dominance   Dominant Hand: Right    Extremity/Trunk Assessment   Upper Extremity Assessment: Defer to OT evaluation           Lower Extremity Assessment: Generalized weakness (Bilateral weakness noted from old and new stroke)      Cervical / Trunk Assessment: Kyphotic  Communication   Communication: No difficulties  Cognition Arousal/Alertness: Awake/alert Behavior During Therapy: WFL for tasks assessed/performed Overall Cognitive Status: Impaired/Different from baseline Area of Impairment: Orientation;Memory;Following commands;Safety/judgement;Problem solving Orientation Level: Disoriented to;Time;Situation (Could not tell me her birth year but knew month/day)   Memory: Decreased short-term memory Following Commands: Follows one step commands with increased time Safety/Judgement: Decreased awareness of safety;Decreased awareness of deficits   Problem Solving: Slow processing;Decreased initiation;Requires verbal cues;Difficulty sequencing      General Comments      Exercises        Assessment/Plan    PT Assessment Patient needs continued PT services;All further PT needs can be met in the next venue of care  PT Diagnosis Difficulty walking;Generalized weakness   PT Problem List Decreased strength;Decreased range of motion;Decreased balance;Decreased mobility;Decreased activity tolerance;Decreased knowledge of use of DME;Decreased safety awareness;Decreased knowledge of precautions;Decreased cognition  PT Treatment Interventions DME instruction;Gait training;Stair training;Functional mobility training;Therapeutic activities;Therapeutic exercise;Neuromuscular re-education;Patient/family education   PT Goals (Current goals can be found in the Care Plan section) Acute Rehab PT  Goals Patient Stated Goal: to go home PT Goal Formulation: With patient/family Time For Goal Achievement: 09/26/15 Potential to Achieve Goals: Good    Frequency Min 4X/week   Barriers to discharge        Co-evaluation               End of Session Equipment Utilized During Treatment: Gait belt Activity Tolerance: Patient tolerated treatment well Patient left: in chair;with chair alarm set;with call bell/phone within reach;with family/visitor present Nurse Communication: Mobility status         Time: 4037-5436 PT Time Calculation (min) (ACUTE ONLY): 27 min   Charges:   PT Evaluation $PT Eval Moderate Complexity: 1 Procedure PT Treatments $Gait Training: 8-22 mins   PT G Codes:        Rolinda Roan 2015/10/05, 1:07 PM   Rolinda Roan, PT, DPT Acute Rehabilitation Services Pager: [phone]

## 2015-09-13 ENCOUNTER — Inpatient Hospital Stay (HOSPITAL_COMMUNITY)
Admission: AD | Admit: 2015-09-13 | Discharge: 2015-09-25 | DRG: 057 | Disposition: A | Discharge status: Skilled Nursing Facility | Payer: Medicare Other | Source: Intra-hospital | Attending: Physical Medicine & Rehabilitation | Admitting: Physical Medicine & Rehabilitation

## 2015-09-13 ENCOUNTER — Institutional Professional Consult (permissible substitution): Payer: Medicare Other | Admitting: Pulmonary Disease

## 2015-09-13 DIAGNOSIS — I69398 Other sequelae of cerebral infarction: Secondary | ICD-10-CM | POA: Diagnosis not present

## 2015-09-13 DIAGNOSIS — J45909 Unspecified asthma, uncomplicated: Secondary | ICD-10-CM | POA: Diagnosis present

## 2015-09-13 DIAGNOSIS — M6281 Muscle weakness (generalized): Secondary | ICD-10-CM | POA: Diagnosis not present

## 2015-09-13 DIAGNOSIS — I69351 Hemiplegia and hemiparesis following cerebral infarction affecting right dominant side: Secondary | ICD-10-CM | POA: Diagnosis not present

## 2015-09-13 DIAGNOSIS — R2681 Unsteadiness on feet: Secondary | ICD-10-CM | POA: Diagnosis not present

## 2015-09-13 DIAGNOSIS — I872 Venous insufficiency (chronic) (peripheral): Secondary | ICD-10-CM | POA: Diagnosis present

## 2015-09-13 DIAGNOSIS — R41841 Cognitive communication deficit: Secondary | ICD-10-CM | POA: Diagnosis not present

## 2015-09-13 DIAGNOSIS — H5440 Blindness, one eye, unspecified eye: Secondary | ICD-10-CM | POA: Diagnosis not present

## 2015-09-13 DIAGNOSIS — J4550 Severe persistent asthma, uncomplicated: Secondary | ICD-10-CM | POA: Diagnosis not present

## 2015-09-13 DIAGNOSIS — Z9849 Cataract extraction status, unspecified eye: Secondary | ICD-10-CM | POA: Diagnosis not present

## 2015-09-13 DIAGNOSIS — R269 Unspecified abnormalities of gait and mobility: Secondary | ICD-10-CM

## 2015-09-13 DIAGNOSIS — I69354 Hemiplegia and hemiparesis following cerebral infarction affecting left non-dominant side: Principal | ICD-10-CM

## 2015-09-13 DIAGNOSIS — Z825 Family history of asthma and other chronic lower respiratory diseases: Secondary | ICD-10-CM

## 2015-09-13 DIAGNOSIS — Z79899 Other long term (current) drug therapy: Secondary | ICD-10-CM | POA: Diagnosis not present

## 2015-09-13 DIAGNOSIS — E785 Hyperlipidemia, unspecified: Secondary | ICD-10-CM | POA: Diagnosis present

## 2015-09-13 DIAGNOSIS — R944 Abnormal results of kidney function studies: Secondary | ICD-10-CM | POA: Insufficient documentation

## 2015-09-13 DIAGNOSIS — K5900 Constipation, unspecified: Secondary | ICD-10-CM | POA: Diagnosis present

## 2015-09-13 DIAGNOSIS — I639 Cerebral infarction, unspecified: Secondary | ICD-10-CM | POA: Diagnosis not present

## 2015-09-13 DIAGNOSIS — Z7902 Long term (current) use of antithrombotics/antiplatelets: Secondary | ICD-10-CM | POA: Diagnosis not present

## 2015-09-13 DIAGNOSIS — G8194 Hemiplegia, unspecified affecting left nondominant side: Secondary | ICD-10-CM | POA: Insufficient documentation

## 2015-09-13 DIAGNOSIS — I6930 Unspecified sequelae of cerebral infarction: Secondary | ICD-10-CM | POA: Diagnosis not present

## 2015-09-13 DIAGNOSIS — Z7951 Long term (current) use of inhaled steroids: Secondary | ICD-10-CM

## 2015-09-13 DIAGNOSIS — Z5189 Encounter for other specified aftercare: Secondary | ICD-10-CM | POA: Diagnosis not present

## 2015-09-13 DIAGNOSIS — Z7952 Long term (current) use of systemic steroids: Secondary | ICD-10-CM | POA: Diagnosis not present

## 2015-09-13 DIAGNOSIS — I6381 Other cerebral infarction due to occlusion or stenosis of small artery: Secondary | ICD-10-CM | POA: Diagnosis present

## 2015-09-13 LAB — HEMOGLOBIN A1C
Hgb A1c MFr Bld: 5.7 % — ABNORMAL HIGH (ref 4.8–5.6)
MEAN PLASMA GLUCOSE: 117 mg/dL

## 2015-09-13 LAB — CREATININE, SERUM
Creatinine, Ser: 1.19 mg/dL — ABNORMAL HIGH (ref 0.44–1.00)
GFR, EST AFRICAN AMERICAN: 45 mL/min — AB (ref >60)
GFR, EST NON AFRICAN AMERICAN: 39 mL/min — AB (ref >60)

## 2015-09-13 LAB — CBC
HEMATOCRIT: 41.7 % (ref 36.0–46.0)
Hemoglobin: 13.5 g/dL (ref 12.0–15.0)
MCH: 28.5 pg (ref 26.0–34.0)
MCHC: 32.4 g/dL (ref 30.0–36.0)
MCV: 88.2 fL (ref 78.0–100.0)
PLATELETS: 229 10*3/uL (ref 150–400)
RBC: 4.73 MIL/uL (ref 3.87–5.11)
RDW: 15.7 % — AB (ref 11.5–15.5)
WBC: 6.9 10*3/uL (ref 4.0–10.5)

## 2015-09-13 MED ORDER — PRAVASTATIN SODIUM 20 MG PO TABS
20.0000 mg | ORAL_TABLET | Freq: Every day | ORAL | Status: DC
  Administered 2015-09-13 – 2015-09-24 (×12): 20 mg via ORAL
  Filled 2015-09-13 (×12): qty 1

## 2015-09-13 MED ORDER — ONDANSETRON HCL 4 MG/2ML IJ SOLN: 4.0000 mg | Freq: Four times a day (QID) | INTRAMUSCULAR | Status: DC | PRN

## 2015-09-13 MED ORDER — ACETAMINOPHEN 650 MG RE SUPP: 650.0000 mg | Freq: Four times a day (QID) | RECTAL | Status: DC | PRN

## 2015-09-13 MED ORDER — ASPIRIN 325 MG PO TABS
325.0000 mg | ORAL_TABLET | Freq: Every day | ORAL | Status: DC
  Administered 2015-09-14 – 2015-09-24 (×11): 325 mg via ORAL
  Filled 2015-09-13 (×11): qty 1

## 2015-09-13 MED ORDER — ONDANSETRON HCL 4 MG PO TABS
4.0000 mg | ORAL_TABLET | Freq: Four times a day (QID) | ORAL | Status: DC | PRN
Start: 2015-09-13 — End: 2015-09-25

## 2015-09-13 MED ORDER — MOMETASONE FURO-FORMOTEROL FUM 100-5 MCG/ACT IN AERO
2.0000 | INHALATION_SPRAY | Freq: Two times a day (BID) | RESPIRATORY_TRACT | Status: DC
  Administered 2015-09-14 – 2015-09-25 (×19): 2 via RESPIRATORY_TRACT
  Filled 2015-09-13 (×3): qty 8.8

## 2015-09-13 MED ORDER — PREDNISONE 5 MG PO TABS
5.0000 mg | ORAL_TABLET | Freq: Every day | ORAL | Status: DC
  Administered 2015-09-14 – 2015-09-25 (×12): 5 mg via ORAL
  Filled 2015-09-13 (×12): qty 1

## 2015-09-13 MED ORDER — CLOPIDOGREL BISULFATE 75 MG PO TABS
75.0000 mg | ORAL_TABLET | Freq: Every day | ORAL | Status: DC
Start: 2015-09-14 — End: 2015-09-25
  Administered 2015-09-14 – 2015-09-25 (×12): 75 mg via ORAL
  Filled 2015-09-13 (×12): qty 1

## 2015-09-13 MED ORDER — ACETAMINOPHEN 325 MG PO TABS: 650.0000 mg | ORAL_TABLET | Freq: Four times a day (QID) | ORAL | Status: DC | PRN

## 2015-09-13 MED ORDER — ENOXAPARIN SODIUM 40 MG/0.4ML ~~LOC~~ SOLN
40.0000 mg | Freq: Every day | SUBCUTANEOUS | Status: DC
  Administered 2015-09-14 – 2015-09-25 (×12): 40 mg via SUBCUTANEOUS
  Filled 2015-09-13 (×12): qty 0.4

## 2015-09-13 MED ORDER — SORBITOL 70 % SOLN
30.0000 mL | Freq: Every day | Status: DC | PRN
Start: 2015-09-13 — End: 2015-09-25
  Administered 2015-09-18 – 2015-09-23 (×2): 30 mL via ORAL
  Filled 2015-09-13 (×3): qty 30

## 2015-09-13 MED ORDER — ENOXAPARIN SODIUM 40 MG/0.4ML ~~LOC~~ SOLN: 40.0000 mg | SUBCUTANEOUS | Status: DC

## 2015-09-13 MED ORDER — ASPIRIN 300 MG RE SUPP: 300.0000 mg | Freq: Every day | RECTAL | Status: DC

## 2015-09-13 NOTE — Progress Notes (Signed)
Patient arrived CIR with husband at bedside. Given information packet, and discussed safety plan/agreement- patient and husband verbalized understanding. Patient oriented to room, and call bell-demonstrated understanding. Will continue to monitor patient. [place]

## 2015-09-13 NOTE — Discharge Summary (Signed)
Physician Discharge Summary  Darlene Murray [id number] DOB: 1922/02/16 DOA: 09/11/2015  PCP: Gennette Pac, MD  Admit date: 09/11/2015 Discharge date: 09/13/2015  Time spent: 25 minutes  Recommendations for Outpatient Follow-up:  Discharge to inpatient rehabilitation  Discharge Diagnoses:  Principal Problem:   Right-sided lacunar infarction [facility])   Active Problems:   Hyperlipidemia   Left-sided weakness   History of CVA with residual deficit   Asthma, moderate persistent   Discharge Condition: Fair  Diet recommendation: heart healthy  CODE STATUS: Full code  There were no vitals filed for this visit.  History of present illness:  Please refer to admission H&P for details, in brief,82 year old female, legally blind, history of multiple strokes (with residual right-sided weakness) who presented to the ED with acute onset of left-sided weakness. She had left facial droop and slurred speech and per family was not acting herself. Patient was not a TPA candidate as she was out of therapeutic window for TPA. She was admitted to hospitalist service for stroke workup.  Hospital Course:  Acute stroke -Right thalamus and right posterior limb internal capsule and left occipital lobe lacunar infarcts seen on MRI/MRA. -Patient placed on full dose aspirin and Plavix. (Was on 81 mg aspirin and 75 mg daily Plavix prior to admission). -Seen by neurology and since patient had 2-D echo done on 04/2015 recommended he does not need to be repeated. During her hospitalization 2 months back for stroke she was evaluated by cardiology and recommend no role for TEE/loop recorder as she was not a candidate for anticoagulation due to fall risk. -Recent LDL of 53 and A1c of 5.8. No further stroke workup recommended by stroke team and recommends aggressive risk factor management. (Can resume home dose aspirin and Plavix upon discharge). Continue statin. Seen by PT and recommended CIR, patient  accepted. Follow-up with neurologist Dr. Krista Blue as outpatient.  Essential hypertension Stable. Not on any medications.  History of asthma Asymptomatic. Continue low-dose prednisone and dulera.     Family Communication: None at bedside. Disposition Plan: Inpatient rehabilitation   Consultants:  Neurology  Procedures:  MRI brain/MRA head and neck  Antibiotics:  None  Discharge Exam: Filed Vitals:   09/13/15 0545 09/13/15 1042  BP: 113/67 130/63  Pulse: 63 58  Temp: 97.4 F (36.3 C) 98.6 F (37 C)  Resp: 18 18     General: Elderly female not in distress  HEENT:  moist mucosa, no facial droop  Chest: Clear bilaterally  CVS: Normal S1 and S2, no murmurs  GI: Soft, nondistended, nontender  Musculoskeletal: Warm, no edema  CNS: Alert and Oriented, Discharge Instructions    Current Discharge Medication List    CONTINUE these medications which have NOT CHANGED   Details  albuterol (PROVENTIL HFA;VENTOLIN HFA) 108 (90 Base) MCG/ACT inhaler Inhale 2 puffs into the lungs every 6 (six) hours as needed for wheezing or shortness of breath. Qty: 1 Inhaler, Refills: 2    aspirin 81 MG tablet Take 81 mg by mouth daily.    calcium carbonate (OS-CAL) 600 MG TABS Take 600 mg by mouth daily.      cholecalciferol (VITAMIN D) 1000 UNITS tablet Take 1,000 Units by mouth daily.    clopidogrel (PLAVIX) 75 MG tablet Take 1 tablet (75 mg total) by mouth daily. Qty: 30 tablet, Refills: 11    mometasone-formoterol (DULERA) 100-5 MCG/ACT AERO Inhale 2 puffs into the lungs 2 (two) times daily. Qty: 3 Inhaler, Refills: 1    Multiple Vitamin (MULTIVITAMIN) tablet Take 1 tablet  by mouth daily.    pravastatin (PRAVACHOL) 20 MG tablet Take 1 tablet (20 mg total) by mouth daily at 6 PM. Qty: 90 tablet, Refills: 3    predniSONE (DELTASONE) 5 MG tablet Take 1 tablet (5 mg total) by mouth daily. Qty: 30 tablet, Refills: 5       No Known Allergies Follow-up Information     Please follow up.   Why:  MD at inpt rehab       The results of significant diagnostics from this hospitalization (including imaging, microbiology, ancillary and laboratory) are listed below for reference.    Significant Diagnostic Studies: Mr Virgel Paling Wo Contrast  09/11/2015  CLINICAL DATA:  82 year old female with slurred speech and left-sided weakness with left facial droop. History of stroke. Subsequent encounter. EXAM: MRI HEAD WITHOUT AND WITH CONTRAST AND MRA HEAD WITHOUT CONTRAST AND MRI NECK WITHOUT AND WITH CONTRAST TECHNIQUE: Multiplanar, multiecho pulse sequences of the brain and surrounding structures were obtained without and with intravenous contrast. Angiographic images of the head were obtained using MRA technique without contrast. Multiplanar, multiecho pulse sequences of the neck and surrounding structures were obtained without and with intravenous contrast. CONTRAST:  59mL MULTIHANCE GADOBENATE DIMEGLUMINE 529 MG/ML IV SOLN COMPARISON:  09/11/2015 head CT.  08/02/2015 brain MR. FINDINGS: MRI HEAD FINDINGS Exam is motion degraded. Acute nonhemorrhagic infarct right thalamus and posterior limb right internal capsule. Question tiny acute medial left occipital lobe infarct. Remote bilateral cerebellar infarcts. Remote infarct right middle cerebellar peduncle. Remote thalamic infarcts. Mild small vessel disease changes. Global atrophy without hydrocephalus. No intracranial hemorrhage. No intracranial enhancing lesion. Longstanding complete opacification right sphenoid sinus without expansion. Inspissated material suspected. Mucosal thickening remainder of paranasal sinuses with opacification posterior left ethmoid sinus air cells. Elongated right globe.  Post lens replacement. Partially empty sella. Mild spinal stenosis upper cervical spine. Cervical medullary junction unremarkable. MRA HEAD FINDINGS Exam is motion degraded. Mild narrowing pre cavernous and supraclinoid segment of the  internal carotid artery bilaterally with ectasia of cavernous segment bilaterally. Fetal contribution to the right posterior cerebral artery. Mild narrowing A1 segment anterior cerebral artery bilaterally. Moderate narrowing A2 segment anterior cerebral artery bilaterally. Moderate to marked narrowing middle cerebral artery branches bilaterally. Marked narrowing distal left vertebral artery. Moderate to marked narrowing distal right vertebral artery. Mild narrowing basilar artery. Nonvisualized posterior inferior cerebellar arteries, anterior inferior cerebellar arteries and superior cerebellar arteries. Mark narrowing posterior cerebral arteries bilaterally more notable on the left. Prominent basilar tip may reflect atherosclerotic changes rather than saccular aneurysm. MRI NECK FINDINGS Exam is motion degraded. Ectatic common carotid arteries bilaterally. Loss of signal right common carotid artery may be related to artifact rather than true stenosis. Appearance of hemodynamically significant stenosis proximal internal carotid artery bilaterally may be accentuated by motion artifact. Mild to moderate narrowing proximal 3 cm internal carotid artery bilaterally. Poor delineation majority of the left vertebral artery. Moderate to marked narrowing proximal right vertebral artery. Mild to moderate narrowing proximal right subclavian artery. Moderate to marked tandem stenosis left subclavian artery. IMPRESSION: MRI HEAD Exam is motion degraded. Acute nonhemorrhagic infarct right thalamus and posterior limb right internal capsule. Question tiny acute medial left occipital lobe infarct. Remote bilateral cerebellar infarcts. Remote infarct right middle cerebellar peduncle. Remote thalamic infarcts. Mild small vessel disease changes. Global atrophy without hydrocephalus. No intracranial hemorrhage. No intracranial enhancing lesion. Longstanding complete opacification right sphenoid sinus without expansion. Inspissated  material suspected. Mucosal thickening remainder of paranasal sinuses with opacification posterior left ethmoid sinus  air cells. Elongated right globe.  Post lens replacement. MRA HEAD Exam is motion degraded. Mild narrowing pre cavernous and supraclinoid segment of the internal carotid artery bilaterally with ectasia of cavernous segment bilaterally. Mild narrowing A1 segment anterior cerebral artery bilaterally. Moderate narrowing A2 segment anterior cerebral artery bilaterally. Moderate to marked narrowing middle cerebral artery branches bilaterally. Marked narrowing distal left vertebral artery. Moderate to marked narrowing distal right vertebral artery. Mild narrowing basilar artery. Nonvisualized posterior inferior cerebellar arteries, anterior inferior cerebellar arteries and superior cerebellar arteries. Mark narrowing posterior cerebral arteries bilaterally more notable on the left. Prominent basilar tip may reflect atherosclerotic changes rather than saccular aneurysm. MRI NECK Exam is motion degraded. Ectatic common carotid arteries bilaterally. Loss of signal right common carotid artery may be related to artifact rather than true stenosis. Appearance of hemodynamically significant stenosis proximal internal carotid artery bilaterally may be accentuated by motion artifact. Mild to moderate narrowing proximal 3 cm internal carotid artery bilaterally. Poor delineation majority of the left vertebral artery. Moderate to marked narrowing proximal right vertebral artery. Mild to moderate narrowing proximal right subclavian artery. Moderate to marked tandem stenosis left subclavian artery. Electronically Signed   By: Genia Del M.D.   On: 09/11/2015 10:33   Mr Angiogram Neck W Wo Contrast  09/11/2015  CLINICAL DATA:  82 year old female with slurred speech and left-sided weakness with left facial droop. History of stroke. Subsequent encounter. EXAM: MRI HEAD WITHOUT AND WITH CONTRAST AND MRA HEAD WITHOUT  CONTRAST AND MRI NECK WITHOUT AND WITH CONTRAST TECHNIQUE: Multiplanar, multiecho pulse sequences of the brain and surrounding structures were obtained without and with intravenous contrast. Angiographic images of the head were obtained using MRA technique without contrast. Multiplanar, multiecho pulse sequences of the neck and surrounding structures were obtained without and with intravenous contrast. CONTRAST:  49mL MULTIHANCE GADOBENATE DIMEGLUMINE 529 MG/ML IV SOLN COMPARISON:  09/11/2015 head CT.  08/02/2015 brain MR. FINDINGS: MRI HEAD FINDINGS Exam is motion degraded. Acute nonhemorrhagic infarct right thalamus and posterior limb right internal capsule. Question tiny acute medial left occipital lobe infarct. Remote bilateral cerebellar infarcts. Remote infarct right middle cerebellar peduncle. Remote thalamic infarcts. Mild small vessel disease changes. Global atrophy without hydrocephalus. No intracranial hemorrhage. No intracranial enhancing lesion. Longstanding complete opacification right sphenoid sinus without expansion. Inspissated material suspected. Mucosal thickening remainder of paranasal sinuses with opacification posterior left ethmoid sinus air cells. Elongated right globe.  Post lens replacement. Partially empty sella. Mild spinal stenosis upper cervical spine. Cervical medullary junction unremarkable. MRA HEAD FINDINGS Exam is motion degraded. Mild narrowing pre cavernous and supraclinoid segment of the internal carotid artery bilaterally with ectasia of cavernous segment bilaterally. Fetal contribution to the right posterior cerebral artery. Mild narrowing A1 segment anterior cerebral artery bilaterally. Moderate narrowing A2 segment anterior cerebral artery bilaterally. Moderate to marked narrowing middle cerebral artery branches bilaterally. Marked narrowing distal left vertebral artery. Moderate to marked narrowing distal right vertebral artery. Mild narrowing basilar artery. Nonvisualized  posterior inferior cerebellar arteries, anterior inferior cerebellar arteries and superior cerebellar arteries. Mark narrowing posterior cerebral arteries bilaterally more notable on the left. Prominent basilar tip may reflect atherosclerotic changes rather than saccular aneurysm. MRI NECK FINDINGS Exam is motion degraded. Ectatic common carotid arteries bilaterally. Loss of signal right common carotid artery may be related to artifact rather than true stenosis. Appearance of hemodynamically significant stenosis proximal internal carotid artery bilaterally may be accentuated by motion artifact. Mild to moderate narrowing proximal 3 cm internal carotid artery bilaterally. Poor delineation  majority of the left vertebral artery. Moderate to marked narrowing proximal right vertebral artery. Mild to moderate narrowing proximal right subclavian artery. Moderate to marked tandem stenosis left subclavian artery. IMPRESSION: MRI HEAD Exam is motion degraded. Acute nonhemorrhagic infarct right thalamus and posterior limb right internal capsule. Question tiny acute medial left occipital lobe infarct. Remote bilateral cerebellar infarcts. Remote infarct right middle cerebellar peduncle. Remote thalamic infarcts. Mild small vessel disease changes. Global atrophy without hydrocephalus. No intracranial hemorrhage. No intracranial enhancing lesion. Longstanding complete opacification right sphenoid sinus without expansion. Inspissated material suspected. Mucosal thickening remainder of paranasal sinuses with opacification posterior left ethmoid sinus air cells. Elongated right globe.  Post lens replacement. MRA HEAD Exam is motion degraded. Mild narrowing pre cavernous and supraclinoid segment of the internal carotid artery bilaterally with ectasia of cavernous segment bilaterally. Mild narrowing A1 segment anterior cerebral artery bilaterally. Moderate narrowing A2 segment anterior cerebral artery bilaterally. Moderate to marked  narrowing middle cerebral artery branches bilaterally. Marked narrowing distal left vertebral artery. Moderate to marked narrowing distal right vertebral artery. Mild narrowing basilar artery. Nonvisualized posterior inferior cerebellar arteries, anterior inferior cerebellar arteries and superior cerebellar arteries. Mark narrowing posterior cerebral arteries bilaterally more notable on the left. Prominent basilar tip may reflect atherosclerotic changes rather than saccular aneurysm. MRI NECK Exam is motion degraded. Ectatic common carotid arteries bilaterally. Loss of signal right common carotid artery may be related to artifact rather than true stenosis. Appearance of hemodynamically significant stenosis proximal internal carotid artery bilaterally may be accentuated by motion artifact. Mild to moderate narrowing proximal 3 cm internal carotid artery bilaterally. Poor delineation majority of the left vertebral artery. Moderate to marked narrowing proximal right vertebral artery. Mild to moderate narrowing proximal right subclavian artery. Moderate to marked tandem stenosis left subclavian artery. Electronically Signed   By: Genia Del M.D.   On: 09/11/2015 10:33   Mr Jeri Cos [id number] Contrast  09/11/2015  CLINICAL DATA:  82 year old female with slurred speech and left-sided weakness with left facial droop. History of stroke. Subsequent encounter. EXAM: MRI HEAD WITHOUT AND WITH CONTRAST AND MRA HEAD WITHOUT CONTRAST AND MRI NECK WITHOUT AND WITH CONTRAST TECHNIQUE: Multiplanar, multiecho pulse sequences of the brain and surrounding structures were obtained without and with intravenous contrast. Angiographic images of the head were obtained using MRA technique without contrast. Multiplanar, multiecho pulse sequences of the neck and surrounding structures were obtained without and with intravenous contrast. CONTRAST:  29mL MULTIHANCE GADOBENATE DIMEGLUMINE 529 MG/ML IV SOLN COMPARISON:  09/11/2015 head CT.   08/02/2015 brain MR. FINDINGS: MRI HEAD FINDINGS Exam is motion degraded. Acute nonhemorrhagic infarct right thalamus and posterior limb right internal capsule. Question tiny acute medial left occipital lobe infarct. Remote bilateral cerebellar infarcts. Remote infarct right middle cerebellar peduncle. Remote thalamic infarcts. Mild small vessel disease changes. Global atrophy without hydrocephalus. No intracranial hemorrhage. No intracranial enhancing lesion. Longstanding complete opacification right sphenoid sinus without expansion. Inspissated material suspected. Mucosal thickening remainder of paranasal sinuses with opacification posterior left ethmoid sinus air cells. Elongated right globe.  Post lens replacement. Partially empty sella. Mild spinal stenosis upper cervical spine. Cervical medullary junction unremarkable. MRA HEAD FINDINGS Exam is motion degraded. Mild narrowing pre cavernous and supraclinoid segment of the internal carotid artery bilaterally with ectasia of cavernous segment bilaterally. Fetal contribution to the right posterior cerebral artery. Mild narrowing A1 segment anterior cerebral artery bilaterally. Moderate narrowing A2 segment anterior cerebral artery bilaterally. Moderate to marked narrowing middle cerebral artery branches bilaterally. Marked narrowing distal  left vertebral artery. Moderate to marked narrowing distal right vertebral artery. Mild narrowing basilar artery. Nonvisualized posterior inferior cerebellar arteries, anterior inferior cerebellar arteries and superior cerebellar arteries. Mark narrowing posterior cerebral arteries bilaterally more notable on the left. Prominent basilar tip may reflect atherosclerotic changes rather than saccular aneurysm. MRI NECK FINDINGS Exam is motion degraded. Ectatic common carotid arteries bilaterally. Loss of signal right common carotid artery may be related to artifact rather than true stenosis. Appearance of hemodynamically significant  stenosis proximal internal carotid artery bilaterally may be accentuated by motion artifact. Mild to moderate narrowing proximal 3 cm internal carotid artery bilaterally. Poor delineation majority of the left vertebral artery. Moderate to marked narrowing proximal right vertebral artery. Mild to moderate narrowing proximal right subclavian artery. Moderate to marked tandem stenosis left subclavian artery. IMPRESSION: MRI HEAD Exam is motion degraded. Acute nonhemorrhagic infarct right thalamus and posterior limb right internal capsule. Question tiny acute medial left occipital lobe infarct. Remote bilateral cerebellar infarcts. Remote infarct right middle cerebellar peduncle. Remote thalamic infarcts. Mild small vessel disease changes. Global atrophy without hydrocephalus. No intracranial hemorrhage. No intracranial enhancing lesion. Longstanding complete opacification right sphenoid sinus without expansion. Inspissated material suspected. Mucosal thickening remainder of paranasal sinuses with opacification posterior left ethmoid sinus air cells. Elongated right globe.  Post lens replacement. MRA HEAD Exam is motion degraded. Mild narrowing pre cavernous and supraclinoid segment of the internal carotid artery bilaterally with ectasia of cavernous segment bilaterally. Mild narrowing A1 segment anterior cerebral artery bilaterally. Moderate narrowing A2 segment anterior cerebral artery bilaterally. Moderate to marked narrowing middle cerebral artery branches bilaterally. Marked narrowing distal left vertebral artery. Moderate to marked narrowing distal right vertebral artery. Mild narrowing basilar artery. Nonvisualized posterior inferior cerebellar arteries, anterior inferior cerebellar arteries and superior cerebellar arteries. Mark narrowing posterior cerebral arteries bilaterally more notable on the left. Prominent basilar tip may reflect atherosclerotic changes rather than saccular aneurysm. MRI NECK Exam is  motion degraded. Ectatic common carotid arteries bilaterally. Loss of signal right common carotid artery may be related to artifact rather than true stenosis. Appearance of hemodynamically significant stenosis proximal internal carotid artery bilaterally may be accentuated by motion artifact. Mild to moderate narrowing proximal 3 cm internal carotid artery bilaterally. Poor delineation majority of the left vertebral artery. Moderate to marked narrowing proximal right vertebral artery. Mild to moderate narrowing proximal right subclavian artery. Moderate to marked tandem stenosis left subclavian artery. Electronically Signed   By: Genia Del M.D.   On: 09/11/2015 10:33   Ct Head Code Stroke W/o Cm  09/11/2015  CLINICAL DATA:  82 year old female with slurred speech, left-sided weakness and left facial droop. History stroke. Initial encounter. EXAM: CT HEAD WITHOUT CONTRAST TECHNIQUE: Contiguous axial images were obtained from the base of the skull through the vertex without intravenous contrast. COMPARISON:  08/02/2015 brain MR and head CT. FINDINGS: No intracranial hemorrhage. Several prior remote infarcts with appearance of evolution/progression of right thalamic and posterior limb right internal capsule infarct. Moderate small vessel disease changes. Global atrophy without hydrocephalus. No intracranial mass lesion noted on this unenhanced exam. Exophthalmos. Opacification right sphenoid sinus. Mild mucosal thickening maxillary sinuses and ethmoid sinus air cells greater on the left. IMPRESSION: No intracranial hemorrhage. Several prior remote infarcts with appearance of evolution/progression of right thalamic and posterior limb right internal capsule infarct. Moderate small vessel disease changes. Global atrophy without hydrocephalus. Opacification right sphenoid sinus. Mild mucosal thickening maxillary sinuses and ethmoid sinus air cells greater on the left. These results were  called by telephone at the time  of interpretation on 09/11/2015 at 6:39 am to Dr. Everlene Balls , who verbally acknowledged these results. Electronically Signed   By: Genia Del M.D.   On: 09/11/2015 06:41    Microbiology: No results found for this or any previous visit (from the past 240 hour(s)).   Labs: Basic Metabolic Panel:  Recent Labs Lab 09/11/15 0544 09/11/15 0552  NA 143 143  K 4.1 4.1  CL 108 104  CO2 27  --   GLUCOSE 80 75  BUN 16 21*  CREATININE 1.11* 1.00  CALCIUM 8.8*  --    Liver Function Tests:  Recent Labs Lab 09/11/15 0544  AST 20  ALT 17  ALKPHOS 43  BILITOT 0.7  PROT 5.8*  ALBUMIN 3.0*   No results for input(s): LIPASE, AMYLASE in the last 168 hours. No results for input(s): AMMONIA in the last 168 hours. CBC:  Recent Labs Lab 09/11/15 0544 09/11/15 0552  WBC 7.7  --   NEUTROABS 3.4  --   HGB 13.3 14.3  HCT 41.2 42.0  MCV 88.4  --   PLT 227  --    Cardiac Enzymes: No results for input(s): CKTOTAL, CKMB, CKMBINDEX, TROPONINI in the last 168 hours. BNP: BNP (last 3 results)  Recent Labs  12/16/14 1025  BNP 14.1    ProBNP (last 3 results) No results for input(s): PROBNP in the last 8760 hours.  CBG:  Recent Labs Lab 09/11/15 0544  GLUCAP 78       Signed:  Louellen Molder MD.  [facility] 09/13/2015, 11:06 AM

## 2015-09-13 NOTE — Progress Notes (Signed)
Rehab admissions - I met with patient, her husband, and their eldest son at the bedside today.  All are in favor of inpatient rehab admission.  Patient known to me from previous inpatient rehab admission post CVA.  I do have rehab bed available today.  I will call MD to check for medical readiness today.  Call me for questions.  #[phone]

## 2015-09-13 NOTE — Progress Notes (Signed)
Darlene Diones, RN Rehab Admission Coordinator Signed Physical Medicine and Rehabilitation PMR Pre-admission 09/13/2015 11:07 AM  Related encounter: ED to Hosp-Admission (Current) from 09/11/2015 in [place] Collapse All   PMR Admission Coordinator Pre-Admission Assessment  Patient: Darlene Murray is an 82 y.o., female MRN: [id number] DOB: 05/22/1922 Height: 5\' 5"  (165.1 cm) Weight: 160 lbs Insurance Information HMO: No PPO: PCP: IPA: 80/20: OTHER:  PRIMARY: Medicare A/B Policy#: [id number] Subscriber: Darlene Murray CM Name: Phone#: Fax#:  Pre-Cert#: Employer: Retired Benefits: Phone #: Name: Checked in [place]. Date: 03/07/89 Deduct: $1316 Out of Pocket Max: None Life Max: unlimited CIR: 100% SNF: 100 days Outpatient: 80% Co-Pay: 20% Home Health: 100% Co-Pay: none DME: 80% Co-Pay: 20% Providers: patient's choice  SECONDARY: [facility] Policy#: [id number] Subscriber: Darlene Murray CM Name: Phone#: Fax#:  Pre-Cert#: Employer:  Benefits: Phone #: Name:  Eff. Date: Deduct: Out of Pocket Max: Life Max:  CIR: SNF:  Outpatient: Co-Pay:  Home Health: Co-Pay:  DME: Co-Pay:   Emergency Contact Information Contact Information    Name Relation Home Work Mobile   Murray,[name] [phone]  [phone]   [place] Son [phone]       Current Medical History  Patient Admitting Diagnosis: R PLIC, L occipital, R thalamic infarcts  History of Present Illness: A 82 y.o. right handed female with history asthma maintained on prednisone, venous  insufficiency and previous CVA with right-sided residual weakness and received inpatient rehabilitation services October 2016. Patient lives with husband. She was able to ambulate short distances with a walker but primarily stayed in a wheelchair and was receiving home health therapies. Husband has been assisting minimally for showering toileting and dressing. One level home with level entry. Presented 09/11/2015 with left-sided weakness and slurred speech. MRI of the brain shows acute nonhemorrhagic infarct right thalamus and posterior limb right internal capsule. Tiny acute medial left occipital lobe infarct. Remote bilateral cerebellar infarcts. MRA MRI of the head and neck showed moderate to marked narrowing proximal right vertebral artery as well as right subclavian artery and left subclavian artery. Echocardiogram pending. Patient did not receive TPA. Neurology consulted and currently maintained on aspirin and Plavix for CVA prophylaxis. Subcutaneous Lovenox for DVT prophylaxis. Tolerating a regular diet. Patient to be admitted for comprehensive inpatient rehabilitation program.   Total: 11=NIH  Past Medical History  Past Medical History  Diagnosis Date  . Blindness   . Asthmatic bronchitis   . Asthma   . Fibrocystic breast disease   . Allergic rhinitis   . Hyperlipidemia   . Venous insufficiency     lower extremities  . HNP (herniated nucleus pulposus), lumbar     L4-L5  . Left ventricular hypertrophy 2008    mild  . Antibiotic-associated diarrhea   . Stroke (cerebrum) [facility])     Family History  family history includes Alzheimer's disease in her sister; Asthma in her son; Heart attack in her sister.  Prior Rehab/Hospitalizations: [facility] PT and aide 3 X a week. Patient has had several CVAs. Has been to CIR 10/16 most recently.  Has the patient had major surgery during 100 days prior to admission? No  Current Medications   Current  facility-administered medications:  . stroke: mapping our early stages of recovery book, , Does not apply, Once, Darlene Dickens, MD . acetaminophen (TYLENOL) tablet 650 mg, 650 mg, Oral, Q6H PRN **OR** acetaminophen (TYLENOL) suppository 650 mg, 650 mg, Rectal, Q6H PRN, [name]  Darlene Memos, MD . aspirin suppository 300 mg, 300 mg, Rectal, Daily **OR** aspirin tablet 325 mg, 325 mg, Oral, Daily, Darlene Dickens, MD, 325 mg at 09/13/15 0911 . clopidogrel (PLAVIX) tablet 75 mg, 75 mg, Oral, Daily, Darlene Dickens, MD, 75 mg at 09/13/15 0911 . enoxaparin (LOVENOX) injection 40 mg, 40 mg, Subcutaneous, Daily, Darlene Dickens, MD, 40 mg at 09/13/15 0911 . mometasone-formoterol (DULERA) 100-5 MCG/ACT inhaler 2 puff, 2 puff, Inhalation, BID, Darlene Dickens, MD, 2 puff at 09/13/15 1139 . ondansetron (ZOFRAN) tablet 4 mg, 4 mg, Oral, Q6H PRN **OR** ondansetron (ZOFRAN) injection 4 mg, 4 mg, Intravenous, Q6H PRN, Darlene Dickens, MD . pravastatin (PRAVACHOL) tablet 20 mg, 20 mg, Oral, q1800, Darlene Dickens, MD, 20 mg at 09/12/15 1708 . predniSONE (DELTASONE) tablet 5 mg, 5 mg, Oral, Daily, Darlene Dickens, MD, 5 mg at 09/13/15 0911  Patients Current Diet: Diet regular Room service appropriate?: Yes; Fluid consistency:: Thin  Precautions / Restrictions Precautions Precautions: Fall Restrictions Weight Bearing Restrictions: No   Has the patient had 2 or more falls or a fall with injury in the past year?Yes. Has had 3 falls with no injury  Prior Activity Level Limited Community (1-2x/wk): Goes out 1-2 X a week, Not driving.  Home Assistive Devices / Equipment Home Equipment: Environmental consultant - 2 wheels, [place] - single point, Grab bars - tub/shower, Civil engineer, contracting, Bedside commode, Wheelchair - manual  Prior Device Use: Indicate devices/aids used by the patient prior to current illness, exacerbation or injury? Manual wheelchair, Archivist  Prior Functional Level Prior Function Level of  Independence: Needs assistance Gait / Transfers Assistance Needed: Pt in w/c mostly with pt able to walk ~50 feet with the RW at a time.  ADL's / Homemaking Assistance Needed: husband reports he has been assisting minimally for showering, toileting, dressing  Self Care: Did the patient need help bathing, dressing, using the toilet or eating? Needed some help  Indoor Mobility: Did the patient need assistance with walking from room to room (with or without device)? Independent  Stairs: Did the patient need assistance with internal or external stairs (with or without device)? Needed some help  Functional Cognition: Did the patient need help planning regular tasks such as shopping or remembering to take medications? Needed some help  Current Functional Level Cognition  Arousal/Alertness: Awake/alert Overall Cognitive Status: Impaired/Different from baseline Orientation Level: Oriented to person, Oriented to place, Disoriented to time, Disoriented to situation Following Commands: Follows one step commands with increased time Safety/Judgement: Decreased awareness of safety, Decreased awareness of deficits Memory: (pt did not recall symptoms prior to admission) Awareness: Impaired Awareness Impairment: Intellectual impairment (to need for family/spouse to manage bills, appts, medications due to her memory deficit) Problem Solving: (for basic needs - to call RN on her phone)   Extremity Assessment (includes Sensation/Coordination)  Upper Extremity Assessment: Defer to OT evaluation  Lower Extremity Assessment: Generalized weakness (Bilateral weakness noted from old and new stroke)    ADLs       Mobility  Overal bed mobility: Needs Assistance Bed Mobility: Supine to Sit Supine to sit: Mod assist General bed mobility comments: Pt was able to transition to EOB with mod assist for trunk elevation and scooting forward so feet were resting on floor. Bed pad also used for assist  with scooting.     Transfers  Overall transfer level: Needs assistance Equipment used: Rolling walker (2 wheeled) Transfers: Sit to/from Stand Sit to Stand: Min assist, +2  physical assistance General transfer comment: VC's for hand placement on seated surface for safety. Pt required assist +2 to power-up to full standing position.     Ambulation / Gait / Stairs / Wheelchair Mobility  Ambulation/Gait Ambulation/Gait assistance: Min assist, +2 physical assistance Ambulation Distance (Feet): 8 Feet Assistive device: Rolling walker (2 wheeled) Gait Pattern/deviations: Decreased stride length, Shuffle, Trunk flexed General Gait Details: VC's for sequencing and technique with the RW. +2 assist provided for balance support and walker management. Increased time for pt to side step to middle of chair prior to initiating stand>sit. Gait velocity: decreased Gait velocity interpretation: Below normal speed for age/gender    Posture / Balance Dynamic Sitting Balance Sitting balance - Comments: Assist required initially for seated balance however pt was able to gain balance and sit without support for ~2 minutes (L lateral lean noted).  Balance Overall balance assessment: Needs assistance Sitting-balance support: Feet supported, No upper extremity supported Sitting balance-Leahy Scale: Poor Sitting balance - Comments: Assist required initially for seated balance however pt was able to gain balance and sit without support for ~2 minutes (L lateral lean noted).  Postural control: Posterior lean, Left lateral lean Standing balance support: Bilateral upper extremity supported, During functional activity Standing balance-Leahy Scale: Poor    Special needs/care consideration BiPAP/CPAP No CPM No Continuous Drip IV No Dialysis No  Life Vest No Oxygen No Special Bed No Trach Size No Wound Vac (area) No  Skin No  Bowel mgmt: No BM since admission  09/21/15 Bladder mgmt: Using bedpan to void Diabetic mgmt No    Previous Home Environment Living Arrangements: Spouse/significant other Available Help at Discharge: Family, Available 24 hours/day Type of Home: House Home Layout: One level Home Access: Level entry Bathroom Shower/Tub: Walk-in shower, Curtain Biochemist, clinical: Handicapped height Bathroom Accessibility: Yes  Discharge Living Setting Plans for Discharge Living Setting: Patient's home, House, Lives with (comment) (Lives with husband.) Type of Home at Discharge: House Discharge Home Layout: One level Discharge Home Access: Level entry Does the patient have any problems obtaining your medications?: No  Social/Family/Support Systems Patient Roles: Spouse, Parent (Has a husband and 3 sons.) Contact Information: Mardell Schuldt - spouse Anticipated Caregiver: Husband  Anticipated Caregiver's Contact Information: Jenny Reichmann - husband (h) [phone] (c) [phone] Ability/Limitations of Caregiver: Husband can assist and has been assisting at home. Oldest son close by and assists at times. Caregiver Availability: 24/7 Discharge Plan Discussed with Primary Caregiver: Yes Is Caregiver In Agreement with Plan?: Yes Does Caregiver/Family have Issues with Lodging/Transportation while Pt is in Rehab?: No  Goals/Additional Needs Patient/Family Goal for Rehab: PT/OT mod I and supervision goals Expected length of stay: 10-14 days Cultural Considerations: Baptist. 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 Dietary Needs: Regular diet, thin liquids Equipment Needs: TBD Pt/Family Agrees to Admission and willing to participate: Yes Program Orientation Provided & Reviewed with Pt/Caregiver Including Roles & Responsibilities: Yes  Decrease burden of Care through IP rehab admission: N/A  Possible need for SNF placement upon discharge: Not planned  Patient Condition: This  patient's condition remains as documented in the consult dated 09/12/15, in which the Rehabilitation Physician determined and documented that the patient's condition is appropriate for intensive rehabilitative care in an inpatient rehabilitation facility. Will admit to inpatient rehab today.  Preadmission Screen Completed By: Darlene Murray, 09/13/2015 1:56 PM ______________________________________________________________________  Discussed status with Dr. Letta Pate on 09/13/15 at 1353 and received telephone approval for admission today.  Admission  Coordinator: Darlene Murray, time1354/Date03/09/17          Cosigned by: Charlett Blake, MD at 09/13/2015 2:14 PM  Revision History     Date/Time User Provider Type Action   09/13/2015 2:14 PM Charlett Blake, MD Physician Cosign   09/13/2015 1:56 PM Darlene Diones, RN Rehab Admission Coordinator Sign   09/13/2015 1:54 PM Darlene Diones, RN Rehab Admission Coordinator Sign   View Details Report

## 2015-09-13 NOTE — H&P (Signed)
Physical Medicine and Rehabilitation Admission H&P    No chief complaint on file. : HPI: Darlene Murray is a 82 y.o. right handed female with history asthma maintained on prednisone, venous insufficiency and previous CVA with right-sided residual weakness and received inpatient rehabilitation services October 2016. Patient lives with husband. She was able to ambulate short distances with a walker but primarily stayed in a wheelchair and was receiving home health therapies. Husband has been assisting minimally for showering toileting and dressing. One level home with level entry. Presented 09/11/2015 with left-sided weakness and slurred speech. MRI of the brain shows acute nonhemorrhagic infarct right thalamus and posterior limb right internal capsule. Tiny acute medial left occipital lobe infarct. Remote bilateral cerebellar infarcts. MRA MRI of the head and neck showed moderate to marked narrowing proximal right vertebral artery as well as right subclavian artery and left subclavian artery. Echocardiogram pending. Patient did not receive TPA. Neurology consulted and currently maintained on aspirin and Plavix for CVA prophylaxis. Subcutaneous Lovenox for DVT prophylaxis. Tolerating a regular diet. Patient was admitted for comprehensive rehabilitation program  Pt feels like her weakness has improved over the last day  ROS Constitutional: Negative for fever and chills.  HENT: Negative for hearing loss.  Eyes:   Decreased vision right eye  Respiratory: Negative for cough.   Short of breath with exertion  Cardiovascular: Positive for leg swelling. Negative for chest pain and palpitations.  Gastrointestinal: Positive for constipation. Negative for nausea and vomiting.  Genitourinary: Negative for dysuria and hematuria.  Musculoskeletal: Positive for myalgias and joint pain.  Skin: Negative for rash.  Neurological: Positive for focal weakness and weakness. Negative for seizures and  headaches.  All other systems reviewed and are negative   Past Medical History  Diagnosis Date  . Blindness   . Asthmatic bronchitis   . Asthma   . Fibrocystic breast disease   . Allergic rhinitis   . Hyperlipidemia   . Venous insufficiency     lower extremities  . HNP (herniated nucleus pulposus), lumbar     L4-L5  . Left ventricular hypertrophy 2008    mild  . Antibiotic-associated diarrhea   . Stroke (cerebrum) [facility])    Past Surgical History  Procedure Laterality Date  . Cataract extraction    . Colonoscopy     Family History  Problem Relation Age of Onset  . Heart attack Sister   . Alzheimer's disease Sister   . Asthma Son    Social History:  reports that she has been passively smoking.  She has never used smokeless tobacco. She reports that she does not drink alcohol or use illicit drugs. Allergies: No Known Allergies Medications Prior to Admission  Medication Sig Dispense Refill  . albuterol (PROVENTIL HFA;VENTOLIN HFA) 108 (90 Base) MCG/ACT inhaler Inhale 2 puffs into the lungs every 6 (six) hours as needed for wheezing or shortness of breath. 1 Inhaler 2  . aspirin 81 MG tablet Take 81 mg by mouth daily.    . calcium carbonate (OS-CAL) 600 MG TABS Take 600 mg by mouth daily.      . cholecalciferol (VITAMIN D) 1000 UNITS tablet Take 1,000 Units by mouth daily.    . clopidogrel (PLAVIX) 75 MG tablet Take 1 tablet (75 mg total) by mouth daily. 30 tablet 11  . mometasone-formoterol (DULERA) 100-5 MCG/ACT AERO Inhale 2 puffs into the lungs 2 (two) times daily. 3 Inhaler 1  . Multiple Vitamin (MULTIVITAMIN) tablet Take 1 tablet by mouth daily.    Marland Kitchen  pravastatin (PRAVACHOL) 20 MG tablet Take 1 tablet (20 mg total) by mouth daily at 6 PM. 90 tablet 3  . predniSONE (DELTASONE) 5 MG tablet Take 1 tablet (5 mg total) by mouth daily. 30 tablet 5    Home:     Functional History:    Functional Status:  Mobility:          ADL:     Cognition: Cognition Orientation Level: Oriented to person, Oriented to place, Disoriented to time, Disoriented to situation    Physical Exam: Weight 73.029 kg (161 lb). Physical Exam Constitutional: She is oriented to person, place, and time. She appears well-developed and well-nourished.  82 year old African-American female sitting up in chair.  HENT:  Head: Normocephalic and atraumatic.  Eyes: Conjunctivae and EOM are normal. No scleral icterus.  Pupil slow to respond to light right greater than left  Neck: Normal range of motion. Neck supple. No thyromegaly present.  Cardiovascular: Normal rate and regular rhythm.  Respiratory: Effort normal and breath sounds normal. No respiratory distress.  GI: Soft. Bowel sounds are normal. She exhibits no distension.  Musculoskeletal: She exhibits no edema or tenderness.  Neurological: She is alert and oriented to person, place, and time. No cranial nerve deficit.  Patient able to provide her name and age.  She could not recall her latest rehabilitation stay.  Follows simple commands. Motor: LUE/LLE: 5/5 RUE/RLE: 4+/5 proximal to distal grossly  Skin: Skin is warm and dry.  Psychiatric: She has a normal mood and affect. Her behavior is normal. Thought content normal Sensation intact to LT in the UE and LEs  Results for orders placed or performed during the hospital encounter of 09/11/15 (from the past 48 hour(s))  Lipid panel     Status: Abnormal   Collection Time: 09/12/15  3:40 AM  Result Value Ref Range   Cholesterol 113 0 - 200 mg/dL   Triglycerides 48 <150 mg/dL   HDL 39 (L) >40 mg/dL   Total CHOL/HDL Ratio 2.9 RATIO   VLDL 10 0 - 40 mg/dL   LDL Cholesterol 64 0 - 99 mg/dL    Comment:        Total Cholesterol/HDL:CHD Risk Coronary Heart Disease Risk Table                     Men   Women  1/2 Average Risk   3.4   3.3  Average Risk       5.0   4.4  2 X Average Risk   9.6   7.1  3 X Average Risk  23.4   11.0        Use  the calculated Patient Ratio above and the CHD Risk Table to determine the patient's CHD Risk.        ATP III CLASSIFICATION (LDL):  <100     mg/dL   Optimal  100-129  mg/dL   Near or Above                    Optimal  130-159  mg/dL   Borderline  160-189  mg/dL   High  >190     mg/dL   Very High   Hemoglobin A1c     Status: Abnormal   Collection Time: 09/12/15  3:40 AM  Result Value Ref Range   Hgb A1c MFr Bld 5.7 (H) 4.8 - 5.6 %    Comment: (NOTE)         Pre-diabetes: 5.7 - 6.4  Diabetes: >6.4         Glycemic control for adults with diabetes: <7.0    Mean Plasma Glucose 117 mg/dL    Comment: (NOTE) Performed At: [facility] [place], [place] [id number] Lindon Romp MD [id number]    No results found.     Medical Problem List and Plan: 1.  Left-sided hemiparesis with dysarthria secondary to right posterior limb and left occipital right thalamic CVA with history of CVA 2016 and right side residual weakness 2.  DVT Prophylaxis/Anticoagulation: Subcutaneous Lovenox. Monitor platelet counts and any signs of bleeding 3. Pain Management: Tylenol as needed 4. Asthma. Continue chronic prednisone. Continue Dulera 5. Neuropsych: This patient is capable of making decisions on her own behalf. 6. Skin/Wound Care: Routine skin checks 7. Fluids/Electrolytes/Nutrition: Routine I&O with follow-up chemistries 8. Hyperlipidemia. Pravachol 9. Legally blind right eye    Post Admission Physician Evaluation: 1. Functional deficits secondary  to Left-sided hemiparesis with dysarthria secondary to right posterior limb and left occipital right thalamic CVA. 2. Patient is admitted to receive collaborative, interdisciplinary care between the physiatrist, rehab nursing staff, and therapy team. 3. Patient's level of medical complexity and substantial therapy needs in context of that medical necessity cannot be provided at a lesser intensity of care such as a  SNF. 4. Patient has experienced substantial functional loss from his/her baseline which was documented above under the "Functional History" and "Functional Status" headings.  Judging by the patient's diagnosis, physical exam, and functional history, the patient has potential for functional progress which will result in measurable gains while on inpatient rehab.  These gains will be of substantial and practical use upon discharge  in facilitating mobility and self-care at the household level. 5. Physiatrist will provide 24 hour management of medical needs as well as oversight of the therapy plan/treatment and provide guidance as appropriate regarding the interaction of the two. 6. 24 hour rehab nursing will assist with bladder management, bowel management, safety, skin/wound care, disease management, medication administration, pain management and patient education  and help integrate therapy concepts, techniques,education, etc. 7. PT will assess and treat for/with: pre gait, gait training, endurance , safety, equipment, neuromuscular re education.   Goals are: Sup. 8. OT will assess and treat for/with: ADLs, Cognitive perceptual skills, Neuromuscular re education, safety, endurance, equipment.   Goals are: Sup. Therapy may proceed with showering this patient. 9. SLP will assess and treat for/with: Memory attn, concentration.  Goals are: Sup with memory strategies and orientation. 10. Case Management and Social Worker will assess and treat for psychological issues and discharge planning. 11. Team conference will be held weekly to assess progress toward goals and to determine barriers to discharge. 12. Patient will receive at least 3 hours of therapy per day at least 5 days per week. 13. ELOS: 7-10d       14. Prognosis:  good     Charlett Blake M.D. [facility] FAAPM&R (Sports Med, Neuromuscular Med) Diplomate Am Board of Electrodiagnostic Med   09/13/2015

## 2015-09-13 NOTE — Care Management Note (Signed)
Case Management Note  Patient Details  Name: Ayelen Laplume MRN: [id number] Date of Birth: 19-May-1922  Subjective/Objective:                    Action/Plan: Patient discharging to CIR today. No further needs per CM.   Expected Discharge Date:                  Expected Discharge Plan:     In-House Referral:     Discharge planning Services     Post Acute Care Choice:    Choice offered to:     DME Arranged:    DME Agency:     HH Arranged:    HH Agency:     Status of Service:  In process, will continue to follow  Medicare Important Message Given:    Date Medicare IM Given:    Medicare IM give by:    Date Additional Medicare IM Given:    Additional Medicare Important Message give by:     If discussed at [place] of Stay Meetings, dates discussed:    Additional Comments:  Pollie Friar, RN 09/13/2015, 11:44 AM

## 2015-09-13 NOTE — Progress Notes (Signed)
Patient is being transferred to CIR. Report called to the receiving nurse Gwyndolyn Saxon

## 2015-09-13 NOTE — PMR Pre-admission (Signed)
PMR Admission Coordinator Pre-Admission Assessment  Patient: Darlene Murray is an 82 y.o., female MRN: [id number] DOB: [birth year] Height: 5\' 5"  (165.1 cm) Weight: 160 lbs             Insurance Information HMO:  No    PPO:       PCP:       IPA:       80/20:       OTHER:   PRIMARY: Medicare A/B      Policy#: [id number]      Subscriber: Darlene Murray CM Name:        Phone#:       Fax#:   Pre-Cert#:        Employer: Retired Benefits:  Phone #:       Name: Checked in [place]. Date:  03/07/89     Deduct: $1316      Out of Pocket Max: None      Life Max: unlimited CIR: 100%      SNF: 100 days Outpatient: 80%     Co-Pay: 20% Home Health: 100%      Co-Pay: none DME: 80%     Co-Pay: 20% Providers: patient's choice  SECONDARY: [facility]      Policy#: [id number]      Subscriber:  Darlene Murray CM Name:        Phone#:       Fax#:   Pre-Cert#:        Employer:   Benefits:  Phone #:       Name:   Eff. Date:       Deduct:        Out of Pocket Max:        Life Max:   CIR:        SNF:   Outpatient:       Co-Pay:   Home Health:        Co-Pay:   DME:       Co-Pay:    Emergency Contact Information Contact Information    Name Relation Home Work Mobile   Darlene Murray,Darlene Murray [phone]  [phone]   [place] Son [phone]       Current Medical History  Patient Admitting Diagnosis:  R PLIC, L occipital, R thalamic infarcts  History of Present Illness: A 82 y.o. right handed female with history asthma maintained on prednisone, venous insufficiency and previous CVA with right-sided residual weakness and received inpatient rehabilitation services October 2016. Patient lives with husband. She was able to ambulate short distances with a walker but primarily stayed in a wheelchair and was receiving home health therapies. Husband has been assisting minimally for showering toileting and dressing. One level home with level entry. Presented 09/11/2015 with left-sided weakness and  slurred speech. MRI of the brain shows acute nonhemorrhagic infarct right thalamus and posterior limb right internal capsule. Tiny acute medial left occipital lobe infarct. Remote bilateral cerebellar infarcts. MRA MRI of the head and neck showed moderate to marked narrowing proximal right vertebral artery as well as right subclavian artery and left subclavian artery. Echocardiogram pending. Patient did not receive TPA. Neurology consulted and currently maintained on aspirin and Plavix for CVA prophylaxis. Subcutaneous Lovenox for DVT prophylaxis. Tolerating a regular diet. Patient to be admitted for comprehensive inpatient rehabilitation program.    Total: 11=NIH  Past Medical History  Past Medical History  Diagnosis Date  . Blindness   . Asthmatic bronchitis   . Asthma   . Fibrocystic breast disease   .  Allergic rhinitis   . Hyperlipidemia   . Venous insufficiency     lower extremities  . HNP (herniated nucleus pulposus), lumbar     L4-L5  . Left ventricular hypertrophy 2008    mild  . Antibiotic-associated diarrhea   . Stroke (cerebrum) [facility])     Family History  family history includes Alzheimer's disease in her sister; Asthma in her son; Heart attack in her sister.  Prior Rehab/Hospitalizations: [facility] PT and aide 3 X a week.  Patient has had several CVAs.  Has been to CIR 10/16 most recently.  Has the patient had major surgery during 100 days prior to admission? No  Current Medications   Current facility-administered medications:  .   stroke: mapping our early stages of recovery book, , Does not apply, Once, [name], MD .  acetaminophen (TYLENOL) tablet 650 mg, 650 mg, Oral, Q6H PRN **OR** acetaminophen (TYLENOL) suppository 650 mg, 650 mg, Rectal, Q6H PRN, [name], MD .  aspirin suppository 300 mg, 300 mg, Rectal, Daily **OR** aspirin tablet 325 mg, 325 mg, Oral, Daily, [name], MD, 325 mg at 09/13/15 0911 .  clopidogrel (PLAVIX) tablet 75 mg, 75 mg,  Oral, Daily, [name], MD, 75 mg at 09/13/15 0911 .  enoxaparin (LOVENOX) injection 40 mg, 40 mg, Subcutaneous, Daily, [name], MD, 40 mg at 09/13/15 0911 .  mometasone-formoterol (DULERA) 100-5 MCG/ACT inhaler 2 puff, 2 puff, Inhalation, BID, [name], MD, 2 puff at 09/13/15 1139 .  ondansetron (ZOFRAN) tablet 4 mg, 4 mg, Oral, Q6H PRN **OR** ondansetron (ZOFRAN) injection 4 mg, 4 mg, Intravenous, Q6H PRN, [name], MD .  pravastatin (PRAVACHOL) tablet 20 mg, 20 mg, Oral, q1800, [name], MD, 20 mg at 09/12/15 1708 .  predniSONE (DELTASONE) tablet 5 mg, 5 mg, Oral, Daily, [name], MD, 5 mg at 09/13/15 0911  Patients Current Diet: Diet regular Room service appropriate?: Yes; Fluid consistency:: Thin  Precautions / Restrictions Precautions Precautions: Fall Restrictions Weight Bearing Restrictions: No   Has the patient had 2 or more falls or a fall with injury in the past year?Yes.  Has had 3 falls with no injury  Prior Activity Level Limited Community (1-2x/wk): Goes out 1-2 X a week, Not driving.  Home Assistive Devices / Equipment Home Equipment: Environmental consultant - 2 wheels, [place] - single point, Grab bars - tub/shower, Civil engineer, contracting, Bedside commode, Wheelchair - manual  Prior Device Use: Indicate devices/aids used by the patient prior to current illness, exacerbation or injury? Manual wheelchair, Archivist  Prior Functional Level Prior Function Level of Independence: Needs assistance Gait / Transfers Assistance Needed: Pt in w/c mostly with pt able to walk ~50 feet with the RW at a time.  ADL's / Homemaking Assistance Needed: husband reports he has been assisting minimally for showering, toileting, dressing  Self Care: Did the patient need help bathing, dressing, using the toilet or eating?  Needed some help  Indoor Mobility: Did the patient need assistance with walking from room to room (with or without device)? Independent  Stairs: Did  the patient need assistance with internal or external stairs (with or without device)? Needed some help  Functional Cognition: Did the patient need help planning regular tasks such as shopping or remembering to take medications? Needed some help  Current Functional Level Cognition  Arousal/Alertness: Awake/alert Overall Cognitive Status: Impaired/Different from baseline Orientation Level: Oriented to person, Oriented to place, Disoriented to time, Disoriented to situation  Following Commands: Follows one step commands with increased time Safety/Judgement: Decreased awareness of safety, Decreased awareness of deficits Memory:  (pt did not recall symptoms prior to admission) Awareness: Impaired Awareness Impairment: Intellectual impairment (to need for family/spouse to manage bills, appts, medications due to her memory deficit) Problem Solving:  (for basic needs - to call RN on her phone)    Extremity Assessment (includes Sensation/Coordination)  Upper Extremity Assessment: Defer to OT evaluation  Lower Extremity Assessment: Generalized weakness (Bilateral weakness noted from old and new stroke)    ADLs       Mobility  Overal bed mobility: Needs Assistance Bed Mobility: Supine to Sit Supine to sit: Mod assist General bed mobility comments: Pt was able to transition to EOB with mod assist for trunk elevation and scooting forward so feet were resting on floor. Bed pad also used for assist with scooting.     Transfers  Overall transfer level: Needs assistance Equipment used: Rolling walker (2 wheeled) Transfers: Sit to/from Stand Sit to Stand: Min assist, +2 physical assistance General transfer comment: VC's for hand placement on seated surface for safety. Pt required assist +2 to power-up to full standing position.     Ambulation / Gait / Stairs / Wheelchair Mobility  Ambulation/Gait Ambulation/Gait assistance: Min assist, +2 physical assistance Ambulation Distance (Feet): 8  Feet Assistive device: Rolling walker (2 wheeled) Gait Pattern/deviations: Decreased stride length, Shuffle, Trunk flexed General Gait Details: VC's for sequencing and technique with the RW. +2 assist provided for balance support and walker management. Increased time for pt to side step to middle of chair prior to initiating stand>sit. Gait velocity: decreased Gait velocity interpretation: Below normal speed for age/gender    Posture / Balance Dynamic Sitting Balance Sitting balance - Comments: Assist required initially for seated balance however pt was able to gain balance and sit without support for ~2 minutes (L lateral lean noted).  Balance Overall balance assessment: Needs assistance Sitting-balance support: Feet supported, No upper extremity supported Sitting balance-Leahy Scale: Poor Sitting balance - Comments: Assist required initially for seated balance however pt was able to gain balance and sit without support for ~2 minutes (L lateral lean noted).  Postural control: Posterior lean, Left lateral lean Standing balance support: Bilateral upper extremity supported, During functional activity Standing balance-Leahy Scale: Poor    Special needs/care consideration BiPAP/CPAP No CPM No Continuous Drip IV No Dialysis No        Life Vest No Oxygen No Special Bed No Trach Size No Wound Vac (area) No       Skin No                           Bowel mgmt: No BM since admission 09/21/15 Bladder mgmt: Using bedpan to void Diabetic mgmt No    Previous Home Environment Living Arrangements: Spouse/significant other Available Help at Discharge: Family, Available 24 hours/day Type of Home: House Home Layout: One level Home Access: Level entry Bathroom Shower/Tub: Walk-in shower, Curtain Biochemist, clinical: Handicapped height Bathroom Accessibility: Yes  Discharge Living Setting Plans for Discharge Living Setting: Patient's home, House, Lives with (comment) (Lives with husband.) Type of  Home at Discharge: House Discharge Home Layout: One level Discharge Home Access: Level entry Does the patient have any problems obtaining your medications?: No  Social/Family/Support Systems Patient Roles: Spouse, Parent (Has a husband and 3 sons.) Contact Information: Darlene Murray - spouse Anticipated Caregiver: Husband  Anticipated Caregiver's Contact Information: Darlene Murray - husband (  h) [phone] (c) [phone] Ability/Limitations of Caregiver: Husband can assist and has been assisting at home.  Oldest son close by and assists at times. Caregiver Availability: 24/7 Discharge Plan Discussed with Primary Caregiver: Yes Is Caregiver In Agreement with Plan?: Yes Does Caregiver/Family have Issues with Lodging/Transportation while Pt is in Rehab?: No  Goals/Additional Needs Patient/Family Goal for Rehab: PT/OT mod I and supervision goals Expected length of stay: 10-14 days Cultural Considerations: [employer].  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[place] fellowship up until 09/16 Dietary Needs: Regular diet, thin liquids Equipment Needs: TBD Pt/Family Agrees to Admission and willing to participate: Yes Program Orientation Provided & Reviewed with Pt/Caregiver Including Roles  & Responsibilities: Yes  Decrease burden of Care through IP rehab admission: N/A  Possible need for SNF placement upon discharge: Not planned  Patient Condition: This patient's condition remains as documented in the consult dated 09/12/15, in which the Rehabilitation Physician determined and documented that the patient's condition is appropriate for intensive rehabilitative care in an inpatient rehabilitation facility. Will admit to inpatient rehab today.  Preadmission Screen Completed By:  [name], 09/13/2015 1:56 PM ______________________________________________________________________   Discussed status with Dr. Letta Pate on 09/13/15 at 1353 and received telephone approval  for admission today.  Admission Coordinator:  [name], time1354/Date03/09/17

## 2015-09-13 NOTE — Progress Notes (Signed)
OT Cancellation Note  Patient Details Name: Darlene Murray MRN: [id number] DOB: 21-Jul-1921   Cancelled Treatment:    Reason Eval/Treat Not Completed: Other (comment) (Plan to d/c to CIR today; will defer OT eval to next venue). Please re-consult if needs change. Thank you for this referral.  Binnie Kand M.S., OTR/L Pager: [phone]  09/13/2015, 11:52 AM

## 2015-09-13 NOTE — Progress Notes (Signed)
Ankit Lorie Phenix, MD Physician Signed Physical Medicine and Rehabilitation Consult Note 09/12/2015 1:42 PM  Related encounter: ED to Hosp-Admission (Current) from 09/11/2015 in [place] All Collapse All        Physical Medicine and Rehabilitation Consult Reason for Consult: Right posterior limb internal capsule and left occipital lobe, right thalamic infarct Referring Physician: [facility]   HPI: Darlene Murray is a 82 y.o. right handed female with history asthma maintained on prednisone, venous insufficiency and previous CVA with right-sided residual weakness and received inpatient rehabilitation services October 2016. Patient lives with husband. She was able to ambulate short distances with a walker but primarily stayed in a wheelchair and was receiving home health therapies. Husband has been assisting minimally for showering toileting and dressing. One level home with level entry. Presented 09/11/2015 with left-sided weakness and slurred speech. MRI of the brain shows acute nonhemorrhagic infarct right thalamus and posterior limb right internal capsule. Tiny acute medial left occipital lobe infarct. Remote bilateral cerebellar infarcts. MRA MRI of the head and neck showed moderate to marked narrowing proximal right vertebral artery as well as right subclavian artery and left subclavian artery. Echocardiogram pending. Patient did not receive TPA. Neurology consulted and currently maintained on aspirin and Plavix for CVA prophylaxis. Subcutaneous Lovenox for DVT prophylaxis. Tolerating a regular diet.  Review of Systems  Constitutional: Negative for fever and chills.  HENT: Negative for hearing loss.  Eyes:   Decreased vision right eye  Respiratory: Negative for cough.   Short of breath with exertion  Cardiovascular: Positive for leg swelling. Negative for chest pain and palpitations.  Gastrointestinal: Positive for constipation.  Negative for nausea and vomiting.  Genitourinary: Negative for dysuria and hematuria.  Musculoskeletal: Positive for myalgias and joint pain.  Skin: Negative for rash.  Neurological: Positive for focal weakness and weakness. Negative for seizures and headaches.  All other systems reviewed and are negative.  Past Medical History  Diagnosis Date  . Blindness   . Asthmatic bronchitis   . Asthma   . Fibrocystic breast disease   . Allergic rhinitis   . Hyperlipidemia   . Venous insufficiency     lower extremities  . HNP (herniated nucleus pulposus), lumbar     L4-L5  . Left ventricular hypertrophy 2008    mild  . Antibiotic-associated diarrhea   . Stroke (cerebrum) [facility])    Past Surgical History  Procedure Laterality Date  . Cataract extraction    . Colonoscopy     Family History  Problem Relation Age of Onset  . Heart attack Sister   . Alzheimer's disease Sister   . Asthma Son    Social History:  reports that she has been passively smoking. She has never used smokeless tobacco. She reports that she does not drink alcohol or use illicit drugs. Allergies: No Known Allergies Medications Prior to Admission  Medication Sig Dispense Refill  . albuterol (PROVENTIL HFA;VENTOLIN HFA) 108 (90 Base) MCG/ACT inhaler Inhale 2 puffs into the lungs every 6 (six) hours as needed for wheezing or shortness of breath. 1 Inhaler 2  . aspirin 81 MG tablet Take 81 mg by mouth daily.    . calcium carbonate (OS-CAL) 600 MG TABS Take 600 mg by mouth daily.     . cholecalciferol (VITAMIN D) 1000 UNITS tablet Take 1,000 Units by mouth daily.    . clopidogrel (PLAVIX) 75 MG tablet Take 1 tablet (75 mg total) by mouth daily. [place]  tablet 11  . mometasone-formoterol (DULERA) 100-5 MCG/ACT AERO Inhale 2 puffs into the lungs 2 (two) times daily. 3 Inhaler 1  . Multiple Vitamin (MULTIVITAMIN) tablet  Take 1 tablet by mouth daily.    . pravastatin (PRAVACHOL) 20 MG tablet Take 1 tablet (20 mg total) by mouth daily at 6 PM. 90 tablet 3  . predniSONE (DELTASONE) 5 MG tablet Take 1 tablet (5 mg total) by mouth daily. 30 tablet 5    Home: Home Living Family/patient expects to be discharged to:: Private residence Living Arrangements: Spouse/significant other Available Help at Discharge: Family, Available 24 hours/day Type of Home: House Home Access: Level entry Home Layout: One level Bathroom Shower/Tub: Gaffer, Curtain Bathroom Toilet: Handicapped height Bathroom Accessibility: Yes Home Equipment: Environmental consultant - 2 wheels, Cane - single point, Grab bars - tub/shower, Civil engineer, contracting, Bedside commode, Wheelchair - manual  Functional History: Prior Function Level of Independence: Needs assistance Gait / Transfers Assistance Needed: Pt in w/c mostly with pt able to walk ~50 feet with the RW at a time.  ADL's / Homemaking Assistance Needed: husband reports he has been assisting minimally for showering, toileting, dressing Functional Status:  Mobility: Bed Mobility Overal bed mobility: Needs Assistance Bed Mobility: Supine to Sit Supine to sit: Mod assist General bed mobility comments: Pt was able to transition to EOB with mod assist for trunk elevation and scooting forward so feet were resting on floor. Bed pad also used for assist with scooting.  Transfers Overall transfer level: Needs assistance Equipment used: Rolling walker (2 wheeled) Transfers: Sit to/from Stand Sit to Stand: Min assist, +2 physical assistance General transfer comment: VC's for hand placement on seated surface for safety. Pt required assist +2 to power-up to full standing position.  Ambulation/Gait Ambulation/Gait assistance: Min assist, +2 physical assistance Ambulation Distance (Feet): 8 Feet Assistive device: Rolling walker (2 wheeled) Gait Pattern/deviations: Decreased stride length, Shuffle,  Trunk flexed General Gait Details: VC's for sequencing and technique with the RW. +2 assist provided for balance support and walker management. Increased time for pt to side step to middle of chair prior to initiating stand>sit. Gait velocity: decreased Gait velocity interpretation: Below normal speed for age/gender    ADL:    Cognition: Cognition Overall Cognitive Status: Impaired/Different from baseline Arousal/Alertness: Awake/alert Orientation Level: Oriented to person, Disoriented to place, Disoriented to time, Disoriented to situation Memory: (pt did not recall symptoms prior to admission) Awareness: Impaired Awareness Impairment: Intellectual impairment (to need for family/spouse to manage bills, appts, medications due to her memory deficit) Problem Solving: (for basic needs - to call RN on her phone) Cognition Arousal/Alertness: Awake/alert Behavior During Therapy: WFL for tasks assessed/performed Overall Cognitive Status: Impaired/Different from baseline Area of Impairment: Orientation, Memory, Following commands, Safety/judgement, Problem solving Orientation Level: Disoriented to, Time, Situation (Could not tell me her birth year but knew month/day) Memory: Decreased short-term memory Following Commands: Follows one step commands with increased time Safety/Judgement: Decreased awareness of safety, Decreased awareness of deficits Problem Solving: Slow processing, Decreased initiation, Requires verbal cues, Difficulty sequencing  Blood pressure 119/67, pulse 84, temperature 98.6 F (37 C), temperature source Oral, resp. rate 16, height 5\' 5"  (1.651 m), SpO2 99 %. Physical Exam  Vitals reviewed. Constitutional: She is oriented to person, place, and time. She appears well-developed and well-nourished.  82 year old African-American female sitting up in chair.  HENT:  Head: Normocephalic and atraumatic.  Eyes: Conjunctivae and EOM are normal. No scleral icterus.  Pupil  slow to respond to light right  greater than left  Neck: Normal range of motion. Neck supple. No thyromegaly present.  Cardiovascular: Normal rate and regular rhythm.  Respiratory: Effort normal and breath sounds normal. No respiratory distress.  GI: Soft. Bowel sounds are normal. She exhibits no distension.  Musculoskeletal: She exhibits no edema or tenderness.  Neurological: She is alert and oriented to person, place, and time. No cranial nerve deficit.  Patient able to provide her name and age.  She could not recall her latest rehabilitation stay.  Follows simple commands. Motor: LUE/LLE: 5/5 RUE/RLE: 4+/5 proximal to distal grossly  Skin: Skin is warm and dry.  Psychiatric: She has a normal mood and affect. Her behavior is normal. Thought content normal.     Lab Results Last 24 Hours    Results for orders placed or performed during the hospital encounter of 09/11/15 (from the past 24 hour(s))  Lipid panel Status: Abnormal   Collection Time: 09/12/15 3:40 AM  Result Value Ref Range   Cholesterol 113 0 - 200 mg/dL   Triglycerides 48 <150 mg/dL   HDL 39 (L) >40 mg/dL   Total CHOL/HDL Ratio 2.9 RATIO   VLDL 10 0 - 40 mg/dL   LDL Cholesterol 64 0 - 99 mg/dL      Imaging Results (Last 48 hours)    Mr Virgel Paling Wo Contrast  09/11/2015 CLINICAL DATA: 82 year old female with slurred speech and left-sided weakness with left facial droop. History of stroke. Subsequent encounter. EXAM: MRI HEAD WITHOUT AND WITH CONTRAST AND MRA HEAD WITHOUT CONTRAST AND MRI NECK WITHOUT AND WITH CONTRAST TECHNIQUE: Multiplanar, multiecho pulse sequences of the brain and surrounding structures were obtained without and with intravenous contrast. Angiographic images of the head were obtained using MRA technique without contrast. Multiplanar, multiecho pulse sequences of the neck and surrounding structures were obtained without and with intravenous contrast. CONTRAST: 75mL  MULTIHANCE GADOBENATE DIMEGLUMINE 529 MG/ML IV SOLN COMPARISON: 09/11/2015 head CT. 08/02/2015 brain MR. FINDINGS: MRI HEAD FINDINGS Exam is motion degraded. Acute nonhemorrhagic infarct right thalamus and posterior limb right internal capsule. Question tiny acute medial left occipital lobe infarct. Remote bilateral cerebellar infarcts. Remote infarct right middle cerebellar peduncle. Remote thalamic infarcts. Mild small vessel disease changes. Global atrophy without hydrocephalus. No intracranial hemorrhage. No intracranial enhancing lesion. Longstanding complete opacification right sphenoid sinus without expansion. Inspissated material suspected. Mucosal thickening remainder of paranasal sinuses with opacification posterior left ethmoid sinus air cells. Elongated right globe. Post lens replacement. Partially empty sella. Mild spinal stenosis upper cervical spine. Cervical medullary junction unremarkable. MRA HEAD FINDINGS Exam is motion degraded. Mild narrowing pre cavernous and supraclinoid segment of the internal carotid artery bilaterally with ectasia of cavernous segment bilaterally. Fetal contribution to the right posterior cerebral artery. Mild narrowing A1 segment anterior cerebral artery bilaterally. Moderate narrowing A2 segment anterior cerebral artery bilaterally. Moderate to marked narrowing middle cerebral artery branches bilaterally. Marked narrowing distal left vertebral artery. Moderate to marked narrowing distal right vertebral artery. Mild narrowing basilar artery. Nonvisualized posterior inferior cerebellar arteries, anterior inferior cerebellar arteries and superior cerebellar arteries. Mark narrowing posterior cerebral arteries bilaterally more notable on the left. Prominent basilar tip may reflect atherosclerotic changes rather than saccular aneurysm. MRI NECK FINDINGS Exam is motion degraded. Ectatic common carotid arteries bilaterally. Loss of signal right common carotid artery may be  related to artifact rather than true stenosis. Appearance of hemodynamically significant stenosis proximal internal carotid artery bilaterally may be accentuated by motion artifact. Mild to moderate narrowing proximal 3 cm internal carotid  artery bilaterally. Poor delineation majority of the left vertebral artery. Moderate to marked narrowing proximal right vertebral artery. Mild to moderate narrowing proximal right subclavian artery. Moderate to marked tandem stenosis left subclavian artery. IMPRESSION: MRI HEAD Exam is motion degraded. Acute nonhemorrhagic infarct right thalamus and posterior limb right internal capsule. Question tiny acute medial left occipital lobe infarct. Remote bilateral cerebellar infarcts. Remote infarct right middle cerebellar peduncle. Remote thalamic infarcts. Mild small vessel disease changes. Global atrophy without hydrocephalus. No intracranial hemorrhage. No intracranial enhancing lesion. Longstanding complete opacification right sphenoid sinus without expansion. Inspissated material suspected. Mucosal thickening remainder of paranasal sinuses with opacification posterior left ethmoid sinus air cells. Elongated right globe. Post lens replacement. MRA HEAD Exam is motion degraded. Mild narrowing pre cavernous and supraclinoid segment of the internal carotid artery bilaterally with ectasia of cavernous segment bilaterally. Mild narrowing A1 segment anterior cerebral artery bilaterally. Moderate narrowing A2 segment anterior cerebral artery bilaterally. Moderate to marked narrowing middle cerebral artery branches bilaterally. Marked narrowing distal left vertebral artery. Moderate to marked narrowing distal right vertebral artery. Mild narrowing basilar artery. Nonvisualized posterior inferior cerebellar arteries, anterior inferior cerebellar arteries and superior cerebellar arteries. Mark narrowing posterior cerebral arteries bilaterally more notable on the left. Prominent basilar tip  may reflect atherosclerotic changes rather than saccular aneurysm. MRI NECK Exam is motion degraded. Ectatic common carotid arteries bilaterally. Loss of signal right common carotid artery may be related to artifact rather than true stenosis. Appearance of hemodynamically significant stenosis proximal internal carotid artery bilaterally may be accentuated by motion artifact. Mild to moderate narrowing proximal 3 cm internal carotid artery bilaterally. Poor delineation majority of the left vertebral artery. Moderate to marked narrowing proximal right vertebral artery. Mild to moderate narrowing proximal right subclavian artery. Moderate to marked tandem stenosis left subclavian artery. Electronically Signed By: Genia Del M.D. On: 09/11/2015 10:33   Mr Angiogram Neck W Wo Contrast  09/11/2015 CLINICAL DATA: 82 year old female with slurred speech and left-sided weakness with left facial droop. History of stroke. Subsequent encounter. EXAM: MRI HEAD WITHOUT AND WITH CONTRAST AND MRA HEAD WITHOUT CONTRAST AND MRI NECK WITHOUT AND WITH CONTRAST TECHNIQUE: Multiplanar, multiecho pulse sequences of the brain and surrounding structures were obtained without and with intravenous contrast. Angiographic images of the head were obtained using MRA technique without contrast. Multiplanar, multiecho pulse sequences of the neck and surrounding structures were obtained without and with intravenous contrast. CONTRAST: 19mL MULTIHANCE GADOBENATE DIMEGLUMINE 529 MG/ML IV SOLN COMPARISON: 09/11/2015 head CT. 08/02/2015 brain MR. FINDINGS: MRI HEAD FINDINGS Exam is motion degraded. Acute nonhemorrhagic infarct right thalamus and posterior limb right internal capsule. Question tiny acute medial left occipital lobe infarct. Remote bilateral cerebellar infarcts. Remote infarct right middle cerebellar peduncle. Remote thalamic infarcts. Mild small vessel disease changes. Global atrophy without hydrocephalus. No intracranial  hemorrhage. No intracranial enhancing lesion. Longstanding complete opacification right sphenoid sinus without expansion. Inspissated material suspected. Mucosal thickening remainder of paranasal sinuses with opacification posterior left ethmoid sinus air cells. Elongated right globe. Post lens replacement. Partially empty sella. Mild spinal stenosis upper cervical spine. Cervical medullary junction unremarkable. MRA HEAD FINDINGS Exam is motion degraded. Mild narrowing pre cavernous and supraclinoid segment of the internal carotid artery bilaterally with ectasia of cavernous segment bilaterally. Fetal contribution to the right posterior cerebral artery. Mild narrowing A1 segment anterior cerebral artery bilaterally. Moderate narrowing A2 segment anterior cerebral artery bilaterally. Moderate to marked narrowing middle cerebral artery branches bilaterally. Marked narrowing distal left vertebral artery. Moderate to marked narrowing  distal right vertebral artery. Mild narrowing basilar artery. Nonvisualized posterior inferior cerebellar arteries, anterior inferior cerebellar arteries and superior cerebellar arteries. Mark narrowing posterior cerebral arteries bilaterally more notable on the left. Prominent basilar tip may reflect atherosclerotic changes rather than saccular aneurysm. MRI NECK FINDINGS Exam is motion degraded. Ectatic common carotid arteries bilaterally. Loss of signal right common carotid artery may be related to artifact rather than true stenosis. Appearance of hemodynamically significant stenosis proximal internal carotid artery bilaterally may be accentuated by motion artifact. Mild to moderate narrowing proximal 3 cm internal carotid artery bilaterally. Poor delineation majority of the left vertebral artery. Moderate to marked narrowing proximal right vertebral artery. Mild to moderate narrowing proximal right subclavian artery. Moderate to marked tandem stenosis left subclavian artery.  IMPRESSION: MRI HEAD Exam is motion degraded. Acute nonhemorrhagic infarct right thalamus and posterior limb right internal capsule. Question tiny acute medial left occipital lobe infarct. Remote bilateral cerebellar infarcts. Remote infarct right middle cerebellar peduncle. Remote thalamic infarcts. Mild small vessel disease changes. Global atrophy without hydrocephalus. No intracranial hemorrhage. No intracranial enhancing lesion. Longstanding complete opacification right sphenoid sinus without expansion. Inspissated material suspected. Mucosal thickening remainder of paranasal sinuses with opacification posterior left ethmoid sinus air cells. Elongated right globe. Post lens replacement. MRA HEAD Exam is motion degraded. Mild narrowing pre cavernous and supraclinoid segment of the internal carotid artery bilaterally with ectasia of cavernous segment bilaterally. Mild narrowing A1 segment anterior cerebral artery bilaterally. Moderate narrowing A2 segment anterior cerebral artery bilaterally. Moderate to marked narrowing middle cerebral artery branches bilaterally. Marked narrowing distal left vertebral artery. Moderate to marked narrowing distal right vertebral artery. Mild narrowing basilar artery. Nonvisualized posterior inferior cerebellar arteries, anterior inferior cerebellar arteries and superior cerebellar arteries. Mark narrowing posterior cerebral arteries bilaterally more notable on the left. Prominent basilar tip may reflect atherosclerotic changes rather than saccular aneurysm. MRI NECK Exam is motion degraded. Ectatic common carotid arteries bilaterally. Loss of signal right common carotid artery may be related to artifact rather than true stenosis. Appearance of hemodynamically significant stenosis proximal internal carotid artery bilaterally may be accentuated by motion artifact. Mild to moderate narrowing proximal 3 cm internal carotid artery bilaterally. Poor delineation majority of the left  vertebral artery. Moderate to marked narrowing proximal right vertebral artery. Mild to moderate narrowing proximal right subclavian artery. Moderate to marked tandem stenosis left subclavian artery. Electronically Signed By: Genia Del M.D. On: 09/11/2015 10:33   Mr Jeri Cos [id number] Contrast  09/11/2015 CLINICAL DATA: 82 year old female with slurred speech and left-sided weakness with left facial droop. History of stroke. Subsequent encounter. EXAM: MRI HEAD WITHOUT AND WITH CONTRAST AND MRA HEAD WITHOUT CONTRAST AND MRI NECK WITHOUT AND WITH CONTRAST TECHNIQUE: Multiplanar, multiecho pulse sequences of the brain and surrounding structures were obtained without and with intravenous contrast. Angiographic images of the head were obtained using MRA technique without contrast. Multiplanar, multiecho pulse sequences of the neck and surrounding structures were obtained without and with intravenous contrast. CONTRAST: 63mL MULTIHANCE GADOBENATE DIMEGLUMINE 529 MG/ML IV SOLN COMPARISON: 09/11/2015 head CT. 08/02/2015 brain MR. FINDINGS: MRI HEAD FINDINGS Exam is motion degraded. Acute nonhemorrhagic infarct right thalamus and posterior limb right internal capsule. Question tiny acute medial left occipital lobe infarct. Remote bilateral cerebellar infarcts. Remote infarct right middle cerebellar peduncle. Remote thalamic infarcts. Mild small vessel disease changes. Global atrophy without hydrocephalus. No intracranial hemorrhage. No intracranial enhancing lesion. Longstanding complete opacification right sphenoid sinus without expansion. Inspissated material suspected. Mucosal thickening remainder of paranasal sinuses with  opacification posterior left ethmoid sinus air cells. Elongated right globe. Post lens replacement. Partially empty sella. Mild spinal stenosis upper cervical spine. Cervical medullary junction unremarkable. MRA HEAD FINDINGS Exam is motion degraded. Mild narrowing pre cavernous and supraclinoid  segment of the internal carotid artery bilaterally with ectasia of cavernous segment bilaterally. Fetal contribution to the right posterior cerebral artery. Mild narrowing A1 segment anterior cerebral artery bilaterally. Moderate narrowing A2 segment anterior cerebral artery bilaterally. Moderate to marked narrowing middle cerebral artery branches bilaterally. Marked narrowing distal left vertebral artery. Moderate to marked narrowing distal right vertebral artery. Mild narrowing basilar artery. Nonvisualized posterior inferior cerebellar arteries, anterior inferior cerebellar arteries and superior cerebellar arteries. Mark narrowing posterior cerebral arteries bilaterally more notable on the left. Prominent basilar tip may reflect atherosclerotic changes rather than saccular aneurysm. MRI NECK FINDINGS Exam is motion degraded. Ectatic common carotid arteries bilaterally. Loss of signal right common carotid artery may be related to artifact rather than true stenosis. Appearance of hemodynamically significant stenosis proximal internal carotid artery bilaterally may be accentuated by motion artifact. Mild to moderate narrowing proximal 3 cm internal carotid artery bilaterally. Poor delineation majority of the left vertebral artery. Moderate to marked narrowing proximal right vertebral artery. Mild to moderate narrowing proximal right subclavian artery. Moderate to marked tandem stenosis left subclavian artery. IMPRESSION: MRI HEAD Exam is motion degraded. Acute nonhemorrhagic infarct right thalamus and posterior limb right internal capsule. Question tiny acute medial left occipital lobe infarct. Remote bilateral cerebellar infarcts. Remote infarct right middle cerebellar peduncle. Remote thalamic infarcts. Mild small vessel disease changes. Global atrophy without hydrocephalus. No intracranial hemorrhage. No intracranial enhancing lesion. Longstanding complete opacification right sphenoid sinus without expansion.  Inspissated material suspected. Mucosal thickening remainder of paranasal sinuses with opacification posterior left ethmoid sinus air cells. Elongated right globe. Post lens replacement. MRA HEAD Exam is motion degraded. Mild narrowing pre cavernous and supraclinoid segment of the internal carotid artery bilaterally with ectasia of cavernous segment bilaterally. Mild narrowing A1 segment anterior cerebral artery bilaterally. Moderate narrowing A2 segment anterior cerebral artery bilaterally. Moderate to marked narrowing middle cerebral artery branches bilaterally. Marked narrowing distal left vertebral artery. Moderate to marked narrowing distal right vertebral artery. Mild narrowing basilar artery. Nonvisualized posterior inferior cerebellar arteries, anterior inferior cerebellar arteries and superior cerebellar arteries. Mark narrowing posterior cerebral arteries bilaterally more notable on the left. Prominent basilar tip may reflect atherosclerotic changes rather than saccular aneurysm. MRI NECK Exam is motion degraded. Ectatic common carotid arteries bilaterally. Loss of signal right common carotid artery may be related to artifact rather than true stenosis. Appearance of hemodynamically significant stenosis proximal internal carotid artery bilaterally may be accentuated by motion artifact. Mild to moderate narrowing proximal 3 cm internal carotid artery bilaterally. Poor delineation majority of the left vertebral artery. Moderate to marked narrowing proximal right vertebral artery. Mild to moderate narrowing proximal right subclavian artery. Moderate to marked tandem stenosis left subclavian artery. Electronically Signed By: Genia Del M.D. On: 09/11/2015 10:33   Ct Head Code Stroke W/o Cm  09/11/2015 CLINICAL DATA: 82 year old female with slurred speech, left-sided weakness and left facial droop. History stroke. Initial encounter. EXAM: CT HEAD WITHOUT CONTRAST TECHNIQUE: Contiguous axial images  were obtained from the base of the skull through the vertex without intravenous contrast. COMPARISON: 08/02/2015 brain MR and head CT. FINDINGS: No intracranial hemorrhage. Several prior remote infarcts with appearance of evolution/progression of right thalamic and posterior limb right internal capsule infarct. Moderate small vessel disease changes. Global atrophy without hydrocephalus.  No intracranial mass lesion noted on this unenhanced exam. Exophthalmos. Opacification right sphenoid sinus. Mild mucosal thickening maxillary sinuses and ethmoid sinus air cells greater on the left. IMPRESSION: No intracranial hemorrhage. Several prior remote infarcts with appearance of evolution/progression of right thalamic and posterior limb right internal capsule infarct. Moderate small vessel disease changes. Global atrophy without hydrocephalus. Opacification right sphenoid sinus. Mild mucosal thickening maxillary sinuses and ethmoid sinus air cells greater on the left. These results were called by telephone at the time of interpretation on 09/11/2015 at 6:39 am to Dr. Everlene Balls , who verbally acknowledged these results. Electronically Signed By: Genia Del M.D. On: 09/11/2015 06:41     Assessment/Plan: Diagnosis: Right posterior limb internal capsule and left occipital lobe, right thalamic infarct Labs and images independently reviewed. Records reviewed and summated above. Stroke: Continue secondary stroke prophylaxis and Risk Factor Modification listed below:  Antiplatelet therapy:  Blood Pressure Management: Continue current medication with prn's with permisive HTN per primary team Statin Agent:  Pre-iabetes management:  Motor recovery: Fluoxetine  1. Does the need for close, 24 hr/day medical supervision in concert with the patient's rehab needs make it unreasonable for this patient to be served in a less intensive setting? Potentially  2. Co-Morbidities requiring supervision/potential  complications: asthma (Cont meds, monitor O2 sats with increased activity), venous insufficiency and previous CVA with right-sided residual weakness  3. Due to safety, disease management, medication administration and patient education, does the patient require 24 hr/day rehab nursing? Potentially 4. Does the patient require coordinated care of a physician, rehab nurse, PT (1-2 hrs/day, 5 days/week) and OT (1-2 hrs/day, 5 days/week) to address physical and functional deficits in the context of the above medical diagnosis(es)? Yes Addressing deficits in the following areas: balance, endurance, locomotion, strength, transferring, bathing, dressing, toileting and psychosocial support 5. Can the patient actively participate in an intensive therapy program of at least 3 hrs of therapy per day at least 5 days per week? Yes 6. The potential for patient to make measurable gains while on inpatient rehab is excellent 7. Anticipated functional outcomes upon discharge from inpatient rehab are modified independent and supervision with PT, modified independent and supervision with OT, n/a with SLP. 8. Estimated rehab length of stay to reach the above functional goals is: 10-14 days. 9. Does the patient have adequate social supports and living environment to accommodate these discharge functional goals? Yes 10. Anticipated D/C setting: Home 11. Anticipated post D/C treatments: HH therapy and Home excercise program 12. Overall Rehab/Functional Prognosis: good  RECOMMENDATIONS: This patient's condition is appropriate for continued rehabilitative care in the following setting: CIR Patient has agreed to participate in recommended program. Yes Note that insurance prior authorization may be required for reimbursement for recommended care.  Comment: Rehab Admissions Coordinator to follow up.  Delice Lesch, MD 09/12/2015       Revision History     Date/Time User Provider Type Action   09/12/2015 3:59 PM Ankit  Lorie Phenix, MD Physician Sign   09/12/2015 1:56 PM Cathlyn Parsons, PA-C Physician Assistant Pend   View Details Report       Routing History     Date/Time From To Method   09/12/2015 3:59 PM Ankit Lorie Phenix, MD Ankit Lorie Phenix, MD In [facility]   09/12/2015 3:59 PM Ankit Lorie Phenix, MD Hulan Fess, MD Fax

## 2015-09-14 ENCOUNTER — Inpatient Hospital Stay (HOSPITAL_COMMUNITY): Payer: Medicare Other | Admitting: Physical Therapy

## 2015-09-14 ENCOUNTER — Inpatient Hospital Stay (HOSPITAL_COMMUNITY): Payer: Medicare Other

## 2015-09-14 DIAGNOSIS — J4550 Severe persistent asthma, uncomplicated: Secondary | ICD-10-CM | POA: Insufficient documentation

## 2015-09-14 DIAGNOSIS — I6930 Unspecified sequelae of cerebral infarction: Secondary | ICD-10-CM

## 2015-09-14 LAB — COMPREHENSIVE METABOLIC PANEL
ALBUMIN: 2.7 g/dL — AB (ref 3.5–5.0)
ALT: 15 U/L (ref 14–54)
ANION GAP: 10 (ref 5–15)
AST: 18 U/L (ref 15–41)
Alkaline Phosphatase: 44 U/L (ref 38–126)
BUN: 13 mg/dL (ref 6–20)
CO2: 25 mmol/L (ref 22–32)
CREATININE: 1.05 mg/dL — AB (ref 0.44–1.00)
Calcium: 8.5 mg/dL — ABNORMAL LOW (ref 8.9–10.3)
Chloride: 109 mmol/L (ref 101–111)
GFR calc Af Amer: 52 mL/min — ABNORMAL LOW (ref >60)
GFR calc non Af Amer: 45 mL/min — ABNORMAL LOW (ref >60)
GLUCOSE: 76 mg/dL (ref 65–99)
POTASSIUM: 3.8 mmol/L (ref 3.5–5.1)
SODIUM: 144 mmol/L (ref 135–145)
Total Bilirubin: 0.6 mg/dL (ref 0.3–1.2)
Total Protein: 5.3 g/dL — ABNORMAL LOW (ref 6.5–8.1)

## 2015-09-14 LAB — CBC WITH DIFFERENTIAL/PLATELET
BASOS ABS: 0 10*3/uL (ref 0.0–0.1)
Basophils Relative: 0 %
EOS PCT: 6 %
Eosinophils Absolute: 0.4 10*3/uL (ref 0.0–0.7)
HCT: 40.6 % (ref 36.0–46.0)
HEMOGLOBIN: 13.1 g/dL (ref 12.0–15.0)
LYMPHS ABS: 2.7 10*3/uL (ref 0.7–4.0)
LYMPHS PCT: 39 %
MCH: 28.4 pg (ref 26.0–34.0)
MCHC: 32.3 g/dL (ref 30.0–36.0)
MCV: 87.9 fL (ref 78.0–100.0)
Monocytes Absolute: 0.7 10*3/uL (ref 0.1–1.0)
Monocytes Relative: 9 %
NEUTROS ABS: 3.3 10*3/uL (ref 1.7–7.7)
Neutrophils Relative %: 46 %
Platelets: 233 10*3/uL (ref 150–400)
RBC: 4.62 MIL/uL (ref 3.87–5.11)
RDW: 15.9 % — AB (ref 11.5–15.5)
WBC: 7.1 10*3/uL (ref 4.0–10.5)

## 2015-09-14 LAB — HEPATIC FUNCTION PANEL: BILIRUBIN, TOTAL: 0.6 mg/dL

## 2015-09-14 MED ORDER — ALBUTEROL SULFATE (2.5 MG/3ML) 0.083% IN NEBU
3.0000 mL | INHALATION_SOLUTION | Freq: Four times a day (QID) | RESPIRATORY_TRACT | Status: DC | PRN
  Administered 2015-09-16 – 2015-09-24 (×4): 3 mL via RESPIRATORY_TRACT
  Filled 2015-09-14 (×4): qty 3

## 2015-09-14 NOTE — Care Management Note (Signed)
Inpatient Rehabilitation Center Individual Statement of Services  Patient Name:  Darlene Murray  Date:  09/14/2015  Welcome to the [facility].  Our goal is to provide you with an individualized program based on your diagnosis and situation, designed to meet your specific needs.  With this comprehensive rehabilitation program, you will be expected to participate in at least 3 hours of rehabilitation therapies Monday-Friday, with modified therapy programming on the weekends.  Your rehabilitation program will include the following services:  Physical Therapy (PT), Occupational Therapy (OT), Speech Therapy (ST), 24 hour per day rehabilitation nursing, Therapeutic Recreaction (TR), Case Management (Social Worker), Rehabilitation Medicine, Nutrition Services and Pharmacy Services  Weekly team conferences will be held on Wednesday to discuss your progress.  Your Social Worker will talk with you frequently to get your input and to update you on team discussions.  Team conferences with you and your family in attendance may also be held.  Expected length of stay: 10-14 days  Overall anticipated outcome: min-some mod assist level  Depending on your progress and recovery, your program may change. Your Social Worker will coordinate services and will keep you informed of any changes. Your Social Worker's name and contact numbers are listed  below.  The following services may also be recommended but are not provided by the [facility]:    [place] will be made to provide these services after discharge if needed.  Arrangements include referral to agencies that provide these services.  Your insurance has been verified to be:  [employer] Your primary doctor is:  Hulan Fess  Pertinent information will be shared with your doctor and your insurance company.  Social Worker:  Ovidio Kin, [place] or ([phone]  Information discussed with and copy given to patient by: [name], 09/14/2015, 12:58 PM

## 2015-09-14 NOTE — Plan of Care (Signed)
Problem: RH Bed Mobility Goal: LTG Patient will perform bed mobility with assist (PT) LTG: Patient will perform bed mobility with assistance, with/without cues (PT). Without bed features  Problem: RH Bed to Chair Transfers Goal: LTG Patient will perform bed/chair transfers w/assist (PT) LTG: Patient will perform bed/chair transfers with assistance, with/without cues (PT). With LRAD  Problem: RH Car Transfers Goal: LTG Patient will perform car transfers with assist (PT) LTG: Patient will perform car transfers with assistance (PT). With LRAD  Problem: RH Furniture Transfers Goal: LTG Patient will perform furniture transfers w/assist (OT/PT LTG: Patient will perform furniture transfers with assistance (OT/PT). With LRAD  Problem: RH Ambulation Goal: LTG Patient will ambulate in home environment (PT) LTG: Patient will ambulate in home environment, # of feet with assistance (PT). With LRAD  Problem: RH Stairs Goal: LTG Patient will ambulate up and down stairs w/assist (PT) LTG: Patient will ambulate up and down # of stairs with assistance (PT) With LRAD & Bilateral railings for LE strengthening

## 2015-09-14 NOTE — Progress Notes (Signed)
Physical Therapy Session Note  Patient Details  Name: Darlene Murray MRN: [id number] Date of Birth: Aug 18, 1921  Today's Date: 09/14/2015 PT Individual Time: 1530-1600 PT Individual Time Calculation (min): 30 min   Short Term Goals: Week 1:  PT Short Term Goal 1 (Week 1): Pt will be able to ambulate 15 ft with LRAD & Mod A. PT Short Term Goal 2 (Week 1): Pt will complete squat pivot transfers with Min A & LRAD. PT Short Term Goal 3 (Week 1): Pt will perform sit<>stand transfers with Min A.  Skilled Therapeutic Interventions/Progress Updates:    Pt received resting in w/c with husband present, no c/o pain, and agreeable to therapy session.  Session focus on attention to task, initiation, following multistep directions, and standing tolerance.  Pt able to complete simple sorting task with mod verbal cues fade to supervision from a field of 3 progressing to a field of 8.  Pt able to replace game pieces back into container in patterns provided by therapist with supervision.  Sit<>stand in standing frame with max assist, pt noted to be fully weight bearing through LEs and not resting on leg pads or harness in standing frame so tension released.  Pt relying increasingly on UE support at standing frame for simple cup stacking task, again focus on following multi-step directions for specific patterns.  Pt returned to room at end of session and left in w/c with call bell in reach and needs met.   Therapy Documentation Precautions:  Precautions Precautions: Cervical, Fall Restrictions Weight Bearing Restrictions: No   See Function Navigator for Current Functional Status.   Therapy/Group: Individual Therapy  Earnest Conroy Penven-Crew 09/14/2015, 4:54 PM

## 2015-09-14 NOTE — Evaluation (Signed)
Physical Therapy Assessment and Plan  Patient Details  Name: Darlene Murray MRN: [id number] Date of Birth: 28-Jan-1922  PT Diagnosis: Abnormality of gait, Cognitive deficits, Difficulty walking, Impaired cognition and Muscle weakness Rehab Potential: Good ELOS: 10-14 days   Today's Date: 09/14/2015 PT Individual Time: 1000-1100 and 1418-1500 PT Individual Time Calculation (min): 60 min and 42 minutes    Problem List:  Patient Active Problem List   Diagnosis Date Noted  . Asthma, severe persistent   . Thalamic infarct, acute ([place]) 09/13/2015  . Acute left hemiparesis ([place])   . Right-sided lacunar infarction ([place])   . History of CVA with residual deficit   . Asthma, moderate persistent   . Left-sided weakness 09/11/2015  . Cerebral infarction due to unspecified mechanism   . Acromioclavicular joint arthritis 08/20/2015  . Acute prerenal azotemia 08/02/2015  . CKD (chronic kidney disease), stage III 08/02/2015  . Memory loss 06/15/2015  . Acute right-sided weakness 06/14/2015  . Spondylosis, cervical, with myelopathy 06/13/2015  . Ataxia S/P CVA 05/17/2015  . Gait disturbance, post-stroke 05/17/2015  . Sepsis ([place]) 04/23/2015  . Urinary tract infection 04/23/2015  . Acute hemorrhagic infarction of brain ([place]) 04/10/2015  . Ataxia   . History of mixed hemorraghic embolic stroke 75/04/2584  . Weakness 04/06/2015  . Generalized weakness 04/06/2015  . Nausea with vomiting 04/06/2015  . Left ventricular diastolic dysfunction with preserved systolic function ([place]) 27/78/2423  . Severe persistent asthma in adult steroid dependent   . Allergic rhinitis   . Hyperlipidemia   . Venous insufficiency   . HNP (herniated nucleus pulposus), lumbar   . Left ventricular hypertrophy     Past Medical History:  Past Medical History  Diagnosis Date  . Blindness   . Asthmatic bronchitis   . Asthma   . Fibrocystic breast disease   . Allergic rhinitis   . Hyperlipidemia   . Venous  insufficiency     lower extremities  . HNP (herniated nucleus pulposus), lumbar     L4-L5  . Left ventricular hypertrophy 2008    mild  . Antibiotic-associated diarrhea   . Stroke (cerebrum) [facility])    Past Surgical History:  Past Surgical History  Procedure Laterality Date  . Cataract extraction    . Colonoscopy      Assessment & Plan Clinical Impression: Patient is a 82 y.o. year old female, right handed, with history asthma maintained on prednisone, venous insufficiency and previous CVA with right-sided residual weakness and received inpatient rehabilitation services October 2016. Patient lives with husband. She was able to ambulate short distances with a walker but primarily stayed in a wheelchair and was receiving home health therapies. Husband has been assisting minimally for showering toileting and dressing. One level home with level entry. Presented 09/11/2015 with left-sided weakness and slurred speech. MRI of the brain shows acute nonhemorrhagic infarct right thalamus and posterior limb right internal capsule. Tiny acute medial left occipital lobe infarct. Remote bilateral cerebellar infarcts. MRA MRI of the head and neck showed moderate to marked narrowing proximal right vertebral artery as well as right subclavian artery and left subclavian artery. Echocardiogram pending. Patient did not receive TPA. Neurology consulted and currently maintained on aspirin and Plavix for CVA prophylaxis. Subcutaneous Lovenox for DVT prophylaxis. Tolerating a regular diet. Patient was admitted for comprehensive rehabilitation program.  Patient transferred to CIR on 09/13/2015 .   Patient currently requires max with mobility secondary to muscle weakness, impaired balance and decreased awareness, decreased safety awareness and decreased memory.  Prior  to hospitalization, patient was supervision with mobility (ambulating with RW & use of w/c, for household distances) and lived with Spouse in a House home.   Home access is  Level entry.  Patient will benefit from skilled PT intervention to maximize safe functional mobility, minimize fall risk and decrease caregiver burden for planned discharge home with 24 hour supervision.  Anticipate patient will benefit from follow up [place] at discharge.  PT - End of Session Activity Tolerance: Tolerates 30+ min activity with multiple rests Endurance Deficit: Yes PT Assessment Rehab Potential (ACUTE/IP ONLY): Good PT Patient demonstrates impairments in the following area(s): Balance;Behavior;Pain;Safety;Endurance PT Transfers Functional Problem(s): Bed Mobility;Bed to Chair;Car;Furniture PT Locomotion Functional Problem(s): Wheelchair Mobility;Ambulation;Stairs PT Plan PT Intensity: Minimum of 1-2 x/day ,45 to 90 minutes PT Frequency: 5 out of 7 days PT Duration Estimated Length of Stay: 10-14 days PT Treatment/Interventions: Training and development officer;Ambulation/gait training;Discharge planning;DME/adaptive equipment instruction;Functional mobility training;Pain management;Psychosocial support;Splinting/orthotics;Therapeutic Activities;UE/LE Strength taining/ROM;Wheelchair propulsion/positioning;UE/LE Coordination activities;Therapeutic Exercise;Stair training;Neuromuscular re-education;Patient/family education PT Transfers Anticipated Outcome(s): Min A for transfers PT Locomotion Anticipated Outcome(s): s for w/c mobility, Min A for ambulation PT Recommendation Recommendations for Other Services: Speech consult Follow Up Recommendations: Home health PT Patient destination: Home Equipment Recommended: None recommended by PT  Skilled Therapeutic Intervention Treatment 1: Pt received in w/c & agreeable to PT. Pt only oriented to self & location, with PT reorienting pt; pt also able to recall correct home set up per chart. Pt completed squat pivot transfer w/c>bed requiring max verbal & tactile cuing to initiate movement & to weight shift. Pt demonstrated good  sitting balance at EOB & transferred supine<>sit with bed rails & S, rolled R with S and required Mod A to roll to L, as well as max cuing and tactile assistance for RUE placement on bed rail. Pt then performed squat pivot transfer bed>w/c in same manner as noted above. Pt propelled w/c with BUE for 85 ft with supervision A. Pt consistently asking for husband, Jenny Reichmann, throughout session. PT provided pt with smaller w/c to allow more comfortable fit for pt. Pt able to transfer sit>stand with max A & max cuing for hand placement to push up from w/c. Pt required max cuing to perform anterior weight shift to increase ease of transfer. Pt able to ambulate 3 ft + 5 ft with RW & w/c follow. Pt with significant lateral lean/push to L requiring Max A for balance & stability. Pt also required max A to weight shift and advance BLE feet forward. Pt requesting sitting breaks multiple times throughout ambulation & required motivation/encouragement from PT. At end of session pt returned to room in w/c & left with all needs met, call bell within reach, and QRB in place.   Treatment 2: Pt received in bed & agreeable to PT. Husband, Jenny Reichmann, present reporting pt was able to ambulate to dining room table with RW at baseline, as well as use w/c without assistance. Pt did have a caregiver coming in 1-2x/wk to help her with bathing, otherwise pt was able to complete bed mobility & transfers without physical assistance. Husband also reports pt is demonstrating impaired cognition following this stroke, as compared to her baseline (pt still only oriented to self & location this afternoon). Pt transported to ortho gym, where she completed sit>stand transfer requiring tactile cuing to initiate task and for upright posture. Pt ambulated 5 ft w/c>car with RW and Max A for upright posture as pt demonstrated forward flexion & L lateral lean which limited her ability to  weight-shift and advance LE's. Pt able to transfer LE's in/out of car with Mod A,  then transferred back to w/c in same manner as noted above. Pt then negotiated 4 steps with B railings, leading with RLE for ascension and requiring maximum verbal cuing for sequencing & technique. Pt continued to be limited by L lateral lean during task. After task pt's HR = 79 bpm, SpO2 = 100%, BP = 127/59 mmHg. Pt then returned to room & left in w/c with QRB in place, call bell within reach, & husband present to supervise.  PT Evaluation Precautions/Restrictions Precautions Precautions: Cervical;Fall Restrictions Weight Bearing Restrictions: No General Chart Reviewed: Yes Family/Caregiver Present: No Vital SignsTherapy Vitals Pulse Rate: 64 BP: 119/85 mmHg Patient Position (if appropriate): Sitting Oxygen Therapy SpO2: 100 % O2 Device: Not Delivered Pulse Oximetry Type: Intermittent Pain Pain Assessment Pain Assessment: No/denies pain Pain Score: 0-No pain Home Living/Prior Functioning Home Living Available Help at Discharge: Family Type of Home: House Home Access: Level entry Home Layout: One level  Lives With: Spouse Vision/Perception     Cognition Overall Cognitive Status: Impaired from baseline (per husband's report) Arousal/Alertness: Awake/alert Orientation Level: Oriented to person;Oriented to place Attention: Selective Sustained Attention: Appears intact Selective Attention: Appears intact Memory: Impaired Memory Impairment: Decreased recall of new information Awareness: Appears intact Awareness Impairment: Emergent impairment Problem Solving: Impaired Problem Solving Impairment: Functional complex Executive Function: Initiating;Self Monitoring;Self Correcting Initiating: Impaired Initiating Impairment: Functional complex Self Monitoring: Impaired Self Monitoring Impairment: Functional complex Self Correcting: Impaired Self Correcting Impairment: Functional complex Safety/Judgment: Impaired Sensation Sensation Light Touch: Appears Intact  (BLE) Stereognosis: Appears Intact Hot/Cold: Appears Intact Proprioception: Appears Intact (BLE) Coordination Gross Motor Movements are Fluid and Coordinated: No (residual incorrdination of RUE from prior CVA) Fine Motor Movements are Fluid and Coordinated: No (residual FMC impairment from prior CVA) Heel Shin Test:  (BLE equal, BLE minimally impaired) Motor  Motor Motor: Hemiplegia Motor - Skilled Clinical Observations:  (negative clonus BLE)  Mobility Bed Mobility Bed Mobility: Rolling Right;Rolling Left;Supine to Sit;Sit to Supine Rolling Right: 5: Supervision (bed rails) Rolling Left: 4: Min assist (bed rails) Rolling Left Details: Verbal cues for technique;Tactile cues for placement Supine to Sit: 5: Supervision (bed rails) Sit to Supine: 5: Supervision (bed rails) Transfers Transfers: Yes Sit to Stand: With armrests;2: Max assist Sit to Stand Details: Tactile cues for sequencing;Verbal cues for sequencing;Verbal cues for technique;Tactile cues for placement Stand to Sit: 2: Max assist;With armrests Stand to Sit Details (indicate cue type and reason): Tactile cues for sequencing;Tactile cues for placement Squat Pivot Transfers: 2: Max Risk manager Details: Tactile cues for sequencing;Tactile cues for initiation;Tactile cues for weight shifting;Verbal cues for sequencing;Verbal cues for technique Locomotion  Ambulation Ambulation: Yes Ambulation/Gait Assistance: 5: Supervision Ambulation Distance (Feet): 5 Feet Assistive device: Rolling walker Ambulation/Gait Assistance Details: Manual facilitation for weight shifting;Manual facilitation for placement;Tactile cues for initiation;Tactile cues for weight shifting;Verbal cues for sequencing;Verbal cues for technique Gait Gait: Yes Gait Pattern: Impaired (significant pushing/lateral lean to L) Gait Pattern: Decreased hip/knee flexion - left;Decreased step length - right;Decreased step length - left Stairs /  Additional Locomotion Stairs: No Wheelchair Mobility Wheelchair Mobility: Yes Wheelchair Assistance: 5: Supervision Wheelchair Propulsion: Both upper extremities Distance: 85 ft  Trunk/Postural Assessment  Postural Control Postural Control: Within Functional Limits  Balance Balance Balance Assessed: Yes Static Sitting Balance Static Sitting - Balance Support: No upper extremity supported;Feet supported Static Sitting - Level of Assistance: 6: Modified independent (Device/Increase time) Dynamic Sitting Balance  Dynamic Sitting - Balance Support: Feet supported;No upper extremity supported Dynamic Sitting - Level of Assistance: 5: Stand by assistance Static Standing Balance Static Standing - Balance Support: Bilateral upper extremity supported;During functional activity Static Standing - Level of Assistance: 2: Max assist Extremity Assessment      RLE Assessment RLE Assessment: Within Functional Limits (grossly 4/5) LLE Assessment LLE Assessment: Within Functional Limits (grossly 4/5)   See Function Navigator for Current Functional Status.   Refer to Care Plan for Long Term Goals  Recommendations for other services: None  Discharge Criteria: Patient will be discharged from PT if patient refuses treatment 3 consecutive times without medical reason, if treatment goals not met, if there is a change in medical status, if patient makes no progress towards goals or if patient is discharged from hospital.  The above assessment, treatment plan, treatment alternatives and goals were discussed and mutually agreed upon: by patient  Waunita Schooner 09/14/2015, 12:33 PM

## 2015-09-14 NOTE — Progress Notes (Signed)
Social Work  Social Work Assessment and Plan  Patient Details  Name: Darlene Murray MRN: [id number] Date of Birth: 1922-03-02  Today's Date: 09/14/2015  Problem List:  Patient Active Problem List   Diagnosis Date Noted  . Asthma, severe persistent   . Thalamic infarct, acute ([place]) 09/13/2015  . Acute left hemiparesis ([place])   . Right-sided lacunar infarction ([place])   . History of CVA with residual deficit   . Asthma, moderate persistent   . Left-sided weakness 09/11/2015  . Cerebral infarction due to unspecified mechanism   . Acromioclavicular joint arthritis 08/20/2015  . Acute prerenal azotemia 08/02/2015  . CKD (chronic kidney disease), stage III 08/02/2015  . Memory loss 06/15/2015  . Acute right-sided weakness 06/14/2015  . Spondylosis, cervical, with myelopathy 06/13/2015  . Ataxia S/P CVA 05/17/2015  . Gait disturbance, post-stroke 05/17/2015  . Sepsis ([place]) 04/23/2015  . Urinary tract infection 04/23/2015  . Acute hemorrhagic infarction of brain ([place]) 04/10/2015  . Ataxia   . History of mixed hemorraghic embolic stroke [id number]  . Weakness 04/06/2015  . Generalized weakness 04/06/2015  . Nausea with vomiting 04/06/2015  . Left ventricular diastolic dysfunction with preserved systolic function ([place]) [id number]  . Severe persistent asthma in adult steroid dependent   . Allergic rhinitis   . Hyperlipidemia   . Venous insufficiency   . HNP (herniated nucleus pulposus), lumbar   . Left ventricular hypertrophy    Past Medical History:  Past Medical History  Diagnosis Date  . Blindness   . Asthmatic bronchitis   . Asthma   . Fibrocystic breast disease   . Allergic rhinitis   . Hyperlipidemia   . Venous insufficiency     lower extremities  . HNP (herniated nucleus pulposus), lumbar     L4-L5  . Left ventricular hypertrophy 2008    mild  . Antibiotic-associated diarrhea   . Stroke (cerebrum) [facility])    Past Surgical History:  Past Surgical History  Procedure  Laterality Date  . Cataract extraction    . Colonoscopy     Social History:  reports that she has been passively smoking.  She has never used smokeless tobacco. She reports that she does not drink alcohol or use illicit drugs.  Family / Support Systems Marital Status: Married How Long?: 11 years Patient Roles: Spouse, Parent Spouse/Significant Other: Darlene Murray  [phone] Children: Darlene Murray  [id number] Other Supports: friends and church members Anticipated Caregiver: husband  Ability/Limitations of Caregiver: Husband has been assisting pt prior to admission, son visits frequently Caregiver Availability: 24/7 Family Dynamics: Close knit family pt and husband moved down here from [place] to be close to pt's son. Pt had been very active but has slowed down after her first stroke and now she has had multiple strokes. She does have church members who are supportive and visit her.  Social History Preferred language: English Religion: Baptist Cultural Background: No issues Education: Education officer, museum Read: Yes Write: Yes Employment Status: Retired Freight forwarder Issues: No issues Guardian/Conservator: None-according to MD pt is capable of making her own decisions while here. Will make sure husband is aware as pt is at times confused.   Abuse/Neglect Physical Abuse: Denies Verbal Abuse: Denies Sexual Abuse: Denies Exploitation of patient/patient's resources: Denies Self-Neglect: Denies  Emotional Status Pt's affect, behavior adn adjustment status: Pt is wanting to be as independent as possible, her husband helped her with ADL's but she could ambulate with her walker according to her prior to admission here. he  was also getting HHPT prior to coming in. She tries to be optimistic and eager to do well here. Recent Psychosocial Issues: history of multiple strokes-does well then has another one. Pyschiatric History: No history  deferred depression screen due to pt adjusting to rehab and first day here. Will follow along and see if team feels she would benefit from being seen by neuro-psych while here. Substance Abuse History: No issues  Patient / Family Perceptions, Expectations & Goals Pt/Family understanding of illness & functional limitations: Pt and husband can explain her stroke, both have spoken with the MD and feel thier questions and concerns are being addressed. Pt is hopeful she will improve and get to where she was before this happened. Husband is a strong support of her. Premorbid pt/family roles/activities: Wife, Mother, church member, etc Anticipated changes in roles/activities/participation: resume Pt/family expectations/goals: Pt states: " I hope to get back to where I was before this stroke."  Husband states: " I hope she can be walking some like before."  [employer]: Other (Comment) Premorbid Home Care/DME Agencies: Other (Comment) (Active pt of [facility]) Transportation available at discharge: Husband and son Resource referrals recommended: Support group (specify)  Discharge Planning Living Arrangements: Spouse/significant other Support Systems: Spouse/significant other, Children, Church/faith community, Friends/neighbors Type of Residence: Private residence Insurance Resources: [employer], Multimedia programmer (specify) Herbalist) Financial Resources: Fish farm manager, Family Support Financial Screen Referred: No Living Expenses: Lives with family Money Management: Spouse Does the patient have any problems obtaining your medications?: No Home Management: Husband and has hired Diplomatic Services operational officer Plans: Return home with husband who has been Designer, multimedia with her  ADL's prior to this admission. Both are hopeful she will get to where she is ambulating household distances ike she was prior to admission. Familiar with rehab program was here in 04/2015. Social Work  Anticipated Follow Up Needs: HH/OP, Support Group  Clinical Impression Unfortunate lady who has had multiple strokes she recovers some then has another one. She is still optimistic and hoping to do well here, familiar with CIR and is glad to be here. Her husband and son are very involved and Willing to assist her at home. Husband was assisting prior to admission here. Will work on discharge plans and resume [facility] services upon discharge.  [name] 09/14/2015, 1:15 PM

## 2015-09-14 NOTE — Progress Notes (Addendum)
[facility] PHYSICAL MEDICINE & REHABILITATION     PROGRESS NOTE  Subjective/Complaints:  Patient states he slept well overnight. She is a little confused earlier this a.m., but appears to be improving.  ROS: Denies CP, SOB, N/V/D  Objective: Vital Signs: Blood pressure 103/70, pulse 65, temperature 97.7 F (36.5 C), temperature source Oral, resp. rate 17, height 5\' 4"  (1.626 m), weight 73.029 kg (161 lb), SpO2 97 %. No results found.  Recent Labs  09/13/15 1709 09/14/15 0440  WBC 6.9 7.1  HGB 13.5 13.1  HCT 41.7 40.6  PLT 229 233    Recent Labs  09/13/15 1709 09/14/15 0440  NA  --  144  K  --  3.8  CL  --  109  GLUCOSE  --  76  BUN  --  13  CREATININE 1.19* 1.05*  CALCIUM  --  8.5*   CBG (last 3)  No results for input(s): GLUCAP in the last 72 hours.  Wt Readings from Last 3 Encounters:  09/13/15 73.029 kg (161 lb)  08/02/15 72.576 kg (160 lb)  06/27/15 73.71 kg (162 lb 8 oz)    Physical Exam:  BP 103/70 mmHg  Pulse 65  Temp(Src) 97.7 F (36.5 C) (Oral)  Resp 17  Ht 5\' 4"  (1.626 m)  Wt 73.029 kg (161 lb)  BMI 27.62 kg/m2  SpO2 97% Constitutional: She appears well-developed and well-nourished. NAD. Vital signs reviewed.  HENT: Normocephalic and atraumatic.  Eyes: Conjunctivae and EOM are normal. No scleral icterus.   Cardiovascular: Normal rate and regular rhythm.  Respiratory: Effort normal and breath sounds normal. No respiratory distress.  GI: Soft. Bowel sounds are normal. She exhibits no distension.  Musculoskeletal: She exhibits no edema or tenderness.  Neurological:  She is alert and oriented x3 with increased time and cues Left lean Patient able to provide her name and age.  Follows simple commands. Motor: LUE/LLE: 5/5 RUE/RLE: 4+/5 proximal to distal grossly  Skin: Skin is warm and dry.  Psychiatric: She has a normal mood and affect. Her behavior is normal. Thought content normal  Assessment/Plan: 1. Functional deficits secondary  to right posterior limb and left occipital right thalamic CVA with history of CVA 2016 and right side residual weakness which require 3+ hours per day of interdisciplinary therapy in a comprehensive inpatient rehab setting. Physiatrist is providing close team supervision and 24 hour management of active medical problems listed below. Physiatrist and rehab team continue to assess barriers to discharge/monitor patient progress toward functional and medical goals.  Function:  Bathing Bathing position   Position: Wheelchair/chair at sink  Bathing parts Body parts bathed by patient: Right arm, Left arm, Chest, Abdomen, Right upper leg, Left upper leg Body parts bathed by helper: Front perineal area, Buttocks, Right lower leg, Left lower leg, Back  Bathing assist Assist Level: Touching or steadying assistance(Pt > 75%)      Upper Body Dressing/Undressing Upper body dressing                    Upper body assist        Lower Body Dressing/Undressing Lower body dressing   What is the patient wearing?: Underwear, Pants, Ted Hose, Non-skid slipper socks Underwear - Performed by patient: Thread/unthread right underwear leg, Thread/unthread left underwear leg Underwear - Performed by helper: Pull underwear up/down Pants- Performed by patient: Thread/unthread right pants leg, Thread/unthread left pants leg Pants- Performed by helper: Pull pants up/down Non-skid slipper socks- Performed by patient: Don/doff right sock,  Don/doff left sock                 TED Hose - Performed by helper: Don/doff right TED hose, Don/doff left TED hose  Lower body assist Assist for lower body dressing: Touching or steadying assistance (Pt > 75%)      Toileting Toileting     Toileting steps completed by helper: Adjust clothing prior to toileting, Performs perineal hygiene, Adjust clothing after toileting    Toileting assist Assist level: Touching or steadying assistance (Pt.75%)    Transfers Chair/bed transfer   Chair/bed transfer method: Lateral scoot Chair/bed transfer assist level: Maximal assist (Pt 25 - 49%/lift and lower) Chair/bed transfer assistive device: Bedrails, Armrests     Locomotion Ambulation           Wheelchair          Cognition Comprehension Comprehension assist level: Understands complex 90% of the time/cues 10% of the time  Expression Expression assist level: Expresses complex 90% of the time/cues < 10% of the time  Social Interaction Social Interaction assist level: Interacts appropriately 90% of the time - Needs monitoring or encouragement for participation or interaction.  Problem Solving Problem solving assist level: Solves basic 75 - 89% of the time/requires cueing 10 - 24% of the time  Memory Memory assist level: Recognizes or recalls 50 - 74% of the time/requires cueing 25 - 49% of the time      Medical Problem List and Plan: 1. Left-sided hemiparesis with dysarthria secondary to right posterior limb and left occipital right thalamic CVA with history of CVA 2016 and right side residual weakness  Begin CIR 2. DVT Prophylaxis/Anticoagulation: Subcutaneous Lovenox. Monitor platelet counts and any signs of bleeding 3. Pain Management: Tylenol as needed 4. Asthma. Continue chronic prednisone. Continue Dulera 5. Neuropsych: This patient is capable of making decisions on her own behalf. 6. Skin/Wound Care: Routine skin checks 7. Fluids/Electrolytes/Nutrition: Routine I&O   BMP within acceptable range on 3/10  8. Hyperlipidemia. Pravachol 9. Legally blind right eye  LOS (Days) 1 A FACE TO FACE EVALUATION WAS PERFORMED  Darlene Murray Lorie Phenix 09/14/2015 10:12 AM

## 2015-09-14 NOTE — Progress Notes (Signed)
IV removed from Patient's right hand, no complications

## 2015-09-14 NOTE — Evaluation (Signed)
Occupational Therapy Assessment and Plan  Patient Details  Name: Darlene Murray MRN: [id number] Date of Birth: 06/26/1922  OT Diagnosis: hemiplegia affecting non-dominant side Rehab Potential: Rehab Potential (ACUTE ONLY): Good ELOS: 10-14 days   Today's Date: 09/14/2015 OT Individual Time:0730-0830 60 Minutes        Problem List:  Patient Active Problem List   Diagnosis Date Noted  . Asthma, severe persistent   . Thalamic infarct, acute ([place]) 09/13/2015  . Acute left hemiparesis ([place])   . Right-sided lacunar infarction ([place])   . History of CVA with residual deficit   . Asthma, moderate persistent   . Left-sided weakness 09/11/2015  . Cerebral infarction due to unspecified mechanism   . Acromioclavicular joint arthritis 08/20/2015  . Acute prerenal azotemia 08/02/2015  . CKD (chronic kidney disease), stage III 08/02/2015  . Memory loss 06/15/2015  . Acute right-sided weakness 06/14/2015  . Spondylosis, cervical, with myelopathy 06/13/2015  . Ataxia S/P CVA 05/17/2015  . Gait disturbance, post-stroke 05/17/2015  . Sepsis ([place]) 04/23/2015  . Urinary tract infection 04/23/2015  . Acute hemorrhagic infarction of brain ([place]) 04/10/2015  . Ataxia   . History of mixed hemorraghic embolic stroke 95/28/4132  . Weakness 04/06/2015  . Generalized weakness 04/06/2015  . Nausea with vomiting 04/06/2015  . Left ventricular diastolic dysfunction with preserved systolic function ([place]) 44/07/270  . Severe persistent asthma in adult steroid dependent   . Allergic rhinitis   . Hyperlipidemia   . Venous insufficiency   . HNP (herniated nucleus pulposus), lumbar   . Left ventricular hypertrophy     Past Medical History:  Past Medical History  Diagnosis Date  . Blindness   . Asthmatic bronchitis   . Asthma   . Fibrocystic breast disease   . Allergic rhinitis   . Hyperlipidemia   . Venous insufficiency     lower extremities  . HNP (herniated nucleus pulposus), lumbar     L4-L5   . Left ventricular hypertrophy 2008    mild  . Antibiotic-associated diarrhea   . Stroke (cerebrum) [facility])    Past Surgical History:  Past Surgical History  Procedure Laterality Date  . Cataract extraction    . Colonoscopy      Assessment & Plan Clinical Impression: Patient is a 82 y.o. right handed female with history asthma maintained on prednisone, venous insufficiency and previous CVA with right-sided residual weakness and received inpatient rehabilitation services October 2016. Patient lives with husband. She was able to ambulate short distances with a walker but primarily stayed in a wheelchair and was receiving home health therapies. Husband has been assisting minimally for showering toileting and dressing. One level home with level entry. Presented 09/11/2015 with left-sided weakness and slurred speech. MRI of the brain shows acute nonhemorrhagic infarct right thalamus and posterior limb right internal capsule. Tiny acute medial left occipital lobe infarct. Remote bilateral cerebellar infarcts. MRA MRI of the head and neck showed moderate to marked narrowing proximal right vertebral artery as well as right subclavian artery and left subclavian artery. Echocardiogram pending. Patient did not receive TPA. Neurology consulted and currently maintained on aspirin and Plavix for CVA prophylaxis. Subcutaneous Lovenox for DVT prophylaxis. Tolerating a regular diet.  Patient transferred to CIR on 09/13/2015 .    Patient currently requires max with basic self-care skills secondary to decreased coordination and decreased motor planning.  Prior to hospitalization, patient could complete BADL with minimal assist.  Patient will benefit from skilled intervention to decrease level of assist with basic self-care  skills prior to discharge home with care partner.  Anticipate patient will require minimal physical assistance and follow up home health.  OT - End of Session Activity Tolerance: Tolerates 30+  min activity with multiple rests Endurance Deficit: Yes OT Assessment Rehab Potential (ACUTE ONLY): Good OT Patient demonstrates impairments in the following area(s): Balance;Endurance;Motor;Safety OT Basic ADL's Functional Problem(s): Grooming;Bathing;Dressing;Toileting OT Transfers Functional Problem(s): Toilet;Tub/Shower OT Additional Impairment(s): None OT Plan OT Intensity: Minimum of 1-2 x/day, 45 to 90 minutes OT Frequency: 5 out of 7 days OT Duration/Estimated Length of Stay: 10-14 days OT Treatment/Interventions: Balance/vestibular training;Discharge planning;Functional mobility training;Patient/family education;Therapeutic Exercise;Therapeutic Activities;Self Care/advanced ADL retraining;Wheelchair Advertising account planner;Neuromuscular re-education OT Self Feeding Anticipated Outcome(s): Supervision OT Basic Self-Care Anticipated Outcome(s): Min Assist OT Toileting Anticipated Outcome(s): Mod Assist OT Bathroom Transfers Anticipated Outcome(s): Min Assist OT Recommendation Patient destination: Home Follow Up Recommendations: 24 hour supervision/assistance Equipment Recommended: To be determined   Skilled Therapeutic Intervention OT 1:1 initial evaluation completed with treatment provided to address awareness, attention, memory, transfers, mobility, toileting ,and adapted bathing and dressing skills.    Pt received awake and alert but disoriented to place and date.  Pt able to follow all commands for bed mobility, transfers, and assisted BADL at overall mod assist level d/t residual RUE impairment and decreased standing balance.   Husband arrived after lunch and was interviewed for update to pt's level of participation with BADL prior to admission.   OT Evaluation Precautions/Restrictions  Precautions Precautions: Cervical;Fall Restrictions Weight Bearing Restrictions: No  General Chart Reviewed: Yes Family/Caregiver Present: No  Vital  Signs Therapy Vitals Pulse Rate: 64 BP: 119/85 mmHg Patient Position (if appropriate): Sitting Oxygen Therapy SpO2: 100 % O2 Device: Not Delivered Pulse Oximetry Type: Intermittent  Pain Pain Assessment Pain Assessment: No/denies pain Pain Score: 0-No pain  Home Living/Prior Functioning Home Living Living Arrangements: Spouse/significant other Available Help at Discharge: Family Type of Home: House Home Access: Level entry Home Layout: One level Bathroom Shower/Tub: Gaffer, Architectural technologist: Handicapped height Bathroom Accessibility: Yes  Lives With: Spouse IADL History Homemaking Responsibilities: No Meal Prep Responsibility: No Laundry Responsibility: No Cleaning Responsibility: No Bill Paying/Finance Responsibility: No Shopping Responsibility: No Child Care Responsibility: No Current License: No Education: college educated, taught English and [place] 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  ADL ADL ADL Comments: see Functional Assessment Tool  Vision/Perception  Vision- History Baseline Vision/History: Wears glasses Wears Glasses: At all times Patient Visual Report: No change from baseline Vision- Assessment Vision Assessment?: No apparent visual deficits   Cognition Overall Cognitive Status: No family/caregiver present to determine baseline cognitive functioning Arousal/Alertness: Awake/alert Orientation Level: Person;Situation Year: 2017 Month: September Day of Week: Incorrect Memory: Impaired Memory Impairment: Decreased recall of new  information Immediate Memory Recall: Sock;Blue;Bed Memory Recall: Sock;Blue;Bed Memory Recall Sock: Without Cue Memory Recall Blue: With Cue Memory Recall Bed: With Cue Attention: Selective Sustained Attention: Appears intact Selective Attention: Appears intact Awareness: Appears intact Awareness Impairment: Emergent impairment Problem Solving: Impaired Problem Solving Impairment: Functional complex Executive Function: Initiating;Self Monitoring;Self Correcting Initiating: Impaired Initiating Impairment: Functional complex Self Monitoring: Impaired Self Monitoring Impairment: Functional complex Self Correcting: Impaired Self Correcting Impairment: Functional complex Safety/Judgment:  Appears intact  Sensation Sensation Light Touch: Appears Intact Stereognosis: Appears Intact Hot/Cold: Appears Intact Proprioception: Appears Intact Coordination Gross Motor Movements are Fluid and Coordinated: No (residual incorrdination of RUE from prior CVA) Fine Motor Movements are Fluid and Coordinated: No (residual [place] impairment from prior CVA)  Motor  Motor Motor: Hemiplegia Motor - Skilled Clinical Observations: RUE hemiparesis complicated by old RTC injury limiting shoulder AROM  Mobility   Bed Mobility Bed Mobility: Rolling Right;Rolling Left;Supine to Sit;Sit to Supine Rolling Right: 5: Supervision Rolling Left: 4: Min assist Rolling Left Details: Verbal cues for technique;Tactile cues for placement Supine to Sit: 5: Supervision Sit to Supine: 5: Supervision Transfers Sit to Stand: With armrests;2: Max assist Sit to Stand Details: Tactile cues for sequencing;Verbal cues for sequencing;Verbal cues for technique;Tactile cues for placement Stand to Sit: 2: Max assist;With armrests Stand to Sit Details (indicate cue type and reason): Tactile cues for sequencing;Tactile cues for placement   Trunk/Postural Assessment  Cervical Assessment Cervical Assessment: Within Functional  Limits Thoracic Assessment Thoracic Assessment: Within Functional Limits Lumbar Assessment Lumbar Assessment: Exceptions to [facility] Postural Control Postural Control: Within Functional Limits   Balance Balance Balance Assessed: Yes Static Sitting Balance Static Sitting - Balance Support: No upper extremity supported;Feet supported Static Sitting - Level of Assistance: 6: Modified independent (Device/Increase time) Dynamic Sitting Balance Dynamic Sitting - Balance Support: Feet supported;No upper extremity supported Dynamic Sitting - Level of Assistance: 5: Stand by assistance Sitting balance - Comments: Assist required initially for seated balance however pt was able to gain balance and sit without support for ~2 minutes (L lateral lean noted).  Static Standing Balance Static Standing - Balance Support: Bilateral upper extremity supported;During functional activity Static Standing - Level of Assistance: 2: Max assist  Extremity/Trunk Assessment RUE Assessment RUE Assessment: Exceptions to [facility] RUE AROM (degrees) Overall AROM Right Upper Extremity: Due to premorbid status (Impaired shoulder abduction, flexion d/t old RTC injury) LUE Assessment LUE Assessment: Within Functional Limits   See Function Navigator for Current Functional Status.   Refer to Care Plan for Long Term Goals  Recommendations for other services: None  Discharge Criteria: Patient will be discharged from OT if patient refuses treatment 3 consecutive times without medical reason, if treatment goals not met, if there is a change in medical status, if patient makes no progress towards goals or if patient is discharged from hospital.  The above assessment, treatment plan, treatment alternatives and goals were discussed and mutually agreed upon: by patient  [facility] 09/14/2015, 1:42 PM

## 2015-09-14 NOTE — Progress Notes (Signed)
Patient information reviewed and entered into eRehab system by Aaron Boeh, RN, CRRN, PPS Coordinator.  Information including medical coding and functional independence measure will be reviewed and updated through discharge.     Per nursing patient was given "Data Collection Information Summary for Patients in Inpatient Rehabilitation Facilities with attached "Privacy Act Statement-Health Care Records" upon admission.  

## 2015-09-14 NOTE — IPOC Note (Signed)
Overall Plan of Care [facility]) Patient Details Name: Darlene Murray MRN: [id number] DOB: [birth year]  Admitting Diagnosis: B CVA  Hospital Problems: Active Problems:   Thalamic infarct, acute (Falls)   Acute left hemiparesis (HCC)   Asthma, severe persistent     Functional Problem List: Nursing Bowel, Bladder, Skin Integrity, Safety  PT Balance, Behavior, Pain, Safety, Endurance  OT Balance, Endurance, Motor, Safety  SLP    TR         Basic ADL's: OT Grooming, Bathing, Dressing, Toileting     Advanced  ADL's: OT       Transfers: PT Bed Mobility, Bed to Chair, Car, Manufacturing systems engineer, Metallurgist: PT Emergency planning/management officer, Ambulation, Stairs     Additional Impairments: OT None  SLP        TR      Anticipated Outcomes Item Anticipated Outcome  Self Feeding Supervision  Museum/gallery exhibitions officer  Mod Assist   Bathroom Transfers Min Assist  Bowel/Bladder  Continent of bowel and bladder LBM 3/6  Transfers  Min A for transfers  Locomotion  s for w/c mobility, Min A for ambulation  Communication     Cognition     Pain  <4  Safety/Judgment  supervision   Therapy Plan: PT Intensity: Minimum of 1-2 x/day ,45 to 90 minutes PT Frequency: 5 out of 7 days PT Duration Estimated Length of Stay: 10-14 days OT Intensity: Minimum of 1-2 x/day, 45 to 90 minutes OT Frequency: 5 out of 7 days OT Duration/Estimated Length of Stay: 10-14 days         Team Interventions: Nursing Interventions Patient/Family Education, Bowel Management, Disease Management/Prevention, Medication Management  PT interventions Balance/vestibular training, Ambulation/gait training, Discharge planning, DME/adaptive equipment instruction, Functional mobility training, Pain management, Psychosocial support, Splinting/orthotics, Therapeutic Activities, UE/LE Strength taining/ROM, Wheelchair propulsion/positioning, UE/LE Coordination activities,  Therapeutic Exercise, Stair training, Neuromuscular re-education, Patient/family education  OT Interventions Balance/vestibular training, Discharge planning, Functional mobility training, Patient/family education, Therapeutic Exercise, Therapeutic Activities, Self Care/advanced ADL retraining, Wheelchair propulsion/positioning, DME/adaptive equipment instruction, Neuromuscular re-education  SLP Interventions    TR Interventions    SW/CM Interventions Discharge Planning, Psychosocial Support, Patient/Family Education    Team Discharge Planning: Destination: PT-Home ,OT- Home , SLP-  Projected Follow-up: PT-Home health PT, OT-  24 hour supervision/assistance, SLP-  Projected Equipment Needs: PT-None recommended by PT, OT- To be determined, SLP-  Equipment Details: PT- , OT-  Patient/family involved in discharge planning: PT- Patient, Patient unable/family or caregiver not available,  OT-Patient, SLP-   MD ELOS: 10-12 days Medical Rehab Prognosis:  Excellent Assessment: 82 y.o. right handed female with history asthma maintained on prednisone,venous insufficiency and previous CVA with right-sided residual weakness and received inpatient rehabilitation services October 2016. She was able to ambulate short distances with a walker but primarily stayed in a wheelchair and was receiving home health therapies. Husband has been assisting minimally for showering toileting and dressing. Presented 09/11/2015 with left-sided weakness and slurred speech. MRI of the brain shows acute nonhemorrhagic infarct right thalamus and posterior limb right internal capsule. Tiny acute medial left occipital lobe infarct. Remote bilateral cerebellar infarcts. MRA MRI of the head and neck showed moderate to marked narrowing proximal right vertebral artery as well as right subclavian artery and left subclavian artery. Neurology consulted and currently maintained on aspirin and Plavix for CVA prophylaxis.   See Team Conference  Notes for weekly updates to the plan  of care

## 2015-09-15 ENCOUNTER — Inpatient Hospital Stay (HOSPITAL_COMMUNITY): Payer: Medicare Other | Admitting: Occupational Therapy

## 2015-09-15 ENCOUNTER — Inpatient Hospital Stay (HOSPITAL_COMMUNITY): Payer: Medicare Other | Admitting: *Deleted

## 2015-09-15 NOTE — Progress Notes (Signed)
[place] PHYSICAL MEDICINE & REHABILITATION     PROGRESS NOTE  Subjective/Complaints:  Patient seen this morning sitting up in bed. She is happy to be back in rehabilitation.  ROS: Denies CP, SOB, N/V/D  Objective: Vital Signs: Blood pressure 117/59, pulse 67, temperature 97.8 F (36.6 C), temperature source Oral, resp. rate 18, height 5\' 4"  (1.626 m), weight 73.029 kg (161 lb), SpO2 99 %. No results found.  Recent Labs  09/13/15 1709 09/14/15 0440  WBC 6.9 7.1  HGB 13.5 13.1  HCT 41.7 40.6  PLT 229 233    Recent Labs  09/13/15 1709 09/14/15 0440  NA  --  144  K  --  3.8  CL  --  109  GLUCOSE  --  76  BUN  --  13  CREATININE 1.19* 1.05*  CALCIUM  --  8.5*   CBG (last 3)  No results for input(s): GLUCAP in the last 72 hours.  Wt Readings from Last 3 Encounters:  09/13/15 73.029 kg (161 lb)  08/02/15 72.576 kg (160 lb)  06/27/15 73.71 kg (162 lb 8 oz)    Physical Exam:  BP 117/59 mmHg  Pulse 67  Temp(Src) 97.8 F (36.6 C) (Oral)  Resp 18  Ht 5\' 4"  (1.626 m)  Wt 73.029 kg (161 lb)  BMI 27.62 kg/m2  SpO2 99% Constitutional: She appears well-developed and well-nourished. NAD. Vital signs reviewed.  HENT: Normocephalic and atraumatic.  Eyes: Conjunctivae and EOM are normal. No scleral icterus.   Cardiovascular: Normal rate and regular rhythm.  Respiratory: Effort normal and breath sounds normal. No respiratory distress.  GI: Soft. Bowel sounds are normal. She exhibits no distension.  Musculoskeletal: She exhibits no edema or tenderness.  Neurological:  She is alert and oriented x3 with increased time and cues Follows simple commands. Motor: LUE/LLE: 5/5 RUE/RLE: 4+/5 proximal to distal  Skin: Skin is warm and dry.  Psychiatric: She has a normal mood and affect. Her behavior is normal. Thought content normal  Assessment/Plan: 1. Functional deficits secondary to right posterior limb and left occipital right thalamic CVA with history of CVA 2016  and right side residual weakness which require 3+ hours per day of interdisciplinary therapy in a comprehensive inpatient rehab setting. Physiatrist is providing close team supervision and 24 hour management of active medical problems listed below. Physiatrist and rehab team continue to assess barriers to discharge/monitor patient progress toward functional and medical goals.  Function:  Bathing Bathing position   Position: Wheelchair/chair at sink  Bathing parts Body parts bathed by patient: Right arm, Left arm, Chest, Abdomen, Right upper leg, Left upper leg Body parts bathed by helper: Front perineal area, Buttocks, Right lower leg, Left lower leg, Back  Bathing assist Assist Level: Touching or steadying assistance(Pt > 75%)      Upper Body Dressing/Undressing Upper body dressing                    Upper body assist        Lower Body Dressing/Undressing Lower body dressing   What is the patient wearing?: Underwear, Pants, Ted Hose, Non-skid slipper socks Underwear - Performed by patient: Thread/unthread right underwear leg, Thread/unthread left underwear leg Underwear - Performed by helper: Pull underwear up/down Pants- Performed by patient: Thread/unthread right pants leg, Thread/unthread left pants leg Pants- Performed by helper: Pull pants up/down Non-skid slipper socks- Performed by patient: Don/doff right sock, Don/doff left sock  TED Hose - Performed by helper: Don/doff right TED hose, Don/doff left TED hose  Lower body assist Assist for lower body dressing: Touching or steadying assistance (Pt > 75%)      Toileting Toileting   Toileting steps completed by patient: Performs perineal hygiene Toileting steps completed by helper: Adjust clothing prior to toileting, Adjust clothing after toileting Toileting Assistive Devices: Grab bar or rail  Toileting assist Assist level: Touching or steadying assistance (Pt.75%)   Transfers Chair/bed  transfer   Chair/bed transfer method: Squat pivot Chair/bed transfer assist level: Maximal assist (Pt 25 - 49%/lift and lower) Chair/bed transfer assistive device: Bedrails, Armrests     Locomotion Ambulation     Max distance: 5 Assist level: Maximal assist (Pt 25 - 49%)   Wheelchair   Type: Manual Max wheelchair distance: 85 ft Assist Level: Supervision or verbal cues  Cognition Comprehension Comprehension assist level: Understands complex 90% of the time/cues 10% of the time  Expression Expression assist level: Expresses complex 90% of the time/cues < 10% of the time  Social Interaction Social Interaction assist level: Interacts appropriately 90% of the time - Needs monitoring or encouragement for participation or interaction.  Problem Solving Problem solving assist level: Solves basic 75 - 89% of the time/requires cueing 10 - 24% of the time  Memory Memory assist level: Recognizes or recalls 50 - 74% of the time/requires cueing 25 - 49% of the time      Medical Problem List and Plan: 1. 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 DVT Prophylaxis/Anticoagulation: Subcutaneous Lovenox. Monitor platelet counts and any signs of bleeding 3. Pain Management: Tylenol as needed 4. Asthma. Continue chronic prednisone. Continue Dulera 5. Neuropsych: This patient is capable of making decisions on her own behalf. 6. Skin/Wound Care: Routine skin checks 7. Fluids/Electrolytes/Nutrition: Routine I&O   Eating 100% of her meals for the most part  BMP within acceptable range on 3/10  8. Hyperlipidemia. Pravachol 9. Legally blind right eye  LOS (Days) 2 A FACE TO FACE EVALUATION WAS PERFORMED  Ankit Lorie Phenix 09/15/2015 8:30 AM

## 2015-09-15 NOTE — Progress Notes (Signed)
Occupational Therapy Session Notes  Patient Details  Name: Darlene Murray MRN: [id number] Date of Birth: 1922/03/16  Today's Date: 09/15/2015  Short Term Goals: Week 1:  OT Short Term Goal 1 (Week 1): Pt will complete upper body dressing with min assist OT Short Term Goal 2 (Week 1): Pt will complete lower body dressing with mod assist OT Short Term Goal 3 (Week 1): Pt will bathe seated on bench with min assist OT Short Term Goal 4 (Week 1): Pt will complete clothing mangagement for toileting with supervision OT Short Term Goal 5 (Week 1): Pt will groom seated at sink with setup and supervision   Skilled Therapeutic Interventions/Progress Updates:   Session #1 [phone] - 60 Minutes Individual Therapy Pain - no complaints of pain Patient found supine in bed. Pt willing and eager to get OOB. Pt engaged in bed mobility with HOB raised and using bed rails moderately. Pt sat EOB, then transferred EOB to w/c. Pt required moderate multimodal cueing for sequencing, hand placement, and safety during transfer. Pt with left lateral lean and had difficulty positioning self in w/c. Pt stated she needed to get her clothes, therapist encouraged pt to propel self around bed to gather clothes from recliner. Cueing for safety to get close and lock brakes on w/c prior to reaching for clothes. Therapist then assisted pt to sink for ADL of B&D and grooming tasks in sit to/from stand positions as appropriate and necessary. Pt required minimal assist for sit to/from stands this session. During ADL focused on problem solving, sequencing, initiation, activity tolerance/endurance, coming to midline while seated & standing, and functional use of BUEs. Pt took increased time to complete tasks. At end of session, left pt seated in w/c with all needs within reach and quick release belt donned.    ------------------------------------------------------------------------------------------------------------------------------  Session #2 1400-1500 - 60 minutes Individual Therapy Pain - no complaints of pain Patient received seated in w/c with husband present. Pt with request to use BR. Therapist propelled pt into BR. Pt performed stand pivot transfer using grab bars w/c to [employer] [facility] [employer] [facility] placed over toilet seat. Pt required max assist for toileting, requiring assistance for clothing management prior to and post toileting. Pt transferred back to w/c and therapist assisted pt to sink for her to wash her hands. From here, NT took patient's vitals. Therapist then assisted pt to dayroom, pt stated "I don't like going to the gym". Therapist engaged pt in therapeutic activity focusing on sit to/from stands, dynamic standing balance/tolerance/endurance, functional use of bilateral UEs, initiation, problem solving, and maintaining midline in sitting and standing. Pt kept complaining of being tired and wanting to sit down in standing, therapist and patient's husband provided encouragement for pt to stand. Pt did require multimodal cueing to stand up straight and not lean on high/low table. From here, therapist assisted pt back to room and engaged pt in LUE exercises: shoulder flexion/extension, shoulder abduction/adduction, elbow flexion/extension, wrist flexion/extension, and finger flexion/extension. At end of session, left pt seated in w/c with husband present. Husband aware to notify nursing staff if he leaves so staff can don quick release belt or assist pt back to bed. Notified RN of patient's where-a-bouts and that husband present in room with her.  Therapy Documentation Precautions:  Precautions Precautions: Cervical, Fall Restrictions Weight Bearing Restrictions: No  Vital Signs: Therapy Vitals Temp: 97.8 F (36.6 C) Temp Source: Oral Pulse Rate: 67 Resp: 18 BP: (!) 117/59 mmHg Patient Position (if  appropriate): Lying Oxygen Therapy SpO2: 99 % O2  Device: Not Delivered  ADL: ADL ADL Comments: see Functional Assessment Tool  See Function Navigator for Current Functional Status.  Chrys Racer , MS, OTR/L, CLT  09/15/2015, 7:25 AM

## 2015-09-15 NOTE — Progress Notes (Signed)
Physical Therapy Session Note  Patient Details  Name: Darlene Murray MRN: [id number] Date of Birth: 09/23/1921  Today's Date: 09/15/2015 PT Individual Time: 1050-1205 PT Individual Time Calculation (min): 75 min   Short Term Goals: Week 1:  PT Short Term Goal 1 (Week 1): Pt will be able to ambulate 15 ft with LRAD & Mod A. PT Short Term Goal 2 (Week 1): Pt will complete squat pivot transfers with Min A & LRAD. PT Short Term Goal 3 (Week 1): Pt will perform sit<>stand transfers with Min A.  Skilled Therapeutic Interventions/Progress Updates:  Tx focused on functional mobility training, gait with RW, and NMR for midline orientation via forced use, manual facilitation, and multi-modal cues throughout tx. Spouse present and supportive. Pt needed mod cues for participation, but cheerful. Instructed pt in incentive spirometry with max cues for technique x10. Her son is currently in ICU for respiratory infection of some kind.   Pt performed WC propulsion x100' with S and cues for stroke efficiency, increased time required.   Squat pivot transfer x3 with up to Mod A and cues for technique and sequence, manual facilitation for anterior lean. Sit<>stands x3 during tx and with 5xSTS with up to Mod for lifting or lowering with max cues for head leading to lean R over BOS and feet aligned beneath. 5xSTS = 64min 17 sec from 22" mat with RW in front for comfort.   NMR  Reciprocal scooting anterior/posterior as well as laterally along entire mat to increase anterior translation and hip clearance. L lean/mild pushing still noted in sitting.  Wedge placed under L hip for midline orientation during reaching/[placgn task outside BOS on R side for clip placement, including up/down to R elbow.   Static standing with mirror for visual feedback with/wihtout UE support 3x82min with manual facilitation for upright trunk. PT challenged with R-leaning UE clip placement task for midline orientation.   Gait training 1x25'  with mod A overall and mirror for visual feedback, cues and manual facilitation for midline and step placement.   Pt left up in [facility] with spouse present. Will likely benefit from standing [place] [facility] especially done standing on RLE.       Therapy Documentation Precautions:  Precautions Precautions: Cervical, Fall Restrictions Weight Bearing Restrictions: No General:   Vital Signs: Therapy Vitals Pulse Rate: 79 Resp: 16 Patient Position (if appropriate): Sitting Oxygen Therapy SpO2: 98 % O2 Device: Not Delivered Pain: none    Other Treatments:     See Function Navigator for Current Functional Status.   Therapy/Group: Individual Therapy  Kaiyah Eber Soundra Pilon, PT, DPT  09/15/2015, 11:31 AM

## 2015-09-16 ENCOUNTER — Inpatient Hospital Stay (HOSPITAL_COMMUNITY): Payer: Medicare Other | Admitting: Physical Therapy

## 2015-09-16 NOTE — Progress Notes (Signed)
Physical Therapy Session Note  Patient Details  Name: Darlene Murray MRN: [id number] Date of Birth: July 22, 1921  Today's Date: 09/16/2015 PT Individual Time: [id number] PT Individual Time Calculation (min): 45 min   Short Term Goals: Week 1:  PT Short Term Goal 1 (Week 1): Pt will be able to ambulate 15 ft with LRAD & Mod A. PT Short Term Goal 2 (Week 1): Pt will complete squat pivot transfers with Min A & LRAD. PT Short Term Goal 3 (Week 1): Pt will perform sit<>stand transfers with Min A.  Skilled Therapeutic Interventions/Progress Updates:    Pt received in bed, husband present, & agreeable to PT. Pt required encouragement/motivation from PT & husband to participate in skilled PT session. Pt transferred supine>sit with HOB raised, bed rails & extended time. Pt required cuing and time to scoot to EOB. Pt then transferred bed>w/c via squat pivot; pt with improving anterior weight shift with task, but required multiple scoots to complete transfer. Pt self propelled w/c with BUE & supervision for 140 ft from room>gym with significantly extra time. Pt required multiple rest breaks 2/2 fatigue. Once in gym pt transferred sit>stand with mod A & maximum tactile/verbal/visual cuing for proper hand placement on w/c armrests. Pt required Mod A to shift weight forward to complete transfer, then once in upright position PT provided tactile cuing to shift pelvis forward & chest back to facilitate more upright posture but pt resisting movement. Pt ambulated 12 ft with maximum encouragement as pt frequently reported need to sit 2/2 fear of falling. PT manually facilitated wider step length with LLE to limit narrow BOS. Pt required max A to sit unexpectedly on mat; PT educated pt on need to listen to instructions and continue practicing ambulating to improve abilities to help reduce fall risk. When ambulating pt demonstrates L lateral trunk flexion. Pt performed sitting & reaching for objects with PT facilitating  lateral weight shifts to help elongate pt's L trunk; pt with very limited mobility with task. Pt then performed squat>pivot transfer mat>w/c in same manner as noted above. At end of session pt left sitting in w/c with QRB in place, all needs within reach & husband present to supervise.   Therapy Documentation Precautions:  Precautions Precautions: Cervical, Fall Restrictions Weight Bearing Restrictions: No  Pain: Pain Assessment Pain Assessment: No/denies pain   See Function Navigator for Current Functional Status.   Therapy/Group: Individual Therapy  Waunita Schooner 09/16/2015, 4:46 PM

## 2015-09-16 NOTE — Progress Notes (Signed)
[name] PHYSICAL MEDICINE & REHABILITATION     PROGRESS NOTE  Subjective/Complaints:  Pt laying in bed this AM with her glasses on.  She requests her TEDS be taken off because they are too tight.  Informed pt the purpose of TEDs.  ROS: Denies CP, SOB, N/V/D  Objective: Vital Signs: Blood pressure 122/51, pulse 66, temperature 97.9 F (36.6 C), temperature source Oral, resp. rate 18, height 5\' 4"  (1.626 m), weight 73.029 kg (161 lb), SpO2 99 %. No results found.  Recent Labs  09/13/15 1709 09/14/15 0440  WBC 6.9 7.1  HGB 13.5 13.1  HCT 41.7 40.6  PLT 229 233    Recent Labs  09/13/15 1709 09/14/15 0440  NA  --  144  K  --  3.8  CL  --  109  GLUCOSE  --  76  BUN  --  13  CREATININE 1.19* 1.05*  CALCIUM  --  8.5*   CBG (last 3)  No results for input(s): GLUCAP in the last 72 hours.  Wt Readings from Last 3 Encounters:  09/13/15 73.029 kg (161 lb)  08/02/15 72.576 kg (160 lb)  06/27/15 73.71 kg (162 lb 8 oz)    Physical Exam:  BP 122/51 mmHg  Pulse 66  Temp(Src) 97.9 F (36.6 C) (Oral)  Resp 18  Ht 5\' 4"  (1.626 m)  Wt 73.029 kg (161 lb)  BMI 27.62 kg/m2  SpO2 99% Constitutional: She appears well-developed and well-nourished. NAD. Vital signs reviewed.  HENT: Normocephalic and atraumatic.  Eyes: Conjunctivae and EOM are normal. No scleral icterus.   Cardiovascular: Normal rate and regular rhythm.  Respiratory: Effort normal and breath sounds normal. No respiratory distress.  GI: Soft. Bowel sounds are normal. She exhibits no distension.  Musculoskeletal: She exhibits no edema or tenderness.  Neurological:  She is alert and oriented x3 with increased time and cues Follows simple commands. Motor: LUE/LLE: 5/5 RUE/RLE: 4+/5 proximal to distal  Skin: Skin is warm and dry.  Psychiatric: She has a normal mood and affect. Her behavior is normal. Thought content normal  Assessment/Plan: 1. Functional deficits secondary to right posterior limb and left  occipital right thalamic CVA with history of CVA 2016 and right side residual weakness which require 3+ hours per day of interdisciplinary therapy in a comprehensive inpatient rehab setting. Physiatrist is providing close team supervision and 24 hour management of active medical problems listed below. Physiatrist and rehab team continue to assess barriers to discharge/monitor patient progress toward functional and medical goals.  Function:  Bathing Bathing position   Position: Wheelchair/chair at sink  Bathing parts Body parts bathed by patient: Right arm, Left arm Body parts bathed by helper: Chest, Abdomen, Front perineal area, Buttocks, Right upper leg, Left upper leg, Right lower leg, Left lower leg, Back  Bathing assist Assist Level:  (helper did all but face arms and hands)      Upper Body Dressing/Undressing Upper body dressing   What is the patient wearing?: Pull over shirt/dress Bra - Perfomed by patient: Thread/unthread right bra strap, Thread/unthread left bra strap Bra - Perfomed by helper: Hook/unhook bra (pull down sports bra) Pull over shirt/dress - Perfomed by patient: Thread/unthread right sleeve, Thread/unthread left sleeve, Put head through opening Pull over shirt/dress - Perfomed by helper: Thread/unthread right sleeve, Thread/unthread left sleeve, Put head through opening, Pull shirt over trunk Button up shirt - Perfomed by patient: Thread/unthread right sleeve, Thread/unthread left sleeve Button up shirt - Perfomed by helper: Pull shirt around back (  button n/a)    Upper body assist Assist Level: Touching or steadying assistance(Pt > 75%), More than reasonable time      Lower Body Dressing/Undressing Lower body dressing   What is the patient wearing?: [employer], Pants, Underwear Underwear - Performed by patient: Thread/unthread right underwear leg, Thread/unthread left underwear leg Underwear - Performed by helper: Pull underwear up/down Pants- Performed by  patient: Thread/unthread right pants leg, Thread/unthread left pants leg Pants- Performed by helper: Thread/unthread left pants leg, Thread/unthread right pants leg, Pull pants up/down Non-skid slipper socks- Performed by patient: Don/doff right sock, Don/doff left sock Non-skid slipper socks- Performed by helper: Don/doff left sock, Don/doff right sock       Shoes - Performed by helper: Don/doff right shoe, Don/doff left shoe, Fasten right, Fasten left       TED Hose - Performed by helper: Don/doff left TED hose, Don/doff right TED hose  Lower body assist Assist for lower body dressing: Touching or steadying assistance (Pt > 75%) (min assist sit to/from stand)      Naval architect activity did not occur: No continent bowel/bladder event Toileting steps completed by patient: Performs perineal hygiene, Adjust clothing prior to toileting Toileting steps completed by helper: Adjust clothing after toileting Toileting Assistive Devices: Grab bar or rail  Toileting assist Assist level: Touching or steadying assistance (Pt.75%)   Transfers Chair/bed transfer   Chair/bed transfer method: Squat pivot Chair/bed transfer assist level: Moderate assist (Pt 50 - 74%/lift or lower) Chair/bed transfer assistive device: Armrests     Locomotion Ambulation     Max distance: 25 Assist level: Moderate assist (Pt 50 - 74%)   Wheelchair   Type: Manual Max wheelchair distance: 100 Assist Level: Supervision or verbal cues  Cognition Comprehension Comprehension assist level: Understands basic 90% of the time/cues < 10% of the time  Expression Expression assist level: Expresses complex 90% of the time/cues < 10% of the time  Social Interaction Social Interaction assist level: Interacts appropriately 90% of the time - Needs monitoring or encouragement for participation or interaction.  Problem Solving Problem solving assist level: Solves basic 75 - 89% of the time/requires cueing 10 -  24% of the time  Memory Memory assist level: Recognizes or recalls 50 - 74% of the time/requires cueing 25 - 49% of the time      Medical Problem List and Plan: 1. 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 DVT Prophylaxis/Anticoagulation: Subcutaneous Lovenox. Monitor platelet counts and any signs of bleeding 3. Pain Management: Tylenol as needed 4. Asthma. Continue chronic prednisone. Continue Dulera 5. Neuropsych: This patient is capable of making decisions on her own behalf. 6. Skin/Wound Care: Routine skin checks 7. Fluids/Electrolytes/Nutrition: Routine I&O   Eating 25-50% of her meals  BMP within acceptable range on 3/10  8. Hyperlipidemia. Pravachol 9. Legally blind right eye  LOS (Days) 3 A FACE TO FACE EVALUATION WAS PERFORMED  Darlene Murray Darlene Murray 09/16/2015 1:52 PM

## 2015-09-17 ENCOUNTER — Inpatient Hospital Stay (HOSPITAL_COMMUNITY): Payer: Medicare Other

## 2015-09-17 ENCOUNTER — Ambulatory Visit: Payer: Medicare Other | Admitting: Physical Medicine & Rehabilitation

## 2015-09-17 ENCOUNTER — Inpatient Hospital Stay (HOSPITAL_COMMUNITY): Payer: Medicare Other | Admitting: Physical Therapy

## 2015-09-17 ENCOUNTER — Inpatient Hospital Stay (HOSPITAL_COMMUNITY): Payer: Medicare Other | Admitting: Occupational Therapy

## 2015-09-17 NOTE — Progress Notes (Addendum)
Physical Therapy Session Note  Patient Details  Name: Darlene Murray MRN: [id number] Date of Birth: 1922-07-01  Today's Date: 09/17/2015 PT Individual Time: 1000-1100 and [id number] PT Individual Time Calculation (min): 60 min and 60 min   Short Term Goals: Week 1:  PT Short Term Goal 1 (Week 1): Pt will be able to ambulate 15 ft with LRAD & Mod A. PT Short Term Goal 2 (Week 1): Pt will complete squat pivot transfers with Min A & LRAD. PT Short Term Goal 3 (Week 1): Pt will perform sit<>stand transfers with Min A.  Skilled Therapeutic Interventions/Progress Updates:    Treatment 1: Pt received in w/c & agreeable to PT. Pt self-propelled w/c with BUE & supervision assist for 140 ft. PT provided cuing to extend arms back further to allow pt to produce greater push; pt required significantly increased time to propel w/c. PT provided maximum cuing to assist pt with w/c set up for squat pivot transfer. Pt required verbal & visual cuing and Min A for management of w/c locks, leg rests, and armrest. Pt completed squat pivot transfer w/c>mat table with Mod A and during middle of transfer pt stood fully upright even with cuing for sequencing & required assistance to sit safely on mat. PT thoroughly educated pt on need to follow instructions throughout session to maintain safety and to improve her independence with functional task. Visual feedback from mirror was utilized to help pt self correct L lateral lean in sitting and standing. Pt also able to increase upright standing posture with visual feedback. Pt transferred sit>stand with min A, then ambulated 12 ft + 15 ft with RW & Min A. PT provided maximum verbal cuing to increase step width with LLE & sequence for advancing LLE, and cuing to ambulate in base of RW. Pt required seated rest breaks between ambulation trials and encouragement from PT to increase ambulation distances. Pt then self propelled w/c for a total of 50 ft from gym>room with rest breaks. At  end of session pt left in w/c with all needs within reach & QRB in place.   Treatment 2: Pt received sitting in w/c & agreeable to PT with motivation from PT & husband. Pt transported to gym via w/c total A for time management. Pt educated on management of w/c leg rests with maximum cuing (visual & verbal) & min A. Pt transferred sit<>stand with CGA/Min A, PT provided cuing for upright posture to limit L lateral lean. Pt ambulated 15 ft + 25 ft with RW & Min A. PT provided cuing to increase LLE step width; pt required extensive cuing to complete turns and transfer stand>sit. Pt performed standing with 1 UE support on RW & supervision assistance for endurance training; pt able to tolerate standing 5 minutes 40 seconds + 6  Minutes 10 seconds with sitting rest break in between. Pt completed 5x sit-to-stand task in 71 seconds which is 6 second improvement from last test. Pt utilized BUE for activity, with task focusing on correct BUE hand placement and LE strengthening. At end of session pt left in w/c with QRB in place, husband present in room, and all needs within reach. Throughout entire session PT educated pt on purpose and benefits of physical therapy.   Therapy Documentation Precautions:  Precautions Precautions: Cervical, Fall Restrictions Weight Bearing Restrictions: No  Vital Signs: Therapy Vitals BP: (!) 125/57 mmHg Pain: Pain Assessment Pain Assessment: No/denies pain Pain Score: 0-No pain   See Function Navigator for Current Functional Status.  Therapy/Group: Individual Therapy  Waunita Schooner 09/17/2015, 12:36 PM

## 2015-09-17 NOTE — Progress Notes (Signed)
Occupational Therapy Session Note  Patient Details  Name: Darlene Murray MRN: [id number] Date of Birth: [birth year]  Today's Date: 09/17/2015 OT Individual Time: [id number] OT Individual Time Calculation (min): 60 min   Short Term Goals: Week 1:  OT Short Term Goal 1 (Week 1): Pt will complete upper body dressing with min assist OT Short Term Goal 2 (Week 1): Pt will complete lower body dressing with mod assist OT Short Term Goal 3 (Week 1): Pt will bathe seated on bench with min assist OT Short Term Goal 4 (Week 1): Pt will complete clothing mangagement for toileting with supervision OT Short Term Goal 5 (Week 1): Pt will groom seated at sink with setup and supervision  Skilled Therapeutic Interventions/Progress Updates: ADL-retraining with focus on improved sit<>stand, static and dynamic standing balance, improved awareness and attention, and [place] of left hand.   Pt received supine in bed, alert but disoriented to place or date.   Pt was not willing to bathe in shower but agreed to wash and dress at sink side. Pt required min assist for bed mobility and transfer to w/c and min assist to propel w/c to closet to select clothing with mod questioning cues.   After setup at sink pt required moderate questioning cues to progress through sequence of bathing with most cues provided initially until pt became more engaged in task.   Pt required min assist to fix her bra after she donned it, including fastening snap during this session.   Pt required repeated tactile cues to correct left lean although her perception was complicated by obstruction of soap dispenser placed on mirror, which forced her to lean to her left to see herself more clearly.   Pt required min assist to lace right pant leg and to don both shoes although she reached to each foot to fasten velcro closures independently.   Overall pt demo'd improved sit<>stand and static standing balance, improved bathing/dressing skills, and improved dynamic  standing balance (washing periarea and buttocks herself) but she lacked awareness needed to self-monitor and correct left lateral leans independently throughout entire session.     Therapy Documentation Precautions:  Precautions Precautions: Cervical, Fall Restrictions Weight Bearing Restrictions: No  Vital Signs: Therapy Vitals Temp: 98.1 F (36.7 C) Temp Source: Oral Pulse Rate: 71 Resp: 18 BP: 135/60 mmHg Patient Position (if appropriate): Lying Oxygen Therapy SpO2: 97 % O2 Device: Not Delivered   Pain: Pain Assessment Pain Assessment: No/denies pain  ADL: ADL ADL Comments: see Functional Assessment Tool  See Function Navigator for Current Functional Status.   Therapy/Group: Individual Therapy  Darlene Murray 09/17/2015, 8:31 AM

## 2015-09-17 NOTE — Progress Notes (Signed)
Occupational Therapy Session Note  Patient Details  Name: Quanisha Piasecki MRN: [id number] Date of Birth: Nov 23, 1921  Today's Date: 09/17/2015 OT Individual Time:  -   1100-1130  (30 min)      Short Term Goals: Week 1:  OT Short Term Goal 1 (Week 1): Pt will complete upper body dressing with min assist OT Short Term Goal 2 (Week 1): Pt will complete lower body dressing with mod assist OT Short Term Goal 3 (Week 1): Pt will bathe seated on bench with min assist OT Short Term Goal 4 (Week 1): Pt will complete clothing mangagement for toileting with supervision OT Short Term Goal 5 (Week 1): Pt will groom seated at sink with setup and supervision Week 2:     Skilled Therapeutic Interventions/Progress Updates:    Pt. Sitting in wc upon OT arrival.  Husband, Jenny Reichmann present.  Stand pivot transfer>BSC x1.  Pt continent of Bowel.  Stand pivot transfer to wc x1 with min assist with 1 steadying assist for LOB (manual facilitation for anterior lean) .  Provided cues for body mechanics and positioning when turning and going from sit to stand.  Husband reported she normally uses the [facility] but later clarified that she only uses when it is an emergency. Left pt in wc with safety belt on and call bell,phone within reach.       Therapy Documentation Precautions:  Precautions Precautions: Cervical, Fall Restrictions Weight Bearing Restrictions: No      Pain:  none   ADL: ADL ADL Comments: see Functional Assessment Tool        See Function Navigator for Current Functional Status.   Therapy/Group: Individual Therapy  Lisa Roca 09/17/2015, 7:53 AM

## 2015-09-17 NOTE — Progress Notes (Signed)
[place] PHYSICAL MEDICINE & REHABILITATION     PROGRESS NOTE  Subjective/Complaints:  Patient sitting up in her chair this morning with OT. She is in good spirits does not currently endorse any complaints.  ROS: Denies CP, SOB, N/V/D  Objective: Vital Signs: Blood pressure 135/60, pulse 71, temperature 98.1 F (36.7 C), temperature source Oral, resp. rate 18, height 5\' 4"  (1.626 m), weight 73.029 kg (161 lb), SpO2 97 %. No results found. No results for input(s): WBC, HGB, HCT, PLT in the last 72 hours. No results for input(s): NA, K, CL, GLUCOSE, BUN, CREATININE, CALCIUM in the last 72 hours.  Invalid input(s): CO CBG (last 3)  No results for input(s): GLUCAP in the last 72 hours.  Wt Readings from Last 3 Encounters:  09/13/15 73.029 kg (161 lb)  08/02/15 72.576 kg (160 lb)  06/27/15 73.71 kg (162 lb 8 oz)    Physical Exam:  BP 135/60 mmHg  Pulse 71  Temp(Src) 98.1 F (36.7 C) (Oral)  Resp 18  Ht 5\' 4"  (1.626 m)  Wt 73.029 kg (161 lb)  BMI 27.62 kg/m2  SpO2 97% Constitutional: She appears well-developed and well-nourished. NAD. Vital signs reviewed.  HENT: Normocephalic and atraumatic.  Eyes: Conjunctivae and EOM are normal. No scleral icterus.   Cardiovascular: Normal rate and regular rhythm.  Respiratory: Effort normal and breath sounds normal. No respiratory distress.  GI: Soft. Bowel sounds are normal. She exhibits no distension.  Musculoskeletal: She exhibits no edema or tenderness.  Neurological:  She is alert and oriented x3 with increased time and cues Follows simple commands. Motor: LUE/LLE: 5/5 RUE: 4/5 shoulder abduction, elbow flexion/extensionm, 4+/5 hand grip RLE: 4+/5 hip flexion, 5/5 knee extension, , ankle dorsi/plantarflexion  Skin: Skin is warm and dry.  Psychiatric: She has a normal mood and affect. Her behavior is normal. Thought content normal  Assessment/Plan: 1. Functional deficits secondary to right posterior limb and left  occipital right thalamic CVA with history of CVA 2016 and right side residual weakness which require 3+ hours per day of interdisciplinary therapy in a comprehensive inpatient rehab setting. Physiatrist is providing close team supervision and 24 hour management of active medical problems listed below. Physiatrist and rehab team continue to assess barriers to discharge/monitor patient progress toward functional and medical goals.  Function:  Bathing Bathing position   Position: Standing at sink  Bathing parts Body parts bathed by patient: Right arm, Left arm, Chest, Abdomen, Front perineal area, Buttocks Body parts bathed by helper: Back  Bathing assist Assist Level:  (helper did all but face arms and hands)      Upper Body Dressing/Undressing Upper body dressing   What is the patient wearing?: Bra, Pull over shirt/dress Bra - Perfomed by patient: Thread/unthread right bra strap, Thread/unthread left bra strap, Hook/unhook bra (pull down sports bra) Bra - Perfomed by helper: Hook/unhook bra (pull down sports bra) Pull over shirt/dress - Perfomed by patient: Thread/unthread left sleeve, Put head through opening, Pull shirt over trunk Pull over shirt/dress - Perfomed by helper: Thread/unthread right sleeve Button up shirt - Perfomed by patient: Thread/unthread right sleeve, Thread/unthread left sleeve Button up shirt - Perfomed by helper: Pull shirt around back (button n/a)    Upper body assist Assist Level: Touching or steadying assistance(Pt > 75%)      Lower Body Dressing/Undressing Lower body dressing   What is the patient wearing?: Underwear, Pants, Socks, Shoes, [employer]  - Performed by patient: Thread/unthread right underwear leg, Thread/unthread left underwear leg,  Pull underwear up/down Underwear - Performed by helper: Pull underwear up/down Pants- Performed by patient: Thread/unthread right pants leg, Thread/unthread left pants leg, Pull pants up/down Pants- Performed  by helper: Thread/unthread left pants leg, Thread/unthread right pants leg, Pull pants up/down Non-skid slipper socks- Performed by patient: Don/doff right sock, Don/doff left sock Non-skid slipper socks- Performed by helper: Don/doff left sock, Don/doff right sock   Socks - Performed by helper: Don/doff right sock, Don/doff left sock Shoes - Performed by patient: Fasten right, Fasten left Shoes - Performed by helper: Don/doff right shoe, Don/doff left shoe       TED Hose - Performed by helper: Don/doff right TED hose, Don/doff left TED hose  Lower body assist Assist for lower body dressing: Touching or steadying assistance (Pt > 75%) (min assist sit to/from stand)      Naval architect activity did not occur: No continent bowel/bladder event Toileting steps completed by patient: Performs perineal hygiene, Adjust clothing prior to toileting Toileting steps completed by helper: Adjust clothing after toileting Toileting Assistive Devices: Grab bar or rail  Toileting assist Assist level: Touching or steadying assistance (Pt.75%)   Transfers Chair/bed transfer   Chair/bed transfer method: Squat pivot Chair/bed transfer assist level: Moderate assist (Pt 50 - 74%/lift or lower) Chair/bed transfer assistive device: Armrests, Bedrails     Locomotion Ambulation     Max distance: 12 Assist level: Moderate assist (Pt 50 - 74%)   Wheelchair   Type: Manual Max wheelchair distance: 140 Assist Level: Supervision or verbal cues  Cognition Comprehension Comprehension assist level: Understands basic 90% of the time/cues < 10% of the time  Expression Expression assist level: Expresses basic 90% of the time/requires cueing < 10% of the time.  Social Interaction Social Interaction assist level: Interacts appropriately 90% of the time - Needs monitoring or encouragement for participation or interaction.  Problem Solving Problem solving assist level: Solves basic 90% of the  time/requires cueing < 10% of the time  Memory Memory assist level: Recognizes or recalls 90% of the time/requires cueing < 10% of the time      Medical Problem List and Plan: 1. 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 DVT Prophylaxis/Anticoagulation: Subcutaneous Lovenox. Monitor platelet counts and any signs of bleeding 3. Pain Management: Tylenol as needed.   Controlled at present 4. Asthma. Continue chronic prednisone. Continue Dulera 5. Neuropsych: This patient is capable of making decisions on her own behalf. 6. Skin/Wound Care: Routine skin checks 7. Fluids/Electrolytes/Nutrition: Routine I&O   Eating 50-100% of her meals  BMP within acceptable range on 3/10  8. Hyperlipidemia. Pravachol 9. Legally blind right eye  LOS (Days) 4 A FACE TO FACE EVALUATION WAS PERFORMED  Ankit Lorie Phenix 09/17/2015 9:27 AM

## 2015-09-18 ENCOUNTER — Inpatient Hospital Stay (HOSPITAL_COMMUNITY): Payer: Medicare Other | Admitting: Physical Therapy

## 2015-09-18 ENCOUNTER — Inpatient Hospital Stay (HOSPITAL_COMMUNITY): Payer: Medicare Other | Admitting: Speech Pathology

## 2015-09-18 ENCOUNTER — Inpatient Hospital Stay (HOSPITAL_COMMUNITY): Payer: Medicare Other

## 2015-09-18 NOTE — Progress Notes (Signed)
Social Work Patient ID: Darlene Murray, female   DOB: 1921/12/01, 82 y.o.   MRN: [id number] Met with pt and husband who was here to observe pt in therapies. Discussed team conference goals-min assist level and discharge 3/21. Husband can provide the car ept will require he was helping her prior to admission to the hospital this time. Will resume [facility] upon discharge.

## 2015-09-18 NOTE — Patient Care Conference (Signed)
Inpatient RehabilitationTeam Conference and Plan of Care Update Date: 09/18/2015   Time: 11:10 AM    Patient Name: Darlene Murray      Medical Record Number: [id number]  Date of Birth: 25-Mar-1922 Sex: Female         Room/Bed: 4W12C/4W12C-01 Payor Info: Payor: MEDICARE / Plan: MEDICARE PART A AND B / Product Type: *No Product type* /    Admitting Diagnosis: B CVA  Admit Date/Time:  09/13/2015  4:33 PM Admission Comments: No comment available   Primary Diagnosis:  <principal problem not specified> Principal Problem: <principal problem not specified>  Patient Active Problem List   Diagnosis Date Noted  . Asthma, severe persistent   . Thalamic infarct, acute ([place]) 09/13/2015  . Acute left hemiparesis ([place])   . Right-sided lacunar infarction ([place])   . History of CVA with residual deficit   . Asthma, moderate persistent   . Left-sided weakness 09/11/2015  . Cerebral infarction due to unspecified mechanism   . Acromioclavicular joint arthritis 08/20/2015  . Acute prerenal azotemia 08/02/2015  . CKD (chronic kidney disease), stage III 08/02/2015  . Memory loss 06/15/2015  . Acute right-sided weakness 06/14/2015  . Spondylosis, cervical, with myelopathy 06/13/2015  . Ataxia S/P CVA 05/17/2015  . Gait disturbance, post-stroke 05/17/2015  . Sepsis ([place]) 04/23/2015  . Urinary tract infection 04/23/2015  . Acute hemorrhagic infarction of brain ([place]) 04/10/2015  . Ataxia   . History of mixed hemorraghic embolic stroke [id number]  . Weakness 04/06/2015  . Generalized weakness 04/06/2015  . Nausea with vomiting 04/06/2015  . Left ventricular diastolic dysfunction with preserved systolic function ([place]) [id number]  . Severe persistent asthma in adult steroid dependent   . Allergic rhinitis   . Hyperlipidemia   . Venous insufficiency   . HNP (herniated nucleus pulposus), lumbar   . Left ventricular hypertrophy     Expected Discharge Date: Expected Discharge Date: 09/25/15  Team Members  Present: Physician leading conference: Dr. Delice Lesch Social Worker Present: Ovidio Kin, LCSW Nurse Present: Dorien Chihuahua, RN PT Present: Raylene Everts, PT;Other (comment) Myrtie Hawk) OT Present: [name], OT;Jennifer Tamala Julian, OT SLP Present: [name], SLP PPS Coordinator present : Daiva Nakayama, RN, CRRN     Current Status/Progress Goal Weekly Team Focus  Medical   Left-sided hemiparesis with dysarthria secondary to right posterior limb and left occipital right thalamic CVA with history of CVA 2016 and right side residual weakness  Improve mobility, safety, diet  see above   Bowel/Bladder   Continent of bowel and bladder. LBM 09/16/15. Foley removed 09/18/15, pt voiding  Pt to remain continent of bowel/bladder  Monitor   Swallow/Nutrition/ Hydration     na        ADL's   Overall Mod Assist for BADL (at sink), Min-Mod transfers (toilet, bed<>w/c)  Overall Min A for BADL and transfers  Improve awareness, activity tolerance, static and dynamic sitting/standing balance, attention, memory, family education   Mobility   Mod A for squat pivot transfers, Min/mod A for sit<>stand, Min/Mod A for ambulation with RW average 15 ft, propels w/c 140 ft with supervision  Mod I bed mobility, Min A bed<>chair, Min A car transfers, Min A ambulating 25 ft with LRAD, Mod A for stair negotiation, supervision for w/c propulsion in home  finding midline in sitting/standing, improving ambulation technique & distances, increasing endurance, transfers   Communication     na        Safety/Cognition/ Behavioral Observations  Suspect mild cognitive deficits with underlying dementia  and baseline deficits from multiple previous CVAs          Pain   Oxy IR 10mg  q 4hrs, Tramaol 50mg    <3  Monitor for effectiveness   Skin   Lumbar incision, CDI, OTA  No additional skin breakdown  Monitor q shift      *See Care Plan and progress notes for long and short-term goals.  Barriers to Discharge: Safety  awareness, nutrition    Possible Resolutions to Barriers:  Pt and family edu, cont to encourage diet    Discharge Planning/Teaching Needs:  Home with husband assisting, he was prior to admission with her ADL's.      Team Discussion:  Goals of min assist level-fluctuates in her levels at times min and then other times min-guard. Dependent if motivated to do the task. Balance is better and holding mid-line more consistent. SP to see 3x week while here. Pt wants to go home. Husband here today to go through therapies with pt.  Revisions to Treatment Plan:  None   Continued Need for Acute Rehabilitation Level of Care: The patient requires daily medical management by a physician with specialized training in physical medicine and rehabilitation for the following conditions: Daily direction of a multidisciplinary physical rehabilitation program to ensure safe treatment while eliciting the highest outcome that is of practical value to the patient.: Yes Daily medical management of patient stability for increased activity during participation in an intensive rehabilitation regime.: Yes Daily analysis of laboratory values and/or radiology reports with any subsequent need for medication adjustment of medical intervention for : Neurological problems  Abeer Deskins, [name] 09/18/2015, 3:19 PM

## 2015-09-18 NOTE — Progress Notes (Signed)
Occupational Therapy Session Note  Patient Details  Name: Arielis Hinzman MRN: [id number] Date of Birth: Dec 13, 1921  Today's Date: 09/18/2015 OT Individual Time: 1030-1100 OT Individual Time Calculation (min): 30 min   Short Term Goals: Week 1:  OT Short Term Goal 1 (Week 1): Pt will complete upper body dressing with min assist OT Short Term Goal 2 (Week 1): Pt will complete lower body dressing with mod assist OT Short Term Goal 3 (Week 1): Pt will bathe seated on bench with min assist OT Short Term Goal 4 (Week 1): Pt will complete clothing mangagement for toileting with supervision OT Short Term Goal 5 (Week 1): Pt will groom seated at sink with setup and supervision  Skilled Therapeutic Interventions/Progress Updates: Therapeutic activity with focus on improved sit<>stand, activity tolerance, functional mobility using RW, transfers.   Pt received seated in w/c awaiting therapist.   Pt requires mod vc to engage in activity and self-assess her deficits and needs.   Pt agreed to ambulate to sink from w/c, approx 10', and rose from w/c with mod assist to lift (pt=90%).   Pt ambulates with min guard assist and vc to advance.   Pt requests rest break prior to reaching sink and is then escorted to ADL apt to practice tub/shower transfer.   After demo from therapist, pt completes mobility from doorway to tub bench, transfers to bench bringing both legs into bath tub, and returns to w/c with extra time and setup to place RW.  Pt escorted back to her room at end of session.     Therapy Documentation Precautions:  Precautions Precautions: Cervical, Fall Restrictions Weight Bearing Restrictions: No  Vital Signs: Therapy Vitals Pulse Rate: 76 BP: (!) 112/57 mmHg Patient Position (if appropriate): Sitting Oxygen Therapy SpO2: 98 % O2 Device: Not Delivered Pulse Oximetry Type: Intermittent   Pain: Pain Assessment Pain Assessment: No/denies pain   ADL: ADL ADL Comments: see Functional  Assessment Tool   See Function Navigator for Current Functional Status.   Therapy/Group: Individual Therapy   Second session: Time: 1130-1200 Time Calculation (min):  30 min  Pain: Pain Assessment Pain Assessment: No/denies pain   Skilled Therapeutic Interventions: Therapeutic activity with focus on family ed, dynamic standing balance, activity tolerance, and improved awareness and attention.   Pt received with husband in room awaiting therapist.   With mod vc to engage in planned activity, pt escorted to meeting room and instructed to stand and fold towels presented.   Pt complied with protest but maintained standing balance for 5 minutes while folding 3 towels.   Pt returned to rest in w/c and folded an additional 2 towels while husband provided continuous comments and distractions.   With incentive provided to terminate session, pt stood and attempted to fold one clothing item but was unable to lift both hands off table top to complete task d/t left lateral lean increasing with fatigue.   Overall pt completed sit<>stand 3 times during session with min assist to maintain standing balance while folding towels and clothing.   Pt returned to her room to await lunch with husband assisting.   See FIM for current functional status  Therapy/Group: Individual Therapy  [place] 09/18/2015, 1:06 PM

## 2015-09-18 NOTE — Progress Notes (Signed)
Physical Therapy Session Note  Patient Details  Name: Darlene Murray MRN: [id number] Date of Birth: 1921/07/26  Today's Date: 09/18/2015 PT Individual Time: 5621-3086 and 1529-1600 PT Individual Time Calculation (min): 45 min and 31 minutes   Short Term Goals: Week 1:  PT Short Term Goal 1 (Week 1): Pt will be able to ambulate 15 ft with LRAD & Mod A. PT Short Term Goal 2 (Week 1): Pt will complete squat pivot transfers with Min A & LRAD. PT Short Term Goal 3 (Week 1): Pt will perform sit<>stand transfers with Min A.  Skilled Therapeutic Interventions/Progress Updates:     Treatment 1: Pt received in w/c & agreeable to PT. Pt completed multiple sit<>stand with CGA to donn pants. Pt with L lateral lean in standing, attempted to complete donning new pants but demonstrated decreased balance with task. Pt transported to rehab apartment via w/c total A for time management. Pt ambulated 10 ft with RW & CGA with cuing for step length & width LLE. Pt transferred supine<>sit on elevated bed with supervision, supervision for rolling R & Min A for rolling left. PT cuing pt for hand placement & sequencing to allow pt to roll L with increased ease. Pt then ambulated 10 ft bed>w/c with RW & CGA, requiring max verbal cuing for turning and to back up completely to chair before sitting. At end of session pt was left in w/c in room with QRB in place & all needs within reach.  Treatment 2: Pt received in w/c, husband present, & pt agreeable to PT. PT educated husband on pt's need for assistance upon d/c home, with amount of assistance depending upon pt's motivation/willingness to perform tasks. Husband demonstrated home set up and pt's previous method of transferring w/c>bed. PT educated pt on need to move [facility] out of bedroom area to allow pt to park w/c closer to [facility]. Educated pt to lock w/c at an angle to bed & remove armrest closest to bed. Pt required verbal cuing for sit>stand and maximum verbal & tactile cuing for  hand placement. Once standing pt required maximum tactile cuing for turning and hand placement to turn buttocks towards bed. PT educated pt on bathroom transfers, as pt has to lock w/c directly in front of toilet. Educated pt on need for [facility] to create elevated surface for pt to sit on in bathroom. Pt completed sit>stand transfer and turned 180 degrees to sit on toilet but required maximum verbal & tactile cuing for hand placement. At end of session pt left in bed with all needs met & within reach. Pt's husband reports he can provide minimal assistance to pt at home; husband & pt willing to participate in family education to increase their safety at home upon pt's d/c.  Therapy Documentation Precautions:  Precautions Precautions: Fall Restrictions Weight Bearing Restrictions: No  Vital Signs: Therapy Vitals Pulse Rate: 76 BP: (!) 112/57 mmHg Patient Position (if appropriate): Sitting Oxygen Therapy SpO2: 98 % O2 Device: Not Delivered Pulse Oximetry Type: Intermittent Pain: Pain Assessment Pain Assessment: No/denies pain   See Function Navigator for Current Functional Status.   Therapy/Group: Individual Therapy  Waunita Schooner 09/18/2015, 12:42 PM

## 2015-09-18 NOTE — Plan of Care (Signed)
Problem: RH BOWEL ELIMINATION Goal: RH STG MANAGE BOWEL WITH ASSISTANCE STG Manage Bowel with minimal Assistance.  Outcome: Not Progressing No bm x 3 d

## 2015-09-18 NOTE — Evaluation (Signed)
Speech Language Pathology Assessment and Plan  Patient Details  Name: Darlene Murray MRN: [id number] Date of Birth: 1921/08/10  SLP Diagnosis: Cognitive Impairments  Rehab Potential: Fair ELOS: anticipated d/c of 3/21    Today's Date: 09/18/2015 SLP Individual Time: 0800-0900 SLP Individual Time Calculation (min): 60 min   Problem List:  Patient Active Problem List   Diagnosis Date Noted  . Asthma, severe persistent   . Thalamic infarct, acute ([facility]) 09/13/2015  . Acute left hemiparesis ([facility])   . Right-sided lacunar infarction ([facility])   . History of CVA with residual deficit   . Asthma, moderate persistent   . Left-sided weakness 09/11/2015  . Cerebral infarction due to unspecified mechanism   . Acromioclavicular joint arthritis 08/20/2015  . Acute prerenal azotemia 08/02/2015  . CKD (chronic kidney disease), stage III 08/02/2015  . Memory loss 06/15/2015  . Acute right-sided weakness 06/14/2015  . Spondylosis, cervical, with myelopathy 06/13/2015  . Ataxia S/P CVA 05/17/2015  . Gait disturbance, post-stroke 05/17/2015  . Sepsis ([facility]) 04/23/2015  . Urinary tract infection 04/23/2015  . Acute hemorrhagic infarction of brain ([facility]) 04/10/2015  . Ataxia   . History of mixed hemorraghic embolic stroke 04/07/2015  . Weakness 04/06/2015  . Generalized weakness 04/06/2015  . Nausea with vomiting 04/06/2015  . Left ventricular diastolic dysfunction with preserved systolic function ([facility]) 12/28/2014  . Severe persistent asthma in adult steroid dependent   . Allergic rhinitis   . Hyperlipidemia   . Venous insufficiency   . HNP (herniated nucleus pulposus), lumbar   . Left ventricular hypertrophy    Past Medical History:  Past Medical History  Diagnosis Date  . Blindness   . Asthmatic bronchitis   . Asthma   . Fibrocystic breast disease   . Allergic rhinitis   . Hyperlipidemia   . Venous insufficiency     lower extremities  . HNP (herniated nucleus pulposus), lumbar    L4-L5  . Left ventricular hypertrophy 2008    mild  . Antibiotic-associated diarrhea   . Stroke (cerebrum) [facility])    Past Surgical History:  Past Surgical History  Procedure Laterality Date  . Cataract extraction    . Colonoscopy      Assessment / Plan / Recommendation Clinical Impression  Darlene Murray is a 82 y.o. right handed female with history asthma maintained on prednisone,  venous insufficiency and previous CVA with right-sided residual weakness and received inpatient rehabilitation services October 2016.  Presented 09/11/2015 with left-sided weakness and slurred speech. MRI of the brain shows acute nonhemorrhagic infarct right thalamus and posterior limb right internal capsule. Tiny acute medial left occipital lobe infarct. Remote bilateral cerebellar infarcts. Patient was admitted for comprehensive rehabilitation program on 09/14/2015.  SLP evaluation completed on 09/18/2015 with the following results:  Pt presents with suspected new mild cognitive deficits exacerbated by underlying baseline impairment from previous CVAs and advanced age.   Pt struggled the most with immediate memory, recent memory, and temporal orientation subtests of RIPA-G standardized cognitive assessment, although a normed score was unable to be attained due to cuing needed to complete evaluation.  Pt provided conflicting information from initial cognitive evaluation completed on acute care and reported that she did not manage her own medications and finances and she also endorsed that she did not drive prior to admission.  No family present to verify baseline cognitive status.  Pt would benefit from brief ST follow up while inpatient 3x/week to determine baseline and provide education for compensatory strategy use given that pt  will likely have long term deficits.    Skilled Therapeutic Interventions          Cognitive-linguistic evaluation completed with results and recommendations reviewed with patient. During  evaluation pt benefited from repetition of instructions to compensate for immediate recall impairments.  Pt also benefited from min-mod verbal cues for sequencing and task organization when transferring on and off bedside commode with nurse tech.  Pt was left in wheelchair and handed off to PT at the end of today's session.      SLP Assessment  Patient will need skilled [facility] during CIR admission    Recommendations  Patient destination: Home Follow up Recommendations: Other (comment);24 hour supervision/assistance (discharge recommendations TBD pending progress made while inpatient ) Equipment Recommended: None recommended by SLP    SLP Frequency 3 to 5 out of 7 days   SLP Duration  SLP Intensity  SLP Treatment/Interventions anticipated d/c of 3/21  Minumum of 1-2 x/day, 30 to 90 minutes  Cognitive remediation/compensation;Cueing hierarchy;Patient/family education;Environmental controls;Internal/external aids;Functional tasks    Pain Pain Assessment Pain Assessment: No/denies pain  Prior Functioning Cognitive/Linguistic Baseline: Baseline deficits Baseline deficit details: history of multiple strokes, evidence of global atrophy and mild small vessel changes on MRI Type of Home: House  Lives With: Spouse Available Help at Discharge: Family Education: college educated, taught English and [place] Vocation: Retired  Function:  Eating Eating                 Cognition Comprehension Comprehension assist level: Understands basic 90% of the time/cues < 10% of the time  Expression   Expression assist level: Expresses basic 90% of the time/requires cueing < 10% of the time.  Social Interaction Social Interaction assist level: Interacts appropriately 90% of the time - Needs monitoring or encouragement for participation or interaction.  Problem Solving Problem solving assist level: Solves basic 75 - 89% of the time/requires cueing 10 - 24% of the  time  Memory Memory assist level: Recognizes or recalls 50 - 74% of the time/requires cueing 25 - 49% of the time   Short Term Goals: Week 1: SLP Short Term Goal 1 (Week 1): STG=LTG due to ELOS   Refer to Care Plan for Long Term Goals  Recommendations for other services: None  Discharge Criteria: Patient will be discharged from SLP if patient refuses treatment 3 consecutive times without medical reason, if treatment goals not met, if there is a change in medical status, if patient makes no progress towards goals or if patient is discharged from hospital.  The above assessment, treatment plan, treatment alternatives and goals were discussed and mutually agreed upon: by patient  Emilio Math 09/18/2015, 1:11 PM

## 2015-09-18 NOTE — Progress Notes (Signed)
Physical Therapy Note  Patient Details  Name: Darlene Murray MRN: [id number] Date of Birth: 18-Jun-1922 Today's Date: 09/18/2015    Time: 1330-1400 30 minutes  1:1 No c/o pain.  Pt performed gait on carpet with RW and min A, 1 LOB requiring max A to correct as pt fell to Lt when fatigued. Pt with step to pattern with L LE, leans Lt in standing and during gait.  Mod  A for turns with RW on carpet.  Pt with need to toilet. Pt mod A for toilet transfers and clothing management, able to perform hygiene without assistance.  Seated therex for LE strengthening 2 x 20 hip flex, LAQ, AP.  Pt fatigues quickly but family is good at encouraging her to participate.   Drinda Belgard 09/18/2015, 2:44 PM

## 2015-09-18 NOTE — Progress Notes (Signed)
Social Work [name], LCSW Social Worker Signed  Patient Care Conference 09/18/2015  1:31 PM    Expand All Collapse All   Inpatient RehabilitationTeam Conference and Plan of Care Update Date: 09/18/2015   Time: 11:10 AM     Patient Name: Darlene Murray       Medical Record Number: [id number]  Date of Birth: 05-16-1922 Sex: Female         Room/Bed: 4W12C/4W12C-01 Payor Info: Payor: MEDICARE / Plan: MEDICARE PART A AND B / Product Type: *No Product type* /    Admitting Diagnosis: B CVA   Admit Date/Time:  09/13/2015  4:33 PM Admission Comments: No comment available   Primary Diagnosis:  <principal problem not specified> Principal Problem: <principal problem not specified>    Patient Active Problem List     Diagnosis  Date Noted   .  Asthma, severe persistent     .  Thalamic infarct, acute ([place])  09/13/2015   .  Acute left hemiparesis ([place])     .  Right-sided lacunar infarction ([place])     .  History of CVA with residual deficit     .  Asthma, moderate persistent     .  Left-sided weakness  09/11/2015   .  Cerebral infarction due to unspecified mechanism     .  Acromioclavicular joint arthritis  08/20/2015   .  Acute prerenal azotemia  08/02/2015   .  CKD (chronic kidney disease), stage III  08/02/2015   .  Memory loss  06/15/2015   .  Acute right-sided weakness  06/14/2015   .  Spondylosis, cervical, with myelopathy  06/13/2015   .  Ataxia S/P CVA  05/17/2015   .  Gait disturbance, post-stroke  05/17/2015   .  Sepsis ([place])  04/23/2015   .  Urinary tract infection  04/23/2015   .  Acute hemorrhagic infarction of brain ([place])  04/10/2015   .  Ataxia     .  History of mixed hemorraghic embolic stroke  [id number]   .  Weakness  04/06/2015   .  Generalized weakness  04/06/2015   .  Nausea with vomiting  04/06/2015   .  Left ventricular diastolic dysfunction with preserved systolic function ([place])  [id number]   .  Severe persistent asthma in adult steroid dependent     .   Allergic rhinitis     .  Hyperlipidemia     .  Venous insufficiency     .  HNP (herniated nucleus pulposus), lumbar     .  Left ventricular hypertrophy       Expected Discharge Date: Expected Discharge Date: 09/25/15  Team Members Present: Physician leading conference: Dr. Delice Lesch Social Worker Present: Ovidio Kin, LCSW Nurse Present: Dorien Chihuahua, RN PT Present: Raylene Everts, PT;Other (comment) Myrtie Hawk) OT Present: [name], OT;Jennifer Tamala Julian, OT SLP Present: [name], SLP PPS Coordinator present : Daiva Nakayama, RN, CRRN        Current Status/Progress  Goal  Weekly Team Focus   Medical     Left-sided hemiparesis with dysarthria secondary to right posterior limb and left occipital right thalamic CVA with history of CVA 2016 and right side residual weakness  Improve mobility, safety, diet  see above   Bowel/Bladder     Continent of bowel and bladder. LBM 09/16/15. Foley removed 09/18/15, pt voiding  Pt to remain continent of bowel/bladder   Monitor   Swallow/Nutrition/ Hydration       na  ADL's     Overall Mod Assist for BADL (at sink), Min-Mod transfers (toilet, bed<>w/c)  Overall Min A for BADL and transfers   Improve awareness, activity tolerance, static and dynamic sitting/standing balance, attention, memory, family education    Mobility     Mod A for squat pivot transfers, Min/mod A for sit<>stand, Min/Mod A for ambulation with RW average 15 ft, propels w/c 140 ft with supervision  Mod I bed mobility, Min A bed<>chair, Min A car transfers, Min A ambulating 25 ft with LRAD, Mod A for stair negotiation, supervision for w/c propulsion in home  finding midline in sitting/standing, improving ambulation technique & distances, increasing endurance, transfers    Communication       na         Safety/Cognition/ Behavioral Observations    Suspect mild cognitive deficits with underlying dementia and baseline deficits from multiple previous CVAs            Pain     Oxy IR 10mg  q 4hrs, Tramaol 50mg    <3  Monitor for effectiveness   Skin     Lumbar incision, CDI, OTA  No additional skin breakdown  Monitor q shift      *See Care Plan and progress notes for long and short-term goals.    Barriers to Discharge:  Safety awareness, nutrition     Possible Resolutions to Barriers:   Pt and family edu, cont to encourage diet      Discharge Planning/Teaching Needs:   Home with husband assisting, he was prior to admission with her ADL's.       Team Discussion:    Goals of min assist level-fluctuates in her levels at times min and then other times min-guard. Dependent if motivated to do the task. Balance is better and holding mid-line more consistent. SP to see 3x week while here. Pt wants to go home. Husband here today to go through therapies with pt.   Revisions to Treatment Plan:    None    Continued Need for Acute Rehabilitation Level of Care: The patient requires daily medical management by a physician with specialized training in physical medicine and rehabilitation for the following conditions: Daily direction of a multidisciplinary physical rehabilitation program to ensure safe treatment while eliciting the highest outcome that is of practical value to the patient.: Yes Daily medical management of patient stability for increased activity during participation in an intensive rehabilitation regime.: Yes Daily analysis of laboratory values and/or radiology reports with any subsequent need for medication adjustment of medical intervention for : Neurological problems  [name] 09/18/2015, 3:19 PM                 [name], LCSW Social Worker Signed  Patient Care Conference 04/18/2015  1:19 PM    Expand All Collapse All   Inpatient RehabilitationTeam Conference and Plan of Care Update Date: 04/18/2015   Time: 10; 22 AM     Patient Name: Darlene Murray       Medical Record Number: [id number]  Date of Birth: [birth year] Sex:  Female         Room/Bed: 4M03C/4M03C-01 Payor Info: Payor: MEDICARE / Plan: MEDICARE PART A AND B / Product Type: *No Product type* /    Admitting Diagnosis: b Cvas    Admit Date/Time:  04/10/2015  2:13 PM Admission Comments: No comment available   Primary Diagnosis:  Ataxia Principal Problem: Ataxia    Patient Active Problem List     Diagnosis  Date Noted   .  Acute hemorrhagic infarction of brain ([place])  04/10/2015   .  Ataxia     .  Cerebrovascular accident (CVA) due to embolism ([place])  04/07/2015   .  Weakness  04/06/2015   .  Generalized weakness  04/06/2015   .  Nausea with vomiting  04/06/2015   .  Diastolic dysfunction  [id number]   .  Severe persistent asthma in adult steroid dependent     .  Allergic rhinitis     .  Hyperlipidemia     .  Venous insufficiency     .  HNP (herniated nucleus pulposus), lumbar     .  Left ventricular hypertrophy       Expected Discharge Date: Expected Discharge Date: 04/20/15  Team Members Present: Physician leading conference: Dr. Alysia Penna Social Worker Present: Ovidio Kin, LCSW Nurse Present: Heather Roberts, RN PT Present: Georjean Mode, PT OT Present: Clyda Greener, OT PPS Coordinator present : Daiva Nakayama, RN, CRRN        Current Status/Progress  Goal  Weekly Team Focus   Medical     Constipation, some toe pain reported by OT, now voiding   home with family  D/C planning   Bowel/Bladder     Pt. is voidding,Still doing PVR's.LBM 10/9   Pt. will manage bladder and bowel with min. assisst.   Continuing timed toileting Q 2 hrs and PRN.   Swallow/Nutrition/ Hydration       na         ADL's     supervision for UB and LB selfcare sit to stand, supervision for toilet transfers and shower transfers with use of the RW.  RUE weakness, more at the shoulder than elbow and hand.  Slight ataxia noted with UE use as well.   supervision overall  selfcare retraining, transfer training, neuromuscular re-education therapeutic activities,  therapeutic exercise, pt/family education    Mobility     supervision/CGA transfers, gait and stairs with RW  mod I bed mobility, supervision transfers, gait home/community, stairs  activity tolerance, dynamic standing balance, LE strength    Communication       na         Safety/Cognition/ Behavioral Observations    Pt. following the safety plan and call for assisstance.  Pt. will be free from falls and injury with min. assisst.   Encourage use of safety precautions.    Pain     No complains of pain.  Pain levels less than 3,on scale 1 to 10.  Assess for pain levels Q 2-3 hrs. and PRN   Skin     Bilateral edema to lower extremities.   Pt. will be free from breakdown and infections.         *See Care Plan and progress notes for long and short-term goals.    Barriers to Discharge:  poor balance, fall risk Berg 38, equipment issues      Possible Resolutions to Barriers:   D/C 10/14     Discharge Planning/Teaching Needs:   Home with husband and son to assist when husband not there, both have been here to participate in therapies with pt        Team Discussion:    Meeting goals of supervision level. Berg 31/56. Voiding now.  Big toe bothering her-ingrown toenail. Constipated but had bowel movement today. Arthritis in back acting up. Work with husband on equipment   Revisions to Treatment Plan:    None  Continued Need for Acute Rehabilitation Level of Care: The patient requires daily medical management by a physician with specialized training in physical medicine and rehabilitation for the following conditions: Daily direction of a multidisciplinary physical rehabilitation program to ensure safe treatment while eliciting the highest outcome that is of practical value to the patient.: Yes Daily medical management of patient stability for increased activity during participation in an intensive rehabilitation regime.: Yes Daily analysis of laboratory values and/or radiology reports with  any subsequent need for medication adjustment of medical intervention for : Neurological problems  Vihan Santagata, [name] 04/18/2015, 1:19 PM                 [name], [place] Social Worker Signed  Patient Care Conference 04/11/2015  2:01 PM    Expand All Collapse All   Inpatient RehabilitationTeam Conference and Plan of Care Update Date: 04/11/2015   Time: 110;55 AM     Patient Name: Darlene Murray       Medical Record Number: [id number]  Date of Birth: 10/18/1921 Sex: Female         Room/Bed: 4M03C/4M03C-01 Payor Info: Payor: MEDICARE / Plan: MEDICARE PART A AND B / Product Type: *No Product type* /    Admitting Diagnosis: b Cvas    Admit Date/Time:  04/10/2015  2:13 PM Admission Comments: No comment available   Primary Diagnosis:  Ataxia Principal Problem: Ataxia    Patient Active Problem List     Diagnosis  Date Noted   .  Acute hemorrhagic infarction of brain ([place])  04/10/2015   .  Ataxia     .  Cerebrovascular accident (CVA) due to embolism ([place])  04/07/2015   .  Weakness  04/06/2015   .  Generalized weakness  04/06/2015   .  Nausea with vomiting  04/06/2015   .  Diastolic dysfunction  [id number]   .  Severe persistent asthma in adult steroid dependent     .  Allergic rhinitis     .  Hyperlipidemia     .  Venous insufficiency     .  HNP (herniated nucleus pulposus), lumbar     .  Left ventricular hypertrophy       Expected Discharge Date: Expected Discharge Date: 04/20/15  Team Members Present: Physician leading conference: Dr. Alysia Penna Social Worker Present: Ovidio Kin, LCSW Nurse Present: Heather Roberts, RN PT Present: Georjean Mode, PT OT Present: Clyda Greener, OT SLP Present: Gunnar Fusi, SLP PPS Coordinator present : Daiva Nakayama, RN, CRRN        Current Status/Progress  Goal  Weekly Team Focus   Medical     pt with right sided ataxia,   home with family assist  Iitiate therapy   Bowel/Bladder     Pt requiring I&O caths intermittently. Pt  voiding occasionally with therapy. Pt continent of bowel. LBM 04/11/15   Pt will manage bowel and bladder with min assist.   Attempt timed toileting. Offer bathroom frequently.    Swallow/Nutrition/ Hydration       na         ADL's     currently supervision for UB selfcare and min assist for LB selfcare sit to stand.  Mod assist for transfers without assistive device.  slight weakness and ataxia in the RUE, more weakness proximal than distal   supervision overall  selfcare re-training, transfer training, neuromuscular re-education, therapeutic activities, therapeutic exercise, pt/family education    Mobility       eval pending  Communication       na         Safety/Cognition/ Behavioral Observations    Pt following current safety plan and calling approiately.  Pt will remain free of falls and injury with min assist   Encourage use of safety precauitions    Pain     No complaints of pain  Manage pain at 3 or less on a scale of 0-10  Assess Pain Q4 hours and prn and treat accordingly.    Skin     Bilateral edema to lower extremities. CDI.   Prevent skin breakdown and infection   Assess skin Q shift and prn    Rehab Goals Patient on target to meet rehab goals: Yes *See Care Plan and progress notes for long and short-term goals.    Barriers to Discharge:  fear of falling     Possible Resolutions to Barriers:   cont PT/OT     Discharge Planning/Teaching Needs:     Home with husband and son checking daily on. Son is here today to observe in therapies. Pt was very active prior to admission      Team Discussion:    New eval-goals of supervision / mod/i level.  Ataxia to the right-fear of falling. Visual issues-right eye. I & O caths-will work on bladder issues. Check UA. Balance issues. Complained of pain in l-knee on acute, not now.   Revisions to Treatment Plan:    New eval    Continued Need for Acute Rehabilitation Level of Care: The patient requires daily medical management  by a physician with specialized training in physical medicine and rehabilitation for the following conditions: Daily direction of a multidisciplinary physical rehabilitation program to ensure safe treatment while eliciting the highest outcome that is of practical value to the patient.: Yes Daily medical management of patient stability for increased activity during participation in an intensive rehabilitation regime.: Yes Daily analysis of laboratory values and/or radiology reports with any subsequent need for medication adjustment of medical intervention for : Neurological problems;Other  [name] 04/12/2015, 1:35 PM                  Patient ID: Darlene Murray, female   DOB: [birth year], 82 y.o.   MRN: [id number]

## 2015-09-18 NOTE — Plan of Care (Signed)
Problem: RH Balance Goal: LTG Patient will maintain dynamic standing balance (PT) LTG: Patient will maintain dynamic standing balance with assistance during mobility activities (PT) With LRAD  Problem: RH Ambulation Goal: LTG Patient will ambulate in controlled environment (PT) LTG: Patient will ambulate in a controlled environment, # of feet with assistance (PT). With LRAD

## 2015-09-18 NOTE — Progress Notes (Signed)
[facility] PHYSICAL MEDICINE & REHABILITATION     PROGRESS NOTE  Subjective/Complaints:  Patient seen lying in bed this morning. She does not have any complaints this morning. She states she slept well overnight.  ROS: Denies CP, SOB, N/V/D  Objective: Vital Signs: Blood pressure 133/60, pulse 71, temperature 97.5 F (36.4 C), temperature source Oral, resp. rate 18, height 5\' 4"  (1.626 m), weight 73.029 kg (161 lb), SpO2 97 %. No results found. No results for input(s): WBC, HGB, HCT, PLT in the last 72 hours. No results for input(s): NA, K, CL, GLUCOSE, BUN, CREATININE, CALCIUM in the last 72 hours.  Invalid input(s): CO CBG (last 3)  No results for input(s): GLUCAP in the last 72 hours.  Wt Readings from Last 3 Encounters:  09/13/15 73.029 kg (161 lb)  08/02/15 72.576 kg (160 lb)  06/27/15 73.71 kg (162 lb 8 oz)    Physical Exam:  BP 133/60 mmHg  Pulse 71  Temp(Src) 97.5 F (36.4 C) (Oral)  Resp 18  Ht 5\' 4"  (1.626 m)  Wt 73.029 kg (161 lb)  BMI 27.62 kg/m2  SpO2 97% Constitutional: She appears well-developed and well-nourished. NAD. Vital signs reviewed.  HENT: Normocephalic and atraumatic.  Eyes: Conjunctivae and EOM are normal. No scleral icterus.   Cardiovascular: Normal rate and regular rhythm.  Respiratory: Effort normal and breath sounds normal. No respiratory distress.  GI: Soft. Bowel sounds are normal. She exhibits no distension.  Musculoskeletal: She exhibits no edema or tenderness.  Neurological:  She is alert and oriented x3 with increased time and cues Follows simple commands. Motor: LUE/LLE: 5/5 RUE: 4+/5 shoulder abduction, elbow flexion/extensionm, 4+/5 hand grip RLE: 4+/5 hip flexion, 5/5 knee extension, , ankle dorsi/plantarflexion  Skin: Skin is warm and dry.  Psychiatric: She has a normal mood and affect. Her behavior is normal. Thought content normal  Assessment/Plan: 1. Functional deficits secondary to right posterior limb and left  occipital right thalamic CVA with history of CVA 2016 and right side residual weakness which require 3+ hours per day of interdisciplinary therapy in a comprehensive inpatient rehab setting. Physiatrist is providing close team supervision and 24 hour management of active medical problems listed below. Physiatrist and rehab team continue to assess barriers to discharge/monitor patient progress toward functional and medical goals.  Function:  Bathing Bathing position   Position: Standing at sink  Bathing parts Body parts bathed by patient: Right arm, Left arm, Chest, Abdomen, Front perineal area, Buttocks Body parts bathed by helper: Back  Bathing assist Assist Level:  (helper did all but face arms and hands)      Upper Body Dressing/Undressing Upper body dressing   What is the patient wearing?: Bra, Pull over shirt/dress Bra - Perfomed by patient: Thread/unthread right bra strap, Thread/unthread left bra strap, Hook/unhook bra (pull down sports bra) Bra - Perfomed by helper: Hook/unhook bra (pull down sports bra) Pull over shirt/dress - Perfomed by patient: Thread/unthread left sleeve, Put head through opening, Pull shirt over trunk Pull over shirt/dress - Perfomed by helper: Thread/unthread right sleeve Button up shirt - Perfomed by patient: Thread/unthread right sleeve, Thread/unthread left sleeve Button up shirt - Perfomed by helper: Pull shirt around back (button n/a)    Upper body assist Assist Level: Touching or steadying assistance(Pt > 75%)      Lower Body Dressing/Undressing Lower body dressing   What is the patient wearing?: Underwear, Pants, Socks, Shoes, [employer]  - Performed by patient: Thread/unthread right underwear leg, Thread/unthread left underwear leg,  Pull underwear up/down Underwear - Performed by helper: Pull underwear up/down Pants- Performed by patient: Thread/unthread right pants leg, Thread/unthread left pants leg, Pull pants up/down Pants- Performed  by helper: Thread/unthread left pants leg, Thread/unthread right pants leg, Pull pants up/down Non-skid slipper socks- Performed by patient: Don/doff right sock, Don/doff left sock Non-skid slipper socks- Performed by helper: Don/doff left sock, Don/doff right sock   Socks - Performed by helper: Don/doff right sock, Don/doff left sock Shoes - Performed by patient: Fasten right, Fasten left Shoes - Performed by helper: Don/doff right shoe, Don/doff left shoe       TED Hose - Performed by helper: Don/doff right TED hose, Don/doff left TED hose  Lower body assist Assist for lower body dressing:  (helper completed all, total assist)      Toileting Toileting   Toileting steps completed by patient: Performs perineal hygiene Toileting steps completed by helper: Adjust clothing prior to toileting, Adjust clothing after toileting, Performs perineal hygiene Toileting Assistive Devices: Grab bar or rail  Toileting assist Assist level: Touching or steadying assistance (Pt.75%)   Transfers Chair/bed transfer   Chair/bed transfer method: Stand pivot Chair/bed transfer assist level: Moderate assist (Pt 50 - 74%/lift or lower) Chair/bed transfer assistive device: Armrests     Locomotion Ambulation     Max distance: 25 Assist level: Touching or steadying assistance (Pt > 75%)   Wheelchair   Type: Manual Max wheelchair distance: 140 Assist Level: Supervision or verbal cues  Cognition Comprehension Comprehension assist level: Understands basic 90% of the time/cues < 10% of the time  Expression Expression assist level: Expresses basic 90% of the time/requires cueing < 10% of the time.  Social Interaction Social Interaction assist level: Interacts appropriately 90% of the time - Needs monitoring or encouragement for participation or interaction.  Problem Solving Problem solving assist level: Solves basic 90% of the time/requires cueing < 10% of the time  Memory Memory assist level: Recognizes  or recalls 90% of the time/requires cueing < 10% of the time      Medical Problem List and Plan: 1. 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 DVT Prophylaxis/Anticoagulation: Subcutaneous Lovenox. Monitor platelet counts and any signs of bleeding 3. Pain Management: Tylenol as needed.   Controlled at present 4. Asthma. Continue chronic prednisone. Continue Dulera 5. Neuropsych: This patient is capable of making decisions on her own behalf. 6. Skin/Wound Care: Routine skin checks 7. Fluids/Electrolytes/Nutrition: Routine I&O   Eating 10-100% of her meals  BMP within acceptable range on 3/10  8. Hyperlipidemia. Pravachol 9. Legally blind right eye  LOS (Days) 5 A FACE TO FACE EVALUATION WAS PERFORMED  Ankit Lorie Phenix 09/18/2015 9:04 AM

## 2015-09-19 ENCOUNTER — Inpatient Hospital Stay (HOSPITAL_COMMUNITY): Payer: Medicare Other | Admitting: Physical Therapy

## 2015-09-19 ENCOUNTER — Inpatient Hospital Stay (HOSPITAL_COMMUNITY): Payer: Medicare Other | Admitting: Occupational Therapy

## 2015-09-19 ENCOUNTER — Inpatient Hospital Stay (HOSPITAL_COMMUNITY): Payer: Medicare Other | Admitting: Speech Pathology

## 2015-09-19 NOTE — Progress Notes (Signed)
Speech Language Pathology Daily Session Note  Patient Details  Name: Veryle Dethloff MRN: [id number] Date of Birth: 06/19/1922  Today's Date: 09/19/2015 SLP Individual Time: 1130-1200 SLP Individual Time Calculation (min): 30 min  Short Term Goals: Week 1: SLP Short Term Goal 1 (Week 1): STG=LTG due to ELOS   Skilled Therapeutic Interventions:  Pt was seen for skilled ST targeting cognitive goals.  Pt was anticipating therapist's arrival and when SLP introduced herself, pt stated "I knew you were coming because it's written on this paper here," referring to her daily therapy schedule.   Pt had difficulty remembering other therapeutic activities and goals of therapies from earlier today and yesterday; therefore, SLP initiated skilled instruction regarding use of written aids to facilitated recall of daily information.  Pt required max assist question cues to identify at least one goal and activities of each specific therapy discipline.  However, once SLP provided pt with a written memory aid of basic current information she was able to recall room number and goals of therapies with min assist verbal cues.  Pt was returned to room and left in wheelchair with quick release belt donned and call bell within reach.  Continue per curretn plan of care.    Function:  Eating Eating                 Cognition Comprehension Comprehension assist level: Understands basic 90% of the time/cues < 10% of the time  Expression   Expression assist level: Expresses complex 90% of the time/cues < 10% of the time  Social Interaction Social Interaction assist level: Interacts appropriately 90% of the time - Needs monitoring or encouragement for participation or interaction.  Problem Solving Problem solving assist level: Solves basic 75 - 89% of the time/requires cueing 10 - 24% of the time  Memory Memory assist level: Recognizes or recalls 50 - 74% of the time/requires cueing 25 - 49% of the time    Pain Pain  Assessment Pain Assessment: No/denies pain  Therapy/Group: Individual Therapy  Laneta Guerin, Selinda Orion 09/19/2015, 1:00 PM

## 2015-09-19 NOTE — Progress Notes (Signed)
Physical Therapy Session Note  Patient Details  Name: Darlene Murray MRN: [id number] Date of Birth: Aug 07, 1921  Today's Date: 09/19/2015 PT Individual Time: [id number] PT Individual Time Calculation (min): 58 min   Short Term Goals: Week 1:  PT Short Term Goal 1 (Week 1): Pt will be able to ambulate 15 ft with LRAD & Mod A. PT Short Term Goal 2 (Week 1): Pt will complete squat pivot transfers with Min A & LRAD. PT Short Term Goal 3 (Week 1): Pt will perform sit<>stand transfers with Min A.  Skilled Therapeutic Interventions/Progress Updates:    Patient received supine in Bed with husband present. Patient performed bed mobility with supervision A and min cues from PT for improved positioning. PT instructed patient and educated husband in bed<>WC transfer training x 5 with CGA from PT and RW. PT provided education on proper cues for RW management and proper positioning for husband to improve safety with transfers. PT also advised husband on proper postioning of WC for safe and effective transfers to and from bed. PT instructed pt and educated husband in car transfer x 2 with CGA. Patient required mod instruction for proper sequencing of movements and hand placement for proper transfer. Husband required mild instruction for improved positioning to increase safety of transfer. Patient taken to rest room for urination with husband present for toilet transfer training. Pt required CGA and moderate verbal instruction for proper RW management and stepping pattern. Patient performed gait training within room for 15 ft with RW and CGA from PT, moderate cues required for last 5 ft for proper transfer onto bed. Patient and husband educated on the importance of safe transfers, even when patient is tired from daily activities. Patient left supine in bed with call bell within reach and bed alarm set.   Therapy Documentation Precautions:  Precautions Precautions: Cervical, Fall Restrictions Weight Bearing  Restrictions: No General:   Vital Signs: Therapy Vitals Temp: 97.6 F (36.4 C) Temp Source: Oral Pulse Rate: 75 Resp: 18 BP: 135/61 mmHg Patient Position (if appropriate): Sitting Oxygen Therapy SpO2: 97 % O2 Device: Not Delivered Pain: Pain Assessment Pain Score: 0-No pain   See Function Navigator for Current Functional Status.   Therapy/Group: Individual Therapy  Lorie Phenix 09/19/2015, 5:44 PM

## 2015-09-19 NOTE — Progress Notes (Signed)
[place] PHYSICAL MEDICINE & REHABILITATION     PROGRESS NOTE  Subjective/Complaints:  Patient seen sleeping in bed this morning. She is easily arousable. Smiles and states that she is doing well.  ROS: Denies CP, SOB, N/V/D  Objective: Vital Signs: Blood pressure 133/58, pulse 81, temperature 97.7 F (36.5 C), temperature source Oral, resp. rate 18, height 5\' 4"  (1.626 m), weight 73.029 kg (161 lb), SpO2 96 %. No results found. No results for input(s): WBC, HGB, HCT, PLT in the last 72 hours. No results for input(s): NA, K, CL, GLUCOSE, BUN, CREATININE, CALCIUM in the last 72 hours.  Invalid input(s): CO CBG (last 3)  No results for input(s): GLUCAP in the last 72 hours.  Wt Readings from Last 3 Encounters:  09/13/15 73.029 kg (161 lb)  08/02/15 72.576 kg (160 lb)  06/27/15 73.71 kg (162 lb 8 oz)    Physical Exam:  BP 133/58 mmHg  Pulse 81  Temp(Src) 97.7 F (36.5 C) (Oral)  Resp 18  Ht 5\' 4"  (1.626 m)  Wt 73.029 kg (161 lb)  BMI 27.62 kg/m2  SpO2 96% Constitutional: She appears well-developed and well-nourished. NAD. Vital signs reviewed.  HENT: Normocephalic and atraumatic.  Eyes: Conjunctivae and EOM are normal. No scleral icterus.   Cardiovascular: Normal rate and regular rhythm.  Respiratory: Effort normal and breath sounds normal. No respiratory distress.  GI: Soft. Bowel sounds are normal. She exhibits no distension.  Musculoskeletal: She exhibits no edema or tenderness.  Neurological:  She is alert and oriented x3 with increased time and cues Follows simple commands. Motor: LUE/LLE: 5/5 RUE: 4+5 shoulder abduction, elbow flexion/extensionm, 4+/5 hand grip. Ataxia. RLE: 4+/5 hip flexion, 5/5 knee extension, , ankle dorsi/plantarflexion  Skin: Skin is warm and dry.  Psychiatric: She has a normal mood and affect. Her behavior is normal. Thought content normal  Assessment/Plan: 1. Functional deficits secondary to right posterior limb and left occipital  right thalamic CVA with history of CVA 2016 and right side residual weakness which require 3+ hours per day of interdisciplinary therapy in a comprehensive inpatient rehab setting. Physiatrist is providing close team supervision and 24 hour management of active medical problems listed below. Physiatrist and rehab team continue to assess barriers to discharge/monitor patient progress toward functional and medical goals.  Function:  Bathing Bathing position   Position: Standing at sink  Bathing parts Body parts bathed by patient: Right arm, Left arm, Chest, Abdomen, Front perineal area, Buttocks Body parts bathed by helper: Back  Bathing assist Assist Level:  (helper did all but face arms and hands)      Upper Body Dressing/Undressing Upper body dressing   What is the patient wearing?: Bra, Pull over shirt/dress Bra - Perfomed by patient: Thread/unthread right bra strap, Thread/unthread left bra strap, Hook/unhook bra (pull down sports bra) Bra - Perfomed by helper: Hook/unhook bra (pull down sports bra) Pull over shirt/dress - Perfomed by patient: Thread/unthread left sleeve, Put head through opening, Pull shirt over trunk Pull over shirt/dress - Perfomed by helper: Thread/unthread right sleeve Button up shirt - Perfomed by patient: Thread/unthread right sleeve, Thread/unthread left sleeve Button up shirt - Perfomed by helper: Pull shirt around back (button n/a)    Upper body assist Assist Level: Touching or steadying assistance(Pt > 75%)      Lower Body Dressing/Undressing Lower body dressing   What is the patient wearing?: Underwear, Pants, Socks, Shoes, [employer]  - Performed by patient: Thread/unthread right underwear leg, Thread/unthread left underwear leg, Pull  underwear up/down Underwear - Performed by helper: Pull underwear up/down Pants- Performed by patient: Thread/unthread right pants leg, Thread/unthread left pants leg, Pull pants up/down Pants- Performed by  helper: Thread/unthread left pants leg, Thread/unthread right pants leg, Pull pants up/down Non-skid slipper socks- Performed by patient: Don/doff right sock, Don/doff left sock Non-skid slipper socks- Performed by helper: Don/doff left sock, Don/doff right sock   Socks - Performed by helper: Don/doff right sock, Don/doff left sock Shoes - Performed by patient: Fasten right, Fasten left Shoes - Performed by helper: Don/doff right shoe, Don/doff left shoe       TED Hose - Performed by helper: Don/doff right TED hose, Don/doff left TED hose  Lower body assist Assist for lower body dressing:  (helper completed all, total assist)      Toileting Toileting   Toileting steps completed by patient: Performs perineal hygiene Toileting steps completed by helper: Adjust clothing prior to toileting, Performs perineal hygiene, Adjust clothing after toileting Toileting Assistive Devices: Grab bar or rail  Toileting assist Assist level: Touching or steadying assistance (Pt.75%)   Transfers Chair/bed transfer   Chair/bed transfer method: Stand pivot Chair/bed transfer assist level: Touching or steadying assistance (Pt > 75%) Chair/bed transfer assistive device: Armrests     Locomotion Ambulation     Max distance: 10 ft Assist level: Touching or steadying assistance (Pt > 75%)   Wheelchair   Type: Manual Max wheelchair distance: 140 Assist Level: Supervision or verbal cues  Cognition Comprehension Comprehension assist level: Understands basic 90% of the time/cues < 10% of the time  Expression Expression assist level: Expresses complex 90% of the time/cues < 10% of the time  Social Interaction Social Interaction assist level: Interacts appropriately 90% of the time - Needs monitoring or encouragement for participation or interaction.  Problem Solving Problem solving assist level: Solves basic 75 - 89% of the time/requires cueing 10 - 24% of the time  Memory Memory assist level: Recognizes or  recalls 50 - 74% of the time/requires cueing 25 - 49% of the time      Medical Problem List and Plan: 1. 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 DVT Prophylaxis/Anticoagulation: Subcutaneous Lovenox. Monitor platelet counts and any signs of bleeding 3. Pain Management: Tylenol as needed.   Controlled 4. Asthma. Continue chronic prednisone. Continue Dulera  Controlled at present 5. Neuropsych: This patient is capable of making decisions on her own behalf. 6. Skin/Wound Care: Routine skin checks 7. Fluids/Electrolytes/Nutrition: Routine I&O   Eating 10- 75 % of her meals  BMP within acceptable range on 3/10   Will order labs for Friday 8. Hyperlipidemia. Pravachol 9. Legally blind right eye  LOS (Days) 6 A FACE TO FACE EVALUATION WAS PERFORMED  Ankit Lorie Phenix 09/19/2015 8:37 AM

## 2015-09-19 NOTE — Progress Notes (Signed)
Occupational Therapy Session Note  Patient Details  Name: Darlene Murray MRN: [id number] Date of Birth: 04-Mar-1922  Today's Date: 09/19/2015 OT Individual Time: 1015-1110 OT Individual Time Calculation (min): 55 min    Short Term Goals: Week 1:  OT Short Term Goal 1 (Week 1): Pt will complete upper body dressing with min assist OT Short Term Goal 2 (Week 1): Pt will complete lower body dressing with mod assist OT Short Term Goal 3 (Week 1): Pt will bathe seated on bench with min assist OT Short Term Goal 4 (Week 1): Pt will complete clothing mangagement for toileting with supervision OT Short Term Goal 5 (Week 1): Pt will groom seated at sink with setup and supervision  Skilled Therapeutic Interventions/Progress Updates:    1:1 focus on self care retraining including bed mobility, stand pivot transfers without UE support bed<w/c<shower chair<w/c, sit to stand, dynamic sitting and standing balance, ability to access lower LEs for bathing and threading clothing tasks, functional problem solving and basic daily orientation and organization. Pt able to demonstrate min to mod A for transfers.  Mod A VC throughout session for postural control in unsupported sitting during functional tasks. Pt able to come into standing with min A- supervision with grab bar or sink to pull up on bar. Pt with extra time needed and constant min to mod A when pushing up with both hands on arm rests. Pt requests A for threading underwear and pants but when encouraged can assist with extra time and steadying A.   Pt thought it was October.  Reorganized her schedule for ease of reading and to help with daily organizing.   Therapy Documentation Precautions:  Precautions Precautions: Cervical, Fall Restrictions Weight Bearing Restrictions: No General:   Vital Signs: Oxygen Therapy SpO2: 96 % O2 Device: Not Delivered Pain:  no c/o pain in session  ADL: ADL ADL Comments: see Functional Assessment Tool  See  Function Navigator for Current Functional Status.   Therapy/Group: Individual Therapy  [place], [facility] 09/19/2015, 11:31 AM

## 2015-09-19 NOTE — Progress Notes (Signed)
Physical Therapy Session Note  Patient Details  Name: Darlene Murray MRN: [id number] Date of Birth: [birth year]  Today's Date: 09/19/2015 PT Individual Time: [id number] PT Individual Time Calculation (min): 55 min   Short Term Goals: Week 1:  PT Short Term Goal 1 (Week 1): Pt will be able to ambulate 15 ft with LRAD & Mod A. PT Short Term Goal 2 (Week 1): Pt will complete squat pivot transfers with Min A & LRAD. PT Short Term Goal 3 (Week 1): Pt will perform sit<>stand transfers with Min A.  Skilled Therapeutic Interventions/Progress Updates:   Session focused on functional transfers, ambulation, balance, postural control, and activity tolerance. Patient's spouse reported he would like to observe session when PT offered to initiate hands-on family training with patient and spouse. Patient propelled wheelchair using BUE x 50 ft with supervision and verbal cues for efficient technique, gait training using RW x 10 ft + 10 ft + 20 ft with min-mod A and progressive lateral lean to L with verbal/visual cues for safe L foot placement and midline positioning, squat pivot transfer training with S-min A to R and max A to L and stand pivot transfer training to L and R with min-mod A using RW to 24" mat table to simulate transfers to bed in home environment with max multimodal cues for sequencing, and toilet transfer using grab bar with min A, min-mod A for standing balance while patient managed clothing and able to complete hygiene without assistance. Patient transferred sit > supine with supervision and left with husband present and needs within reach. Patient required max encouragement from husband and PT to participate in all therapeutic tasks this date.   Therapy Documentation Precautions:  Precautions Precautions: Cervical, Fall Restrictions Weight Bearing Restrictions: No Pain: Pain Assessment Pain Assessment: No/denies pain Pain Score: 0-No pain  See Function Navigator for Current Functional  Status.   Therapy/Group: Individual Therapy  Laretta Alstrom 09/19/2015, 1:58 PM

## 2015-09-20 ENCOUNTER — Inpatient Hospital Stay (HOSPITAL_COMMUNITY): Payer: Medicare Other

## 2015-09-20 ENCOUNTER — Ambulatory Visit: Payer: Medicare Other | Admitting: Neurology

## 2015-09-20 ENCOUNTER — Inpatient Hospital Stay (HOSPITAL_COMMUNITY): Payer: Medicare Other | Admitting: Physical Therapy

## 2015-09-20 ENCOUNTER — Inpatient Hospital Stay (HOSPITAL_COMMUNITY): Payer: Medicare Other | Admitting: Speech Pathology

## 2015-09-20 LAB — CREATININE, SERUM
Creatinine, Ser: 1.11 mg/dL — ABNORMAL HIGH (ref 0.44–1.00)
GFR calc non Af Amer: 42 mL/min — ABNORMAL LOW (ref >60)
GFR, EST AFRICAN AMERICAN: 49 mL/min — AB (ref >60)

## 2015-09-20 NOTE — Progress Notes (Signed)
Speech Language Pathology Daily Session Note  Patient Details  Name: Darlene Murray MRN: [id number] Date of Birth: 03-20-1922  Today's Date: 09/20/2015  SLP Individual Time: 52-900 SLP Individual Time Calculation (min): 60 min SLP Individual Time: 1500-1520 SLP Individual Time Calculation (min): 20 min  Short Term Goals: Week 1: SLP Short Term Goal 1 (Week 1): STG=LTG due to ELOS   Skilled Therapeutic Interventions: Ptseen x 2. Visit 1: Pt able to recall items from breakfast tray with increased time. Pt reported that she typically does all of the cooking at home, but required max A for basic meal planning/functional sequencing for a pt proposed dish. Sequencing 3 pictures of functional activities completed with 100% acc. Mod  A for basic coin counting. Pt able to recall basic biographical information with increased time and mod A for clarifying questions. Visit 2: Completed with pt's son [name] and spouse Jenny Reichmann. They report that pt has not completed any cooking/cleaning or financial management activities since her stroke in October. Pt typically manages a calendar with appointments, but has had difficulty reading her schedule this hospitalization. Pt reported that she did not receive her lunch tray, however her son reported this was untrue. With mod cueing pt was able to recall items on lunch tray. Will focus therapy on recall of biographical information, use of written language for recall strategy and use of calendar. Pt's family verbalized agreement.   Function:    Cognition Comprehension Comprehension assist level: Understands basic 75 - 89% of the time/ requires cueing 10 - 24% of the time  Expression   Expression assist level: Expresses basic 90% of the time/requires cueing < 10% of the time.  Social Interaction Social Interaction assist level: Interacts appropriately 90% of the time - Needs monitoring or encouragement for participation or interaction.  Problem Solving Problem solving  assist level: Solves basic 50 - 74% of the time/requires cueing 25 - 49% of the time  Memory Memory assist level: Recognizes or recalls 25 - 49% of the time/requires cueing 50 - 75% of the time    Pain Pain Assessment Pain Assessment: No/denies pain  Therapy/Group: Individual [place] 09/20/2015, 3:33 PM

## 2015-09-20 NOTE — Progress Notes (Signed)
Physical Therapy Session Note  Patient Details  Name: Darlene Murray MRN: [id number] Date of Birth: 11/06/1921  Today's Date: 09/20/2015 PT Individual Time: [id number] PT Individual Time Calculation (min): 73 min   Short Term Goals: Week 1:  PT Short Term Goal 1 (Week 1): Pt will be able to ambulate 15 ft with LRAD & Mod A. PT Short Term Goal 2 (Week 1): Pt will complete squat pivot transfers with Min A & LRAD. PT Short Term Goal 3 (Week 1): Pt will perform sit<>stand transfers with Min A. Week 2:    Week 3:     Skilled Therapeutic Interventions/Progress Updates:    Patient received sitting in [facility] with son present in room. Patient agreeable to PT. PT fitted patient for new WC with wider seat and shorter floor to seat height to allow use of LE for propulsion and better fit in hips. WC mobility for 75 ft with BUE and BLE propulsion, moderate cues from PT for improved navigation through doors and to avoid wall in hall. Nustep, LE only x 6 minutes with cues for improved posture and proper pacing to prevent increased fatige. WC mobility in solarium for 75 ft over carpet and tile floor, moderate cues for obstacle navigation and min A for threshold into sun room. Gait training in solarium for 25 ft on tile and carpet with Min A from PT and cues for improved step through gait pattern and safety with turns. Seated therex: BLE marches x 10, BLE LAQ x 10, BLE Ankle pumps x 10. Sit to stand throughout treatment session from various heights with min A from PT with cues for improved forward weight shift. Pt returned to room in [facility] with son present and call bell within reach.    Therapy Documentation Precautions:  Precautions Precautions: Cervical, Fall Restrictions Weight Bearing Restrictions: No General:   Vital Signs: Therapy Vitals Temp: 97.7 F (36.5 C) Temp Source: Oral Pulse Rate: 75 Resp: 17 BP: (!) 120/55 mmHg Patient Position (if appropriate): Sitting Oxygen Therapy SpO2: 97 % O2 Device:  Not Delivered Pain: Pain Assessment Pain Assessment: No/denies pain    See Function Navigator for Current Functional Status.   Therapy/Group: Individual Therapy  Lorie Phenix 09/20/2015, 5:29 PM

## 2015-09-20 NOTE — Progress Notes (Signed)
[facility] PHYSICAL MEDICINE & REHABILITATION     PROGRESS NOTE  Subjective/Complaints:  Pt sitting up in bed this AM.  She wants to know the results of her blood work.   ROS: Denies CP, SOB, N/V/D  Objective: Vital Signs: Blood pressure 137/56, pulse 61, temperature 98.5 F (36.9 C), temperature source Oral, resp. rate 16, height 5\' 4"  (1.626 m), weight 73.029 kg (161 lb), SpO2 98 %. No results found. No results for input(s): WBC, HGB, HCT, PLT in the last 72 hours.  Recent Labs  09/20/15 0537  CREATININE 1.11*   CBG (last 3)  No results for input(s): GLUCAP in the last 72 hours.  Wt Readings from Last 3 Encounters:  09/13/15 73.029 kg (161 lb)  08/02/15 72.576 kg (160 lb)  06/27/15 73.71 kg (162 lb 8 oz)    Physical Exam:  BP 137/56 mmHg  Pulse 61  Temp(Src) 98.5 F (36.9 C) (Oral)  Resp 16  Ht 5\' 4"  (1.626 m)  Wt 73.029 kg (161 lb)  BMI 27.62 kg/m2  SpO2 98% Constitutional: She appears well-developed and well-nourished. NAD. Vital signs reviewed.  HENT: Normocephalic and atraumatic.  Eyes: Conjunctivae and EOM are normal. No scleral icterus.   Cardiovascular: Normal rate and regular rhythm.  Respiratory: Effort normal and breath sounds normal. No respiratory distress.  GI: Soft. Bowel sounds are normal. She exhibits no distension.  Musculoskeletal: She exhibits no edema or tenderness.  Neurological:  She is alert and oriented x3 with cues Follows simple commands. Motor: LUE/LLE: 5/5 RUE: 4+5 shoulder abduction, elbow flexion/extensionm, 4+/5 hand grip. Ataxia. RLE: 4+/5 hip flexion, 5/5 knee extension, ankle dorsi/plantarflexion  Skin: Skin is warm and dry.  Psychiatric: She has a normal mood and affect. Her behavior is normal. Thought content normal  Assessment/Plan: 1. Functional deficits secondary to right posterior limb and left occipital right thalamic CVA with history of CVA 2016 and right side residual weakness which require 3+ hours per day of  interdisciplinary therapy in a comprehensive inpatient rehab setting. Physiatrist is providing close team supervision and 24 hour management of active medical problems listed below. Physiatrist and rehab team continue to assess barriers to discharge/monitor patient progress toward functional and medical goals.  Function:  Bathing Bathing position   Position: Standing at sink  Bathing parts Body parts bathed by patient: Right arm, Left arm, Chest, Abdomen, Front perineal area, Buttocks Body parts bathed by helper: Back  Bathing assist Assist Level:  (helper did all but face arms and hands)      Upper Body Dressing/Undressing Upper body dressing   What is the patient wearing?: Bra, Pull over shirt/dress Bra - Perfomed by patient: Thread/unthread right bra strap, Thread/unthread left bra strap, Hook/unhook bra (pull down sports bra) Bra - Perfomed by helper: Hook/unhook bra (pull down sports bra) Pull over shirt/dress - Perfomed by patient: Thread/unthread left sleeve, Put head through opening, Pull shirt over trunk Pull over shirt/dress - Perfomed by helper: Thread/unthread right sleeve Button up shirt - Perfomed by patient: Thread/unthread right sleeve, Thread/unthread left sleeve Button up shirt - Perfomed by helper: Pull shirt around back (button n/a)    Upper body assist Assist Level: Touching or steadying assistance(Pt > 75%)      Lower Body Dressing/Undressing Lower body dressing   What is the patient wearing?: Underwear, Pants, Socks, Shoes, [employer]  - Performed by patient: Thread/unthread right underwear leg, Thread/unthread left underwear leg, Pull underwear up/down Underwear - Performed by helper: Pull underwear up/down Pants- Performed by  patient: Thread/unthread right pants leg, Thread/unthread left pants leg, Pull pants up/down Pants- Performed by helper: Thread/unthread left pants leg, Thread/unthread right pants leg, Pull pants up/down Non-skid slipper socks-  Performed by patient: Don/doff right sock, Don/doff left sock Non-skid slipper socks- Performed by helper: Don/doff left sock, Don/doff right sock   Socks - Performed by helper: Don/doff right sock, Don/doff left sock Shoes - Performed by patient: Fasten right, Fasten left Shoes - Performed by helper: Don/doff right shoe, Don/doff left shoe       TED Hose - Performed by helper: Don/doff right TED hose, Don/doff left TED hose  Lower body assist Assist for lower body dressing:  (helper completed all, total assist)      Toileting Toileting   Toileting steps completed by patient: Performs perineal hygiene Toileting steps completed by helper: Adjust clothing prior to toileting, Adjust clothing after toileting Toileting Assistive Devices: Grab bar or rail  Toileting assist Assist level: Touching or steadying assistance (Pt.75%)   Transfers Chair/bed transfer   Chair/bed transfer method: Stand pivot, Squat pivot Chair/bed transfer assist level: Touching or steadying assistance (Pt > 75%) Chair/bed transfer assistive device: Armrests, Medical sales representative     Max distance: 12ft Assist level: Touching or steadying assistance (Pt > 75%)   Wheelchair   Type: Manual Max wheelchair distance: 50 Assist Level: Supervision or verbal cues  Cognition Comprehension Comprehension assist level: Understands basic 90% of the time/cues < 10% of the time  Expression Expression assist level: Expresses complex 90% of the time/cues < 10% of the time  Social Interaction Social Interaction assist level: Interacts appropriately 90% of the time - Needs monitoring or encouragement for participation or interaction.  Problem Solving Problem solving assist level: Solves basic 75 - 89% of the time/requires cueing 10 - 24% of the time  Memory Memory assist level: Recognizes or recalls 50 - 74% of the time/requires cueing 25 - 49% of the time    Medical Problem List and Plan: 1. Left-sided  hemiparesis with dysarthria secondary to right posterior limb and left occipital right thalamic CVA with history of CVA 2016 and right side residual weakness  Continue CIR 2. DVT Prophylaxis/Anticoagulation: Subcutaneous Lovenox. Monitor platelet counts and any signs of bleeding 3. Pain Management: Tylenol as needed.   Controlled 4. Asthma. Continue chronic prednisone. Continue Dulera  Controlled 5. Neuropsych: This patient is capable of making decisions on her own behalf. 6. Skin/Wound Care: Routine skin checks 7. Fluids/Electrolytes/Nutrition: Routine I&O   Eating 75-100 % of her meals  BMP within acceptable range on 3/10   Will order labs for Friday 8. Hyperlipidemia. Pravachol 9. Legally blind right eye  LOS (Days) 7 A FACE TO FACE EVALUATION WAS PERFORMED  Ankit Lorie Phenix 09/20/2015 9:58 AM

## 2015-09-20 NOTE — Progress Notes (Signed)
Occupational Therapy Session Note  Patient Details  Name: Darlene Murray MRN: [id number] Date of Birth: 1921-07-23  Today's Date: 09/20/2015 OT Individual Time: 1000-1100 OT Individual Time Calculation (min): 60 min    Short Term Goals: Week 1:  OT Short Term Goal 1 (Week 1): Pt will complete upper body dressing with min assist OT Short Term Goal 2 (Week 1): Pt will complete lower body dressing with mod assist OT Short Term Goal 3 (Week 1): Pt will bathe seated on bench with min assist OT Short Term Goal 4 (Week 1): Pt will complete clothing mangagement for toileting with supervision OT Short Term Goal 5 (Week 1): Pt will groom seated at sink with setup and supervision  Skilled Therapeutic Interventions/Progress Updates: ADL-retraining at shower level with focus on improved activity tolerance, functional mobility, sit<>stand, toileting, family education (son present) and attention.   Pt received seated in w/c in gown but receptive for bathing at shower after assist to toilet.   Pt ambulated and transferred from w/c and to/from toilet with extra time, manual placement (hands at armrest and RW), and mod assist to lift (pt=80%).   Pt progressed to tub bench and showered with overall moderate assist for thoroughness and to reach feet and buttocks.  Pt dressed sitting on BSC but was unable to reach to her feet d/t height of BSC inhibiting foot placement needed to stabilize BOS.   Pt encouraged by son throughout session and sustained attention to task with min vc, setup and mod vc to encourage continued participation.      Therapy Documentation Precautions:  Precautions Precautions: Cervical, Fall Restrictions Weight Bearing Restrictions: No  Vital Signs: Oxygen Therapy SpO2: 96 % O2 Device: Not Delivered   Pain: No/denies pain     ADL: ADL ADL Comments: see Functional Assessment Tool   See Function Navigator for Current Functional Status.   Therapy/Group: Individual  Therapy  [place] 09/20/2015, 12:43 PM

## 2015-09-21 ENCOUNTER — Inpatient Hospital Stay (HOSPITAL_COMMUNITY): Payer: Medicare Other

## 2015-09-21 ENCOUNTER — Inpatient Hospital Stay (HOSPITAL_COMMUNITY): Payer: Medicare Other | Admitting: Physical Therapy

## 2015-09-21 ENCOUNTER — Inpatient Hospital Stay (HOSPITAL_COMMUNITY): Payer: Medicare Other | Admitting: Occupational Therapy

## 2015-09-21 DIAGNOSIS — R944 Abnormal results of kidney function studies: Secondary | ICD-10-CM

## 2015-09-21 LAB — BASIC METABOLIC PANEL
Anion gap: 7 (ref 5–15)
BUN: 13 mg/dL (ref 4–21)
BUN: 13 mg/dL (ref 6–20)
CALCIUM: 8.8 mg/dL — AB (ref 8.9–10.3)
CHLORIDE: 109 mmol/L (ref 101–111)
CO2: 27 mmol/L (ref 22–32)
CREATININE: 1 mg/dL (ref 0.5–1.1)
CREATININE: 1 mg/dL (ref 0.44–1.00)
GFR, EST AFRICAN AMERICAN: 55 mL/min — AB (ref >60)
GFR, EST NON AFRICAN AMERICAN: 48 mL/min — AB (ref >60)
GLUCOSE: 81 mg/dL
Glucose, Bld: 81 mg/dL (ref 65–99)
Potassium: 3.8 mmol/L (ref 3.5–5.1)
SODIUM: 143 mmol/L (ref 135–145)
Sodium: 143 mmol/L (ref 137–147)

## 2015-09-21 LAB — CBC WITH DIFFERENTIAL/PLATELET
BASOS PCT: 0 %
Basophils Absolute: 0 10*3/uL (ref 0.0–0.1)
EOS ABS: 0.9 10*3/uL — AB (ref 0.0–0.7)
EOS PCT: 12 %
HCT: 39.7 % (ref 36.0–46.0)
HEMOGLOBIN: 12.5 g/dL (ref 12.0–15.0)
LYMPHS ABS: 2.4 10*3/uL (ref 0.7–4.0)
Lymphocytes Relative: 32 %
MCH: 27.7 pg (ref 26.0–34.0)
MCHC: 31.5 g/dL (ref 30.0–36.0)
MCV: 87.8 fL (ref 78.0–100.0)
Monocytes Absolute: 0.8 10*3/uL (ref 0.1–1.0)
Monocytes Relative: 11 %
NEUTROS PCT: 45 %
Neutro Abs: 3.3 10*3/uL (ref 1.7–7.7)
PLATELETS: 236 10*3/uL (ref 150–400)
RBC: 4.52 MIL/uL (ref 3.87–5.11)
RDW: 15.7 % — ABNORMAL HIGH (ref 11.5–15.5)
WBC: 7.4 10*3/uL (ref 4.0–10.5)

## 2015-09-21 LAB — CBC AND DIFFERENTIAL: WBC: 7.4 10^3/mL

## 2015-09-21 NOTE — Progress Notes (Signed)
Physical Therapy Note  Patient Details  Name: Darlene Murray MRN: [id number] Date of Birth: [birth year] Today's Date: 09/21/2015    Time: 1400-1427 27 minutes  1:1 No c/o pain.  Pt c/o fatigue but willing to participate.  Pt requires multiple rest breaks during session due to fatigue.  Standing tolerance activity with clothespin activity.  Pt mod A for sit to stand, min A standing balance without UE support. Pt able to stand 2 x 3.5 minutes.  Gait with RW with husband providing min A 40' in controlled environment.  Sit to stand training x 5 with pt mod A due to LE weakness and fatigue, min cuing for sequencing and safety.     [name] 09/21/2015, 2:28 PM

## 2015-09-21 NOTE — Progress Notes (Signed)
Physical Therapy Session Note  Patient Details  Name: Darlene Murray MRN: [id number] Date of Birth: 04-18-1922  Today's Date: 09/21/2015 PT Individual Time: 0801-0859 PT Individual Time Calculation (min): 58 min   Short Term Goals: Week 1:  PT Short Term Goal 1 (Week 1): Pt will be able to ambulate 15 ft with LRAD & Mod A. PT Short Term Goal 2 (Week 1): Pt will complete squat pivot transfers with Min A & LRAD. PT Short Term Goal 3 (Week 1): Pt will perform sit<>stand transfers with Min A.  Skilled Therapeutic Interventions/Progress Updates:    Patient received sitting EOB eating breakfast. Patient instructed in sitting balance to finish breakfast without UE support. MD present for 5 minutes for assessement. PT provided min A with dressing tasks for pants and shirt. Gait within room 2x 10 with RW to use restroom, Min A from PT. MinA with toilet transfer and mod A to don and doff pants for unriation. PT applied Ted hose. Stair training on 1 step x 5 with each LE. WC mobility in rehab gym for 34f, pt reports significant fatigue; min cues for improved use of LE for ease of propulsion. Throughout treatment session, patient performed sit<>stand and furniture transfer with min A and cues for proper hand placement.  Seated therex: Marches BLE x 10 Knee extension BLE x 10  Ankle pumps x 12 BLE.   Patient left in recliner with call bell and all other needs met.   Therapy Documentation Precautions:  Precautions Precautions: Fall Restrictions Weight Bearing Restrictions: No General:   Vital Signs:  Pain: Pain Assessment Pain Assessment: No/denies pain Pain Score: 0-No pain Mobility:   Locomotion :    Trunk/Postural Assessment :    Balance:   Exercises:   Other Treatments:     See Function Navigator for Current Functional Status.   Therapy/Group: Individual Therapy  ALorie Phenix3/17/2017, 9:00 AM

## 2015-09-21 NOTE — Progress Notes (Signed)
[facility] PHYSICAL MEDICINE & REHABILITATION     PROGRESS NOTE  Subjective/Complaints:  Pt sitting up at the edge of the bed about to work with PT.  She continues to sleep well overnight and believes to be slowly improving.    ROS: Denies CP, SOB, N/V/D  Objective: Vital Signs: Blood pressure 123/56, pulse 61, temperature 97.8 F (36.6 C), temperature source Oral, resp. rate 18, height 5\' 4"  (1.626 m), weight 73.029 kg (161 lb), SpO2 97 %. No results found.  Recent Labs  09/21/15 0537  WBC 7.4  HGB 12.5  HCT 39.7  PLT 236    Recent Labs  09/20/15 0537 09/21/15 0537  NA  --  143  K  --  3.8  CL  --  109  GLUCOSE  --  81  BUN  --  13  CREATININE 1.11* 1.00  CALCIUM  --  8.8*   CBG (last 3)  No results for input(s): GLUCAP in the last 72 hours.  Wt Readings from Last 3 Encounters:  09/13/15 73.029 kg (161 lb)  08/02/15 72.576 kg (160 lb)  06/27/15 73.71 kg (162 lb 8 oz)    Physical Exam:  BP 123/56 mmHg  Pulse 61  Temp(Src) 97.8 F (36.6 C) (Oral)  Resp 18  Ht 5\' 4"  (1.626 m)  Wt 73.029 kg (161 lb)  BMI 27.62 kg/m2  SpO2 97% Constitutional: She appears well-developed and well-nourished. NAD. Vital signs reviewed.  HENT: Normocephalic and atraumatic.  Eyes: Conjunctivae and EOM are normal. No scleral icterus.   Cardiovascular: Normal rate and regular rhythm.  Respiratory: Effort normal and breath sounds normal. No respiratory distress.  GI: Soft. Bowel sounds are normal. She exhibits no distension.  Musculoskeletal: She exhibits no edema or tenderness.  Neurological:  She is alert and oriented x3 with cues Follows simple commands. Motor: LUE/LLE: 5/5 RUE: 4+5 shoulder abduction, elbow flexion/extensionm, 4+/5 hand grip. Ataxia. RLE: 4-/5 hip flexion, 4-/5 knee extension, 4+/5 ankle dorsi/plantarflexion  Skin: Skin is warm and dry.  Psychiatric: She has a normal mood and affect. Her behavior is normal. Thought content normal  Assessment/Plan: 1.  Functional deficits secondary to right posterior limb and left occipital right thalamic CVA with history of CVA 2016 and right side residual weakness which require 3+ hours per day of interdisciplinary therapy in a comprehensive inpatient rehab setting. Physiatrist is providing close team supervision and 24 hour management of active medical problems listed below. Physiatrist and rehab team continue to assess barriers to discharge/monitor patient progress toward functional and medical goals.  Function:  Bathing Bathing position   Position: Shower  Bathing parts Body parts bathed by patient: Left arm, Right arm, Chest, Abdomen, Front perineal area, Right upper leg, Left upper leg Body parts bathed by helper: Buttocks, Right lower leg, Left lower leg, Back  Bathing assist Assist Level: Touching or steadying assistance(Pt > 75%)      Upper Body Dressing/Undressing Upper body dressing   What is the patient wearing?: Bra, Pull over shirt/dress Bra - Perfomed by patient: Thread/unthread right bra strap, Thread/unthread left bra strap Bra - Perfomed by helper: Hook/unhook bra (pull down sports bra) Pull over shirt/dress - Perfomed by patient: Thread/unthread right sleeve, Thread/unthread left sleeve, Put head through opening, Pull shirt over trunk Pull over shirt/dress - Perfomed by helper: Thread/unthread right sleeve Button up shirt - Perfomed by patient: Thread/unthread right sleeve, Thread/unthread left sleeve Button up shirt - Perfomed by helper: Pull shirt around back (button n/a)    Upper  body assist Assist Level: Supervision or verbal cues, Set up   Set up : To obtain clothing/put away  Lower Body Dressing/Undressing Lower body dressing   What is the patient wearing?: Underwear, Pants, Ted Hose, Non-skid slipper socks Underwear - Performed by patient: Pull underwear up/down Underwear - Performed by helper: Thread/unthread right underwear leg, Thread/unthread left underwear leg Pants-  Performed by patient: Pull pants up/down Pants- Performed by helper: Fasten/unfasten pants, Thread/unthread right pants leg, Thread/unthread left pants leg Non-skid slipper socks- Performed by patient: Don/doff right sock, Don/doff left sock Non-skid slipper socks- Performed by helper: Don/doff right sock, Don/doff left sock   Socks - Performed by helper: Don/doff right sock, Don/doff left sock Shoes - Performed by patient: Fasten right, Fasten left Shoes - Performed by helper: Don/doff right shoe, Don/doff left shoe       TED Hose - Performed by helper: Don/doff left TED hose, Don/doff right TED hose  Lower body assist Assist for lower body dressing: Touching or steadying assistance (Pt > 75%)      Toileting Toileting   Toileting steps completed by patient: Performs perineal hygiene Toileting steps completed by helper: Adjust clothing prior to toileting, Performs perineal hygiene Toileting Assistive Devices: Grab bar or rail  Toileting assist Assist level: Touching or steadying assistance (Pt.75%)   Transfers Chair/bed transfer   Chair/bed transfer method: Stand pivot, Squat pivot Chair/bed transfer assist level: Touching or steadying assistance (Pt > 75%) Chair/bed transfer assistive device: Armrests, Medical sales representative     Max distance: 25 ft Assist level: Touching or steadying assistance (Pt > 75%)   Wheelchair   Type: Manual Max wheelchair distance: 70ft Assist Level: Supervision or verbal cues  Cognition Comprehension Comprehension assist level: Understands complex 90% of the time/cues 10% of the time  Expression Expression assist level: Expresses complex 90% of the time/cues < 10% of the time  Social Interaction Social Interaction assist level: Interacts appropriately 90% of the time - Needs monitoring or encouragement for participation or interaction.  Problem Solving Problem solving assist level: Solves basic 75 - 89% of the time/requires cueing 10 -  24% of the time  Memory Memory assist level: Recognizes or recalls 50 - 74% of the time/requires cueing 25 - 49% of the time    Medical Problem List and Plan: 1. 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 DVT Prophylaxis/Anticoagulation: Subcutaneous Lovenox. Monitor platelet counts and any signs of bleeding 3. Pain Management: Tylenol as needed.   Controlled 4. Asthma. Continue chronic prednisone. Continue Dulera  Controlled 5. Neuropsych: This patient is capable of making decisions on her own behalf. 6. Skin/Wound Care: Routine skin checks 7. Fluids/Electrolytes/Nutrition: Routine I&O   Eating 75-100 % of her meals  BMP within acceptable range on 3/17, GFR decreased 8. Hyperlipidemia. Pravachol 9. Legally blind right eye  LOS (Days) 8 A FACE TO FACE EVALUATION WAS PERFORMED  Andres Vest Lorie Phenix 09/21/2015 9:39 AM

## 2015-09-21 NOTE — Progress Notes (Signed)
Occupational Therapy Session Note  Patient Details  Name: Darlene Murray MRN: [id number] Date of Birth: 1921/10/19  Today's Date: 09/21/2015 OT Individual Time: 1300-1330 OT Individual Time Calculation (min): 30 min    Short Term Goals: No STG due to remaining LOS  Skilled Therapeutic Interventions/Progress Updates:    Treatment session with focus on transfers, sit > stand, and standing balance.  Pt received in recliner finishing lunch.  Performed stand pivot transfer mod assist to w/c with increased cues for weight shift.  Engaged in sit > stand in therapy gym with cues for anterior weight shift, min assist for standing balance without UE support and encouragement to not brace BLE against mat table in standing.  Pt reports need to toilet.  Returned to room and completed toilet transfer with mod assist and use of grab bar, pt able to complete hygiene and pull pants up post toileting with steady assist for balance.  Therapy Documentation Precautions:  Precautions Precautions: Fall Restrictions Weight Bearing Restrictions: No General:    Vital Signs: Therapy Vitals Temp: 97.7 F (36.5 C) Temp Source: Oral Pulse Rate: 78 Resp: 17 BP: (!) 109/57 mmHg Patient Position (if appropriate): Sitting Oxygen Therapy SpO2: 100 % O2 Device: Not Delivered Pain:  Pt with no c/o pain  See Function Navigator for Current Functional Status.   Therapy/Group: Individual Therapy  Simonne Come 09/21/2015, 3:10 PM

## 2015-09-21 NOTE — Progress Notes (Signed)
Occupational Therapy Weekly Progress Note  Patient Details  Name: Darlene Murray MRN: [id number] Date of Birth: April 21, 1922  Beginning of progress report period: September 14, 2015 End of progress report period: September 21, 2015  Today's Date: 09/21/2015 OT Individual Time:1045-1200 75 Minutes      Patient has met 3 of 5 short term goals.  Pt now able to ambulate short distances with steadying assist and extra time but continues to require much encouragement to initiate and sustain participation in functional tasks to include bathing/dressing.   Pt's husband Jonny Ruiz and son Best boy) have been present and attentive during treatments and provide encouragement and support of improved independence.    Other family member (son Casimiro Needle) have also arrived  and are aware of need for supervision and assistance required for performance of BADL.  Patient continues to demonstrate the following deficits: Impaired mobility, impaired safety awareness, impaired activity tolerance, impaired dynamic sitting and standing balance and therefore will continue to benefit from skilled OT intervention to enhance overall performance with BADL and Reduce care partner burden.  Patient progressing toward long term goals..  Continue plan of care.  OT Short Term Goals Week 1:  OT Short Term Goal 1 (Week 1): Pt will complete upper body dressing with min assist OT Short Term Goal 1 - Progress (Week 1): Met OT Short Term Goal 2 (Week 1): Pt will complete lower body dressing with mod assist OT Short Term Goal 2 - Progress (Week 1): Met OT Short Term Goal 3 (Week 1): Pt will bathe seated on bench with min assist OT Short Term Goal 3 - Progress (Week 1): Met OT Short Term Goal 4 (Week 1): Pt will complete clothing mangagement for toileting with supervision OT Short Term Goal 4 - Progress (Week 1): Progressing toward goal OT Short Term Goal 5 (Week 1): Pt will groom seated at sink with setup and supervision OT Short Term Goal 5 -  Progress (Week 1): Progressing toward goal Week 2:  OT Short Term Goal 1 (Week 2): STG=LTG d/t short remaining LOS  Skilled Therapeutic Interventions/Progress Updates: ADL-retraining with focus on family education and discharge planning, dressing and grooming skills reinforcement, functional mobility using RW, and dynamic standing balance and safety awareness.   Family present during session (husband and 2 sons).   Pt able to rise from recliner to RW with mod assist to lift (pt = 90%) and steadying assist while ambulating 10' to closet to select clothing.   Pt requires extra time and seated rest break but returns to sink side using RW to perform sit<>stands and to groom and dress with overall min assist and extra time.   Pt completes dressing and grooming to her satisfaction after lightly washing upper body.   Pt selected change of clothing but later contradicting choices to maintain clothing present on her lower body while only changing clothing on upper body.    After review of pt's plan to access kitchen for beverages, pt was then challenged to retreive beverage from refrigerator to assess and self-appraise her performance.   Pt escorted to kitchen in w/c and was directed to demonstrate ability to retreive item from refrigerator while ambulating with her walker, as she reported she would do when discharged.   Pt attempted task with loss of balance observed while reaching outside her base of support.   Pt was unimpressed by incidental loss of balance and minimized need for assist.   OT escorted pt to family room where her sons and husband were  gathered and reported to family members all concerns relating to pt's diminished safety awareness and instability during mobility.   Family reported history of pt's impulsivity and prior falls at home.   OT concluded session with re-ed on use of available chair and bed alarms (literature previously provided and placed in pt handbook) to alert family members when pt is out  of bed or w/c.      Therapy Documentation Precautions:  Precautions Precautions: Cervical, Fall Restrictions Weight Bearing Restrictions: No   Vital Signs: Therapy Vitals Temp: 97.8 F (36.6 C) Temp Source: Oral Pulse Rate: 61 Resp: 18 BP: (!) 123/56 mmHg Patient Position (if appropriate): Lying Oxygen Therapy SpO2: 97 % O2 Device: Not Delivered   Pain: No/denies pain  ADL: ADL ADL Comments: see Functional Assessment Tool  See Function Navigator for Current Functional Status.   Therapy/Group: Individual Therapy  [place] 09/21/2015, 7:16 AM

## 2015-09-22 ENCOUNTER — Inpatient Hospital Stay (HOSPITAL_COMMUNITY): Payer: Medicare Other | Admitting: Physical Therapy

## 2015-09-22 NOTE — Progress Notes (Signed)
Physical Therapy Session Note  Patient Details  Name: Darlene Murray MRN: [id number] Date of Birth: [birth year]  Today's Date: 09/22/2015 PT Individual Time: 1130-1201 And 1318-1330 PT Individual Time Calculation (min): 31 min and 56min  Short Term Goals: Week 1:  PT Short Term Goal 1 (Week 1): Pt will be able to ambulate 15 ft with LRAD & Mod A. PT Short Term Goal 2 (Week 1): Pt will complete squat pivot transfers with Min A & LRAD. PT Short Term Goal 3 (Week 1): Pt will perform sit<>stand transfers with Min A. Week 2:     Skilled Therapeutic Interventions/Progress Updates:    session 1  Treatment focused on gait for house hold distances. Gait training in room for 10 ft prior to toileting, and 8 ft following toileting with RW and min A, as well as cues proper use of UE, improved Ad management. Patient performed all chair and toilet transfers with min A and cues for proper hand placement and improved anterior weight shift. PT was required to provided mod A to donn pants following toileting in standing with RW. Patient performed forward and backward walking for 8 ft in each direction with RW, min A from PT and cues for proper stepping technique with retro-gait and improved posture. Patient returned to room sitting in [facility] with lap tray and call bell within distance.   session 2.  Patient received sitting in [facility], [place] with husband in room. PT instructed patient in [facility] mobility with minA-Supervision A to navigate WC from room to day room. Constant cues proper BUE propulsion technique to avoid obstacles and improve turns to L and R total 125 ft with one rest break after 84ft. Patient returned to room in [facility] with husband present and call bell within reach.    Therapy Documentation Precautions:  Precautions Precautions: Fall Restrictions Weight Bearing Restrictions: No General:   Vital Signs: Oxygen Therapy SpO2: 98 % O2 Device: Not Delivered  See Function Navigator for Current Functional  Status.   Therapy/Group: Individual Therapy  Lorie Phenix 09/22/2015, 1:00 PM

## 2015-09-22 NOTE — Progress Notes (Signed)
[place] PHYSICAL MEDICINE & REHABILITATION     PROGRESS NOTE  Subjective/Complaints:  Pt sitting in bed. No complaints. Slept well. Denies pain.    ROS: Denies CP, SOB, N/V/D  Objective: Vital Signs: Blood pressure 118/59, pulse 68, temperature 98 F (36.7 C), temperature source Oral, resp. rate 18, height 5\' 4"  (1.626 m), weight 73.029 kg (161 lb), SpO2 98 %. No results found.  Recent Labs  09/21/15 0537  WBC 7.4  HGB 12.5  HCT 39.7  PLT 236    Recent Labs  09/20/15 0537 09/21/15 0537  NA  --  143  K  --  3.8  CL  --  109  GLUCOSE  --  81  BUN  --  13  CREATININE 1.11* 1.00  CALCIUM  --  8.8*   CBG (last 3)  No results for input(s): GLUCAP in the last 72 hours.  Wt Readings from Last 3 Encounters:  09/13/15 73.029 kg (161 lb)  08/02/15 72.576 kg (160 lb)  06/27/15 73.71 kg (162 lb 8 oz)    Physical Exam:  BP 118/59 mmHg  Pulse 68  Temp(Src) 98 F (36.7 C) (Oral)  Resp 18  Ht 5\' 4"  (1.626 m)  Wt 73.029 kg (161 lb)  BMI 27.62 kg/m2  SpO2 98% Constitutional: She appears well-developed and well-nourished. NAD. Vital signs reviewed.  HENT: Normocephalic and atraumatic.  Eyes: Conjunctivae and EOM are normal. No scleral icterus.   Cardiovascular: Normal rate and regular rhythm.  Respiratory: Effort normal and breath sounds normal. No respiratory distress.  GI: Soft. Bowel sounds are normal. She exhibits no distension.  Musculoskeletal: She exhibits no edema or tenderness.  Neurological:  She is alert and oriented x3 with cues Follows simple commands. Motor: LUE/LLE: 5/5 RUE: 4+5 shoulder abduction, elbow flexion/extensionm, 4+/5 hand grip. Ataxia. RLE: 4-/5 hip flexion, 4-/5 knee extension, 4+/5 ankle dorsi/plantarflexion  Skin: Skin is warm and dry.  Psychiatric: She has a normal mood and affect. Her behavior is normal. Thought content normal  Assessment/Plan: 1. Functional deficits secondary to right posterior limb and left occipital right  thalamic CVA with history of CVA 2016 and right side residual weakness which require 3+ hours per day of interdisciplinary therapy in a comprehensive inpatient rehab setting. Physiatrist is providing close team supervision and 24 hour management of active medical problems listed below. Physiatrist and rehab team continue to assess barriers to discharge/monitor patient progress toward functional and medical goals.  Function:  Bathing Bathing position   Position: Shower  Bathing parts Body parts bathed by patient: Left arm, Right arm, Chest, Abdomen, Front perineal area, Right upper leg, Left upper leg Body parts bathed by helper: Buttocks, Right lower leg, Left lower leg, Back  Bathing assist Assist Level: Touching or steadying assistance(Pt > 75%)      Upper Body Dressing/Undressing Upper body dressing   What is the patient wearing?: Bra, Pull over shirt/dress Bra - Perfomed by patient: Thread/unthread right bra strap, Thread/unthread left bra strap Bra - Perfomed by helper: Hook/unhook bra (pull down sports bra) Pull over shirt/dress - Perfomed by patient: Thread/unthread right sleeve, Thread/unthread left sleeve, Put head through opening, Pull shirt over trunk Pull over shirt/dress - Perfomed by helper: Thread/unthread right sleeve Button up shirt - Perfomed by patient: Thread/unthread right sleeve, Thread/unthread left sleeve Button up shirt - Perfomed by helper: Pull shirt around back (button n/a)    Upper body assist Assist Level: Supervision or verbal cues, Set up   Set up : To obtain  clothing/put away  Lower Body Dressing/Undressing Lower body dressing   What is the patient wearing?: Underwear, Pants, Ted Hose, Non-skid slipper socks Underwear - Performed by patient: Pull underwear up/down Underwear - Performed by helper: Thread/unthread right underwear leg, Thread/unthread left underwear leg Pants- Performed by patient: Pull pants up/down Pants- Performed by helper:  Fasten/unfasten pants, Thread/unthread right pants leg, Thread/unthread left pants leg Non-skid slipper socks- Performed by patient: Don/doff right sock, Don/doff left sock Non-skid slipper socks- Performed by helper: Don/doff right sock, Don/doff left sock   Socks - Performed by helper: Don/doff right sock, Don/doff left sock Shoes - Performed by patient: Fasten right, Fasten left Shoes - Performed by helper: Don/doff right shoe, Don/doff left shoe       TED Hose - Performed by helper: Don/doff left TED hose, Don/doff right TED hose  Lower body assist Assist for lower body dressing: Touching or steadying assistance (Pt > 75%)      Toileting Toileting   Toileting steps completed by patient: Performs perineal hygiene, Adjust clothing after toileting Toileting steps completed by helper: Adjust clothing prior to toileting Toileting Assistive Devices: Grab bar or rail  Toileting assist Assist level:  (Mod assist)   Transfers Chair/bed transfer   Chair/bed transfer method: Stand pivot Chair/bed transfer assist level: Touching or steadying assistance (Pt > 75%) Chair/bed transfer assistive device: Armrests, Medical sales representative     Max distance: 31ft Assist level: Touching or steadying assistance (Pt > 75%)   Wheelchair   Type: Manual Max wheelchair distance: 66ft Assist Level: Supervision or verbal cues  Cognition Comprehension Comprehension assist level: Understands complex 90% of the time/cues 10% of the time  Expression Expression assist level: Expresses complex 90% of the time/cues < 10% of the time  Social Interaction Social Interaction assist level: Interacts appropriately 90% of the time - Needs monitoring or encouragement for participation or interaction.  Problem Solving Problem solving assist level: Solves basic 75 - 89% of the time/requires cueing 10 - 24% of the time  Memory Memory assist level: Recognizes or recalls 50 - 74% of the time/requires cueing  25 - 49% of the time    Medical Problem List and Plan: 1. Left-sided hemiparesis with dysarthria secondary to right posterior limb and left occipital right thalamic CVA with history of CVA 2016 and right side residual weakness  -Continue CIR 2. DVT Prophylaxis/Anticoagulation: Subcutaneous Lovenox. Monitor platelet counts and any signs of bleeding 3. Pain Management: Tylenol as needed.   Controlled 4. Asthma. Continue chronic prednisone. Continue Dulera  Controlled 5. Neuropsych: This patient is capable of making decisions on her own behalf. 6. Skin/Wound Care: Routine skin checks 7. Fluids/Electrolytes/Nutrition: Routine I&O   Eating 50-100 % of her meals  BMP within acceptable range on 3/17, GFR decreased 8. Hyperlipidemia. Pravachol 9. Legally blind right eye    LOS (Days) 9 A FACE TO FACE EVALUATION WAS PERFORMED  SWARTZ,ZACHARY T 09/22/2015 11:35 AM

## 2015-09-23 ENCOUNTER — Inpatient Hospital Stay (HOSPITAL_COMMUNITY): Payer: Medicare Other | Admitting: Physical Therapy

## 2015-09-23 NOTE — Progress Notes (Signed)
[place] PHYSICAL MEDICINE & REHABILITATION     PROGRESS NOTE  Subjective/Complaints:  Up in bed. No complaints. Therapy "going well".    ROS: Denies CP, SOB, N/V/D  Objective: Vital Signs: Blood pressure 126/59, pulse 75, temperature 97.7 F (36.5 C), temperature source Oral, resp. rate 18, height 5\' 4"  (1.626 m), weight 73.029 kg (161 lb), SpO2 95 %. No results found.  Recent Labs  09/21/15 0537  WBC 7.4  HGB 12.5  HCT 39.7  PLT 236    Recent Labs  09/21/15 0537  NA 143  K 3.8  CL 109  GLUCOSE 81  BUN 13  CREATININE 1.00  CALCIUM 8.8*   CBG (last 3)  No results for input(s): GLUCAP in the last 72 hours.  Wt Readings from Last 3 Encounters:  09/13/15 73.029 kg (161 lb)  08/02/15 72.576 kg (160 lb)  06/27/15 73.71 kg (162 lb 8 oz)    Physical Exam:  BP 126/59 mmHg  Pulse 75  Temp(Src) 97.7 F (36.5 C) (Oral)  Resp 18  Ht 5\' 4"  (1.626 m)  Wt 73.029 kg (161 lb)  BMI 27.62 kg/m2  SpO2 95% Constitutional: She appears well-developed and well-nourished. NAD. Vital signs reviewed.  HENT: Normocephalic and atraumatic.  Eyes: Conjunctivae and EOM are normal. No scleral icterus.   Cardiovascular: Normal rate and regular rhythm.  Respiratory: Effort normal and breath sounds normal. No respiratory distress.  GI: Soft. Bowel sounds are normal. She exhibits no distension.  Musculoskeletal: She exhibits no edema or tenderness.  Neurological:  She is alert and oriented x3 with cues Follows simple commands. Motor: LUE/LLE: 5/5 RUE: 4+5 shoulder abduction, elbow flexion/extensionm, 4+/5 hand grip. Ataxia. RLE: 4-/5 hip flexion, 4-/5 knee extension, 4+/5 ankle dorsi/plantarflexion  Skin: Skin is warm and dry.  Psychiatric: She has a normal mood and affect. Her behavior is normal. Thought content normal  Assessment/Plan: 1. Functional deficits secondary to right posterior limb and left occipital right thalamic CVA with history of CVA 2016 and right side  residual weakness which require 3+ hours per day of interdisciplinary therapy in a comprehensive inpatient rehab setting. Physiatrist is providing close team supervision and 24 hour management of active medical problems listed below. Physiatrist and rehab team continue to assess barriers to discharge/monitor patient progress toward functional and medical goals.  Function:  Bathing Bathing position   Position: Shower  Bathing parts Body parts bathed by patient: Left arm, Right arm, Chest, Abdomen, Front perineal area, Right upper leg, Left upper leg Body parts bathed by helper: Buttocks, Right lower leg, Left lower leg, Back  Bathing assist Assist Level: Touching or steadying assistance(Pt > 75%)      Upper Body Dressing/Undressing Upper body dressing   What is the patient wearing?: Bra, Pull over shirt/dress Bra - Perfomed by patient: Thread/unthread right bra strap, Thread/unthread left bra strap Bra - Perfomed by helper: Hook/unhook bra (pull down sports bra) Pull over shirt/dress - Perfomed by patient: Thread/unthread right sleeve, Thread/unthread left sleeve, Put head through opening, Pull shirt over trunk Pull over shirt/dress - Perfomed by helper: Thread/unthread right sleeve Button up shirt - Perfomed by patient: Thread/unthread right sleeve, Thread/unthread left sleeve Button up shirt - Perfomed by helper: Pull shirt around back (button n/a)    Upper body assist Assist Level: Supervision or verbal cues, Set up   Set up : To obtain clothing/put away  Lower Body Dressing/Undressing Lower body dressing   What is the patient wearing?: Underwear, Pants, Ted Hose, Non-skid slipper socks  Underwear - Performed by patient: Pull underwear up/down Underwear - Performed by helper: Thread/unthread right underwear leg, Thread/unthread left underwear leg Pants- Performed by patient: Pull pants up/down Pants- Performed by helper: Fasten/unfasten pants, Thread/unthread right pants leg,  Thread/unthread left pants leg Non-skid slipper socks- Performed by patient: Don/doff right sock, Don/doff left sock Non-skid slipper socks- Performed by helper: Don/doff right sock, Don/doff left sock   Socks - Performed by helper: Don/doff right sock, Don/doff left sock Shoes - Performed by patient: Fasten right, Fasten left Shoes - Performed by helper: Don/doff right shoe, Don/doff left shoe       TED Hose - Performed by helper: Don/doff left TED hose, Don/doff right TED hose  Lower body assist Assist for lower body dressing: Touching or steadying assistance (Pt > 75%)      Toileting Toileting   Toileting steps completed by patient: Performs perineal hygiene, Adjust clothing after toileting Toileting steps completed by helper: Adjust clothing prior to toileting Toileting Assistive Devices: Grab bar or rail  Toileting assist Assist level:  (Mod assist)   Transfers Chair/bed transfer   Chair/bed transfer method: Stand pivot Chair/bed transfer assist level: Touching or steadying assistance (Pt > 75%) Chair/bed transfer assistive device: Armrests, Medical sales representative     Max distance: 76ft Assist level: Touching or steadying assistance (Pt > 75%)   Wheelchair   Type: Manual Max wheelchair distance: 179ft Assist Level: Touching or steadying assistance (Pt > 75%)  Cognition Comprehension Comprehension assist level: Understands complex 90% of the time/cues 10% of the time  Expression Expression assist level: Expresses complex 90% of the time/cues < 10% of the time  Social Interaction Social Interaction assist level: Interacts appropriately 90% of the time - Needs monitoring or encouragement for participation or interaction.  Problem Solving Problem solving assist level: Solves basic 75 - 89% of the time/requires cueing 10 - 24% of the time  Memory Memory assist level: Recognizes or recalls 50 - 74% of the time/requires cueing 25 - 49% of the time    Medical  Problem List and Plan: 1. Left-sided hemiparesis with dysarthria secondary to right posterior limb and left occipital right thalamic CVA with history of CVA 2016 and right side residual weakness  -Continue CIR 2. DVT Prophylaxis/Anticoagulation: Subcutaneous Lovenox. Monitor platelet counts and any signs of bleeding 3. Pain Management: Tylenol as needed.   Controlled 4. Asthma. Continue chronic prednisone. Continue Dulera  Controlled 5. Neuropsych: This patient is capable of making decisions on her own behalf. 6. Skin/Wound Care: Routine skin checks 7. Fluids/Electrolytes/Nutrition: Routine I&O   Eating 50-100 % of her meals  BMP within acceptable range on 3/17 8. Hyperlipidemia. Pravachol 9. Legally blind right eye    LOS (Days) 10 A FACE TO FACE EVALUATION WAS PERFORMED  Gadiel John T 09/23/2015 10:24 AM

## 2015-09-23 NOTE — Progress Notes (Signed)
Physical Therapy Session Note  Patient Details  Name: Kjirsten Joffe MRN: [id number] Date of Birth: 1921-07-23  Today's Date: 09/23/2015 PT Individual Time: 0915-1000 PT Individual Time Calculation (min): 45 min   Short Term Goals: Week 1:  PT Short Term Goal 1 (Week 1): Pt will be able to ambulate 15 ft with LRAD & Mod A. PT Short Term Goal 2 (Week 1): Pt will complete squat pivot transfers with Min A & LRAD. PT Short Term Goal 3 (Week 1): Pt will perform sit<>stand transfers with Min A.  Skilled Therapeutic Interventions/Progress Updates:  Pt was seen bedside in the am. Pt c/o being tired but willing to participate with therapy. Pt transferred from recliner to w/c with rolling walker and min A with verbal cues. Pt propelled w/c about 50 feet with S required on straight away and min A for turns. Pt performed multiple sit to stand transfers with rolling walker and min A with verbal cues. Pt ambulated 25 feet x 4 with rolling walker and min A with verbal cues. Pt performed step taps 2 sets x 10 reps each. Pt returned to room. Pt transferred w/c to recliner with min A and rolling walker. Pt left sitting up in recliner witr quick release belt in place and call bell within reach.   Therapy Documentation Precautions:  Precautions Precautions: Fall Restrictions Weight Bearing Restrictions: No General:   Pain: Pain Assessment Pain Assessment: No/denies pain  See Function Navigator for Current Functional Status.   Therapy/Group: Individual Therapy  Dub Amis 09/23/2015, 12:15 PM

## 2015-09-23 NOTE — Progress Notes (Signed)
Patient attempted to get up off toilet with staff outside door. Able to verbalize need for safety precautions but is forgetful of them. Discussed with patient's son. Will report to oncoming RN and monitor.

## 2015-09-24 ENCOUNTER — Inpatient Hospital Stay (HOSPITAL_COMMUNITY): Payer: Medicare Other

## 2015-09-24 ENCOUNTER — Inpatient Hospital Stay (HOSPITAL_COMMUNITY): Payer: Medicare Other | Admitting: Speech Pathology

## 2015-09-24 ENCOUNTER — Inpatient Hospital Stay (HOSPITAL_COMMUNITY): Payer: Medicare Other | Admitting: Physical Therapy

## 2015-09-24 MED ORDER — BISACODYL 10 MG RE SUPP
10.0000 mg | Freq: Every day | RECTAL | Status: DC | PRN
  Filled 2015-09-24: qty 1

## 2015-09-24 MED ORDER — POLYETHYLENE GLYCOL 3350 17 G PO PACK
17.0000 g | PACK | Freq: Every day | ORAL | Status: DC
  Administered 2015-09-24: 17 g via ORAL
  Filled 2015-09-24 (×2): qty 1

## 2015-09-24 MED ORDER — ASPIRIN 81 MG PO CHEW
81.0000 mg | CHEWABLE_TABLET | Freq: Every day | ORAL | Status: DC
  Administered 2015-09-25: 81 mg via ORAL
  Filled 2015-09-24: qty 1

## 2015-09-24 NOTE — Plan of Care (Signed)
Problem: RH Ambulation Goal: LTG Patient will ambulate in home environment (PT) LTG: Patient will ambulate in home environment, # of feet with assistance (PT).  Outcome: Not Met (add Reason) 8 ft with RW, poor balance  Problem: RH Wheelchair Mobility Goal: LTG Patient will propel w/c in home environment (PT) LTG: Patient will propel wheelchair in home environment, # of feet with assistance (PT).  Outcome: Not Met (add Reason) Pt only propelled in controlled environment

## 2015-09-24 NOTE — Progress Notes (Signed)
[place] PHYSICAL MEDICINE & REHABILITATION     PROGRESS NOTE  Subjective/Complaints:  Patient lying in bed this morning. States she had a good weekend.  ROS: Denies CP, SOB, N/V/D  Objective: Vital Signs: Blood pressure 129/59, pulse 68, temperature 98 F (36.7 C), temperature source Oral, resp. rate 18, height 5\' 4"  (1.626 m), weight 73.029 kg (161 lb), SpO2 97 %. No results found. No results for input(s): WBC, HGB, HCT, PLT in the last 72 hours. No results for input(s): NA, K, CL, GLUCOSE, BUN, CREATININE, CALCIUM in the last 72 hours.  Invalid input(s): CO CBG (last 3)  No results for input(s): GLUCAP in the last 72 hours.  Wt Readings from Last 3 Encounters:  09/13/15 73.029 kg (161 lb)  08/02/15 72.576 kg (160 lb)  06/27/15 73.71 kg (162 lb 8 oz)    Physical Exam:  BP 129/59 mmHg  Pulse 68  Temp(Src) 98 F (36.7 C) (Oral)  Resp 18  Ht 5\' 4"  (1.626 m)  Wt 73.029 kg (161 lb)  BMI 27.62 kg/m2  SpO2 97% Constitutional: She appears well-developed and well-nourished. NAD. Vital signs reviewed.  HENT: Normocephalic and atraumatic.  Eyes: Conjunctivae and EOM are normal. No scleral icterus.   Cardiovascular: Normal rate and regular rhythm.  Respiratory: Effort normal and breath sounds normal. No respiratory distress.  GI: Soft. Bowel sounds are normal. She exhibits no distension.  Musculoskeletal: She exhibits no edema or tenderness.  Neurological:  She is alert and oriented x3 with cues Follows simple commands. Motor: LUE/LLE: 5/5 RUE: 4+5 shoulder abduction, elbow flexion/extensionm, 4+/5 hand grip. Ataxia. RLE: 4/5 hip flexion, 4/5 knee extension, 4+/5 ankle dorsi/plantarflexion  Skin: Skin is warm and dry.  Psychiatric: She has a normal mood and affect. Her behavior is normal. Thought content normal  Assessment/Plan: 1. Functional deficits secondary to right posterior limb and left occipital right thalamic CVA with history of CVA 2016 and right side  residual weakness which require 3+ hours per day of interdisciplinary therapy in a comprehensive inpatient rehab setting. Physiatrist is providing close team supervision and 24 hour management of active medical problems listed below. Physiatrist and rehab team continue to assess barriers to discharge/monitor patient progress toward functional and medical goals.  Function:  Bathing Bathing position   Position: Shower  Bathing parts Body parts bathed by patient: Left arm, Right arm, Chest, Abdomen, Front perineal area, Right upper leg, Left upper leg Body parts bathed by helper: Buttocks, Right lower leg, Left lower leg, Back  Bathing assist Assist Level: Touching or steadying assistance(Pt > 75%)      Upper Body Dressing/Undressing Upper body dressing   What is the patient wearing?: Bra, Pull over shirt/dress Bra - Perfomed by patient: Thread/unthread right bra strap, Thread/unthread left bra strap Bra - Perfomed by helper: Hook/unhook bra (pull down sports bra) Pull over shirt/dress - Perfomed by patient: Thread/unthread right sleeve, Thread/unthread left sleeve, Put head through opening, Pull shirt over trunk Pull over shirt/dress - Perfomed by helper: Thread/unthread right sleeve Button up shirt - Perfomed by patient: Thread/unthread right sleeve, Thread/unthread left sleeve Button up shirt - Perfomed by helper: Pull shirt around back (button n/a)    Upper body assist Assist Level: Supervision or verbal cues, Set up   Set up : To obtain clothing/put away  Lower Body Dressing/Undressing Lower body dressing   What is the patient wearing?: Underwear, Pants, Ted Hose, Non-skid slipper socks Underwear - Performed by patient: Pull underwear up/down Underwear - Performed by helper: Thread/unthread  right underwear leg, Thread/unthread left underwear leg Pants- Performed by patient: Pull pants up/down Pants- Performed by helper: Fasten/unfasten pants, Thread/unthread right pants leg,  Thread/unthread left pants leg Non-skid slipper socks- Performed by patient: Don/doff right sock, Don/doff left sock Non-skid slipper socks- Performed by helper: Don/doff right sock, Don/doff left sock   Socks - Performed by helper: Don/doff right sock, Don/doff left sock Shoes - Performed by patient: Fasten right, Fasten left Shoes - Performed by helper: Don/doff right shoe, Don/doff left shoe       TED Hose - Performed by helper: Don/doff left TED hose, Don/doff right TED hose  Lower body assist Assist for lower body dressing: Touching or steadying assistance (Pt > 75%)      Toileting Toileting   Toileting steps completed by patient: Adjust clothing prior to toileting, Performs perineal hygiene Toileting steps completed by helper: Adjust clothing prior to toileting, Adjust clothing after toileting Toileting Assistive Devices: Grab bar or rail  Toileting assist Assist level:  (Mod assist)   Transfers Chair/bed transfer   Chair/bed transfer method: Stand pivot Chair/bed transfer assist level: Touching or steadying assistance (Pt > 75%) Chair/bed transfer assistive device: Armrests, Medical sales representative     Max distance: 25 Assist level: Touching or steadying assistance (Pt > 75%)   Wheelchair   Type: Manual Max wheelchair distance: 50 Assist Level: Supervision or verbal cues, Touching or steadying assistance (Pt > 75%)  Cognition Comprehension Comprehension assist level: Understands complex 90% of the time/cues 10% of the time  Expression Expression assist level: Expresses complex 90% of the time/cues < 10% of the time  Social Interaction Social Interaction assist level: Interacts appropriately 90% of the time - Needs monitoring or encouragement for participation or interaction.  Problem Solving Problem solving assist level: Solves basic 75 - 89% of the time/requires cueing 10 - 24% of the time  Memory Memory assist level: Recognizes or recalls 50 - 74% of the  time/requires cueing 25 - 49% of the time    Medical Problem List and Plan: 1. Left-sided hemiparesis with dysarthria secondary to right posterior limb and left occipital right thalamic CVA with history of CVA 2016 and right side residual weakness  -Continue CIR 2. DVT Prophylaxis/Anticoagulation: Subcutaneous Lovenox. Monitor platelet counts and any signs of bleeding 3. Pain Management: Tylenol as needed.   No medications needed at this time 4. Asthma. Continue chronic prednisone. Continue Dulera  Controlled for the most part 5. Neuropsych: This patient is capable of making decisions on her own behalf. 6. Skin/Wound Care: Routine skin checks 7. Fluids/Electrolytes/Nutrition: Routine I&O   Eating 50-100 % of her meals  BMP within acceptable range on 3/17 8. Hyperlipidemia. Pravachol 9. Legally blind right eye  LOS (Days) 11 A FACE TO FACE EVALUATION WAS PERFORMED  Ladaja Yusupov Lorie Phenix 09/24/2015 9:11 AM

## 2015-09-24 NOTE — Discharge Summary (Signed)
Discharge summary job # [id number]

## 2015-09-24 NOTE — Progress Notes (Signed)
Social Work Patient ID: Darlene Murray, female   DOB: [birth year], 82 y.o.   MRN: [id number] Met with pt and husband to discuss going to a NH for more therapies prior to going home. She reports: " They are all bombarding me, I guess I don't have a choice." She has agreed to go to [facility], they have offered a bed for tomorrow. Husband aware facility will contact him tomorrow to complete paperwork and transfer over there tomorrow. Made team and PA aware also.

## 2015-09-24 NOTE — Progress Notes (Signed)
Speech Language Pathology Discharge Summary  Patient Details  Name: Darlene Murray MRN: [id number] Date of Birth: 04/10/1922  Today's Date: 09/24/2015 SLP Individual Time: 1035-1120 SLP Individual Time Calculation (min): 45 min  Skilled Therapeutic Interventions:   Skilled treatment session focused on addressing cognition goals. SLP facilitated session by providing Min assist question cues to increase self-monitoring and correcting for accuracy during a familiar problem solving task.  Patient demonstrated decreased mental flexibility with calendar/scheduling task, but good awareness of items that were not appropriate for her to do.  SLP also completed patient and family education with son regarding memory compensatory strategies to utilize for carryover and home management.     Patient has met 2 of 2 long term goals.  Patient to discharge at overall Min level.  Reasons goals not met: n/a   Clinical Impression/Discharge Summary:    Patient has made functional gains during this rehab admission and has met 2 out of 2 long term goals due to improved functional abilities.  Patient is currently an overall Min assist for basic and familiar cognitive tasks.  Patient and family education has been completed and patient will discharge home with 24 hour supervision. Patient would benefit from follow up SLP services to continue efforts to maximize independence and cognitive skills and to further reduce the burden of care.   Care Partner:  Caregiver Able to Provide Assistance: Yes  Type of Caregiver Assistance: Cognitive;Physical  Recommendation:  Home Health SLP;24 hour supervision/assistance  Rationale for SLP Follow Up: Maximize cognitive function and independence;Reduce caregiver burden   Equipment: none   Reasons for discharge: Treatment goals met;Discharged from hospital   Patient/Family Agrees with Progress Made and Goals Achieved: Yes   Function:  Cognition Comprehension Comprehension  assist level: Follows basic conversation/direction with no assist  Expression   Expression assist level: Expresses basic needs/ideas: With no assist  Social Interaction Social Interaction assist level: Interacts appropriately 90% of the time - Needs monitoring or encouragement for participation or interaction.  Problem Solving Problem solving assist level: Solves basic 75 - 89% of the time/requires cueing 10 - 24% of the time  Memory Memory assist level: Recognizes or recalls 50 - 74% of the time/requires cueing 25 - 49% of the time    , M.A., CCC-SLP [phone]  , 09/24/2015, 11:47 AM    

## 2015-09-24 NOTE — Discharge Instructions (Signed)
Inpatient Rehab Discharge Instructions  [place] Discharge date and time: No discharge date for patient encounter.   Activities/Precautions/ Functional Status: Activity: activity as tolerated Diet: regular diet Wound Care: none needed Functional status:  ___ No restrictions     ___ Walk up steps independently ___ 24/7 supervision/assistance   ___ Walk up steps with assistance ___ Intermittent supervision/assistance  ___ Bathe/dress independently ___ Walk with walker     _x STROKE/TIA DISCHARGE INSTRUCTIONS SMOKING Cigarette smoking nearly doubles your risk of having a stroke & is the single most alterable risk factor  If you smoke or have smoked in the last 12 months, you are advised to quit smoking for your health.  Most of the excess cardiovascular risk related to smoking disappears within a year of stopping.  Ask you doctor about anti-smoking medications  [place] Quit Line: 1-800-QUIT NOW  Free Smoking Cessation Classes [phone]  CHOLESTEROL Know your levels; limit fat & cholesterol in your diet  Lipid Panel     Component Value Date/Time   CHOL 113 09/12/2015 0340   TRIG 48 09/12/2015 0340   HDL 39* 09/12/2015 0340   CHOLHDL 2.9 09/12/2015 0340   VLDL 10 09/12/2015 0340   LDLCALC 64 09/12/2015 0340      Many patients benefit from treatment even if their cholesterol is at goal.  Goal: Total Cholesterol (CHOL) less than 160  Goal:  Triglycerides (TRIG) less than 150  Goal:  HDL greater than 40  Goal:  LDL (LDLCALC) less than 100   BLOOD PRESSURE American Stroke Association blood pressure target is less that 120/80 mm/Hg  Your discharge blood pressure is:  BP: (!) 111/51 mmHg  Monitor your blood pressure  Limit your salt and alcohol intake  Many individuals will require more than one medication for high blood pressure  DIABETES (A1c is a blood sugar average for last 3 months) Goal HGBA1c is under 7% (HBGA1c is blood sugar average for last 3 months)    Diabetes: No known diagnosis of diabetes    Lab Results  Component Value Date   HGBA1C 5.7* 09/12/2015     Your HGBA1c can be lowered with medications, healthy diet, and exercise.  Check your blood sugar as directed by your physician  Call your physician if you experience unexplained or low blood sugars.  PHYSICAL ACTIVITY/REHABILITATION Goal is 30 minutes at least 4 days per week  Activity: Increase activity slowly, Therapies: Physical Therapy: Home Health Return to work:   Activity decreases your risk of heart attack and stroke and makes your heart stronger.  It helps control your weight and blood pressure; helps you relax and can improve your mood.  Participate in a regular exercise program.  Talk with your doctor about the best form of exercise for you (dancing, walking, swimming, cycling).  DIET/WEIGHT Goal is to maintain a healthy weight  Your discharge diet is: Diet regular Room service appropriate?: Yes; Fluid consistency:: Thin  liquids Your height is:  Height: 5\' 4"  (162.6 cm) Your current weight is: Weight: 73.029 kg (161 lb) Your Body Mass Index (BMI) is:  BMI (Calculated): 27.7  Following the type of diet specifically designed for you will help prevent another stroke.  Your goal weight range is:    Your goal Body Mass Index (BMI) is 19-24.  Healthy food habits can help reduce 3 risk factors for stroke:  High cholesterol, hypertension, and excess weight.  RESOURCES Stroke/Support Group:  Call [facility] [place]  Stroke warning signs and symptoms How to activate emergency medical system (call 911). Medications prescribed at discharge. Need for follow-up after discharge. Personal risk factors for stroke. Pneumonia vaccine given:  Flu vaccine given:  My questions have been answered, the writing is legible, and I understand these instructions.  I will adhere to these goals & educational materials that have been  provided to me after my discharge from the hospital.    __ Bathe/dress with assistance ___ Walk Independently    ___ Shower independently ___ Walk with assistance    ___ Shower with assistance ___ No alcohol     ___ Return to work/school ________  Special Instructions:    My questions have been answered and I understand these instructions. I will adhere to these goals and the provided educational materials after my discharge from the hospital.  Patient/Caregiver Signature _______________________________ Date __________  Clinician Signature _______________________________________ Date __________  Please bring this form and your medication list with you to all your follow-up doctor's appointments.

## 2015-09-24 NOTE — Plan of Care (Signed)
Problem: RH Ambulation Goal: LTG Patient will ambulate in home environment (PT) LTG: Patient will ambulate in home environment, # of feet with assistance (PT).  Outcome: Not Met (add Reason) With RW, 2/2 decreased balance  Problem: RH Wheelchair Mobility Goal: LTG Patient will propel w/c in home environment (PT) LTG: Patient will propel wheelchair in home environment, # of feet with assistance (PT).  Outcome: Not Met (add Reason) Pt only propelled w/c in controlled environment  Problem: RH Stairs Goal: LTG Patient will ambulate up and down stairs w/assist (PT) LTG: Patient will ambulate up and down # of stairs with assistance (PT)  Outcome: Completed/Met Date Met:  09/24/15 4 steps with B rails  Problem: RH Balance Goal: LTG Patient will maintain dynamic standing balance (PT) LTG: Patient will maintain dynamic standing balance with assistance during mobility activities (PT)  Outcome: Not Met (add Reason) Decreased balance  Problem: RH Ambulation Goal: LTG Patient will ambulate in controlled environment (PT) LTG: Patient will ambulate in a controlled environment, # of feet with assistance (PT).  Outcome: Not Met (add Reason) With RW, 2/2 fatigue

## 2015-09-24 NOTE — NC FL2 (Signed)
[place] LEVEL OF CARE SCREENING TOOL     IDENTIFICATION  Patient Name: Darlene Murray Birthdate: [birth year] Sex: female Admission Date (Current Location): 09/13/2015  [facility] and [place] Number:  Herbalist and Address:  [facility], [place] [address], [place], [place] [address]      Provider Number: [id number]  Attending Physician Name and Address:  Ankit Lorie Phenix, MD  Relative Name and Phone Number:  Jenny Reichmann [phone]    Current Level of Care: Other (Comment) (rehab) Recommended Level of Care: Nursing Facility Prior Approval Number:    Date Approved/Denied:   PASRR Number: [id number]  Discharge Plan: SNF    Current Diagnoses: Patient Active Problem List   Diagnosis Date Noted  . Decreased GFR   . Asthma, severe persistent   . Thalamic infarct, acute ([place]) 09/13/2015  . Acute left hemiparesis ([place])   . Right-sided lacunar infarction ([place])   . History of CVA with residual deficit   . Asthma, moderate persistent   . Left-sided weakness 09/11/2015  . Cerebral infarction due to unspecified mechanism   . Acromioclavicular joint arthritis 08/20/2015  . Acute prerenal azotemia 08/02/2015  . CKD (chronic kidney disease), stage III 08/02/2015  . Memory loss 06/15/2015  . Acute right-sided weakness 06/14/2015  . Spondylosis, cervical, with myelopathy 06/13/2015  . Ataxia S/P CVA 05/17/2015  . Gait disturbance, post-stroke 05/17/2015  . Sepsis ([place]) 04/23/2015  . Urinary tract infection 04/23/2015  . Acute hemorrhagic infarction of brain ([place]) 04/10/2015  . Ataxia   . History of mixed hemorraghic embolic stroke [id number]  . Weakness 04/06/2015  . Generalized weakness 04/06/2015  . Nausea with vomiting 04/06/2015  . Left ventricular diastolic dysfunction with preserved systolic function ([place]) [id number]  . Severe persistent asthma in adult steroid dependent   . Allergic rhinitis   . Hyperlipidemia   . Venous  insufficiency   . HNP (herniated nucleus pulposus), lumbar   . Left ventricular hypertrophy     Orientation RESPIRATION BLADDER Height & Weight     Self, Situation, Place  Normal Continent Weight: 161 lb (73.029 kg) Height:  5\' 4"  (162.6 cm)  BEHAVIORAL SYMPTOMS/MOOD NEUROLOGICAL BOWEL NUTRITION STATUS      Continent Diet (regular diet)  AMBULATORY STATUS COMMUNICATION OF NEEDS Skin   Limited Assist Verbally Normal                       Personal Care Assistance Level of Assistance  Dressing, Bathing Bathing Assistance: Limited assistance   Dressing Assistance: Limited assistance     Functional Limitations Info  Sight Sight Info: Impaired        SPECIAL CARE FACTORS FREQUENCY  PT (By licensed PT), OT (By licensed OT), Speech therapy     PT Frequency: 5 x week OT Frequency: 5 x week     Speech Therapy Frequency: 5 x week      Contractures Contractures Info: Not present    Additional Factors Info                  Current Medications (09/24/2015):  This is the current hospital active medication list Current Facility-Administered Medications  Medication Dose Route Frequency Provider Last Rate Last Dose  . acetaminophen (TYLENOL) tablet 650 mg  650 mg Oral Q6H PRN Lavon Paganini Angiulli, PA-C       Or  . acetaminophen (TYLENOL) suppository 650 mg  650 mg Rectal Q6H PRN Lavon Paganini Angiulli, PA-C      .  albuterol (PROVENTIL) (2.5 MG/3ML) 0.083% nebulizer solution 3 mL  3 mL Inhalation Q6H PRN Bary Leriche, PA-C   3 mL at 09/21/15 1850  . aspirin chewable tablet 81 mg  81 mg Oral Daily Lavon Paganini Angiulli, PA-C   81 mg at 09/24/15 1142  . bisacodyl (DULCOLAX) suppository 10 mg  10 mg Rectal Daily PRN Lavon Paganini Angiulli, PA-C      . clopidogrel (PLAVIX) tablet 75 mg  75 mg Oral Daily Lavon Paganini Angiulli, PA-C   75 mg at 09/24/15 0716  . enoxaparin (LOVENOX) injection 40 mg  40 mg Subcutaneous Daily Lavon Paganini Angiulli, PA-C   40 mg at 09/24/15 0716  . mometasone-formoterol  (DULERA) 100-5 MCG/ACT inhaler 2 puff  2 puff Inhalation BID Lavon Paganini Angiulli, PA-C   2 puff at 09/24/15 0801  . ondansetron (ZOFRAN) tablet 4 mg  4 mg Oral Q6H PRN Lavon Paganini Angiulli, PA-C       Or  . ondansetron (ZOFRAN) injection 4 mg  4 mg Intravenous Q6H PRN Daniel J Angiulli, PA-C      . polyethylene glycol (MIRALAX / GLYCOLAX) packet 17 g  17 g Oral Daily Lavon Paganini Angiulli, PA-C   17 g at 09/24/15 1138  . pravastatin (PRAVACHOL) tablet 20 mg  20 mg Oral q1800 Lavon Paganini Angiulli, PA-C   20 mg at 09/23/15 1835  . predniSONE (DELTASONE) tablet 5 mg  5 mg Oral Daily Lavon Paganini Angiulli, PA-C   5 mg at 09/24/15 0716  . sorbitol 70 % solution 30 mL  30 mL Oral Daily PRN Lavon Paganini Angiulli, PA-C   30 mL at 09/23/15 1218     Discharge Medications: Please see discharge summary for a list of discharge medications.  Relevant Imaging Results:  Relevant Lab Results:   Additional Information    Dajon Lazar, [name], LCSW

## 2015-09-24 NOTE — Progress Notes (Signed)
Social Work Patient ID: Darlene Murray, female   DOB: 07/05/1922, 82 y.o.   MRN: [id number] [place] with husband who feels pt needs to go to a NH to continue to get more therapies before going home. He plans to come up and discuss this with her today. He is aware she will need to agree to this plan. Will see husband when gets here.

## 2015-09-24 NOTE — Progress Notes (Signed)
Speech Language Pathology Daily Session Note  Patient Details  Name: Darlene Murray MRN: [id number] Date of Birth: May 07, 1922  Today's Date: 09/24/2015 SLP Individual Time: [id number] SLP Individual Time Calculation (min): 30 min  Short Term Goals: Week 1: SLP Short Term Goal 1 (Week 1): STG=LTG due to ELOS   Skilled Therapeutic Interventions: Pt seen after discussion regarding SNF placement with SW. Pt therapy time used to get on the internet so pt could preview her next place for rehab services. Prior to the stroke, per family pt used the internet independently for shopping and emailing. Pt required max A for navigation of the computer and searching the internet. Pt was able to demonstrate ability to read pertinent information on the website with verbal and visual cueing for initiation. Pt's spouse present for session.   Function:    Cognition Comprehension Comprehension assist level: Understands basic 90% of the time/cues < 10% of the time  Expression   Expression assist level: Expresses basic 90% of the time/requires cueing < 10% of the time.  Social Interaction Social Interaction assist level: Interacts appropriately 90% of the time - Needs monitoring or encouragement for participation or interaction.  Problem Solving Problem solving assist level: Solves basic 75 - 89% of the time/requires cueing 10 - 24% of the time  Memory Memory assist level: Recognizes or recalls 25 - 49% of the time/requires cueing 50 - 75% of the time    Pain Pain Assessment Pain Assessment: No/denies pain  Therapy/Group: Individual Therapy  Vinetta Bergamo 09/24/2015, 2:54 PM

## 2015-09-24 NOTE — Discharge Summary (Signed)
NAME:  Darlene Murray, Darlene Murray NO.:  [id number]  MEDICAL RECORD NO.:  [id number]  LOCATION:  4W10C                        FACILITY:  [facility]  PHYSICIAN:  Delice Lesch, MD        DATE OF BIRTH:  [birth year]  DATE OF ADMISSION:  09/13/2015 DATE OF DISCHARGE:  09/25/2015                              DISCHARGE SUMMARY   DISCHARGE DIAGNOSES: 1. Right posterior limb and left occipital right thalamic     cerebrovascular accident with history of cerebrovascular accident     in 2016 and right-sided residual weakness. 2. Subcutaneous Lovenox for deep vein thrombosis prophylaxis. 3. Asthma. 4. Hyperlipidemia. 5. Legally blind, right eye.  HISTORY OF PRESENT ILLNESS:  This is a 82 year old right-handed female with history of asthma, maintained on prednisone; CVA; right-sided residual weakness, received inpatient rehab services in October 2016. Lives with her husband.  Ambulated short distances with a walker prior to admission.  Receiving home health therapies.  Presented on September 11, 2015, with left-sided weakness, slurred speech.  MRI of the brain showed acute nonhemorrhagic infarct, right thalamus and posterior limb, right internal capsule.  Tiny acute medial left occipital lobe infarct. Remote bilateral cerebellar infarcts.  MRA of the head and neck showed moderate to marked narrowing proximal right vertebral artery. Echocardiogram with ejection fraction of [id number], grade 1 diastolic dysfunction.  The patient did not receive tPA.  Maintained on aspirin, Plavix therapy.  Subcutaneous Lovenox for DVT prophylaxis.  The patient was admitted for a comprehensive rehab program.  PAST MEDICAL HISTORY:  See discharge diagnoses.  SOCIAL HISTORY:  Lives with spouse, independent with assistive device prior to admission.  Functional status upon admission to rehab services was min to mod assist, min to mod assist sit to stand as well as ambulation.  PHYSICAL EXAMINATION:  VITAL SIGNS:  Blood  pressure 128/70, pulse 80, temperature 97, respirations 18. GENERAL:  This was an alert female, oriented to person, place, and time. Good awareness of deficits.  She could not recall her latest rehabilitation stay. LUNGS:  Clear to auscultation without wheeze. CARDIAC:  Regular rate and rhythm.  No murmur. ABDOMEN:  Soft, nontender.  Good bowel sounds.  REHABILITATION HOSPITAL COURSE:  The patient was admitted to inpatient rehab services with therapies initiated on a 3-hour daily basis consisting of physical therapy, occupational therapy, and rehabilitation nursing.  The following issues were addressed during the patient's rehabilitation stay.  Pertaining to Ms. Aston's right posterior limb left occipital thalamic CVA remained stable, maintained on aspirin and Plavix therapy.  She would follow up with Neurology Services.  She did have a history of asthma, she remained on chronic prednisone.  Blood pressure is well controlled.  Her appetite continued to improve on a regular consistency diet.  She remained on Pravachol for history of hyperlipidemia.  She was legally blind in the right eye.  The patient received weekly collaborative interdisciplinary team conferences to discuss estimated length of stay, family teaching, and any barriers to discharge.  Transferred back from recliner to wheelchair with rolling walker, minimal assist.  Propelled her wheelchair, supervision. Performed multiple sit to stand transfers, minimal assistance. Ambulated 25 feet x4 with rolling walker.  She  could gather her belongings for activities of daily living and homemaking.  Performed stand pivot transfers, moderate assist.  Engaged in sit to stand therapies. Her husband did not feel he could provide the necessary assistance at home and fell skilled nursing facility was needed with bed becoming available [address] 2017 Discharge Taking place  DISCHARGE MEDICATIONS:  Included: 1. Aspirin 81 mg p.o. daily. 2. Plavix  75 mg p.o. daily. 3. Dulera 100-5 mcg 2 puffs twice daily. 4. Pravachol 20 mg p.o. daily. 5. Prednisone 5 mg p.o. daily.  DIET:  Regular.  FOLLOWUP:  She would follow up with Dr. Posey Pronto at the outpatient rehab center as directed; Dr. Antony Contras, Neurology Service, in 1 month, call for appointment; Dr. Hulan Fess, Medical Management on October 04, 2015.  Ongoing therapies dictated per [employer].     Lauraine Rinne, P.A.   ______________________________ Delice Lesch, MD    DA/MEDQ  D:  09/24/2015  T:  09/24/2015  Job:  [id number]  cc:   Pramod P. Leonie Man, MD Priscille Heidelberg Little, M.D.

## 2015-09-24 NOTE — Progress Notes (Signed)
Social Work  Discharge Note  The overall goal for the admission was met for:   Discharge location: [facility]  Length of Stay: Yes-12 DAYS  Discharge activity level: Yes-MIN ASSIST LEVEL  Home/community participation: Yes  Services provided included: MD, RD, PT, OT, SLP, RN, CM, TR, Pharmacy and SW  Financial Services: Medicare and Private Insurance: [facility]  Follow-up services arranged: Home Health: [facility]-PT,OT,RN and Patient/Family request agency HH: [facility], DME: HAS ALL NEEDED EQUIPMENT  HUSBAND AND HER CHILDREN FELT PT NEEDED TO GO TO A NH TO GET [place]. PT RELUCTANTLY AGREED TO THIS OPTION.  Patient/Family verbalized understanding of follow-up arrangements: Yes  Individual responsible for coordination of the follow-up plan: JOHN-HUSBAND  Confirmed correct DME delivered: [name] 09/24/2015    [name]

## 2015-09-24 NOTE — Progress Notes (Signed)
Occupational Therapy Session Note  Patient Details  Name: Tineka Perrell MRN: [id number] Date of Birth: 21-Apr-1922  Today's Date: 09/24/2015 OT Individual Time:1300-1400 61 Minutes  Short Term Goals: Week 2:  OT Short Term Goal 1 (Week 2): STG=LTG d/t short remaining LOS  Skilled Therapeutic Interventions/Progress Updates: Therapeutic activities with focus on transfers, static and dynamic standing balance, toileting, lower body dressing, and car transfer.   Pt received dressed with husband and son present.   OT orients pt and family to intent of session in preparation for transfer to SNF for continued rehab with pt directed to perform lower body dressing, transfers, and dynamic standing tasks as evidence for need for continued rehab.   With extra time, pt removes shoes, unfastening velcro closures by slowly reaching to her  feet while seated in w/c   Pt rests and proceeds to remove pants as directed, with extra time and OT assist to steady RW as she stands supported and lowers pants to level of her knees before returning to sitting.   OT detects order and advised toilet transfer which pt performed with steadying assist after setup to place w/c at toilet.   Pt removes underwear and performs hygiene with setup to provide supplies and extra time.   Pt able to don new underwear and pants and then  stand supported at RW as OT pulls up underwear and pants.   Pt is escorted to sink to groom and then is escorted to ortho gym to perform transfer to car from w/c and back with overall min assist to lift and lower left leg.   Pt is returned to her room with husband present to remain in w/c at end of session with all needs placed within reach.     Therapy Documentation Precautions:  Precautions Precautions: Fall Restrictions Weight Bearing Restrictions: No   Vital Signs: Therapy Vitals Temp: 98 F (36.7 C) Temp Source: Oral Pulse Rate: 67 Resp: 18 BP: (!) 129/59 mmHg Patient Position (if appropriate):  Lying Oxygen Therapy SpO2: 96 % O2 Device: Not Delivered   Pain: Pain Assessment Pain Assessment: No/denies pain   ADL: ADL ADL Comments: see Functional Assessment Tool  See Function Navigator for Current Functional Status.   Therapy/Group: Individual Therapy  Verlon Carcione 09/24/2015, 7:23 AM

## 2015-09-24 NOTE — Progress Notes (Signed)
Physical Therapy Discharge Summary  Patient Details  Name: Darlene Murray MRN: [id number] Date of Birth: [birth year]  Today's Date: 09/24/2015 PT Individual Time: 0801-0902 PT Individual Time Calculation (min): 61 min    Patient has met 7 of 11 long term goals due to improved activity tolerance, improved balance, improved postural control and increased strength.  Patient to discharge at a wheelchair level Supervision.   Patient's care partner is independent to provide the necessary physical assistance at discharge.  Reasons goals not met: Pt continues to require frequent cuing for safe w/c propulsion in open & controlled environment.  Pt also fatigues quickly and is unable to ambulate more than 40 ft at this time with RW & Min A.  Recommendation:  Patient will benefit from ongoing skilled PT services in skilled nursing facility setting to continue to advance safe functional mobility, address ongoing impairments in decreased standing balance, poor activity tolerance, decreased postural control & to reduce L lateral lean, decreased safety awareness with all functional mobility, decreased safety awareness with w/c propulsion, and minimize fall risk.  Equipment: No equipment provided  Reasons for discharge: treatment goals met  Patient/family agrees with progress made and goals achieved: Yes  Skilled PT treatment: Pt received sitting on EOB & agreeable to PT. PT assisted pt with dressing total A for time management. Pt demonstrated mod I with bed mobility, except requiring supervision and verbal cuing for technique to roll L without bed features. Pt able to demonstrate stand pivot transfers with supervision A, only requiring set up for task. Pt able to propel w/c with BUE & supervision for 150 ft with encouragement from PT & occasional rest breaks 2/2 fatigue; pt propelled w/c at significantly decreased pace. Pt able to negotiate 4 steps with B railings & min A for BLE strengthening. Pt also able to  ambulate 40 ft with RW & Min A. During gait pt demonstrated significant L lateral lean requiring verbal & tactile cues for correction with pt resisting movement. PT encouraged pt to ambulate further distance but pt reporting need for sitting break. Pt transported back to room via w/c total A & performed stand>pivot w/c>bed with supervision. At end of session pt left in bed with all needs within reach.  PT Discharge Pain Pain Assessment Pain Assessment: No/denies pain  Cognition Overall Cognitive Status: Impaired/Different from baseline (unsure of pt's baseline)  Mobility Bed Mobility Bed Mobility: Rolling Right;Rolling Left;Supine to Sit;Sit to Supine Rolling Right: 6: Modified independent (Device/Increase time) Rolling Left: 5: Supervision Rolling Left Details: Verbal cues for sequencing;Verbal cues for technique Supine to Sit: 6: Modified independent (Device/Increase time) Sit to Supine: 6: Modified independent (Device/Increase time) Transfers Transfers: Yes Sit to Stand: 5: Supervision Sit to Stand Details: Verbal cues for technique Stand Pivot Transfers: 5: Supervision Locomotion  Ambulation Ambulation: Yes Ambulation/Gait Assistance: 4: Min assist;4: Min guard Ambulation Distance (Feet): 40 Feet Assistive device: Rolling walker Ambulation/Gait Assistance Details: Tactile cues for weight shifting;Verbal cues for gait pattern;Verbal cues for sequencing;Verbal cues for technique Gait Gait: Yes Gait Pattern: Decreased weight shift to right;Shuffle;Decreased step length - right;Decreased step length - left (L lateral lean) Stairs / Additional Locomotion Stairs: Yes Stairs Assistance: 4: Min assist Stairs Assistance Details: Verbal cues for sequencing;Verbal cues for technique Stair Management Technique: Two rails Number of Stairs: 4 Height of Stairs: 6 Wheelchair Mobility Wheelchair Mobility: Yes Wheelchair Assistance: 5: Investment banker, operational Details: Verbal  cues for Astronomer: Both upper extremities Wheelchair Parts Management: Needs assistance Distance: 150 ft  Balance Dynamic Sitting Balance Dynamic Sitting - Balance Support: No upper extremity supported Dynamic Sitting - Level of Assistance: 6: Modified independent (Device/Increase time) Static Standing Balance Static Standing - Balance Support: Left upper extremity supported Static Standing - Level of Assistance: 4: Min assist Extremity Assessment      RLE Assessment RLE Assessment: Exceptions to [facility] RLE AROM (degrees) Overall AROM Right Lower Extremity: Within functional limits for tasks assessed RLE Strength Right Hip Flexion: 3-/5 Right Knee Flexion: 4-/5 Right Knee Extension: 3+/5 Right Ankle Dorsiflexion: 4-/5 LLE Assessment LLE Assessment: Exceptions to [facility] LLE Strength Left Hip Flexion: 3+/5 Left Knee Flexion: 4-/5 Left Knee Extension: 4-/5 Left Ankle Dorsiflexion: 3+/5   See Function Navigator for Current Functional Status.  Waunita Schooner 09/24/2015, 6:08 PM

## 2015-09-25 DIAGNOSIS — J4550 Severe persistent asthma, uncomplicated: Secondary | ICD-10-CM | POA: Diagnosis not present

## 2015-09-25 DIAGNOSIS — R2681 Unsteadiness on feet: Secondary | ICD-10-CM | POA: Diagnosis not present

## 2015-09-25 DIAGNOSIS — Z79899 Other long term (current) drug therapy: Secondary | ICD-10-CM | POA: Diagnosis not present

## 2015-09-25 DIAGNOSIS — E46 Unspecified protein-calorie malnutrition: Secondary | ICD-10-CM | POA: Diagnosis not present

## 2015-09-25 DIAGNOSIS — H548 Legal blindness, as defined in USA: Secondary | ICD-10-CM | POA: Diagnosis not present

## 2015-09-25 DIAGNOSIS — N390 Urinary tract infection, site not specified: Secondary | ICD-10-CM | POA: Diagnosis not present

## 2015-09-25 DIAGNOSIS — E785 Hyperlipidemia, unspecified: Secondary | ICD-10-CM | POA: Diagnosis not present

## 2015-09-25 DIAGNOSIS — R269 Unspecified abnormalities of gait and mobility: Secondary | ICD-10-CM | POA: Diagnosis not present

## 2015-09-25 DIAGNOSIS — J45909 Unspecified asthma, uncomplicated: Secondary | ICD-10-CM | POA: Diagnosis not present

## 2015-09-25 DIAGNOSIS — H5440 Blindness, one eye, unspecified eye: Secondary | ICD-10-CM | POA: Diagnosis not present

## 2015-09-25 DIAGNOSIS — E43 Unspecified severe protein-calorie malnutrition: Secondary | ICD-10-CM | POA: Diagnosis not present

## 2015-09-25 DIAGNOSIS — H5441 Blindness, right eye, normal vision left eye: Secondary | ICD-10-CM | POA: Diagnosis not present

## 2015-09-25 DIAGNOSIS — M7501 Adhesive capsulitis of right shoulder: Secondary | ICD-10-CM | POA: Diagnosis not present

## 2015-09-25 DIAGNOSIS — G8194 Hemiplegia, unspecified affecting left nondominant side: Secondary | ICD-10-CM | POA: Diagnosis not present

## 2015-09-25 DIAGNOSIS — R41841 Cognitive communication deficit: Secondary | ICD-10-CM | POA: Diagnosis not present

## 2015-09-25 DIAGNOSIS — R5381 Other malaise: Secondary | ICD-10-CM | POA: Diagnosis not present

## 2015-09-25 DIAGNOSIS — I517 Cardiomegaly: Secondary | ICD-10-CM | POA: Diagnosis not present

## 2015-09-25 DIAGNOSIS — M5126 Other intervertebral disc displacement, lumbar region: Secondary | ICD-10-CM | POA: Diagnosis not present

## 2015-09-25 DIAGNOSIS — I6931 Attention and concentration deficit following cerebral infarction: Secondary | ICD-10-CM | POA: Diagnosis not present

## 2015-09-25 DIAGNOSIS — M6281 Muscle weakness (generalized): Secondary | ICD-10-CM | POA: Diagnosis not present

## 2015-09-25 DIAGNOSIS — I639 Cerebral infarction, unspecified: Secondary | ICD-10-CM | POA: Diagnosis not present

## 2015-09-25 DIAGNOSIS — I69398 Other sequelae of cerebral infarction: Secondary | ICD-10-CM | POA: Diagnosis not present

## 2015-09-25 DIAGNOSIS — R270 Ataxia, unspecified: Secondary | ICD-10-CM | POA: Diagnosis not present

## 2015-09-25 DIAGNOSIS — R05 Cough: Secondary | ICD-10-CM | POA: Diagnosis not present

## 2015-09-25 DIAGNOSIS — Z5189 Encounter for other specified aftercare: Secondary | ICD-10-CM | POA: Diagnosis not present

## 2015-09-25 DIAGNOSIS — M6289 Other specified disorders of muscle: Secondary | ICD-10-CM | POA: Diagnosis not present

## 2015-09-25 DIAGNOSIS — J4540 Moderate persistent asthma, uncomplicated: Secondary | ICD-10-CM | POA: Diagnosis not present

## 2015-09-25 DIAGNOSIS — I872 Venous insufficiency (chronic) (peripheral): Secondary | ICD-10-CM | POA: Diagnosis not present

## 2015-09-25 NOTE — Progress Notes (Signed)
Occupational Therapy Discharge Summary  Patient Details  Name: Darlene Murray MRN: [id number] Date of Birth: September 15, 1921  Patient has met 7 of 11 long term goals due to improved activity tolerance, improved balance, postural control and improved attention.  Patient to discharge at overall Mod Assist level.  Patient's care partner requires assistance to provide the necessary physical and cognitive assistance at discharge.    Reasons goals not met: Impaired activity tolerance and awareness limited pt's ability to fully engage in therapeutic activities necessary to progress.  Recommendation:  Patient will benefit from ongoing skilled OT services in skilled nursing facility setting to continue to advance functional skills in the area of BADL.  Equipment: No equipment provided  Reasons for discharge: discharge from hospital  Patient/family agrees with progress made and goals achieved: Yes  OT Discharge Precautions/Restrictions  Precautions Precautions: Fall Restrictions Weight Bearing Restrictions: No Vital Signs Therapy Vitals Temp: 97.8 F (36.6 C) Temp Source: Oral Pulse Rate: 71 Resp: 18 BP: 133/61 mmHg Patient Position (if appropriate): Lying Oxygen Therapy SpO2: 97 % O2 Device: Not Delivered Pain Pain Assessment Pain Assessment: No/denies pain ADL ADL ADL Comments: Functional Assessment Tool Vision/Perception  Vision- History Baseline Vision/History: Wears glasses Wears Glasses: At all times Patient Visual Report: No change from baseline Vision- Assessment Vision Assessment?: No apparent visual deficits Perception Comments: WFL @ d/c  Cognition Overall Cognitive Status: Impaired/Different from baseline Arousal/Alertness: Awake/alert Orientation Level: Oriented X4 Attention: Selective Sustained Attention: Appears intact Selective Attention: Appears intact Memory: Impaired Memory Impairment: Decreased recall of new information;Decreased short term  memory Decreased Short Term Memory: Verbal basic;Functional basic Awareness: Impaired Awareness Impairment: Emergent impairment Problem Solving: Impaired Problem Solving Impairment: Functional basic Executive Function: Initiating;Self Monitoring;Self Correcting Initiating: Appears intact Initiating Impairment: Functional basic Self Monitoring: Impaired Self Monitoring Impairment: Functional basic Self Correcting: Impaired Self Correcting Impairment: Functional basic Safety/Judgment: Impaired Sensation Sensation Light Touch: Appears Intact Stereognosis: Appears Intact Hot/Cold: Appears Intact Proprioception: Appears Intact Coordination Gross Motor Movements are Fluid and Coordinated: Yes Fine Motor Movements are Fluid and Coordinated: Yes Coordination and Movement Description: WFL for performance of BADL Motor  Motor Motor: Hemiplegia Motor - Discharge Observations: mild left hemiplegia Mobility  Bed Mobility Bed Mobility: Rolling Right;Rolling Left;Supine to Sit;Sit to Supine Rolling Right: 6: Modified independent (Device/Increase time) Rolling Left: 5: Supervision Rolling Left Details: Verbal cues for sequencing;Verbal cues for technique Supine to Sit: 6: Modified independent (Device/Increase time) Sit to Supine: 6: Modified independent (Device/Increase time) Transfers Transfers: Sit to Stand;Stand to Sit Sit to Stand: 5: Supervision Sit to Stand Details: Verbal cues for technique Stand to Sit: 5: Supervision Stand to Sit Details (indicate cue type and reason): Verbal cues for safe use of DME/AE  Trunk/Postural Assessment  Cervical Assessment Cervical Assessment: Within Functional Limits Thoracic Assessment Thoracic Assessment: Within Functional Limits Lumbar Assessment Lumbar Assessment: Exceptions to [facility] (hx of spondylosis) Postural Control Postural Control: Within Functional Limits  Balance Balance Balance Assessed: Yes Static Sitting Balance Static Sitting  - Balance Support: Feet supported Static Sitting - Level of Assistance: 6: Modified independent (Device/Increase time) Dynamic Sitting Balance Dynamic Sitting - Balance Support: Feet supported Dynamic Sitting - Level of Assistance: 6: Modified independent (Device/Increase time) Static Standing Balance Static Standing - Balance Support: Left upper extremity supported Static Standing - Level of Assistance: 4: Min assist Extremity/Trunk Assessment RUE Assessment RUE Assessment: Exceptions to [facility] RUE AROM (degrees) Overall AROM Right Upper Extremity: Deficits;Due to premorbid status RUE Tone RUE Tone: Within Functional Limits LUE Assessment LUE Assessment:  Within Functional Limits   See Function Navigator for Current Functional Status.  Darlene Murray 09/25/2015, 8:29 AM

## 2015-09-25 NOTE — Progress Notes (Signed)
Called report to Jory Sims, LPN at [facility]. Patient transported with husband and son. Belongings and discharge package taken with patient and family.

## 2015-09-25 NOTE — Progress Notes (Signed)
[place] [facility]     PROGRESS NOTE  Subjective/Complaints:  Patient seen sitting up in bed in her bed. She is excited to be discharged today.  ROS: Denies CP, SOB, N/V/D  Objective: Vital Signs: Blood pressure 133/61, pulse 71, temperature 97.8 F (36.6 C), temperature source Oral, resp. rate 18, height 5\' 4"  (1.626 m), weight 73.029 kg (161 lb), SpO2 98 %. No results found. No results for input(s): WBC, HGB, HCT, PLT in the last 72 hours. No results for input(s): NA, K, CL, GLUCOSE, BUN, CREATININE, CALCIUM in the last 72 hours.  Invalid input(s): CO CBG (last 3)  No results for input(s): GLUCAP in the last 72 hours.  Wt Readings from Last 3 Encounters:  09/13/15 73.029 kg (161 lb)  08/02/15 72.576 kg (160 lb)  06/27/15 73.71 kg (162 lb 8 oz)    Physical Exam:  BP 133/61 mmHg  Pulse 71  Temp(Src) 97.8 F (36.6 C) (Oral)  Resp 18  Ht 5\' 4"  (1.626 m)  Wt 73.029 kg (161 lb)  BMI 27.62 kg/m2  SpO2 98% Constitutional: She appears well-developed and well-nourished. NAD. Vital signs reviewed.  HENT: Normocephalic and atraumatic.  Eyes: Conjunctivae and EOM are normal. No scleral icterus.   Cardiovascular: Normal rate and regular rhythm.  Respiratory: Effort normal and breath sounds normal. No respiratory distress.  GI: Soft. Bowel sounds are normal. She exhibits no distension.  Musculoskeletal: She exhibits no edema or tenderness.  Neurological:  She is alert and oriented x3 with cues Follows simple commands. Motor: LUE/LLE: 5/5 RUE: 4+5 shoulder abduction, elbow flexion/extensionm, 4+/5 hand grip. Ataxia. (Unchanged) RLE: 4/5 hip flexion, 4/5 knee extension, 4+/5 ankle dorsi/plantarflexion (Unchanged) Skin: Skin is warm and dry.  Psychiatric: She has a normal mood and affect. Her behavior is normal. Thought content normal  Assessment/Plan: 1. Functional deficits secondary to right posterior limb and left occipital right thalamic CVA  with history of CVA 2016 and right side residual weakness which require 3+ hours per day of interdisciplinary therapy in a comprehensive inpatient rehab setting. Physiatrist is providing close team supervision and 24 hour management of active medical problems listed below. Physiatrist and rehab team continue to assess barriers to discharge/monitor patient progress toward functional and medical goals.  Function:  Bathing Bathing position   Position: Wheelchair/chair at sink  Bathing parts Body parts bathed by patient: Right arm, Left arm, Chest, Abdomen, Front perineal area, Buttocks, Right upper leg, Left upper leg Body parts bathed by helper: Right lower leg, Left lower leg, Back  Bathing assist Assist Level: Touching or steadying assistance(Pt > 75%)      Upper Body Dressing/Undressing Upper body dressing   What is the patient wearing?: Bra, Pull over shirt/dress Bra - Perfomed by patient: Thread/unthread right bra strap, Thread/unthread left bra strap Bra - Perfomed by helper: Hook/unhook bra (pull down sports bra) Pull over shirt/dress - Perfomed by patient: Thread/unthread left sleeve, Thread/unthread right sleeve, Put head through opening, Pull shirt over trunk Pull over shirt/dress - Perfomed by helper: Thread/unthread right sleeve Button up shirt - Perfomed by patient: Thread/unthread right sleeve, Thread/unthread left sleeve Button up shirt - Perfomed by helper: Pull shirt around back (button n/a)    Upper body assist Assist Level: Touching or steadying assistance(Pt > 75%)   Set up : To obtain clothing/put away  Lower Body Dressing/Undressing Lower body dressing   What is the patient wearing?: Underwear, Pants, Shoes, [employer]  - Performed by patient: Thread/unthread right underwear leg,  Thread/unthread left underwear leg Underwear - Performed by helper: Pull underwear up/down Pants- Performed by patient: Thread/unthread right pants leg, Thread/unthread left pants  leg Pants- Performed by helper: Pull pants up/down Non-skid slipper socks- Performed by patient: Don/doff right sock, Don/doff left sock Non-skid slipper socks- Performed by helper: Don/doff right sock, Don/doff left sock   Socks - Performed by helper: Don/doff right sock, Don/doff left sock Shoes - Performed by patient: Don/doff right shoe, Don/doff left shoe (no lace shoes) Shoes - Performed by helper: Don/doff right shoe, Don/doff left shoe       TED Hose - Performed by helper: Don/doff right TED hose, Don/doff left TED hose  Lower body assist Assist for lower body dressing: Touching or steadying assistance (Pt > 75%)      Toileting Toileting   Toileting steps completed by patient: Adjust clothing prior to toileting, Performs perineal hygiene Toileting steps completed by helper: Adjust clothing after toileting, Performs perineal hygiene Toileting Assistive Devices: Grab bar or rail  Toileting assist Assist level: More than reasonable time, Touching or steadying assistance (Pt.75%)   Transfers Chair/bed transfer   Chair/bed transfer method: Stand pivot Chair/bed transfer assist level: Supervision or verbal cues Chair/bed transfer assistive device: Environmental consultant, Air cabin crew     Max distance: 40 ft Assist level: Touching or steadying assistance (Pt > 75%)   Wheelchair   Type: Manual Max wheelchair distance: 150 Assist Level: Supervision or verbal cues  Cognition Comprehension Comprehension assist level: Understands basic 90% of the time/cues < 10% of the time  Expression Expression assist level: Expresses basic 90% of the time/requires cueing < 10% of the time.  Social Interaction Social Interaction assist level: Interacts appropriately 90% of the time - Needs monitoring or encouragement for participation or interaction.  Problem Solving Problem solving assist level: Solves basic 75 - 89% of the time/requires cueing 10 - 24% of the time  Memory Memory  assist level: Recognizes or recalls 25 - 49% of the time/requires cueing 50 - 75% of the time    Medical Problem List and Plan: 1. Left-sided hemiparesis with dysarthria secondary to right posterior limb and left occipital right thalamic CVA with history of CVA 2016 and right side residual weakness  -DC today  -Will see pt for transitional care management in 7-14 days. 2. DVT Prophylaxis/Anticoagulation: Subcutaneous Lovenox. Monitor platelet counts and any signs of bleeding 3. Pain Management: Tylenol as needed.   No medications needed at this time 4. Asthma. Continue chronic prednisone. Continue Dulera  Controlled for the most part 5. Neuropsych: This patient is capable of making decisions on her own behalf. 6. Skin/Wound Care: Routine skin checks 7. Fluids/Electrolytes/Nutrition: Routine I&O   Eating 100 % of her meals  BMP within acceptable range on 3/17 8. Hyperlipidemia. Pravachol 9. Legally blind right eye  LOS (Days) 12 A FACE TO FACE EVALUATION WAS PERFORMED  Darlene Murray Lorie Phenix 09/25/2015 9:22 AM

## 2015-09-25 NOTE — Progress Notes (Signed)
Social Work Patient ID: Darlene Murray, female   DOB: January 21, 1922, 82 y.o.   MRN: [id number] Met with pt, son and husband to discuss transport to [place] feel she can go by car and plan to take her after lunch. All in agreement with this plan, and Stacey-RN and [employer] aware. Husband given packet to take to facility.

## 2015-09-26 ENCOUNTER — Ambulatory Visit: Payer: Medicare Other | Admitting: Podiatry

## 2015-09-26 ENCOUNTER — Non-Acute Institutional Stay (SKILLED_NURSING_FACILITY): Payer: Medicare Other | Admitting: Adult Health

## 2015-09-26 ENCOUNTER — Encounter: Payer: Self-pay | Admitting: Adult Health

## 2015-09-26 DIAGNOSIS — I639 Cerebral infarction, unspecified: Secondary | ICD-10-CM

## 2015-09-26 DIAGNOSIS — I6381 Other cerebral infarction due to occlusion or stenosis of small artery: Secondary | ICD-10-CM

## 2015-09-26 DIAGNOSIS — E43 Unspecified severe protein-calorie malnutrition: Secondary | ICD-10-CM | POA: Diagnosis not present

## 2015-09-26 DIAGNOSIS — J4540 Moderate persistent asthma, uncomplicated: Secondary | ICD-10-CM | POA: Diagnosis not present

## 2015-09-26 DIAGNOSIS — H548 Legal blindness, as defined in USA: Secondary | ICD-10-CM

## 2015-09-26 DIAGNOSIS — E785 Hyperlipidemia, unspecified: Secondary | ICD-10-CM | POA: Diagnosis not present

## 2015-09-26 NOTE — Progress Notes (Signed)
Patient ID: Darlene Murray, female   DOB: 10/03/1921, 82 y.o.   MRN: [id number]    DATE:  09/26/15  MRN:  [id number]  BIRTHDAY: Sep 10, 1921  Facility:  [facility] Home Location:  [place] Room Number: [id number]  LEVEL OF CARE:  SNF (31)  Contact Information    Name Relation Home Work Mobile   [place] Spouse [phone]  [phone]   [name],[name] Son [phone]         Code Status History    Date Active Date Inactive Code Status Order ID Comments User Context   09/13/2015  4:57 PM 09/13/2015  4:57 PM Full Code [id number]  Cathlyn Parsons, PA-C Inpatient   09/13/2015  4:57 PM 09/25/2015  4:03 PM Full Code [id number]  Cathlyn Parsons, PA-C Inpatient   09/11/2015  8:52 AM 09/13/2015  4:57 PM Full Code [id number]  [name], MD ED   08/02/2015  8:13 AM 08/03/2015  5:19 PM Full Code [id number]  Samella Parr, NP Inpatient   04/23/2015  5:42 PM 04/27/2015  4:30 PM Full Code [id number]  Norval Morton, MD Inpatient   04/10/2015  2:44 PM 04/20/2015  1:25 PM Full Code [id number]  Cathlyn Parsons, PA-C Inpatient   04/06/2015  4:49 PM 04/10/2015  2:44 PM Full Code [id number]  Jonetta Osgood, MD Inpatient   04/06/2015  4:29 AM 04/06/2015  4:49 PM Full Code [id number]  Lavina Hamman, MD ED       Chief Complaint  Patient presents with  . Hospitalization Follow-up    HISTORY OF PRESENT ILLNESS:  This is a 82 year old female who has been admitted to [facility] on3/21/17 from [place].She has PMH of asthma, and chronic kidney disease stage 3, CVA with right residual weakness,  UTI, hyperlipidemia, herniated nucleus pulposus, lumbar and received inpatient rehab services in October 2016. She was then able to ambulate short distances with a walker. She recently presented to the hospital, early this month, with left-sided weakness and slurred speech. MRI of the brain showed acute nonhemorrhagic infarct, right thalamus and posterior limb, right  internal capsule, tiny acute medial left occipital lobe infarct and remote bilateral cerebellar infarcts. MRA of the head and neck showed moderate to marked narrowing proximal right vertebral artery. Echocardiogram with ejection fraction of [id number], grade 1 diastolic dysfunction. She did not receive tPA. She was maintained on aspirin and Plavix therapy. She then had acute rehabilitation.  She is now admitted to [facility] for a short-term rehabilitation.   PAST MEDICAL HISTORY:  Past Medical History  Diagnosis Date  . Blindness   . Asthmatic bronchitis   . Asthma   . Fibrocystic breast disease   . Allergic rhinitis   . Hyperlipidemia   . Venous insufficiency     lower extremities  . HNP (herniated nucleus pulposus), lumbar     L4-L5  . Left ventricular hypertrophy 2008    mild  . Antibiotic-associated diarrhea   . Stroke (cerebrum) (HCC)      CURRENT MEDICATIONS: Reviewed  Patient's Medications  New Prescriptions   No medications on file  Previous Medications   ALBUTEROL (PROVENTIL HFA;VENTOLIN HFA) 108 (90 BASE) MCG/ACT INHALER    Inhale 2 puffs into the lungs.   ASPIRIN 81 MG CHEWABLE TABLET    Chew 81 mg by mouth daily.   ATORVASTATIN (LIPITOR) 10 MG TABLET    Take 10 mg by mouth daily.   CLOPIDOGREL (PLAVIX) 75 MG TABLET  Take 1 tablet (75 mg total) by mouth daily.   MOMETASONE-FORMOTEROL (DULERA) 100-5 MCG/ACT AERO    Inhale 2 puffs into the lungs 2 (two) times daily.   PREDNISONE (DELTASONE) 5 MG TABLET    Take 1 tablet (5 mg total) by mouth daily.  Modified Medications   No medications on file  Discontinued Medications   ALBUTEROL (PROVENTIL HFA;VENTOLIN HFA) 108 (90 BASE) MCG/ACT INHALER    Inhale 2 puffs into the lungs every 6 (six) hours as needed for wheezing or shortness of breath.   ASPIRIN 81 MG TABLET    Take 81 mg by mouth daily.   CALCIUM CARBONATE (OS-CAL) 600 MG TABS    Take 600 mg by mouth daily.     CHOLECALCIFEROL (VITAMIN D) 1000 UNITS TABLET    Take  1,000 Units by mouth daily.   MULTIPLE VITAMIN (MULTIVITAMIN) TABLET    Take 1 tablet by mouth daily.   PRAVASTATIN (PRAVACHOL) 20 MG TABLET    Take 1 tablet (20 mg total) by mouth daily at 6 PM.     No Known Allergies   REVIEW OF SYSTEMS:  GENERAL: no change in appetite, no fatigue, no weight changes, no fever, chills or weakness EYES: Legally blind right eye EARS: Denies change in hearing, ringing in ears, or earache NOSE: Denies nasal congestion or epistaxis MOUTH and THROAT: Denies oral discomfort, gingival pain or bleeding, pain from teeth or hoarseness   RESPIRATORY: no cough, SOB, DOE, wheezing, hemoptysis CARDIAC: no chest pain, edema or palpitations GI: no abdominal pain, diarrhea, constipation, heart burn, nausea or vomiting GU: Denies dysuria, frequency, hematuria, incontinence, or discharge PSYCHIATRIC: Denies feeling of depression or anxiety. No report of hallucinations, insomnia, paranoia, or agitation   PHYSICAL EXAMINATION  GENERAL APPEARANCE: Well nourished. In no acute distress. Normal body habitus SKIN:  Skin is warm and dry.  HEAD: Normal in size and contour. No evidence of trauma EYES: Lids open and close normally. No blepharitis, entropion or ectropion. PERRL. Conjunctivae are clear and sclerae are white. Lenses are without opacity EARS: Pinnae are normal. Patient hears normal voice tunes of the examiner MOUTH and THROAT: Lips are without lesions. Oral mucosa is moist and without lesions. Tongue is normal in shape, size, and color and without lesions NECK: supple, trachea midline, no neck masses, no thyroid tenderness, no thyromegaly LYMPHATICS: no LAN in the neck, no supraclavicular LAN RESPIRATORY: breathing is even & unlabored, BS CTAB CARDIAC: RRR, no murmur,no extra heart sounds, no edema GI: abdomen soft, normal BS, no masses, no tenderness, no hepatomegaly, no splenomegaly EXTREMITIES:  RUE and RLE 4/5 strength; LUE and LLE 5/5 strength; right shoulder  has limited ROM PSYCHIATRIC: Alert and oriented X 3. Affect and behavior are appropriate  LABS/RADIOLOGY: Labs reviewed: Basic Metabolic Panel:  Recent Labs  09/11/15 0544 09/11/15 0552  09/14/15 0440 09/20/15 0537 09/21/15 0537  NA 143 143  --  144  --  143  K 4.1 4.1  --  3.8  --  3.8  CL 108 104  --  109  --  109  CO2 27  --   --  25  --  27  GLUCOSE 80 75  --  76  --  81  BUN 16 21*  --  13  --  13  CREATININE 1.11* 1.00  < > 1.05* 1.11* 1.00  CALCIUM 8.8*  --   --  8.5*  --  8.8*  < > = values in this interval not displayed. Liver  Function Tests:  Recent Labs  08/02/15 0513 09/11/15 0544 09/14/15 0440  AST 21 20 18   ALT 18 17 15   ALKPHOS 42 43 44  BILITOT 0.6 0.7 0.6  PROT 5.9* 5.8* 5.3*  ALBUMIN 3.1* 3.0* 2.7*    Recent Labs  04/05/15 2252  LIPASE 23    CBC:  Recent Labs  09/11/15 0544  09/13/15 1709 09/14/15 0440 09/21/15 0537  WBC 7.7  --  6.9 7.1 7.4  NEUTROABS 3.4  --   --  3.3 3.3  HGB 13.3  < > 13.5 13.1 12.5  HCT 41.2  < > 41.7 40.6 39.7  MCV 88.4  --  88.2 87.9 87.8  PLT 227  --  229 233 236  < > = values in this interval not displayed.  Lipid Panel:  Recent Labs  04/07/15 0622 08/03/15 0230 09/12/15 0340  HDL 48 47 39*   Cardiac Enzymes:  Recent Labs  12/16/14 1025 04/23/15 1856  TROPONINI <0.03 <0.03    CBG:  Recent Labs  04/10/15 1700 08/02/15 0524 09/11/15 0544  GLUCAP 112* 80 78      Mr Mra Head Wo Contrast  09/11/2015  CLINICAL DATA:  82 year old female with slurred speech and left-sided weakness with left facial droop. History of stroke. Subsequent encounter. EXAM: MRI HEAD WITHOUT AND WITH CONTRAST AND MRA HEAD WITHOUT CONTRAST AND MRI NECK WITHOUT AND WITH CONTRAST TECHNIQUE: Multiplanar, multiecho pulse sequences of the brain and surrounding structures were obtained without and with intravenous contrast. Angiographic images of the head were obtained using MRA technique without contrast. Multiplanar,  multiecho pulse sequences of the neck and surrounding structures were obtained without and with intravenous contrast. CONTRAST:  71mL MULTIHANCE GADOBENATE DIMEGLUMINE 529 MG/ML IV SOLN COMPARISON:  09/11/2015 head CT.  08/02/2015 brain MR. FINDINGS: MRI HEAD FINDINGS Exam is motion degraded. Acute nonhemorrhagic infarct right thalamus and posterior limb right internal capsule. Question tiny acute medial left occipital lobe infarct. Remote bilateral cerebellar infarcts. Remote infarct right middle cerebellar peduncle. Remote thalamic infarcts. Mild small vessel disease changes. Global atrophy without hydrocephalus. No intracranial hemorrhage. No intracranial enhancing lesion. Longstanding complete opacification right sphenoid sinus without expansion. Inspissated material suspected. Mucosal thickening remainder of paranasal sinuses with opacification posterior left ethmoid sinus air cells. Elongated right globe.  Post lens replacement. Partially empty sella. Mild spinal stenosis upper cervical spine. Cervical medullary junction unremarkable. MRA HEAD FINDINGS Exam is motion degraded. Mild narrowing pre cavernous and supraclinoid segment of the internal carotid artery bilaterally with ectasia of cavernous segment bilaterally. Fetal contribution to the right posterior cerebral artery. Mild narrowing A1 segment anterior cerebral artery bilaterally. Moderate narrowing A2 segment anterior cerebral artery bilaterally. Moderate to marked narrowing middle cerebral artery branches bilaterally. Marked narrowing distal left vertebral artery. Moderate to marked narrowing distal right vertebral artery. Mild narrowing basilar artery. Nonvisualized posterior inferior cerebellar arteries, anterior inferior cerebellar arteries and superior cerebellar arteries. Mark narrowing posterior cerebral arteries bilaterally more notable on the left. Prominent basilar tip may reflect atherosclerotic changes rather than saccular aneurysm. MRI  NECK FINDINGS Exam is motion degraded. Ectatic common carotid arteries bilaterally. Loss of signal right common carotid artery may be related to artifact rather than true stenosis. Appearance of hemodynamically significant stenosis proximal internal carotid artery bilaterally may be accentuated by motion artifact. Mild to moderate narrowing proximal 3 cm internal carotid artery bilaterally. Poor delineation majority of the left vertebral artery. Moderate to marked narrowing proximal right vertebral artery. Mild to moderate narrowing proximal right subclavian  artery. Moderate to marked tandem stenosis left subclavian artery. IMPRESSION: MRI HEAD Exam is motion degraded. Acute nonhemorrhagic infarct right thalamus and posterior limb right internal capsule. Question tiny acute medial left occipital lobe infarct. Remote bilateral cerebellar infarcts. Remote infarct right middle cerebellar peduncle. Remote thalamic infarcts. Mild small vessel disease changes. Global atrophy without hydrocephalus. No intracranial hemorrhage. No intracranial enhancing lesion. Longstanding complete opacification right sphenoid sinus without expansion. Inspissated material suspected. Mucosal thickening remainder of paranasal sinuses with opacification posterior left ethmoid sinus air cells. Elongated right globe.  Post lens replacement. MRA HEAD Exam is motion degraded. Mild narrowing pre cavernous and supraclinoid segment of the internal carotid artery bilaterally with ectasia of cavernous segment bilaterally. Mild narrowing A1 segment anterior cerebral artery bilaterally. Moderate narrowing A2 segment anterior cerebral artery bilaterally. Moderate to marked narrowing middle cerebral artery branches bilaterally. Marked narrowing distal left vertebral artery. Moderate to marked narrowing distal right vertebral artery. Mild narrowing basilar artery. Nonvisualized posterior inferior cerebellar arteries, anterior inferior cerebellar arteries  and superior cerebellar arteries. Mark narrowing posterior cerebral arteries bilaterally more notable on the left. Prominent basilar tip may reflect atherosclerotic changes rather than saccular aneurysm. MRI NECK Exam is motion degraded. Ectatic common carotid arteries bilaterally. Loss of signal right common carotid artery may be related to artifact rather than true stenosis. Appearance of hemodynamically significant stenosis proximal internal carotid artery bilaterally may be accentuated by motion artifact. Mild to moderate narrowing proximal 3 cm internal carotid artery bilaterally. Poor delineation majority of the left vertebral artery. Moderate to marked narrowing proximal right vertebral artery. Mild to moderate narrowing proximal right subclavian artery. Moderate to marked tandem stenosis left subclavian artery. Electronically Signed   By: Genia Del M.D.   On: 09/11/2015 10:33   Mr Angiogram Neck W Wo Contrast  09/11/2015  CLINICAL DATA:  82 year old female with slurred speech and left-sided weakness with left facial droop. History of stroke. Subsequent encounter. EXAM: MRI HEAD WITHOUT AND WITH CONTRAST AND MRA HEAD WITHOUT CONTRAST AND MRI NECK WITHOUT AND WITH CONTRAST TECHNIQUE: Multiplanar, multiecho pulse sequences of the brain and surrounding structures were obtained without and with intravenous contrast. Angiographic images of the head were obtained using MRA technique without contrast. Multiplanar, multiecho pulse sequences of the neck and surrounding structures were obtained without and with intravenous contrast. CONTRAST:  21mL MULTIHANCE GADOBENATE DIMEGLUMINE 529 MG/ML IV SOLN COMPARISON:  09/11/2015 head CT.  08/02/2015 brain MR. FINDINGS: MRI HEAD FINDINGS Exam is motion degraded. Acute nonhemorrhagic infarct right thalamus and posterior limb right internal capsule. Question tiny acute medial left occipital lobe infarct. Remote bilateral cerebellar infarcts. Remote infarct right middle  cerebellar peduncle. Remote thalamic infarcts. Mild small vessel disease changes. Global atrophy without hydrocephalus. No intracranial hemorrhage. No intracranial enhancing lesion. Longstanding complete opacification right sphenoid sinus without expansion. Inspissated material suspected. Mucosal thickening remainder of paranasal sinuses with opacification posterior left ethmoid sinus air cells. Elongated right globe.  Post lens replacement. Partially empty sella. Mild spinal stenosis upper cervical spine. Cervical medullary junction unremarkable. MRA HEAD FINDINGS Exam is motion degraded. Mild narrowing pre cavernous and supraclinoid segment of the internal carotid artery bilaterally with ectasia of cavernous segment bilaterally. Fetal contribution to the right posterior cerebral artery. Mild narrowing A1 segment anterior cerebral artery bilaterally. Moderate narrowing A2 segment anterior cerebral artery bilaterally. Moderate to marked narrowing middle cerebral artery branches bilaterally. Marked narrowing distal left vertebral artery. Moderate to marked narrowing distal right vertebral artery. Mild narrowing basilar artery. Nonvisualized posterior inferior cerebellar arteries,  anterior inferior cerebellar arteries and superior cerebellar arteries. Mark narrowing posterior cerebral arteries bilaterally more notable on the left. Prominent basilar tip may reflect atherosclerotic changes rather than saccular aneurysm. MRI NECK FINDINGS Exam is motion degraded. Ectatic common carotid arteries bilaterally. Loss of signal right common carotid artery may be related to artifact rather than true stenosis. Appearance of hemodynamically significant stenosis proximal internal carotid artery bilaterally may be accentuated by motion artifact. Mild to moderate narrowing proximal 3 cm internal carotid artery bilaterally. Poor delineation majority of the left vertebral artery. Moderate to marked narrowing proximal right vertebral  artery. Mild to moderate narrowing proximal right subclavian artery. Moderate to marked tandem stenosis left subclavian artery. IMPRESSION: MRI HEAD Exam is motion degraded. Acute nonhemorrhagic infarct right thalamus and posterior limb right internal capsule. Question tiny acute medial left occipital lobe infarct. Remote bilateral cerebellar infarcts. Remote infarct right middle cerebellar peduncle. Remote thalamic infarcts. Mild small vessel disease changes. Global atrophy without hydrocephalus. No intracranial hemorrhage. No intracranial enhancing lesion. Longstanding complete opacification right sphenoid sinus without expansion. Inspissated material suspected. Mucosal thickening remainder of paranasal sinuses with opacification posterior left ethmoid sinus air cells. Elongated right globe.  Post lens replacement. MRA HEAD Exam is motion degraded. Mild narrowing pre cavernous and supraclinoid segment of the internal carotid artery bilaterally with ectasia of cavernous segment bilaterally. Mild narrowing A1 segment anterior cerebral artery bilaterally. Moderate narrowing A2 segment anterior cerebral artery bilaterally. Moderate to marked narrowing middle cerebral artery branches bilaterally. Marked narrowing distal left vertebral artery. Moderate to marked narrowing distal right vertebral artery. Mild narrowing basilar artery. Nonvisualized posterior inferior cerebellar arteries, anterior inferior cerebellar arteries and superior cerebellar arteries. Mark narrowing posterior cerebral arteries bilaterally more notable on the left. Prominent basilar tip may reflect atherosclerotic changes rather than saccular aneurysm. MRI NECK Exam is motion degraded. Ectatic common carotid arteries bilaterally. Loss of signal right common carotid artery may be related to artifact rather than true stenosis. Appearance of hemodynamically significant stenosis proximal internal carotid artery bilaterally may be accentuated by motion  artifact. Mild to moderate narrowing proximal 3 cm internal carotid artery bilaterally. Poor delineation majority of the left vertebral artery. Moderate to marked narrowing proximal right vertebral artery. Mild to moderate narrowing proximal right subclavian artery. Moderate to marked tandem stenosis left subclavian artery. Electronically Signed   By: Genia Del M.D.   On: 09/11/2015 10:33   Mr Jeri Cos [id number] Contrast  09/11/2015  CLINICAL DATA:  82 year old female with slurred speech and left-sided weakness with left facial droop. History of stroke. Subsequent encounter. EXAM: MRI HEAD WITHOUT AND WITH CONTRAST AND MRA HEAD WITHOUT CONTRAST AND MRI NECK WITHOUT AND WITH CONTRAST TECHNIQUE: Multiplanar, multiecho pulse sequences of the brain and surrounding structures were obtained without and with intravenous contrast. Angiographic images of the head were obtained using MRA technique without contrast. Multiplanar, multiecho pulse sequences of the neck and surrounding structures were obtained without and with intravenous contrast. CONTRAST:  50mL MULTIHANCE GADOBENATE DIMEGLUMINE 529 MG/ML IV SOLN COMPARISON:  09/11/2015 head CT.  08/02/2015 brain MR. FINDINGS: MRI HEAD FINDINGS Exam is motion degraded. Acute nonhemorrhagic infarct right thalamus and posterior limb right internal capsule. Question tiny acute medial left occipital lobe infarct. Remote bilateral cerebellar infarcts. Remote infarct right middle cerebellar peduncle. Remote thalamic infarcts. Mild small vessel disease changes. Global atrophy without hydrocephalus. No intracranial hemorrhage. No intracranial enhancing lesion. Longstanding complete opacification right sphenoid sinus without expansion. Inspissated material suspected. Mucosal thickening remainder of paranasal sinuses with opacification posterior  left ethmoid sinus air cells. Elongated right globe.  Post lens replacement. Partially empty sella. Mild spinal stenosis upper cervical spine.  Cervical medullary junction unremarkable. MRA HEAD FINDINGS Exam is motion degraded. Mild narrowing pre cavernous and supraclinoid segment of the internal carotid artery bilaterally with ectasia of cavernous segment bilaterally. Fetal contribution to the right posterior cerebral artery. Mild narrowing A1 segment anterior cerebral artery bilaterally. Moderate narrowing A2 segment anterior cerebral artery bilaterally. Moderate to marked narrowing middle cerebral artery branches bilaterally. Marked narrowing distal left vertebral artery. Moderate to marked narrowing distal right vertebral artery. Mild narrowing basilar artery. Nonvisualized posterior inferior cerebellar arteries, anterior inferior cerebellar arteries and superior cerebellar arteries. Mark narrowing posterior cerebral arteries bilaterally more notable on the left. Prominent basilar tip may reflect atherosclerotic changes rather than saccular aneurysm. MRI NECK FINDINGS Exam is motion degraded. Ectatic common carotid arteries bilaterally. Loss of signal right common carotid artery may be related to artifact rather than true stenosis. Appearance of hemodynamically significant stenosis proximal internal carotid artery bilaterally may be accentuated by motion artifact. Mild to moderate narrowing proximal 3 cm internal carotid artery bilaterally. Poor delineation majority of the left vertebral artery. Moderate to marked narrowing proximal right vertebral artery. Mild to moderate narrowing proximal right subclavian artery. Moderate to marked tandem stenosis left subclavian artery. IMPRESSION: MRI HEAD Exam is motion degraded. Acute nonhemorrhagic infarct right thalamus and posterior limb right internal capsule. Question tiny acute medial left occipital lobe infarct. Remote bilateral cerebellar infarcts. Remote infarct right middle cerebellar peduncle. Remote thalamic infarcts. Mild small vessel disease changes. Global atrophy without hydrocephalus. No  intracranial hemorrhage. No intracranial enhancing lesion. Longstanding complete opacification right sphenoid sinus without expansion. Inspissated material suspected. Mucosal thickening remainder of paranasal sinuses with opacification posterior left ethmoid sinus air cells. Elongated right globe.  Post lens replacement. MRA HEAD Exam is motion degraded. Mild narrowing pre cavernous and supraclinoid segment of the internal carotid artery bilaterally with ectasia of cavernous segment bilaterally. Mild narrowing A1 segment anterior cerebral artery bilaterally. Moderate narrowing A2 segment anterior cerebral artery bilaterally. Moderate to marked narrowing middle cerebral artery branches bilaterally. Marked narrowing distal left vertebral artery. Moderate to marked narrowing distal right vertebral artery. Mild narrowing basilar artery. Nonvisualized posterior inferior cerebellar arteries, anterior inferior cerebellar arteries and superior cerebellar arteries. Mark narrowing posterior cerebral arteries bilaterally more notable on the left. Prominent basilar tip may reflect atherosclerotic changes rather than saccular aneurysm. MRI NECK Exam is motion degraded. Ectatic common carotid arteries bilaterally. Loss of signal right common carotid artery may be related to artifact rather than true stenosis. Appearance of hemodynamically significant stenosis proximal internal carotid artery bilaterally may be accentuated by motion artifact. Mild to moderate narrowing proximal 3 cm internal carotid artery bilaterally. Poor delineation majority of the left vertebral artery. Moderate to marked narrowing proximal right vertebral artery. Mild to moderate narrowing proximal right subclavian artery. Moderate to marked tandem stenosis left subclavian artery. Electronically Signed   By: Genia Del M.D.   On: 09/11/2015 10:33   Ct Head Code Stroke W/o Cm  09/11/2015  CLINICAL DATA:  82 year old female with slurred speech, left-sided  weakness and left facial droop. History stroke. Initial encounter. EXAM: CT HEAD WITHOUT CONTRAST TECHNIQUE: Contiguous axial images were obtained from the base of the skull through the vertex without intravenous contrast. COMPARISON:  08/02/2015 brain MR and head CT. FINDINGS: No intracranial hemorrhage. Several prior remote infarcts with appearance of evolution/progression of right thalamic and posterior limb right internal capsule infarct. Moderate  small vessel disease changes. Global atrophy without hydrocephalus. No intracranial mass lesion noted on this unenhanced exam. Exophthalmos. Opacification right sphenoid sinus. Mild mucosal thickening maxillary sinuses and ethmoid sinus air cells greater on the left. IMPRESSION: No intracranial hemorrhage. Several prior remote infarcts with appearance of evolution/progression of right thalamic and posterior limb right internal capsule infarct. Moderate small vessel disease changes. Global atrophy without hydrocephalus. Opacification right sphenoid sinus. Mild mucosal thickening maxillary sinuses and ethmoid sinus air cells greater on the left. These results were called by telephone at the time of interpretation on 09/11/2015 at 6:39 am to Dr. Everlene Balls , who verbally acknowledged these results. Electronically Signed   By: Genia Del M.D.   On: 09/11/2015 06:41    ASSESSMENT/PLAN:  CVA - for rehabilitation; continue aspirin 81 mg by mouth daily and Plavix 75 mg by mouth daily; check CBC  Asthma - stable; continue Dulera 100-5 mcg 2 puffs twice daily, Prednisone 5 mg daily and start albuterol HFA 108 (90 base) mcg/act inhaler 2 puffs into the lungs every 6 hours when necessary  Hyperlipidemia - continue Pravachol 20 mg by mouth daily; check CMP  Legally blind right eye - continue supportive care with fall precaution  Protein calorie malnutrition, severe - albumin 2.7; start Procel 2 scoops by mouth twice a day; RD consult     Goals of care:   Short-term rehabilitation     [facility], NP [facility] [phone]

## 2015-09-27 ENCOUNTER — Telehealth: Payer: Self-pay

## 2015-09-27 DIAGNOSIS — Z5189 Encounter for other specified aftercare: Secondary | ICD-10-CM | POA: Diagnosis not present

## 2015-09-27 DIAGNOSIS — M7501 Adhesive capsulitis of right shoulder: Secondary | ICD-10-CM | POA: Diagnosis not present

## 2015-09-27 DIAGNOSIS — R5381 Other malaise: Secondary | ICD-10-CM | POA: Diagnosis not present

## 2015-09-27 DIAGNOSIS — R2681 Unsteadiness on feet: Secondary | ICD-10-CM | POA: Diagnosis not present

## 2015-09-27 DIAGNOSIS — H5441 Blindness, right eye, normal vision left eye: Secondary | ICD-10-CM | POA: Diagnosis not present

## 2015-09-27 DIAGNOSIS — I6931 Attention and concentration deficit following cerebral infarction: Secondary | ICD-10-CM | POA: Diagnosis not present

## 2015-09-27 LAB — BASIC METABOLIC PANEL
BUN: 20 mg/dL (ref 4–21)
CREATININE: 1 mg/dL (ref 0.5–1.1)
Glucose: 77 mg/dL
Potassium: 4.1 mmol/L (ref 3.4–5.3)
Sodium: 145 mmol/L (ref 137–147)

## 2015-09-27 LAB — CBC AND DIFFERENTIAL
HCT: 40 % (ref 36–46)
Hemoglobin: 12.9 g/dL (ref 12.0–16.0)
Neutrophils Absolute: 4 /uL
PLATELETS: 258 10*3/uL (ref 150–399)
WBC: 9.3 10^3/mL

## 2015-09-27 NOTE — Telephone Encounter (Signed)
Spoke with Insurance underwriter for [facility]. Made aware of Appointment with Dr.Patel on 10/05/15 @ 1245. Appointment noted.

## 2015-09-28 ENCOUNTER — Non-Acute Institutional Stay (SKILLED_NURSING_FACILITY): Payer: Medicare Other | Admitting: Internal Medicine

## 2015-09-28 ENCOUNTER — Encounter: Payer: Self-pay | Admitting: Internal Medicine

## 2015-09-28 DIAGNOSIS — Z5189 Encounter for other specified aftercare: Secondary | ICD-10-CM | POA: Diagnosis not present

## 2015-09-28 DIAGNOSIS — R531 Weakness: Secondary | ICD-10-CM

## 2015-09-28 DIAGNOSIS — G8194 Hemiplegia, unspecified affecting left nondominant side: Secondary | ICD-10-CM | POA: Diagnosis not present

## 2015-09-28 DIAGNOSIS — E785 Hyperlipidemia, unspecified: Secondary | ICD-10-CM | POA: Diagnosis not present

## 2015-09-28 DIAGNOSIS — I639 Cerebral infarction, unspecified: Secondary | ICD-10-CM | POA: Diagnosis not present

## 2015-09-28 DIAGNOSIS — R5381 Other malaise: Secondary | ICD-10-CM | POA: Diagnosis not present

## 2015-09-28 DIAGNOSIS — E46 Unspecified protein-calorie malnutrition: Secondary | ICD-10-CM | POA: Diagnosis not present

## 2015-09-28 DIAGNOSIS — J4540 Moderate persistent asthma, uncomplicated: Secondary | ICD-10-CM | POA: Diagnosis not present

## 2015-09-28 DIAGNOSIS — M6289 Other specified disorders of muscle: Secondary | ICD-10-CM

## 2015-09-28 DIAGNOSIS — M7501 Adhesive capsulitis of right shoulder: Secondary | ICD-10-CM | POA: Diagnosis not present

## 2015-09-28 DIAGNOSIS — R05 Cough: Secondary | ICD-10-CM | POA: Diagnosis not present

## 2015-09-28 DIAGNOSIS — I6381 Other cerebral infarction due to occlusion or stenosis of small artery: Secondary | ICD-10-CM

## 2015-09-28 DIAGNOSIS — R059 Cough, unspecified: Secondary | ICD-10-CM

## 2015-09-28 DIAGNOSIS — I6931 Attention and concentration deficit following cerebral infarction: Secondary | ICD-10-CM | POA: Diagnosis not present

## 2015-09-28 DIAGNOSIS — H548 Legal blindness, as defined in USA: Secondary | ICD-10-CM

## 2015-09-28 DIAGNOSIS — H5441 Blindness, right eye, normal vision left eye: Secondary | ICD-10-CM | POA: Diagnosis not present

## 2015-09-28 DIAGNOSIS — R2681 Unsteadiness on feet: Secondary | ICD-10-CM | POA: Diagnosis not present

## 2015-09-28 NOTE — Progress Notes (Signed)
LOCATION: [facility]  PCP: Gennette Pac, MD   Code Status: Full Code   Goals of care: Advanced Directive information Advanced Directives 09/13/2015  Does patient have an advance directive? No  Type of Academic librarian       Extended Emergency Contact Information Primary Emergency Contact: C[place], [place] Address: [address]          [place] [address] [name] Phone: [phone] Mobile Phone: [phone] Relation: Spouse Secondary Emergency Contact: [name],[name]  [place] Phone: [phone] Relation: Son   No Known Allergies  Chief Complaint  Patient presents with  . New Admit To SNF    New Admission     HPI:  Patient is a 82 y.o. female seen today for short term rehabilitation post hospital admission from 09/13/15- 09/25/15 with acute CVA with left sided weakness. She was seen by neurology. She is on aspirin and plavix and is also on lovenox for dvt prophylaxis. She was in inpatient rehabilitation. She is seen in her room today with her husband and son present. She denies any concern this am. No new concern from nursing staff. She has PMH of CVA with right sided residual weakness, HLD, asthma among others.   Review of Systems:  Constitutional: Negative for fever, chills, diaphoresis. Feels tired.  HENT: Negative for headache, congestion, hearing loss, sore throat, difficulty swallowing. Positive for nasal discharge. Eyes: Negative for blurred vision, double vision and discharge. Wears glasses.  Respiratory: Negative for shortness of breath and wheezing.  Positive for cough with yellow phlegm  Cardiovascular: Negative for chest pain, palpitations. Positive for leg swelling.  Gastrointestinal: Negative for heartburn, nausea, vomiting, abdominal pain. Last bowel movement was today. Regular stool.  Genitourinary: Negative for dysuria and flank pain.  Musculoskeletal: Negative  for back pain, fall in the facility.  Skin: Negative for itching, rash.  Neurological: Negative for dizziness. Positive for generalized weakness. Psychiatric/Behavioral: Negative for depression.    Past Medical History  Diagnosis Date  . Blindness   . Asthmatic bronchitis   . Asthma   . Fibrocystic breast disease   . Allergic rhinitis   . Hyperlipidemia   . Venous insufficiency     lower extremities  . HNP (herniated nucleus pulposus), lumbar     L4-L5  . Left ventricular hypertrophy 2008    mild  . Antibiotic-associated diarrhea   . Stroke (cerebrum) [facility])    Past Surgical History  Procedure Laterality Date  . Cataract extraction    . Colonoscopy     Social History:   reports that she has been passively smoking.  She has never used smokeless tobacco. She reports that she does not drink alcohol or use illicit drugs.  Family History  Problem Relation Age of Onset  . Heart attack Sister   . Alzheimer's disease Sister   . Asthma Son     Medications:   Medication List       This list is accurate as of: 09/28/15 11:36 AM.  Always use your most recent med list.               albuterol 108 (90 Base) MCG/ACT inhaler  Commonly known as:  PROVENTIL HFA;VENTOLIN HFA  Inhale 2 puffs into the lungs every 6 (six) hours as needed for wheezing or shortness of breath.     aspirin 81 MG chewable tablet  Chew 81 mg by mouth daily.     atorvastatin 10 MG  tablet  Commonly known as:  LIPITOR  Take 10 mg by mouth daily.     clopidogrel 75 MG tablet  Commonly known as:  PLAVIX  Take 1 tablet (75 mg total) by mouth daily.     mometasone-formoterol 100-5 MCG/ACT Aero  Commonly known as:  DULERA  Inhale 2 puffs into the lungs 2 (two) times daily.     predniSONE 5 MG tablet  Commonly known as:  DELTASONE  Take 1 tablet (5 mg total) by mouth daily.     PROCEL 100 PO  Take 2 scoop by mouth 2 (two) times daily.        Immunizations: Immunization History  Administered  Date(s) Administered  . Influenza Split 03/20/2011, 03/31/2012, 05/07/2013, 02/27/2014  . Influenza Whole 04/06/2010  . Influenza-Unspecified 03/08/2015  . PPD Test 09/25/2015  . Pneumococcal Conjugate-13 12/08/2013  . Pneumococcal Polysaccharide-23 12/08/2009  . Tdap 12/08/2013     Physical Exam: Filed Vitals:   09/28/15 1127  BP: 124/72  Pulse: 78  Temp: 98 F (36.7 C)  TempSrc: Oral  Resp: 18  Height: 5\' 4"  (1.626 m)  Weight: 161 lb (73.029 kg)  SpO2: 98%   Body mass index is 27.62 kg/(m^2).  General- elderly female, well built, in no acute distress Head- normocephalic, atraumatic Nose- no maxillary or frontal sinus tenderness, no nasal discharge Throat- moist mucus membrane, has dentures  Eyes- legally blind in right eye, no pallor, no icterus, no discharge, normal conjunctiva, normal sclera Neck- no cervical lymphadenopathy Cardiovascular- normal s1,s2, no murmur, trace leg edema Respiratory- bilateral clear to auscultation, + wheeze, no rhonchi, no crackles, no use of accessory muscles Abdomen- bowel sounds present, soft, non tender Musculoskeletal- able to move all 4 extremities, right sided UE strength 3/5, LUE strength 5/5, LLE strength 5/5, RLE strength 4/5, generalized weakness Neurological- alert and oriented to person and place only Skin- warm and dry Psychiatry- normal mood and affect    Labs reviewed: Basic Metabolic Panel:  Recent Labs  09/11/15 0544 09/11/15 0552  09/14/15 0440 09/20/15 0537 09/21/15 09/21/15 0537  NA 143 143  --  144  --  143 143  K 4.1 4.1  --  3.8  --   --  3.8  CL 108 104  --  109  --   --  109  CO2 27  --   --  25  --   --  27  GLUCOSE 80 75  --  76  --   --  81  BUN 16 21*  --  13  --  13 13  CREATININE 1.11* 1.00  < > 1.05* 1.11* 1.0 1.00  CALCIUM 8.8*  --   --  8.5*  --   --  8.8*  < > = values in this interval not displayed. Liver Function Tests:  Recent Labs  08/02/15 0513 09/11/15 0544 09/14/15 0440  AST  21 20 18   ALT 18 17 15   ALKPHOS 42 43 44  BILITOT 0.6 0.7 0.6  PROT 5.9* 5.8* 5.3*  ALBUMIN 3.1* 3.0* 2.7*    Recent Labs  04/05/15 2252  LIPASE 23   No results for input(s): AMMONIA in the last 8760 hours. CBC:  Recent Labs  09/11/15 0544  09/13/15 1709 09/14/15 0440 09/21/15 09/21/15 0537  WBC 7.7  --  6.9 7.1 7.4 7.4  NEUTROABS 3.4  --   --  3.3  --  3.3  HGB 13.3  < > 13.5 13.1  --  12.5  HCT  41.2  < > 41.7 40.6  --  39.7  MCV 88.4  --  88.2 87.9  --  87.8  PLT 227  --  229 233  --  236  < > = values in this interval not displayed. Cardiac Enzymes:  Recent Labs  12/16/14 1025 04/23/15 1856  TROPONINI <0.03 <0.03   BNP: Invalid input(s): POCBNP CBG:  Recent Labs  04/10/15 1700 08/02/15 0524 09/11/15 0544  GLUCAP 112* 80 78    Radiological Exams: Mr Jodene Nam Head Wo Contrast  09/11/2015  CLINICAL DATA:  82 year old female with slurred speech and left-sided weakness with left facial droop. History of stroke. Subsequent encounter. EXAM: MRI HEAD WITHOUT AND WITH CONTRAST AND MRA HEAD WITHOUT CONTRAST AND MRI NECK WITHOUT AND WITH CONTRAST TECHNIQUE: Multiplanar, multiecho pulse sequences of the brain and surrounding structures were obtained without and with intravenous contrast. Angiographic images of the head were obtained using MRA technique without contrast. Multiplanar, multiecho pulse sequences of the neck and surrounding structures were obtained without and with intravenous contrast. CONTRAST:  62mL MULTIHANCE GADOBENATE DIMEGLUMINE 529 MG/ML IV SOLN COMPARISON:  09/11/2015 head CT.  08/02/2015 brain MR. FINDINGS: MRI HEAD FINDINGS Exam is motion degraded. Acute nonhemorrhagic infarct right thalamus and posterior limb right internal capsule. Question tiny acute medial left occipital lobe infarct. Remote bilateral cerebellar infarcts. Remote infarct right middle cerebellar peduncle. Remote thalamic infarcts. Mild small vessel disease changes. Global atrophy without  hydrocephalus. No intracranial hemorrhage. No intracranial enhancing lesion. Longstanding complete opacification right sphenoid sinus without expansion. Inspissated material suspected. Mucosal thickening remainder of paranasal sinuses with opacification posterior left ethmoid sinus air cells. Elongated right globe.  Post lens replacement. Partially empty sella. Mild spinal stenosis upper cervical spine. Cervical medullary junction unremarkable. MRA HEAD FINDINGS Exam is motion degraded. Mild narrowing pre cavernous and supraclinoid segment of the internal carotid artery bilaterally with ectasia of cavernous segment bilaterally. Fetal contribution to the right posterior cerebral artery. Mild narrowing A1 segment anterior cerebral artery bilaterally. Moderate narrowing A2 segment anterior cerebral artery bilaterally. Moderate to marked narrowing middle cerebral artery branches bilaterally. Marked narrowing distal left vertebral artery. Moderate to marked narrowing distal right vertebral artery. Mild narrowing basilar artery. Nonvisualized posterior inferior cerebellar arteries, anterior inferior cerebellar arteries and superior cerebellar arteries. Mark narrowing posterior cerebral arteries bilaterally more notable on the left. Prominent basilar tip may reflect atherosclerotic changes rather than saccular aneurysm. MRI NECK FINDINGS Exam is motion degraded. Ectatic common carotid arteries bilaterally. Loss of signal right common carotid artery may be related to artifact rather than true stenosis. Appearance of hemodynamically significant stenosis proximal internal carotid artery bilaterally may be accentuated by motion artifact. Mild to moderate narrowing proximal 3 cm internal carotid artery bilaterally. Poor delineation majority of the left vertebral artery. Moderate to marked narrowing proximal right vertebral artery. Mild to moderate narrowing proximal right subclavian artery. Moderate to marked tandem stenosis  left subclavian artery. IMPRESSION: MRI HEAD Exam is motion degraded. Acute nonhemorrhagic infarct right thalamus and posterior limb right internal capsule. Question tiny acute medial left occipital lobe infarct. Remote bilateral cerebellar infarcts. Remote infarct right middle cerebellar peduncle. Remote thalamic infarcts. Mild small vessel disease changes. Global atrophy without hydrocephalus. No intracranial hemorrhage. No intracranial enhancing lesion. Longstanding complete opacification right sphenoid sinus without expansion. Inspissated material suspected. Mucosal thickening remainder of paranasal sinuses with opacification posterior left ethmoid sinus air cells. Elongated right globe.  Post lens replacement. MRA HEAD Exam is motion degraded. Mild narrowing pre cavernous and supraclinoid segment  of the internal carotid artery bilaterally with ectasia of cavernous segment bilaterally. Mild narrowing A1 segment anterior cerebral artery bilaterally. Moderate narrowing A2 segment anterior cerebral artery bilaterally. Moderate to marked narrowing middle cerebral artery branches bilaterally. Marked narrowing distal left vertebral artery. Moderate to marked narrowing distal right vertebral artery. Mild narrowing basilar artery. Nonvisualized posterior inferior cerebellar arteries, anterior inferior cerebellar arteries and superior cerebellar arteries. Mark narrowing posterior cerebral arteries bilaterally more notable on the left. Prominent basilar tip may reflect atherosclerotic changes rather than saccular aneurysm. MRI NECK Exam is motion degraded. Ectatic common carotid arteries bilaterally. Loss of signal right common carotid artery may be related to artifact rather than true stenosis. Appearance of hemodynamically significant stenosis proximal internal carotid artery bilaterally may be accentuated by motion artifact. Mild to moderate narrowing proximal 3 cm internal carotid artery bilaterally. Poor delineation  majority of the left vertebral artery. Moderate to marked narrowing proximal right vertebral artery. Mild to moderate narrowing proximal right subclavian artery. Moderate to marked tandem stenosis left subclavian artery. Electronically Signed   By: Genia Del M.D.   On: 09/11/2015 10:33   Mr Angiogram Neck W Wo Contrast  09/11/2015  CLINICAL DATA:  82 year old female with slurred speech and left-sided weakness with left facial droop. History of stroke. Subsequent encounter. EXAM: MRI HEAD WITHOUT AND WITH CONTRAST AND MRA HEAD WITHOUT CONTRAST AND MRI NECK WITHOUT AND WITH CONTRAST TECHNIQUE: Multiplanar, multiecho pulse sequences of the brain and surrounding structures were obtained without and with intravenous contrast. Angiographic images of the head were obtained using MRA technique without contrast. Multiplanar, multiecho pulse sequences of the neck and surrounding structures were obtained without and with intravenous contrast. CONTRAST:  46mL MULTIHANCE GADOBENATE DIMEGLUMINE 529 MG/ML IV SOLN COMPARISON:  09/11/2015 head CT.  08/02/2015 brain MR. FINDINGS: MRI HEAD FINDINGS Exam is motion degraded. Acute nonhemorrhagic infarct right thalamus and posterior limb right internal capsule. Question tiny acute medial left occipital lobe infarct. Remote bilateral cerebellar infarcts. Remote infarct right middle cerebellar peduncle. Remote thalamic infarcts. Mild small vessel disease changes. Global atrophy without hydrocephalus. No intracranial hemorrhage. No intracranial enhancing lesion. Longstanding complete opacification right sphenoid sinus without expansion. Inspissated material suspected. Mucosal thickening remainder of paranasal sinuses with opacification posterior left ethmoid sinus air cells. Elongated right globe.  Post lens replacement. Partially empty sella. Mild spinal stenosis upper cervical spine. Cervical medullary junction unremarkable. MRA HEAD FINDINGS Exam is motion degraded. Mild narrowing  pre cavernous and supraclinoid segment of the internal carotid artery bilaterally with ectasia of cavernous segment bilaterally. Fetal contribution to the right posterior cerebral artery. Mild narrowing A1 segment anterior cerebral artery bilaterally. Moderate narrowing A2 segment anterior cerebral artery bilaterally. Moderate to marked narrowing middle cerebral artery branches bilaterally. Marked narrowing distal left vertebral artery. Moderate to marked narrowing distal right vertebral artery. Mild narrowing basilar artery. Nonvisualized posterior inferior cerebellar arteries, anterior inferior cerebellar arteries and superior cerebellar arteries. Mark narrowing posterior cerebral arteries bilaterally more notable on the left. Prominent basilar tip may reflect atherosclerotic changes rather than saccular aneurysm. MRI NECK FINDINGS Exam is motion degraded. Ectatic common carotid arteries bilaterally. Loss of signal right common carotid artery may be related to artifact rather than true stenosis. Appearance of hemodynamically significant stenosis proximal internal carotid artery bilaterally may be accentuated by motion artifact. Mild to moderate narrowing proximal 3 cm internal carotid artery bilaterally. Poor delineation majority of the left vertebral artery. Moderate to marked narrowing proximal right vertebral artery. Mild to moderate narrowing proximal right subclavian artery.  Moderate to marked tandem stenosis left subclavian artery. IMPRESSION: MRI HEAD Exam is motion degraded. Acute nonhemorrhagic infarct right thalamus and posterior limb right internal capsule. Question tiny acute medial left occipital lobe infarct. Remote bilateral cerebellar infarcts. Remote infarct right middle cerebellar peduncle. Remote thalamic infarcts. Mild small vessel disease changes. Global atrophy without hydrocephalus. No intracranial hemorrhage. No intracranial enhancing lesion. Longstanding complete opacification right  sphenoid sinus without expansion. Inspissated material suspected. Mucosal thickening remainder of paranasal sinuses with opacification posterior left ethmoid sinus air cells. Elongated right globe.  Post lens replacement. MRA HEAD Exam is motion degraded. Mild narrowing pre cavernous and supraclinoid segment of the internal carotid artery bilaterally with ectasia of cavernous segment bilaterally. Mild narrowing A1 segment anterior cerebral artery bilaterally. Moderate narrowing A2 segment anterior cerebral artery bilaterally. Moderate to marked narrowing middle cerebral artery branches bilaterally. Marked narrowing distal left vertebral artery. Moderate to marked narrowing distal right vertebral artery. Mild narrowing basilar artery. Nonvisualized posterior inferior cerebellar arteries, anterior inferior cerebellar arteries and superior cerebellar arteries. Mark narrowing posterior cerebral arteries bilaterally more notable on the left. Prominent basilar tip may reflect atherosclerotic changes rather than saccular aneurysm. MRI NECK Exam is motion degraded. Ectatic common carotid arteries bilaterally. Loss of signal right common carotid artery may be related to artifact rather than true stenosis. Appearance of hemodynamically significant stenosis proximal internal carotid artery bilaterally may be accentuated by motion artifact. Mild to moderate narrowing proximal 3 cm internal carotid artery bilaterally. Poor delineation majority of the left vertebral artery. Moderate to marked narrowing proximal right vertebral artery. Mild to moderate narrowing proximal right subclavian artery. Moderate to marked tandem stenosis left subclavian artery. Electronically Signed   By: Genia Del M.D.   On: 09/11/2015 10:33   Mr Jeri Cos [id number] Contrast  09/11/2015  CLINICAL DATA:  82 year old female with slurred speech and left-sided weakness with left facial droop. History of stroke. Subsequent encounter. EXAM: MRI HEAD WITHOUT AND  WITH CONTRAST AND MRA HEAD WITHOUT CONTRAST AND MRI NECK WITHOUT AND WITH CONTRAST TECHNIQUE: Multiplanar, multiecho pulse sequences of the brain and surrounding structures were obtained without and with intravenous contrast. Angiographic images of the head were obtained using MRA technique without contrast. Multiplanar, multiecho pulse sequences of the neck and surrounding structures were obtained without and with intravenous contrast. CONTRAST:  79mL MULTIHANCE GADOBENATE DIMEGLUMINE 529 MG/ML IV SOLN COMPARISON:  09/11/2015 head CT.  08/02/2015 brain MR. FINDINGS: MRI HEAD FINDINGS Exam is motion degraded. Acute nonhemorrhagic infarct right thalamus and posterior limb right internal capsule. Question tiny acute medial left occipital lobe infarct. Remote bilateral cerebellar infarcts. Remote infarct right middle cerebellar peduncle. Remote thalamic infarcts. Mild small vessel disease changes. Global atrophy without hydrocephalus. No intracranial hemorrhage. No intracranial enhancing lesion. Longstanding complete opacification right sphenoid sinus without expansion. Inspissated material suspected. Mucosal thickening remainder of paranasal sinuses with opacification posterior left ethmoid sinus air cells. Elongated right globe.  Post lens replacement. Partially empty sella. Mild spinal stenosis upper cervical spine. Cervical medullary junction unremarkable. MRA HEAD FINDINGS Exam is motion degraded. Mild narrowing pre cavernous and supraclinoid segment of the internal carotid artery bilaterally with ectasia of cavernous segment bilaterally. Fetal contribution to the right posterior cerebral artery. Mild narrowing A1 segment anterior cerebral artery bilaterally. Moderate narrowing A2 segment anterior cerebral artery bilaterally. Moderate to marked narrowing middle cerebral artery branches bilaterally. Marked narrowing distal left vertebral artery. Moderate to marked narrowing distal right vertebral artery. Mild  narrowing basilar artery. Nonvisualized posterior inferior cerebellar arteries, anterior  inferior cerebellar arteries and superior cerebellar arteries. Mark narrowing posterior cerebral arteries bilaterally more notable on the left. Prominent basilar tip may reflect atherosclerotic changes rather than saccular aneurysm. MRI NECK FINDINGS Exam is motion degraded. Ectatic common carotid arteries bilaterally. Loss of signal right common carotid artery may be related to artifact rather than true stenosis. Appearance of hemodynamically significant stenosis proximal internal carotid artery bilaterally may be accentuated by motion artifact. Mild to moderate narrowing proximal 3 cm internal carotid artery bilaterally. Poor delineation majority of the left vertebral artery. Moderate to marked narrowing proximal right vertebral artery. Mild to moderate narrowing proximal right subclavian artery. Moderate to marked tandem stenosis left subclavian artery. IMPRESSION: MRI HEAD Exam is motion degraded. Acute nonhemorrhagic infarct right thalamus and posterior limb right internal capsule. Question tiny acute medial left occipital lobe infarct. Remote bilateral cerebellar infarcts. Remote infarct right middle cerebellar peduncle. Remote thalamic infarcts. Mild small vessel disease changes. Global atrophy without hydrocephalus. No intracranial hemorrhage. No intracranial enhancing lesion. Longstanding complete opacification right sphenoid sinus without expansion. Inspissated material suspected. Mucosal thickening remainder of paranasal sinuses with opacification posterior left ethmoid sinus air cells. Elongated right globe.  Post lens replacement. MRA HEAD Exam is motion degraded. Mild narrowing pre cavernous and supraclinoid segment of the internal carotid artery bilaterally with ectasia of cavernous segment bilaterally. Mild narrowing A1 segment anterior cerebral artery bilaterally. Moderate narrowing A2 segment anterior cerebral  artery bilaterally. Moderate to marked narrowing middle cerebral artery branches bilaterally. Marked narrowing distal left vertebral artery. Moderate to marked narrowing distal right vertebral artery. Mild narrowing basilar artery. Nonvisualized posterior inferior cerebellar arteries, anterior inferior cerebellar arteries and superior cerebellar arteries. Mark narrowing posterior cerebral arteries bilaterally more notable on the left. Prominent basilar tip may reflect atherosclerotic changes rather than saccular aneurysm. MRI NECK Exam is motion degraded. Ectatic common carotid arteries bilaterally. Loss of signal right common carotid artery may be related to artifact rather than true stenosis. Appearance of hemodynamically significant stenosis proximal internal carotid artery bilaterally may be accentuated by motion artifact. Mild to moderate narrowing proximal 3 cm internal carotid artery bilaterally. Poor delineation majority of the left vertebral artery. Moderate to marked narrowing proximal right vertebral artery. Mild to moderate narrowing proximal right subclavian artery. Moderate to marked tandem stenosis left subclavian artery. Electronically Signed   By: Genia Del M.D.   On: 09/11/2015 10:33   Ct Head Code Stroke W/o Cm  09/11/2015  CLINICAL DATA:  82 year old female with slurred speech, left-sided weakness and left facial droop. History stroke. Initial encounter. EXAM: CT HEAD WITHOUT CONTRAST TECHNIQUE: Contiguous axial images were obtained from the base of the skull through the vertex without intravenous contrast. COMPARISON:  08/02/2015 brain MR and head CT. FINDINGS: No intracranial hemorrhage. Several prior remote infarcts with appearance of evolution/progression of right thalamic and posterior limb right internal capsule infarct. Moderate small vessel disease changes. Global atrophy without hydrocephalus. No intracranial mass lesion noted on this unenhanced exam. Exophthalmos. Opacification  right sphenoid sinus. Mild mucosal thickening maxillary sinuses and ethmoid sinus air cells greater on the left. IMPRESSION: No intracranial hemorrhage. Several prior remote infarcts with appearance of evolution/progression of right thalamic and posterior limb right internal capsule infarct. Moderate small vessel disease changes. Global atrophy without hydrocephalus. Opacification right sphenoid sinus. Mild mucosal thickening maxillary sinuses and ethmoid sinus air cells greater on the left. These results were called by telephone at the time of interpretation on 09/11/2015 at 6:39 am to Dr. Everlene Balls , who verbally acknowledged  these results. Electronically Signed   By: Genia Del M.D.   On: 09/11/2015 06:41    Assessment/Plan  Physical deconditioning Will have her work with physical therapy and occupational therapy team to help with gait training and muscle strengthening exercises.fall precautions. Skin care. Encourage to be out of bed.   Acute CVA  continue aspirin 81 mg daily and Plavix 75 mg daily. Has follow up with neurology. Will have her work with therapy team to train for her ADLs. Continue atorvastatin.  Left sided hemiparesis Post CVA, improving. Will have patient work with PT/OT as tolerated to regain strength and restore function.  Fall precautions are in place. Physiatry team to follow.   Right sided residual weakness Post old CVA. Monitor clinically. Denies muscle spasm  Asthma  continue Dulera twice daily, Prednisone 5 mg daily. Change albuterol MDI to qid x 5 days, then q6h prn and monitor with her wheezing  Cough Add mucinex dm 10 cc bid x 5 days and encouraged to use incentive spirometer  Hyperlipidemia continue atorvastatin 10 mg daily  Protein calorie malnutrition Continue procel supplement. Patient to be followed by dietary team  Legally blind right eye continue supportive care with fall precautions   Goals of care: short term rehabilitation   Labs/tests  ordered: cbc, bmp  Family/ staff Communication: reviewed care plan with patient and nursing supervisor    Blanchie Serve, MD Internal Medicine [place], [place] [address] Cell Phone (Monday-Friday 8 am - 5 pm): [phone] On Call: [phone] and follow prompts after 5 pm and on weekends Office Phone: [phone] Office Fax: [phone]

## 2015-10-01 DIAGNOSIS — R2681 Unsteadiness on feet: Secondary | ICD-10-CM | POA: Diagnosis not present

## 2015-10-01 DIAGNOSIS — R5381 Other malaise: Secondary | ICD-10-CM | POA: Diagnosis not present

## 2015-10-01 DIAGNOSIS — Z5189 Encounter for other specified aftercare: Secondary | ICD-10-CM | POA: Diagnosis not present

## 2015-10-01 DIAGNOSIS — I6931 Attention and concentration deficit following cerebral infarction: Secondary | ICD-10-CM | POA: Diagnosis not present

## 2015-10-01 DIAGNOSIS — M7501 Adhesive capsulitis of right shoulder: Secondary | ICD-10-CM | POA: Diagnosis not present

## 2015-10-01 DIAGNOSIS — H5441 Blindness, right eye, normal vision left eye: Secondary | ICD-10-CM | POA: Diagnosis not present

## 2015-10-03 DIAGNOSIS — R2681 Unsteadiness on feet: Secondary | ICD-10-CM | POA: Diagnosis not present

## 2015-10-03 DIAGNOSIS — H5441 Blindness, right eye, normal vision left eye: Secondary | ICD-10-CM | POA: Diagnosis not present

## 2015-10-03 DIAGNOSIS — R5381 Other malaise: Secondary | ICD-10-CM | POA: Diagnosis not present

## 2015-10-03 DIAGNOSIS — I6931 Attention and concentration deficit following cerebral infarction: Secondary | ICD-10-CM | POA: Diagnosis not present

## 2015-10-03 DIAGNOSIS — M7501 Adhesive capsulitis of right shoulder: Secondary | ICD-10-CM | POA: Diagnosis not present

## 2015-10-03 DIAGNOSIS — Z5189 Encounter for other specified aftercare: Secondary | ICD-10-CM | POA: Diagnosis not present

## 2015-10-04 ENCOUNTER — Non-Acute Institutional Stay (SKILLED_NURSING_FACILITY): Payer: Medicare Other | Admitting: Adult Health

## 2015-10-04 ENCOUNTER — Encounter: Payer: Self-pay | Admitting: Adult Health

## 2015-10-04 DIAGNOSIS — J4540 Moderate persistent asthma, uncomplicated: Secondary | ICD-10-CM | POA: Diagnosis not present

## 2015-10-04 LAB — CBC AND DIFFERENTIAL
HCT: 43 % (ref 36–46)
HEMOGLOBIN: 14.6 g/dL (ref 12.0–16.0)
Platelets: 235 10*3/uL (ref 150–399)
WBC: 9.3 10^3/mL

## 2015-10-04 LAB — BASIC METABOLIC PANEL
BUN: 17 mg/dL (ref 4–21)
CREATININE: 1 mg/dL (ref 0.5–1.1)
Glucose: 98 mg/dL
POTASSIUM: 4.1 mmol/L (ref 3.4–5.3)
Sodium: 138 mmol/L (ref 137–147)

## 2015-10-04 NOTE — Progress Notes (Signed)
Patient ID: Darlene Murray, female   DOB: 1922-04-11, 82 y.o.   MRN: [id number]    DATE:    10/04/15  MRN:  [id number]  BIRTHDAY: Feb 07, 1922  Facility:  Nursing Home Location:  [place] Room Number: [id number]  LEVEL OF CARE:  SNF (31)  Contact Information    Name Relation Home Work Mobile   [place] Spouse [phone]  [phone]   [name],[name] Son [phone]         Code Status History    Date Active Date Inactive Code Status Order ID Comments User Context   09/13/2015  4:57 PM 09/13/2015  4:57 PM Full Code [id number]  Cathlyn Parsons, PA-C Inpatient   09/13/2015  4:57 PM 09/25/2015  4:03 PM Full Code [id number]  Cathlyn Parsons, PA-C Inpatient   09/11/2015  8:52 AM 09/13/2015  4:57 PM Full Code [id number]  [name], MD ED   08/02/2015  8:13 AM 08/03/2015  5:19 PM Full Code [id number]  Samella Parr, NP Inpatient   04/23/2015  5:42 PM 04/27/2015  4:30 PM Full Code [id number]  Norval Morton, MD Inpatient   04/10/2015  2:44 PM 04/20/2015  1:25 PM Full Code [id number]  Cathlyn Parsons, PA-C Inpatient   04/06/2015  4:49 PM 04/10/2015  2:44 PM Full Code [id number]  Jonetta Osgood, MD Inpatient   04/06/2015  4:29 AM 04/06/2015  4:49 PM Full Code [id number]  Lavina Hamman, MD ED       Chief Complaint  Patient presents with  . Acute Visit    Asthma    HISTORY OF PRESENT ILLNESS:  This is a 82 year old female who was noted to have SOB and wheezing. Chest x-ray is negative for pneumonia. Husband reported that whenever patient has wheezing and has SOB, she gets Prednisone tapering dose.  has been admitted to [facility] on3/21/17 from [place].She has PMH of asthma, and chronic kidney disease stage 3, CVA with right residual weakness,  UTI, hyperlipidemia, herniated nucleus pulposus, lumbar and received inpatient rehab services in October 2016. She was then able to ambulate short distances with a walker. She recently presented to  the hospital, early this month, with left-sided weakness and slurred speech. MRI of the brain showed acute nonhemorrhagic infarct, right thalamus and posterior limb, right internal capsule, tiny acute medial left occipital lobe infarct and remote bilateral cerebellar infarcts. MRA of the head and neck showed moderate to marked narrowing proximal right vertebral artery. Echocardiogram with ejection fraction of 123456, grade 1 diastolic dysfunction. She did not receive tPA. She was maintained on aspirin and Plavix therapy. She then had acute rehabilitation.  PAST MEDICAL HISTORY:  Past Medical History  Diagnosis Date  . Blindness     Legally blind, right eye  . Asthmatic bronchitis   . Asthma     Moderate, persistent  . Fibrocystic breast disease   . Allergic rhinitis   . Hyperlipidemia   . Venous insufficiency     lower extremities  . HNP (herniated nucleus pulposus), lumbar     L4-L5  . Left ventricular hypertrophy 2008    mild  . Antibiotic-associated diarrhea   . Stroke (cerebrum) ([place])   . Physical deconditioning   . Right-sided lacunar infarction ([place])   . Acute left hemiparesis ([place])   . Right sided weakness   . Cough   . Protein calorie malnutrition ([place])      CURRENT MEDICATIONS: Reviewed  Patient's Medications  New Prescriptions   No medications on file  Previous Medications   ALBUTEROL (PROVENTIL HFA;VENTOLIN HFA) 108 (90 BASE) MCG/ACT INHALER    Inhale 2 puffs into the lungs every 6 (six) hours as needed for wheezing or shortness of breath.    ASPIRIN 81 MG CHEWABLE TABLET    Chew 81 mg by mouth daily.   ATORVASTATIN (LIPITOR) 10 MG TABLET    Take 10 mg by mouth daily.   CLOPIDOGREL (PLAVIX) 75 MG TABLET    Take 1 tablet (75 mg total) by mouth daily.   MOMETASONE-FORMOTEROL (DULERA) 100-5 MCG/ACT AERO    Inhale 2 puffs into the lungs 2 (two) times daily.   PREDNISONE (DELTASONE) 5 MG TABLET    Take 5 mg by mouth daily with breakfast. Reported on 10/05/2015   PROTEIN  (PROCEL 100 PO)    Take 2 scoop by mouth 2 (two) times daily.          PREDNISONE (DELTASONE) 10 MG TABLET    Take 10 mg by mouth daily with breakfast. Take 4 tabs po day 1 and 2.  Take 3 tabs day 3 and 4.  Take 2 tabs po day 5 and 6.  Take 1 tab day 7 and 8.        No Known Allergies   REVIEW OF SYSTEMS:  GENERAL: no change in appetite, no fatigue, no weight changes, no fever, chills or weakness EYES: Legally blind right eye EARS: Denies change in hearing, ringing in ears, or earache NOSE: Denies nasal congestion or epistaxis MOUTH and THROAT: Denies oral discomfort, gingival pain or bleeding, pain from teeth or hoarseness   RESPIRATORY: no cough, SOB, DOE, wheezing, hemoptysis CARDIAC: no chest pain, edema or palpitations GI: no abdominal pain, diarrhea, constipation, heart burn, nausea or vomiting GU: Denies dysuria, frequency, hematuria, incontinence, or discharge PSYCHIATRIC: Denies feeling of depression or anxiety. No report of hallucinations, insomnia, paranoia, or agitation   PHYSICAL EXAMINATION  GENERAL APPEARANCE: Well nourished. In no acute distress. Normal body habitus SKIN:  Skin is warm and dry.  HEAD: Normal in size and contour. No evidence of trauma EYES: Lids open and close normally. No blepharitis, entropion or ectropion. PERRL. Conjunctivae are clear and sclerae are white. Lenses are without opacity EARS: Pinnae are normal. Patient hears normal voice tunes of the examiner MOUTH and THROAT: Lips are without lesions. Oral mucosa is moist and without lesions. Tongue is normal in shape, size, and color and without lesions NECK: supple, trachea midline, no neck masses, no thyroid tenderness, no thyromegaly LYMPHATICS: no LAN in the neck, no supraclavicular LAN RESPIRATORY: wheezing on bilateral lung fields CARDIAC: RRR, no murmur,no extra heart sounds, no edema GI: abdomen soft, normal BS, no masses, no tenderness, no hepatomegaly, no splenomegaly EXTREMITIES:   RUE and RLE 4/5 strength; LUE and LLE 5/5 strength; right shoulder has limited ROM PSYCHIATRIC: Alert to self.  Affect and behavior are appropriate  LABS/RADIOLOGY: Labs reviewed: Basic Metabolic Panel:  Recent Labs  09/11/15 0544 09/11/15 0552  09/14/15 0440  09/21/15 0537 09/27/15 10/04/15  NA 143 143  --  144  < > 143 145 138  K 4.1 4.1  --  3.8  --  3.8 4.1 4.1  CL 108 104  --  109  --  109  --   --   CO2 27  --   --  25  --  27  --   --   GLUCOSE 80 75  --  76  --  81  --   --   BUN 16 21*  --  13  < > 13 20 17   CREATININE 1.11* 1.00  < > 1.05*  < > 1.00 1.0 1.0  CALCIUM 8.8*  --   --  8.5*  --  8.8*  --   --   < > = values in this interval not displayed. Liver Function Tests:  Recent Labs  08/02/15 0513 09/11/15 0544 09/14/15 0440  AST 21 20 18   ALT 18 17 15   ALKPHOS 42 43 44  BILITOT 0.6 0.7 0.6  PROT 5.9* 5.8* 5.3*  ALBUMIN 3.1* 3.0* 2.7*    Recent Labs  04/05/15 2252  LIPASE 23    CBC:  Recent Labs  09/13/15 1709 09/14/15 0440  09/21/15 0537 09/27/15 10/04/15  WBC 6.9 7.1  < > 7.4 9.3 9.3  NEUTROABS  --  3.3  --  3.3 4  --   HGB 13.5 13.1  --  12.5 12.9 14.6  HCT 41.7 40.6  --  39.7 40 43  MCV 88.2 87.9  --  87.8  --   --   PLT 229 233  --  236 258 235  < > = values in this interval not displayed.  Lipid Panel:  Recent Labs  04/07/15 0622 08/03/15 0230 09/12/15 0340  HDL 48 47 39*   Cardiac Enzymes:  Recent Labs  12/16/14 1025 04/23/15 1856  TROPONINI <0.03 <0.03    CBG:  Recent Labs  04/10/15 1700 08/02/15 0524 09/11/15 0544  GLUCAP 112* 80 78      ASSESSMENT/PLAN:  Asthma - hold Prednisone 5 mg daily and start Prednisone 10 mg  tapering dose; continue Dulera 100-5 mcg 2 puffs twice daily,  albuterol HFA 108 (90 base) mcg/act inhaler 2 puffs into the lungs every 6 hours when necessary     [facility], NP [place]

## 2015-10-05 ENCOUNTER — Encounter: Payer: Self-pay | Admitting: Physical Medicine & Rehabilitation

## 2015-10-05 ENCOUNTER — Encounter: Payer: No Typology Code available for payment source | Admitting: Physical Medicine & Rehabilitation

## 2015-10-05 ENCOUNTER — Encounter
Payer: No Typology Code available for payment source | Attending: Physical Medicine & Rehabilitation | Admitting: Physical Medicine & Rehabilitation

## 2015-10-05 VITALS — BP 129/55 | HR 79

## 2015-10-05 DIAGNOSIS — H5441 Blindness, right eye, normal vision left eye: Secondary | ICD-10-CM | POA: Diagnosis not present

## 2015-10-05 DIAGNOSIS — R5381 Other malaise: Secondary | ICD-10-CM | POA: Diagnosis not present

## 2015-10-05 DIAGNOSIS — I6381 Other cerebral infarction due to occlusion or stenosis of small artery: Secondary | ICD-10-CM

## 2015-10-05 DIAGNOSIS — M25511 Pain in right shoulder: Secondary | ICD-10-CM

## 2015-10-05 DIAGNOSIS — E785 Hyperlipidemia, unspecified: Secondary | ICD-10-CM | POA: Insufficient documentation

## 2015-10-05 DIAGNOSIS — I69398 Other sequelae of cerebral infarction: Secondary | ICD-10-CM | POA: Insufficient documentation

## 2015-10-05 DIAGNOSIS — I6931 Attention and concentration deficit following cerebral infarction: Secondary | ICD-10-CM | POA: Diagnosis not present

## 2015-10-05 DIAGNOSIS — R270 Ataxia, unspecified: Secondary | ICD-10-CM | POA: Diagnosis not present

## 2015-10-05 DIAGNOSIS — Z5189 Encounter for other specified aftercare: Secondary | ICD-10-CM | POA: Diagnosis not present

## 2015-10-05 DIAGNOSIS — I69993 Ataxia following unspecified cerebrovascular disease: Secondary | ICD-10-CM

## 2015-10-05 DIAGNOSIS — J4550 Severe persistent asthma, uncomplicated: Secondary | ICD-10-CM

## 2015-10-05 DIAGNOSIS — I639 Cerebral infarction, unspecified: Secondary | ICD-10-CM | POA: Diagnosis not present

## 2015-10-05 DIAGNOSIS — R269 Unspecified abnormalities of gait and mobility: Secondary | ICD-10-CM

## 2015-10-05 DIAGNOSIS — M5126 Other intervertebral disc displacement, lumbar region: Secondary | ICD-10-CM | POA: Insufficient documentation

## 2015-10-05 DIAGNOSIS — I872 Venous insufficiency (chronic) (peripheral): Secondary | ICD-10-CM | POA: Insufficient documentation

## 2015-10-05 DIAGNOSIS — R2681 Unsteadiness on feet: Secondary | ICD-10-CM | POA: Diagnosis not present

## 2015-10-05 DIAGNOSIS — I517 Cardiomegaly: Secondary | ICD-10-CM | POA: Insufficient documentation

## 2015-10-05 DIAGNOSIS — M7501 Adhesive capsulitis of right shoulder: Secondary | ICD-10-CM | POA: Diagnosis not present

## 2015-10-05 DIAGNOSIS — J45909 Unspecified asthma, uncomplicated: Secondary | ICD-10-CM | POA: Insufficient documentation

## 2015-10-05 DIAGNOSIS — J4540 Moderate persistent asthma, uncomplicated: Secondary | ICD-10-CM

## 2015-10-05 NOTE — Progress Notes (Addendum)
Subjective:    Patient ID: Darlene Murray, female    DOB: 1922-01-26, 82 y.o.   MRN: [id number]  HPI 82 year old right-handed female with history of asthma, maintained on prednisone; CVA; right-sided residual weakness who presents for transitional care management of right posterior limb, left occipital, and right thalamic CVA after receiving CIR.   She was discharged SNF.  Pt presents with her husband.  She has been doing well.  Over the last few days her asthma (started on a taper) has aggravated her a little.  She is anticipated for discharge from SNF next week.  She denies falls. DATE OF ADMISSION:  09/13/2015 DATE OF DISCHARGE:  09/25/2015.   DME: Provided by SNF, has at home from previous stroke Mobility: [place] Rile short distances, otherwise wheelchair. Therapies: At SNF.  Pain Inventory Average Pain 0 Pain Right Now 0 My pain is no pain  In the last 24 hours, has pain interfered with the following? General activity 0 Relation with others 0 Enjoyment of life 0 What TIME of day is your pain at its worst? no pain Sleep (in general) Good  Pain is worse with: no pain Pain improves with: no pain Relief from Meds: no pain  Mobility use a walker use a wheelchair needs help with transfers  [facility] with walker with heavy assist  Function I need assistance with the following:  dressing, bathing, toileting, meal prep, household duties and shopping  Neuro/Psych weakness trouble walking  Prior Studies Any changes since last visit?  yes x-rays  Physicians involved in your care Any changes since last visit?  no   Family History  Problem Relation Age of Onset  . Heart attack Sister   . Alzheimer's disease Sister   . Asthma Son    Social History   Social History  . Marital Status: Married    Spouse Name: N/A  . Number of Children: 3  . Years of Education: College   Occupational History  . Retired     Pharmacist, hospital   Social History Main Topics  . Smoking status: Passive  Smoke Exposure - Never Smoker  . Smokeless tobacco: Never Used     Comment: Prior exposure through husband.   . Alcohol Use: No  . Drug Use: No  . Sexual Activity:    Partners: Female   Other Topics Concern  . None   Social History Narrative   Lives at home with her husband.   Right-handed.   1 cup coffee per day.      [facility]:   Originally from [facility]. She has previously lived in [place]. Previously traveled to [place]. No pets currently. No prior bird exposure. Previously worked as a Education officer, museum. No mold or hot tub exposure.    Past Surgical History  Procedure Laterality Date  . Cataract extraction    . Colonoscopy     Past Medical History  Diagnosis Date  . Blindness     Legally blind, right eye  . Asthmatic bronchitis   . Asthma   . Fibrocystic breast disease   . Allergic rhinitis   . Hyperlipidemia   . Venous insufficiency     lower extremities  . HNP (herniated nucleus pulposus), lumbar     L4-L5  . Left ventricular hypertrophy 2008    mild  . Antibiotic-associated diarrhea   . Stroke (cerebrum) ([place])   . Physical deconditioning   . Right-sided lacunar infarction ([place])   . Acute left hemiparesis ([place])   . Right sided weakness   .  Cough   . Protein calorie malnutrition ([facility])    BP 129/55 mmHg  Pulse 79  SpO2 93%  Opioid Risk Score:   Fall Risk Score:  `1  Depression screen PHQ 2/9  Depression screen [facility] 2/9 10/05/2015 05/17/2015  Decreased Interest 0 0  Down, Depressed, Hopeless 0 0  PHQ - 2 Score 0 0  Altered sleeping - 0  Tired, decreased energy - 0  Change in appetite - 0  Feeling bad or failure about yourself  - 0  Trouble concentrating - 0  Moving slowly or fidgety/restless - 0  Suicidal thoughts - 0  PHQ-9 Score - 0  Difficult doing work/chores - Not difficult at all   Review of Systems  Respiratory: Positive for wheezing.        Asthma flare/on prednisone taper  Musculoskeletal: Positive for arthralgias.  All other  systems reviewed and are negative.     Objective:   Physical Exam Constitutional: She appears well-developed and well-nourished. NAD.   HENT:  Normocephalic and atraumatic.   Eyes: Conjunctivae and EOM are normal. No scleral icterus.  Cardiovascular: Normal rate and regular rhythm.    Respiratory: Effort normal.  Expiratory wheezes. No respiratory distress.   GI: Soft. Bowel sounds are normal. She exhibits no distension.  Musculoskeletal: She exhibits no edema or tenderness.  Neurological:   She is alert and oriented x3  Motor: LUE/LLE: 5/5 RUE: 4+5 shoulder abduction, elbow flexion/extensionm, 4+/5 hand grip. Ataxia.  RLE: 4+/5 hip flexion, 4+/5 knee extension, 5/5 ankle dorsi/plantarflexion  No increase in tone noted. Skin: Skin is warm and dry.  Psychiatric: She has a normal mood and affect. Her behavior is normal. Thought content normal     Assessment & Plan:  82 year old right-handed female with history of asthma, maintained on prednisone, CVA; right-sided residual weakness, presents for transitional care management after receiving CIR for Right posterior limb, left occipital, and right thalamic stroke.  1. Right posterior limb and left occipital right thalamic stroke  Cont meds  Follow up with neurology, pt needs to reschedule  Cont SNF, followed by [facility]  2. Asthma  Cont prednisone taper  3. Hyperlipidemia.  Cont meds  4. Abnormality of gait - post stroke  Cont wheelchair for safety, advance to walker when appropriate  Denies falls  5. Right shoulder pain  Follow up with Dr. Letta Pate (pervious injection appears to be wearing off)  Referrals Reviewed: Pt and spouse encouraged to follow up with Neurology Medications reviewed: No refills needed. All questions answered.

## 2015-10-08 DIAGNOSIS — R2681 Unsteadiness on feet: Secondary | ICD-10-CM | POA: Diagnosis not present

## 2015-10-08 DIAGNOSIS — Z5189 Encounter for other specified aftercare: Secondary | ICD-10-CM | POA: Diagnosis not present

## 2015-10-08 DIAGNOSIS — I6931 Attention and concentration deficit following cerebral infarction: Secondary | ICD-10-CM | POA: Diagnosis not present

## 2015-10-08 DIAGNOSIS — H5441 Blindness, right eye, normal vision left eye: Secondary | ICD-10-CM | POA: Diagnosis not present

## 2015-10-08 DIAGNOSIS — R5381 Other malaise: Secondary | ICD-10-CM | POA: Diagnosis not present

## 2015-10-08 DIAGNOSIS — M7501 Adhesive capsulitis of right shoulder: Secondary | ICD-10-CM | POA: Diagnosis not present

## 2015-10-09 DIAGNOSIS — R2681 Unsteadiness on feet: Secondary | ICD-10-CM | POA: Diagnosis not present

## 2015-10-09 DIAGNOSIS — R5381 Other malaise: Secondary | ICD-10-CM | POA: Diagnosis not present

## 2015-10-09 DIAGNOSIS — M7501 Adhesive capsulitis of right shoulder: Secondary | ICD-10-CM | POA: Diagnosis not present

## 2015-10-09 DIAGNOSIS — H5441 Blindness, right eye, normal vision left eye: Secondary | ICD-10-CM | POA: Diagnosis not present

## 2015-10-09 DIAGNOSIS — Z5189 Encounter for other specified aftercare: Secondary | ICD-10-CM | POA: Diagnosis not present

## 2015-10-09 DIAGNOSIS — I6931 Attention and concentration deficit following cerebral infarction: Secondary | ICD-10-CM | POA: Diagnosis not present

## 2015-10-10 DIAGNOSIS — R5381 Other malaise: Secondary | ICD-10-CM | POA: Diagnosis not present

## 2015-10-10 DIAGNOSIS — Z5189 Encounter for other specified aftercare: Secondary | ICD-10-CM | POA: Diagnosis not present

## 2015-10-10 DIAGNOSIS — R2681 Unsteadiness on feet: Secondary | ICD-10-CM | POA: Diagnosis not present

## 2015-10-10 DIAGNOSIS — I6931 Attention and concentration deficit following cerebral infarction: Secondary | ICD-10-CM | POA: Diagnosis not present

## 2015-10-10 DIAGNOSIS — M7501 Adhesive capsulitis of right shoulder: Secondary | ICD-10-CM | POA: Diagnosis not present

## 2015-10-10 DIAGNOSIS — H5441 Blindness, right eye, normal vision left eye: Secondary | ICD-10-CM | POA: Diagnosis not present

## 2015-10-12 ENCOUNTER — Non-Acute Institutional Stay (SKILLED_NURSING_FACILITY): Payer: Medicare Other | Admitting: Adult Health

## 2015-10-12 ENCOUNTER — Encounter: Payer: Self-pay | Admitting: Adult Health

## 2015-10-12 DIAGNOSIS — H548 Legal blindness, as defined in USA: Secondary | ICD-10-CM

## 2015-10-12 DIAGNOSIS — E46 Unspecified protein-calorie malnutrition: Secondary | ICD-10-CM

## 2015-10-12 DIAGNOSIS — M6289 Other specified disorders of muscle: Secondary | ICD-10-CM

## 2015-10-12 DIAGNOSIS — E785 Hyperlipidemia, unspecified: Secondary | ICD-10-CM | POA: Diagnosis not present

## 2015-10-12 DIAGNOSIS — I639 Cerebral infarction, unspecified: Secondary | ICD-10-CM

## 2015-10-12 DIAGNOSIS — I6381 Other cerebral infarction due to occlusion or stenosis of small artery: Secondary | ICD-10-CM

## 2015-10-12 DIAGNOSIS — R531 Weakness: Secondary | ICD-10-CM

## 2015-10-12 DIAGNOSIS — J4540 Moderate persistent asthma, uncomplicated: Secondary | ICD-10-CM | POA: Diagnosis not present

## 2015-10-12 NOTE — Progress Notes (Signed)
Patient ID: Darlene Murray, female   DOB: 06-09-1922, 82 y.o.   MRN: [id number]    DATE:    10/12/15  MRN:  [id number]  BIRTHDAY: [birth year]  Facility:  Nursing Home Location:  [place] Room Number: [id number]  LEVEL OF CARE:  SNF (31)  Contact Information    Name Relation Home Work Mobile   [place] Spouse [phone]  [phone]   [name],[name] Son [phone]         Code Status History    Date Active Date Inactive Code Status Order ID Comments User Context   09/13/2015  4:57 PM 09/13/2015  4:57 PM Full Code [id number]  Cathlyn Parsons, PA-C Inpatient   09/13/2015  4:57 PM 09/25/2015  4:03 PM Full Code [id number]  Cathlyn Parsons, PA-C Inpatient   09/11/2015  8:52 AM 09/13/2015  4:57 PM Full Code [id number]  [name], MD ED   08/02/2015  8:13 AM 08/03/2015  5:19 PM Full Code [id number]  Samella Parr, NP Inpatient   04/23/2015  5:42 PM 04/27/2015  4:30 PM Full Code [id number]  Norval Morton, MD Inpatient   04/10/2015  2:44 PM 04/20/2015  1:25 PM Full Code [id number]  Cathlyn Parsons, PA-C Inpatient   04/06/2015  4:49 PM 04/10/2015  2:44 PM Full Code [id number]  Jonetta Osgood, MD Inpatient   04/06/2015  4:29 AM 04/06/2015  4:49 PM Full Code [id number]  Lavina Hamman, MD ED       Chief Complaint  Patient presents with  . Discharge Note    HISTORY OF PRESENT ILLNESS:  This is a 82 year old female who is for discharge home with Home health PT for endurance, OT for ADLs, CNA for showers and ST for cognition.   She was recently treated for acute asthma with Prednisone taper.  She has been admitted to [facility] on3/21/17 from [place].She has PMH of asthma, and chronic kidney disease stage 3, CVA with right residual weakness,  UTI, hyperlipidemia, herniated nucleus pulposus, lumbar and received inpatient rehab services in October 2016. She was then able to ambulate short distances with a walker. She recently presented to the  hospital, early this month, with left-sided weakness and slurred speech. MRI of the brain showed acute nonhemorrhagic infarct, right thalamus and posterior limb, right internal capsule, tiny acute medial left occipital lobe infarct and remote bilateral cerebellar infarcts. MRA of the head and neck showed moderate to marked narrowing proximal right vertebral artery. Echocardiogram with ejection fraction of 123456, grade 1 diastolic dysfunction. She did not receive tPA. She was maintained on aspirin and Plavix therapy. She then had acute rehabilitation.  Patient was admitted to this facility for short-term rehabilitation after the patient's recent hospitalization.  Patient has completed SNF rehabilitation and therapy has cleared the patient for discharge.  PAST MEDICAL HISTORY:  Past Medical History  Diagnosis Date  . Blindness     Legally blind, right eye  . Asthmatic bronchitis   . Asthma     Moderate, persistent  . Fibrocystic breast disease   . Allergic rhinitis   . Hyperlipidemia   . Venous insufficiency     lower extremities  . HNP (herniated nucleus pulposus), lumbar     L4-L5  . Left ventricular hypertrophy 2008    mild  . Antibiotic-associated diarrhea   . Stroke (cerebrum) ([place])   . Physical deconditioning   . Right-sided lacunar infarction ([place])   . Acute left hemiparesis (  [place])   . Right sided weakness   . Cough   . Protein calorie malnutrition ([place])      CURRENT MEDICATIONS: Reviewed  Patient's Medications  New Prescriptions   No medications on file  Previous Medications   ALBUTEROL (PROVENTIL HFA;VENTOLIN HFA) 108 (90 BASE) MCG/ACT INHALER    Inhale 2 puffs into the lungs every 6 (six) hours as needed for wheezing or shortness of breath.    ASPIRIN 81 MG CHEWABLE TABLET    Chew 81 mg by mouth daily.   ATORVASTATIN (LIPITOR) 10 MG TABLET    Take 10 mg by mouth daily.   CLOPIDOGREL (PLAVIX) 75 MG TABLET    Take 1 tablet (75 mg total) by mouth daily.    MOMETASONE-FORMOTEROL (DULERA) 100-5 MCG/ACT AERO    Inhale 2 puffs into the lungs 2 (two) times daily.   PREDNISONE (DELTASONE) 5 MG TABLET    Take 5 mg by mouth daily with breakfast. Reported on 10/05/2015   PROTEIN (PROCEL 100 PO)    Take 2 scoop by mouth 2 (two) times daily.  Modified Medications   No medications on file  Discontinued Medications   PREDNISONE (DELTASONE) 10 MG TABLET    Take 10 mg by mouth daily with breakfast. Take 4 tabs po day 1 and 2.  Take 3 tabs day 3 and 4.  Take 2 tabs po day 5 and 6.  Take 1 tab day 7 and 8.     No Known Allergies   REVIEW OF SYSTEMS:  GENERAL: no change in appetite, no fatigue, no weight changes, no fever, chills or weakness EYES: Legally blind right eye EARS: Denies change in hearing, ringing in ears, or earache NOSE: Denies nasal congestion or epistaxis MOUTH and THROAT: Denies oral discomfort, gingival pain or bleeding, pain from teeth or hoarseness   RESPIRATORY: no cough, SOB, DOE, wheezing, hemoptysis CARDIAC: no chest pain, edema or palpitations GI: no abdominal pain, diarrhea, constipation, heart burn, nausea or vomiting GU: Denies dysuria, frequency, hematuria, incontinence, or discharge PSYCHIATRIC: Denies feeling of depression or anxiety. No report of hallucinations, insomnia, paranoia, or agitation   PHYSICAL EXAMINATION  GENERAL APPEARANCE: Well nourished. In no acute distress. Normal body habitus SKIN:  Skin is warm and dry.  HEAD: Normal in size and contour. No evidence of trauma EYES: Lids open and close normally. No blepharitis, entropion or ectropion. PERRL. Conjunctivae are clear and sclerae are white. Lenses are without opacity EARS: Pinnae are normal. Patient hears normal voice tunes of the examiner MOUTH and THROAT: Lips are without lesions. Oral mucosa is moist and without lesions. Tongue is normal in shape, size, and color and without lesions NECK: supple, trachea midline, no neck masses, no thyroid tenderness,  no thyromegaly LYMPHATICS: no LAN in the neck, no supraclavicular LAN RESPIRATORY: breathing is even & unlabored, BS CTAB CARDIAC: RRR, no murmur,no extra heart sounds, RLE edema 1+ GI: abdomen soft, normal BS, no masses, no tenderness, no hepatomegaly, no splenomegaly EXTREMITIES:  RUE and RLE 4/5 strength; LUE and LLE 5/5 strength; right shoulder has limited ROM PSYCHIATRIC: Alert and oriented to person and place but confused to time. Affect and behavior are appropriate  LABS/RADIOLOGY: Labs reviewed: Basic Metabolic Panel:  Recent Labs  09/11/15 0544 09/11/15 0552  09/14/15 0440  09/21/15 0537 09/27/15 10/04/15  NA 143 143  --  144  < > 143 145 138  K 4.1 4.1  --  3.8  --  3.8 4.1 4.1  CL 108 104  --  109  --  109  --   --   CO2 27  --   --  25  --  27  --   --   GLUCOSE 80 75  --  76  --  81  --   --   BUN 16 21*  --  13  < > 13 20 17   CREATININE 1.11* 1.00  < > 1.05*  < > 1.00 1.0 1.0  CALCIUM 8.8*  --   --  8.5*  --  8.8*  --   --   < > = values in this interval not displayed. Liver Function Tests:  Recent Labs  08/02/15 0513 09/11/15 0544 09/14/15 0440  AST 21 20 18   ALT 18 17 15   ALKPHOS 42 43 44  BILITOT 0.6 0.7 0.6  PROT 5.9* 5.8* 5.3*  ALBUMIN 3.1* 3.0* 2.7*    Recent Labs  04/05/15 2252  LIPASE 23    CBC:  Recent Labs  09/13/15 1709 09/14/15 0440  09/21/15 0537 09/27/15 10/04/15  WBC 6.9 7.1  < > 7.4 9.3 9.3  NEUTROABS  --  3.3  --  3.3 4  --   HGB 13.5 13.1  --  12.5 12.9 14.6  HCT 41.7 40.6  --  39.7 40 43  MCV 88.2 87.9  --  87.8  --   --   PLT 229 233  --  236 258 235  < > = values in this interval not displayed.  Lipid Panel:  Recent Labs  04/07/15 0622 08/03/15 0230 09/12/15 0340  HDL 48 47 39*   Cardiac Enzymes:  Recent Labs  12/16/14 1025 04/23/15 1856  TROPONINI <0.03 <0.03    CBG:  Recent Labs  04/10/15 1700 08/02/15 0524 09/11/15 0544  GLUCAP 112* 80 78      No results found.  ASSESSMENT/PLAN:  CVA  - for Home health PT for endurance, OT for ADLs, CNA for showers and ST for cognition; continue aspirin 81 mg by mouth daily and Plavix 75 mg by mouth daily  Asthma - stable; continue Dulera 100-5 mcg 2 puffs twice daily, Prednisone 5 mg daily and start albuterol HFA 108 (90 base) mcg/act inhaler 2 puffs into the lungs every 6 hours when necessary  Hyperlipidemia - continue Pravachol 20 mg by mouth daily; check CMP  Legally blind right eye - continue supportive care with fall precaution  Protein calorie malnutrition, severe - albumin 2.7; continue Procel 2 scoops by mouth twice a day  Right-sided weakness - post old stroke; fall precaution     I have filled out patient's discharge paperwork and written prescriptions.  Patient will receive home health PT, OT, ST and CNA.  Total discharge time: Less than 30 minutes  Discharge time involved coordination of the discharge process with Education officer, museum, nursing staff and therapy department. Medical justification for home health services verified.    [facility], NP [employer] [phone]

## 2015-10-16 DIAGNOSIS — I69354 Hemiplegia and hemiparesis following cerebral infarction affecting left non-dominant side: Secondary | ICD-10-CM | POA: Diagnosis not present

## 2015-10-16 DIAGNOSIS — N183 Chronic kidney disease, stage 3 (moderate): Secondary | ICD-10-CM | POA: Diagnosis not present

## 2015-10-16 DIAGNOSIS — I69351 Hemiplegia and hemiparesis following cerebral infarction affecting right dominant side: Secondary | ICD-10-CM | POA: Diagnosis not present

## 2015-10-16 DIAGNOSIS — J45909 Unspecified asthma, uncomplicated: Secondary | ICD-10-CM | POA: Diagnosis not present

## 2015-10-16 DIAGNOSIS — E785 Hyperlipidemia, unspecified: Secondary | ICD-10-CM | POA: Diagnosis not present

## 2015-10-16 DIAGNOSIS — R410 Disorientation, unspecified: Secondary | ICD-10-CM | POA: Diagnosis not present

## 2015-10-18 DIAGNOSIS — R410 Disorientation, unspecified: Secondary | ICD-10-CM | POA: Diagnosis not present

## 2015-10-18 DIAGNOSIS — E785 Hyperlipidemia, unspecified: Secondary | ICD-10-CM | POA: Diagnosis not present

## 2015-10-18 DIAGNOSIS — I69351 Hemiplegia and hemiparesis following cerebral infarction affecting right dominant side: Secondary | ICD-10-CM | POA: Diagnosis not present

## 2015-10-18 DIAGNOSIS — N183 Chronic kidney disease, stage 3 (moderate): Secondary | ICD-10-CM | POA: Diagnosis not present

## 2015-10-18 DIAGNOSIS — J45909 Unspecified asthma, uncomplicated: Secondary | ICD-10-CM | POA: Diagnosis not present

## 2015-10-18 DIAGNOSIS — I69354 Hemiplegia and hemiparesis following cerebral infarction affecting left non-dominant side: Secondary | ICD-10-CM | POA: Diagnosis not present

## 2015-10-19 DIAGNOSIS — Z7982 Long term (current) use of aspirin: Secondary | ICD-10-CM | POA: Diagnosis not present

## 2015-10-19 DIAGNOSIS — Z7902 Long term (current) use of antithrombotics/antiplatelets: Secondary | ICD-10-CM | POA: Diagnosis not present

## 2015-10-19 DIAGNOSIS — I69351 Hemiplegia and hemiparesis following cerebral infarction affecting right dominant side: Secondary | ICD-10-CM | POA: Diagnosis not present

## 2015-10-19 DIAGNOSIS — I10 Essential (primary) hypertension: Secondary | ICD-10-CM | POA: Diagnosis not present

## 2015-10-19 DIAGNOSIS — R4781 Slurred speech: Secondary | ICD-10-CM | POA: Diagnosis not present

## 2015-10-19 DIAGNOSIS — M6281 Muscle weakness (generalized): Secondary | ICD-10-CM | POA: Diagnosis not present

## 2015-10-19 DIAGNOSIS — M1990 Unspecified osteoarthritis, unspecified site: Secondary | ICD-10-CM | POA: Diagnosis not present

## 2015-10-19 DIAGNOSIS — R488 Other symbolic dysfunctions: Secondary | ICD-10-CM | POA: Diagnosis not present

## 2015-10-19 DIAGNOSIS — I69398 Other sequelae of cerebral infarction: Secondary | ICD-10-CM | POA: Diagnosis not present

## 2015-10-19 DIAGNOSIS — R1312 Dysphagia, oropharyngeal phase: Secondary | ICD-10-CM | POA: Diagnosis not present

## 2015-10-19 DIAGNOSIS — Z66 Do not resuscitate: Secondary | ICD-10-CM | POA: Diagnosis present

## 2015-10-19 DIAGNOSIS — I6789 Other cerebrovascular disease: Secondary | ICD-10-CM | POA: Diagnosis not present

## 2015-10-19 DIAGNOSIS — Z7952 Long term (current) use of systemic steroids: Secondary | ICD-10-CM | POA: Diagnosis not present

## 2015-10-19 DIAGNOSIS — Z5189 Encounter for other specified aftercare: Secondary | ICD-10-CM | POA: Diagnosis not present

## 2015-10-19 DIAGNOSIS — Z8673 Personal history of transient ischemic attack (TIA), and cerebral infarction without residual deficits: Secondary | ICD-10-CM | POA: Diagnosis not present

## 2015-10-19 DIAGNOSIS — J45909 Unspecified asthma, uncomplicated: Secondary | ICD-10-CM | POA: Diagnosis present

## 2015-10-19 DIAGNOSIS — I639 Cerebral infarction, unspecified: Secondary | ICD-10-CM | POA: Diagnosis not present

## 2015-10-19 DIAGNOSIS — Z7951 Long term (current) use of inhaled steroids: Secondary | ICD-10-CM | POA: Diagnosis not present

## 2015-10-19 DIAGNOSIS — I679 Cerebrovascular disease, unspecified: Secondary | ICD-10-CM | POA: Diagnosis not present

## 2015-10-19 DIAGNOSIS — R4789 Other speech disturbances: Secondary | ICD-10-CM | POA: Diagnosis not present

## 2015-10-19 DIAGNOSIS — E785 Hyperlipidemia, unspecified: Secondary | ICD-10-CM | POA: Diagnosis not present

## 2015-10-19 DIAGNOSIS — R2681 Unsteadiness on feet: Secondary | ICD-10-CM | POA: Diagnosis not present

## 2015-10-19 DIAGNOSIS — R079 Chest pain, unspecified: Secondary | ICD-10-CM | POA: Diagnosis not present

## 2015-10-19 DIAGNOSIS — N183 Chronic kidney disease, stage 3 (moderate): Secondary | ICD-10-CM | POA: Diagnosis not present

## 2015-10-19 DIAGNOSIS — G8104 Flaccid hemiplegia affecting left nondominant side: Secondary | ICD-10-CM | POA: Diagnosis not present

## 2015-10-19 DIAGNOSIS — I69354 Hemiplegia and hemiparesis following cerebral infarction affecting left non-dominant side: Secondary | ICD-10-CM | POA: Diagnosis not present

## 2015-10-19 DIAGNOSIS — Z87440 Personal history of urinary (tract) infections: Secondary | ICD-10-CM | POA: Diagnosis not present

## 2015-10-19 DIAGNOSIS — R29810 Facial weakness: Secondary | ICD-10-CM | POA: Diagnosis not present

## 2015-10-19 LAB — HEPATIC FUNCTION PANEL
ALT: 13 U/L (ref 7–35)
AST: 16 U/L (ref 13–35)
Alkaline Phosphatase: 48 U/L (ref 25–125)
Bilirubin, Total: 1 mg/dL

## 2015-10-21 LAB — CBC AND DIFFERENTIAL
HCT: 39 % (ref 36–46)
Hemoglobin: 12.7 g/dL (ref 12.0–16.0)
Platelets: 269 10*3/uL (ref 150–399)
WBC: 8.3 10*3/mL

## 2015-10-21 LAB — BASIC METABOLIC PANEL
BUN: 18 mg/dL (ref 4–21)
Creatinine: 0.9 mg/dL (ref 0.5–1.1)
POTASSIUM: 3.8 mmol/L (ref 3.4–5.3)
SODIUM: 143 mmol/L (ref 137–147)

## 2015-10-22 DIAGNOSIS — I6930 Unspecified sequelae of cerebral infarction: Secondary | ICD-10-CM | POA: Diagnosis not present

## 2015-10-22 DIAGNOSIS — R488 Other symbolic dysfunctions: Secondary | ICD-10-CM | POA: Diagnosis not present

## 2015-10-22 DIAGNOSIS — M5126 Other intervertebral disc displacement, lumbar region: Secondary | ICD-10-CM | POA: Diagnosis not present

## 2015-10-22 DIAGNOSIS — M1990 Unspecified osteoarthritis, unspecified site: Secondary | ICD-10-CM | POA: Diagnosis not present

## 2015-10-22 DIAGNOSIS — M7551 Bursitis of right shoulder: Secondary | ICD-10-CM | POA: Diagnosis not present

## 2015-10-22 DIAGNOSIS — I872 Venous insufficiency (chronic) (peripheral): Secondary | ICD-10-CM | POA: Diagnosis not present

## 2015-10-22 DIAGNOSIS — R5381 Other malaise: Secondary | ICD-10-CM | POA: Diagnosis not present

## 2015-10-22 DIAGNOSIS — R270 Ataxia, unspecified: Secondary | ICD-10-CM | POA: Diagnosis not present

## 2015-10-22 DIAGNOSIS — I69354 Hemiplegia and hemiparesis following cerebral infarction affecting left non-dominant side: Secondary | ICD-10-CM | POA: Diagnosis not present

## 2015-10-22 DIAGNOSIS — M6289 Other specified disorders of muscle: Secondary | ICD-10-CM | POA: Diagnosis not present

## 2015-10-22 DIAGNOSIS — R1312 Dysphagia, oropharyngeal phase: Secondary | ICD-10-CM | POA: Diagnosis not present

## 2015-10-22 DIAGNOSIS — I6789 Other cerebrovascular disease: Secondary | ICD-10-CM | POA: Diagnosis not present

## 2015-10-22 DIAGNOSIS — N183 Chronic kidney disease, stage 3 (moderate): Secondary | ICD-10-CM | POA: Diagnosis not present

## 2015-10-22 DIAGNOSIS — J45909 Unspecified asthma, uncomplicated: Secondary | ICD-10-CM | POA: Diagnosis not present

## 2015-10-22 DIAGNOSIS — I69398 Other sequelae of cerebral infarction: Secondary | ICD-10-CM | POA: Diagnosis not present

## 2015-10-22 DIAGNOSIS — I679 Cerebrovascular disease, unspecified: Secondary | ICD-10-CM | POA: Diagnosis not present

## 2015-10-22 DIAGNOSIS — E785 Hyperlipidemia, unspecified: Secondary | ICD-10-CM | POA: Diagnosis not present

## 2015-10-22 DIAGNOSIS — I69351 Hemiplegia and hemiparesis following cerebral infarction affecting right dominant side: Secondary | ICD-10-CM | POA: Diagnosis not present

## 2015-10-22 DIAGNOSIS — R269 Unspecified abnormalities of gait and mobility: Secondary | ICD-10-CM | POA: Diagnosis not present

## 2015-10-22 DIAGNOSIS — R471 Dysarthria and anarthria: Secondary | ICD-10-CM | POA: Diagnosis not present

## 2015-10-22 DIAGNOSIS — I639 Cerebral infarction, unspecified: Secondary | ICD-10-CM | POA: Diagnosis not present

## 2015-10-22 DIAGNOSIS — I517 Cardiomegaly: Secondary | ICD-10-CM | POA: Diagnosis not present

## 2015-10-22 DIAGNOSIS — Z5189 Encounter for other specified aftercare: Secondary | ICD-10-CM | POA: Diagnosis not present

## 2015-10-22 DIAGNOSIS — J4550 Severe persistent asthma, uncomplicated: Secondary | ICD-10-CM | POA: Diagnosis not present

## 2015-10-22 DIAGNOSIS — M6281 Muscle weakness (generalized): Secondary | ICD-10-CM | POA: Diagnosis not present

## 2015-10-22 DIAGNOSIS — I959 Hypotension, unspecified: Secondary | ICD-10-CM | POA: Diagnosis not present

## 2015-10-22 DIAGNOSIS — R2681 Unsteadiness on feet: Secondary | ICD-10-CM | POA: Diagnosis not present

## 2015-10-22 DIAGNOSIS — R4189 Other symptoms and signs involving cognitive functions and awareness: Secondary | ICD-10-CM | POA: Diagnosis not present

## 2015-10-22 DIAGNOSIS — R413 Other amnesia: Secondary | ICD-10-CM | POA: Diagnosis not present

## 2015-10-23 ENCOUNTER — Encounter: Payer: Self-pay | Admitting: Internal Medicine

## 2015-10-23 ENCOUNTER — Non-Acute Institutional Stay (SKILLED_NURSING_FACILITY): Payer: Medicare Other | Admitting: Internal Medicine

## 2015-10-23 DIAGNOSIS — I639 Cerebral infarction, unspecified: Secondary | ICD-10-CM

## 2015-10-23 DIAGNOSIS — I959 Hypotension, unspecified: Secondary | ICD-10-CM

## 2015-10-23 DIAGNOSIS — J4550 Severe persistent asthma, uncomplicated: Secondary | ICD-10-CM

## 2015-10-23 DIAGNOSIS — M6289 Other specified disorders of muscle: Secondary | ICD-10-CM

## 2015-10-23 DIAGNOSIS — E785 Hyperlipidemia, unspecified: Secondary | ICD-10-CM

## 2015-10-23 DIAGNOSIS — R531 Weakness: Secondary | ICD-10-CM

## 2015-10-23 DIAGNOSIS — R5381 Other malaise: Secondary | ICD-10-CM

## 2015-10-23 DIAGNOSIS — R4189 Other symptoms and signs involving cognitive functions and awareness: Secondary | ICD-10-CM

## 2015-10-23 DIAGNOSIS — R471 Dysarthria and anarthria: Secondary | ICD-10-CM

## 2015-10-23 NOTE — Progress Notes (Signed)
LOCATION: [place]  PCP: Gennette Pac, MD   Code Status: Full Code  Goals of care: Advanced Directive information Advanced Directives 10/05/2015  Does patient have an advance directive? Yes  Type of Advance Directive Healthcare Power of Attorney  Copy of advanced directive(s) in chart? No - copy requested       Extended Emergency Contact Information Primary Emergency Contact: Marixa, Hindes Address: [address]          [place] [address] [place] Phone: [phone] Mobile Phone: [phone] Relation: Spouse Secondary Emergency Contact: [name],[name]  [place] Phone: [phone] Relation: Son   No Known Allergies  Chief Complaint  Patient presents with  . New Admit To SNF    New Admission     HPI:  Patient is a 82 y.o. female seen today for short term rehabilitation post hospital admission from 10/19/15-10/22/15 with acute CVA with left sided weakness and facial droop. She was seen by neurology and aspirin and plavix were recommended. She had a CVA recently in March 2017 as well.  She is seen in her room today. She denies any concern this am. No new concern from nursing staff. She has PMH of CVA with right sided residual weakness, HLD, asthma among others.    Review of Systems:  Constitutional: Negative for fever, chills, diaphoresis. Feels tired.  HENT: Negative for headache, congestion, nasal discharge, difficulty swallowing.   Eyes: Negative for blurred vision, double vision and discharge. Wears glasses.  Respiratory: Negative for cough, shortness of breath and wheezing.   Cardiovascular: Negative for chest pain, palpitations, leg swelling.  Gastrointestinal: Negative for heartburn, nausea, vomiting, abdominal pain, loss of appetite. Last bowel movement was yesterday. Genitourinary: Negative for dysuria and flank pain.  Musculoskeletal: Negative for back pain, fall in the facility.  Skin:  Negative for itching, rash.  Neurological: Negative for dizziness. Psychiatric/Behavioral: Negative for depression.   Past Medical History  Diagnosis Date  . Blindness     Legally blind, right eye  . Asthmatic bronchitis   . Asthma     Moderate, persistent  . Fibrocystic breast disease   . Allergic rhinitis   . Hyperlipidemia   . Venous insufficiency     lower extremities  . HNP (herniated nucleus pulposus), lumbar     L4-L5  . Left ventricular hypertrophy 2008    mild  . Antibiotic-associated diarrhea   . Stroke (cerebrum) ([place])   . Physical deconditioning   . Right-sided lacunar infarction ([place])   . Acute left hemiparesis ([place])   . Right sided weakness   . Cough   . Protein calorie malnutrition [facility])    Past Surgical History  Procedure Laterality Date  . Cataract extraction    . Colonoscopy     Social History:   reports that she has been passively smoking.  She has never used smokeless tobacco. She reports that she does not drink alcohol or use illicit drugs.  Family History  Problem Relation Age of Onset  . Heart attack Sister   . Alzheimer's disease Sister   . Asthma Son     Medications:   Medication List       This list is accurate as of: 10/23/15  1:50 PM.  Always use your most recent med list.               albuterol 108 (90 Base) MCG/ACT inhaler  Commonly known as:  PROVENTIL HFA;VENTOLIN HFA  Inhale 2 puffs  into the lungs every 6 (six) hours as needed for wheezing or shortness of breath.     aspirin 81 MG chewable tablet  Chew 81 mg by mouth daily.     atorvastatin 10 MG tablet  Commonly known as:  LIPITOR  Take 10 mg by mouth daily.     b complex vitamins tablet  Take 1 tablet by mouth daily.     CALCIUM CARBONATE-VITAMIN D3 PO  Take 1 tablet by mouth 2 (two) times daily.     clopidogrel 75 MG tablet  Commonly known as:  PLAVIX  Take 1 tablet (75 mg total) by mouth daily.     mometasone-formoterol 100-5 MCG/ACT Aero  Commonly  known as:  DULERA  Inhale 2 puffs into the lungs 2 (two) times daily.     MULTIVITAMIN/IRON PO  Take 1 tablet by mouth daily.     predniSONE 5 MG tablet  Commonly known as:  DELTASONE  Take 5 mg by mouth daily with breakfast. Reported on 10/05/2015     pyridOXINE 50 MG tablet  Commonly known as:  B-6  Take 50 mg by mouth daily.        Immunizations: Immunization History  Administered Date(s) Administered  . Influenza Split 03/20/2011, 03/31/2012, 05/07/2013, 02/27/2014  . Influenza Whole 04/06/2010  . Influenza-Unspecified 03/08/2015  . PPD Test 09/25/2015  . Pneumococcal Conjugate-13 12/08/2013  . Pneumococcal Polysaccharide-23 12/08/2009  . Tdap 12/08/2013     Physical Exam: Filed Vitals:   10/23/15 1340  BP: 108/60  Pulse: 82  Temp: 96.1 F (35.6 C)  TempSrc: Oral  Resp: 16  Height: 5\' 4"  (1.626 m)  Weight: 162 lb (73.483 kg)  SpO2: 95%   Body mass index is 27.79 kg/(m^2).  General- elderly female, well built, in no acute distress Head- normocephalic, atraumatic Nose- no maxillary or frontal sinus tenderness, no nasal discharge Throat- moist mucus membrane, has dentures  Eyes- legally blind in right eye, no pallor, no icterus, no discharge, normal conjunctiva, normal sclera Neck- no cervical lymphadenopathy Cardiovascular- normal s1,s2, no murmur, trace leg edema Respiratory- bilateral clear to auscultation, no wheeze, no rhonchi, no crackles, no use of accessory muscles Abdomen- bowel sounds present, soft, non tender Musculoskeletal- able to move all 4 extremities, right sided UE strength 3/5, LUE strength 4/5, LLE strength 5/5, RLE strength 4/5, generalized weakness Neurological- alert and oriented to person and place only, dysarthria present, left facial droop + Skin- warm and dry Psychiatry- normal mood and affect    Labs reviewed: Basic Metabolic Panel:  Recent Labs  09/11/15 0544 09/11/15 0552  09/14/15 0440  09/21/15 0537 09/27/15 10/04/15  10/21/15  NA 143 143  --  144  < > 143 145 138 143  K 4.1 4.1  --  3.8  --  3.8 4.1 4.1 3.8  CL 108 104  --  109  --  109  --   --   --   CO2 27  --   --  25  --  27  --   --   --   GLUCOSE 80 75  --  76  --  81  --   --   --   BUN 16 21*  --  13  < > 13 20 17 18   CREATININE 1.11* 1.00  < > 1.05*  < > 1.00 1.0 1.0 0.9  CALCIUM 8.8*  --   --  8.5*  --  8.8*  --   --   --   < > =  values in this interval not displayed. Liver Function Tests:  Recent Labs  08/02/15 0513 09/11/15 0544 09/14/15 0440 10/19/15  AST 21 20 18 16   ALT 18 17 15 13   ALKPHOS 42 43 44 48  BILITOT 0.6 0.7 0.6  --   PROT 5.9* 5.8* 5.3*  --   ALBUMIN 3.1* 3.0* 2.7*  --     Recent Labs  04/05/15 2252  LIPASE 23   No results for input(s): AMMONIA in the last 8760 hours. CBC:  Recent Labs  09/13/15 1709 09/14/15 0440  09/21/15 0537 09/27/15 10/04/15 10/21/15  WBC 6.9 7.1  < > 7.4 9.3 9.3 8.3  NEUTROABS  --  3.3  --  3.3 4  --   --   HGB 13.5 13.1  --  12.5 12.9 14.6 12.7  HCT 41.7 40.6  --  39.7 40 43 39  MCV 88.2 87.9  --  87.8  --   --   --   PLT 229 233  --  236 258 235 269  < > = values in this interval not displayed. Cardiac Enzymes:  Recent Labs  12/16/14 1025 04/23/15 1856  TROPONINI <0.03 <0.03   BNP: Invalid input(s): POCBNP CBG:  Recent Labs  04/10/15 1700 08/02/15 0524 09/11/15 0544  GLUCAP 112* 80 78    Radiological Exams: Xr Chest Portable 10/19/15 Impression: No active disease.  Ct Head Wo Contrast 10/19/15 Impression: 1. Possible acute infarct involving the right insular cortex. As such, high density of the right supraclinoid ICA could reflect thrombus. No acute hemorrhage. ASPECTS is 9. 2. Remote small vessel infarcts in the bilateral cerebellum and thalamus. 3. Sinusitis with maxillary fluid levels bilaterally.  Assessment/Plan  Physical deconditioning Will have her work with physical therapy and occupational therapy team to help with gait training and muscle  strengthening exercises.fall precautions. Skin care. Encourage to be out of bed.   Acute CVA  continue aspirin 81 mg daily and Plavix 75 mg daily. Has follow up with neurology. Will have her work with therapy team to train for her ADLs. Continue atorvastatin.  Dysarthria Get SLP consult  Cognitive impairment Will have her work with SLP team. Supportive care for now  HLD Continue atorvastatin 10 mg daily  Left sided hemiparesis Post CVA, improving. Will have patient work with PT/OT as tolerated to regain strength and restore function.  Fall precautions are in place. Physiatry team to follow. Also has Right sided residual weakness  Asthma  continue Dulera twice daily, Prednisone 5 mg daily and prn albuterol  Low blood pressure Check BP / HR bid x 1 week.    Goals of care: short term rehabilitation   Labs/tests ordered: cbc, cmp 10/29/15  Family/ staff Communication: reviewed care plan with patient and nursing supervisor    Blanchie Serve, MD Internal Medicine [place], [place] [address] Cell Phone (Monday-Friday 8 am - 5 pm): [phone] On Call: [phone] and follow prompts after 5 pm and on weekends Office Phone: [phone] Office Fax: [phone]

## 2015-10-29 LAB — BASIC METABOLIC PANEL
BUN: 17 mg/dL (ref 4–21)
CREATININE: 1 mg/dL (ref 0.5–1.1)
Glucose: 79 mg/dL
Potassium: 4 mmol/L (ref 3.4–5.3)
Sodium: 145 mmol/L (ref 137–147)

## 2015-10-29 LAB — CBC AND DIFFERENTIAL
HCT: 38 % (ref 36–46)
HEMOGLOBIN: 12.3 g/dL (ref 12.0–16.0)
Neutrophils Absolute: 3 /uL
PLATELETS: 277 10*3/uL (ref 150–399)
WBC: 6.1 10*3/mL

## 2015-10-29 LAB — HEPATIC FUNCTION PANEL
ALT: 10 U/L (ref 7–35)
AST: 13 U/L (ref 13–35)
Alkaline Phosphatase: 49 U/L (ref 25–125)
BILIRUBIN, TOTAL: 0.6 mg/dL

## 2015-11-02 ENCOUNTER — Encounter: Payer: Medicare Other | Attending: Physical Medicine & Rehabilitation

## 2015-11-02 ENCOUNTER — Ambulatory Visit (HOSPITAL_BASED_OUTPATIENT_CLINIC_OR_DEPARTMENT_OTHER): Payer: Medicare Other | Admitting: Physical Medicine & Rehabilitation

## 2015-11-02 ENCOUNTER — Encounter: Payer: Self-pay | Admitting: Physical Medicine & Rehabilitation

## 2015-11-02 VITALS — BP 140/69 | HR 80 | Resp 14

## 2015-11-02 DIAGNOSIS — R269 Unspecified abnormalities of gait and mobility: Secondary | ICD-10-CM

## 2015-11-02 DIAGNOSIS — I639 Cerebral infarction, unspecified: Secondary | ICD-10-CM | POA: Diagnosis not present

## 2015-11-02 DIAGNOSIS — I872 Venous insufficiency (chronic) (peripheral): Secondary | ICD-10-CM | POA: Insufficient documentation

## 2015-11-02 DIAGNOSIS — M5126 Other intervertebral disc displacement, lumbar region: Secondary | ICD-10-CM | POA: Insufficient documentation

## 2015-11-02 DIAGNOSIS — E785 Hyperlipidemia, unspecified: Secondary | ICD-10-CM | POA: Insufficient documentation

## 2015-11-02 DIAGNOSIS — I6930 Unspecified sequelae of cerebral infarction: Secondary | ICD-10-CM

## 2015-11-02 DIAGNOSIS — R270 Ataxia, unspecified: Secondary | ICD-10-CM | POA: Diagnosis not present

## 2015-11-02 DIAGNOSIS — I517 Cardiomegaly: Secondary | ICD-10-CM | POA: Insufficient documentation

## 2015-11-02 DIAGNOSIS — I69398 Other sequelae of cerebral infarction: Secondary | ICD-10-CM | POA: Diagnosis not present

## 2015-11-02 DIAGNOSIS — J45909 Unspecified asthma, uncomplicated: Secondary | ICD-10-CM | POA: Insufficient documentation

## 2015-11-02 DIAGNOSIS — M7551 Bursitis of right shoulder: Secondary | ICD-10-CM | POA: Diagnosis not present

## 2015-11-02 NOTE — Progress Notes (Signed)
Subjective:    Patient ID: Darlene Murray, female    DOB: 18-Dec-1921, 82 y.o.   MRN: [id number] CIR stay after recurrent stroke DATE OF ADMISSION: 09/13/2015 DATE OF DISCHARGE: 09/25/2015.  HPI right posterior limb, left occipital, and right thalamic CVA after receiving CIR. She was discharged to  SNF. Pt presents with her husband. She has been doing well.  She is anticipated for discharge from SNF next week. She denies falls.  Prior right shoulder ultrasound with results as below  Biggest issue currently is right shoulder pain. This inhibits dressing. She tends to guard the right arm. Pain Inventory Average Pain 1 Pain Right Now 0 My pain is sharp  In the last 24 hours, has pain interfered with the following? General activity 1 Relation with others NA Enjoyment of life NA What TIME of day is your pain at its worst? NA Sleep (in general) Good  Pain is worse with: some activites Pain improves with: injections Relief from Meds: 0  Mobility ability to climb steps?  no do you drive?  no use a wheelchair needs help with transfers Do you have any goals in this area?  yes  Function retired I need assistance with the following:  dressing, bathing, toileting, meal prep, household duties and shopping  Neuro/Psych weakness tremor trouble walking confusion  Prior Studies Any changes since last visit?  no CT/MRI Complete diagnostic ultrasound of the right shoulder  Indication shoulder pain post stroke  12 Hz linear transducer utilized  Short axis view bicipital groove demonstrates fluid around the biceps tendon there is no evidence of subluxation with external and internal rotation. There appears to be some tethering of the tendon medially around the medial aspect of the groove  Long axis view bicipital groove demonstrating fluid inferior to the biceps tendon. There is also decreased tendon cross section diameter  Long axis view right subscapularis no evidence of  tendon tear there is evidence of cortical irregularity along the lesser tuberosity  Short axis view right subscapularis normal tendon with no evidence of tear Physicians involved in your care Primary care . Neurologist .   Family History  Problem Relation Age of Onset  . Heart attack Sister   . Alzheimer's disease Sister   . Asthma Son    Social History   Social History  . Marital Status: Married    Spouse Name: N/A  . Number of Children: 3  . Years of Education: College   Occupational History  . Retired     Pharmacist, hospital   Social History Main Topics  . Smoking status: Passive Smoke Exposure - Never Smoker  . Smokeless tobacco: Never Used     Comment: Prior exposure through husband.   . Alcohol Use: No  . Drug Use: No  . Sexual Activity:    Partners: Female   Other Topics Concern  . None   Social History Narrative   Lives at home with her husband.   Right-handed.   1 cup coffee per day.      [facility]:   Originally from [facility]. She has previously lived in [place]. Previously traveled to [place]. No pets currently. No prior bird exposure. Previously worked as a Education officer, museum. No mold or hot tub exposure.    Past Surgical History  Procedure Laterality Date  . Cataract extraction    . Colonoscopy     Past Medical History  Diagnosis Date  . Blindness     Legally blind, right eye  . Asthmatic bronchitis   .  Asthma     Moderate, persistent  . Fibrocystic breast disease   . Allergic rhinitis   . Hyperlipidemia   . Venous insufficiency     lower extremities  . HNP (herniated nucleus pulposus), lumbar     L4-L5  . Left ventricular hypertrophy 2008    mild  . Antibiotic-associated diarrhea   . Stroke (cerebrum) ([place])   . Physical deconditioning   . Right-sided lacunar infarction ([place])   . Acute left hemiparesis ([place])   . Right sided weakness   . Cough   . Protein calorie malnutrition ([facility])    BP 140/69 mmHg  Pulse 80  Resp 14  SpO2 98%  Opioid  Risk Score:   Fall Risk Score:  `1  Depression screen PHQ 2/9  Depression screen [facility] 2/9 10/05/2015 05/17/2015  Decreased Interest 0 0  Down, Depressed, Hopeless 0 0  PHQ - 2 Score 0 0  Altered sleeping - 0  Tired, decreased energy - 0  Change in appetite - 0  Feeling bad or failure about yourself  - 0  Trouble concentrating - 0  Moving slowly or fidgety/restless - 0  Suicidal thoughts - 0  PHQ-9 Score - 0  Difficult doing work/chores - Not difficult at all     Review of Systems  Musculoskeletal: Positive for gait problem.  Neurological: Positive for tremors and weakness.  Psychiatric/Behavioral: Positive for confusion.  All other systems reviewed and are negative.      Objective:   Physical Exam  Constitutional: She is oriented to person, place, and time. She appears well-developed and well-nourished.  HENT:  Head: Normocephalic and atraumatic.  Eyes: Conjunctivae and EOM are normal. Pupils are equal, round, and reactive to light.  Neck: Normal range of motion.  Musculoskeletal:       Right shoulder: She exhibits decreased range of motion, tenderness and decreased strength. She exhibits no effusion, no crepitus and no deformity.  Neurological: She is alert and oriented to person, place, and time.  Psychiatric: She has a normal mood and affect.  Nursing note and vitals reviewed.   Positive impingement sign right shoulder Unable to elevate right shoulder. She uses right trapezius. Bicep tricep and isolation are 4 minus. She has difficulty reaching because of shoulder stability. Grip is 4 on the right 4+ strength in the left deltoid, biceps, triceps, grip Bilateral lower extremity 4 minus at the hip flexor and extensor ankle dorsiflexion plantar flexor She is oriented to person, [name] office In [place] , Friday but she thinks it's November and the year is 2003      Assessment & Plan:  1.  Chronic post stroke shoulder pain. She does have ultrasound evidence of  rotator cuff arthropathy partial rotator cuff tear as well as before meals joint arthropathy. Her pain is more posterior lateral today. She does have positive impingement sign. We will do a subacromial injection today. She had previous relief with right acromioclavicular joint injection February 2017and if she does not have good relief with this injection we may need to repeat this next month.  Shoulder injection Right  Without ultrasound guidance)  Indication:Right Shoulder pain not relieved by medication management and other conservative care.  Informed consent was obtained after describing risks and benefits of the procedure with the patient, this includes bleeding, bruising, infection and medication side effects. The patient wishes to proceed and has given written consent. Patient was placed in a seated position. The RIght shoulder was marked and prepped with betadine in the subacromial  area. A 25-gauge 1-1/2 inch needle was inserted into the subacromial area. After negative draw back for blood, a solution containing 1 mL of 6 mg per ML betamethasone and 4 mL of 1% lidocaine was injected. A band aid was applied. The patient tolerated the procedure well. Post procedure instructions were given.  2. Gait disorder related to recurrent CVA. She currently needs assistance of 2 people. She is able to use a Clarise Cruz lift, would recommend 1 for home use given upcoming discharge and husband's inability to transfer the patient independently.

## 2015-11-02 NOTE — Patient Instructions (Signed)
Right subacromial injection today with Cel[place] and lidocaine Would expect full effect after one to 2 days. If there is any soreness from the injection may use ice 20 minutes every 2 hours as needed

## 2015-11-06 ENCOUNTER — Ambulatory Visit (INDEPENDENT_AMBULATORY_CARE_PROVIDER_SITE_OTHER): Payer: Medicare Other | Admitting: Neurology

## 2015-11-06 ENCOUNTER — Encounter: Payer: Self-pay | Admitting: Neurology

## 2015-11-06 VITALS — BP 132/73 | HR 64

## 2015-11-06 DIAGNOSIS — I639 Cerebral infarction, unspecified: Secondary | ICD-10-CM | POA: Diagnosis not present

## 2015-11-06 DIAGNOSIS — R413 Other amnesia: Secondary | ICD-10-CM

## 2015-11-06 MED ORDER — ASPIRIN-DIPYRIDAMOLE ER 25-200 MG PO CP12: 1.0000 | ORAL_CAPSULE | Freq: Two times a day (BID) | ORAL | Status: DC

## 2015-11-06 NOTE — Progress Notes (Signed)
Chief Complaint  Patient presents with  . Cerebrovascular Accident    She is here with her husband, Jenny Reichmann.  Reports another stroke on 10/24/15.  She is now at [facility] in PT, OT and speech therapy.  She has been started on Plavix 75mg  daily.   Chief Complaint  Patient presents with  . Cerebrovascular Accident    She is here with her husband, Jenny Reichmann.  Reports another stroke on 10/24/15.  She is now at [facility] in PT, OT and speech therapy.  She has been started on Plavix 75mg  daily.      PATIENT: Darlene Murray DOB: February 04, 1942  Chief Complaint  Patient presents with  . Cerebrovascular Accident    She is here with her husband, Jenny Reichmann.  Reports another stroke on 10/24/15.  She is now at [facility] in PT, OT and speech therapy.  She has been started on Plavix 75mg  daily.     HISTORICAL  Darlene Murray is an 82 years old right-handed female, accompanied by her husband, and her son [name] at today's visit, following up her hospital admission for stroke in April 05, 2015  She had a past medical history of hypertension, hyperlipidemia, taking low-dose prednisone 5 mg daily for asthma, born blindness of right eye, left vision is good, she was highly functional prior to her most recent stroke, driving, cooking, ambulate without assistance   She was admitted to the hospital in April 04 2015 for acute onset of nausea or vomiting unsteady gait, I have personally reviewed MRI of the brain in April 06 2015, left hippocampus hemorrhagic stroke, also moderate nonhemorrhagic stroke involving posterior right upper and mid pons, adjacent right cerebellum, acute tiny nonhemorrhagic left thalamic infarction, the mechanism of her stroke was suspicious for embolic, she was put on aspirin 325 mg daily.  CT angiogram of head and neck showed no significant large vessel disease, intracranial atherosclerosis. Echocardiogram was normal, estimated ejection fraction 60-65%, Laboratory  showed LDL 104, A1c 6.0  With rehabilitation, she has regained marked recovery, she was able to ambulate without assistance, good range of motion of her right arm,  She was readmitted to the hospital again in October for UTI, and urosepsis, upon discharge in October 21st 2016, she was noted to have profound right arm weakness, increased gait difficulty, now she is wheelchair bound, need assistant to take a few steps using bathroom,  Over the past few weeks, she again making some recovery, able to dress herself, but still has profound gait difficulty, she denies bowel and bladder incontinence. She complains of mild to moderate right shoulder achy pain, limited range of motion of her right shoulder.  UPDATE Jun 13 2015: She fell twice since last visit, her right leg gave out, she still has significant right arm and leg weakness, gait difficulty,  We have personally reviewed MRI brain showed  1. There are new subacute infarcts in the left thalamus, left pons and left cerebellum that are new compared to MRI on 04/06/15.  2. Chronic infarcts noted the left antero-medial thalamus, right cerebellum, right pons and left hippocampus. These are stable from MRI on 04/06/15.  3. Mild scattered periventricular and subcortical chronic small vessel ischemic disease.   MRI cervical showed multi-level degenerative changes, with mild canal stenosis at C4-5, no cord signal changes, muti-level foraminal stenosis.  She was on ASA 325mg  daily  UPDATE May 2nd 2017:  She suffered from a stroke since last visit in December 2016, I have reviewed and summarized  the record,  In September 11 2015, she presented with sudden onset left-sided weakness, slurred speech, I personally reviewed MRI of the brain, acute nonhemorrhagic infarction at right thalamus, posterior limb of internal capsule, tiny acute medial left occipital lobe infarction, remote bilateral cerebellar infarction, MRA of the brain and neck showed moderate to  marked narrowing of the proximal right vertebral artery.  Echocardiogram with ejection fraction of 123456, grade 1 diastolic dysfunction. The patient did not receive tPA. She was discharged aspirin and Plavix went to inpatient rehabilitation, discharged home  She was still able to dress herself with minimum help, in October 19 2015, she had a sudden worsening gait difficulty, slurred speech, was taken to [facility], I reviewed CAT scan of the brain report, probable acute stroke involving right insular cortex, remote small vessel disease at bilateral cerebellum and thalamus, maxillary sinus level bilaterally, laboratory evaluation showed hemoglobin 12 point 7, creatinine of 0.93, normal liver functional test, she is now at rehabilitation place, husband planning on to bring her home soon. She is no longer ambulatory, had increased bilateral arm and leg weakness, she also complains of short-term memory trouble,right shoulder pain  REVIEW OF SYSTEMS: Full 14 system review of systems performed and notable only for short-term memory loss, walking difficulty, joint pain,  ALLERGIES: No Known Allergies  HOME MEDICATIONS: Current Outpatient Prescriptions  Medication Sig Dispense Refill  . albuterol (PROVENTIL HFA;VENTOLIN HFA) 108 (90 Base) MCG/ACT inhaler Inhale 2 puffs into the lungs every 6 (six) hours as needed for wheezing or shortness of breath.     Marland Kitchen aspirin 81 MG chewable tablet Chew 81 mg by mouth daily.    Marland Kitchen b complex vitamins tablet Take 1 tablet by mouth daily.    . Calcium Carb-Cholecalciferol (CALCIUM CARBONATE-VITAMIN D3 PO) Take 1 tablet by mouth 2 (two) times daily.    . clopidogrel (PLAVIX) 75 MG tablet Take 1 tablet (75 mg total) by mouth daily. 30 tablet 11  . mometasone-formoterol (DULERA) 100-5 MCG/ACT AERO Inhale 2 puffs into the lungs 2 (two) times daily. 3 Inhaler 1  . Multiple Vitamins-Iron (MULTIVITAMIN/IRON PO) Take 1 tablet by mouth daily.    . pravastatin  (PRAVACHOL) 20 MG tablet     . predniSONE (DELTASONE) 5 MG tablet Take 5 mg by mouth daily with breakfast. Reported on 10/05/2015    . pyridOXINE (B-6) 50 MG tablet Take 50 mg by mouth daily.     No current facility-administered medications for this visit.    PAST MEDICAL HISTORY: Past Medical History  Diagnosis Date  . Blindness     Legally blind, right eye  . Asthmatic bronchitis   . Asthma     Moderate, persistent  . Fibrocystic breast disease   . Allergic rhinitis   . Hyperlipidemia   . Venous insufficiency     lower extremities  . HNP (herniated nucleus pulposus), lumbar     L4-L5  . Left ventricular hypertrophy 2008    mild  . Antibiotic-associated diarrhea   . Stroke (cerebrum) ([place])   . Physical deconditioning   . Right-sided lacunar infarction ([place])   . Acute left hemiparesis ([place])   . Right sided weakness   . Cough   . Protein calorie malnutrition ([place])     PAST SURGICAL HISTORY: Past Surgical History  Procedure Laterality Date  . Cataract extraction    . Colonoscopy      FAMILY HISTORY: Family History  Problem Relation Age of Onset  . Heart attack Sister   .  Alzheimer's disease Sister   . Asthma Son     SOCIAL HISTORY:  Social History   Social History  . Marital Status: Married    Spouse Name: N/A  . Number of Children: 3  . Years of Education: College   Occupational History  . Retired     Pharmacist, hospital   Social History Main Topics  . Smoking status: Passive Smoke Exposure - Never Smoker  . Smokeless tobacco: Never Used     Comment: Prior exposure through husband.   . Alcohol Use: No  . Drug Use: No  . Sexual Activity:    Partners: Female   Other Topics Concern  . Not on file   Social History Narrative   Lives at home with her husband.   Right-handed.   1 cup coffee per day.      [facility]:   Originally from [place]. She has previously lived in [place]. Previously traveled to [place]. No pets currently. No prior bird exposure.  Previously worked as a Education officer, museum. No mold or hot tub exposure.      PHYSICAL EXAM   Filed Vitals:   11/06/15 1618  BP: 132/73  Pulse: 64    Not recorded      There is no weight on file to calculate BMI.  PHYSICAL EXAMNIATION:  Gen: NAD, conversant, well nourised, obese, well groomed                     Cardiovascular: Regular rate rhythm, no peripheral edema, warm, nontender. Eyes: Conjunctivae clear without exudates or hemorrhage Neck: Supple, no carotid bruise. Pulmonary: Clear to auscultation bilaterally   NEUROLOGICAL EXAM:  MENTAL STATUS: Speech:    Speech is normal; fluent and spontaneous with normal comprehension.  Cognition:      Orientation to time, place and person     Recent and remote Memory: She missed 3 out of 3 recalls     Normal Attention span and concentration     Normal Language, naming, repeating,spontaneous speech     Fund of knowledge   CRANIAL NERVES: CN II: Visual fields are full to confrontation. Bilateral pupil is round reactive to light CN III, IV, VI: She has a vertical gaze paralysis, vertical nystagmus,  CN V: Facial sensation is intact to pinprick in all 3 divisions bilaterally. Corneal responses are intact.  CN VII: Face is symmetric with normal eye closure and smile. CN VIII: Hearing is normal to rubbing fingers CN IX, X: Palate elevates symmetrically. Phonation is normal. CN XI: Head turning and shoulder shrug are intact CN XII: Tongue is midline with normal movements and no atrophy.  MOTOR: She has mild spasticity of right upper extremity, complains of right shoulder pain, limited range of motion of right shoulder, proximal and distal bilateral upper extremity motor strength is 4, she has mild bilateral hip flexion weakness,    REFLEXES: Reflexes are  hyperreflexia in both upper and lower extremity, right worse than left, plantar responses were extensor bilaterally   SENSORY: Intact to light touch, pinprick, position sense,  and vibration sense are intact in fingers and toes.  COORDINATION: She has bilateral finger to nose dysmetria,  Gait: Deferred   DIAGNOSTIC DATA (LABS, IMAGING, TESTING) - I reviewed patient records, labs, notes, testing and imaging myself where available.   ASSESSMENT AND PLAN  Joleigh Radebaugh is a 82 y.o. female    Stroke    multiple stroke involving cortex, and also posterior circulation  Most likely embolic stroke,  Keep Plavix 75 mg every day, change to Aggrenox 1 tablet twice a day  she never have TEE and cardiac monitoring complete, in-hospital telemetry showed no dysrhythmia      Mild cognitive impairment  Age-related process, central nervous system degenerative disorder, likely a vascular component.    Marcial Pacas, M.D. Ph.D.  [facility] [address] [place], [place] [address] Ph: [phone] Fax: [phone]  CC: Hulan Fess, MD

## 2015-11-08 ENCOUNTER — Encounter: Payer: Self-pay | Admitting: Adult Health

## 2015-11-08 ENCOUNTER — Non-Acute Institutional Stay (SKILLED_NURSING_FACILITY): Payer: Medicare Other | Admitting: Adult Health

## 2015-11-08 DIAGNOSIS — E785 Hyperlipidemia, unspecified: Secondary | ICD-10-CM

## 2015-11-08 DIAGNOSIS — R531 Weakness: Secondary | ICD-10-CM

## 2015-11-08 DIAGNOSIS — M6289 Other specified disorders of muscle: Secondary | ICD-10-CM

## 2015-11-08 DIAGNOSIS — R5381 Other malaise: Secondary | ICD-10-CM

## 2015-11-08 DIAGNOSIS — J4550 Severe persistent asthma, uncomplicated: Secondary | ICD-10-CM

## 2015-11-08 DIAGNOSIS — I639 Cerebral infarction, unspecified: Secondary | ICD-10-CM

## 2015-11-08 NOTE — Progress Notes (Signed)
Patient ID: Darlene Murray, female   DOB: 06/12/1922, 82 y.o.   MRN: [id number]    DATE:  11/08/15  MRN:  [id number]  BIRTHDAY: 06-Jun-1922  Facility:  [facility] Home Location:  [place] Room Number: 208-P  LEVEL OF CARE:  SNF (31)  Contact Information    Name Relation Home Work Mobile   Gray Spouse [phone]  [phone]   [name],[name] Son [phone]         Code Status History    Date Active Date Inactive Code Status Order ID Comments User Context   09/13/2015  4:57 PM 09/13/2015  4:57 PM Full Code [id number]  Cathlyn Parsons, PA-C Inpatient   09/13/2015  4:57 PM 09/25/2015  4:03 PM Full Code [id number]  Cathlyn Parsons, PA-C Inpatient   09/11/2015  8:52 AM 09/13/2015  4:57 PM Full Code [id number]  [name], MD ED   08/02/2015  8:13 AM 08/03/2015  5:19 PM Full Code [id number]  Samella Parr, NP Inpatient   04/23/2015  5:42 PM 04/27/2015  4:30 PM Full Code [id number]  Norval Morton, MD Inpatient   04/10/2015  2:44 PM 04/20/2015  1:25 PM Full Code [id number]  Cathlyn Parsons, PA-C Inpatient   04/06/2015  4:49 PM 04/10/2015  2:44 PM Full Code [id number]  Jonetta Osgood, MD Inpatient   04/06/2015  4:29 AM 04/06/2015  4:49 PM Full Code [id number]  Lavina Hamman, MD ED       Chief Complaint  Patient presents with  . Discharge Note    HISTORY OF PRESENT ILLNESS:  This is a 82 year old female who is for discharge home with Home health PT, OT, SW, CNA and Nursing. DME:  Sit-to-stand lift.  She has been admitted to [facility] on 10/22/15 from [facility]. She has [place]  Of legally blind on right eye, asthmatic bronchitis, allergic rhinitis, stroke and left hemiparesis. She was hospitalized with acute CVA with left-sided weakness and facial droop. She was seen by neurology and aspirin and plavix were recommended. She had a CVA recently in March 2017 as well.  Patient was admitted to this facility for short-term  rehabilitation after the patient's recent hospitalization.  Patient has completed SNF rehabilitation and therapy has cleared the patient for discharge.  PAST MEDICAL HISTORY:  Past Medical History  Diagnosis Date  . Blindness     Legally blind, right eye  . Asthmatic bronchitis   . Asthma     Moderate, persistent  . Fibrocystic breast disease   . Allergic rhinitis   . Hyperlipidemia   . Venous insufficiency     lower extremities  . HNP (herniated nucleus pulposus), lumbar     L4-L5  . Left ventricular hypertrophy 2008    mild  . Antibiotic-associated diarrhea   . Stroke (cerebrum) ([place])   . Physical deconditioning   . Right-sided lacunar infarction ([place])   . Acute left hemiparesis ([place])   . Right sided weakness   . Cough   . Protein calorie malnutrition ([place])      CURRENT MEDICATIONS: Reviewed  Patient's Medications  New Prescriptions   No medications on file  Previous Medications   ALBUTEROL (PROVENTIL HFA;VENTOLIN HFA) 108 (90 BASE) MCG/ACT INHALER    Inhale 2 puffs into the lungs every 6 (six) hours as needed for wheezing or shortness of breath.    ATORVASTATIN (LIPITOR) 10 MG TABLET    Take 10 mg by mouth daily.  B COMPLEX VITAMINS TABLET    Take 1 tablet by mouth daily.   BUDESONIDE-FORMOTEROL (SYMBICORT) 160-4.5 MCG/ACT INHALER    Inhale 2 puffs into the lungs 2 (two) times daily.   CALCIUM CARB-CHOLECALCIFEROL (CALCIUM CARBONATE-VITAMIN D3 PO)    Take 1 tablet by mouth 2 (two) times daily.   CLOPIDOGREL (PLAVIX) 75 MG TABLET    Take 1 tablet (75 mg total) by mouth daily.   DIPYRIDAMOLE-ASPIRIN (AGGRENOX) 200-25 MG 12HR CAPSULE    Take 1 capsule by mouth 2 (two) times daily.   MULTIPLE VITAMINS-IRON (MULTIVITAMIN/IRON PO)    Take 1 tablet by mouth daily.   PREDNISONE (DELTASONE) 5 MG TABLET    Take 5 mg by mouth daily with breakfast. Reported on 10/05/2015   PYRIDOXINE (B-6) 50 MG TABLET    Take 50 mg by mouth daily.  Modified Medications   No medications on file   Discontinued Medications   MOMETASONE-FORMOTEROL (DULERA) 100-5 MCG/ACT AERO    Inhale 2 puffs into the lungs 2 (two) times daily.   PRAVASTATIN (PRAVACHOL) 20 MG TABLET         No Known Allergies   REVIEW OF SYSTEMS:    GENERAL: no change in appetite, no fatigue, no weight changes, no fever, chills or weakness EYES: Denies change in vision, dry eyes, eye pain, itching or discharge EARS: Denies change in hearing, ringing in ears, or earache NOSE: Denies nasal congestion or epistaxis MOUTH and THROAT: Denies oral discomfort, gingival pain or bleeding, pain from teeth or hoarseness   RESPIRATORY: no cough, SOB, DOE, wheezing, hemoptysis CARDIAC: no chest pain, edema or palpitations GI: no abdominal pain, diarrhea, constipation, heart burn, nausea or vomiting GU: Denies dysuria, frequency, hematuria, incontinence, or discharge PSYCHIATRIC: Denies feeling of depression or anxiety. No report of hallucinations, insomnia, paranoia, or agitation    PHYSICAL EXAMINATION  GENERAL APPEARANCE: Well nourished. In no acute distress. Normal body habitus SKIN:  Skin is warm and dry.  HEAD: Normal in size and contour. No evidence of trauma EYES: Lids open and close normally. No blepharitis, entropion or ectropion. PERRL. Conjunctivae are clear and sclerae are white. Lenses are without opacity EARS: Pinnae are normal. Patient hears normal voice tunes of the examiner MOUTH and THROAT: Lips are without lesions. Oral mucosa is moist and without lesions. Tongue is normal in shape, size, and color and without lesions NECK: supple, trachea midline, no neck masses, no thyroid tenderness, no thyromegaly LYMPHATICS: no LAN in the neck, no supraclavicular LAN RESPIRATORY: breathing is even & unlabored, BS CTAB CARDIAC: RRR, no murmur,no extra heart sounds, no edema GI: abdomen soft, normal BS, no masses, no tenderness, no hepatomegaly, no splenomegaly EXTREMITIES:  Able to move X 4 extremities;  right-sided upper extremity strength 3/5, LUE 4/5, LLE 5/5 RLE 4/5 PSYCHIATRIC: Alert to person and place and disoriented to time. Affect and behavior are appropriate  LABS/RADIOLOGY: Labs reviewed: Basic Metabolic Panel:  Recent Labs  09/11/15 0544 09/11/15 0552  09/14/15 0440  09/21/15 0537  10/04/15 10/21/15 10/29/15  NA 143 143  --  144  < > 143  < > 138 143 145  K 4.1 4.1  --  3.8  --  3.8  < > 4.1 3.8 4.0  CL 108 104  --  109  --  109  --   --   --   --   CO2 27  --   --  25  --  27  --   --   --   --  GLUCOSE 80 75  --  76  --  81  --   --   --   --   BUN 16 21*  --  13  < > 13  < > 17 18 17   CREATININE 1.11* 1.00  < > 1.05*  < > 1.00  < > 1.0 0.9 1.0  CALCIUM 8.8*  --   --  8.5*  --  8.8*  --   --   --   --   < > = values in this interval not displayed. Liver Function Tests:  Recent Labs  08/02/15 0513 09/11/15 0544 09/14/15 0440 10/19/15 10/29/15  AST 21 20 18 16 13   ALT 18 17 15 13 10   ALKPHOS 42 43 44 48 49  BILITOT 0.6 0.7 0.6  --   --   PROT 5.9* 5.8* 5.3*  --   --   ALBUMIN 3.1* 3.0* 2.7*  --   --     Recent Labs  04/05/15 2252  LIPASE 23   No results for input(s): AMMONIA in the last 8760 hours. CBC:  Recent Labs  09/13/15 1709 09/14/15 0440  09/21/15 0537 09/27/15 10/04/15 10/21/15 10/29/15  WBC 6.9 7.1  < > 7.4 9.3 9.3 8.3 6.1  NEUTROABS  --  3.3  --  3.3 4  --   --  3  HGB 13.5 13.1  --  12.5 12.9 14.6 12.7 12.3  HCT 41.7 40.6  --  39.7 40 43 39 38  MCV 88.2 87.9  --  87.8  --   --   --   --   PLT 229 233  --  236 258 235 269 277  < > = values in this interval not displayed.  Lipid Panel:  Recent Labs  04/07/15 0622 08/03/15 0230 09/12/15 0340  HDL 48 47 39*   Cardiac Enzymes:  Recent Labs  12/16/14 1025 04/23/15 1856  TROPONINI <0.03 <0.03   CBG:  Recent Labs  04/10/15 1700 08/02/15 0524 09/11/15 0544  GLUCAP 112* 80 78      ASSESSMENT/PLAN:    Physical deconditioning - for Home health PT, OT, SW, CNA and  Nursing  Acute CVA - for Home health PT, OT, CNA, SW and Nursing; continue aspirin 81 mg daily and Plavix 75 mg daily; follow up with neurology; continue atorvastatin.  Hyperlipidemia - continue atorvastatin 10 mg daily  Left sided hemiparesis - post CVA; for home health PT, OT, CNA, MSW and nursing  Asthma  - continue Dulera twice daily, Prednisone 5 mg daily and prn albuterol      I have filled out patient's discharge paperwork and written prescriptions.  Patient will receive home health PT, OT, SW, Nursing and CNA.  DME provided:  Sit-to-stand lift  Total discharge time: Greate than 30 minutes  Discharge time involved coordination of the discharge process with Education officer, museum, nursing staff and therapy department. Medical justification for home health services/DME verified.     [facility], NP [employer] [phone]

## 2015-11-09 DIAGNOSIS — I69351 Hemiplegia and hemiparesis following cerebral infarction affecting right dominant side: Secondary | ICD-10-CM

## 2015-11-09 DIAGNOSIS — N183 Chronic kidney disease, stage 3 (moderate): Secondary | ICD-10-CM

## 2015-11-09 DIAGNOSIS — I69354 Hemiplegia and hemiparesis following cerebral infarction affecting left non-dominant side: Secondary | ICD-10-CM

## 2015-11-09 DIAGNOSIS — J45909 Unspecified asthma, uncomplicated: Secondary | ICD-10-CM

## 2015-11-16 DIAGNOSIS — E785 Hyperlipidemia, unspecified: Secondary | ICD-10-CM | POA: Diagnosis not present

## 2015-11-16 DIAGNOSIS — Z87440 Personal history of urinary (tract) infections: Secondary | ICD-10-CM | POA: Diagnosis not present

## 2015-11-16 DIAGNOSIS — I69351 Hemiplegia and hemiparesis following cerebral infarction affecting right dominant side: Secondary | ICD-10-CM | POA: Diagnosis not present

## 2015-11-16 DIAGNOSIS — I69354 Hemiplegia and hemiparesis following cerebral infarction affecting left non-dominant side: Secondary | ICD-10-CM | POA: Diagnosis not present

## 2015-11-16 DIAGNOSIS — J45909 Unspecified asthma, uncomplicated: Secondary | ICD-10-CM | POA: Diagnosis not present

## 2015-11-16 DIAGNOSIS — N183 Chronic kidney disease, stage 3 (moderate): Secondary | ICD-10-CM | POA: Diagnosis not present

## 2015-11-19 DIAGNOSIS — I69351 Hemiplegia and hemiparesis following cerebral infarction affecting right dominant side: Secondary | ICD-10-CM | POA: Diagnosis not present

## 2015-11-19 DIAGNOSIS — Z87440 Personal history of urinary (tract) infections: Secondary | ICD-10-CM | POA: Diagnosis not present

## 2015-11-19 DIAGNOSIS — E785 Hyperlipidemia, unspecified: Secondary | ICD-10-CM | POA: Diagnosis not present

## 2015-11-19 DIAGNOSIS — J45909 Unspecified asthma, uncomplicated: Secondary | ICD-10-CM | POA: Diagnosis not present

## 2015-11-19 DIAGNOSIS — I69354 Hemiplegia and hemiparesis following cerebral infarction affecting left non-dominant side: Secondary | ICD-10-CM | POA: Diagnosis not present

## 2015-11-19 DIAGNOSIS — N183 Chronic kidney disease, stage 3 (moderate): Secondary | ICD-10-CM | POA: Diagnosis not present

## 2015-11-20 DIAGNOSIS — Z87440 Personal history of urinary (tract) infections: Secondary | ICD-10-CM | POA: Diagnosis not present

## 2015-11-20 DIAGNOSIS — J45909 Unspecified asthma, uncomplicated: Secondary | ICD-10-CM | POA: Diagnosis not present

## 2015-11-20 DIAGNOSIS — E785 Hyperlipidemia, unspecified: Secondary | ICD-10-CM | POA: Diagnosis not present

## 2015-11-20 DIAGNOSIS — N183 Chronic kidney disease, stage 3 (moderate): Secondary | ICD-10-CM | POA: Diagnosis not present

## 2015-11-20 DIAGNOSIS — I69351 Hemiplegia and hemiparesis following cerebral infarction affecting right dominant side: Secondary | ICD-10-CM | POA: Diagnosis not present

## 2015-11-20 DIAGNOSIS — I69354 Hemiplegia and hemiparesis following cerebral infarction affecting left non-dominant side: Secondary | ICD-10-CM | POA: Diagnosis not present

## 2015-11-21 DIAGNOSIS — E785 Hyperlipidemia, unspecified: Secondary | ICD-10-CM | POA: Diagnosis not present

## 2015-11-21 DIAGNOSIS — I69351 Hemiplegia and hemiparesis following cerebral infarction affecting right dominant side: Secondary | ICD-10-CM | POA: Diagnosis not present

## 2015-11-21 DIAGNOSIS — Z87440 Personal history of urinary (tract) infections: Secondary | ICD-10-CM | POA: Diagnosis not present

## 2015-11-21 DIAGNOSIS — I69354 Hemiplegia and hemiparesis following cerebral infarction affecting left non-dominant side: Secondary | ICD-10-CM | POA: Diagnosis not present

## 2015-11-21 DIAGNOSIS — J45909 Unspecified asthma, uncomplicated: Secondary | ICD-10-CM | POA: Diagnosis not present

## 2015-11-21 DIAGNOSIS — N183 Chronic kidney disease, stage 3 (moderate): Secondary | ICD-10-CM | POA: Diagnosis not present

## 2015-11-22 DIAGNOSIS — J45909 Unspecified asthma, uncomplicated: Secondary | ICD-10-CM | POA: Diagnosis not present

## 2015-11-22 DIAGNOSIS — E785 Hyperlipidemia, unspecified: Secondary | ICD-10-CM | POA: Diagnosis not present

## 2015-11-22 DIAGNOSIS — Z87440 Personal history of urinary (tract) infections: Secondary | ICD-10-CM | POA: Diagnosis not present

## 2015-11-22 DIAGNOSIS — I69351 Hemiplegia and hemiparesis following cerebral infarction affecting right dominant side: Secondary | ICD-10-CM | POA: Diagnosis not present

## 2015-11-22 DIAGNOSIS — I69354 Hemiplegia and hemiparesis following cerebral infarction affecting left non-dominant side: Secondary | ICD-10-CM | POA: Diagnosis not present

## 2015-11-22 DIAGNOSIS — N183 Chronic kidney disease, stage 3 (moderate): Secondary | ICD-10-CM | POA: Diagnosis not present

## 2015-11-23 DIAGNOSIS — N183 Chronic kidney disease, stage 3 (moderate): Secondary | ICD-10-CM | POA: Diagnosis not present

## 2015-11-23 DIAGNOSIS — E785 Hyperlipidemia, unspecified: Secondary | ICD-10-CM | POA: Diagnosis not present

## 2015-11-23 DIAGNOSIS — I69351 Hemiplegia and hemiparesis following cerebral infarction affecting right dominant side: Secondary | ICD-10-CM | POA: Diagnosis not present

## 2015-11-23 DIAGNOSIS — J45909 Unspecified asthma, uncomplicated: Secondary | ICD-10-CM | POA: Diagnosis not present

## 2015-11-23 DIAGNOSIS — I69354 Hemiplegia and hemiparesis following cerebral infarction affecting left non-dominant side: Secondary | ICD-10-CM | POA: Diagnosis not present

## 2015-11-23 DIAGNOSIS — Z87440 Personal history of urinary (tract) infections: Secondary | ICD-10-CM | POA: Diagnosis not present

## 2015-11-26 DIAGNOSIS — N183 Chronic kidney disease, stage 3 (moderate): Secondary | ICD-10-CM | POA: Diagnosis not present

## 2015-11-26 DIAGNOSIS — I69354 Hemiplegia and hemiparesis following cerebral infarction affecting left non-dominant side: Secondary | ICD-10-CM | POA: Diagnosis not present

## 2015-11-26 DIAGNOSIS — E785 Hyperlipidemia, unspecified: Secondary | ICD-10-CM | POA: Diagnosis not present

## 2015-11-26 DIAGNOSIS — J45909 Unspecified asthma, uncomplicated: Secondary | ICD-10-CM | POA: Diagnosis not present

## 2015-11-26 DIAGNOSIS — Z87440 Personal history of urinary (tract) infections: Secondary | ICD-10-CM | POA: Diagnosis not present

## 2015-11-26 DIAGNOSIS — I69351 Hemiplegia and hemiparesis following cerebral infarction affecting right dominant side: Secondary | ICD-10-CM | POA: Diagnosis not present

## 2015-11-28 DIAGNOSIS — I69351 Hemiplegia and hemiparesis following cerebral infarction affecting right dominant side: Secondary | ICD-10-CM | POA: Diagnosis not present

## 2015-11-28 DIAGNOSIS — J45909 Unspecified asthma, uncomplicated: Secondary | ICD-10-CM | POA: Diagnosis not present

## 2015-11-28 DIAGNOSIS — Z87440 Personal history of urinary (tract) infections: Secondary | ICD-10-CM | POA: Diagnosis not present

## 2015-11-28 DIAGNOSIS — N183 Chronic kidney disease, stage 3 (moderate): Secondary | ICD-10-CM | POA: Diagnosis not present

## 2015-11-28 DIAGNOSIS — I69354 Hemiplegia and hemiparesis following cerebral infarction affecting left non-dominant side: Secondary | ICD-10-CM | POA: Diagnosis not present

## 2015-11-28 DIAGNOSIS — E785 Hyperlipidemia, unspecified: Secondary | ICD-10-CM | POA: Diagnosis not present

## 2015-11-29 ENCOUNTER — Ambulatory Visit (HOSPITAL_BASED_OUTPATIENT_CLINIC_OR_DEPARTMENT_OTHER): Payer: Medicare Other | Admitting: Physical Medicine & Rehabilitation

## 2015-11-29 ENCOUNTER — Encounter: Payer: Medicare Other | Attending: Physical Medicine & Rehabilitation

## 2015-11-29 ENCOUNTER — Encounter: Payer: Self-pay | Admitting: Physical Medicine & Rehabilitation

## 2015-11-29 VITALS — BP 111/64 | HR 73 | Resp 14

## 2015-11-29 DIAGNOSIS — I69398 Other sequelae of cerebral infarction: Secondary | ICD-10-CM | POA: Insufficient documentation

## 2015-11-29 DIAGNOSIS — R270 Ataxia, unspecified: Secondary | ICD-10-CM | POA: Diagnosis not present

## 2015-11-29 DIAGNOSIS — G5691 Unspecified mononeuropathy of right upper limb: Secondary | ICD-10-CM

## 2015-11-29 DIAGNOSIS — I517 Cardiomegaly: Secondary | ICD-10-CM | POA: Insufficient documentation

## 2015-11-29 DIAGNOSIS — I872 Venous insufficiency (chronic) (peripheral): Secondary | ICD-10-CM | POA: Diagnosis not present

## 2015-11-29 DIAGNOSIS — J45909 Unspecified asthma, uncomplicated: Secondary | ICD-10-CM | POA: Insufficient documentation

## 2015-11-29 DIAGNOSIS — M5126 Other intervertebral disc displacement, lumbar region: Secondary | ICD-10-CM | POA: Diagnosis not present

## 2015-11-29 DIAGNOSIS — M129 Arthropathy, unspecified: Secondary | ICD-10-CM | POA: Diagnosis not present

## 2015-11-29 DIAGNOSIS — I639 Cerebral infarction, unspecified: Secondary | ICD-10-CM | POA: Diagnosis not present

## 2015-11-29 DIAGNOSIS — R269 Unspecified abnormalities of gait and mobility: Secondary | ICD-10-CM | POA: Insufficient documentation

## 2015-11-29 DIAGNOSIS — E785 Hyperlipidemia, unspecified: Secondary | ICD-10-CM | POA: Insufficient documentation

## 2015-11-29 DIAGNOSIS — M19019 Primary osteoarthritis, unspecified shoulder: Secondary | ICD-10-CM

## 2015-11-29 DIAGNOSIS — M4712 Other spondylosis with myelopathy, cervical region: Secondary | ICD-10-CM

## 2015-11-29 DIAGNOSIS — M792 Neuralgia and neuritis, unspecified: Secondary | ICD-10-CM | POA: Insufficient documentation

## 2015-11-29 NOTE — Progress Notes (Signed)
Subjective:    Patient ID: Darlene Murray, female    DOB: 02-Jun-1922, 82 y.o.   MRN: [id number]  HPI  D/C from [place] last week.  She still requires assistance for bathing and dressing and grooming. She is starting to put some makeup on. She has walked 13 steps with the home health therapist. She does not walk with her husband's assistance. She is currently receiving home health PT and OT and speech therapy. Her husband appreciates the help for dressing and bathing. He may hire a caregiver once home health discontinues to help with bathing. No falls.  Chief complaint is right shoulder pain. She has previously had a diagnostic ultrasound of the right shoulder demonstrating right acromioclavicular arthropathy, right rotator cuff arthropathy in the region of the supraspinatus tendon as well as fluid surrounding the right long head of the biceps tendon.  She had a right subacromial injection approximately one month ago which was not helpful. In February 2017 the patient underwent a right acromioclavicular injection under ultrasound guidance.  Pain Inventory Average Pain 10 Pain Right Now NA My pain is sharp  In the last 24 hours, has pain interfered with the following? General activity 1 Relation with others 0 Enjoyment of life 1 What TIME of day is your pain at its worst? NA Sleep (in general) NA  Pain is worse with: bending Pain improves with: NA Relief from Meds: NA  Mobility ability to climb steps?  no do you drive?  no use a wheelchair needs help with transfers Do you have any goals in this area?  yes  Function retired I need assistance with the following:  dressing, bathing, toileting, meal prep, household duties and shopping  Neuro/Psych bladder control problems weakness trouble walking  Prior Studies Any changes since last visit?  no  Physicians involved in your care Any changes since last visit?  no   Family History  Problem Relation Age of Onset  . Heart  attack Sister   . Alzheimer's disease Sister   . Asthma Son    Social History   Social History  . Marital Status: Married    Spouse Name: N/A  . Number of Children: 3  . Years of Education: College   Occupational History  . Retired     Pharmacist, hospital   Social History Main Topics  . Smoking status: Passive Smoke Exposure - Never Smoker  . Smokeless tobacco: Never Used     Comment: Prior exposure through husband.   . Alcohol Use: No  . Drug Use: No  . Sexual Activity:    Partners: Female   Other Topics Concern  . None   Social History Narrative   Lives at home with her husband.   Right-handed.   1 cup coffee per day.      [facility]:   Originally from [facility]. She has previously lived in [place]. Previously traveled to [place]. No pets currently. No prior bird exposure. Previously worked as a Education officer, museum. No mold or hot tub exposure.    Past Surgical History  Procedure Laterality Date  . Cataract extraction    . Colonoscopy     Past Medical History  Diagnosis Date  . Blindness     Legally blind, right eye  . Asthmatic bronchitis   . Asthma     Moderate, persistent  . Fibrocystic breast disease   . Allergic rhinitis   . Hyperlipidemia   . Venous insufficiency     lower extremities  . HNP (herniated  nucleus pulposus), lumbar     L4-L5  . Left ventricular hypertrophy 2008    mild  . Antibiotic-associated diarrhea   . Stroke (cerebrum) ([place])   . Physical deconditioning   . Right-sided lacunar infarction ([place])   . Acute left hemiparesis ([place])   . Right sided weakness   . Cough   . Protein calorie malnutrition (HCC)    BP 111/64 mmHg  Pulse 73  Resp 14  SpO2 98%  Opioid Risk Score:   Fall Risk Score:  `1  Depression screen PHQ 2/9  Depression screen [facility] 2/9 10/05/2015 05/17/2015  Decreased Interest 0 0  Down, Depressed, Hopeless 0 0  PHQ - 2 Score 0 0  Altered sleeping - 0  Tired, decreased energy - 0  Change in appetite - 0  Feeling bad or  failure about yourself  - 0  Trouble concentrating - 0  Moving slowly or fidgety/restless - 0  Suicidal thoughts - 0  PHQ-9 Score - 0  Difficult doing work/chores - Not difficult at all     Review of Systems  Constitutional:       Bladder control problems   Respiratory: Positive for wheezing.   Cardiovascular:       Limb swelling   Musculoskeletal: Positive for gait problem.  Neurological: Positive for weakness.  All other systems reviewed and are negative.      Objective:   Physical Exam  Constitutional: She appears well-developed and well-nourished.  HENT:  Head: Normocephalic and atraumatic.  Eyes: Conjunctivae and EOM are normal. Pupils are equal, round, and reactive to light.  Neck: Normal range of motion.  Musculoskeletal:       Right shoulder: She exhibits decreased range of motion, tenderness, bony tenderness and decreased strength. She exhibits no swelling, no effusion and no deformity.  Patient is limited to approximately 60 of abduction. She has pain with passive range of motion. Pain with external rotation as well. Tenderness over the right acromioclavicular joint as well as the right bicipital groove,  Neurological: She is alert.  Aphasic, Limited conversation, phrase level response  Psychiatric: She has a normal mood and affect.  Nursing note and vitals reviewed.  Motor strength is 5 in the left deltoid, biceps, triceps, grip 4 in the right biceps triceps and grip but 3 minus at the deltoid due to pain and restricted range of motion Lower extremities are 4 minus at the knee extensors hip flexors are 3 minus ankle dorsiflexors are 3 minus Sensation reduced pinprick right C5 dermatome distribution       Assessment & Plan:  1. Right shoulder pain likely multifactorial. She has severe cervical stenosis which affects the C5 nerve root. Her exam has decreased sensation over the right C5 distribution but intact sensation at the C6 distribution.  In addition her  musculoskeletal ultrasound of the right shoulder demonstrates bicipital tendinitis, acromioclavicular joint arthropathy as well as rotator cuff arthropathy.  She has had good results with acromioclavicular joint injection under ultrasound but very meager benefits from the right subacromial bursa injection  Last month.  She may be a good candidate for percutaneous nerve stimulation, Given the complex nature of her pain as well as the chronicity  2. 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

## 2015-11-30 DIAGNOSIS — I69354 Hemiplegia and hemiparesis following cerebral infarction affecting left non-dominant side: Secondary | ICD-10-CM | POA: Diagnosis not present

## 2015-11-30 DIAGNOSIS — Z87440 Personal history of urinary (tract) infections: Secondary | ICD-10-CM | POA: Diagnosis not present

## 2015-11-30 DIAGNOSIS — E785 Hyperlipidemia, unspecified: Secondary | ICD-10-CM | POA: Diagnosis not present

## 2015-11-30 DIAGNOSIS — I69351 Hemiplegia and hemiparesis following cerebral infarction affecting right dominant side: Secondary | ICD-10-CM | POA: Diagnosis not present

## 2015-11-30 DIAGNOSIS — N183 Chronic kidney disease, stage 3 (moderate): Secondary | ICD-10-CM | POA: Diagnosis not present

## 2015-11-30 DIAGNOSIS — J45909 Unspecified asthma, uncomplicated: Secondary | ICD-10-CM | POA: Diagnosis not present

## 2015-12-03 DIAGNOSIS — I69351 Hemiplegia and hemiparesis following cerebral infarction affecting right dominant side: Secondary | ICD-10-CM | POA: Diagnosis not present

## 2015-12-03 DIAGNOSIS — N183 Chronic kidney disease, stage 3 (moderate): Secondary | ICD-10-CM | POA: Diagnosis not present

## 2015-12-03 DIAGNOSIS — J45909 Unspecified asthma, uncomplicated: Secondary | ICD-10-CM | POA: Diagnosis not present

## 2015-12-03 DIAGNOSIS — Z87440 Personal history of urinary (tract) infections: Secondary | ICD-10-CM | POA: Diagnosis not present

## 2015-12-03 DIAGNOSIS — E785 Hyperlipidemia, unspecified: Secondary | ICD-10-CM | POA: Diagnosis not present

## 2015-12-03 DIAGNOSIS — I69354 Hemiplegia and hemiparesis following cerebral infarction affecting left non-dominant side: Secondary | ICD-10-CM | POA: Diagnosis not present

## 2015-12-04 DIAGNOSIS — J45909 Unspecified asthma, uncomplicated: Secondary | ICD-10-CM | POA: Diagnosis not present

## 2015-12-04 DIAGNOSIS — E785 Hyperlipidemia, unspecified: Secondary | ICD-10-CM | POA: Diagnosis not present

## 2015-12-04 DIAGNOSIS — I69354 Hemiplegia and hemiparesis following cerebral infarction affecting left non-dominant side: Secondary | ICD-10-CM | POA: Diagnosis not present

## 2015-12-04 DIAGNOSIS — I69351 Hemiplegia and hemiparesis following cerebral infarction affecting right dominant side: Secondary | ICD-10-CM | POA: Diagnosis not present

## 2015-12-04 DIAGNOSIS — Z87440 Personal history of urinary (tract) infections: Secondary | ICD-10-CM | POA: Diagnosis not present

## 2015-12-04 DIAGNOSIS — N183 Chronic kidney disease, stage 3 (moderate): Secondary | ICD-10-CM | POA: Diagnosis not present

## 2015-12-05 ENCOUNTER — Ambulatory Visit (INDEPENDENT_AMBULATORY_CARE_PROVIDER_SITE_OTHER): Payer: Medicare Other | Admitting: Podiatry

## 2015-12-05 ENCOUNTER — Encounter: Payer: Self-pay | Admitting: Podiatry

## 2015-12-05 DIAGNOSIS — E785 Hyperlipidemia, unspecified: Secondary | ICD-10-CM | POA: Diagnosis not present

## 2015-12-05 DIAGNOSIS — M79676 Pain in unspecified toe(s): Secondary | ICD-10-CM

## 2015-12-05 DIAGNOSIS — B351 Tinea unguium: Secondary | ICD-10-CM | POA: Diagnosis not present

## 2015-12-05 DIAGNOSIS — N183 Chronic kidney disease, stage 3 (moderate): Secondary | ICD-10-CM | POA: Diagnosis not present

## 2015-12-05 DIAGNOSIS — I69351 Hemiplegia and hemiparesis following cerebral infarction affecting right dominant side: Secondary | ICD-10-CM | POA: Diagnosis not present

## 2015-12-05 DIAGNOSIS — J45909 Unspecified asthma, uncomplicated: Secondary | ICD-10-CM | POA: Diagnosis not present

## 2015-12-05 DIAGNOSIS — I69354 Hemiplegia and hemiparesis following cerebral infarction affecting left non-dominant side: Secondary | ICD-10-CM | POA: Diagnosis not present

## 2015-12-05 DIAGNOSIS — Z87440 Personal history of urinary (tract) infections: Secondary | ICD-10-CM | POA: Diagnosis not present

## 2015-12-05 NOTE — Progress Notes (Signed)
   Subjective:    Patient ID: Darlene Murray, female    DOB: 1922-01-04, 82 y.o.   MRN: [id number]  HPIThis patient presents to the office with long thick incurvated nails.  She has history of CVA and is in a wheelchair.  She says her nails are painful wearing her shoes.  She presents for preventive foot care services.  The patient presents here today for  Toenail trim.   Review of Systems  All other systems reviewed and are negative.      Objective:   Physical Exam GENERAL APPEARANCE: Alert, conversant. Appropriately groomed. No acute distress.  VASCULAR: Pedal pulses palpable at  [facility] and PT bilateral.  Capillary refill time is immediate to all digits,  Normal temperature gradient.  Digital hair growth is present bilateral  NEUROLOGIC: sensation is normal to 5.07 monofilament at 5/5 sites bilateral.  Light touch is intact bilateral, Muscle strength normal.  MUSCULOSKELETAL: acceptable muscle strength, tone and stability bilateral.  Intrinsic muscluature intact bilateral.  Rectus appearance of foot and digits noted bilateral.   DERMATOLOGIC: skin color, texture, and turgor are within normal limits.  No preulcerative lesions or ulcers  are seen, no interdigital maceration noted.  No open lesions present.  . No drainage noted.  NAILS  Thick disfigured pincer toenails both feet.         Assessment & Plan:  Onychomycosis  B/L  Debridement of nails.  RTC 10 weeks.   Gardiner Barefoot DPM

## 2015-12-07 DIAGNOSIS — E785 Hyperlipidemia, unspecified: Secondary | ICD-10-CM | POA: Diagnosis not present

## 2015-12-07 DIAGNOSIS — I69354 Hemiplegia and hemiparesis following cerebral infarction affecting left non-dominant side: Secondary | ICD-10-CM | POA: Diagnosis not present

## 2015-12-07 DIAGNOSIS — I69351 Hemiplegia and hemiparesis following cerebral infarction affecting right dominant side: Secondary | ICD-10-CM | POA: Diagnosis not present

## 2015-12-07 DIAGNOSIS — J45909 Unspecified asthma, uncomplicated: Secondary | ICD-10-CM | POA: Diagnosis not present

## 2015-12-07 DIAGNOSIS — Z87440 Personal history of urinary (tract) infections: Secondary | ICD-10-CM | POA: Diagnosis not present

## 2015-12-07 DIAGNOSIS — N183 Chronic kidney disease, stage 3 (moderate): Secondary | ICD-10-CM | POA: Diagnosis not present

## 2015-12-10 ENCOUNTER — Encounter: Payer: Self-pay | Admitting: Physical Medicine & Rehabilitation

## 2015-12-10 ENCOUNTER — Ambulatory Visit (HOSPITAL_BASED_OUTPATIENT_CLINIC_OR_DEPARTMENT_OTHER): Payer: Medicare Other | Admitting: Physical Medicine & Rehabilitation

## 2015-12-10 ENCOUNTER — Encounter: Payer: Medicare Other | Attending: Physical Medicine & Rehabilitation

## 2015-12-10 VITALS — BP 126/55 | HR 72 | Resp 14

## 2015-12-10 DIAGNOSIS — M129 Arthropathy, unspecified: Secondary | ICD-10-CM | POA: Diagnosis not present

## 2015-12-10 DIAGNOSIS — I69354 Hemiplegia and hemiparesis following cerebral infarction affecting left non-dominant side: Secondary | ICD-10-CM | POA: Diagnosis not present

## 2015-12-10 DIAGNOSIS — M5126 Other intervertebral disc displacement, lumbar region: Secondary | ICD-10-CM | POA: Insufficient documentation

## 2015-12-10 DIAGNOSIS — I517 Cardiomegaly: Secondary | ICD-10-CM | POA: Diagnosis not present

## 2015-12-10 DIAGNOSIS — R269 Unspecified abnormalities of gait and mobility: Secondary | ICD-10-CM | POA: Insufficient documentation

## 2015-12-10 DIAGNOSIS — I69398 Other sequelae of cerebral infarction: Secondary | ICD-10-CM | POA: Insufficient documentation

## 2015-12-10 DIAGNOSIS — J45909 Unspecified asthma, uncomplicated: Secondary | ICD-10-CM | POA: Insufficient documentation

## 2015-12-10 DIAGNOSIS — I872 Venous insufficiency (chronic) (peripheral): Secondary | ICD-10-CM | POA: Insufficient documentation

## 2015-12-10 DIAGNOSIS — E785 Hyperlipidemia, unspecified: Secondary | ICD-10-CM | POA: Diagnosis not present

## 2015-12-10 DIAGNOSIS — I69351 Hemiplegia and hemiparesis following cerebral infarction affecting right dominant side: Secondary | ICD-10-CM | POA: Diagnosis not present

## 2015-12-10 DIAGNOSIS — N183 Chronic kidney disease, stage 3 (moderate): Secondary | ICD-10-CM | POA: Diagnosis not present

## 2015-12-10 DIAGNOSIS — M19019 Primary osteoarthritis, unspecified shoulder: Secondary | ICD-10-CM

## 2015-12-10 DIAGNOSIS — R270 Ataxia, unspecified: Secondary | ICD-10-CM | POA: Diagnosis not present

## 2015-12-10 DIAGNOSIS — Z87440 Personal history of urinary (tract) infections: Secondary | ICD-10-CM | POA: Diagnosis not present

## 2015-12-10 NOTE — Progress Notes (Signed)
Right Acromioclavicular joint injection under ultrasound guidance  Indication is for anterior shoulder pain with imaging studies demonstrating acromioclavicular joint arthropathy Pain is only partially responsive to medication management and other conservative care. Pain interferes with ADLs and sleep  The patient was placed in a seated position the acromioclavicular joint was scanned in the long axis view, areas marked prepped with Betadine, sterile technique was utilized. Under direct long axis view 1% lidocaine was infiltrated to the skin and subcutaneous tissues. A 25-gauge 1.5 inch needle reached the acromioclavicular joint space. Then a solution containing 1 mL of lidocaine 1% and 0.5 ML of Celestone 6 mg per mL was injected. Patient tolerated procedure well. Images were saved. Postprocedure instructions given 

## 2015-12-10 NOTE — Patient Instructions (Signed)
If back pain starts shooting down the right leg would consider repeat epidural injection  For now you can use a heating pad 20 minutes every 2 hours as needed.  Also you may try some BenGay or similar cream

## 2015-12-11 DIAGNOSIS — I69354 Hemiplegia and hemiparesis following cerebral infarction affecting left non-dominant side: Secondary | ICD-10-CM | POA: Diagnosis not present

## 2015-12-11 DIAGNOSIS — J45909 Unspecified asthma, uncomplicated: Secondary | ICD-10-CM | POA: Diagnosis not present

## 2015-12-11 DIAGNOSIS — Z87440 Personal history of urinary (tract) infections: Secondary | ICD-10-CM | POA: Diagnosis not present

## 2015-12-11 DIAGNOSIS — N183 Chronic kidney disease, stage 3 (moderate): Secondary | ICD-10-CM | POA: Diagnosis not present

## 2015-12-11 DIAGNOSIS — I69351 Hemiplegia and hemiparesis following cerebral infarction affecting right dominant side: Secondary | ICD-10-CM | POA: Diagnosis not present

## 2015-12-11 DIAGNOSIS — E785 Hyperlipidemia, unspecified: Secondary | ICD-10-CM | POA: Diagnosis not present

## 2015-12-12 DIAGNOSIS — E7800 Pure hypercholesterolemia, unspecified: Secondary | ICD-10-CM | POA: Diagnosis not present

## 2015-12-12 DIAGNOSIS — Z87440 Personal history of urinary (tract) infections: Secondary | ICD-10-CM | POA: Diagnosis not present

## 2015-12-12 DIAGNOSIS — I69354 Hemiplegia and hemiparesis following cerebral infarction affecting left non-dominant side: Secondary | ICD-10-CM | POA: Diagnosis not present

## 2015-12-12 DIAGNOSIS — N183 Chronic kidney disease, stage 3 (moderate): Secondary | ICD-10-CM | POA: Diagnosis not present

## 2015-12-12 DIAGNOSIS — I10 Essential (primary) hypertension: Secondary | ICD-10-CM | POA: Diagnosis not present

## 2015-12-12 DIAGNOSIS — Z8679 Personal history of other diseases of the circulatory system: Secondary | ICD-10-CM | POA: Diagnosis not present

## 2015-12-12 DIAGNOSIS — E785 Hyperlipidemia, unspecified: Secondary | ICD-10-CM | POA: Diagnosis not present

## 2015-12-12 DIAGNOSIS — J45909 Unspecified asthma, uncomplicated: Secondary | ICD-10-CM | POA: Diagnosis not present

## 2015-12-12 DIAGNOSIS — I69351 Hemiplegia and hemiparesis following cerebral infarction affecting right dominant side: Secondary | ICD-10-CM | POA: Diagnosis not present

## 2015-12-13 ENCOUNTER — Ambulatory Visit (INDEPENDENT_AMBULATORY_CARE_PROVIDER_SITE_OTHER): Payer: Medicare Other | Admitting: Pulmonary Disease

## 2015-12-13 ENCOUNTER — Encounter: Payer: Self-pay | Admitting: Pulmonary Disease

## 2015-12-13 VITALS — BP 104/66 | HR 84

## 2015-12-13 DIAGNOSIS — I69351 Hemiplegia and hemiparesis following cerebral infarction affecting right dominant side: Secondary | ICD-10-CM | POA: Diagnosis not present

## 2015-12-13 DIAGNOSIS — N183 Chronic kidney disease, stage 3 (moderate): Secondary | ICD-10-CM | POA: Diagnosis not present

## 2015-12-13 DIAGNOSIS — J45909 Unspecified asthma, uncomplicated: Secondary | ICD-10-CM | POA: Diagnosis not present

## 2015-12-13 DIAGNOSIS — E785 Hyperlipidemia, unspecified: Secondary | ICD-10-CM | POA: Diagnosis not present

## 2015-12-13 DIAGNOSIS — I639 Cerebral infarction, unspecified: Secondary | ICD-10-CM

## 2015-12-13 DIAGNOSIS — I69354 Hemiplegia and hemiparesis following cerebral infarction affecting left non-dominant side: Secondary | ICD-10-CM | POA: Diagnosis not present

## 2015-12-13 DIAGNOSIS — J4550 Severe persistent asthma, uncomplicated: Secondary | ICD-10-CM | POA: Diagnosis not present

## 2015-12-13 DIAGNOSIS — Z87440 Personal history of urinary (tract) infections: Secondary | ICD-10-CM | POA: Diagnosis not present

## 2015-12-13 NOTE — Progress Notes (Signed)
   Subjective:    Patient ID: Darlene Murray, female    DOB: 07/07/1922, 82 y.o.   MRN: [id number]  HPI   83 year old with severe persistent asthma, frequent exacerbations, steroid dependence. She is a patient of Dr. Ashok Cordia.   Chief Complaint  Patient presents with  . Acute Visit    Coughing up clear mucus, breathing is doing fine.  no other concerns.   Unfortunately she had several strokes in the interim and is now wheelchair bound needing assistance for all activities. She is accompanied by her husband who is her primary caregiver. She had increasing cough for 2 days but this seems to have improved now, clear sputum, no wheezing or increasing dyspnea She has no swallowing issues and no cough after eating  She is on 5 mg of prednisone She is compliant with Dulera through a spacer  Past Medical History  Diagnosis Date  . Blindness     Legally blind, right eye  . Asthmatic bronchitis   . Asthma     Moderate, persistent  . Fibrocystic breast disease   . Allergic rhinitis   . Hyperlipidemia   . Venous insufficiency     lower extremities  . HNP (herniated nucleus pulposus), lumbar     L4-L5  . Left ventricular hypertrophy 2008    mild  . Antibiotic-associated diarrhea   . Stroke (cerebrum) ([place])   . Physical deconditioning   . Right-sided lacunar infarction ([place])   . Acute left hemiparesis ([place])   . Right sided weakness   . Cough   . Protein calorie malnutrition ([place])      Review of Systems Patient denies significant dyspnea,cough, hemoptysis,  chest pain, palpitations, pedal edema, orthopnea, paroxysmal nocturnal dyspnea, lightheadedness, nausea, vomiting, abdominal or  leg pains      Objective:   Physical Exam  Gen. Pleasant, well-nourished, in no distress ENT - no lesions, no post nasal drip Neck: No JVD, no thyromegaly, no carotid bruits Lungs: no use of accessory muscles, no dullness to percussion, clear without rales or rhonchi  Cardiovascular: Rhythm  regular, heart sounds  normal, no murmurs or gallops, no peripheral edema Musculoskeletal: No deformities, no cyanosis or clubbing        Assessment & Plan:

## 2015-12-13 NOTE — Patient Instructions (Signed)
Prednisone 10 mg tabs  Take 2 tabs daily with food x 5ds, then 1 tab daily with food x 5ds then back to usual dose of 5 mg daily  Take Delsym as needed for cough Call if worse

## 2015-12-13 NOTE — Assessment & Plan Note (Signed)
Prednisone 10 mg tabs  Take 2 tabs daily with food x 5ds, then 1 tab daily with food x 5ds then back to usual dose of 5 mg daily  Take Delsym as needed for cough Call if worse

## 2015-12-14 DIAGNOSIS — N183 Chronic kidney disease, stage 3 (moderate): Secondary | ICD-10-CM | POA: Diagnosis not present

## 2015-12-14 DIAGNOSIS — I69351 Hemiplegia and hemiparesis following cerebral infarction affecting right dominant side: Secondary | ICD-10-CM | POA: Diagnosis not present

## 2015-12-14 DIAGNOSIS — J45909 Unspecified asthma, uncomplicated: Secondary | ICD-10-CM | POA: Diagnosis not present

## 2015-12-14 DIAGNOSIS — E785 Hyperlipidemia, unspecified: Secondary | ICD-10-CM | POA: Diagnosis not present

## 2015-12-14 DIAGNOSIS — I69354 Hemiplegia and hemiparesis following cerebral infarction affecting left non-dominant side: Secondary | ICD-10-CM | POA: Diagnosis not present

## 2015-12-14 DIAGNOSIS — Z87440 Personal history of urinary (tract) infections: Secondary | ICD-10-CM | POA: Diagnosis not present

## 2015-12-17 ENCOUNTER — Telehealth: Payer: Self-pay | Admitting: Pulmonary Disease

## 2015-12-17 DIAGNOSIS — Z87440 Personal history of urinary (tract) infections: Secondary | ICD-10-CM | POA: Diagnosis not present

## 2015-12-17 DIAGNOSIS — I69351 Hemiplegia and hemiparesis following cerebral infarction affecting right dominant side: Secondary | ICD-10-CM | POA: Diagnosis not present

## 2015-12-17 DIAGNOSIS — N183 Chronic kidney disease, stage 3 (moderate): Secondary | ICD-10-CM | POA: Diagnosis not present

## 2015-12-17 DIAGNOSIS — E785 Hyperlipidemia, unspecified: Secondary | ICD-10-CM | POA: Diagnosis not present

## 2015-12-17 DIAGNOSIS — I69354 Hemiplegia and hemiparesis following cerebral infarction affecting left non-dominant side: Secondary | ICD-10-CM | POA: Diagnosis not present

## 2015-12-17 DIAGNOSIS — J45909 Unspecified asthma, uncomplicated: Secondary | ICD-10-CM | POA: Diagnosis not present

## 2015-12-17 MED ORDER — PREDNISONE 5 MG PO TABS: 5.0000 mg | ORAL_TABLET | Freq: Every day | ORAL | Status: DC

## 2015-12-17 NOTE — Telephone Encounter (Signed)
Spoke with pt's husband. She needs a refill on her maintenance prednisone. This has been sent in. Nothing further was needed.

## 2015-12-18 DIAGNOSIS — Z87440 Personal history of urinary (tract) infections: Secondary | ICD-10-CM | POA: Diagnosis not present

## 2015-12-18 DIAGNOSIS — I69351 Hemiplegia and hemiparesis following cerebral infarction affecting right dominant side: Secondary | ICD-10-CM | POA: Diagnosis not present

## 2015-12-18 DIAGNOSIS — I69354 Hemiplegia and hemiparesis following cerebral infarction affecting left non-dominant side: Secondary | ICD-10-CM | POA: Diagnosis not present

## 2015-12-18 DIAGNOSIS — I129 Hypertensive chronic kidney disease with stage 1 through stage 4 chronic kidney disease, or unspecified chronic kidney disease: Secondary | ICD-10-CM | POA: Diagnosis not present

## 2015-12-18 DIAGNOSIS — I69320 Aphasia following cerebral infarction: Secondary | ICD-10-CM | POA: Diagnosis not present

## 2015-12-18 DIAGNOSIS — E785 Hyperlipidemia, unspecified: Secondary | ICD-10-CM | POA: Diagnosis not present

## 2015-12-18 DIAGNOSIS — J45909 Unspecified asthma, uncomplicated: Secondary | ICD-10-CM | POA: Diagnosis not present

## 2015-12-18 DIAGNOSIS — N183 Chronic kidney disease, stage 3 (moderate): Secondary | ICD-10-CM | POA: Diagnosis not present

## 2015-12-18 DIAGNOSIS — I69311 Memory deficit following cerebral infarction: Secondary | ICD-10-CM | POA: Diagnosis not present

## 2015-12-19 DIAGNOSIS — I69311 Memory deficit following cerebral infarction: Secondary | ICD-10-CM | POA: Diagnosis not present

## 2015-12-19 DIAGNOSIS — N183 Chronic kidney disease, stage 3 (moderate): Secondary | ICD-10-CM | POA: Diagnosis not present

## 2015-12-19 DIAGNOSIS — I69320 Aphasia following cerebral infarction: Secondary | ICD-10-CM | POA: Diagnosis not present

## 2015-12-19 DIAGNOSIS — I69354 Hemiplegia and hemiparesis following cerebral infarction affecting left non-dominant side: Secondary | ICD-10-CM | POA: Diagnosis not present

## 2015-12-19 DIAGNOSIS — I69351 Hemiplegia and hemiparesis following cerebral infarction affecting right dominant side: Secondary | ICD-10-CM | POA: Diagnosis not present

## 2015-12-19 DIAGNOSIS — I129 Hypertensive chronic kidney disease with stage 1 through stage 4 chronic kidney disease, or unspecified chronic kidney disease: Secondary | ICD-10-CM | POA: Diagnosis not present

## 2015-12-20 DIAGNOSIS — I129 Hypertensive chronic kidney disease with stage 1 through stage 4 chronic kidney disease, or unspecified chronic kidney disease: Secondary | ICD-10-CM | POA: Diagnosis not present

## 2015-12-20 DIAGNOSIS — I69354 Hemiplegia and hemiparesis following cerebral infarction affecting left non-dominant side: Secondary | ICD-10-CM | POA: Diagnosis not present

## 2015-12-20 DIAGNOSIS — N183 Chronic kidney disease, stage 3 (moderate): Secondary | ICD-10-CM | POA: Diagnosis not present

## 2015-12-20 DIAGNOSIS — I69311 Memory deficit following cerebral infarction: Secondary | ICD-10-CM | POA: Diagnosis not present

## 2015-12-20 DIAGNOSIS — I69351 Hemiplegia and hemiparesis following cerebral infarction affecting right dominant side: Secondary | ICD-10-CM | POA: Diagnosis not present

## 2015-12-20 DIAGNOSIS — I69320 Aphasia following cerebral infarction: Secondary | ICD-10-CM | POA: Diagnosis not present

## 2015-12-21 DIAGNOSIS — I129 Hypertensive chronic kidney disease with stage 1 through stage 4 chronic kidney disease, or unspecified chronic kidney disease: Secondary | ICD-10-CM | POA: Diagnosis not present

## 2015-12-21 DIAGNOSIS — I69351 Hemiplegia and hemiparesis following cerebral infarction affecting right dominant side: Secondary | ICD-10-CM | POA: Diagnosis not present

## 2015-12-21 DIAGNOSIS — N183 Chronic kidney disease, stage 3 (moderate): Secondary | ICD-10-CM | POA: Diagnosis not present

## 2015-12-21 DIAGNOSIS — I69320 Aphasia following cerebral infarction: Secondary | ICD-10-CM | POA: Diagnosis not present

## 2015-12-21 DIAGNOSIS — I69311 Memory deficit following cerebral infarction: Secondary | ICD-10-CM | POA: Diagnosis not present

## 2015-12-21 DIAGNOSIS — I69354 Hemiplegia and hemiparesis following cerebral infarction affecting left non-dominant side: Secondary | ICD-10-CM | POA: Diagnosis not present

## 2015-12-24 DIAGNOSIS — N183 Chronic kidney disease, stage 3 (moderate): Secondary | ICD-10-CM | POA: Diagnosis not present

## 2015-12-24 DIAGNOSIS — I69354 Hemiplegia and hemiparesis following cerebral infarction affecting left non-dominant side: Secondary | ICD-10-CM | POA: Diagnosis not present

## 2015-12-24 DIAGNOSIS — I129 Hypertensive chronic kidney disease with stage 1 through stage 4 chronic kidney disease, or unspecified chronic kidney disease: Secondary | ICD-10-CM | POA: Diagnosis not present

## 2015-12-24 DIAGNOSIS — I69351 Hemiplegia and hemiparesis following cerebral infarction affecting right dominant side: Secondary | ICD-10-CM | POA: Diagnosis not present

## 2015-12-24 DIAGNOSIS — I69320 Aphasia following cerebral infarction: Secondary | ICD-10-CM | POA: Diagnosis not present

## 2015-12-24 DIAGNOSIS — I69311 Memory deficit following cerebral infarction: Secondary | ICD-10-CM | POA: Diagnosis not present

## 2015-12-25 DIAGNOSIS — N183 Chronic kidney disease, stage 3 (moderate): Secondary | ICD-10-CM | POA: Diagnosis not present

## 2015-12-25 DIAGNOSIS — I69354 Hemiplegia and hemiparesis following cerebral infarction affecting left non-dominant side: Secondary | ICD-10-CM | POA: Diagnosis not present

## 2015-12-25 DIAGNOSIS — I129 Hypertensive chronic kidney disease with stage 1 through stage 4 chronic kidney disease, or unspecified chronic kidney disease: Secondary | ICD-10-CM | POA: Diagnosis not present

## 2015-12-25 DIAGNOSIS — I69311 Memory deficit following cerebral infarction: Secondary | ICD-10-CM | POA: Diagnosis not present

## 2015-12-25 DIAGNOSIS — I69320 Aphasia following cerebral infarction: Secondary | ICD-10-CM | POA: Diagnosis not present

## 2015-12-25 DIAGNOSIS — I69351 Hemiplegia and hemiparesis following cerebral infarction affecting right dominant side: Secondary | ICD-10-CM | POA: Diagnosis not present

## 2015-12-26 DIAGNOSIS — N183 Chronic kidney disease, stage 3 (moderate): Secondary | ICD-10-CM | POA: Diagnosis not present

## 2015-12-26 DIAGNOSIS — I129 Hypertensive chronic kidney disease with stage 1 through stage 4 chronic kidney disease, or unspecified chronic kidney disease: Secondary | ICD-10-CM | POA: Diagnosis not present

## 2015-12-26 DIAGNOSIS — I69311 Memory deficit following cerebral infarction: Secondary | ICD-10-CM | POA: Diagnosis not present

## 2015-12-26 DIAGNOSIS — I69354 Hemiplegia and hemiparesis following cerebral infarction affecting left non-dominant side: Secondary | ICD-10-CM | POA: Diagnosis not present

## 2015-12-26 DIAGNOSIS — I69320 Aphasia following cerebral infarction: Secondary | ICD-10-CM | POA: Diagnosis not present

## 2015-12-26 DIAGNOSIS — I69351 Hemiplegia and hemiparesis following cerebral infarction affecting right dominant side: Secondary | ICD-10-CM | POA: Diagnosis not present

## 2015-12-27 ENCOUNTER — Other Ambulatory Visit: Payer: Self-pay

## 2015-12-27 DIAGNOSIS — Z79899 Other long term (current) drug therapy: Secondary | ICD-10-CM | POA: Diagnosis not present

## 2015-12-27 DIAGNOSIS — I69351 Hemiplegia and hemiparesis following cerebral infarction affecting right dominant side: Secondary | ICD-10-CM | POA: Diagnosis not present

## 2015-12-27 DIAGNOSIS — I69354 Hemiplegia and hemiparesis following cerebral infarction affecting left non-dominant side: Secondary | ICD-10-CM | POA: Diagnosis not present

## 2015-12-27 DIAGNOSIS — I69311 Memory deficit following cerebral infarction: Secondary | ICD-10-CM | POA: Diagnosis not present

## 2015-12-27 DIAGNOSIS — I69320 Aphasia following cerebral infarction: Secondary | ICD-10-CM | POA: Diagnosis not present

## 2015-12-27 DIAGNOSIS — I959 Hypotension, unspecified: Secondary | ICD-10-CM | POA: Diagnosis not present

## 2015-12-27 DIAGNOSIS — R8290 Unspecified abnormal findings in urine: Secondary | ICD-10-CM | POA: Diagnosis not present

## 2015-12-27 DIAGNOSIS — I129 Hypertensive chronic kidney disease with stage 1 through stage 4 chronic kidney disease, or unspecified chronic kidney disease: Secondary | ICD-10-CM | POA: Diagnosis not present

## 2015-12-27 DIAGNOSIS — I10 Essential (primary) hypertension: Secondary | ICD-10-CM | POA: Diagnosis not present

## 2015-12-27 DIAGNOSIS — N183 Chronic kidney disease, stage 3 (moderate): Secondary | ICD-10-CM | POA: Diagnosis not present

## 2015-12-27 MED ORDER — ASPIRIN-DIPYRIDAMOLE ER 25-200 MG PO CP12: 1.0000 | ORAL_CAPSULE | Freq: Two times a day (BID) | ORAL | Status: AC

## 2015-12-27 NOTE — Telephone Encounter (Signed)
90 day refills retailed per faxed request from pharmacy

## 2015-12-31 DIAGNOSIS — I129 Hypertensive chronic kidney disease with stage 1 through stage 4 chronic kidney disease, or unspecified chronic kidney disease: Secondary | ICD-10-CM | POA: Diagnosis not present

## 2015-12-31 DIAGNOSIS — I69351 Hemiplegia and hemiparesis following cerebral infarction affecting right dominant side: Secondary | ICD-10-CM | POA: Diagnosis not present

## 2015-12-31 DIAGNOSIS — I69320 Aphasia following cerebral infarction: Secondary | ICD-10-CM | POA: Diagnosis not present

## 2015-12-31 DIAGNOSIS — I69354 Hemiplegia and hemiparesis following cerebral infarction affecting left non-dominant side: Secondary | ICD-10-CM | POA: Diagnosis not present

## 2015-12-31 DIAGNOSIS — N183 Chronic kidney disease, stage 3 (moderate): Secondary | ICD-10-CM | POA: Diagnosis not present

## 2015-12-31 DIAGNOSIS — I69311 Memory deficit following cerebral infarction: Secondary | ICD-10-CM | POA: Diagnosis not present

## 2016-01-01 DIAGNOSIS — I69320 Aphasia following cerebral infarction: Secondary | ICD-10-CM | POA: Diagnosis not present

## 2016-01-01 DIAGNOSIS — I129 Hypertensive chronic kidney disease with stage 1 through stage 4 chronic kidney disease, or unspecified chronic kidney disease: Secondary | ICD-10-CM | POA: Diagnosis not present

## 2016-01-01 DIAGNOSIS — I69354 Hemiplegia and hemiparesis following cerebral infarction affecting left non-dominant side: Secondary | ICD-10-CM | POA: Diagnosis not present

## 2016-01-01 DIAGNOSIS — I69311 Memory deficit following cerebral infarction: Secondary | ICD-10-CM | POA: Diagnosis not present

## 2016-01-01 DIAGNOSIS — I69351 Hemiplegia and hemiparesis following cerebral infarction affecting right dominant side: Secondary | ICD-10-CM | POA: Diagnosis not present

## 2016-01-01 DIAGNOSIS — N183 Chronic kidney disease, stage 3 (moderate): Secondary | ICD-10-CM | POA: Diagnosis not present

## 2016-01-02 DIAGNOSIS — I129 Hypertensive chronic kidney disease with stage 1 through stage 4 chronic kidney disease, or unspecified chronic kidney disease: Secondary | ICD-10-CM | POA: Diagnosis not present

## 2016-01-02 DIAGNOSIS — I69351 Hemiplegia and hemiparesis following cerebral infarction affecting right dominant side: Secondary | ICD-10-CM | POA: Diagnosis not present

## 2016-01-02 DIAGNOSIS — I69320 Aphasia following cerebral infarction: Secondary | ICD-10-CM | POA: Diagnosis not present

## 2016-01-02 DIAGNOSIS — I69311 Memory deficit following cerebral infarction: Secondary | ICD-10-CM | POA: Diagnosis not present

## 2016-01-02 DIAGNOSIS — I69354 Hemiplegia and hemiparesis following cerebral infarction affecting left non-dominant side: Secondary | ICD-10-CM | POA: Diagnosis not present

## 2016-01-02 DIAGNOSIS — N183 Chronic kidney disease, stage 3 (moderate): Secondary | ICD-10-CM | POA: Diagnosis not present

## 2016-01-04 DIAGNOSIS — N183 Chronic kidney disease, stage 3 (moderate): Secondary | ICD-10-CM | POA: Diagnosis not present

## 2016-01-04 DIAGNOSIS — I69311 Memory deficit following cerebral infarction: Secondary | ICD-10-CM | POA: Diagnosis not present

## 2016-01-04 DIAGNOSIS — I69354 Hemiplegia and hemiparesis following cerebral infarction affecting left non-dominant side: Secondary | ICD-10-CM | POA: Diagnosis not present

## 2016-01-04 DIAGNOSIS — I69320 Aphasia following cerebral infarction: Secondary | ICD-10-CM | POA: Diagnosis not present

## 2016-01-04 DIAGNOSIS — I69351 Hemiplegia and hemiparesis following cerebral infarction affecting right dominant side: Secondary | ICD-10-CM | POA: Diagnosis not present

## 2016-01-04 DIAGNOSIS — I129 Hypertensive chronic kidney disease with stage 1 through stage 4 chronic kidney disease, or unspecified chronic kidney disease: Secondary | ICD-10-CM | POA: Diagnosis not present

## 2016-01-07 DIAGNOSIS — I69354 Hemiplegia and hemiparesis following cerebral infarction affecting left non-dominant side: Secondary | ICD-10-CM | POA: Diagnosis not present

## 2016-01-07 DIAGNOSIS — I69351 Hemiplegia and hemiparesis following cerebral infarction affecting right dominant side: Secondary | ICD-10-CM | POA: Diagnosis not present

## 2016-01-07 DIAGNOSIS — I69320 Aphasia following cerebral infarction: Secondary | ICD-10-CM | POA: Diagnosis not present

## 2016-01-07 DIAGNOSIS — N183 Chronic kidney disease, stage 3 (moderate): Secondary | ICD-10-CM | POA: Diagnosis not present

## 2016-01-07 DIAGNOSIS — I69311 Memory deficit following cerebral infarction: Secondary | ICD-10-CM | POA: Diagnosis not present

## 2016-01-07 DIAGNOSIS — I129 Hypertensive chronic kidney disease with stage 1 through stage 4 chronic kidney disease, or unspecified chronic kidney disease: Secondary | ICD-10-CM | POA: Diagnosis not present

## 2016-01-08 DIAGNOSIS — I129 Hypertensive chronic kidney disease with stage 1 through stage 4 chronic kidney disease, or unspecified chronic kidney disease: Secondary | ICD-10-CM | POA: Diagnosis not present

## 2016-01-08 DIAGNOSIS — N183 Chronic kidney disease, stage 3 (moderate): Secondary | ICD-10-CM | POA: Diagnosis not present

## 2016-01-08 DIAGNOSIS — I69311 Memory deficit following cerebral infarction: Secondary | ICD-10-CM | POA: Diagnosis not present

## 2016-01-08 DIAGNOSIS — I69354 Hemiplegia and hemiparesis following cerebral infarction affecting left non-dominant side: Secondary | ICD-10-CM | POA: Diagnosis not present

## 2016-01-08 DIAGNOSIS — I69351 Hemiplegia and hemiparesis following cerebral infarction affecting right dominant side: Secondary | ICD-10-CM | POA: Diagnosis not present

## 2016-01-08 DIAGNOSIS — I69320 Aphasia following cerebral infarction: Secondary | ICD-10-CM | POA: Diagnosis not present

## 2016-01-10 DIAGNOSIS — R269 Unspecified abnormalities of gait and mobility: Secondary | ICD-10-CM | POA: Diagnosis not present

## 2016-01-10 DIAGNOSIS — I69320 Aphasia following cerebral infarction: Secondary | ICD-10-CM | POA: Diagnosis not present

## 2016-01-10 DIAGNOSIS — I69351 Hemiplegia and hemiparesis following cerebral infarction affecting right dominant side: Secondary | ICD-10-CM | POA: Diagnosis not present

## 2016-01-10 DIAGNOSIS — I129 Hypertensive chronic kidney disease with stage 1 through stage 4 chronic kidney disease, or unspecified chronic kidney disease: Secondary | ICD-10-CM | POA: Diagnosis not present

## 2016-01-10 DIAGNOSIS — I6789 Other cerebrovascular disease: Secondary | ICD-10-CM | POA: Diagnosis not present

## 2016-01-10 DIAGNOSIS — N183 Chronic kidney disease, stage 3 (moderate): Secondary | ICD-10-CM | POA: Diagnosis not present

## 2016-01-10 DIAGNOSIS — I69311 Memory deficit following cerebral infarction: Secondary | ICD-10-CM | POA: Diagnosis not present

## 2016-01-10 DIAGNOSIS — I69354 Hemiplegia and hemiparesis following cerebral infarction affecting left non-dominant side: Secondary | ICD-10-CM | POA: Diagnosis not present

## 2016-01-11 DIAGNOSIS — I129 Hypertensive chronic kidney disease with stage 1 through stage 4 chronic kidney disease, or unspecified chronic kidney disease: Secondary | ICD-10-CM | POA: Diagnosis not present

## 2016-01-11 DIAGNOSIS — I69320 Aphasia following cerebral infarction: Secondary | ICD-10-CM | POA: Diagnosis not present

## 2016-01-11 DIAGNOSIS — N183 Chronic kidney disease, stage 3 (moderate): Secondary | ICD-10-CM | POA: Diagnosis not present

## 2016-01-11 DIAGNOSIS — I69354 Hemiplegia and hemiparesis following cerebral infarction affecting left non-dominant side: Secondary | ICD-10-CM | POA: Diagnosis not present

## 2016-01-11 DIAGNOSIS — I69311 Memory deficit following cerebral infarction: Secondary | ICD-10-CM | POA: Diagnosis not present

## 2016-01-11 DIAGNOSIS — I69351 Hemiplegia and hemiparesis following cerebral infarction affecting right dominant side: Secondary | ICD-10-CM | POA: Diagnosis not present

## 2016-01-14 ENCOUNTER — Ambulatory Visit (HOSPITAL_BASED_OUTPATIENT_CLINIC_OR_DEPARTMENT_OTHER): Payer: Medicare Other | Admitting: Physical Medicine & Rehabilitation

## 2016-01-14 ENCOUNTER — Encounter: Payer: Medicare Other | Attending: Physical Medicine & Rehabilitation

## 2016-01-14 ENCOUNTER — Encounter: Payer: Self-pay | Admitting: Physical Medicine & Rehabilitation

## 2016-01-14 VITALS — BP 113/70 | HR 68

## 2016-01-14 DIAGNOSIS — I872 Venous insufficiency (chronic) (peripheral): Secondary | ICD-10-CM | POA: Diagnosis not present

## 2016-01-14 DIAGNOSIS — G5691 Unspecified mononeuropathy of right upper limb: Secondary | ICD-10-CM | POA: Diagnosis not present

## 2016-01-14 DIAGNOSIS — E785 Hyperlipidemia, unspecified: Secondary | ICD-10-CM | POA: Insufficient documentation

## 2016-01-14 DIAGNOSIS — M792 Neuralgia and neuritis, unspecified: Secondary | ICD-10-CM

## 2016-01-14 DIAGNOSIS — I6930 Unspecified sequelae of cerebral infarction: Secondary | ICD-10-CM

## 2016-01-14 DIAGNOSIS — M19019 Primary osteoarthritis, unspecified shoulder: Secondary | ICD-10-CM

## 2016-01-14 DIAGNOSIS — R269 Unspecified abnormalities of gait and mobility: Secondary | ICD-10-CM | POA: Insufficient documentation

## 2016-01-14 DIAGNOSIS — J45909 Unspecified asthma, uncomplicated: Secondary | ICD-10-CM | POA: Diagnosis not present

## 2016-01-14 DIAGNOSIS — I517 Cardiomegaly: Secondary | ICD-10-CM | POA: Diagnosis not present

## 2016-01-14 DIAGNOSIS — I69359 Hemiplegia and hemiparesis following cerebral infarction affecting unspecified side: Secondary | ICD-10-CM

## 2016-01-14 DIAGNOSIS — M129 Arthropathy, unspecified: Secondary | ICD-10-CM | POA: Diagnosis not present

## 2016-01-14 DIAGNOSIS — I69398 Other sequelae of cerebral infarction: Secondary | ICD-10-CM

## 2016-01-14 DIAGNOSIS — I639 Cerebral infarction, unspecified: Secondary | ICD-10-CM

## 2016-01-14 DIAGNOSIS — M5126 Other intervertebral disc displacement, lumbar region: Secondary | ICD-10-CM | POA: Diagnosis not present

## 2016-01-14 DIAGNOSIS — R270 Ataxia, unspecified: Secondary | ICD-10-CM | POA: Insufficient documentation

## 2016-01-14 NOTE — Patient Instructions (Signed)
Work hard in therapy

## 2016-01-14 NOTE — Progress Notes (Signed)
Subjective:    Patient ID: Darlene Murray, female    DOB: May 22, 1922, 82 y.o.   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 She underwent inpatient rehabilitation at [facility]. She was rehospitalized in October for UTI. CT scan at that time demonstrated no new stroke. Has had additional imaging studies, 06/14/2015 cervical spine MRI demonstrating severe bilateral stenosis C4-5 and right C5-C6.  Also had right PLIC as well as thalamic infarcts in March 2017 and underwent additional physical medicine rehabilitation admission as well as transfer to skilled nursing facility. HPI Has finished Home health PT and OT and speech therapy Primary care physician is setting up outpatient therapy in [facility]. She is using a transportation service currently at cost $60 per trip to [facility]  Right shoulder pain resolved after right acromioclavicular joint injection with ultrasound guidance performed approximately 1 month ago Pain Inventory Average Pain 0 Pain Right Now 0 My pain is none  In the last 24 hours, has pain interfered with the following? General activity none Relation with others 0 Enjoyment of life 0 What TIME of day is your pain at its worst? no pain Sleep (in general) NA  Pain is worse with: none Pain improves with: none Relief from Meds: none  Mobility ability to climb steps?  no do you drive?  no use a wheelchair needs help with transfers Do you have any goals in this area?  yes  Function retired I need assistance with the following:  dressing, bathing, toileting, meal prep, household duties and shopping Do you have any goals in this area?  yes  Neuro/Psych bladder control problems tremor trouble walking  Prior Studies Any changes since last visit?  no  Physicians involved in your care Any changes since last visit?  no   Family History  Problem Relation Age of Onset  . Heart attack Sister   . Alzheimer's  disease Sister   . Asthma Son    Social History   Social History  . Marital Status: Married    Spouse Name: N/A  . Number of Children: 3  . Years of Education: College   Occupational History  . Retired     Pharmacist, hospital   Social History Main Topics  . Smoking status: Passive Smoke Exposure - Never Smoker  . Smokeless tobacco: Never Used     Comment: Prior exposure through husband.   . Alcohol Use: No  . Drug Use: No  . Sexual Activity:    Partners: Female   Other Topics Concern  . None   Social History Narrative   Lives at home with her husband.   Right-handed.   1 cup coffee per day.      [facility]:   Originally from [facility]. She has previously lived in [place]. Previously traveled to [place]. No pets currently. No prior bird exposure. Previously worked as a Education officer, museum. No mold or hot tub exposure.    Past Surgical History  Procedure Laterality Date  . Cataract extraction    . Colonoscopy     Past Medical History  Diagnosis Date  . Blindness     Legally blind, right eye  . Asthmatic bronchitis   . Asthma     Moderate, persistent  . Fibrocystic breast disease   . Allergic rhinitis   . Hyperlipidemia   . Venous insufficiency     lower extremities  . HNP (herniated nucleus pulposus), lumbar     L4-L5  . Left ventricular hypertrophy  2008    mild  . Antibiotic-associated diarrhea   . Stroke (cerebrum) ([place])   . Physical deconditioning   . Right-sided lacunar infarction ([place])   . Acute left hemiparesis ([place])   . Right sided weakness   . Cough   . Protein calorie malnutrition (HCC)    BP 113/70 mmHg  Pulse 68  SpO2 96%  Opioid Risk Score:   Fall Risk Score:  `1  Depression screen PHQ 2/9  Depression screen [facility] 2/9 10/05/2015 05/17/2015  Decreased Interest 0 0  Down, Depressed, Hopeless 0 0  PHQ - 2 Score 0 0  Altered sleeping - 0  Tired, decreased energy - 0  Change in appetite - 0  Feeling bad or failure about yourself  - 0  Trouble  concentrating - 0  Moving slowly or fidgety/restless - 0  Suicidal thoughts - 0  PHQ-9 Score - 0  Difficult doing work/chores - Not difficult at all     Review of Systems  Constitutional: Negative.   HENT: Negative.   Eyes: Negative.   Respiratory: Negative.   Cardiovascular: Negative.   Endocrine: Negative.   Genitourinary: Negative.   Musculoskeletal: Negative.   Skin: Negative.   Allergic/Immunologic: Negative.   Neurological: Negative.   Hematological: Negative.   Psychiatric/Behavioral: Negative.        Objective:   Physical Exam  Constitutional: She is oriented to person, place, and time. She appears well-developed and well-nourished.  HENT:  Head: Normocephalic and atraumatic.  Eyes: Conjunctivae and EOM are normal. Pupils are equal, round, and reactive to light.  Neck: Normal range of motion.  Musculoskeletal:       Right shoulder: She exhibits decreased range of motion, deformity and decreased strength. She exhibits no tenderness and no bony tenderness.  Positive subluxation right shoulder no tenderness over the acromioclavicular joint. Negative impingement sign Does have reduced external rotation and abduction    Neurological: She is alert and oriented to person, place, and time. Coordination and gait abnormal.  Grip strength is 4/5 on the right side 5/5 on the left side biceps is 4/5 on the right and 5/5 on the left triceps 4/5 and right 5/5 on the left deltoid is 3 minus on the right limited by range of motion and 5 on the left. Summary strength is 4/5 bilateral hip flexor knee extensor and Left ankle dorsiflexor. Does have 3 minus right ankle plantar flexor as well as dorsiflexor  Psychiatric: She has a normal mood and affect.  Nursing note and vitals reviewed.         Assessment & Plan:  1. History of bilateral strokes with primary deficits being on the right side. Her right shoulder weakness is compounded by adhesive capsulitis   Agree with  outpatient PTOT speech. This will be done in [facility] to avoid high transportation costs  2. Acromioclavicular arthritis right side improved after injection under ultrasound guidance. We'll repeat in 2 more months if pain recurs

## 2016-01-23 ENCOUNTER — Telehealth: Payer: Self-pay | Admitting: Pulmonary Disease

## 2016-01-23 DIAGNOSIS — R2689 Other abnormalities of gait and mobility: Secondary | ICD-10-CM | POA: Diagnosis not present

## 2016-01-23 DIAGNOSIS — Z7409 Other reduced mobility: Secondary | ICD-10-CM | POA: Diagnosis not present

## 2016-01-23 DIAGNOSIS — R531 Weakness: Secondary | ICD-10-CM | POA: Diagnosis not present

## 2016-01-23 DIAGNOSIS — R293 Abnormal posture: Secondary | ICD-10-CM | POA: Diagnosis not present

## 2016-01-23 DIAGNOSIS — I69354 Hemiplegia and hemiparesis following cerebral infarction affecting left non-dominant side: Secondary | ICD-10-CM | POA: Diagnosis not present

## 2016-01-23 MED ORDER — PREDNISONE 10 MG PO TABS: ORAL_TABLET | ORAL | Status: DC

## 2016-01-23 NOTE — Telephone Encounter (Signed)
Patient aware of Dr. Bari Mantis recommendations. Rx sent to pharmacy. Nothing further needed.

## 2016-01-23 NOTE — Telephone Encounter (Signed)
Prednisone 10 mg tabs  Take 2 tabs daily with food x 5ds, then 1 tab daily with food x 5ds then STOP Mucinex 600 bid or robitussin

## 2016-01-23 NOTE — Telephone Encounter (Signed)
Spoke with pt's husband and he states that pt has been coughing with clear mucus and wheeze. He denies pt has SOB/CP/tightness. Pt has been taking Dulera BID and albuterol HFA as needed with some improvement in symptoms. Husband reports that cough is worse at night. He is asking for prednisone taper as she is due to start rehab and wants to have pred taper before that.   RA - Please advise as to prednisone taper. Thanks!

## 2016-01-30 DIAGNOSIS — R531 Weakness: Secondary | ICD-10-CM | POA: Diagnosis not present

## 2016-01-30 DIAGNOSIS — I69354 Hemiplegia and hemiparesis following cerebral infarction affecting left non-dominant side: Secondary | ICD-10-CM | POA: Diagnosis not present

## 2016-01-30 DIAGNOSIS — R2689 Other abnormalities of gait and mobility: Secondary | ICD-10-CM | POA: Diagnosis not present

## 2016-01-30 DIAGNOSIS — R293 Abnormal posture: Secondary | ICD-10-CM | POA: Diagnosis not present

## 2016-01-30 DIAGNOSIS — Z7409 Other reduced mobility: Secondary | ICD-10-CM | POA: Diagnosis not present

## 2016-02-01 DIAGNOSIS — Z7409 Other reduced mobility: Secondary | ICD-10-CM | POA: Diagnosis not present

## 2016-02-01 DIAGNOSIS — I69354 Hemiplegia and hemiparesis following cerebral infarction affecting left non-dominant side: Secondary | ICD-10-CM | POA: Diagnosis not present

## 2016-02-01 DIAGNOSIS — R293 Abnormal posture: Secondary | ICD-10-CM | POA: Diagnosis not present

## 2016-02-01 DIAGNOSIS — R2689 Other abnormalities of gait and mobility: Secondary | ICD-10-CM | POA: Diagnosis not present

## 2016-02-01 DIAGNOSIS — R531 Weakness: Secondary | ICD-10-CM | POA: Diagnosis not present

## 2016-02-06 DIAGNOSIS — R293 Abnormal posture: Secondary | ICD-10-CM | POA: Diagnosis not present

## 2016-02-06 DIAGNOSIS — I69354 Hemiplegia and hemiparesis following cerebral infarction affecting left non-dominant side: Secondary | ICD-10-CM | POA: Diagnosis not present

## 2016-02-06 DIAGNOSIS — Z7409 Other reduced mobility: Secondary | ICD-10-CM | POA: Diagnosis not present

## 2016-02-06 DIAGNOSIS — R531 Weakness: Secondary | ICD-10-CM | POA: Diagnosis not present

## 2016-02-06 DIAGNOSIS — R2689 Other abnormalities of gait and mobility: Secondary | ICD-10-CM | POA: Diagnosis not present

## 2016-02-08 DIAGNOSIS — I69354 Hemiplegia and hemiparesis following cerebral infarction affecting left non-dominant side: Secondary | ICD-10-CM | POA: Diagnosis not present

## 2016-02-08 DIAGNOSIS — Z7409 Other reduced mobility: Secondary | ICD-10-CM | POA: Diagnosis not present

## 2016-02-08 DIAGNOSIS — R531 Weakness: Secondary | ICD-10-CM | POA: Diagnosis not present

## 2016-02-08 DIAGNOSIS — R2689 Other abnormalities of gait and mobility: Secondary | ICD-10-CM | POA: Diagnosis not present

## 2016-02-08 DIAGNOSIS — R293 Abnormal posture: Secondary | ICD-10-CM | POA: Diagnosis not present

## 2016-02-13 ENCOUNTER — Ambulatory Visit: Payer: Medicare Other | Admitting: Podiatry

## 2016-02-13 DIAGNOSIS — Z7409 Other reduced mobility: Secondary | ICD-10-CM | POA: Diagnosis not present

## 2016-02-13 DIAGNOSIS — R531 Weakness: Secondary | ICD-10-CM | POA: Diagnosis not present

## 2016-02-13 DIAGNOSIS — R293 Abnormal posture: Secondary | ICD-10-CM | POA: Diagnosis not present

## 2016-02-13 DIAGNOSIS — R2689 Other abnormalities of gait and mobility: Secondary | ICD-10-CM | POA: Diagnosis not present

## 2016-02-13 DIAGNOSIS — I69354 Hemiplegia and hemiparesis following cerebral infarction affecting left non-dominant side: Secondary | ICD-10-CM | POA: Diagnosis not present

## 2016-02-15 DIAGNOSIS — Z7409 Other reduced mobility: Secondary | ICD-10-CM | POA: Diagnosis not present

## 2016-02-15 DIAGNOSIS — R2689 Other abnormalities of gait and mobility: Secondary | ICD-10-CM | POA: Diagnosis not present

## 2016-02-15 DIAGNOSIS — R293 Abnormal posture: Secondary | ICD-10-CM | POA: Diagnosis not present

## 2016-02-15 DIAGNOSIS — I69354 Hemiplegia and hemiparesis following cerebral infarction affecting left non-dominant side: Secondary | ICD-10-CM | POA: Diagnosis not present

## 2016-02-15 DIAGNOSIS — R531 Weakness: Secondary | ICD-10-CM | POA: Diagnosis not present

## 2016-02-20 DIAGNOSIS — Z7409 Other reduced mobility: Secondary | ICD-10-CM | POA: Diagnosis not present

## 2016-02-20 DIAGNOSIS — I69354 Hemiplegia and hemiparesis following cerebral infarction affecting left non-dominant side: Secondary | ICD-10-CM | POA: Diagnosis not present

## 2016-02-20 DIAGNOSIS — R531 Weakness: Secondary | ICD-10-CM | POA: Diagnosis not present

## 2016-02-20 DIAGNOSIS — R293 Abnormal posture: Secondary | ICD-10-CM | POA: Diagnosis not present

## 2016-02-20 DIAGNOSIS — R2689 Other abnormalities of gait and mobility: Secondary | ICD-10-CM | POA: Diagnosis not present

## 2016-02-21 ENCOUNTER — Ambulatory Visit (INDEPENDENT_AMBULATORY_CARE_PROVIDER_SITE_OTHER): Payer: Medicare Other | Admitting: Neurology

## 2016-02-21 ENCOUNTER — Encounter: Payer: Self-pay | Admitting: Neurology

## 2016-02-21 VITALS — BP 107/64 | HR 62

## 2016-02-21 DIAGNOSIS — R413 Other amnesia: Secondary | ICD-10-CM

## 2016-02-21 DIAGNOSIS — I6310 Cerebral infarction due to embolism of unspecified precerebral artery: Secondary | ICD-10-CM

## 2016-02-21 DIAGNOSIS — R269 Unspecified abnormalities of gait and mobility: Secondary | ICD-10-CM | POA: Diagnosis not present

## 2016-02-21 DIAGNOSIS — I639 Cerebral infarction, unspecified: Secondary | ICD-10-CM

## 2016-02-21 DIAGNOSIS — I69398 Other sequelae of cerebral infarction: Secondary | ICD-10-CM | POA: Diagnosis not present

## 2016-02-21 MED ORDER — MEMANTINE HCL 10 MG PO TABS: 10.0000 mg | ORAL_TABLET | Freq: Two times a day (BID) | ORAL | 11 refills | Status: DC

## 2016-02-21 NOTE — Progress Notes (Signed)
Chief Complaint  Patient presents with  . Cerebrovascular Accident    She is here with her husband, Darlene Murray and son, Darlene Murray.  She is still in PT and OT three times weekly.  Feels her recovery has been slow.  . Memory Loss    MMSE 20/30 - 9 animals.  They feel her memory has declined.   Chief Complaint  Patient presents with  . Cerebrovascular Accident    She is here with her husband, Darlene Murray and son, Darlene Murray.  She is still in PT and OT three times weekly.  Feels her recovery has been slow.  . Memory Loss    MMSE 20/30 - 9 animals.  They feel her memory has declined.      PATIENT: Darlene Murray DOB: [birth year]  Chief Complaint  Patient presents with  . Cerebrovascular Accident    She is here with her husband, Darlene Murray and son, Darlene Murray.  She is still in PT and OT three times weekly.  Feels her recovery has been slow.  . Memory Loss    MMSE 20/30 - 9 animals.  They feel her memory has declined.     HISTORICAL  Darlene Murray is a 82 years old right-handed female, accompanied by her husband, and her son Darlene Murray at today's visit, following up her hospital admission for stroke in April 05, 2015  She had a past medical history of hypertension, hyperlipidemia, taking low-dose prednisone 5 mg daily for asthma, born blindness of right eye, left vision is good, she was highly functional prior to her most recent stroke, driving, cooking, ambulate without assistance   She was admitted to the hospital in April 04 2015 for acute onset of nausea or vomiting unsteady gait, I have personally reviewed MRI of the brain in April 06 2015, left hippocampus hemorrhagic stroke, also moderate nonhemorrhagic stroke involving posterior right upper and mid pons, adjacent right cerebellum, acute tiny nonhemorrhagic left thalamic infarction, the mechanism of her stroke was suspicious for embolic, she was put on aspirin 325 mg daily.  CT angiogram of head and neck showed no significant large vessel disease,  intracranial atherosclerosis. Echocardiogram was normal, estimated ejection fraction 60-65%, Laboratory showed LDL 104, A1c 6.0  With rehabilitation, she has regained marked recovery, she was able to ambulate without assistance, good range of motion of her right arm,  She was readmitted to the hospital again in October for UTI, and urosepsis, upon discharge in October 21st 2016, she was noted to have profound right arm weakness, increased gait difficulty, now she is wheelchair bound, need assistant to take a few steps using bathroom,  Over the past few weeks, she again making some recovery, able to dress herself, but still has profound gait difficulty, she denies bowel and bladder incontinence. She complains of mild to moderate right shoulder achy pain, limited range of motion of her right shoulder.  UPDATE Jun 13 2015: She fell twice since last visit, her right leg gave out, she still has significant right arm and leg weakness, gait difficulty,  We have personally reviewed MRI brain showed  1. There are new subacute infarcts in the left thalamus, left pons and left cerebellum that are new compared to MRI on 04/06/15.  2. Chronic infarcts noted the left antero-medial thalamus, right cerebellum, right pons and left hippocampus. These are stable from MRI on 04/06/15.  3. Mild scattered periventricular and subcortical chronic small vessel ischemic disease.   MRI cervical showed multi-level degenerative changes, with mild canal stenosis at C4-5, no cord signal  changes, muti-level foraminal stenosis.  She was on ASA 325mg  daily  UPDATE May 2nd 2017:  She suffered from another stroke since last visit in December 2016, I have reviewed and summarized the record,  In September 11 2015, she presented with sudden onset left-sided weakness, slurred speech, I personally reviewed MRI of the brain, acute nonhemorrhagic infarction at right thalamus, posterior limb of internal capsule, tiny acute medial left  occipital lobe infarction, remote bilateral cerebellar infarction, MRA of the brain and neck showed moderate to marked narrowing of the proximal right vertebral artery.  Echocardiogram with ejection fraction of [id number], grade 1 diastolic dysfunction. The patient did not receive tPA. She was discharged aspirin and Plavix went to inpatient rehabilitation, discharged home  She was still able to dress herself with minimum help, in October 19 2015, she had a sudden worsening gait difficulty, slurred speech, was taken to [facility], I reviewed CAT scan of the brain report, probable acute stroke involving right insular cortex, remote small vessel disease at bilateral cerebellum and thalamus, maxillary sinus level bilaterally, laboratory evaluation showed hemoglobin 12 point 7, creatinine of 0.93, normal liver functional test, she is now at rehabilitation place, husband planning on to bring her home soon. She is no longer ambulatory, had increased bilateral arm and leg weakness, she also complains of short-term memory trouble,right shoulder pain  UPDATE August 17th 2017: She has no recurrent strokelike event, taking Plavix, and Aggrenox, her husband and family take good care of her, she has good appetite, sleeps well, but has worsening memory loss, staying her wheelchair most of the time, need hospital lift to get up from seated position,   REVIEW OF SYSTEMS: Full 14 system review of systems performed and notable only for short-term memory loss, walking difficulty, joint pain,  ALLERGIES: No Known Allergies  HOME MEDICATIONS: Current Outpatient Prescriptions  Medication Sig Dispense Refill  . albuterol (PROVENTIL HFA;VENTOLIN HFA) 108 (90 Base) MCG/ACT inhaler Inhale 2 puffs into the lungs every 6 (six) hours as needed for wheezing or shortness of breath.     Marland Kitchen atorvastatin (LIPITOR) 10 MG tablet Take 10 mg by mouth daily.    Marland Kitchen b complex vitamins tablet Take 1 tablet by mouth daily.    .  Calcium Carb-Cholecalciferol (CALCIUM CARBONATE-VITAMIN D3 PO) Take 1 tablet by mouth 2 (two) times daily.    . clopidogrel (PLAVIX) 75 MG tablet Take 1 tablet (75 mg total) by mouth daily. 30 tablet 11  . Cyanocobalamin (VITAMIN B-12 PO) Take by mouth daily.    Marland Kitchen dipyridamole-aspirin (AGGRENOX) 200-25 MG 12hr capsule Take 1 capsule by mouth 2 (two) times daily. 180 capsule 3  . mometasone-formoterol (DULERA) 100-5 MCG/ACT AERO Inhale 2 puffs into the lungs 2 (two) times daily.    . Multiple Vitamins-Iron (MULTIVITAMIN/IRON PO) Take 1 tablet by mouth daily.    . predniSONE (DELTASONE) 5 MG tablet Take 1 tablet (5 mg total) by mouth daily with breakfast. 30 tablet 5  . pyridOXINE (B-6) 50 MG tablet Take 50 mg by mouth daily.     No current facility-administered medications for this visit.     PAST MEDICAL HISTORY: Past Medical History:  Diagnosis Date  . Acute left hemiparesis ([place])   . Allergic rhinitis   . Antibiotic-associated diarrhea   . Asthma    Moderate, persistent  . Asthmatic bronchitis   . Blindness    Legally blind, right eye  . Cough   . Fibrocystic breast disease   . HNP (herniated  nucleus pulposus), lumbar    L4-L5  . Hyperlipidemia   . Left ventricular hypertrophy 2008   mild  . Physical deconditioning   . Protein calorie malnutrition ([place])   . Right sided weakness   . Right-sided lacunar infarction ([place])   . Stroke (cerebrum) ([place])   . Venous insufficiency    lower extremities    PAST SURGICAL HISTORY: Past Surgical History:  Procedure Laterality Date  . CATARACT EXTRACTION    . COLONOSCOPY      FAMILY HISTORY: Family History  Problem Relation Age of Onset  . Heart attack Sister   . Alzheimer's disease Sister   . Asthma Son     SOCIAL HISTORY:  Social History   Social History  . Marital status: Married    Spouse name: N/A  . Number of children: 3  . Years of education: College   Occupational History  . Retired     Pharmacist, hospital   Social  History Main Topics  . Smoking status: Passive Smoke Exposure - Never Smoker  . Smokeless tobacco: Never Used     Comment: Prior exposure through husband.   . Alcohol use No  . Drug use: No  . Sexual activity: Yes    Partners: Female   Other Topics Concern  . Not on file   Social History Narrative   Lives at home with her husband.   Right-handed.   1 cup coffee per day.      [facility] [place] [facility]:   Originally from [facility] She has previously lived in [place]. Previously traveled to [place]. No pets currently. No prior bird exposure. Previously worked as a Education officer, museum. No mold or hot tub exposure.      PHYSICAL EXAM   Vitals:   02/21/16 1052  BP: 107/64  Pulse: 62    Not recorded      There is no height or weight on file to calculate BMI.  PHYSICAL EXAMNIATION:  Gen: NAD, conversant, well nourised, obese, well groomed                     Cardiovascular: Regular rate rhythm, no peripheral edema, warm, nontender. Eyes: Conjunctivae clear without exudates or hemorrhage Neck: Supple, no carotid bruise. Pulmonary: Clear to auscultation bilaterally   NEUROLOGICAL EXAM:  MENTAL STATUS: Speech:    Speech is normal; fluent and spontaneous with normal comprehension.  Cognition: Mini-Mental Status Examination is 20/30, animal naming 9     Orientation: She is not oriented to date, year month date, season, [place] and remote Memory: She missed 3 out of 3 recalls     Normal Attention span and concentration     Normal Language, naming, repeating,spontaneous speech, she has difficulty copy figure     Fund of knowledge   CRANIAL NERVES: CN II: Visual fields are full to confrontation. Bilateral pupil is round reactive to light CN III, IV, VI: She has a vertical gaze paralysis, vertical nystagmus,  CN V: Facial sensation is intact to pinprick in all 3 divisions bilaterally. Corneal responses are intact.  CN VII: Face is symmetric with normal eye closure and smile. CN  VIII: Hearing is normal to rubbing fingers CN IX, X: Palate elevates symmetrically. Phonation is normal. CN XI: Head turning and shoulder shrug are intact CN XII: Tongue is midline with normal movements and no atrophy.  MOTOR: She has mild spasticity of right upper extremity, complains of right shoulder pain, limited range of motion of right shoulder,  proximal and distal bilateral upper extremity motor strength is 4, she has mild bilateral hip flexion weakness,    REFLEXES: Reflexes are  hyperreflexia in both upper and lower extremity, right worse than left, plantar responses were extensor bilaterally   SENSORY: Intact to light touch, pinprick, position sense, and vibration sense are intact in fingers and toes.  COORDINATION: She has bilateral finger to nose dysmetria,  Gait: Deferred   DIAGNOSTIC DATA (LABS, IMAGING, TESTING) - I reviewed patient records, labs, notes, testing and imaging myself where available.   ASSESSMENT AND PLAN  Avira Burnard is a 82 y.o. female    Stroke   Multiple stroke involving cortex, and also posterior circulation  Most likely embolic stroke,  Keep Plavix 75 mg every day, change to Aggrenox 1 tablet twice a day  she never have TEE and cardiac monitoring complete, in-hospital telemetry showed no dysrhythmia      Mild dementia  Age-related process, central nervous system degenerative disorder, likely a vascular component.   Add on Namenda 10 mg twice a day  Marcial Pacas, M.D. Ph.D.  [facility] [address], [place], [place] [address] Ph: [phone] Fax: [phone]  CC: Hulan Fess, MD

## 2016-02-27 DIAGNOSIS — I69354 Hemiplegia and hemiparesis following cerebral infarction affecting left non-dominant side: Secondary | ICD-10-CM | POA: Diagnosis not present

## 2016-02-27 DIAGNOSIS — R269 Unspecified abnormalities of gait and mobility: Secondary | ICD-10-CM | POA: Diagnosis not present

## 2016-02-29 DIAGNOSIS — R269 Unspecified abnormalities of gait and mobility: Secondary | ICD-10-CM | POA: Diagnosis not present

## 2016-02-29 DIAGNOSIS — I69354 Hemiplegia and hemiparesis following cerebral infarction affecting left non-dominant side: Secondary | ICD-10-CM | POA: Diagnosis not present

## 2016-03-03 ENCOUNTER — Telehealth: Payer: Self-pay | Admitting: Neurology

## 2016-03-03 NOTE — Telephone Encounter (Signed)
Pt's husband called in stating [facility] is telling them there needs to be PA on memantine (NAMENDA) 10 MG tablet. Please call to give update to pt.

## 2016-03-05 ENCOUNTER — Ambulatory Visit (INDEPENDENT_AMBULATORY_CARE_PROVIDER_SITE_OTHER): Payer: Medicare Other | Admitting: Podiatry

## 2016-03-05 DIAGNOSIS — B351 Tinea unguium: Secondary | ICD-10-CM

## 2016-03-05 DIAGNOSIS — R269 Unspecified abnormalities of gait and mobility: Secondary | ICD-10-CM | POA: Diagnosis not present

## 2016-03-05 DIAGNOSIS — I69354 Hemiplegia and hemiparesis following cerebral infarction affecting left non-dominant side: Secondary | ICD-10-CM | POA: Diagnosis not present

## 2016-03-05 DIAGNOSIS — M79676 Pain in unspecified toe(s): Secondary | ICD-10-CM | POA: Diagnosis not present

## 2016-03-05 NOTE — Progress Notes (Signed)
Subjective: 82 y.o. returns the office today for painful, elongated, thickened toenails which she cannot trim herself. Denies any redness or drainage around the nails. Denies any acute since last appointment and no new complaints today. Denies any systemic complaints such as fevers, chills, nausea, vomiting. She has a history of 7 strokes and is in a wheelchair.   Objective: NAD DP/PT pulses palpable, CRT less than 3 seconds Nails hypertrophic, dystrophic, elongated, brittle, discolored 10. There is tenderness overlying the nails 1-5 bilaterally. There is no surrounding erythema or drainage along the nail sites. No open lesions or pre-ulcerative lesions are identified. No other areas of tenderness bilateral lower extremities. No overlying edema, erythema, increased warmth. No pain with calf compression, swelling, warmth, erythema.  Assessment: Patient presents with symptomatic onychomycosis  Plan: -Treatment options including alternatives, risks, complications were discussed -Nails sharply debrided 10 without complication/bleeding. -Discussed daily foot inspection. If there are any changes, to call the office immediately.  -Follow-up in 3 months or sooner if any problems are to arise. In the meantime, encouraged to call the office with any questions, concerns, changes symptoms.  Celesta Gentile, DPM

## 2016-03-05 NOTE — Telephone Encounter (Signed)
PA approved by [facility] [id number]) - valid through 03/06/2019 [id number].  Pt and pharmacy aware of approval.

## 2016-03-07 DIAGNOSIS — R269 Unspecified abnormalities of gait and mobility: Secondary | ICD-10-CM | POA: Diagnosis not present

## 2016-03-07 DIAGNOSIS — I69354 Hemiplegia and hemiparesis following cerebral infarction affecting left non-dominant side: Secondary | ICD-10-CM | POA: Diagnosis not present

## 2016-03-12 DIAGNOSIS — R269 Unspecified abnormalities of gait and mobility: Secondary | ICD-10-CM | POA: Diagnosis not present

## 2016-03-12 DIAGNOSIS — I69354 Hemiplegia and hemiparesis following cerebral infarction affecting left non-dominant side: Secondary | ICD-10-CM | POA: Diagnosis not present

## 2016-03-14 DIAGNOSIS — R399 Unspecified symptoms and signs involving the genitourinary system: Secondary | ICD-10-CM | POA: Diagnosis not present

## 2016-03-17 ENCOUNTER — Ambulatory Visit: Payer: Medicare Other | Admitting: Physical Medicine & Rehabilitation

## 2016-03-17 NOTE — Telephone Encounter (Signed)
Spoke to patient's husband, she is feeling better since stopping the medication.  He would still like to try the Namenda and would like to give her 5mg , BID for 1-2 weeks then attempt to increase to 10mg , BID again.  He will call back w/ any concerns.

## 2016-03-17 NOTE — Telephone Encounter (Signed)
Pt's husband called said 3-4 days after starting medication aththe begininning of the week (9/4) she became dizzy. He said since Thursday (03/13/16) or Friday (9/8/) she became confused and asked when she was going home, she woke up in the middle of the night and asked him why were they sleeping in car in the garage but they were in the bed. He d/c the medication He said he d/c the medication on Friday 9/8/. He said a UTI has been ruled out. There has been some improvement with the confusion. Please call

## 2016-03-19 DIAGNOSIS — R269 Unspecified abnormalities of gait and mobility: Secondary | ICD-10-CM | POA: Diagnosis not present

## 2016-03-19 DIAGNOSIS — I69354 Hemiplegia and hemiparesis following cerebral infarction affecting left non-dominant side: Secondary | ICD-10-CM | POA: Diagnosis not present

## 2016-03-21 DIAGNOSIS — I69354 Hemiplegia and hemiparesis following cerebral infarction affecting left non-dominant side: Secondary | ICD-10-CM | POA: Diagnosis not present

## 2016-03-21 DIAGNOSIS — R269 Unspecified abnormalities of gait and mobility: Secondary | ICD-10-CM | POA: Diagnosis not present

## 2016-03-26 DIAGNOSIS — R269 Unspecified abnormalities of gait and mobility: Secondary | ICD-10-CM | POA: Diagnosis not present

## 2016-03-26 DIAGNOSIS — I69354 Hemiplegia and hemiparesis following cerebral infarction affecting left non-dominant side: Secondary | ICD-10-CM | POA: Diagnosis not present

## 2016-03-27 ENCOUNTER — Encounter: Payer: Self-pay | Admitting: Pulmonary Disease

## 2016-03-27 ENCOUNTER — Ambulatory Visit (INDEPENDENT_AMBULATORY_CARE_PROVIDER_SITE_OTHER): Payer: Medicare Other | Admitting: Pulmonary Disease

## 2016-03-27 DIAGNOSIS — I639 Cerebral infarction, unspecified: Secondary | ICD-10-CM

## 2016-03-27 DIAGNOSIS — Z23 Encounter for immunization: Secondary | ICD-10-CM

## 2016-03-27 DIAGNOSIS — I872 Venous insufficiency (chronic) (peripheral): Secondary | ICD-10-CM | POA: Diagnosis not present

## 2016-03-27 DIAGNOSIS — J4550 Severe persistent asthma, uncomplicated: Secondary | ICD-10-CM

## 2016-03-27 MED ORDER — FUROSEMIDE 20 MG PO TABS: 20.0000 mg | ORAL_TABLET | Freq: Every day | ORAL | 0 refills | Status: DC

## 2016-03-27 MED ORDER — MOMETASONE FURO-FORMOTEROL FUM 100-5 MCG/ACT IN AERO: 2.0000 | INHALATION_SPRAY | Freq: Two times a day (BID) | RESPIRATORY_TRACT | 3 refills | Status: DC

## 2016-03-27 NOTE — Assessment & Plan Note (Signed)
Flu shot today Increase prednisone to 2 tablets for 5 days then back down to 1 tablet daily Stay on [facility]

## 2016-03-27 NOTE — Assessment & Plan Note (Signed)
Venous Doppler both lower extremities to rule out blood clot -she may not be a candidate for anticoagulation given history of hemorrhagic CVA Lasix 20 mg daily for one week

## 2016-03-27 NOTE — Patient Instructions (Signed)
Flu shot today Increase prednisone to 2 tablets for 5 days then back down to 1 tablet daily Venous Doppler both lower extremities to rule out blood clot Lasix 20 mg daily for one week

## 2016-03-27 NOTE — Progress Notes (Signed)
   Subjective:    Patient ID: Darlene Murray, female    DOB: 12/19/1921, 82 y.o.   MRN: [id number]  HPI  82 year old with severe persistent asthma, frequent exacerbations, steroid dependence.  Unfortunately she had several strokesand is now wheelchair bound needing assistance for all activities.  03/27/2016  Chief Complaint  Patient presents with  . Follow-up    coughing some during the night, some wheezing, swelling in ankles and feet.    She is accompanied by her husband who is her primary caregiver.   Husband reports mild increase in wheezing, with occasional nocturnal symptoms She is Maintained on 5 mg of prednisone She is compliant with Dulera through a spacer  She has no swallowing issues and no cough after eating. She does have some degree of hypersomnolence. Legs are chronically swollen but seems to have increased pedal edema   Review of Systems neg for any significant sore throat, dysphagia, itching, sneezing, nasal congestion or excess/ purulent secretions, fever, chills, sweats, unintended wt loss, pleuritic or exertional cp, hempoptysis, orthopnea pnd or change in chronic leg swelling. Also denies presyncope, palpitations, heartburn, abdominal pain, nausea, vomiting, diarrhea or change in bowel or urinary habits, dysuria,hematuria, rash, arthralgias, visual complaints, headache, numbness weakness or ataxia.     Objective:   Physical Exam  Gen. Pleasant, well-nourished,elderly, in no distress ENT - no lesions, no post nasal drip Neck: No JVD, no thyromegaly, no carotid bruits Lungs: no use of accessory muscles, no dullness to percussion, clear without rales or rhonchi  Cardiovascular: Rhythm regular, heart sounds  normal, no murmurs or gallops, 2+ peripheral edema Musculoskeletal: No deformities, no cyanosis or clubbing        Assessment & Plan:

## 2016-03-27 NOTE — Addendum Note (Signed)
Addended by: Mathis Dad on: 03/27/2016 05:38 PM   Modules accepted: Orders

## 2016-03-28 DIAGNOSIS — I69354 Hemiplegia and hemiparesis following cerebral infarction affecting left non-dominant side: Secondary | ICD-10-CM | POA: Diagnosis not present

## 2016-03-28 DIAGNOSIS — R269 Unspecified abnormalities of gait and mobility: Secondary | ICD-10-CM | POA: Diagnosis not present

## 2016-04-07 ENCOUNTER — Ambulatory Visit: Payer: Medicare Other

## 2016-04-07 ENCOUNTER — Ambulatory Visit: Payer: Medicare Other | Admitting: Physical Medicine & Rehabilitation

## 2016-04-15 ENCOUNTER — Encounter: Payer: Medicare Other | Attending: Physical Medicine & Rehabilitation

## 2016-04-15 ENCOUNTER — Ambulatory Visit (HOSPITAL_BASED_OUTPATIENT_CLINIC_OR_DEPARTMENT_OTHER): Payer: Medicare Other | Admitting: Physical Medicine & Rehabilitation

## 2016-04-15 ENCOUNTER — Encounter: Payer: Self-pay | Admitting: Physical Medicine & Rehabilitation

## 2016-04-15 VITALS — BP 102/61 | HR 79 | Resp 16

## 2016-04-15 DIAGNOSIS — R269 Unspecified abnormalities of gait and mobility: Secondary | ICD-10-CM | POA: Diagnosis not present

## 2016-04-15 DIAGNOSIS — R270 Ataxia, unspecified: Secondary | ICD-10-CM | POA: Insufficient documentation

## 2016-04-15 DIAGNOSIS — M4712 Other spondylosis with myelopathy, cervical region: Secondary | ICD-10-CM

## 2016-04-15 DIAGNOSIS — J45909 Unspecified asthma, uncomplicated: Secondary | ICD-10-CM | POA: Insufficient documentation

## 2016-04-15 DIAGNOSIS — I517 Cardiomegaly: Secondary | ICD-10-CM | POA: Diagnosis not present

## 2016-04-15 DIAGNOSIS — I872 Venous insufficiency (chronic) (peripheral): Secondary | ICD-10-CM | POA: Insufficient documentation

## 2016-04-15 DIAGNOSIS — I69319 Unspecified symptoms and signs involving cognitive functions following cerebral infarction: Secondary | ICD-10-CM | POA: Insufficient documentation

## 2016-04-15 DIAGNOSIS — I69398 Other sequelae of cerebral infarction: Secondary | ICD-10-CM

## 2016-04-15 DIAGNOSIS — I6930 Unspecified sequelae of cerebral infarction: Secondary | ICD-10-CM | POA: Diagnosis not present

## 2016-04-15 DIAGNOSIS — M5126 Other intervertebral disc displacement, lumbar region: Secondary | ICD-10-CM | POA: Insufficient documentation

## 2016-04-15 DIAGNOSIS — I639 Cerebral infarction, unspecified: Secondary | ICD-10-CM | POA: Diagnosis not present

## 2016-04-15 DIAGNOSIS — I69919 Unspecified symptoms and signs involving cognitive functions following unspecified cerebrovascular disease: Secondary | ICD-10-CM

## 2016-04-15 DIAGNOSIS — E785 Hyperlipidemia, unspecified: Secondary | ICD-10-CM | POA: Insufficient documentation

## 2016-04-15 NOTE — Progress Notes (Signed)
Subjective:    Patient ID: Darlene Murray, female    DOB: 08/02/1921, 81 y.o.   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 She underwent inpatient rehabilitation at [facility] She was rehospitalized in October for UTI. CT scan at that time demonstrated no new stroke. Has had additional imaging studies, 06/14/2015 cervical spine MRI demonstrating severe bilateral stenosis C4-5 and right C5-C6. HPI  Completed PT, OT , SLP  ~2wks ago.   Right shoulder injection June 2017 Right AC jt still effective Per husband, she is overall doing a little bit better but still requiring assistance for mobility, ADLs. Patient still has weakness on the right side, particularly in the shoulder area Pain Inventory Average Pain 0 Pain Right Now 0 My pain is No pain  In the last 24 hours, has pain interfered with the following? General activity 0 Relation with others 0 Enjoyment of life 0 What TIME of day is your pain at its worst? No pain Sleep (in general) Good  Pain is worse with: No pain Pain improves with: No pain Relief from Meds: No pain  Mobility ability to climb steps?  no do you drive?  no use a wheelchair needs help with transfers Do you have any goals in this area?  yes  Function retired I need assistance with the following:  dressing, bathing, toileting, meal prep, household duties and shopping  Neuro/Psych tremor trouble walking  Prior Studies Any changes since last visit?  no  Physicians involved in your care Any changes since last visit?  no   Family History  Problem Relation Age of Onset  . Heart attack Sister   . Alzheimer's disease Sister   . Asthma Son    Social History   Social History  . Marital status: Married    Spouse name: N/A  . Number of children: 3  . Years of education: College   Occupational History  . Retired     Pharmacist, hospital   Social History Main Topics  . Smoking status: Passive Smoke Exposure -  Never Smoker  . Smokeless tobacco: Never Used     Comment: Prior exposure through husband.   . Alcohol use No  . Drug use: No  . Sexual activity: Yes    Partners: Female   Other Topics Concern  . None   Social History Narrative   Lives at home with her husband.   Right-handed.   1 cup coffee per day.      [place] [facility]:   Originally from [facility]. She has previously lived in [place]. Previously traveled to [place]. No pets currently. No prior bird exposure. Previously worked as a Education officer, museum. No mold or hot tub exposure.    Past Surgical History:  Procedure Laterality Date  . CATARACT EXTRACTION    . COLONOSCOPY     Past Medical History:  Diagnosis Date  . Acute left hemiparesis ([place])   . Allergic rhinitis   . Antibiotic-associated diarrhea   . Asthma    Moderate, persistent  . Asthmatic bronchitis   . Blindness    Legally blind, right eye  . Cough   . Fibrocystic breast disease   . HNP (herniated nucleus pulposus), lumbar    L4-L5  . Hyperlipidemia   . Left ventricular hypertrophy 2008   mild  . Physical deconditioning   . Protein calorie malnutrition ([place])   . Right sided weakness   . Right-sided lacunar infarction ([place])   . Stroke (cerebrum) ([place])   .  Venous insufficiency    lower extremities   BP 102/61   Pulse 79   Resp 16   SpO2 96%   Opioid Risk Score:   Fall Risk Score:  `1  Depression screen PHQ 2/9  Depression screen [facility] 2/9 10/05/2015 05/17/2015  Decreased Interest 0 0  Down, Depressed, Hopeless 0 0  PHQ - 2 Score 0 0  Altered sleeping - 0  Tired, decreased energy - 0  Change in appetite - 0  Feeling bad or failure about yourself  - 0  Trouble concentrating - 0  Moving slowly or fidgety/restless - 0  Suicidal thoughts - 0  PHQ-9 Score - 0  Difficult doing work/chores - Not difficult at all     Review of Systems  Constitutional: Positive for unexpected weight change.  Respiratory: Positive for wheezing.   Musculoskeletal:  Positive for joint swelling.       Limb Swelling  All other systems reviewed and are negative.      Objective:   Physical Exam Patient with limited verbal output. She is able to answer simple questions and follow simple commands. Her motor strength is 4 minus at the right finger flexors, finger extensors, wrist flexors and extensors. 3 minus bicep and trace deltoid on the right side, 5/5 in the left deltoid, biceps, triceps, grip She has 4 minus/5 bilateral hip flexion, knee extension, ankle dorsi flexion, plantar flexion. 3 plus pitting edema bilateral pretibial area. She has no tenderness over the acromioclavicular joint. No pain with shoulder range of motion including adduction and abduction and external rotation, patient does have some limitations with abduction      Assessment & Plan:  1. Right shoulder pain, improved. She has a history of acromioclavicular joint arthropathy, but this has improved after injection. She has still some limitation of right shoulder range of motion, likely some residual adhesive capsulitis, but overall improved. 2. History of multiple CVAs with residual cognitive deficits. 3. Right upper extremity weakness. Overall improved but she likely has concomitant C5-C6 radiculopathy which is causing weakness of the right deltoid and biceps. Given age and multiple CVAs do not think she would be a good candidate for surgical referral

## 2016-04-24 ENCOUNTER — Other Ambulatory Visit: Payer: Self-pay | Admitting: *Deleted

## 2016-04-24 MED ORDER — MEMANTINE HCL 10 MG PO TABS: 10.0000 mg | ORAL_TABLET | Freq: Two times a day (BID) | ORAL | 3 refills | Status: DC

## 2016-05-05 ENCOUNTER — Other Ambulatory Visit (INDEPENDENT_AMBULATORY_CARE_PROVIDER_SITE_OTHER): Payer: Medicare Other

## 2016-05-05 ENCOUNTER — Ambulatory Visit (INDEPENDENT_AMBULATORY_CARE_PROVIDER_SITE_OTHER)
Admission: RE | Admit: 2016-05-05 | Discharge: 2016-05-05 | Disposition: A | Discharge status: Home / Self Care | Payer: Medicare Other | Source: Ambulatory Visit | Attending: Internal Medicine | Admitting: Internal Medicine

## 2016-05-05 ENCOUNTER — Encounter: Payer: Self-pay | Admitting: Internal Medicine

## 2016-05-05 ENCOUNTER — Ambulatory Visit (INDEPENDENT_AMBULATORY_CARE_PROVIDER_SITE_OTHER): Payer: Medicare Other | Admitting: Internal Medicine

## 2016-05-05 VITALS — BP 118/74 | HR 69

## 2016-05-05 DIAGNOSIS — R0600 Dyspnea, unspecified: Secondary | ICD-10-CM

## 2016-05-05 DIAGNOSIS — J4550 Severe persistent asthma, uncomplicated: Secondary | ICD-10-CM

## 2016-05-05 DIAGNOSIS — I639 Cerebral infarction, unspecified: Secondary | ICD-10-CM

## 2016-05-05 DIAGNOSIS — R05 Cough: Secondary | ICD-10-CM | POA: Diagnosis not present

## 2016-05-05 LAB — CBC WITH DIFFERENTIAL/PLATELET
Basophils Absolute: 0.1 10*3/uL (ref 0.0–0.1)
Basophils Relative: 0.6 % (ref 0.0–3.0)
EOS ABS: 0.2 10*3/uL (ref 0.0–0.7)
EOS PCT: 2.5 % (ref 0.0–5.0)
HCT: 41.5 % (ref 36.0–46.0)
HEMOGLOBIN: 13.8 g/dL (ref 12.0–15.0)
Lymphocytes Relative: 27.8 % (ref 12.0–46.0)
Lymphs Abs: 2.5 10*3/uL (ref 0.7–4.0)
MCHC: 33.3 g/dL (ref 30.0–36.0)
MCV: 91.4 fl (ref 78.0–100.0)
MONO ABS: 0.9 10*3/uL (ref 0.1–1.0)
Monocytes Relative: 9.6 % (ref 3.0–12.0)
Neutro Abs: 5.4 10*3/uL (ref 1.4–7.7)
Neutrophils Relative %: 59.5 % (ref 43.0–77.0)
Platelets: 268 10*3/uL (ref 150.0–400.0)
RBC: 4.55 Mil/uL (ref 3.87–5.11)
RDW: 15.5 % (ref 11.5–15.5)
WBC: 9.1 10*3/uL (ref 4.0–10.5)

## 2016-05-05 LAB — HEPATIC FUNCTION PANEL
ALK PHOS: 59 U/L (ref 39–117)
ALT: 27 U/L (ref 0–35)
AST: 21 U/L (ref 0–37)
Albumin: 3.7 g/dL (ref 3.5–5.2)
BILIRUBIN TOTAL: 0.4 mg/dL (ref 0.2–1.2)
Bilirubin, Direct: 0.1 mg/dL (ref 0.0–0.3)
Total Protein: 6.9 g/dL (ref 6.0–8.3)

## 2016-05-05 LAB — BASIC METABOLIC PANEL
BUN: 23 mg/dL (ref 6–23)
CALCIUM: 9.5 mg/dL (ref 8.4–10.5)
CO2: 31 meq/L (ref 19–32)
Chloride: 105 mEq/L (ref 96–112)
Creatinine, Ser: 1.2 mg/dL (ref 0.40–1.20)
GFR: 54.02 mL/min — AB (ref >60.00)
GLUCOSE: 106 mg/dL — AB (ref 70–99)
Potassium: 4.3 mEq/L (ref 3.5–5.1)
SODIUM: 144 meq/L (ref 135–145)

## 2016-05-05 LAB — TSH: TSH: 2.03 u[IU]/mL (ref 0.35–4.50)

## 2016-05-05 LAB — BRAIN NATRIURETIC PEPTIDE: PRO B NATRI PEPTIDE: 16 pg/mL (ref 0.0–100.0)

## 2016-05-05 NOTE — Progress Notes (Signed)
Subjective:    Patient ID: Darlene Murray, female    DOB: 1922/06/14,     MRN: [id number]    Brief patient profile:  82 year-old with severe persistent asthma, frequent exacerbations, steroid dependence.  Unfortunately she had several strokes and is now wheelchair bound needing assistance for all activities.    03/27/2016  Darlene Murray  Chief Complaint  Patient presents with  . Follow-up    coughing some during the night, some wheezing, swelling in ankles and feet.    She is accompanied by her husband who is her primary caregiver.   Husband reports mild increase in wheezing, with occasional nocturnal symptoms She is Maintained on 5 mg of prednisone She is compliant with Dulera through a spacer  She has no swallowing issues and no cough after eating. She does have some degree of hypersomnolence. Legs are chronically swollen but seems to have increased pedal edema rec  Flu shot today Increase prednisone to 2 tablets for 5 days then back down to 1 tablet daily Venous Doppler both lower extremities to rule out blood clot Lasix 20 mg daily for one week     05/05/2016 acute extended ov/Darlene Murray re: asthma /dulera 100/ pred dep and saba freq / pred floor of 5 mg daily  Chief Complaint  Patient presents with  . Acute Visit    Cough x 2 wks- prod with clear sputum. She has also been wheezing some. She increased pred from 5 to 10 2 wks ago, and this has not helped the cough.     cough is 24/7 but min clear sputum / seems better on the higher pred dose Using spacer now but did not bring it, not sure it's working right  No problem with swallowing  No obvious day to day or daytime variability or assoc   purulent sputum or mucus plugs or hemoptysis or cp or chest tightness,  or overt sinus or hb symptoms. No unusual exp hx or h/o childhood pna/ asthma or knowledge of premature birth.  Sleeping ok without nocturnal  or early am exacerbation  of respiratory  c/o's or need for noct saba. Also  denies any obvious fluctuation of symptoms with weather or environmental changes or other aggravating or alleviating factors except as outlined above   Current Medications, Allergies, Complete Past Medical History, Past Surgical History, Family History, and Social History were reviewed in [employer] record.  ROS  The following are not active complaints unless bolded sore throat, dysphagia, dental problems, itching, sneezing,  nasal congestion or excess/ purulent secretions, ear ache,   fever, chills, sweats, unintended wt loss, classically pleuritic or exertional cp,  orthopnea pnd or leg swelling, presyncope, palpitations, abdominal pain, anorexia, nausea, vomiting, diarrhea  or change in bowel or bladder habits, change in stools or urine, dysuria,hematuria,  rash, arthralgias, visual complaints, headache, numbness, weakness or ataxia or problems with walking or coordination,  change in mood/affect or memory.                    Objective:   Physical Exam  Gen. Chronically ill/ w/c bound elderly wf nad   Wt Readings from Last 3 Encounters:  11/08/15 168 lb 12.8 oz (76.6 kg)  10/23/15 162 lb (73.5 kg)  10/12/15 162 lb (73.5 kg)    Vital signs reviewed - Note on arrival 02 sats  98% on RA   ENT - no lesions, no post nasal drip Neck: No JVD, no thyromegaly, no carotid bruits Lungs:  no use of accessory muscles, min insp and exp rhonchi/  somewhat diminshed at L base  Cardiovascular: Rhythm regular, heart sounds  normal, no murmurs or gallops, 1-2+ peripheral pitting  edema Musculoskeletal: No deformities, no cyanosis or clubbing     CXR PA and Lateral:   05/05/2016 :    I personally reviewed images and agree with radiology impression as follows:    There is no evidence of pneumonia nor CHF. Gaseous distention of the stomach elevates the left hemidiaphragm which likely compromises the inflation of the left lung.  Labs ordered/ reviewed:     Chemistry        Component Value Date/Time   NA 144 05/05/2016 1521   NA 145 10/29/2015   K 4.3 05/05/2016 1521   CL 105 05/05/2016 1521   CO2 31 05/05/2016 1521   BUN 23 05/05/2016 1521   BUN 17 10/29/2015   CREATININE 1.20 05/05/2016 1521   GLU 79 10/29/2015      Component Value Date/Time   CALCIUM 9.5 05/05/2016 1521   ALKPHOS 59 05/05/2016 1521   AST 21 05/05/2016 1521   ALT 27 05/05/2016 1521   BILITOT 0.4 05/05/2016 1521        Lab Results  Component Value Date   WBC 9.1 05/05/2016   HGB 13.8 05/05/2016   HCT 41.5 05/05/2016   MCV 91.4 05/05/2016   PLT 268.0 05/05/2016         Lab Results  Component Value Date   TSH 2.03 05/05/2016     Lab Results  Component Value Date   PROBNP 16.0 05/05/2016               Assessment & Plan:

## 2016-05-05 NOTE — Patient Instructions (Addendum)
Please remember to go to the lab and x-ray department downstairs for your tests - we will call you with the results when they are available.  No change in recommendations for prednisone or dulera 100     Try prilosec otc 20mg   Take 30-60 min before first meal of the day and Pepcid ac (famotidine) 20 mg one @  bedtime until return     GERD (REFLUX)  is an extremely common cause of respiratory symptoms just like yours , many times with no obvious heartburn at all.    It can be treated with medication, but also with lifestyle changes including elevation of the head of your bed (ideally with 6 inch  bed blocks),  Smoking cessation, avoidance of late meals, excessive alcohol, and avoid fatty foods, chocolate, peppermint, colas, red wine, and acidic juices such as orange juice.  NO MINT OR MENTHOL PRODUCTS SO NO COUGH DROPS   USE SUGARLESS CANDY INSTEAD (Jolley ranchers or Stover's or Life Savers) or even ice chips will also do - the key is to swallow to prevent all throat clearing. NO OIL BASED VITAMINS - use powdered substitutes.    Please schedule a follow up office visit in 2  weeks, sooner if needed to see Tammy in the [facility] office but bring inhaler and spacer with you as well as all active medications

## 2016-05-06 ENCOUNTER — Ambulatory Visit: Payer: Medicare Other | Admitting: Podiatry

## 2016-05-06 LAB — RESPIRATORY ALLERGY PROFILE REGION II ~~LOC~~
Allergen, A. alternata, m6: <0.1 kU/L
Allergen, C. Herbarum, M2: <0.1 kU/L
Allergen, D pternoyssinus,d7: <0.1 kU/L
Allergen, Mulberry, t76: <0.1 kU/L
Allergen, Oak,t7: <0.1 kU/L
Bermuda Grass: <0.1 kU/L
Box Elder IgE: <0.1 kU/L
Cat Dander: <0.1 kU/L
Cockroach: <0.1 kU/L
D. farinae: <0.1 kU/L
Dog Dander: <0.1 kU/L
Elm IgE: <0.1 kU/L
IGE (IMMUNOGLOBULIN E), SERUM: 70 kU/L (ref <115)
Johnson Grass: <0.1 kU/L
Pecan/Hickory Tree IgE: <0.1 kU/L
Sheep Sorrel IgE: <0.1 kU/L
Timothy Grass: <0.1 kU/L

## 2016-05-06 NOTE — Progress Notes (Signed)
Spoke with pt and notified of results per Dr. Wert. Pt verbalized understanding and denied any questions. 

## 2016-05-07 NOTE — Progress Notes (Signed)
Spoke with pt and notified of results per Dr. Wert. Pt verbalized understanding and denied any questions. 

## 2016-05-09 NOTE — Assessment & Plan Note (Addendum)
Severe  persistent asthma, atopy, passive smoke exposure HFA technique only 30% up to 50% after retraining 5/12>>>only 50% with more training 12/23/10>>>75% 02/12/11 >>>20% 03/05/11 D/C HFAs 03/05/11, start BD Neb Meds d/t poor HFA technique PFTs 5/18: FeV1 94%  Fef 25 75 55%   >>>66% post BD Maintaining Prednisone 10mg /d for maintenance RX 06/02/11 2012: IgE 100, RAST neg - Allergy profile 05/05/16  >  Eos 0.2 /  IgE   70 neg RAST   DDX of  difficult airways management almost all start with A and  include Adherence, Ace Inhibitors, Acid Reflux, Active Sinus Disease, Alpha 1 Antitripsin deficiency, Anxiety masquerading as Airways dz,  ABPA,  Allergy(esp in young), Aspiration (esp in elderly), Adverse effects of meds,  Active smokers, A bunch of PE's (a small clot burden can't cause this syndrome unless there is already severe underlying pulm or vascular dz with poor reserve) plus two Bs  = Bronchiectasis and Beta blocker use..and one C= CHF  Adherence is always the initial "prime suspect" and is a multilayered concern that requires a "trust but verify" approach in every patient - starting with knowing how to use medications, especially inhalers, correctly, keeping up with refills and understanding the fundamental difference between maintenance and prns vs those medications only taken for a very short course and then stopped and not refilled.  - 05/05/2016  After extensive coaching HFA effectiveness =    25% s spacer but doesn't carry it with her so advised we need to prove she can use this effectively or consider laba/ics neb (avoid dpi since her main concern now is refractory cough)    ? Acid (or non-acid) GERD > always difficult to exclude as up to 75% of pts in some series report no assoc GI/ Heartburn symptoms> rec max (24h)  acid suppression and diet restrictions/ reviewed and instructions given in writing.   ? Allergy > double pred until better then back to baseline  ? chf > ruled out with bnp so  low in absence of known MS and given nl LA size 03/08/15   I had an extended discussion with the patient reviewing all relevant studies completed to date and  lasting 25 minutes of a 40  minute acute  visit  Re refractory symptoms in pt new to me   Each maintenance medication was reviewed in detail including most importantly the difference between maintenance and prns and under what circumstances the prns are to be triggered using an action plan format that is not reflected in the computer generated alphabetically organized AVS.    Please see instructions for details which were reviewed in writing and the patient given a copy highlighting the part that I personally wrote and discussed at today's ov.

## 2016-05-09 NOTE — Assessment & Plan Note (Signed)
No evidenc of anemia/ thyroid dz/ chf

## 2016-05-13 ENCOUNTER — Ambulatory Visit: Payer: Medicare Other | Admitting: Podiatry

## 2016-05-20 ENCOUNTER — Encounter: Payer: Self-pay | Admitting: Podiatry

## 2016-05-20 ENCOUNTER — Ambulatory Visit (INDEPENDENT_AMBULATORY_CARE_PROVIDER_SITE_OTHER): Payer: Medicare Other | Admitting: Podiatry

## 2016-05-20 DIAGNOSIS — M79676 Pain in unspecified toe(s): Secondary | ICD-10-CM | POA: Diagnosis not present

## 2016-05-20 DIAGNOSIS — B351 Tinea unguium: Secondary | ICD-10-CM | POA: Diagnosis not present

## 2016-05-20 NOTE — Progress Notes (Signed)
Subjective: 82 y.o. returns the office today for painful, elongated, thickened toenails which she cannot trim herself. Denies any redness or drainage around the nails. Denies any acute since last appointment and no new complaints today. Denies any systemic complaints such as fevers, chills, nausea, vomiting. She has a history of 7 strokes and is in a wheelchair.   Objective: NAD DP/PT pulses palpable, CRT less than 3 seconds Nails hypertrophic, dystrophic, elongated, brittle, discolored 10. There is tenderness overlying the nails 1-5 bilaterally. There is no surrounding erythema or drainage along the nail sites. No open lesions or pre-ulcerative lesions are identified. No other areas of tenderness bilateral lower extremities. No overlying edema, erythema, increased warmth. No pain with calf compression, swelling, warmth, erythema.  Assessment: Patient presents with symptomatic onychomycosis  Plan: -Treatment options including alternatives, risks, complications were discussed -Nails sharply debrided 10 without complication/bleeding. -Discussed daily foot inspection. If there are any changes, to call the office immediately.  -Follow-up in 3 months or sooner if any problems are to arise. In the meantime, encouraged to call the office with any questions, concerns, changes symptoms.  Celesta Gentile, DPM

## 2016-05-22 ENCOUNTER — Ambulatory Visit (INDEPENDENT_AMBULATORY_CARE_PROVIDER_SITE_OTHER): Payer: Medicare Other | Admitting: Adult Health

## 2016-05-22 ENCOUNTER — Ambulatory Visit: Payer: Medicare Other | Admitting: Adult Health

## 2016-05-22 ENCOUNTER — Encounter: Payer: Self-pay | Admitting: Adult Health

## 2016-05-22 DIAGNOSIS — I639 Cerebral infarction, unspecified: Secondary | ICD-10-CM | POA: Diagnosis not present

## 2016-05-22 DIAGNOSIS — I5189 Other ill-defined heart diseases: Secondary | ICD-10-CM

## 2016-05-22 DIAGNOSIS — J4550 Severe persistent asthma, uncomplicated: Secondary | ICD-10-CM | POA: Diagnosis not present

## 2016-05-22 DIAGNOSIS — I519 Heart disease, unspecified: Secondary | ICD-10-CM

## 2016-05-22 NOTE — Progress Notes (Signed)
Subjective:    Patient ID: Darlene Murray, female    DOB: 1921-07-13, 82 y.o.   MRN: [id number]  HPI 82 yo female never smoker with severe steroid dependent asthma   05/22/2016  Follow up : Asthma  Pt returns for 2 week follow up for Asthma. She is accompanied by her family , she has dementia. She has asthma flare with cough last ov . Felt GERD trigger. Recommended PPI /pepcid. CXR and labs reviewed with no acute finding . She is better with breathing back to baseline  And decreased cough  She remains on prednisone 5mg .daily . Steroid education  Would like sample of dulera due to high cost of inhalers.  Pt inhaler teaching with spacer done.   She has diastolic dysfunction and chronic peripheral edema on lasix .  No flare of edema .  Flu PVX and Prevnar utd.  .    Past Medical History:  Diagnosis Date  . Acute left hemiparesis (Darlene Murray)   . Allergic rhinitis   . Antibiotic-associated diarrhea   . Asthma    Moderate, persistent  . Asthmatic bronchitis   . Blindness    Legally blind, right eye  . Cough   . Fibrocystic breast disease   . HNP (herniated nucleus pulposus), lumbar    L4-L5  . Hyperlipidemia   . Left ventricular hypertrophy 2008   mild  . Physical deconditioning   . Protein calorie malnutrition (Darlene Murray)   . Right sided weakness   . Right-sided lacunar infarction (Darlene Murray)   . Stroke (cerebrum) (Darlene Murray)   . Venous insufficiency    lower extremities   Current Outpatient Prescriptions on File Prior to Visit  Medication Sig Dispense Refill  . albuterol (PROVENTIL HFA;VENTOLIN HFA) 108 (90 Base) MCG/ACT inhaler Inhale 2 puffs into the lungs every 6 (six) hours as needed for wheezing or shortness of breath.     Darlene Murray Kitchen atorvastatin (LIPITOR) 10 MG tablet Take 10 mg by mouth daily.    Darlene Murray Kitchen b complex vitamins tablet Take 1 tablet by mouth daily.    . Calcium Carb-Cholecalciferol (CALCIUM CARBONATE-VITAMIN D3 PO) Take 1 tablet by mouth 2 (two) times daily.    . clopidogrel (PLAVIX) 75 MG  tablet Take 1 tablet (75 mg total) by mouth daily. 30 tablet 11  . Cyanocobalamin (VITAMIN B-12 PO) Take by mouth daily.    Darlene Murray Kitchen dipyridamole-aspirin (AGGRENOX) 200-25 MG 12hr capsule Take 1 capsule by mouth 2 (two) times daily. 180 capsule 3  . DULERA 100-5 MCG/ACT AERO Inhale 2 puffs into the lungs 2 (two) times daily.    . furosemide (LASIX) 20 MG tablet Take 1 tablet (20 mg total) by mouth daily. 7 tablet 0  . memantine (NAMENDA) 10 MG tablet Take 1 tablet (10 mg total) by mouth 2 (two) times daily. 120 tablet 3  . Multiple Vitamins-Iron (MULTIVITAMIN/IRON PO) Take 1 tablet by mouth daily.    . predniSONE (DELTASONE) 5 MG tablet Take 1 tablet (5 mg total) by mouth daily with breakfast. 30 tablet 5  . pyridOXINE (B-6) 50 MG tablet Take 50 mg by mouth daily.     No current facility-administered medications on file prior to visit.     Review of Systems Constitutional:   No  weight loss, night sweats,  Fevers, chills,  +fatigue, or  lassitude.  HEENT:   No headaches,  Difficulty swallowing,  Tooth/dental problems, or  Sore throat,                No sneezing, itching,  ear ache, nasal congestion, post nasal drip,   CV:  No chest pain,  Orthopnea, PND,  , anasarca, dizziness, palpitations, syncope.   GI  No heartburn, indigestion, abdominal pain, nausea, vomiting, diarrhea, change in bowel habits, loss of appetite, bloody stools.   Resp:    No excess mucus, no productive cough,  No non-productive cough,  No coughing up of blood.  No change in color of mucus. + wheezing.  No chest wall deformity  Skin: no rash or lesions.  GU: no dysuria, change in color of urine, no urgency or frequency.  No flank pain, no hematuria   MS:  No joint pain or swelling.  No decreased range of motion.  No back pain.  Psych:  No change in mood or affect. No depression or anxiety.  No memory loss.         Objective:   Physical Exam   Vitals:   05/22/16 1218  BP: 126/66  Pulse: 68  SpO2: 96%  Weight:  176 lb (79.8 kg)    GEN: A/Ox3; pleasant , NAD, well nourished , appears younger than stated age.    HEENT:  [place]/AT,  EACs-clear, TMs-wnl, NOSE-clear, THROAT-clear, no lesions, no postnasal drip or exudate noted.   NECK:  Supple w/ fair ROM; no JVD; normal carotid impulses w/o bruits; no thyromegaly or nodules palpated; no lymphadenopathy.    RESP  Faint exp wheeze , no stridor ,. no accessory muscle use, no dullness to percussion  CARD:  RRR, no m/r/g  , 1- peripheral edema, pulses intact, no cyanosis or clubbing.  GI:   Soft & nt; nml bowel sounds; no organomegaly or masses detected.   Musco: Warm bil, no deformities or joint swelling noted.   Neuro: alert, no focal deficits noted.    Skin: Warm, no lesions or rashes   Marielena Harvell NP-C  [facility]  05/22/2016

## 2016-05-22 NOTE — Assessment & Plan Note (Signed)
Recent flare now resolving , trigger control with GERD rx  Sample of synmbicort to help with cost , to use in place of [place] ,until sample is done then go back on [place] . Inhaler teaching done .   Plan  Patient Instructions  Continue on current regimen  May use symbicort samples(to help with cost) or Dulera 2 puffs Twice daily  , rinse after use.  follow up Dr. Elsworth Soho  In 3 months and As needed

## 2016-05-22 NOTE — Assessment & Plan Note (Signed)
Controlled on rx   Plan  No changes  

## 2016-05-22 NOTE — Patient Instructions (Signed)
Continue on current regimen  May use symbicort samples(to help with cost) or Dulera 2 puffs Twice daily  , rinse after use.  follow up Dr. Elsworth Soho  In 3 months and As needed

## 2016-05-28 NOTE — Progress Notes (Signed)
Reviewed & agree with plan  

## 2016-06-03 ENCOUNTER — Other Ambulatory Visit: Payer: Self-pay | Admitting: Pulmonary Disease

## 2016-06-03 MED ORDER — ALBUTEROL SULFATE HFA 108 (90 BASE) MCG/ACT IN AERS: 2.0000 | INHALATION_SPRAY | Freq: Four times a day (QID) | RESPIRATORY_TRACT | 5 refills | Status: DC | PRN

## 2016-06-03 NOTE — Telephone Encounter (Signed)
Rx sent and changed Rx to RA instead of CY. Nothing more needed at this time.

## 2016-06-12 ENCOUNTER — Ambulatory Visit: Payer: Medicare Other | Admitting: Adult Health

## 2016-06-20 ENCOUNTER — Telehealth: Payer: Self-pay | Admitting: Pulmonary Disease

## 2016-06-20 MED ORDER — PREDNISONE 10 MG PO TABS: ORAL_TABLET | ORAL | 0 refills | Status: DC

## 2016-06-20 NOTE — Telephone Encounter (Signed)
Called and spoke to pt's husband. Informed him of the recs per MW. Pt's husband verbalized understanding and denied any further questions or concerns at this time.

## 2016-06-20 NOTE — Telephone Encounter (Signed)
Prednisone 10 mg take  4 each am x 2 days,   2 each am x 2 days,  1 each am x 2 days and stop   Ov early next week if not better, to ER over w/e if worse

## 2016-06-20 NOTE — Telephone Encounter (Signed)
Called and spoke to pt's husband. Pt c/o prod cough with clear mucus, wheezing, increase use of albuterol inhaler with little help x 1 week. Pt denies f/c/s and CP/tightness. Pt is requesting a pred taper. Will send to DOD in RA's absence.   Dr. Melvyn Novas please advise. Thanks.

## 2016-06-25 DIAGNOSIS — I10 Essential (primary) hypertension: Secondary | ICD-10-CM | POA: Diagnosis not present

## 2016-06-25 DIAGNOSIS — J4540 Moderate persistent asthma, uncomplicated: Secondary | ICD-10-CM | POA: Diagnosis not present

## 2016-06-25 DIAGNOSIS — E7800 Pure hypercholesterolemia, unspecified: Secondary | ICD-10-CM | POA: Diagnosis not present

## 2016-06-25 DIAGNOSIS — Z8673 Personal history of transient ischemic attack (TIA), and cerebral infarction without residual deficits: Secondary | ICD-10-CM | POA: Diagnosis not present

## 2016-06-25 DIAGNOSIS — N189 Chronic kidney disease, unspecified: Secondary | ICD-10-CM | POA: Diagnosis not present

## 2016-06-25 DIAGNOSIS — I69354 Hemiplegia and hemiparesis following cerebral infarction affecting left non-dominant side: Secondary | ICD-10-CM | POA: Diagnosis not present

## 2016-06-26 ENCOUNTER — Encounter: Payer: Self-pay | Admitting: Adult Health

## 2016-06-26 ENCOUNTER — Ambulatory Visit (INDEPENDENT_AMBULATORY_CARE_PROVIDER_SITE_OTHER): Payer: Medicare Other | Admitting: Adult Health

## 2016-06-26 DIAGNOSIS — J4550 Severe persistent asthma, uncomplicated: Secondary | ICD-10-CM | POA: Diagnosis not present

## 2016-06-26 DIAGNOSIS — I639 Cerebral infarction, unspecified: Secondary | ICD-10-CM | POA: Diagnosis not present

## 2016-06-26 DIAGNOSIS — I519 Heart disease, unspecified: Secondary | ICD-10-CM | POA: Diagnosis not present

## 2016-06-26 DIAGNOSIS — I5189 Other ill-defined heart diseases: Secondary | ICD-10-CM

## 2016-06-26 MED ORDER — PREDNISONE 10 MG PO TABS: ORAL_TABLET | ORAL | 0 refills | Status: DC

## 2016-06-26 MED ORDER — BUDESONIDE-FORMOTEROL FUMARATE 160-4.5 MCG/ACT IN AERO: 2.0000 | INHALATION_SPRAY | Freq: Two times a day (BID) | RESPIRATORY_TRACT | 0 refills | Status: DC

## 2016-06-26 NOTE — Assessment & Plan Note (Signed)
DD with chronic edema , stable without flare  Cont w/ current regimen  Low salt diet

## 2016-06-26 NOTE — Addendum Note (Signed)
Addended by: Len Blalock on: 06/26/2016 11:36 AM   Modules accepted: Orders

## 2016-06-26 NOTE — Progress Notes (Signed)
@Patient  ID: Darlene Murray, female    DOB: [birth year], 82 y.o.   MRN: [id number]  Chief Complaint  Patient presents with  . Follow-up    asthma     Referring provider: Hulan Fess, MD  HPI: 82 year old female never smoker with severe steroid dependent asthma, baseline prednisone 5 mg daily  06/26/2016 Follow up: Asthma Flare  Patient returns for a one-week follow-up. Patient called in with increased cough and wheezing over the last 1-2 weeks. She was called in a prednisone taper on December 15. She has 1 day left on this. She is on a baseline dose of 5 mg daily. Patient does have dementia. Husband is with her today and helps with her history. Patient said wheezing and cough are better. She had no fever. Appetite has been good. She denies any chest pain, orthopnea, PND, or hemoptysis. She does have diastolic dysfunction but chronic peripheral edema is at baseline on Lasix.  Insurance does not cover her inhalers well. Samples were given to help offset cost.    No Known Allergies  Immunization History  Administered Date(s) Administered  . Influenza Split 03/20/2011, 03/31/2012, 05/07/2013, 02/27/2014  . Influenza Whole 04/06/2010  . Influenza,inj,Quad PF,36+ Mos 03/27/2016  . Influenza-Unspecified 03/08/2015  . PPD Test 09/25/2015  . Pneumococcal Conjugate-13 12/08/2013  . Pneumococcal Polysaccharide-23 12/08/2009  . Tdap 12/08/2013    Past Medical History:  Diagnosis Date  . Acute left hemiparesis ([place])   . Allergic rhinitis   . Antibiotic-associated diarrhea   . Asthma    Moderate, persistent  . Asthmatic bronchitis   . Blindness    Legally blind, right eye  . Cough   . Fibrocystic breast disease   . HNP (herniated nucleus pulposus), lumbar    L4-L5  . Hyperlipidemia   . Left ventricular hypertrophy 2008   mild  . Physical deconditioning   . Protein calorie malnutrition ([place])   . Right sided weakness   . Right-sided lacunar infarction ([place])   . Stroke  (cerebrum) ([place])   . Venous insufficiency    lower extremities    Tobacco History: History  Smoking Status  . Passive Smoke Exposure - Never Smoker  Smokeless Tobacco  . Never Used    Comment: Prior exposure through husband.    Counseling given: Not Answered   Outpatient Encounter Prescriptions as of 06/26/2016  Medication Sig  . albuterol (PROVENTIL HFA;VENTOLIN HFA) 108 (90 Base) MCG/ACT inhaler Inhale 2 puffs into the lungs every 6 (six) hours as needed for wheezing or shortness of breath.  [place] Kitchen atorvastatin (LIPITOR) 10 MG tablet Take 10 mg by mouth daily.  Mar[place] Kitchen b complex vitamins tablet Take 1 tablet by mouth daily.  . Calcium Carb-Cholecalciferol (CALCIUM CARBONATE-VITAMIN D3 PO) Take 1 tablet by mouth 2 (two) times daily.  . clopidogrel (PLAVIX) 75 MG tablet Take 1 tablet (75 mg total) by mouth daily.  . Cyanocobalamin (VITAMIN B-12 PO) Take by mouth daily.  Marland Kitchen dipyridamole-aspirin (AGGRENOX) 200-25 MG 12hr capsule Take 1 capsule by mouth 2 (two) times daily.  . DULERA 100-5 MCG/ACT AERO Inhale 2 puffs into the lungs 2 (two) times daily.  . furosemide (LASIX) 20 MG tablet Take 1 tablet (20 mg total) by mouth daily.  . memantine (NAMENDA) 10 MG tablet Take 1 tablet (10 mg total) by mouth 2 (two) times daily.  . Multiple Vitamins-Iron (MULTIVITAMIN/IRON PO) Take 1 tablet by mouth daily.  . predniSONE (DELTASONE) 5 MG tablet Take 1 tablet (5 mg total) by mouth daily with breakfast.  .  pyridOXINE (B-6) 50 MG tablet Take 50 mg by mouth daily.  . [DISCONTINUED] predniSONE (DELTASONE) 10 MG tablet Take 4 tabs x 2 days, then 2 tabs x 2 days, then 1 tab x 2 days, then stop. (Patient not taking: Reported on 06/26/2016)   No facility-administered encounter medications on file as of 06/26/2016.      Review of Systems  Constitutional:   No  weight loss, night sweats,  Fevers, chills,  +fatigue, or  lassitude.  HEENT:   No headaches,  Difficulty swallowing,  Tooth/dental problems, or   Sore throat,                No sneezing, itching, ear ache, nasal congestion, post nasal drip,   CV:  No chest pain,  Orthopnea, PND, swelling in lower extremities, anasarca, dizziness, palpitations, syncope.   GI  No heartburn, indigestion, abdominal pain, nausea, vomiting, diarrhea, change in bowel habits, loss of appetite, bloody stools.   Resp:  .  No chest wall deformity  Skin: no rash or lesions.  GU: no dysuria, change in color of urine, no urgency or frequency.  No flank pain, no hematuria   MS:  No joint pain or swelling.  No decreased range of motion.  No back pain.    Physical Exam  BP 126/66 (BP Location: Left Arm, Cuff Size: Normal)   Pulse 63   SpO2 99%   GEN: A/Ox3; pleasant , NAD, elderly  In wc    HEENT:  [place]/AT,  EACs-clear, TMs-wnl, NOSE-clear, THROAT-clear, no lesions, no postnasal drip or exudate noted.   NECK:  Supple w/ fair ROM; no JVD; normal carotid impulses w/o bruits; no thyromegaly or nodules palpated; no lymphadenopathy.    RESP  Clear  P & A; w/o, wheezes/ rales/ or rhonchi. no accessory muscle use, no dullness to percussion  CARD:  RRR, no m/r/g, 1+ peripheral edema, pulses intact, no cyanosis or clubbing.  GI:   Soft & nt; nml bowel sounds; no organomegaly or masses detected.   Musco: Warm bil, no deformities or joint swelling noted.   Neuro: alert, no focal deficits noted.    Skin: Warm, no lesions or rashes  Psych:  No change in mood or affect. No depression or anxiety.  No memory loss.  Lab Results:  CBC    Component Value Date/Time   WBC 9.1 05/05/2016 1521   RBC 4.55 05/05/2016 1521   HGB 13.8 05/05/2016 1521   HCT 41.5 05/05/2016 1521   PLT 268.0 05/05/2016 1521   MCV 91.4 05/05/2016 1521   MCH 27.7 09/21/2015 0537   MCHC 33.3 05/05/2016 1521   RDW 15.5 05/05/2016 1521   LYMPHSABS 2.5 05/05/2016 1521   MONOABS 0.9 05/05/2016 1521   EOSABS 0.2 05/05/2016 1521   BASOSABS 0.1 05/05/2016 1521    BMET    Component Value  Date/Time   NA 144 05/05/2016 1521   NA 145 10/29/2015   K 4.3 05/05/2016 1521   CL 105 05/05/2016 1521   CO2 31 05/05/2016 1521   GLUCOSE 106 (H) 05/05/2016 1521   BUN 23 05/05/2016 1521   BUN 17 10/29/2015   CREATININE 1.20 05/05/2016 1521   CALCIUM 9.5 05/05/2016 1521   GFRNONAA 48 (L) 09/21/2015 0537   GFRAA 55 (L) 09/21/2015 0537    BNP    Component Value Date/Time   BNP 14.1 12/16/2014 1025    ProBNP    Component Value Date/Time   PROBNP 16.0 05/05/2016 1521    Imaging: No  results found.   Assessment & Plan:   No problem-specific Assessment & Plan notes found for this encounter.     Rexene Edison, NP 06/26/2016

## 2016-06-26 NOTE — Assessment & Plan Note (Signed)
Recent flare now resolving  Keep at pred 10mg  daily for 2 weeks then go 5mg  daily   Plan  Patient Instructions  Taper prednisone 10mg  daily for 2 weeks then 5mg  daily and hold at this dose.  May use symbicort samples(to help with cost) or Dulera 2 puffs Twice daily  , rinse after use.  Follow up Dr. Elsworth Soho  In 3 months and As needed

## 2016-06-26 NOTE — Patient Instructions (Signed)
Taper prednisone 10mg  daily for 2 weeks then 5mg  daily and hold at this dose.  May use symbicort samples(to help with cost) or Dulera 2 puffs Twice daily  , rinse after use.  Follow up Dr. Elsworth Soho  In 3 months and As needed

## 2016-06-27 ENCOUNTER — Other Ambulatory Visit: Payer: Self-pay

## 2016-06-27 ENCOUNTER — Other Ambulatory Visit: Payer: Self-pay | Admitting: Pulmonary Disease

## 2016-06-27 MED ORDER — PREDNISONE 10 MG PO TABS: ORAL_TABLET | ORAL | 0 refills | Status: DC

## 2016-06-27 NOTE — Telephone Encounter (Signed)
[facility] called and stated the patient was not found in their system. They had the wrong last name for the patient. Will attempt to send in a new prescription since they have changed her name in the system.

## 2016-07-01 NOTE — Progress Notes (Signed)
Reviewed & agree with plan  

## 2016-07-04 ENCOUNTER — Encounter (HOSPITAL_COMMUNITY): Payer: Self-pay

## 2016-07-04 ENCOUNTER — Inpatient Hospital Stay (HOSPITAL_COMMUNITY)
Admission: EM | Admit: 2016-07-04 | Discharge: 2016-07-08 | DRG: 871 | Disposition: A | Discharge status: Home / Self Care | Payer: Medicare Other | Attending: Internal Medicine | Admitting: Internal Medicine

## 2016-07-04 ENCOUNTER — Emergency Department (HOSPITAL_COMMUNITY): Payer: Medicare Other

## 2016-07-04 DIAGNOSIS — J441 Chronic obstructive pulmonary disease with (acute) exacerbation: Secondary | ICD-10-CM | POA: Diagnosis present

## 2016-07-04 DIAGNOSIS — J9601 Acute respiratory failure with hypoxia: Secondary | ICD-10-CM | POA: Diagnosis present

## 2016-07-04 DIAGNOSIS — R0602 Shortness of breath: Secondary | ICD-10-CM | POA: Diagnosis not present

## 2016-07-04 DIAGNOSIS — F03B0 Unspecified dementia, moderate, without behavioral disturbance, psychotic disturbance, mood disturbance, and anxiety: Secondary | ICD-10-CM | POA: Diagnosis present

## 2016-07-04 DIAGNOSIS — J4551 Severe persistent asthma with (acute) exacerbation: Secondary | ICD-10-CM

## 2016-07-04 DIAGNOSIS — J09X2 Influenza due to identified novel influenza A virus with other respiratory manifestations: Secondary | ICD-10-CM | POA: Diagnosis not present

## 2016-07-04 DIAGNOSIS — I69319 Unspecified symptoms and signs involving cognitive functions following cerebral infarction: Secondary | ICD-10-CM

## 2016-07-04 DIAGNOSIS — Z79899 Other long term (current) drug therapy: Secondary | ICD-10-CM

## 2016-07-04 DIAGNOSIS — R069 Unspecified abnormalities of breathing: Secondary | ICD-10-CM | POA: Diagnosis not present

## 2016-07-04 DIAGNOSIS — J4540 Moderate persistent asthma, uncomplicated: Secondary | ICD-10-CM | POA: Diagnosis present

## 2016-07-04 DIAGNOSIS — Z9849 Cataract extraction status, unspecified eye: Secondary | ICD-10-CM

## 2016-07-04 DIAGNOSIS — Z820 Family history of epilepsy and other diseases of the nervous system: Secondary | ICD-10-CM

## 2016-07-04 DIAGNOSIS — I69993 Ataxia following unspecified cerebrovascular disease: Secondary | ICD-10-CM

## 2016-07-04 DIAGNOSIS — E872 Acidosis, unspecified: Secondary | ICD-10-CM | POA: Insufficient documentation

## 2016-07-04 DIAGNOSIS — E861 Hypovolemia: Secondary | ICD-10-CM | POA: Diagnosis present

## 2016-07-04 DIAGNOSIS — N183 Chronic kidney disease, stage 3 unspecified: Secondary | ICD-10-CM | POA: Insufficient documentation

## 2016-07-04 DIAGNOSIS — N179 Acute kidney failure, unspecified: Secondary | ICD-10-CM | POA: Diagnosis not present

## 2016-07-04 DIAGNOSIS — A419 Sepsis, unspecified organism: Principal | ICD-10-CM | POA: Diagnosis present

## 2016-07-04 DIAGNOSIS — Z825 Family history of asthma and other chronic lower respiratory diseases: Secondary | ICD-10-CM

## 2016-07-04 DIAGNOSIS — F0390 Unspecified dementia without behavioral disturbance: Secondary | ICD-10-CM | POA: Diagnosis present

## 2016-07-04 DIAGNOSIS — Z8249 Family history of ischemic heart disease and other diseases of the circulatory system: Secondary | ICD-10-CM

## 2016-07-04 DIAGNOSIS — I69393 Ataxia following cerebral infarction: Secondary | ICD-10-CM

## 2016-07-04 DIAGNOSIS — I878 Other specified disorders of veins: Secondary | ICD-10-CM | POA: Diagnosis present

## 2016-07-04 DIAGNOSIS — E46 Unspecified protein-calorie malnutrition: Secondary | ICD-10-CM | POA: Diagnosis not present

## 2016-07-04 DIAGNOSIS — H5461 Unqualified visual loss, right eye, normal vision left eye: Secondary | ICD-10-CM | POA: Diagnosis present

## 2016-07-04 DIAGNOSIS — I509 Heart failure, unspecified: Secondary | ICD-10-CM | POA: Diagnosis present

## 2016-07-04 DIAGNOSIS — J111 Influenza due to unidentified influenza virus with other respiratory manifestations: Secondary | ICD-10-CM | POA: Diagnosis present

## 2016-07-04 DIAGNOSIS — Z8673 Personal history of transient ischemic attack (TIA), and cerebral infarction without residual deficits: Secondary | ICD-10-CM

## 2016-07-04 DIAGNOSIS — Z7722 Contact with and (suspected) exposure to environmental tobacco smoke (acute) (chronic): Secondary | ICD-10-CM | POA: Diagnosis not present

## 2016-07-04 DIAGNOSIS — I872 Venous insufficiency (chronic) (peripheral): Secondary | ICD-10-CM | POA: Diagnosis present

## 2016-07-04 DIAGNOSIS — J4541 Moderate persistent asthma with (acute) exacerbation: Secondary | ICD-10-CM | POA: Diagnosis present

## 2016-07-04 DIAGNOSIS — H9190 Unspecified hearing loss, unspecified ear: Secondary | ICD-10-CM | POA: Diagnosis present

## 2016-07-04 LAB — COMPREHENSIVE METABOLIC PANEL
ALBUMIN: 3.3 g/dL — AB (ref 3.5–5.0)
ALT: 32 U/L (ref 14–54)
ANION GAP: 13 (ref 5–15)
AST: 31 U/L (ref 15–41)
Alkaline Phosphatase: 57 U/L (ref 38–126)
BILIRUBIN TOTAL: 0.9 mg/dL (ref 0.3–1.2)
BUN: 17 mg/dL (ref 6–20)
CHLORIDE: 106 mmol/L (ref 101–111)
CO2: 23 mmol/L (ref 22–32)
Calcium: 8.9 mg/dL (ref 8.9–10.3)
Creatinine, Ser: 1.16 mg/dL — ABNORMAL HIGH (ref 0.44–1.00)
GFR calc Af Amer: 46 mL/min — ABNORMAL LOW (ref >60)
GFR, EST NON AFRICAN AMERICAN: 40 mL/min — AB (ref >60)
Glucose, Bld: 97 mg/dL (ref 65–99)
POTASSIUM: 3.4 mmol/L — AB (ref 3.5–5.1)
Sodium: 142 mmol/L (ref 135–145)
TOTAL PROTEIN: 7.2 g/dL (ref 6.5–8.1)

## 2016-07-04 LAB — I-STAT CG4 LACTIC ACID, ED
LACTIC ACID, VENOUS: 2.53 mmol/L — AB (ref 0.5–1.9)
LACTIC ACID, VENOUS: 4.02 mmol/L — AB (ref 0.5–1.9)

## 2016-07-04 LAB — URINALYSIS, ROUTINE W REFLEX MICROSCOPIC
Bilirubin Urine: NEGATIVE
GLUCOSE, UA: NEGATIVE mg/dL
Hgb urine dipstick: NEGATIVE
Ketones, ur: NEGATIVE mg/dL
LEUKOCYTES UA: NEGATIVE
Nitrite: NEGATIVE
PH: 6 (ref 5.0–8.0)
PROTEIN: NEGATIVE mg/dL
SPECIFIC GRAVITY, URINE: 1.017 (ref 1.005–1.030)

## 2016-07-04 LAB — TROPONIN I

## 2016-07-04 LAB — CBC WITH DIFFERENTIAL/PLATELET
BASOS ABS: 0 10*3/uL (ref 0.0–0.1)
Basophils Relative: 0 %
Eosinophils Absolute: 0.2 10*3/uL (ref 0.0–0.7)
Eosinophils Relative: 2 %
HCT: 44 % (ref 36.0–46.0)
HEMOGLOBIN: 14.5 g/dL (ref 12.0–15.0)
LYMPHS ABS: 1.5 10*3/uL (ref 0.7–4.0)
LYMPHS PCT: 17 %
MCH: 30.4 pg (ref 26.0–34.0)
MCHC: 33 g/dL (ref 30.0–36.0)
MCV: 92.2 fL (ref 78.0–100.0)
Monocytes Absolute: 1.4 10*3/uL — ABNORMAL HIGH (ref 0.1–1.0)
Monocytes Relative: 15 %
NEUTROS ABS: 5.8 10*3/uL (ref 1.7–7.7)
Neutrophils Relative %: 66 %
PLATELETS: 214 10*3/uL (ref 150–400)
RBC: 4.77 MIL/uL (ref 3.87–5.11)
RDW: 15.3 % (ref 11.5–15.5)
WBC: 8.8 10*3/uL (ref 4.0–10.5)

## 2016-07-04 LAB — INFLUENZA PANEL BY PCR (TYPE A & B)
Influenza A By PCR: POSITIVE — AB
Influenza B By PCR: NEGATIVE

## 2016-07-04 MED ORDER — SODIUM CHLORIDE 0.9% FLUSH
3.0000 mL | Freq: Two times a day (BID) | INTRAVENOUS | Status: DC
  Administered 2016-07-04 – 2016-07-08 (×8): 3 mL via INTRAVENOUS

## 2016-07-04 MED ORDER — ACETAMINOPHEN 325 MG PO TABS: 650.0000 mg | ORAL_TABLET | Freq: Four times a day (QID) | ORAL | Status: DC | PRN

## 2016-07-04 MED ORDER — FAMOTIDINE 10 MG PO TABS
10.0000 mg | ORAL_TABLET | Freq: Every day | ORAL | Status: DC | PRN
  Filled 2016-07-04: qty 1

## 2016-07-04 MED ORDER — PANTOPRAZOLE SODIUM 40 MG PO TBEC
40.0000 mg | DELAYED_RELEASE_TABLET | Freq: Every morning | ORAL | Status: DC
  Administered 2016-07-05 – 2016-07-08 (×4): 40 mg via ORAL
  Filled 2016-07-04 (×4): qty 1

## 2016-07-04 MED ORDER — OSELTAMIVIR PHOSPHATE 75 MG PO CAPS
75.0000 mg | ORAL_CAPSULE | Freq: Once | ORAL | Status: AC
  Administered 2016-07-04: 75 mg via ORAL
  Filled 2016-07-04: qty 1

## 2016-07-04 MED ORDER — METHYLPREDNISOLONE SODIUM SUCC 125 MG IJ SOLR
60.0000 mg | Freq: Two times a day (BID) | INTRAMUSCULAR | Status: DC
  Filled 2016-07-04: qty 2

## 2016-07-04 MED ORDER — DEXTROSE 5 % IV SOLN
500.0000 mg | Freq: Once | INTRAVENOUS | Status: AC
  Administered 2016-07-04: 500 mg via INTRAVENOUS
  Filled 2016-07-04: qty 500

## 2016-07-04 MED ORDER — CLOPIDOGREL BISULFATE 75 MG PO TABS
75.0000 mg | ORAL_TABLET | Freq: Every day | ORAL | Status: DC
  Administered 2016-07-05 – 2016-07-08 (×4): 75 mg via ORAL
  Filled 2016-07-04 (×4): qty 1

## 2016-07-04 MED ORDER — SODIUM CHLORIDE 0.9 % IV BOLUS (SEPSIS)
1000.0000 mL | Freq: Once | INTRAVENOUS | Status: AC
  Administered 2016-07-04: 1000 mL via INTRAVENOUS

## 2016-07-04 MED ORDER — ONDANSETRON HCL 4 MG PO TABS: 4.0000 mg | ORAL_TABLET | Freq: Four times a day (QID) | ORAL | Status: DC | PRN

## 2016-07-04 MED ORDER — OSELTAMIVIR PHOSPHATE 6 MG/ML PO SUSR: 75.0000 mg | Freq: Two times a day (BID) | ORAL | Status: DC

## 2016-07-04 MED ORDER — ONDANSETRON HCL 4 MG/2ML IJ SOLN: 4.0000 mg | Freq: Four times a day (QID) | INTRAMUSCULAR | Status: DC | PRN

## 2016-07-04 MED ORDER — MEMANTINE HCL 10 MG PO TABS
10.0000 mg | ORAL_TABLET | Freq: Two times a day (BID) | ORAL | Status: DC
  Administered 2016-07-04 – 2016-07-08 (×8): 10 mg via ORAL
  Filled 2016-07-04 (×8): qty 1

## 2016-07-04 MED ORDER — METHYLPREDNISOLONE SODIUM SUCC 125 MG IJ SOLR
60.0000 mg | Freq: Two times a day (BID) | INTRAMUSCULAR | Status: DC
  Administered 2016-07-04 – 2016-07-07 (×6): 60 mg via INTRAVENOUS
  Filled 2016-07-04 (×5): qty 2

## 2016-07-04 MED ORDER — IPRATROPIUM-ALBUTEROL 0.5-2.5 (3) MG/3ML IN SOLN
3.0000 mL | RESPIRATORY_TRACT | Status: DC
  Administered 2016-07-04 – 2016-07-05 (×7): 3 mL via RESPIRATORY_TRACT
  Filled 2016-07-04 (×7): qty 3

## 2016-07-04 MED ORDER — FUROSEMIDE 20 MG PO TABS
20.0000 mg | ORAL_TABLET | Freq: Every day | ORAL | Status: DC
  Administered 2016-07-05 – 2016-07-08 (×4): 20 mg via ORAL
  Filled 2016-07-04 (×4): qty 1

## 2016-07-04 MED ORDER — SODIUM CHLORIDE 0.9 % IV SOLN: INTRAVENOUS | Status: DC

## 2016-07-04 MED ORDER — GUAIFENESIN ER 600 MG PO TB12
1200.0000 mg | ORAL_TABLET | Freq: Two times a day (BID) | ORAL | Status: DC
  Filled 2016-07-04: qty 2

## 2016-07-04 MED ORDER — SODIUM CHLORIDE 0.9 % IV SOLN
INTRAVENOUS | Status: AC
  Administered 2016-07-04: 20:00:00 via INTRAVENOUS

## 2016-07-04 MED ORDER — GUAIFENESIN 100 MG/5ML PO SOLN
20.0000 mL | Freq: Four times a day (QID) | ORAL | Status: DC
  Administered 2016-07-05 – 2016-07-08 (×14): 400 mg via ORAL
  Filled 2016-07-04: qty 20
  Filled 2016-07-04: qty 5
  Filled 2016-07-04 (×5): qty 20
  Filled 2016-07-04: qty 5
  Filled 2016-07-04 (×4): qty 20
  Filled 2016-07-04: qty 10
  Filled 2016-07-04 (×4): qty 20

## 2016-07-04 MED ORDER — DOXYCYCLINE HYCLATE 100 MG IV SOLR
100.0000 mg | Freq: Two times a day (BID) | INTRAVENOUS | Status: DC
  Administered 2016-07-04 – 2016-07-06 (×4): 100 mg via INTRAVENOUS
  Filled 2016-07-04 (×4): qty 100

## 2016-07-04 MED ORDER — DEXTROSE 5 % IV SOLN
1.0000 g | Freq: Once | INTRAVENOUS | Status: AC
  Administered 2016-07-04: 1 g via INTRAVENOUS
  Filled 2016-07-04: qty 10

## 2016-07-04 MED ORDER — ACETAMINOPHEN 325 MG PO TABS
650.0000 mg | ORAL_TABLET | Freq: Once | ORAL | Status: AC
  Administered 2016-07-04: 650 mg via ORAL
  Filled 2016-07-04: qty 2

## 2016-07-04 MED ORDER — MAGNESIUM SULFATE 2 GM/50ML IV SOLN
2.0000 g | Freq: Once | INTRAVENOUS | Status: AC
  Administered 2016-07-04: 2 g via INTRAVENOUS
  Filled 2016-07-04: qty 50

## 2016-07-04 MED ORDER — VITAMIN B-6 50 MG PO TABS
50.0000 mg | ORAL_TABLET | Freq: Every morning | ORAL | Status: DC
  Administered 2016-07-05 – 2016-07-08 (×4): 50 mg via ORAL
  Filled 2016-07-04 (×4): qty 1

## 2016-07-04 MED ORDER — ACETAMINOPHEN 650 MG RE SUPP: 650.0000 mg | Freq: Four times a day (QID) | RECTAL | Status: DC | PRN

## 2016-07-04 MED ORDER — SODIUM CHLORIDE 0.9 % IV BOLUS (SEPSIS)
1500.0000 mL | Freq: Once | INTRAVENOUS | Status: AC
  Administered 2016-07-04: 1500 mL via INTRAVENOUS

## 2016-07-04 MED ORDER — MOMETASONE FURO-FORMOTEROL FUM 200-5 MCG/ACT IN AERO
2.0000 | INHALATION_SPRAY | Freq: Two times a day (BID) | RESPIRATORY_TRACT | Status: DC
  Administered 2016-07-04 – 2016-07-08 (×7): 2 via RESPIRATORY_TRACT
  Filled 2016-07-04: qty 8.8

## 2016-07-04 MED ORDER — ASPIRIN-DIPYRIDAMOLE ER 25-200 MG PO CP12
1.0000 | ORAL_CAPSULE | Freq: Two times a day (BID) | ORAL | Status: DC
  Administered 2016-07-04 – 2016-07-08 (×8): 1 via ORAL
  Filled 2016-07-04 (×9): qty 1

## 2016-07-04 MED ORDER — ATORVASTATIN CALCIUM 10 MG PO TABS
10.0000 mg | ORAL_TABLET | Freq: Every day | ORAL | Status: DC
  Administered 2016-07-05 – 2016-07-08 (×4): 10 mg via ORAL
  Filled 2016-07-04 (×4): qty 1

## 2016-07-04 MED ORDER — OSELTAMIVIR PHOSPHATE 6 MG/ML PO SUSR
30.0000 mg | Freq: Two times a day (BID) | ORAL | Status: DC
  Administered 2016-07-05 – 2016-07-08 (×7): 30 mg via ORAL
  Filled 2016-07-04 (×10): qty 5

## 2016-07-04 NOTE — ED Notes (Signed)
Face to face contact with Dr. Marily Memos at floor request . He states he feels telemetry is still appropriate for condition.

## 2016-07-04 NOTE — H&P (Addendum)
History and Physical    Darlene Murray [id number] DOB: 27-Nov-1921 DOA: 07/04/2016  PCP: Gennette Pac, MD Patient coming from: Home  Chief Complaint: Cough and weakness  HPI: Darlene Murray is a 82 y.o. female with medical history significant of multiple strokes, hyperlipidemia, protein calorie malnutrition, residual weakness from stroke, venous stasis insufficiency resulting in dependent edema, and COPD/asthma. Patient followed by pulmonologist in the outpatient setting. History provided per family by patient's husband as she has short-term memory loss from strokes and is unable to provide reliable history. Patient developed cough, wheezing and shortness of breath approximately 2 weeks ago. Gradual worsening until seen by her pulmonologist on 06/26/2016 and started on a steroid taper starting at 40 mg daily. Reported improvement in overall symptoms for approximately 2-3 days until he began worsening again despite increased steroid dose. Home breathing treatments with some improvement. Patient has maintained a strong appetite during this time but does report worsening generalized fatigue with body aches nice chest pain, palpitations, abdominal pain, dysuria, frequency, back pain, nausea, vomiting..    ED Course: Dictated findings outlined below. IV fluids and breathing treatment with some improvement.   Review of Systems: As per HPI otherwise 10 point review of systems negative.   Ambulatory Status:requires assistance due to previous strokes  Past Medical History:  Diagnosis Date  . Acute left hemiparesis ([place])   . Allergic rhinitis   . Antibiotic-associated diarrhea   . Asthma    Moderate, persistent  . Asthmatic bronchitis   . Blindness    Legally blind, right eye  . Cough   . Fibrocystic breast disease   . HNP (herniated nucleus pulposus), lumbar    L4-L5  . Hyperlipidemia   . Left ventricular hypertrophy 2008   mild  . Physical deconditioning   . Protein calorie  malnutrition ([place])   . Right sided weakness   . Right-sided lacunar infarction ([place])   . Stroke (cerebrum) ([place])   . Venous insufficiency    lower extremities    Past Surgical History:  Procedure Laterality Date  . CATARACT EXTRACTION    . COLONOSCOPY      Social History   Social History  . Marital status: Married    Spouse name: N/A  . Number of children: 3  . Years of education: College   Occupational History  . Retired     Pharmacist, hospital   Social History Main Topics  . Smoking status: Passive Smoke Exposure - Never Smoker  . Smokeless tobacco: Never Used     Comment: Prior exposure through husband.   . Alcohol use No  . Drug use: No  . Sexual activity: Yes    Partners: Female   Other Topics Concern  . Not on file   Social History Narrative   Lives at home with her husband.   Right-handed.   1 cup coffee per day.      [facility]:   Originally from [facility]. She has previously lived in [place]. Previously traveled to [place]. No pets currently. No prior bird exposure. Previously worked as a Education officer, museum. No mold or hot tub exposure.     No Known Allergies  Family History  Problem Relation Age of Onset  . Heart attack Sister   . Alzheimer's disease Sister   . Asthma Son     Prior to Admission medications   Medication Sig Start Date End Date Taking? Authorizing Provider  albuterol (PROVENTIL HFA;VENTOLIN HFA) 108 (90 Base) MCG/ACT inhaler Inhale 2 puffs into the lungs every  6 (six) hours as needed for wheezing or shortness of breath. 06/03/16  Yes Deneise Lever, MD  atorvastatin (LIPITOR) 10 MG tablet Take 10 mg by mouth daily at 6 PM.    Yes Historical Provider, MD  b complex vitamins tablet Take 1 tablet by mouth daily.   Yes Historical Provider, MD  budesonide-formoterol (SYMBICORT) 160-4.5 MCG/ACT inhaler Inhale 2 puffs into the lungs 2 (two) times daily. 06/26/16  Yes Tammy S Parrett, NP  Calcium Carb-Cholecalciferol (CALCIUM CARBONATE-VITAMIN D3 PO)  Take 1 tablet by mouth 2 (two) times daily.   Yes Historical Provider, MD  clopidogrel (PLAVIX) 75 MG tablet Take 1 tablet (75 mg total) by mouth daily. 06/13/15  Yes Marcial Pacas, MD  Cyanocobalamin (VITAMIN B-12 PO) Take 1 tablet by mouth daily.    Yes Historical Provider, MD  dipyridamole-aspirin (AGGRENOX) 200-25 MG 12hr capsule Take 1 capsule by mouth 2 (two) times daily. 12/27/15  Yes Marcial Pacas, MD  Docusate Sodium (PHILLIPS STOOL SOFTENER PO) Take 2 capsules by mouth every evening.   Yes Historical Provider, MD  DULERA 100-5 MCG/ACT AERO Inhale 2 puffs into the lungs 2 (two) times daily. 02/05/16  Yes Historical Provider, MD  Famotidine-Ca Carb-Mag Hydrox (PEPCID COMPLETE PO) Take 1 tablet by mouth at bedtime.   Yes Historical Provider, MD  folic acid (FOLVITE) [id number] MCG tablet Take 400 mcg by mouth daily.   Yes Historical Provider, MD  memantine (NAMENDA) 10 MG tablet Take 1 tablet (10 mg total) by mouth 2 (two) times daily. 04/24/16  Yes Marcial Pacas, MD  Multiple Vitamins-Iron (MULTIVITAMIN/IRON PO) Take 1 tablet by mouth daily.   Yes Historical Provider, MD  omeprazole (PRILOSEC OTC) 20 MG tablet Take 20 mg by mouth every morning.   Yes Historical Provider, MD  polyethylene glycol powder (GLYCOLAX/MIRALAX) powder Take 17 g by mouth every other day.   Yes Historical Provider, MD  predniSONE (DELTASONE) 10 MG tablet Take 1 tab daily as directed Patient taking differently: Take 10 mg by mouth daily. Through 07/15/16 06/27/16  Yes Tammy S Parrett, NP  predniSONE (DELTASONE) 5 MG tablet Take 1 tablet (5 mg total) by mouth daily with breakfast. Patient taking differently: Take 10 mg by mouth daily with breakfast.  12/17/15  Yes Rigoberto Noel, MD  pyridOXINE (B-6) 50 MG tablet Take 50 mg by mouth every morning.    Yes Historical Provider, MD  furosemide (LASIX) 20 MG tablet Take 1 tablet (20 mg total) by mouth daily. 03/27/16 03/27/17  Rigoberto Noel, MD    Physical Exam: Vitals:   07/04/16 1500 07/04/16  1514 07/04/16 1515 07/04/16 1545  BP: 137/75  119/100 121/75  Pulse: 114  114 112  Resp: 26  (!) 30 26  Temp:  102.8 F (39.3 C)    TempSrc:  Rectal    SpO2: 96%  97% 96%  Weight:      Height:         General: Ill-appearing, resting in bed  Eyes:  PERRL, EOMI, normal lids, iris ENT:  grossly normal hearing, lips & tongue, mmm Neck:  no LAD, masses or thyromegaly Cardiovascular:  RRR, III/VI systolic 123456 Bilat pitting edema.  Respiratory: Coarse breath sounds throughout, increased effort  Abdominal:  Soft, nontender, normoactive bowel sounds  Skin:  no rash or induration seen on limited exam Musculoskeletal: Left-sided weakness. no bony abnormality Psychiatric: Follow basic commands, pleasant, flattened affect  Neurologic:   mild left-sided facial droop, left sided weakness, sensation intact.  Labs on  Admission: I have personally reviewed following labs and imaging studies  CBC:  Recent Labs Lab 07/04/16 1420  WBC 8.8  NEUTROABS 5.8  HGB 14.5  HCT 44.0  MCV 92.2  PLT [id number]   Basic Metabolic Panel:  Recent Labs Lab 07/04/16 1420  NA 142  K 3.4*  CL 106  CO2 23  GLUCOSE 97  BUN 17  CREATININE 1.16*  CALCIUM 8.9   GFR: Estimated Creatinine Clearance: 32.2 mL/min (by C-G formula based on SCr of 1.16 mg/dL (H)). Liver Function Tests:  Recent Labs Lab 07/04/16 1420  AST 31  ALT 32  ALKPHOS 57  BILITOT 0.9  PROT 7.2  ALBUMIN 3.3*   No results for input(s): LIPASE, AMYLASE in the last 168 hours. No results for input(s): AMMONIA in the last 168 hours. Coagulation Profile: No results for input(s): INR, PROTIME in the last 168 hours. Cardiac Enzymes: No results for input(s): CKTOTAL, CKMB, CKMBINDEX, TROPONINI in the last 168 hours. BNP (last 3 results)  Recent Labs  05/05/16 1521  PROBNP 16.0   HbA1C: No results for input(s): HGBA1C in the last 72 hours. CBG: No results for input(s): GLUCAP in the last 168 hours. Lipid Profile: No results  for input(s): CHOL, HDL, LDLCALC, TRIG, CHOLHDL, LDLDIRECT in the last 72 hours. Thyroid Function Tests: No results for input(s): TSH, T4TOTAL, FREET4, T3FREE, THYROIDAB in the last 72 hours. Anemia Panel: No results for input(s): VITAMINB12, FOLATE, FERRITIN, TIBC, IRON, RETICCTPCT in the last 72 hours. Urine analysis:    Component Value Date/Time   COLORURINE YELLOW 07/04/2016 1535   APPEARANCEUR HAZY (A) 07/04/2016 1535   LABSPEC 1.017 07/04/2016 1535   PHURINE 6.0 07/04/2016 1535   GLUCOSEU NEGATIVE 07/04/2016 1535   HGBUR NEGATIVE 07/04/2016 1535   BILIRUBINUR NEGATIVE 07/04/2016 1535   KETONESUR NEGATIVE 07/04/2016 1535   PROTEINUR NEGATIVE 07/04/2016 1535   UROBILINOGEN 1.0 04/23/2015 1106   NITRITE NEGATIVE 07/04/2016 1535   LEUKOCYTESUR NEGATIVE 07/04/2016 1535    Creatinine Clearance: Estimated Creatinine Clearance: 32.2 mL/min (by C-G formula based on SCr of 1.16 mg/dL (H)).  Sepsis Labs: @LABRCNTIP (procalcitonin:4,lacticidven:4) )No results found for this or any previous visit (from the past 240 hour(s)).   Radiological Exams on Admission: Dg Chest Port 1 View  Result Date: 07/04/2016 CLINICAL DATA:  Shortness of breath. EXAM: PORTABLE CHEST 1 VIEW COMPARISON:  Radiographs of May 05, 2016. FINDINGS: Stable cardiomediastinal silhouette. No pneumothorax or pleural effusion is noted. Stable elevation of left hemidiaphragm is noted. Right lung is clear. Minimal left basilar subsegmental atelectasis is noted. Bony thorax is unremarkable. IMPRESSION: Stable elevation left hemidiaphragm with minimal left basilar subsegmental atelectasis. No other abnormality seen in the chest. Electronically Signed   By: Marijo Conception, M.D.   On: 07/04/2016 14:02    EKG: Independently reviewed. RBBB, unchanged from previous. sinus  Assessment/Plan Active Problems:   Influenza   Asthma/COPD exacerbation: followed by pulmonlogy outpt. Steroid dose increased to 40 mg a some  improvement initially but now getting worse again. Suspect secondary to viral infection. Family confirmed that she was recently exposed to multiple family members with influenza. Ceftriaxone, azithromycin and Tamiflu given in the ED. Improved with breathing treatments. Worse breath sounds throughout. On 5mg  prednisone daily at baseline - Observation  - Solu-Medrol 60 mg every 12  - DuoNeb every 4 - Mucinex - Continue Tamiflu unless influenza negative - Respiratory viral panel - DC ceftriaxone and azithromycin. Start doxycycline - Continue dulera - Mag  Tachycardia: likely from #1. EKG  w/o signs of overt ACS - EKG - gentle IVF  Lactic acidemia: likely from poor fluid intake and infection - IVF  CHF: no exacerbation - continue lasix   CKD: Cr 1.14. At basleine - BMET in am  Multiple strokes: - Continue Aggrenox, plavix (pharmacy to confirm), Lipitor  Confusion, short-term memory loss: At baseline - Continue Namenda  DVT prophylaxis:  SCD  Code Status: full  Family Communication: husband  Disposition Plan: pending imrpovement  Consults called: none  Admission status: obs    MERRELL, DAVID J MD [facility]  If 7PM-7AM, please contact night-coverage [email or web address] Password [facility]  07/04/2016, 5:12 PM

## 2016-07-04 NOTE — ED Notes (Signed)
Pt temp. Rectally 102.8 Nurse was notified.

## 2016-07-04 NOTE — ED Provider Notes (Signed)
[facility] DEPT Provider Note   CSN: [id number] Arrival date & time: 07/04/16  1311     History   Chief Complaint Chief Complaint  Patient presents with  . Shortness of Breath    HPI Darlene Murray is a 82 y.o. female.  She was sent here by EMS, after a family member felt she was confused. You might be having a stroke. Patient complains of cough and congestion with sore throat and vomiting. She appears to be poor historian. She is unable to give additional history.  During EMS transport, she was treated with a nebulizer 2 for "wheezing", which improved after the treatment.  Level V caveat- confusion  HPI  Past Medical History:  Diagnosis Date  . Acute left hemiparesis ([place])   . Allergic rhinitis   . Antibiotic-associated diarrhea   . Asthma    Moderate, persistent  . Asthmatic bronchitis   . Blindness    Legally blind, right eye  . Cough   . Fibrocystic breast disease   . HNP (herniated nucleus pulposus), lumbar    L4-L5  . Hyperlipidemia   . Left ventricular hypertrophy 2008   mild  . Physical deconditioning   . Protein calorie malnutrition ([place])   . Right sided weakness   . Right-sided lacunar infarction ([place])   . Stroke (cerebrum) ([place])   . Venous insufficiency    lower extremities    Patient Active Problem List   Diagnosis Date Noted  . Influenza 07/04/2016  . Dyspnea, unspecified 05/05/2016  . Residual cognitive deficit as late effect of stroke 04/15/2016  . Neuropathic pain of shoulder 11/29/2015  . Decreased GFR   . Asthma, severe persistent   . Thalamic infarct, acute ([place]) 09/13/2015  . Right-sided lacunar infarction ([place])   . History of CVA with residual deficit   . Asthma, moderate persistent   . Left-sided weakness 09/11/2015  . Cerebral infarction due to unspecified mechanism   . Acromioclavicular joint arthritis 08/20/2015  . Acute prerenal azotemia 08/02/2015  . CKD (chronic kidney disease), stage III 08/02/2015  . Memory loss  06/15/2015  . Spondylosis, cervical, with myelopathy 06/13/2015  . Ataxia S/P CVA 05/17/2015  . Gait disturbance, post-stroke 05/17/2015  . Sepsis ([place]) 04/23/2015  . Urinary tract infection 04/23/2015  . Acute hemorrhagic infarction of brain ([place]) 04/10/2015  . Ataxia   . History of mixed hemorraghic embolic stroke [id number]  . Weakness 04/06/2015  . Generalized weakness 04/06/2015  . Nausea with vomiting 04/06/2015  . Left ventricular diastolic dysfunction with preserved systolic function [id number]  . Severe persistent asthma in adult steroid dependent   . Allergic rhinitis   . Hyperlipidemia   . Venous insufficiency   . HNP (herniated nucleus pulposus), lumbar   . Left ventricular hypertrophy     Past Surgical History:  Procedure Laterality Date  . CATARACT EXTRACTION    . COLONOSCOPY      OB History    No data available       Home Medications    Prior to Admission medications   Medication Sig Start Date End Date Taking? Authorizing Provider  albuterol (PROVENTIL HFA;VENTOLIN HFA) 108 (90 Base) MCG/ACT inhaler Inhale 2 puffs into the lungs every 6 (six) hours as needed for wheezing or shortness of breath. 06/03/16   Deneise Lever, MD  atorvastatin (LIPITOR) 10 MG tablet Take 10 mg by mouth daily.    Historical Provider, MD  b complex vitamins tablet Take 1 tablet by mouth daily.    Historical Provider,  MD  budesonide-formoterol (SYMBICORT) 160-4.5 MCG/ACT inhaler Inhale 2 puffs into the lungs 2 (two) times daily. 06/26/16   Tammy S Parrett, NP  Calcium Carb-Cholecalciferol (CALCIUM CARBONATE-VITAMIN D3 PO) Take 1 tablet by mouth 2 (two) times daily.    Historical Provider, MD  clopidogrel (PLAVIX) 75 MG tablet Take 1 tablet (75 mg total) by mouth daily. 06/13/15   Marcial Pacas, MD  Cyanocobalamin (VITAMIN B-12 PO) Take by mouth daily.    Historical Provider, MD  dipyridamole-aspirin (AGGRENOX) 200-25 MG 12hr capsule Take 1 capsule by mouth 2 (two) times daily. 12/27/15    Marcial Pacas, MD  DULERA 100-5 MCG/ACT AERO Inhale 2 puffs into the lungs 2 (two) times daily. 02/05/16   Historical Provider, MD  furosemide (LASIX) 20 MG tablet Take 1 tablet (20 mg total) by mouth daily. 03/27/16 03/27/17  Rigoberto Noel, MD  memantine (NAMENDA) 10 MG tablet Take 1 tablet (10 mg total) by mouth 2 (two) times daily. 04/24/16   Marcial Pacas, MD  Multiple Vitamins-Iron (MULTIVITAMIN/IRON PO) Take 1 tablet by mouth daily.    Historical Provider, MD  predniSONE (DELTASONE) 10 MG tablet Take 1 tab daily as directed 06/27/16   Tammy S Parrett, NP  predniSONE (DELTASONE) 5 MG tablet Take 1 tablet (5 mg total) by mouth daily with breakfast. 12/17/15   Rigoberto Noel, MD  pyridOXINE (B-6) 50 MG tablet Take 50 mg by mouth daily.    Historical Provider, MD    Family History Family History  Problem Relation Age of Onset  . Heart attack Sister   . Alzheimer's disease Sister   . Asthma Son     Social History Social History  Substance Use Topics  . Smoking status: Passive Smoke Exposure - Never Smoker  . Smokeless tobacco: Never Used     Comment: Prior exposure through husband.   . Alcohol use No     Allergies   Patient has no known allergies.   Review of Systems Review of Systems  Unable to perform ROS: Mental status change     Physical Exam Updated Vital Signs BP 121/75   Pulse 112   Temp 102.8 F (39.3 C) (Rectal)   Resp 26   Ht 5\' 5"  (1.651 m)   Wt 175 lb (79.4 kg)   SpO2 96%   BMI 29.12 kg/m   Physical Exam  Constitutional: She appears well-developed. She appears distressed (Globally weak, unable to sit on own.).  Elderly, frail  HENT:  Head: Normocephalic and atraumatic.  Eyes: Conjunctivae and EOM are normal. Pupils are equal, round, and reactive to light.  Neck: Normal range of motion and phonation normal. Neck supple.  Cardiovascular: Regular rhythm.   Tachycardic  Pulmonary/Chest: Effort normal and breath sounds normal. No respiratory distress. She  exhibits no tenderness.  Tachypneic  Abdominal: Soft. She exhibits no distension. There is no tenderness. There is no guarding.  Musculoskeletal: Normal range of motion.  Normal handgrip strength bilaterally. She is able to mobilize both lower legs, by lifting them just an inch or 2 off the stretcher.  Neurological: She is alert. She exhibits normal muscle tone.  Skin: Skin is warm and dry.  Psychiatric: She has a normal mood and affect. Her behavior is normal.  Nursing note and vitals reviewed.    ED Treatments / Results  Labs (all labs ordered are listed, but only abnormal results are displayed) Labs Reviewed  COMPREHENSIVE METABOLIC PANEL - Abnormal; Notable for the following:       Result  Value   Potassium 3.4 (*)    Creatinine, Ser 1.16 (*)    Albumin 3.3 (*)    GFR calc non Af Amer 40 (*)    GFR calc Af Amer 46 (*)    All other components within normal limits  CBC WITH DIFFERENTIAL/PLATELET - Abnormal; Notable for the following:    Monocytes Absolute 1.4 (*)    All other components within normal limits  URINALYSIS, ROUTINE W REFLEX MICROSCOPIC - Abnormal; Notable for the following:    APPearance HAZY (*)    All other components within normal limits  I-STAT CG4 LACTIC ACID, ED - Abnormal; Notable for the following:    Lactic Acid, Venous 2.53 (*)    All other components within normal limits  CULTURE, BLOOD (ROUTINE X 2)  CULTURE, BLOOD (ROUTINE X 2)  URINE CULTURE  INFLUENZA PANEL BY PCR (TYPE A & B, H1N1)  I-STAT CG4 LACTIC ACID, ED    EKG  EKG Interpretation  Date/Time:  Friday July 04 2016 13:11:40 EST Ventricular Rate:  122 PR Interval:    QRS Duration: 133 QT Interval:  320 QTC Calculation: 456 R Axis:   -95 Text Interpretation:  Sinus or ectopic atrial tachycardia Probable left atrial enlargement Right bundle branch block Inferior infarct, acute (LCx) Anterior infarct, acute Lateral leads are also involved Since last tracing rate faster and nonspecific  St abnormality Confirmed by Eulis Foster  MD, Leane Loring [phone]) on 07/04/2016 1:17:48 PM       Radiology Dg Chest Port 1 View  Result Date: 07/04/2016 CLINICAL DATA:  Shortness of breath. EXAM: PORTABLE CHEST 1 VIEW COMPARISON:  Radiographs of May 05, 2016. FINDINGS: Stable cardiomediastinal silhouette. No pneumothorax or pleural effusion is noted. Stable elevation of left hemidiaphragm is noted. Right lung is clear. Minimal left basilar subsegmental atelectasis is noted. Bony thorax is unremarkable. IMPRESSION: Stable elevation left hemidiaphragm with minimal left basilar subsegmental atelectasis. No other abnormality seen in the chest. Electronically Signed   By: Marijo Conception, M.D.   On: 07/04/2016 14:02    Procedures Procedures (including critical care time)  Medications Ordered in ED Medications  sodium chloride 0.9 % bolus 1,500 mL (not administered)  cefTRIAXone (ROCEPHIN) 1 g in dextrose 5 % 50 mL IVPB (0 g Intravenous Stopped 07/04/16 1602)  azithromycin (ZITHROMAX) 500 mg in dextrose 5 % 250 mL IVPB (500 mg Intravenous New Bag/Given 07/04/16 1511)  oseltamivir (TAMIFLU) capsule 75 mg (75 mg Oral Given 07/04/16 1603)  sodium chloride 0.9 % bolus 1,000 mL (1,000 mLs Intravenous New Bag/Given 07/04/16 1428)  acetaminophen (TYLENOL) tablet 650 mg (650 mg Oral Given 07/04/16 1603)     Initial Impression / Assessment and Plan / ED Course  I have reviewed the triage vital signs and the nursing notes.  Pertinent labs & imaging results that were available during my care of the patient were reviewed by me and considered in my medical decision making (see chart for details).  Clinical Course as of Jul 05 1635  Fri Jul 04, 2016  1526 Elevated Lactic Acid, Venous: (!!) 2.53 [EW]  1526 Normal WBC: 8.8 [EW]  1526 Normal BP: 137/75 [EW]  1526 DG Chest Port 1 View [EW]  1526 No acute disease DG Chest Port 1 View [EW]    Clinical Course User Index [EW] Daleen Bo, MD    Medications   sodium chloride 0.9 % bolus 1,500 mL (not administered)  cefTRIAXone (ROCEPHIN) 1 g in dextrose 5 % 50 mL IVPB (0 g Intravenous Stopped  07/04/16 1602)  azithromycin (ZITHROMAX) 500 mg in dextrose 5 % 250 mL IVPB (500 mg Intravenous New Bag/Given 07/04/16 1511)  oseltamivir (TAMIFLU) capsule 75 mg (75 mg Oral Given 07/04/16 1603)  sodium chloride 0.9 % bolus 1,000 mL (1,000 mLs Intravenous New Bag/Given 07/04/16 1428)  acetaminophen (TYLENOL) tablet 650 mg (650 mg Oral Given 07/04/16 1603)    Patient Vitals for the past 24 hrs:  BP Temp Temp src Pulse Resp SpO2 Height Weight  07/04/16 1545 121/75 - - 112 26 96 % - -  07/04/16 1515 119/100 - - 114 (!) 30 97 % - -  07/04/16 1514 - 102.8 F (39.3 C) Rectal - - - - -  07/04/16 1500 137/75 - - 114 26 96 % - -  07/04/16 1445 137/71 - - - (!) 27 - - -  07/04/16 1430 140/71 - - 113 26 96 % - -  07/04/16 1415 109/93 - - - 23 - - -  07/04/16 1400 127/72 - - 118 (!) 27 97 % - -  07/04/16 1345 126/70 - - - 22 - - -  07/04/16 1330 (!) 137/104 - - - 25 - - -  07/04/16 1316 137/74 99.9 F (37.7 C) Oral 118 (!) 37 100 % - -  07/04/16 1315 - - - - - - 5\' 5"  (1.651 m) 175 lb (79.4 kg)  07/04/16 1311 - - - - - 100 % - -    4:36 PM Reevaluation with update and discussion. After initial assessment and treatment, an updated evaluation reveals Patient has began to wheeze some. Since receiving the high-volume saline boluses. However, her oxygen saturation 98%. Appear to be in respiratory distress.Daleen Bo L    4:16 PM-Consult complete with Hospitalist. Patient case explained and discussed. He agrees to admit patient for further evaluation and treatment. Call ended at 16:32  Final Clinical Impressions(s) / ED Diagnoses   Final diagnoses:  Influenza   Confusion with fever, mildly elevated lactate. She required nebulizers for wheezing. She has clinical signs and symptoms of influenza with secondary confusion. Doubt CVA, severe bacterial sepsis or  metabolic instability. Persistent tachycardia, possibly related to fever.  Nursing Notes Reviewed/ Care Coordinated Applicable Imaging Reviewed Interpretation of Laboratory Data incorporated into ED treatment  Plan: Admit  New Prescriptions New Prescriptions   No medications on file     Daleen Bo, MD 07/04/16 1637

## 2016-07-04 NOTE — ED Triage Notes (Signed)
Pt arrives EMS from home with c/o increased SHOB x 2 days. HX of CHF and CVA x 7 since OCT with residual weakness ion R side. Pt received albuterol neb x 2 for 10 mg by EMS.

## 2016-07-04 NOTE — ED Notes (Signed)
Pt had large bowel movement.

## 2016-07-05 DIAGNOSIS — J441 Chronic obstructive pulmonary disease with (acute) exacerbation: Secondary | ICD-10-CM | POA: Diagnosis not present

## 2016-07-05 DIAGNOSIS — Z825 Family history of asthma and other chronic lower respiratory diseases: Secondary | ICD-10-CM | POA: Diagnosis not present

## 2016-07-05 DIAGNOSIS — E46 Unspecified protein-calorie malnutrition: Secondary | ICD-10-CM | POA: Diagnosis present

## 2016-07-05 DIAGNOSIS — E861 Hypovolemia: Secondary | ICD-10-CM | POA: Diagnosis present

## 2016-07-05 DIAGNOSIS — I69319 Unspecified symptoms and signs involving cognitive functions following cerebral infarction: Secondary | ICD-10-CM | POA: Diagnosis not present

## 2016-07-05 DIAGNOSIS — F0390 Unspecified dementia without behavioral disturbance: Secondary | ICD-10-CM | POA: Diagnosis present

## 2016-07-05 DIAGNOSIS — I69393 Ataxia following cerebral infarction: Secondary | ICD-10-CM | POA: Diagnosis not present

## 2016-07-05 DIAGNOSIS — J111 Influenza due to unidentified influenza virus with other respiratory manifestations: Secondary | ICD-10-CM

## 2016-07-05 DIAGNOSIS — J09X2 Influenza due to identified novel influenza A virus with other respiratory manifestations: Secondary | ICD-10-CM | POA: Diagnosis present

## 2016-07-05 DIAGNOSIS — R531 Weakness: Secondary | ICD-10-CM | POA: Diagnosis not present

## 2016-07-05 DIAGNOSIS — N183 Chronic kidney disease, stage 3 (moderate): Secondary | ICD-10-CM | POA: Diagnosis not present

## 2016-07-05 DIAGNOSIS — J4541 Moderate persistent asthma with (acute) exacerbation: Secondary | ICD-10-CM | POA: Diagnosis present

## 2016-07-05 DIAGNOSIS — Z79899 Other long term (current) drug therapy: Secondary | ICD-10-CM | POA: Diagnosis not present

## 2016-07-05 DIAGNOSIS — J9600 Acute respiratory failure, unspecified whether with hypoxia or hypercapnia: Secondary | ICD-10-CM | POA: Diagnosis not present

## 2016-07-05 DIAGNOSIS — J9601 Acute respiratory failure with hypoxia: Secondary | ICD-10-CM | POA: Diagnosis not present

## 2016-07-05 DIAGNOSIS — N179 Acute kidney failure, unspecified: Secondary | ICD-10-CM | POA: Diagnosis present

## 2016-07-05 DIAGNOSIS — I872 Venous insufficiency (chronic) (peripheral): Secondary | ICD-10-CM | POA: Diagnosis present

## 2016-07-05 DIAGNOSIS — A419 Sepsis, unspecified organism: Secondary | ICD-10-CM | POA: Diagnosis present

## 2016-07-05 DIAGNOSIS — Z8249 Family history of ischemic heart disease and other diseases of the circulatory system: Secondary | ICD-10-CM | POA: Diagnosis not present

## 2016-07-05 DIAGNOSIS — Z820 Family history of epilepsy and other diseases of the nervous system: Secondary | ICD-10-CM | POA: Diagnosis not present

## 2016-07-05 DIAGNOSIS — Z9849 Cataract extraction status, unspecified eye: Secondary | ICD-10-CM | POA: Diagnosis not present

## 2016-07-05 DIAGNOSIS — H5461 Unqualified visual loss, right eye, normal vision left eye: Secondary | ICD-10-CM | POA: Diagnosis present

## 2016-07-05 DIAGNOSIS — H9190 Unspecified hearing loss, unspecified ear: Secondary | ICD-10-CM | POA: Diagnosis present

## 2016-07-05 DIAGNOSIS — I878 Other specified disorders of veins: Secondary | ICD-10-CM | POA: Diagnosis present

## 2016-07-05 DIAGNOSIS — I509 Heart failure, unspecified: Secondary | ICD-10-CM | POA: Diagnosis present

## 2016-07-05 DIAGNOSIS — E872 Acidosis: Secondary | ICD-10-CM | POA: Diagnosis present

## 2016-07-05 LAB — BASIC METABOLIC PANEL
Anion gap: 8 (ref 5–15)
BUN: 14 mg/dL (ref 6–20)
CALCIUM: 7.7 mg/dL — AB (ref 8.9–10.3)
CHLORIDE: 107 mmol/L (ref 101–111)
CO2: 23 mmol/L (ref 22–32)
CREATININE: 1.12 mg/dL — AB (ref 0.44–1.00)
GFR calc non Af Amer: 41 mL/min — ABNORMAL LOW (ref >60)
GFR, EST AFRICAN AMERICAN: 48 mL/min — AB (ref >60)
Glucose, Bld: 123 mg/dL — ABNORMAL HIGH (ref 65–99)
Potassium: 4.5 mmol/L (ref 3.5–5.1)
SODIUM: 138 mmol/L (ref 135–145)

## 2016-07-05 LAB — BLOOD GAS, ARTERIAL
ACID-BASE DEFICIT: 3.1 mmol/L — AB (ref 0.0–2.0)
Bicarbonate: 20.9 mmol/L (ref 20.0–28.0)
DRAWN BY: 305991
FIO2: 21
O2 SAT: 95 %
PCO2 ART: 34.2 mmHg (ref 32.0–48.0)
PO2 ART: 79.7 mmHg — AB (ref 83.0–108.0)
Patient temperature: 98.6
pH, Arterial: 7.402 (ref 7.350–7.450)

## 2016-07-05 LAB — URINE CULTURE: Culture: NO GROWTH

## 2016-07-05 LAB — CBC
HCT: 38 % (ref 36.0–46.0)
HEMOGLOBIN: 12.2 g/dL (ref 12.0–15.0)
MCH: 29.5 pg (ref 26.0–34.0)
MCHC: 32.1 g/dL (ref 30.0–36.0)
MCV: 91.8 fL (ref 78.0–100.0)
Platelets: 194 10*3/uL (ref 150–400)
RBC: 4.14 MIL/uL (ref 3.87–5.11)
RDW: 15.5 % (ref 11.5–15.5)
WBC: 5.9 10*3/uL (ref 4.0–10.5)

## 2016-07-05 LAB — GLUCOSE, CAPILLARY: Glucose-Capillary: 142 mg/dL — ABNORMAL HIGH (ref 65–99)

## 2016-07-05 LAB — LACTIC ACID, PLASMA
LACTIC ACID, VENOUS: 1.7 mmol/L (ref 0.5–1.9)
LACTIC ACID, VENOUS: 2.2 mmol/L — AB (ref 0.5–1.9)

## 2016-07-05 MED ORDER — ENSURE ENLIVE PO LIQD
237.0000 mL | Freq: Four times a day (QID) | ORAL | Status: DC
  Administered 2016-07-06 – 2016-07-08 (×10): 237 mL via ORAL

## 2016-07-05 NOTE — Progress Notes (Signed)
CRITICAL VALUE ALERT  Critical value received:  Lactic acid of 2.2  Date of notification:  07/05/16   Time of notification:  12:40 pm  Critical value read back:Yes.    Nurse who received alert:  Cristopher Peru   MD notified (1st page):  Dhungel   Time of first page:  12:40 pm

## 2016-07-05 NOTE — Progress Notes (Signed)
Initial Nutrition Assessment  DOCUMENTATION CODES:   Obesity unspecified  INTERVENTION:  Provide Ensure Enlive po QID, each supplement provides 350 kcal and 20 grams of protein. As patient becomes less lethargic and intake improves, can decrease Ensure as typical intake per report seems adequate.  NUTRITION DIAGNOSIS:   Inadequate oral intake related to poor appetite, lethargy/confusion as evidenced by per patient/family report.  GOAL:   Patient will meet greater than or equal to 90% of their needs  MONITOR:   PO intake, Supplement acceptance, Labs, Weight trends, I & O's  REASON FOR ASSESSMENT:   Consult Assessment of nutrition requirement/status  ASSESSMENT:   82 year old female with prior history of multiple strokes with residual weakness, hard of hearing, protein calorie malnutrition, venous insufficiency, COPD who presented to the ED with cough, wheezing and shortness of breath for 2 weeks duration. Found to have ARF secondary to influenza A, asthma/COPD exacerbation.    -Of note, protein calorie malnutrition listed on problem list, but patient has not been diagnosed with PCM by an RD with [facility] on any three previous assessments. Patient does not meet criteria for malnutrition at time of this assessment, either.  Patient sleeping at time of assessment, but spoke with husband at bedside. He reports her appetite and intake have been acutely poor the past 2 days in setting of lethargy. Today she has only taken 3-4 bites of food at a time and yesterday she had nothing to eat. He reports patient typically has a good appetite. She usually eats breakfast, lunch and dinner and snacks in between.   Husband reports last known UBW he was aware of was 165 lbs, but that it is difficult to weigh patient as she is wheel-chair bound. Per chart patient has been gaining weight this past year - some weight gain may be in setting of fluid/edema.  Medications reviewed and include:  doxycycline, Lasix 20 mg daily, methylprednisolone 60 mg Q12hrs, pantoprazole, pyridoxine 50 mg daily, NS @ 50 ml/hr.  Labs reviewed: CBG 142, Creatinine 1.12.   Nutrition-Focused physical exam completed. Findings are no fat depletion, no muscle depletion, and severe edema. Husband reports that although patient is wheel-chair bound, she is very strong at baseline and is able to lift herself up to stand when needed.  Discussed with RN. Patient too lethargic to eat regularly at this time.  Diet Order:  Diet regular Room service appropriate? Yes; Fluid consistency: Thin  Skin:  Reviewed, no issues  Last BM:  07/05/2016  Height:   Ht Readings from Last 1 Encounters:  07/04/16 5\' 4"  (1.626 m)    Weight:   Wt Readings from Last 1 Encounters:  07/04/16 177 lb 0.5 oz (80.3 kg)    Ideal Body Weight:  54.5 kg  BMI:  Body mass index is 30.39 kg/m.  Estimated Nutritional Needs:   Kcal:  1500-1800 (HBE x 1.2-1.4)  Protein:  95-110 grams (1.2-1.4 grams/kg)  Fluid:  >/= 2 L/day (25 ml/kg)  EDUCATION NEEDS:   No education needs identified at this time  Willey Blade, MS, RD, LDN Pager: [phone] After Hours Pager: [phone]

## 2016-07-05 NOTE — Evaluation (Signed)
Physical Therapy Evaluation Patient Details Name: Darlene Murray MRN: [id number] DOB: [birth year] Today's Date: 07/05/2016   History of Present Illness  Patient is a 82 yo female admitted 07/04/16 with acute resp failure with hypoxia due to Influenza A, weakness.   PMH:  multiple CVA's (bilaterally) with residual weakness, HLD, CKD, venous stasis with dependent edema, COPD, dementia/memory deficit, CHF, legally blind Rt eye, HOH    Clinical Impression  Patient presents with problems listed below.  Will benefit from acute PT to maximize functional mobility prior to return home with husband.  Patient has had multiple strokes, each impacting patient's function.  Patient at w/c level pta, able to propel w/c with UE's.  However husband using lift equipment for transfers. Recommend f/u HHPT for continued therapy for strengthening/mobility training.    Follow Up Recommendations Home health PT;Supervision/Assistance - 24 hour    Equipment Recommendations  None recommended by PT    Recommendations for Other Services       Precautions / Restrictions Precautions Precautions: Fall Restrictions Weight Bearing Restrictions: No      Mobility  Bed Mobility Overal bed mobility: Needs Assistance Bed Mobility: Supine to Sit;Sit to Supine     Supine to sit: Mod assist;HOB elevated Sit to supine: Mod assist;+2 for physical assistance;HOB elevated   General bed mobility comments: Verbal cues for technique.  Patient able to initiate moving LE's off of bed.  Assist with bed pads to move patient's hips toward EOB.  Mod assist to bring trunk to upright sitting position and to scoot toward EOB in sitting.  Patient able to sit EOB x 15 minutes with min guard assist.  Patient able to perform LE exercises with assist for balance.  Required +2 assist to move to supine and scoot toward HOB.  Transfers                 General transfer comment: NT  Ambulation/Gait                Stairs             Wheelchair Mobility    Modified Rankin (Stroke Patients Only)       Balance Overall balance assessment: Needs assistance Sitting-balance support: No upper extremity supported;Feet supported Sitting balance-Leahy Scale: Fair Sitting balance - Comments: Able to maintain static sitting balance.  Required assist for balance during dynamic activities. Postural control: Other (comment) (Flexed posture)                                   Pertinent Vitals/Pain Pain Assessment: No/denies pain    Home Living Family/patient expects to be discharged to:: Private residence (Husband wants to keep patient at home) Living Arrangements: Spouse/significant other Available Help at Discharge: Family;Available 24 hours/day (Husband 49*; son lives nearby) Type of Home: House Home Access: Level entry     Home Layout: One level Home Equipment: Environmental consultant - 2 wheels;Cane - single point;Grab bars - tub/shower;Shower seat;Bedside commode;Wheelchair - Education administrator (comment) Firefighter (sound like a Stedy))      Prior Function Level of Independence: Needs assistance   Gait / Transfers Assistance Needed: Husband uses lift to move patient bed <> w/c <> shower chair/BSC.  Patient stands into lift and then husband rolls her to the next location - sounds like a Stedy.  ADL's / Homemaking Assistance Needed: Husband assist with bathing, dressing, toileting, meal prep, housekeepine.  Patient is able to feed herself.  Hand Dominance   Dominant Hand: Right    Extremity/Trunk Assessment   Upper Extremity Assessment Upper Extremity Assessment: Defer to OT evaluation;RUE deficits/detail RUE Deficits / Details: Decreased ROM Rt shoulder    Lower Extremity Assessment Lower Extremity Assessment: RLE deficits/detail;LLE deficits/detail RLE Deficits / Details: Strength grossly 3-/5 to 3/5.  Edema noted. RLE Coordination: decreased gross motor LLE Deficits / Details: Strength  grossly 3/5.  Edema noted LLE Coordination: decreased gross motor    Cervical / Trunk Assessment Cervical / Trunk Assessment: Kyphotic (with head downward in sitting)  Communication   Communication: No difficulties;HOH (Blind Rt eye)  Cognition Arousal/Alertness: Lethargic;Awake/alert (More alert once in sitting.) Behavior During Therapy: Flat affect Overall Cognitive Status: History of cognitive impairments - at baseline                      General Comments      Exercises General Exercises - Lower Extremity Ankle Circles/Pumps: AROM;Both;10 reps;Seated Long Arc Quad: AROM;Both;10 reps;Seated Hip ABduction/ADduction: AROM;Both;10 reps;Seated Hip Flexion/Marching: AROM;Both;5 reps;Seated   Assessment/Plan    PT Assessment Patient needs continued PT services  PT Problem List Decreased strength;Decreased range of motion;Decreased activity tolerance;Decreased balance;Decreased mobility;Decreased coordination;Decreased cognition;Cardiopulmonary status limiting activity          PT Treatment Interventions DME instruction;Gait training;Functional mobility training;Therapeutic activities;Therapeutic exercise;Balance training;Cognitive remediation;Patient/family education;Wheelchair mobility training    PT Goals (Current goals can be found in the Care Plan section)  Acute Rehab PT Goals Patient Stated Goal: None stated PT Goal Formulation: With patient/family Time For Goal Achievement: 07/12/16 Potential to Achieve Goals: Good    Frequency Min 3X/week   Barriers to discharge        Co-evaluation               End of Session Equipment Utilized During Treatment: Oxygen Activity Tolerance: Patient limited by fatigue;Patient limited by lethargy Patient left: in bed;with call bell/phone within reach;with bed alarm set;with family/visitor present Nurse Communication: Mobility status;Need for lift equipment    Functional Assessment Tool Used: Clinical  judgement Functional Limitation: Mobility: Walking and moving around Mobility: Walking and Moving Around Current Status [phone]): At least 40 percent but less than 60 percent impaired, limited or restricted Mobility: Walking and Moving Around Goal Status [phone]): At least 20 percent but less than 40 percent impaired, limited or restricted    Time: [id number] PT Time Calculation (min) (ACUTE ONLY): 25 min   Charges:   PT Evaluation $PT Eval Moderate Complexity: 1 Procedure PT Treatments $Therapeutic Activity: 8-22 mins   PT G Codes:   PT G-Codes **NOT FOR INPATIENT CLASS** Functional Assessment Tool Used: Clinical judgement Functional Limitation: Mobility: Walking and moving around Mobility: Walking and Moving Around Current Status [id number]): At least 40 percent but less than 60 percent impaired, limited or restricted Mobility: Walking and Moving Around Goal Status [phone]): At least 20 percent but less than 40 percent impaired, limited or restricted    Despina Pole 07/05/2016, 7:53 PM Carita Pian. Sanjuana Kava, [place] Pager [phone]

## 2016-07-05 NOTE — Progress Notes (Signed)
PROGRESS NOTE                                                                                                                                                                                                             Patient Demographics:    Darlene Murray, is a 82 y.o. female, DOB - Oct 10, 1921, [id number]  Admit date - 07/04/2016   Admitting Physician [name], MD  Outpatient Primary MD for the patient is Gennette Pac, MD  LOS - 0  Outpatient Specialists: Pulmonary  Chief Complaint  Patient presents with  . Shortness of Breath       Brief Narrative   82 year old female with prior history of multiple strokes with residual weakness, hard of hearing, protein calorie malnutrition, venous insufficiency, COPD who presented to the ED with cough, wheezing and shortness of breath for 2 weeks duration . She was seen by her pulmonologist on 12/21 and started on steroid taper. Symptoms improved but again became worse past 3 days without improvement with home breathing treatment. Patient also had generalized weakness with poor by mouth intake and body aches. She denied chest pain, palpitation, abdominal pain, bowel or urinary symptoms. In the ED she was septic with fever of 102.8 Fahrenheit, tachypneic and tachycardic. Had mild acute kidney injury on labs.  Sepsis pathway initiated and admitted to hospitalist service. Flu PCR was positive for influenza.   Subjective:   Patient seems confused (possibly at baseline). During the day she was quite somnolent and difficult to arouse. Vitals and CBG checked were normal. She woke up shortly upon sternal rub and fell back asleep snoring. ABG done mild hypoxemia.  Assessment  & Plan :   Principle problem Acute hypoxic respiratory failure secondary to influenza A - started Tamiflu. Follow respiratory viral panel. DuoNeb and IV Solu-Medrol. Mucinex. -Continue O2 via nasal  cannula. -DC antibiotics.  Asthma/COPD exacerbation Continue empiric Solu-Medrol and DuoNeb. O2 via nasal cannula. Discontinue antibiotics.  Lactic acidosis Suspected due to poor by mouth intake and underlying infection. Subsequent level improving. Given IV hydration.  History of CHF Hypovolemic. Received hydration.    Chronic kidney disease stage? 2 Stable at baseline  Dementia On namenda  Hx of CVA continue aggrenox and statin     Code Status : Full code  Family Communication  : Updated her son on the phone  Disposition Plan  : Home once improved  Barriers For Discharge : Active symptoms  Consults  : NONE  Procedures  : none  DVT Prophylaxis  :  Lovenox   Lab Results  Component Value Date   PLT 194 07/05/2016    Antibiotics  :   Anti-infectives    Start     Dose/Rate Route Frequency Ordered Stop   07/05/16 1000  oseltamivir (TAMIFLU) 6 MG/ML suspension 75 mg  Status:  Discontinued     75 mg Oral 2 times daily 07/04/16 1825 07/04/16 2013   07/05/16 1000  oseltamivir (TAMIFLU) 6 MG/ML suspension 30 mg     30 mg Oral 2 times daily 07/04/16 2016 07/10/16 0959   07/04/16 2130  doxycycline (VIBRAMYCIN) 100 mg in dextrose 5 % 250 mL IVPB     100 mg 125 mL/hr over 120 Minutes Intravenous Every 12 hours 07/04/16 1825     07/04/16 1345  cefTRIAXone (ROCEPHIN) 1 g in dextrose 5 % 50 mL IVPB     1 g 100 mL/hr over 30 Minutes Intravenous  Once 07/04/16 1335 07/04/16 1602   07/04/16 1345  azithromycin (ZITHROMAX) 500 mg in dextrose 5 % 250 mL IVPB     500 mg 250 mL/hr over 60 Minutes Intravenous  Once 07/04/16 1335 07/04/16 1723   07/04/16 1345  oseltamivir (TAMIFLU) capsule 75 mg     75 mg Oral  Once 07/04/16 1335 07/04/16 1603        Objective:   Vitals:   07/05/16 0844 07/05/16 0851 07/05/16 1216 07/05/16 1302  BP:  129/60  (!) 118/51  Pulse:  71  69  Resp:    18  Temp:  97.2 F (36.2 C)  97.7 F (36.5 C)  TempSrc:  Oral  Oral  SpO2: 95% 100% 93%  98%  Weight:      Height:        Wt Readings from Last 3 Encounters:  07/04/16 80.3 kg (177 lb 0.5 oz)  05/22/16 79.8 kg (176 lb)  11/08/15 76.6 kg (168 lb 12.8 oz)     Intake/Output Summary (Last 24 hours) at 07/05/16 1437 Last data filed at 07/05/16 0533  Gross per 24 hour  Intake          1233.34 ml  Output                0 ml  Net          1233.34 ml     Physical Exam  Gen: not in distress hard of hearing  HEENT: , moist mucosa, supple neck Chest: clear b/l, no added sounds CVS: N S1&S2, no murmurs GI: soft, NT, ND, BS+ Musculoskeletal: warm, no edema CNS: AAOx2, mild right sided weakness    Data Review:    CBC  Recent Labs Lab 07/04/16 1420 07/05/16 0629  WBC 8.8 5.9  HGB 14.5 12.2  HCT 44.0 38.0  PLT 214 194  MCV 92.2 91.8  MCH 30.4 29.5  MCHC 33.0 32.1  RDW 15.3 15.5  LYMPHSABS 1.5  --   MONOABS 1.4*  --   EOSABS 0.2  --   BASOSABS 0.0  --     Chemistries   Recent Labs Lab 07/04/16 1420 07/05/16 0629  NA 142 138  K 3.4* 4.5  CL 106 107  CO2 23 23  GLUCOSE 97 123*  BUN 17 14  CREATININE 1.16* 1.12*  CALCIUM 8.9 7.7*  AST 31  --   ALT 32  --  ALKPHOS 57  --   BILITOT 0.9  --    ------------------------------------------------------------------------------------------------------------------ No results for input(s): CHOL, HDL, LDLCALC, TRIG, CHOLHDL, LDLDIRECT in the last 72 hours.  Lab Results  Component Value Date   HGBA1C 5.7 (H) 09/12/2015   ------------------------------------------------------------------------------------------------------------------ No results for input(s): TSH, T4TOTAL, T3FREE, THYROIDAB in the last 72 hours.  Invalid input(s): FREET3 ------------------------------------------------------------------------------------------------------------------ No results for input(s): VITAMINB12, FOLATE, FERRITIN, TIBC, IRON, RETICCTPCT in the last 72 hours.  Coagulation profile No results for input(s): INR,  PROTIME in the last 168 hours.  No results for input(s): DDIMER in the last 72 hours.  Cardiac Enzymes  Recent Labs Lab 07/04/16 2011  TROPONINI <0.03   ------------------------------------------------------------------------------------------------------------------    Component Value Date/Time   BNP 14.1 12/16/2014 1025    Inpatient Medications  Scheduled Meds: . atorvastatin  10 mg Oral q1800  . clopidogrel  75 mg Oral Daily  . dipyridamole-aspirin  1 capsule Oral BID  . doxycycline (VIBRAMYCIN) IV  100 mg Intravenous Q12H  . furosemide  20 mg Oral Daily  . guaiFENesin  20 mL Oral QID  . ipratropium-albuterol  3 mL Nebulization Q4H  . memantine  10 mg Oral BID  . methylPREDNISolone (SOLU-MEDROL) injection  60 mg Intravenous Q12H  . mometasone-formoterol  2 puff Inhalation BID  . oseltamivir  30 mg Oral BID  . pantoprazole  40 mg Oral q morning - 10a  . pyridOXINE  50 mg Oral q morning - 10a  . sodium chloride flush  3 mL Intravenous Q12H   Continuous Infusions: . sodium chloride 50 mL/hr at 07/05/16 0719   PRN Meds:.acetaminophen **OR** acetaminophen, famotidine, ondansetron **OR** ondansetron (ZOFRAN) IV  Micro Results Recent Results (from the past 240 hour(s))  Blood Culture (routine x 2)     Status: None (Preliminary result)   Collection Time: 07/04/16  2:20 PM  Result Value Ref Range Status   Specimen Description BLOOD LEFT ANTECUBITAL  Final   Special Requests BOTTLES DRAWN AEROBIC AND ANAEROBIC 5CC  Final   Culture NO GROWTH < 24 HOURS  Final   Report Status PENDING  Incomplete  Blood Culture (routine x 2)     Status: None (Preliminary result)   Collection Time: 07/04/16  2:50 PM  Result Value Ref Range Status   Specimen Description BLOOD RIGHT WRIST  Final   Special Requests BOTTLES DRAWN AEROBIC AND ANAEROBIC 5CC  Final   Culture NO GROWTH < 24 HOURS  Final   Report Status PENDING  Incomplete  Urine culture     Status: None   Collection Time:  07/04/16  3:35 PM  Result Value Ref Range Status   Specimen Description URINE, CATHETERIZED  Final   Special Requests NONE  Final   Culture NO GROWTH  Final   Report Status 07/05/2016 FINAL  Final    Radiology Reports Dg Chest Port 1 View  Result Date: 07/04/2016 CLINICAL DATA:  Shortness of breath. EXAM: PORTABLE CHEST 1 VIEW COMPARISON:  Radiographs of May 05, 2016. FINDINGS: Stable cardiomediastinal silhouette. No pneumothorax or pleural effusion is noted. Stable elevation of left hemidiaphragm is noted. Right lung is clear. Minimal left basilar subsegmental atelectasis is noted. Bony thorax is unremarkable. IMPRESSION: Stable elevation left hemidiaphragm with minimal left basilar subsegmental atelectasis. No other abnormality seen in the chest. Electronically Signed   By: Marijo Conception, M.D.   On: 07/04/2016 14:02    Time Spent in minutes  25   Louellen Molder M.D on 07/05/2016 at 2:37 PM  Between 7am to 7pm - Pager - [phone]  After 7pm go to [email or web address] - password [email or web address]  [facility] -  Office  [phone]

## 2016-07-05 NOTE — Progress Notes (Signed)
PT.had incontince of urine x1 last night & noted HNV since then, bladder scan done & obtained 640cc,called MD on call .

## 2016-07-06 MED ORDER — IPRATROPIUM-ALBUTEROL 0.5-2.5 (3) MG/3ML IN SOLN
3.0000 mL | Freq: Four times a day (QID) | RESPIRATORY_TRACT | Status: DC
  Administered 2016-07-06 (×2): 3 mL via RESPIRATORY_TRACT
  Filled 2016-07-06 (×3): qty 3

## 2016-07-06 MED ORDER — IPRATROPIUM-ALBUTEROL 0.5-2.5 (3) MG/3ML IN SOLN
3.0000 mL | Freq: Three times a day (TID) | RESPIRATORY_TRACT | Status: DC
  Administered 2016-07-07 – 2016-07-08 (×5): 3 mL via RESPIRATORY_TRACT
  Filled 2016-07-06 (×5): qty 3

## 2016-07-06 NOTE — Progress Notes (Addendum)
PROGRESS NOTE                                                                                                                                                                                                             Patient Demographics:    Darlene Murray, is a 82 y.o. female, DOB - 06/08/1922, [id number]  Admit date - 07/04/2016   Admitting Physician [name], MD  Outpatient Primary MD for the patient is Gennette Pac, MD  LOS - 1  Outpatient Specialists: Pulmonary  Chief Complaint  Patient presents with  . Shortness of Breath       Brief Narrative   82 year old female with prior history of multiple strokes with residual weakness, hard of hearing, protein calorie malnutrition, venous insufficiency, COPD who presented to the ED with cough, wheezing and shortness of breath for 2 weeks duration . She was seen by her pulmonologist on 12/21 and started on steroid taper. Symptoms improved but again became worse past 3 days without improvement with home breathing treatment. Patient also had generalized weakness with poor by mouth intake and body aches. She denied chest pain, palpitation, abdominal pain, bowel or urinary symptoms. In the ED she was septic with fever of 102.8 Fahrenheit, tachypneic and tachycardic. Had mild acute kidney injury on labs.  Sepsis pathway initiated and admitted to hospitalist service. Flu PCR was positive for influenza.   Subjective:   Sepsis resolved and patient appears alert and oriented today. Afebrile.  Assessment  & Plan :   Principle problem Acute hypoxic respiratory failure secondary to influenza A - Continue Tamiflu. Follow respiratory viral panel. DuoNeb and IV Solu-Medrol. Mucinex. -Continue O2 via nasal cannula. (As tolerated) -Off antibiotics.  Asthma/COPD exacerbation Continue empiric Solu-Medrol and DuoNeb. O2 via nasal cannula. Off antibiotics.  Lactic  acidosis Suspected due to poor by mouth intake and underlying infection. Subsequent level improving. Discontinue fluids.  History of CHF Received fluids for hypovolemia. Now euvolemic.   Chronic kidney disease stage? 2 Stable at baseline  Dementia On namenda  Hx of CVA continue aggrenox and statin     Code Status : DO NOT INTUBATE, no CPR but okay with ACLS medication and defibrillation. (as per husband at bedside)).    Family Communication  : Updated husband at bedside on 12/30.  Disposition Plan  : PT recommends home with  home health. Possibly discharge tomorrow if improving. Barriers For Discharge : Active symptoms  Consults  : NONE  Procedures  : none  DVT Prophylaxis  :  Lovenox   Lab Results  Component Value Date   PLT 194 07/05/2016    Antibiotics  :   Anti-infectives    Start     Dose/Rate Route Frequency Ordered Stop   07/05/16 1000  oseltamivir (TAMIFLU) 6 MG/ML suspension 75 mg  Status:  Discontinued     75 mg Oral 2 times daily 07/04/16 1825 07/04/16 2013   07/05/16 1000  oseltamivir (TAMIFLU) 6 MG/ML suspension 30 mg     30 mg Oral 2 times daily 07/04/16 2016 07/10/16 0959   07/04/16 2130  doxycycline (VIBRAMYCIN) 100 mg in dextrose 5 % 250 mL IVPB     100 mg 125 mL/hr over 120 Minutes Intravenous Every 12 hours 07/04/16 1825     07/04/16 1345  cefTRIAXone (ROCEPHIN) 1 g in dextrose 5 % 50 mL IVPB     1 g 100 mL/hr over 30 Minutes Intravenous  Once 07/04/16 1335 07/04/16 1602   07/04/16 1345  azithromycin (ZITHROMAX) 500 mg in dextrose 5 % 250 mL IVPB     500 mg 250 mL/hr over 60 Minutes Intravenous  Once 07/04/16 1335 07/04/16 1723   07/04/16 1345  oseltamivir (TAMIFLU) capsule 75 mg     75 mg Oral  Once 07/04/16 1335 07/04/16 1603        Objective:   Vitals:   07/05/16 2036 07/05/16 2108 07/06/16 0609 07/06/16 0911  BP: (!) 110/53  (!) 131/53   Pulse: 73  64   Resp: 20  18   Temp: 98.1 F (36.7 C)  97.4 F (36.3 C)   TempSrc:    Oral   SpO2: 96% 98% 100% 100%  Weight:      Height:        Wt Readings from Last 3 Encounters:  07/04/16 80.3 kg (177 lb 0.5 oz)  05/22/16 79.8 kg (176 lb)  11/08/15 76.6 kg (168 lb 12.8 oz)     Intake/Output Summary (Last 24 hours) at 07/06/16 1142 Last data filed at 07/05/16 1531  Gross per 24 hour  Intake              660 ml  Output                0 ml  Net              660 ml     Physical Exam  Gen: not in distress HEENT: , moist mucosa, supple neck Chest: Scattered rhonchi bilaterally CVS: N S1&S2, no murmurs GI: soft, NT, ND,  Musculoskeletal: warm, no edema CNS: AAOx1-2,    Data Review:    CBC  Recent Labs Lab 07/04/16 1420 07/05/16 0629  WBC 8.8 5.9  HGB 14.5 12.2  HCT 44.0 38.0  PLT 214 194  MCV 92.2 91.8  MCH 30.4 29.5  MCHC 33.0 32.1  RDW 15.3 15.5  LYMPHSABS 1.5  --   MONOABS 1.4*  --   EOSABS 0.2  --   BASOSABS 0.0  --     Chemistries   Recent Labs Lab 07/04/16 1420 07/05/16 0629  NA 142 138  K 3.4* 4.5  CL 106 107  CO2 23 23  GLUCOSE 97 123*  BUN 17 14  CREATININE 1.16* 1.12*  CALCIUM 8.9 7.7*  AST 31  --   ALT 32  --  ALKPHOS 57  --   BILITOT 0.9  --    ------------------------------------------------------------------------------------------------------------------ No results for input(s): CHOL, HDL, LDLCALC, TRIG, CHOLHDL, LDLDIRECT in the last 72 hours.  Lab Results  Component Value Date   HGBA1C 5.7 (H) 09/12/2015   ------------------------------------------------------------------------------------------------------------------ No results for input(s): TSH, T4TOTAL, T3FREE, THYROIDAB in the last 72 hours.  Invalid input(s): FREET3 ------------------------------------------------------------------------------------------------------------------ No results for input(s): VITAMINB12, FOLATE, FERRITIN, TIBC, IRON, RETICCTPCT in the last 72 hours.  Coagulation profile No results for input(s): INR, PROTIME in the  last 168 hours.  No results for input(s): DDIMER in the last 72 hours.  Cardiac Enzymes  Recent Labs Lab 07/04/16 2011  TROPONINI <0.03   ------------------------------------------------------------------------------------------------------------------    Component Value Date/Time   BNP 14.1 12/16/2014 1025    Inpatient Medications  Scheduled Meds: . atorvastatin  10 mg Oral q1800  . clopidogrel  75 mg Oral Daily  . dipyridamole-aspirin  1 capsule Oral BID  . doxycycline (VIBRAMYCIN) IV  100 mg Intravenous Q12H  . feeding supplement (ENSURE ENLIVE)  237 mL Oral QID  . furosemide  20 mg Oral Daily  . guaiFENesin  20 mL Oral QID  . ipratropium-albuterol  3 mL Nebulization QID  . memantine  10 mg Oral BID  . methylPREDNISolone (SOLU-MEDROL) injection  60 mg Intravenous Q12H  . mometasone-formoterol  2 puff Inhalation BID  . oseltamivir  30 mg Oral BID  . pantoprazole  40 mg Oral q morning - 10a  . pyridOXINE  50 mg Oral q morning - 10a  . sodium chloride flush  3 mL Intravenous Q12H   Continuous Infusions:  PRN Meds:.acetaminophen **OR** acetaminophen, famotidine, ondansetron **OR** ondansetron (ZOFRAN) IV  Micro Results Recent Results (from the past 240 hour(s))  Blood Culture (routine x 2)     Status: None (Preliminary result)   Collection Time: 07/04/16  2:20 PM  Result Value Ref Range Status   Specimen Description BLOOD LEFT ANTECUBITAL  Final   Special Requests BOTTLES DRAWN AEROBIC AND ANAEROBIC 5CC  Final   Culture NO GROWTH < 24 HOURS  Final   Report Status PENDING  Incomplete  Blood Culture (routine x 2)     Status: None (Preliminary result)   Collection Time: 07/04/16  2:50 PM  Result Value Ref Range Status   Specimen Description BLOOD RIGHT WRIST  Final   Special Requests BOTTLES DRAWN AEROBIC AND ANAEROBIC 5CC  Final   Culture NO GROWTH < 24 HOURS  Final   Report Status PENDING  Incomplete  Urine culture     Status: None   Collection Time: 07/04/16   3:35 PM  Result Value Ref Range Status   Specimen Description URINE, CATHETERIZED  Final   Special Requests NONE  Final   Culture NO GROWTH  Final   Report Status 07/05/2016 FINAL  Final    Radiology Reports Dg Chest Port 1 View  Result Date: 07/04/2016 CLINICAL DATA:  Shortness of breath. EXAM: PORTABLE CHEST 1 VIEW COMPARISON:  Radiographs of May 05, 2016. FINDINGS: Stable cardiomediastinal silhouette. No pneumothorax or pleural effusion is noted. Stable elevation of left hemidiaphragm is noted. Right lung is clear. Minimal left basilar subsegmental atelectasis is noted. Bony thorax is unremarkable. IMPRESSION: Stable elevation left hemidiaphragm with minimal left basilar subsegmental atelectasis. No other abnormality seen in the chest. Electronically Signed   By: Marijo Conception, M.D.   On: 07/04/2016 14:02    Time Spent in minutes  25   Louellen Molder M.D on 07/06/2016 at 11:42  AM  Between 7am to 7pm - Pager - [phone]  After 7pm go to [email or web address] - password [facility]  [facility] -  Office  [phone]

## 2016-07-07 DIAGNOSIS — F03B0 Unspecified dementia, moderate, without behavioral disturbance, psychotic disturbance, mood disturbance, and anxiety: Secondary | ICD-10-CM | POA: Diagnosis present

## 2016-07-07 DIAGNOSIS — J111 Influenza due to unidentified influenza virus with other respiratory manifestations: Secondary | ICD-10-CM | POA: Diagnosis present

## 2016-07-07 DIAGNOSIS — J441 Chronic obstructive pulmonary disease with (acute) exacerbation: Secondary | ICD-10-CM

## 2016-07-07 DIAGNOSIS — J9601 Acute respiratory failure with hypoxia: Secondary | ICD-10-CM | POA: Diagnosis present

## 2016-07-07 DIAGNOSIS — F0390 Unspecified dementia without behavioral disturbance: Secondary | ICD-10-CM | POA: Diagnosis present

## 2016-07-07 MED ORDER — ENSURE ENLIVE PO LIQD: 237.0000 mL | Freq: Four times a day (QID) | ORAL | 12 refills | Status: DC

## 2016-07-07 MED ORDER — PREDNISONE 5 MG PO TABS: 5.0000 mg | ORAL_TABLET | Freq: Every day | ORAL | 5 refills | Status: DC

## 2016-07-07 MED ORDER — OSELTAMIVIR PHOSPHATE 30 MG PO CAPS: 30.0000 mg | ORAL_CAPSULE | Freq: Two times a day (BID) | ORAL | 0 refills | Status: AC

## 2016-07-07 MED ORDER — PREDNISONE 20 MG PO TABS: ORAL_TABLET | ORAL | 0 refills | Status: DC

## 2016-07-07 MED ORDER — PREDNISONE 20 MG PO TABS
40.0000 mg | ORAL_TABLET | Freq: Every day | ORAL | Status: DC
  Administered 2016-07-08: 40 mg via ORAL
  Filled 2016-07-07: qty 2

## 2016-07-07 NOTE — Care Management Note (Signed)
Case Management Note  Patient Details  Name: Darlene Murray MRN: [id number] Date of Birth: 04-20-1922  Subjective/Objective:   Pt lives with spouse, will discharge home with home health RN and PT.  Provided list of agencies, referral made to [facility] per choice.                      Expected Discharge Plan:  [name]  Discharge planning Services  CM Consult  Post Acute Care Choice:  Home Health Choice offered to:  Patient  HH Arranged:  RN, PT [facility] Agency:  [facility]  Status of Service:  Completed, signed off  Girard Cooter, [place] 07/07/2016, 2:38 PM

## 2016-07-07 NOTE — Progress Notes (Signed)
PROGRESS NOTE                                                                                                                                                                                                             Patient Demographics:    Darlene Murray, is a 83 y.o. female, DOB - 03/13/1922, [id number]  Admit date - 07/04/2016   Admitting Physician [name], MD  Outpatient Primary MD for the patient is Gennette Pac, MD  LOS - 2  Outpatient Specialists: Pulmonary  Chief Complaint  Patient presents with  . Shortness of Breath       Brief Narrative   83 year old female with prior history of multiple strokes with residual weakness, hard of hearing, protein calorie malnutrition, venous insufficiency, COPD who presented to the ED with cough, wheezing and shortness of breath for 2 weeks duration . She was seen by her pulmonologist on 12/21 and started on steroid taper. Symptoms improved but again became worse past 3 days without improvement with home breathing treatment. Patient also had generalized weakness with poor by mouth intake and body aches. She denied chest pain, palpitation, abdominal pain, bowel or urinary symptoms. In the ED she was septic with fever of 102.8 Fahrenheit, tachypneic and tachycardic. Had mild acute kidney injury on labs.  Sepsis pathway initiated and admitted to hospitalist service. Flu PCR was positive for influenza.   Subjective:   Remains afebrile. Breathing better  Assessment  & Plan :   Principle problem Acute hypoxic respiratory failure secondary to influenza A - Continue Tamiflu. Yevonne Pax and mucinex. . -wean off o2 -Off antibiotics.  Asthma/COPD exacerbation Transition to oral  prednisone . Continue  DuoNeb.wean o2 .  Lactic acidosis Suspected due to poor by mouth intake and underlying infection. reepat levels improved. Off fluids.  History of CHF Received  fluids for hypovolemia. Now euvolemic.   Chronic kidney disease stage? 2 Stable at baseline  Dementia On namenda  Hx of CVA continue aggrenox and statin     Code Status : DO NOT INTUBATE, no CPR but okay with ACLS medication and defibrillation. (as per husband at bedside)).    Family Communication  : Updated husband at bedside on 12/30.  Disposition Plan  : PT recommends home with home health. Both husband and son are sick with flu. Should be able to  d/c home if continues to improve in am  Barriers For Discharge : Active symptoms  Consults  : NONE  Procedures  : none  DVT Prophylaxis  :  Lovenox   Lab Results  Component Value Date   PLT 194 07/05/2016    Antibiotics  :   Anti-infectives    Start     Dose/Rate Route Frequency Ordered Stop   07/07/16 0000  oseltamivir (TAMIFLU) 30 MG capsule     30 mg Oral 2 times daily 07/07/16 0914 07/10/16 2359   07/05/16 1000  oseltamivir (TAMIFLU) 6 MG/ML suspension 75 mg  Status:  Discontinued     75 mg Oral 2 times daily 07/04/16 1825 07/04/16 2013   07/05/16 1000  oseltamivir (TAMIFLU) 6 MG/ML suspension 30 mg     30 mg Oral 2 times daily 07/04/16 2016 07/10/16 0959   07/04/16 2130  doxycycline (VIBRAMYCIN) 100 mg in dextrose 5 % 250 mL IVPB  Status:  Discontinued     100 mg 125 mL/hr over 120 Minutes Intravenous Every 12 hours 07/04/16 1825 07/06/16 1308   07/04/16 1345  cefTRIAXone (ROCEPHIN) 1 g in dextrose 5 % 50 mL IVPB     1 g 100 mL/hr over 30 Minutes Intravenous  Once 07/04/16 1335 07/04/16 1602   07/04/16 1345  azithromycin (ZITHROMAX) 500 mg in dextrose 5 % 250 mL IVPB     500 mg 250 mL/hr over 60 Minutes Intravenous  Once 07/04/16 1335 07/04/16 1723   07/04/16 1345  oseltamivir (TAMIFLU) capsule 75 mg     75 mg Oral  Once 07/04/16 1335 07/04/16 1603        Objective:   Vitals:   07/06/16 2041 07/06/16 2157 07/07/16 0617 07/07/16 0832  BP:  (!) 129/58 133/64   Pulse:  (!) 58 (!) 58   Resp:  18 18     Temp:  97.6 F (36.4 C) 97.6 F (36.4 C)   TempSrc:  Oral Oral   SpO2: 99% 100% 100% 100%  Weight:      Height:        Wt Readings from Last 3 Encounters:  07/04/16 80.3 kg (177 lb 0.5 oz)  05/22/16 79.8 kg (176 lb)  11/08/15 76.6 kg (168 lb 12.8 oz)     Intake/Output Summary (Last 24 hours) at 07/07/16 1009 Last data filed at 07/06/16 2200  Gross per 24 hour  Intake              243 ml  Output                0 ml  Net              243 ml     Physical Exam  Gen: not in distress HEENT: , moist mucosa, supple neck Chest: Scattered rhonchi bilaterally CVS: N S1&S2, no murmurs GI: soft, NT, ND,  Musculoskeletal: warm, no edema CNS: AAOx1-2,    Data Review:    CBC  Recent Labs Lab 07/04/16 1420 07/05/16 0629  WBC 8.8 5.9  HGB 14.5 12.2  HCT 44.0 38.0  PLT 214 194  MCV 92.2 91.8  MCH 30.4 29.5  MCHC 33.0 32.1  RDW 15.3 15.5  LYMPHSABS 1.5  --   MONOABS 1.4*  --   EOSABS 0.2  --   BASOSABS 0.0  --     Chemistries   Recent Labs Lab 07/04/16 1420 07/05/16 0629  NA 142 138  K 3.4* 4.5  CL 106 107  CO2 23 23  GLUCOSE 97 123*  BUN 17 14  CREATININE 1.16* 1.12*  CALCIUM 8.9 7.7*  AST 31  --   ALT 32  --   ALKPHOS 57  --   BILITOT 0.9  --    ------------------------------------------------------------------------------------------------------------------ No results for input(s): CHOL, HDL, LDLCALC, TRIG, CHOLHDL, LDLDIRECT in the last 72 hours.  Lab Results  Component Value Date   HGBA1C 5.7 (H) 09/12/2015   ------------------------------------------------------------------------------------------------------------------ No results for input(s): TSH, T4TOTAL, T3FREE, THYROIDAB in the last 72 hours.  Invalid input(s): FREET3 ------------------------------------------------------------------------------------------------------------------ No results for input(s): VITAMINB12, FOLATE, FERRITIN, TIBC, IRON, RETICCTPCT in the last 72  hours.  Coagulation profile No results for input(s): INR, PROTIME in the last 168 hours.  No results for input(s): DDIMER in the last 72 hours.  Cardiac Enzymes  Recent Labs Lab 07/04/16 2011  TROPONINI <0.03   ------------------------------------------------------------------------------------------------------------------    Component Value Date/Time   BNP 14.1 12/16/2014 1025    Inpatient Medications  Scheduled Meds: . atorvastatin  10 mg Oral q1800  . clopidogrel  75 mg Oral Daily  . dipyridamole-aspirin  1 capsule Oral BID  . feeding supplement (ENSURE ENLIVE)  237 mL Oral QID  . furosemide  20 mg Oral Daily  . guaiFENesin  20 mL Oral QID  . ipratropium-albuterol  3 mL Nebulization TID  . memantine  10 mg Oral BID  . methylPREDNISolone (SOLU-MEDROL) injection  60 mg Intravenous Q12H  . mometasone-formoterol  2 puff Inhalation BID  . oseltamivir  30 mg Oral BID  . pantoprazole  40 mg Oral q morning - 10a  . pyridOXINE  50 mg Oral q morning - 10a  . sodium chloride flush  3 mL Intravenous Q12H   Continuous Infusions:  PRN Meds:.acetaminophen **OR** acetaminophen, famotidine, ondansetron **OR** ondansetron (ZOFRAN) IV  Micro Results Recent Results (from the past 240 hour(s))  Blood Culture (routine x 2)     Status: None (Preliminary result)   Collection Time: 07/04/16  2:20 PM  Result Value Ref Range Status   Specimen Description BLOOD LEFT ANTECUBITAL  Final   Special Requests BOTTLES DRAWN AEROBIC AND ANAEROBIC 5CC  Final   Culture NO GROWTH 2 DAYS  Final   Report Status PENDING  Incomplete  Blood Culture (routine x 2)     Status: None (Preliminary result)   Collection Time: 07/04/16  2:50 PM  Result Value Ref Range Status   Specimen Description BLOOD RIGHT WRIST  Final   Special Requests BOTTLES DRAWN AEROBIC AND ANAEROBIC 5CC  Final   Culture NO GROWTH 2 DAYS  Final   Report Status PENDING  Incomplete  Urine culture     Status: None   Collection Time:  07/04/16  3:35 PM  Result Value Ref Range Status   Specimen Description URINE, CATHETERIZED  Final   Special Requests NONE  Final   Culture NO GROWTH  Final   Report Status 07/05/2016 FINAL  Final    Radiology Reports Dg Chest Port 1 View  Result Date: 07/04/2016 CLINICAL DATA:  Shortness of breath. EXAM: PORTABLE CHEST 1 VIEW COMPARISON:  Radiographs of May 05, 2016. FINDINGS: Stable cardiomediastinal silhouette. No pneumothorax or pleural effusion is noted. Stable elevation of left hemidiaphragm is noted. Right lung is clear. Minimal left basilar subsegmental atelectasis is noted. Bony thorax is unremarkable. IMPRESSION: Stable elevation left hemidiaphragm with minimal left basilar subsegmental atelectasis. No other abnormality seen in the chest. Electronically Signed   By: Marijo Conception, M.D.   On:  07/04/2016 14:02    Time Spent in minutes  25   Louellen Molder M.D on 07/07/2016 at 10:09 AM  Between 7am to 7pm - Pager - [phone]  After 7pm go to [email or web address] - password [email or web address]  [facility] -  Office  [phone]

## 2016-07-07 NOTE — Evaluation (Signed)
Occupational Therapy Evaluation Patient Details Name: Darlene Murray MRN: [id number] DOB: [birth year] Today's Date: 07/07/2016    History of Present Illness Patient is a 83 yo female admitted 07/04/16 with acute resp failure with hypoxia due to Influenza A, weakness.   PMH:  multiple CVA's (bilaterally) with residual weakness, HLD, CKD, venous stasis with dependent edema, COPD, dementia/memory deficit, CHF, legally blind Rt eye, HOH   Clinical Impression   Pt dependent with ADL PTA but able to self feed. Currently pt is at baseline; requires total assist for bathing and dressing at bed level. Max assist +2 for rolling. Pt found incontinent of bowel and bladder upon OT arrival; RN tech assisting with cleaning and repositioning. Pt planning to d/c home with 24/7 assist from her husband. No further acute OT needs identified; signing off at this time. Please re-consult if needs change. Thank you for this referral.    Follow Up Recommendations  No OT follow up;Supervision/Assistance - 24 hour    Equipment Recommendations  None recommended by OT    Recommendations for Other Services       Precautions / Restrictions Precautions Precautions: Fall Restrictions Weight Bearing Restrictions: No      Mobility Bed Mobility Overal bed mobility: Needs Assistance Bed Mobility: Rolling Rolling: Max assist;+2 for physical assistance         General bed mobility comments: Pt able to minimally assist with rolling bil directions, reaching with UEs for bed rail.    Transfers                      Balance                                            ADL Overall ADL's : Needs assistance/impaired;At baseline Eating/Feeding: Set up;Bed level                                     General ADL Comments: Pt currenlty total assist for ADL and max assist +2 for bed mobility. Pt found incontinent of urine and stool upon arrival; RN tech present to assist with  cleaning pt. SpO2 96% on RA at start of session; down to 93% on RA with bed mobility and pt with SOB. Reapplied 2L supplemental O2 via [place].     Vision     Perception     Praxis      Pertinent Vitals/Pain Pain Assessment: Faces Faces Pain Scale: Hurts little more Pain Location: bil LEs with movement Pain Descriptors / Indicators: Sore Pain Intervention(s): Limited activity within patient's tolerance;Monitored during session     Hand Dominance Right   Extremity/Trunk Assessment Upper Extremity Assessment Upper Extremity Assessment: RUE deficits/detail RUE Deficits / Details: Decreased ROM Rt shoulder   Lower Extremity Assessment Lower Extremity Assessment: Defer to PT evaluation   Cervical / Trunk Assessment Cervical / Trunk Assessment: Kyphotic   Communication Communication Communication: No difficulties;HOH (R eye )   Cognition Arousal/Alertness: Awake/alert Behavior During Therapy: Flat affect Overall Cognitive Status: History of cognitive impairments - at baseline                     General Comments       Exercises       Shoulder Instructions      Home  Living Family/patient expects to be discharged to:: Private residence Living Arrangements: Spouse/significant other Available Help at Discharge: Family;Available 24 hours/day Type of Home: House Home Access: Level entry     Home Layout: One level     Bathroom Shower/Tub: Walk-in shower;Curtain   Bathroom Toilet: Handicapped height Bathroom Accessibility: Yes   Home Equipment: Environmental consultant - 2 wheels;Cane - single point;Grab bars - tub/shower;Shower seat;Bedside commode;Wheelchair - Education administrator (comment) (Lift equipment, sounds like a Steady)          Prior Functioning/Environment Level of Independence: Needs assistance  Gait / Transfers Assistance Needed: Husband uses lift to move patient bed <> w/c <> shower chair/BSC.  Patient stands into lift and then husband rolls her to the next location -  sounds like a Stedy. ADL's / Homemaking Assistance Needed: Husband assist with bathing, dressing, toileting, meal prep, housekeepine.  Patient is able to feed herself.            OT Problem List:     OT Treatment/Interventions:      OT Goals(Current goals can be found in the care plan section) Acute Rehab OT Goals OT Goal Formulation: All assessment and education complete, DC therapy  OT Frequency:     Barriers to D/C:            Co-evaluation              End of Session Equipment Utilized During Treatment: Oxygen Nurse Communication: Mobility status Research scientist (life sciences) assisting with bed mobility/cleaning)  Activity Tolerance: Patient tolerated treatment well Patient left: in bed;with call bell/phone within reach;with bed alarm set   Time: 1034-1100 OT Time Calculation (min): 26 min Charges:  OT General Charges $OT Visit: 1 Procedure OT Evaluation $OT Eval Moderate Complexity: 1 Procedure OT Treatments $Self Care/Home Management : 8-22 mins G-Codes:     Binnie Kand M.S., OTR/L Pager: [phone]  07/07/2016, 11:16 AM

## 2016-07-08 DIAGNOSIS — N183 Chronic kidney disease, stage 3 (moderate): Secondary | ICD-10-CM

## 2016-07-08 DIAGNOSIS — F0390 Unspecified dementia without behavioral disturbance: Secondary | ICD-10-CM

## 2016-07-08 NOTE — Care Management Important Message (Signed)
Important Message  Patient Details  Name: Darlene Murray MRN: [id number] Date of Birth: [birth year]   Medicare Important Message Given:  Yes    Carles Collet, RN 07/08/2016, 12:07 PM

## 2016-07-08 NOTE — Progress Notes (Signed)
Physical Therapy Treatment Patient Details Name: Darlene Murray MRN: [id number] DOB: August 24, 1921 Today's Date: 07/08/2016    History of Present Illness Patient is a 83 yo female admitted 07/04/16 with acute resp failure with hypoxia due to Influenza A, weakness.   PMH:  multiple CVA's (bilaterally) with residual weakness, HLD, CKD, venous stasis with dependent edema, COPD, dementia/memory deficit, CHF, legally blind Rt eye, HOH    PT Comments    Pt performed increased mobility with improve activity tolerance and progressing to standing in stedy frame.  Pt with plans to d/c home with assist from husband Pt able to tolerate standing x 6 min during perianal care.  Informed nursing of patient progress.  Will continue to benefit from HHPT.     Follow Up Recommendations  Home health PT;Supervision/Assistance - 24 hour     Equipment Recommendations  None recommended by PT    Recommendations for Other Services       Precautions / Restrictions Precautions Precautions: Fall Restrictions Weight Bearing Restrictions: No    Mobility  Bed Mobility Overal bed mobility: Needs Assistance       Supine to sit: Mod assist     General bed mobility comments: Pt required assist with LEs and upper trunk to achieve sitting edge of bed.  Max assist +1 to scoot Bottom to edge of bed with chux pad.    Transfers Overall transfer level: Needs assistance Equipment used:  (used sara stedy as per PT eval reports using lift equipment at home.  ) Transfers: Sit to/from Stand Sit to Stand: Min assist         General transfer comment: Pt able to follow commands for positioning in prep for stand at stedy.  Pt able to use cross bar to pull into standing with min assist.  Good eccentric loading observed with use of UEs.  Pt will likely require increased assist with out stedy.  Educated staff to use stedy for back to bed transfer and toilet transfers.    Ambulation/Gait Ambulation/Gait assistance:  (not able.   Presents with posterior lean inj stedy able to correct with cueing.  )               Stairs            Wheelchair Mobility    Modified Rankin (Stroke Patients Only)       Balance Overall balance assessment: Needs assistance   Sitting balance-Leahy Scale: Fair       Standing balance-Leahy Scale: Poor                      Cognition Arousal/Alertness: Awake/alert Behavior During Therapy: WFL for tasks assessed/performed Overall Cognitive Status: History of cognitive impairments - at baseline                 General Comments: Pt required reorientation to situation, place and time.  Pt asked three times how long PTA had been employed during session.      Exercises      General Comments        Pertinent Vitals/Pain Pain Assessment: No/denies pain    Home Living                      Prior Function            PT Goals (current goals can now be found in the care plan section) Acute Rehab PT Goals Patient Stated Goal: None stated Potential to Achieve Goals: Good  Progress towards PT goals: Progressing toward goals    Frequency    Min 3X/week      PT Plan Current plan remains appropriate    Co-evaluation             End of Session Equipment Utilized During Treatment: Oxygen Activity Tolerance: Patient tolerated treatment well Patient left: in chair;with call bell/phone within reach;with chair alarm set     Time: [id number] PT Time Calculation (min) (ACUTE ONLY): 34 min  Charges:  $Therapeutic Activity: 23-37 mins                    G Codes:      Cristela Blue 08/06/2016, 12:49 PM  Governor Rooks, PTA pager [phone]

## 2016-07-08 NOTE — Discharge Summary (Signed)
Physician Discharge Summary  Darlene Murray [id number] DOB: 03-Apr-1922 DOA: 07/04/2016  PCP: Gennette Pac, MD  Admit date: 07/04/2016 Discharge date: 07/08/2016  Admitted From: Home Disposition:  Home  Recommendations for Outpatient Follow-up:  1. Follow up with PCP in 1-2 weeks 2. Patient will complete 5 day course of Tamiflu on 1/4.  Home Health:RN and PT Equipment/Devices: None  Discharge Condition: Stable CODE STATUS: Partial (DO NOT INTUBATE, no CPR but did okay with ACLS medications and defibrillation)  Diet recommendation: Heart healthy    Discharge Diagnoses:   Principal problem   Influenza with respiratory manifestation   Active Problems:    COPD exacerbation ([place])   Acute respiratory failure with hypoxia ([place])   Ataxia S/P CVA   CKD (chronic kidney disease), stage III   Asthma, moderate persistent   Residual cognitive deficit as late effect of stroke    Brief narrative/history of present illness 83 year old female with prior history of multiple strokes with residual weakness, hard of hearing, protein calorie malnutrition, venous insufficiency, COPD who presented to the ED with cough, wheezing and shortness of breath for 2 weeks duration . She was seen by her pulmonologist on 12/21 and started on steroid taper. Symptoms improved but again became worse past 3 days without improvement with home breathing treatment. Patient also had generalized weakness with poor by mouth intake and body aches. She denied chest pain, palpitation, abdominal pain, bowel or urinary symptoms. In the ED she was septic with fever of 102.8 Fahrenheit, tachypneic and tachycardic. Had mild acute kidney injury on labs.  Sepsis pathway initiated and admitted to hospitalist service. Flu PCR was positive for influenza.  Hospital course Principle problem Acute hypoxic respiratory failure secondary to influenza A - started on Tamiflu. . Improved with supportive care including DuoNeb,  antitussives and oral prednisone. Stable on room air and remains afebrile. Symptoms have resolved. Will complete course of Tamiflu as outpatient.  Asthma/COPD exacerbation Transitioned to oral prednisone to complete 5 more days. Weaned off oxygen. Symptoms improved  Lactic acidosis Suspected due to poor by mouth intake and underlying infection. reepat levels improved. Off fluids.  History of CHF Received fluids for hypovolemia. Now euvolemic.   Chronic kidney disease stage? 2 Stable at baseline  Dementia On [place]  Hx of CVA continue aggrenox and statin       Family Communication  : Updated husband at bedside on 12/30.  Disposition Plan  :  home with home health (as recommended by PT)   Consults  : NONE  Procedures  : none  Discharge Instructions   Allergies as of 07/08/2016   No Known Allergies     Medication List    TAKE these medications   albuterol 108 (90 Base) MCG/ACT inhaler Commonly known as:  PROVENTIL HFA;VENTOLIN HFA Inhale 2 puffs into the lungs every 6 (six) hours as needed for wheezing or shortness of breath.   atorvastatin 10 MG tablet Commonly known as:  LIPITOR Take 10 mg by mouth daily at 6 PM.   b complex vitamins tablet Take 1 tablet by mouth daily.   budesonide-formoterol 160-4.5 MCG/ACT inhaler Commonly known as:  SYMBICORT Inhale 2 puffs into the lungs 2 (two) times daily.   CALCIUM CARBONATE-VITAMIN D3 PO Take 1 tablet by mouth 2 (two) times daily.   clopidogrel 75 MG tablet Commonly known as:  PLAVIX Take 1 tablet (75 mg total) by mouth daily.   dipyridamole-aspirin 200-25 MG 12hr capsule Commonly known as:  AGGRENOX Take 1 capsule by mouth  2 (two) times daily.   DULERA 100-5 MCG/ACT Aero Generic drug:  mometasone-formoterol Inhale 2 puffs into the lungs 2 (two) times daily.   feeding supplement (ENSURE ENLIVE) Liqd Take 237 mLs by mouth 4 (four) times daily.   folic acid A999333 MCG tablet Commonly  known as:  FOLVITE Take 400 mcg by mouth daily.   furosemide 20 MG tablet Commonly known as:  LASIX Take 1 tablet (20 mg total) by mouth daily.   memantine 10 MG tablet Commonly known as:  NAMENDA Take 1 tablet (10 mg total) by mouth 2 (two) times daily.   MULTIVITAMIN/IRON PO Take 1 tablet by mouth daily.   omeprazole 20 MG tablet Commonly known as:  PRILOSEC OTC Take 20 mg by mouth every morning.   oseltamivir 30 MG capsule Commonly known as:  TAMIFLU Take 1 capsule (30 mg total) by mouth 2 (two) times daily.   PEPCID COMPLETE PO Take 1 tablet by mouth at bedtime.   PHILLIPS STOOL SOFTENER PO Take 2 capsules by mouth every evening.   polyethylene glycol powder powder Commonly known as:  GLYCOLAX/MIRALAX Take 17 g by mouth every other day.   predniSONE 20 MG tablet Commonly known as:  DELTASONE Take 2 tablet daily for 5 days, then start low dose home prednisone regimen ( 5 mg daily) What changed:  medication strength  additional instructions   predniSONE 5 MG tablet Commonly known as:  DELTASONE Take 1 tablet (5 mg total) by mouth daily with breakfast. Start taking on:  07/12/2016 What changed:  how much to take   pyridOXINE 50 MG tablet Commonly known as:  B-6 Take 50 mg by mouth every morning.   VITAMIN B-12 PO Take 1 tablet by mouth daily.      Follow-up Information    Gennette Pac, MD. Schedule an appointment as soon as possible for a visit in 1 week(s).   Specialty:  Family Medicine Contact information: [place] [address] [phone]          No Known Allergies    Procedures/Studies: Dg Chest Port 1 View  Result Date: 07/04/2016 CLINICAL DATA:  Shortness of breath. EXAM: PORTABLE CHEST 1 VIEW COMPARISON:  Radiographs of May 05, 2016. FINDINGS: Stable cardiomediastinal silhouette. No pneumothorax or pleural effusion is noted. Stable elevation of left hemidiaphragm is noted. Right lung is clear. Minimal  left basilar subsegmental atelectasis is noted. Bony thorax is unremarkable. IMPRESSION: Stable elevation left hemidiaphragm with minimal left basilar subsegmental atelectasis. No other abnormality seen in the chest. Electronically Signed   By: Marijo Conception, M.D.   On: 07/04/2016 14:02       Subjective: Feels better. No overnight events.  Discharge Exam: Vitals:   07/08/16 0514 07/08/16 1030  BP: (!) 142/67   Pulse: (!) 59 63  Resp: 18 18  Temp: 97.7 F (36.5 C)    Vitals:   07/07/16 2029 07/07/16 2300 07/08/16 0514 07/08/16 1030  BP:  136/60 (!) 142/67   Pulse:  (!) 56 (!) 59 63  Resp:  16 18 18   Temp:  98.2 F (36.8 C) 97.7 F (36.5 C)   TempSrc:  Oral Oral   SpO2: 99% 100% 96% 99%  Weight:      Height:        Gen: not in distress HEENT: , moist mucosa, supple neck Chest: Clear to auscultation bilaterally CVS: N S1&S2, no murmurs GI: soft, NT, ND,  Musculoskeletal: warm, no edema CNS: AAOx1-2,  The results of significant diagnostics from this hospitalization (including imaging, microbiology, ancillary and laboratory) are listed below for reference.     Microbiology: Recent Results (from the past 240 hour(s))  Blood Culture (routine x 2)     Status: None (Preliminary result)   Collection Time: 07/04/16  2:20 PM  Result Value Ref Range Status   Specimen Description BLOOD LEFT ANTECUBITAL  Final   Special Requests BOTTLES DRAWN AEROBIC AND ANAEROBIC 5CC  Final   Culture NO GROWTH 3 DAYS  Final   Report Status PENDING  Incomplete  Blood Culture (routine x 2)     Status: None (Preliminary result)   Collection Time: 07/04/16  2:50 PM  Result Value Ref Range Status   Specimen Description BLOOD RIGHT WRIST  Final   Special Requests BOTTLES DRAWN AEROBIC AND ANAEROBIC 5CC  Final   Culture NO GROWTH 3 DAYS  Final   Report Status PENDING  Incomplete  Urine culture     Status: None   Collection Time: 07/04/16  3:35 PM  Result Value Ref Range Status   Specimen  Description URINE, CATHETERIZED  Final   Special Requests NONE  Final   Culture NO GROWTH  Final   Report Status 07/05/2016 FINAL  Final     Labs: BNP (last 3 results) No results for input(s): BNP in the last 8760 hours. Basic Metabolic Panel:  Recent Labs Lab 07/04/16 1420 07/05/16 0629  NA 142 138  K 3.4* 4.5  CL 106 107  CO2 23 23  GLUCOSE 97 123*  BUN 17 14  CREATININE 1.16* 1.12*  CALCIUM 8.9 7.7*   Liver Function Tests:  Recent Labs Lab 07/04/16 1420  AST 31  ALT 32  ALKPHOS 57  BILITOT 0.9  PROT 7.2  ALBUMIN 3.3*   No results for input(s): LIPASE, AMYLASE in the last 168 hours. No results for input(s): AMMONIA in the last 168 hours. CBC:  Recent Labs Lab 07/04/16 1420 07/05/16 0629  WBC 8.8 5.9  NEUTROABS 5.8  --   HGB 14.5 12.2  HCT 44.0 38.0  MCV 92.2 91.8  PLT 214 194   Cardiac Enzymes:  Recent Labs Lab 07/04/16 2011  TROPONINI <0.03   BNP: Invalid input(s): POCBNP CBG:  Recent Labs Lab 07/05/16 1316  GLUCAP 142*   D-Dimer No results for input(s): DDIMER in the last 72 hours. Hgb A1c No results for input(s): HGBA1C in the last 72 hours. Lipid Profile No results for input(s): CHOL, HDL, LDLCALC, TRIG, CHOLHDL, LDLDIRECT in the last 72 hours. Thyroid function studies No results for input(s): TSH, T4TOTAL, T3FREE, THYROIDAB in the last 72 hours.  Invalid input(s): FREET3 Anemia work up No results for input(s): VITAMINB12, FOLATE, FERRITIN, TIBC, IRON, RETICCTPCT in the last 72 hours. Urinalysis    Component Value Date/Time   COLORURINE YELLOW 07/04/2016 1535   APPEARANCEUR HAZY (A) 07/04/2016 1535   LABSPEC 1.017 07/04/2016 1535   PHURINE 6.0 07/04/2016 1535   GLUCOSEU NEGATIVE 07/04/2016 1535   HGBUR NEGATIVE 07/04/2016 1535   BILIRUBINUR NEGATIVE 07/04/2016 1535   KETONESUR NEGATIVE 07/04/2016 1535   PROTEINUR NEGATIVE 07/04/2016 1535   UROBILINOGEN 1.0 04/23/2015 1106   NITRITE NEGATIVE 07/04/2016 1535    LEUKOCYTESUR NEGATIVE 07/04/2016 1535   Sepsis Labs Invalid input(s): PROCALCITONIN,  WBC,  LACTICIDVEN Microbiology Recent Results (from the past 240 hour(s))  Blood Culture (routine x 2)     Status: None (Preliminary result)   Collection Time: 07/04/16  2:20 PM  Result Value Ref Range  Status   Specimen Description BLOOD LEFT ANTECUBITAL  Final   Special Requests BOTTLES DRAWN AEROBIC AND ANAEROBIC 5CC  Final   Culture NO GROWTH 3 DAYS  Final   Report Status PENDING  Incomplete  Blood Culture (routine x 2)     Status: None (Preliminary result)   Collection Time: 07/04/16  2:50 PM  Result Value Ref Range Status   Specimen Description BLOOD RIGHT WRIST  Final   Special Requests BOTTLES DRAWN AEROBIC AND ANAEROBIC 5CC  Final   Culture NO GROWTH 3 DAYS  Final   Report Status PENDING  Incomplete  Urine culture     Status: None   Collection Time: 07/04/16  3:35 PM  Result Value Ref Range Status   Specimen Description URINE, CATHETERIZED  Final   Special Requests NONE  Final   Culture NO GROWTH  Final   Report Status 07/05/2016 FINAL  Final     Time coordinating discharge: Over 30 minutes  SIGNED:   Louellen Molder, MD  [facility] 07/08/2016, 10:45 AM Pager   If 7PM-7AM, please contact night-coverage [email or web address] Password [email or web address]

## 2016-07-08 NOTE — Progress Notes (Signed)
Darlene Murray to be D/C'd Home per MD order.  Discussed with the patient's husband and all questions fully answered.  VSS, Skin clean, dry and intact without evidence of skin break down, no evidence of skin tears noted. IV catheter discontinued intact. Site without signs and symptoms of complications. Dressing and pressure applied.  An After Visit Summary was printed and given to the patient. Patient received prescription.  D/c education completed with patient's husband including follow up instructions, medication list, d/c activities limitations if indicated, with other d/c instructions as indicated by MD - patient's husband able to verbalize understanding, all questions fully answered.   Patient D/C'd via [facility]AR. Celine Ahr 07/08/2016 7:33 PM

## 2016-07-09 LAB — CULTURE, BLOOD (ROUTINE X 2)
CULTURE: NO GROWTH
Culture: NO GROWTH

## 2016-07-10 ENCOUNTER — Other Ambulatory Visit: Payer: Self-pay | Admitting: *Deleted

## 2016-07-10 NOTE — Patient Outreach (Signed)
[place] [facility]) Care Management  07/10/2016  Haevyn Dacosta May 18, 1922 [id number]   RN outreached to pt today and spoke with a caregiver in the home who requested RN to speak directly with the pt's spouse Jenny Reichmann) however not available at this time. RN left contact name and number and requested a call back. Will await a call back if no response today will attempt to call pt on tomorrow.   [name], RN Care Management Coordinator [place] Office [phone]

## 2016-07-11 ENCOUNTER — Other Ambulatory Visit: Payer: Self-pay | Admitting: *Deleted

## 2016-07-11 ENCOUNTER — Encounter: Payer: Self-pay | Admitting: *Deleted

## 2016-07-11 DIAGNOSIS — J441 Chronic obstructive pulmonary disease with (acute) exacerbation: Secondary | ICD-10-CM

## 2016-07-11 NOTE — Patient Outreach (Signed)
[facility] [facility] Care Management  07/11/2016  Darlene Murray [birth year] [id number]  RN spoke with the [place] liaison [facility]) concerning this pt who verified upon the initial home visit with the nurse the caregiver indicated he "can not take care of his wife". Therefore the case was not open for further care and the provider was alerted of the request for placement by the pt's spouse (Dr. Eddie Dibbles office).  States Dr. Rex Kras has been out of the office but a call was returned by Sharee Pimple to verify the above information. Note pt was referred to [facility] around this time but limited on the information on the needs of this pt other then the medical history (COPD, Dementia, Kidney disease and Stroke with left side residual).   RN confirmed [facility] as not started the process for FL2 for placement.  Based upon the assistance needed for this pt RN will follow up with the pt and receive permission to began this process based upon pending order via Dr. Rex Kras and make to request for SNF placement for this pt. No additional follow up needed via [place] as pt no long eligible for [facility].  [name], RN Care Management Coordinator [place] Office [phone]

## 2016-07-11 NOTE — Patient Outreach (Addendum)
[facility] [facility]) Care Management  07/11/2016  Darlene Murray June 30, 1922 [id number]   RN spoke with [name]) today and introduced the [facility] program/services and the purpose for today's call. Although RN received this case via telephone assessment it will be a transition of care call due to caregiver reporting pt recent discharged from the hospital (1/2). Caregiver is requesting assistance with placing pt into a skilled nursing facility to build her strength. RN inquire further as caregiver explained he is having a hard time taking care of his wife due to her weakness and the care that is needed for her each day. RN research EPIC and found information that pt was to be discharged home with [facility] for RN and PT. RN inquired further on the agency and involvement. Caregiver states [place] visited with a nurse and caregiver informed the agency that he wants pt to go to a SNF for rehabilitation. [facility] was not opened at that time pending pt's options. No further appointments or services were to be continued at that time.  RN requested to follow up with [facility]'s disposition not to overlap services for seeking placement for this pt. Will follow up with pt's spouse and possible services or assistance that could be offered via [facility] services once [facility] has been confirmed. Caregiver very receptive and grateful for the assistance. Spouse also states he preference for placement is [facility] for rehabilitation for the pt.  [name], RN Care Management Coordinator [place] Office [phone]

## 2016-07-11 NOTE — Patient Outreach (Signed)
[facility]) Care Management  07/11/2016  Elysabeth Hartnell July 14, 1921 [id number]   RN spoke once again with the pt's spouse to confirm his request for placement. Pt states he only wishes to place pt temporary until pt resumes some of her strength not permanent placement. States this has happened before and the pt was able to build her strength to assist him more with her daily care. RN informed the pt concerning the limited services with [facility] due to his comment and request for SNF. RN also was granted permission to make a referral to [facility] social worker and request Dr. Eddie Dibbles assistance with and FL2 form to place the pt in an available SNF of choice (caregiver verbalize an understanding).  Caregiver fully aware that Dr. Rex Kras has to be involved with this process in order to proceed by [facility] services on placing pt into a facility for rehabilitation.   Will make a referral for post-op discharged on 1/2 for SNF based upon caregiver's request for rehabilitation. Spouse understanding at this time based upon his request for this pt that community case management pt no longer eligible based upon his request for SNF however a referral to take place and a call will be made to caregiver concerning placement of the pt if appropriate via Dr. Rex Kras. No other inquires or request at this time. Case will be closed via Environmental manager will remain involved.  [name], RN Care Management Coordinator [place] Office [phone]

## 2016-07-13 ENCOUNTER — Other Ambulatory Visit: Payer: Self-pay | Admitting: Pulmonary Disease

## 2016-07-14 ENCOUNTER — Telehealth: Payer: Self-pay | Admitting: Pulmonary Disease

## 2016-07-14 ENCOUNTER — Encounter: Payer: Self-pay | Admitting: *Deleted

## 2016-07-14 MED ORDER — PREDNISONE 5 MG PO TABS: 5.0000 mg | ORAL_TABLET | Freq: Every day | ORAL | 5 refills | Status: DC

## 2016-07-14 MED ORDER — PREDNISONE 5 MG PO TABS: 5.0000 mg | ORAL_TABLET | Freq: Every day | ORAL | 0 refills | Status: AC

## 2016-07-14 NOTE — Telephone Encounter (Signed)
Spoke with pt's spouse who is requesting refills on prednisone 5mg  daily. Prednisone was last prescribed by Nishant Dhungel on 07-12-16, but was sent as a no print.  RA please advise on refills. Thanks.

## 2016-07-14 NOTE — Telephone Encounter (Signed)
Spoke with the pt's spouse and notified rx sent and to make sure she keeps pending ov in Feb 2018  Nothing further needed

## 2016-07-14 NOTE — Telephone Encounter (Signed)
Please look at Tammy's last note with says stay at 5 mg daily Okay to send prescription Please make follow-up appointment within the next month with TP/me

## 2016-07-15 ENCOUNTER — Encounter: Payer: Self-pay | Admitting: *Deleted

## 2016-07-15 ENCOUNTER — Other Ambulatory Visit: Payer: Self-pay | Admitting: *Deleted

## 2016-07-15 NOTE — Patient Outreach (Signed)
Darlene Murray [facility]) Care Management  07/15/2016  Darlene Murray 1922/01/18 [id number]    CSW was able to make initial contact with patient's husband, Darlene Murray today to perform the phone assessment, as well as assess and assist with social work needs and services for patient.  CSW introduced self, explained role and types of services provided through [place] [facility] ([place] [facility]).  CSW further explained to Darlene Murray that CSW works with patient's RNCM, also with [place] [facility], [name]. CSW then explained the reason for the call, indicating that Mrs. Darlene Murray thought that patient and Mr. Darlene Murray would benefit from social work services and resources to assist with possible short-term placement for patient in a skilled nursing facility to receive rehabilitative services.  CSW obtained two HIPAA compliant identifiers from Darlene Murray, which included patient's name and date of birth. Darlene Murray admits that he may have declined home health services prematurely, as he is now wanting at least some form of home health physical therapy and occupational therapy for patient, so that she does not become increasingly weak and deconditioned at home.  Darlene Murray would prefer short-term placement for patient at [facility], reporting that patient has been to this skilled facility in the past for rehabilitative services and was very pleased with the care they provided.  CSW agreed to contact patient's Primary Care Physician, Darlene Murray to request a completed and signed FL-2 Form for patient.  Darlene Murray reported that patient actually has an appointment scheduled with Darlene Murray for tomorrow (Wednesday, January 10th) so Darlene Murray plans to ask Darlene Murray about obtaining the FL-2 Form, at that ime.  Once the FL-2 Form is received, CSW will make arrangements to fax the form to the admissions department at [facility] to check bed availability.   Patient was just discharged  from [facility] on January 2nd, where patient received a three day inpatient qualifying stay under Medicare; therefore, patient's Medicare benefit should pay for the first 20 days of skilled care in a nursing facility for rehabilitative services.  Darlene Murray indicated that if patient is not able to go into a skilled facility for rehabilitative services, then he definitely wants to resume home health services for patient through [place].  CSW agreed to follow-up with Darlene Murray as soon as CSW has information to report.  Darlene Murray was most appreciative of the call and any services that can be offered to patient through [place] [facility]. Darlene Murray, BSW, MSW, LCSW  Licensed Education officer, environmental [facility]  Mailing [place] [address], [place], [place] [address] Physical Address-[address], [place], [place] [address] Toll Free Main # [phone] Fax # [phone] Cell # [phone]  Fax # [phone]  [email or web address]

## 2016-07-16 DIAGNOSIS — L98421 Non-pressure chronic ulcer of back limited to breakdown of skin: Secondary | ICD-10-CM | POA: Diagnosis not present

## 2016-07-16 DIAGNOSIS — J45909 Unspecified asthma, uncomplicated: Secondary | ICD-10-CM | POA: Diagnosis not present

## 2016-07-17 ENCOUNTER — Other Ambulatory Visit: Payer: Self-pay | Admitting: *Deleted

## 2016-07-17 DIAGNOSIS — J441 Chronic obstructive pulmonary disease with (acute) exacerbation: Secondary | ICD-10-CM | POA: Diagnosis not present

## 2016-07-17 DIAGNOSIS — Z8673 Personal history of transient ischemic attack (TIA), and cerebral infarction without residual deficits: Secondary | ICD-10-CM | POA: Diagnosis not present

## 2016-07-17 DIAGNOSIS — E440 Moderate protein-calorie malnutrition: Secondary | ICD-10-CM | POA: Diagnosis not present

## 2016-07-17 DIAGNOSIS — I69993 Ataxia following unspecified cerebrovascular disease: Secondary | ICD-10-CM | POA: Diagnosis not present

## 2016-07-17 DIAGNOSIS — J4550 Severe persistent asthma, uncomplicated: Secondary | ICD-10-CM | POA: Diagnosis not present

## 2016-07-17 DIAGNOSIS — F0150 Vascular dementia without behavioral disturbance: Secondary | ICD-10-CM | POA: Diagnosis not present

## 2016-07-17 DIAGNOSIS — R531 Weakness: Secondary | ICD-10-CM | POA: Diagnosis not present

## 2016-07-17 DIAGNOSIS — R41841 Cognitive communication deficit: Secondary | ICD-10-CM | POA: Diagnosis not present

## 2016-07-17 DIAGNOSIS — N183 Chronic kidney disease, stage 3 (moderate): Secondary | ICD-10-CM | POA: Diagnosis not present

## 2016-07-17 DIAGNOSIS — K5909 Other constipation: Secondary | ICD-10-CM | POA: Diagnosis not present

## 2016-07-17 DIAGNOSIS — R1312 Dysphagia, oropharyngeal phase: Secondary | ICD-10-CM | POA: Diagnosis not present

## 2016-07-17 DIAGNOSIS — M19019 Primary osteoarthritis, unspecified shoulder: Secondary | ICD-10-CM | POA: Diagnosis not present

## 2016-07-17 DIAGNOSIS — M6281 Muscle weakness (generalized): Secondary | ICD-10-CM | POA: Diagnosis not present

## 2016-07-17 DIAGNOSIS — J1100 Influenza due to unidentified influenza virus with unspecified type of pneumonia: Secondary | ICD-10-CM | POA: Diagnosis not present

## 2016-07-17 DIAGNOSIS — R062 Wheezing: Secondary | ICD-10-CM | POA: Diagnosis not present

## 2016-07-17 NOTE — Patient Outreach (Signed)
[facility]) Care Management  07/17/2016  Lonie Lamagna 17-Apr-1922 [id number]   CSW was able to make contact with patient's husband, Farha Messman today to explain to him that patient's completed and signed FL-2 Form was submitted to [facility] for pursuit of a female bed offer.  Ivin Booty, Admissions Coordinator at [facility] immediately made a bed offer on patient and agreed to admit patient to the facility today.  Mr. Mccullick was pleasantly surprised to learn that this process only took a few days, from start to finish, and agreed to make arrangements to get patient to [facility] before 5:00pm today to complete the admissions paperwork.  Mr. Coro indicated that patient is agreeable to this plan, as she realizes that she needs rehabilitative services, prior to returning back home to live with Mr. Guardian.  CSW will continue to follow patient while at [facility] to assess and assist with possible discharge planning needs and services. [name], BSW, MSW, LCSW  Licensed Education officer, environmental Health System  Mailing [place] [address] [place], [place] [address] Physical Address-[address], [place], [place] [address] Toll Free Main # [phone] Fax # [phone] Cell # [phone]  Fax # [phone]  [email or web address]

## 2016-07-18 ENCOUNTER — Non-Acute Institutional Stay (SKILLED_NURSING_FACILITY): Payer: Medicare Other | Admitting: Internal Medicine

## 2016-07-18 ENCOUNTER — Encounter: Payer: Self-pay | Admitting: Internal Medicine

## 2016-07-18 DIAGNOSIS — F0150 Vascular dementia without behavioral disturbance: Secondary | ICD-10-CM

## 2016-07-18 DIAGNOSIS — J4550 Severe persistent asthma, uncomplicated: Secondary | ICD-10-CM

## 2016-07-18 DIAGNOSIS — M19019 Primary osteoarthritis, unspecified shoulder: Secondary | ICD-10-CM

## 2016-07-18 DIAGNOSIS — N183 Chronic kidney disease, stage 3 unspecified: Secondary | ICD-10-CM

## 2016-07-18 DIAGNOSIS — R531 Weakness: Secondary | ICD-10-CM | POA: Diagnosis not present

## 2016-07-18 DIAGNOSIS — J1100 Influenza due to unidentified influenza virus with unspecified type of pneumonia: Secondary | ICD-10-CM | POA: Diagnosis not present

## 2016-07-18 DIAGNOSIS — Z8673 Personal history of transient ischemic attack (TIA), and cerebral infarction without residual deficits: Secondary | ICD-10-CM | POA: Diagnosis not present

## 2016-07-18 DIAGNOSIS — E440 Moderate protein-calorie malnutrition: Secondary | ICD-10-CM

## 2016-07-18 DIAGNOSIS — K5909 Other constipation: Secondary | ICD-10-CM

## 2016-07-18 DIAGNOSIS — I69993 Ataxia following unspecified cerebrovascular disease: Secondary | ICD-10-CM

## 2016-07-18 NOTE — Progress Notes (Signed)
LOCATION: [place]  PCP: Gennette Pac, MD   Code Status: Full Code  Goals of care: Advanced Directive information Advanced Directives 07/15/2016  Does Patient Have a Medical Advance Directive? Yes  Type of Advance Directive Living will;Healthcare Power of Attorney  Does patient want to make changes to medical advance directive? No - Patient declined  Copy of [place] in Chart? Yes  Would patient like information on creating a medical advance directive? -       Extended Emergency Contact Information Primary Emergency Contact: Shona, Prevo Address: [address]          [place] [address] [name] of [place] Phone: [phone] Mobile Phone: [phone] Relation: Spouse Secondary Emergency Contact: [name],[name]  [place] Phone: [phone] Relation: Son   No Known Allergies  Chief Complaint  Patient presents with  . New Admit To SNF    New Admission Visit      HPI:  Patient is a 83 y.o. female seen today for short term rehabilitation post hospital admission from 07/04/2016-07/08/2016 with acute hypoxic respiratory failure secondary to influenza A pneumonia. She was started on Tamiflu and supportive care was provided with bronchodilators, antitussives and oral prednisone. She made clinical improvement and was discharged home with home health services. Given her deconditioning and increased level of care, she was brought into the facility by her family for rehabilitation. She has medical history of multiple strokes with residual weakness, protein calorie malnutrition, chronic venous insufficiency, COPD, hearing loss among others. She is seen in her room today.  Review of Systems:  Constitutional: Negative for fever, chills, diaphoresis. Feels weak and tired.  HENT: Negative for headache, congestion, nasal discharge, difficulty swallowing. She is hard of hearing.  Eyes: Negative for blurred  vision, double vision and discharge.  Respiratory: Negative for shortness of breath and wheezing.  positive for cough. Cardiovascular: Negative for chest pain, palpitations, leg swelling.  Gastrointestinal: Negative for heartburn, nausea, vomiting, abdominal pain. Doesn't remember her last bowel movement.  Genitourinary: Negative for dysuria.  Musculoskeletal: Negative for fall in the facility.  Skin: Negative for itching, rash.  Neurological: Negative for dizziness. Psychiatric/Behavioral: Negative for depression. She has memory loss  Past Medical History:  Diagnosis Date  . Acute left hemiparesis ([place])   . Allergic rhinitis   . Antibiotic-associated diarrhea   . Asthma    Moderate, persistent  . Asthmatic bronchitis   . Blindness    Legally blind, right eye  . Cough   . Fibrocystic breast disease   . HNP (herniated nucleus pulposus), lumbar    L4-L5  . Hyperlipidemia   . Left ventricular hypertrophy 2008   mild  . Physical deconditioning   . Protein calorie malnutrition ([place])   . Right sided weakness   . Right-sided lacunar infarction ([place])   . Stroke (cerebrum) ([place])   . Venous insufficiency    lower extremities   Past Surgical History:  Procedure Laterality Date  . CATARACT EXTRACTION    . COLONOSCOPY     Social History:   reports that she is a non-smoker but has been exposed to tobacco smoke. She has never used smokeless tobacco. She reports that she does not drink alcohol or use drugs.  Family History  Problem Relation Age of Onset  . Heart attack Sister   . Alzheimer's disease Sister   . Asthma Son     Medications: Allergies as of 07/18/2016   No Known Allergies  Medication List       Accurate as of 07/18/16 12:25 PM. Always use your most recent med list.          albuterol 108 (90 Base) MCG/ACT inhaler Commonly known as:  PROVENTIL HFA;VENTOLIN HFA Inhale into the lungs every 6 (six) hours as needed for wheezing or shortness of breath.     atorvastatin 10 MG tablet Commonly known as:  LIPITOR Take 10 mg by mouth daily at 6 PM.   CALCIUM CARBONATE-VITAMIN D3 PO Take 1 tablet by mouth daily.   Cholecalciferol 1000 units tablet Take 2,000 Units by mouth daily.   clopidogrel 75 MG tablet Commonly known as:  PLAVIX Take 1 tablet (75 mg total) by mouth daily.   dipyridamole-aspirin 200-25 MG 12hr capsule Commonly known as:  AGGRENOX Take 1 capsule by mouth 2 (two) times daily.   DULERA 100-5 MCG/ACT [place] Generic drug:  mometasone-formoterol Inhale 2 puffs into the lungs 2 (two) times daily.   folic acid A999333 MCG tablet Commonly known as:  FOLVITE Take 400 mcg by mouth daily.   furosemide 20 MG tablet Commonly known as:  LASIX Take 1 tablet (20 mg total) by mouth daily.   guaiFENesin 600 MG 12 hr tablet Commonly known as:  MUCINEX Take 600 mg by mouth every 12 (twelve) hours as needed (congestion).   memantine 10 MG tablet Commonly known as:  NAMENDA Take 1 tablet (10 mg total) by mouth 2 (two) times daily.   MULTIVITAMIN/IRON PO Take 1 tablet by mouth daily.   PHILLIPS STOOL SOFTENER 100 MG capsule Generic drug:  docusate sodium Take 100 mg by mouth daily as needed for mild constipation. Stop date 08/15/16   polyethylene glycol powder powder Commonly known as:  GLYCOLAX/MIRALAX Take 17 g by mouth daily as needed.   predniSONE 5 MG tablet Commonly known as:  DELTASONE Take 1 tablet (5 mg total) by mouth daily with breakfast.   pyridOXINE 50 MG tablet Commonly known as:  B-6 Take 50 mg by mouth every morning.   UNABLE TO FIND Med Name: Med pass 120 mL by mouth daily       Immunizations: Immunization History  Administered Date(s) Administered  . Influenza Split 03/20/2011, 03/31/2012, 05/07/2013, 02/27/2014  . Influenza Whole 04/06/2010  . Influenza,inj,Quad PF,36+ Mos 03/27/2016  . Influenza-Unspecified 03/08/2015  . PPD Test 09/25/2015  . Pneumococcal Conjugate-13 12/08/2013  .  Pneumococcal Polysaccharide-23 12/08/2009  . Tdap 12/08/2013     Physical Exam:  Vitals:   07/18/16 1207  BP: 127/67  Pulse: 70  Resp: 16  Temp: 97.1 F (36.2 C)  TempSrc: Oral  SpO2: 93%  Weight: 177 lb (80.3 kg)  Height: 5\' 4"  (1.626 m)   Body mass index is 30.38 kg/m.  General- elderly female, frail, chronically ill-appearing, in no acute distress Head- normocephalic, atraumatic Nose- no maxillary or frontal sinus tenderness, no nasal discharge Throat- moist mucus membrane, has dentures  Eyes- PERRLA, EOMI, no pallor, no icterus, no discharge, normal conjunctiva, normal sclera Neck- no cervical lymphadenopathy Cardiovascular- normal s1,s2, no murmur Respiratory- bilateral clear to auscultation, no wheeze, no rhonchi, no crackles, no use of accessory muscles Abdomen- bowel sounds present, soft, non tender Musculoskeletal- able to move all 4 extremities, generalized weakness more prominent on left side, limited range of motion to shoulders, trace leg edema Neurological- alert and oriented to self and place only  Skin- warm and dry Psychiatry- normal mood and affect    Labs reviewed: Basic Metabolic Panel:  Recent Labs  05/05/16 1521 07/04/16 1420 07/05/16 0629  NA 144 142 138  K 4.3 3.4* 4.5  CL 105 106 107  CO2 31 23 23   GLUCOSE 106* 97 123*  BUN 23 17 14   CREATININE 1.20 1.16* 1.12*  CALCIUM 9.5 8.9 7.7*   Liver Function Tests:  Recent Labs  09/14/15 0440  10/29/15 05/05/16 1521 07/04/16 1420  AST 18  < > 13 21 31   ALT 15  < > 10 27 32  ALKPHOS 44  < > 49 59 57  BILITOT 0.6  --   --  0.4 0.9  PROT 5.3*  --   --  6.9 7.2  ALBUMIN 2.7*  --   --  3.7 3.3*  < > = values in this interval not displayed. No results for input(s): LIPASE, AMYLASE in the last 8760 hours. No results for input(s): AMMONIA in the last 8760 hours. CBC:  Recent Labs  10/29/15 05/05/16 1521 07/04/16 1420 07/05/16 0629  WBC 6.1 9.1 8.8 5.9  NEUTROABS 3 5.4 5.8  --     HGB 12.3 13.8 14.5 12.2  HCT 38 41.5 44.0 38.0  MCV  --  91.4 92.2 91.8  PLT 277 268.0 214 194   Cardiac Enzymes:  Recent Labs  07/04/16 2011  TROPONINI <0.03   BNP: Invalid input(s): POCBNP CBG:  Recent Labs  08/02/15 0524 09/11/15 0544 07/05/16 1316  GLUCAP 80 78 142*    Radiological Exams: Dg Chest Port 1 View  Result Date: 07/04/2016 CLINICAL DATA:  Shortness of breath. EXAM: PORTABLE CHEST 1 VIEW COMPARISON:  Radiographs of May 05, 2016. FINDINGS: Stable cardiomediastinal silhouette. No pneumothorax or pleural effusion is noted. Stable elevation of left hemidiaphragm is noted. Right lung is clear. Minimal left basilar subsegmental atelectasis is noted. Bony thorax is unremarkable. IMPRESSION: Stable elevation left hemidiaphragm with minimal left basilar subsegmental atelectasis. No other abnormality seen in the chest. Electronically Signed   By: Marijo Conception, M.D.   On: 07/04/2016 14:02    Assessment/Plan  Generalized weakness From physical deconditioning. Will have her to work with physical therapy and occupational therapy to help regain her strength and balance.  Influenza pneumonia Has completed her Tamiflu. Breathing has been stable. Monitor clinically  History of CVA With left-sided weakness. Continue Aggrenox, Plavix and atorvastatin.  Ataxias status post CPE Has unsteady gait and is a high fall risk. Will need to work with physical therapy and occupational therapy to help with gait training. Fall precautions to be taken.  Vascular dementia without behavioral disturbance To provide supportive care. Has been requiring increased level of care and could be a possible long-term care at the facility. Continue Namenda 10 mg twice a day. Continue her multivitamin.  COPD/asthma Breathing stable. Continue Mucinex as needed for cough. Continue Dulera and Proventil on a needed basis. Continue prednisone 5 mg daily.  Chronic constipation Continue stool  softener and MiraLAX on a needed basis. Monitor.  Chronic kidney disease stage III Monitor BMP  Acromioclavicular joint Osteoarthritis Continue Os-Cal and vitamin D supplement. Start Tylenol 500 mg every 8 hours as needed for pain.  Protein calorie malnutrition Continue med Pass supplements. Registered dietitian to follow.   Goals of care: short term rehabilitation and possible long-term care   Labs/tests ordered: CBC, cmp 07/21/2016   Family/ staff Communication: reviewed care plan with patient and nursing supervisor    Blanchie Serve, MD Internal Medicine [facility] [address] [place], [place] [address] Cell Phone (Monday-Friday  8 am - 5 pm): [phone] On Call: [phone] and follow prompts after 5 pm and on weekends Office Phone: [phone] Office Fax: [phone]

## 2016-07-21 LAB — BASIC METABOLIC PANEL
BUN: 17 mg/dL (ref 4–21)
CREATININE: 0.9 mg/dL (ref 0.5–1.1)
Glucose: 77 mg/dL
POTASSIUM: 4.2 mmol/L (ref 3.4–5.3)
SODIUM: 144 mmol/L (ref 137–147)

## 2016-07-21 LAB — CBC AND DIFFERENTIAL
HCT: 42 % (ref 36–46)
Hemoglobin: 13.7 g/dL (ref 12.0–16.0)
Platelets: 221 10*3/uL (ref 150–399)
WBC: 10.5 10^3/mL

## 2016-07-21 LAB — HEPATIC FUNCTION PANEL
ALK PHOS: 48 U/L (ref 25–125)
ALT: 26 U/L (ref 7–35)
AST: 18 U/L (ref 13–35)
BILIRUBIN, TOTAL: 1.1 mg/dL

## 2016-07-24 ENCOUNTER — Other Ambulatory Visit: Payer: Self-pay | Admitting: *Deleted

## 2016-07-24 NOTE — Patient Outreach (Signed)
[facility]) Care Management  07/24/2016  Darlene Murray 1922-06-06 [id number]  CSW was able to make contact with patient's husband, Darlene Murray today to follow-up regarding placement arrangements for patient at the [place], where patient currently resides to receive short-term rehabilitative services.  Darlene Murray admits that patient is adjusting well to life at [facility], but that he is having to bring her meals and is trying to coax her to eat, because patient does not appear to have much of an appetite and does not like the food at the facility.  Darlene Murray went on to say that patient is working well with therapies (both physical and occupational) but that he fears that patient has finally met her maximum potential with regards to her level of strength and conditioning.   CSW and Darlene Murray spoke at length about the possibility of long-term care placement arrangements for patient in an assisted living facility.  Darlene Murray admits that he fears that he is no longer capable of providing adequate care and supervision for patient in the home.  CSW agreed to mail Darlene Murray a list of assisted living facilities, as well as a Long-Term Care Medicaid application through the [facility].  Darlene Murray indicated that he is scheduled to meet with the social worker at [facility] on Friday, January 19th at 10:00am, but that he will be calling to reschedule the meeting, due to inclement weather.  Darlene Murray agreed to notify CSW of the rescheduled meeting so that Darlene Murray can make arrangements to be in attendance. CSW was able to schedule an initial visit with patient and Darlene Murray at [facility] for Thursday, February 1st to broach the subject of long-term care placement for the first time with patient.  Darlene Murray believes the conversation with go a lot smoother if CSW is in attendance, prepared to answer any questions that patient may have at that  time.  Darlene Murray believes that patient will be receptive to assisted living placement, and she verbalizes her understanding of Darlene Murray not being able to continue to care for her in the home due to his age and failing health.  CSW was able to confirm that Darlene Murray has the correct contact information for CSW, encouraging Darlene Murray to contact CSW directly if social work services are needed in the meantime. [name], BSW, MSW, LCSW  Licensed Education officer, environmental Health System  Mailing [place] [address] [place], [place] [address] Physical Address-[address], [place], [place] [address] Toll Free Main # [phone] Fax # [phone] Cell # [phone]  Fax # [phone]  [name]

## 2016-07-28 ENCOUNTER — Ambulatory Visit: Payer: Medicare Other | Admitting: *Deleted

## 2016-07-29 ENCOUNTER — Ambulatory Visit: Payer: Medicare Other | Admitting: Podiatry

## 2016-07-31 LAB — BASIC METABOLIC PANEL
BUN: 19 mg/dL (ref 4–21)
CREATININE: 0.9 mg/dL (ref 0.5–1.1)
Glucose: 103 mg/dL
POTASSIUM: 3.7 mmol/L (ref 3.4–5.3)
Sodium: 144 mmol/L (ref 137–147)

## 2016-08-07 ENCOUNTER — Other Ambulatory Visit: Payer: Self-pay | Admitting: *Deleted

## 2016-08-07 ENCOUNTER — Ambulatory Visit: Payer: Self-pay | Admitting: *Deleted

## 2016-08-07 NOTE — Patient Outreach (Signed)
[facility]) Care Management  08/07/2016  Deisi Daloia 08/22/1921 [id number]   CSW was able to meet with patient and patient's husband, Sophiya Peavler at [facility] where patient currently resides to receive short-term rehabilitative services, today to perform a routine visit.  Patient and Mr. Calendine now admit that they are ready to receive social work assistance with completion of a Long-Term Care Medicaid application for patient, as they are strongly considering having patient reside at [facility] for a while long, or possibly even indefinitely.  CSW was able to provide Mr. Coonrod with a Long-Term Care Medicaid application, as well as assist with completion.  CSW provided Mr. Stalter with a list of information that he would need to gather to submit with the application for processing.  Mr. Vanausdal indicated that he would spend the next week gathering information and notify CSW if he has specific questions and/or concerns.  CSW agreed to assist patient and Mr. Helman with submission of the application to the [place] for processing.  CSW will follow-up with patient and Mr. Popko within the next week and a half to check the status of patient's Long-Term Care Medicaid application for submission.  Patient continues to remain slow to progress with therapies. [name], BSW, MSW, LCSW  Licensed Education officer, environmental Health System  Mailing [place] [address] [place], [place] [address] Physical Address-[address], [place], [place] [address] Toll Free Main # [phone] Fax # [phone] Cell # [phone]  Office # [phone] [email or web address]

## 2016-08-13 ENCOUNTER — Other Ambulatory Visit: Payer: Self-pay | Admitting: Pulmonary Disease

## 2016-08-14 ENCOUNTER — Ambulatory Visit: Payer: Medicare Other | Admitting: Pulmonary Disease

## 2016-08-18 ENCOUNTER — Ambulatory Visit: Payer: Medicare Other | Admitting: *Deleted

## 2016-08-18 ENCOUNTER — Other Ambulatory Visit: Payer: Self-pay | Admitting: *Deleted

## 2016-08-18 DIAGNOSIS — Z5189 Encounter for other specified aftercare: Secondary | ICD-10-CM | POA: Diagnosis not present

## 2016-08-18 DIAGNOSIS — J45909 Unspecified asthma, uncomplicated: Secondary | ICD-10-CM | POA: Diagnosis not present

## 2016-08-18 NOTE — Patient Outreach (Signed)
[facility]) Care Management  08/18/2016  Darlene Murray 15-Dec-1921 [id number]   CSW was able to meet with patient and patient's husband, [name] today to perform a routine visit.  CSW met with them at [facility] [place] where patient currently resides to receive short-term rehabilitative services.  Patient admits that she was unable to participate with therapies (both physical and occupational) last week, due to her asthma attacks.  Patient received nebulizer treatments daily and was encouraged to remain in bed and rest.  Patient reported that her breathing is not as labored and that she is not wheezing as much as she was at the beginning of last week. Patient and [name] are still awaiting the outcome of patient's Long-Term Care Medicaid application, currently pending with the [place]. CSW agreed to contact patient's Medicaid Case Worker, [name] to check the status. CSW explained to patient and [name] that CSW will be leaving to go on vacation at 5:00pm on Wednesday, February 14th, not returning until Monday, February 26th at 8:30am.  Patient and [name] voiced understanding and were encouraged to contact the [facility] office if social work assistance is needed during that time.  CSW will plan to meet with patient and [name] on Monday, February 26th, upon CSW's return to the office. [name], BSW, MSW, LCSW  Licensed Education officer, environmental Health System  Mailing [place] [address] [place], [place] [address] Physical Address-[address], [place], [place] [address] Toll Free Main # [phone] Fax # [phone] Cell # [phone]  Office # [phone] [name]

## 2016-08-27 ENCOUNTER — Ambulatory Visit: Payer: Medicare Other | Admitting: Neurology

## 2016-09-01 ENCOUNTER — Non-Acute Institutional Stay (SKILLED_NURSING_FACILITY): Payer: Medicare Other | Admitting: Adult Health

## 2016-09-01 ENCOUNTER — Encounter: Payer: Self-pay | Admitting: Adult Health

## 2016-09-01 ENCOUNTER — Encounter: Payer: Self-pay | Admitting: *Deleted

## 2016-09-01 ENCOUNTER — Other Ambulatory Visit: Payer: Self-pay | Admitting: *Deleted

## 2016-09-01 DIAGNOSIS — E785 Hyperlipidemia, unspecified: Secondary | ICD-10-CM

## 2016-09-01 DIAGNOSIS — J4550 Severe persistent asthma, uncomplicated: Secondary | ICD-10-CM | POA: Diagnosis not present

## 2016-09-01 DIAGNOSIS — F0150 Vascular dementia without behavioral disturbance: Secondary | ICD-10-CM

## 2016-09-01 DIAGNOSIS — L89152 Pressure ulcer of sacral region, stage 2: Secondary | ICD-10-CM

## 2016-09-01 DIAGNOSIS — Z8673 Personal history of transient ischemic attack (TIA), and cerebral infarction without residual deficits: Secondary | ICD-10-CM | POA: Diagnosis not present

## 2016-09-01 DIAGNOSIS — R531 Weakness: Secondary | ICD-10-CM | POA: Diagnosis not present

## 2016-09-01 NOTE — Patient Outreach (Signed)
[facility]) Care Management  09/01/2016  Darlene Murray 01-21-1922 [id number]   CSW was able to meet with patient and patient's husband, [name] today at [facility] [place] where patient currently resides to receive rehabilitative services, to perform a routine visit.  Mr. Gotsch reports that patient was approved for Long-Term Care Medicaid coverage; therefore, patient will be transitioned into a long-term care bed at [facility], once all the paperwork is finalized.  In the meantime, patient will continue to receive Recovery Therapy, so that she does not lose her strength and mobility.  Patient admits that she is unhappy with the food she receives at [facility]; therefore, Mr. Donica has committed to continuing to bring her food on a daily basis.  Mr. Gilcrest admits that he plans to visit patient daily, so bringing food to her would not be an issue.  Mr. Chismar and patient have CSW's contact information and have been encouraged to contact CSW directly if additional social work needs arise in the near future. CSW will perform a case closure on patient, as all goals of treatment have been met from social work standpoint and no additional social work needs have been identified at this time.  CSW will fax an update to patient's Primary Care Physician, Dr. Hulan Fess to ensure that they are aware of CSW's involvement with patient's plan of care.  CSW will submit a case closure request to Josepha Pigg, Care Management Assistant with [facility], in the form of an [employer].   [name], BSW, MSW, LCSW  Licensed Education officer, environmental Health System  Mailing [place] [address] [place], [place] [address] Physical Address-[address], [place], [place] [address] Toll Free Main # [phone] Fax # [phone] Cell # [phone]  Office #  [phone] [email or web address]

## 2016-09-01 NOTE — Progress Notes (Signed)
DATE:  09/01/2016   MRN:  [id number]  BIRTHDAY: 03/07/1922  Facility:  Nursing Home Location:  [place] Room Number: 208-P  LEVEL OF CARE:  SNF (31)  Contact Information    Name Relation Home Work Mobile   [place] Spouse [phone]  [phone]   [name],[name] Son [phone]         Code Status History    Date Active Date Inactive Code Status Order ID Comments User Context   07/05/2016  3:52 PM 07/08/2016  9:55 PM Partial Code [id number]  Louellen Molder, MD Inpatient   07/04/2016  6:25 PM 07/05/2016  3:52 PM Full Code [id number]  [name], MD ED   09/13/2015  4:57 PM 09/13/2015  4:57 PM Full Code [id number]  Cathlyn Parsons, PA-C Inpatient   09/13/2015  4:57 PM 09/25/2015  4:03 PM Full Code [id number]  Cathlyn Parsons, PA-C Inpatient   09/11/2015  8:52 AM 09/13/2015  4:57 PM Full Code [id number]  [name], MD ED   08/02/2015  8:13 AM 08/03/2015  5:19 PM Full Code [id number]  Samella Parr, NP Inpatient   04/23/2015  5:42 PM 04/27/2015  4:30 PM Full Code [id number]  Norval Morton, MD Inpatient   04/10/2015  2:44 PM 04/20/2015  1:25 PM Full Code [id number]  Cathlyn Parsons, PA-C Inpatient   04/06/2015  4:49 PM 04/10/2015  2:44 PM Full Code [id number]  Jonetta Osgood, MD Inpatient   04/06/2015  4:29 AM 04/06/2015  4:49 PM Full Code [id number]  Lavina Hamman, MD ED    Questions for Most Recent Historical Code Status (Order [id number])    Question Answer Comment   In the event of cardiac or respiratory ARREST: Initiate Code Blue, Call Rapid Response Yes    In the event of cardiac or respiratory ARREST: Perform CPR No    In the event of cardiac or respiratory ARREST: Perform Intubation/Mechanical Ventilation No    In the event of cardiac or respiratory ARREST: Use NIPPV/BiPAp only if indicated Yes    In the event of cardiac or respiratory ARREST: Administer ACLS medications if indicated Yes    In the event of cardiac or respiratory  ARREST: Perform Defibrillation or Cardioversion if indicated Yes        Chief Complaint  Patient presents with  . Medical Management of Chronic Issues    HISTORY OF PRESENT ILLNESS:  This is a 35-YO female seen for a routine visit.  She is a short-term rehabilitation resident of [place]. She was noted to have BLE edema so she was started on Lasix  and KCL supplementation was started.  She was treated with Prednisone taper for wheezing.    PAST MEDICAL HISTORY:  Past Medical History:  Diagnosis Date  . Acute left hemiparesis ([place])   . Allergic rhinitis   . Antibiotic-associated diarrhea   . Asthma    Moderate, persistent  . Asthmatic bronchitis   . Blindness    Legally blind, right eye  . Cough   . Fibrocystic breast disease   . HNP (herniated nucleus pulposus), lumbar    L4-L5  . Hyperlipidemia   . Left ventricular hypertrophy 2008   mild  . Physical deconditioning   . Protein calorie malnutrition ([place])   . Right sided weakness   . Right-sided lacunar infarction ([place])   . Stroke (cerebrum) ([place])   . Venous insufficiency    lower extremities  CURRENT MEDICATIONS: Reviewed  Patient's Medications  New Prescriptions   No medications on file  Previous Medications   ACETAMINOPHEN (TYLENOL) 500 MG TABLET    Take 500 mg by mouth every 8 (eight) hours as needed.   ALBUTEROL (PROVENTIL HFA;VENTOLIN HFA) 108 (90 BASE) MCG/ACT INHALER    Inhale 1 puff into the lungs every 4 (four) hours as needed for wheezing or shortness of breath.    ATORVASTATIN (LIPITOR) 10 MG TABLET    Take 10 mg by mouth daily at 6 PM.    BUDESONIDE-FORMOTEROL (SYMBICORT) 160-4.5 MCG/ACT INHALER    Inhale 2 puffs into the lungs 2 (two) times daily.   CALCIUM CARB-CHOLECALCIFEROL (CALCIUM CARBONATE-VITAMIN D3 PO)    Take 1 tablet by mouth daily.    CHOLECALCIFEROL 1000 UNITS TABLET    Take 2,000 Units by mouth daily.   CLOPIDOGREL (PLAVIX) 75 MG TABLET    Take 1 tablet (75 mg  total) by mouth daily.   DIPYRIDAMOLE-ASPIRIN (AGGRENOX) 200-25 MG 12HR CAPSULE    Take 1 capsule by mouth 2 (two) times daily.   FUROSEMIDE (LASIX) 20 MG TABLET    Take 20 mg by mouth daily as needed.   FUROSEMIDE (LASIX) 40 MG TABLET    Take 40 mg by mouth daily.   GUAIFENESIN (MUCINEX) 600 MG 12 HR TABLET    Take 600 mg by mouth every 12 (twelve) hours as needed (congestion).   MEMANTINE (NAMENDA) 10 MG TABLET    Take 1 tablet (10 mg total) by mouth 2 (two) times daily.   MULTIPLE VITAMINS-IRON (MULTIVITAMIN/IRON PO)    Take 1 tablet by mouth daily. Centrum Silver Women   MULTIPLE VITAMINS-MINERALS (DECUBI-VITE) CAPS    Take 1 capsule by mouth daily.   POLYETHYLENE GLYCOL POWDER (GLYCOLAX/MIRALAX) POWDER    Take 17 g by mouth daily as needed.    POTASSIUM CHLORIDE SA (K-DUR,KLOR-CON) 20 MEQ TABLET    Take 20 mEq by mouth daily.   PREDNISONE (DELTASONE) 5 MG TABLET    Take 1 tablet (5 mg total) by mouth daily with breakfast.   PYRIDOXINE (B-6) 100 MG TABLET    Take 100 mg by mouth daily.   UNABLE TO FIND    Med Name: Med pass 120 mL by mouth daily  Modified Medications   No medications on file  Discontinued Medications   DOCUSATE SODIUM (PHILLIPS STOOL SOFTENER) 100 MG CAPSULE    Take 100 mg by mouth daily as needed for mild constipation. Stop date 08/15/16   DULERA 100-5 MCG/ACT AERO    Inhale 2 puffs into the lungs 2 (two) times daily.   FOLIC ACID (FOLVITE) [id number] MCG TABLET    Take 400 mcg by mouth daily.   FUROSEMIDE (LASIX) 20 MG TABLET    Take 1 tablet (20 mg total) by mouth daily.   PYRIDOXINE (B-6) 50 MG TABLET    Take 50 mg by mouth every morning.      No Known Allergies   REVIEW OF SYSTEMS:    GENERAL: no change in appetite, no fatigue, no weight changes, no fever, chills or weakness EYES: Denies change in vision, dry eyes, eye pain, itching or discharge EARS: Denies change in hearing, ringing in ears, or earache NOSE: Denies nasal congestion or epistaxis MOUTH and THROAT:  Denies oral discomfort, gingival pain or bleeding, pain from teeth or hoarseness   RESPIRATORY: no cough, SOB, DOE, wheezing, hemoptysis CARDIAC: no chest pain, edema or palpitations GI: no abdominal pain, diarrhea, constipation, heart burn, nausea  or vomiting GU: Denies dysuria, frequency, hematuria, incontinence, or discharge PSYCHIATRIC: Denies feeling of depression or anxiety. No report of hallucinations, insomnia, paranoia, or agitation     PHYSICAL EXAMINATION  GENERAL APPEARANCE: Well nourished. In no acute distress. Normal body habitus SKIN:  Sacrum pressure ulcer, stage 2 HEAD: Normal in size and contour. No evidence of trauma EYES: Lids open and close normally. No blepharitis, entropion or ectropion. PERRL. Conjunctivae are clear and sclerae are white. Lenses are without opacity EARS: Pinnae are normal. Patient hears normal voice tunes of the examiner MOUTH and THROAT: Lips are without lesions. Oral mucosa is moist and without lesions. Tongue is normal in shape, size, and color and without lesions NECK: supple, trachea midline, no neck masses, no thyroid tenderness, no thyromegaly LYMPHATICS: no LAN in the neck, no supraclavicular LAN RESPIRATORY: breathing is even & unlabored, BS CTAB CARDIAC: RRR, no murmur,no extra heart sounds, BLE 2+ edema GI: abdomen soft, normal BS, no masses, no tenderness, no hepatomegaly, no splenomegaly EXTREMITIES:  Able to move X 4 extremities PSYCHIATRIC: Alert to self, confused to time and place. Affect and behavior are appropriate   LABS/RADIOLOGY: Labs reviewed: Basic Metabolic Panel:  Recent Labs  05/05/16 1521 07/04/16 1420 07/05/16 0629 07/21/16 07/31/16  NA 144 142 138 144 144  K 4.3 3.4* 4.5 4.2 3.7  CL 105 106 107  --   --   CO2 31 23 23   --   --   GLUCOSE 106* 97 123*  --   --   BUN 23 17 14 17 19   CREATININE 1.20 1.16* 1.12* 0.9 0.9  CALCIUM 9.5 8.9 7.7*  --   --    Liver Function Tests:  Recent Labs  09/14/15 0440   05/05/16 1521 07/04/16 1420 07/21/16  AST 18  < > 21 31 18   ALT 15  < > 27 32 26  ALKPHOS 44  < > 59 57 48  BILITOT 0.6  --  0.4 0.9  --   PROT 5.3*  --  6.9 7.2  --   ALBUMIN 2.7*  --  3.7 3.3*  --   < > = values in this interval not displayed. CBC:  Recent Labs  10/29/15 05/05/16 1521 07/04/16 1420 07/05/16 0629 07/21/16  WBC 6.1 9.1 8.8 5.9 10.5  NEUTROABS 3 5.4 5.8  --   --   HGB 12.3 13.8 14.5 12.2 13.7  HCT 38 41.5 44.0 38.0 42  MCV  --  91.4 92.2 91.8  --   PLT 277 268.0 214 194 221   Lipid Panel:  Recent Labs  09/12/15 0340  HDL 39*   Cardiac Enzymes:  Recent Labs  07/04/16 2011  TROPONINI <0.03   CBG:  Recent Labs  09/11/15 0544 07/05/16 1316  GLUCAP 78 142*     ASSESSMENT/PLAN:  Generalized weakness - continue rehabilitation, PT and OT, for therapeutic strengthening exercises; fall precautions  History of CVA - has left sided weakness, continue Aggrenox 25-200 mg 1 capsule by mouth twice a day, Plavix 75 mg 1 tab by mouth daily and atorvastatin 10 mg 1 tab by mouth daily  Vascular dementia without behavioral disturbance - continue supportive care; fall precautions; continue Namenda 10 mg twice a day  Asthma - no SOB; was put on prednisone taper, continue Proventil inhaler when necessary, Dulera 100 g/5 g inhaler inhale 2 puffs twice a day and prednisone 5 mg 1 tab by mouth daily  Hyperlipidemia - continue atorvastatin 10 mg 1 tab by mouth  daily  Sacral pressure ulcer, stage II - continue wound treatments daily; keep skin clean and dry     Goals of care:  Short-term rehabilitation   Ramona Slinger C. Burton - NP    [employer] [phone]

## 2016-09-08 DIAGNOSIS — R05 Cough: Secondary | ICD-10-CM | POA: Diagnosis not present

## 2016-09-08 DIAGNOSIS — R0989 Other specified symptoms and signs involving the circulatory and respiratory systems: Secondary | ICD-10-CM | POA: Diagnosis not present

## 2016-09-10 DIAGNOSIS — R4182 Altered mental status, unspecified: Secondary | ICD-10-CM | POA: Diagnosis not present

## 2016-09-19 DIAGNOSIS — I10 Essential (primary) hypertension: Secondary | ICD-10-CM | POA: Diagnosis not present

## 2016-09-23 ENCOUNTER — Non-Acute Institutional Stay (SKILLED_NURSING_FACILITY): Payer: Medicare Other | Admitting: Adult Health

## 2016-09-23 ENCOUNTER — Encounter: Payer: Self-pay | Admitting: Adult Health

## 2016-09-23 DIAGNOSIS — R600 Localized edema: Secondary | ICD-10-CM

## 2016-09-23 DIAGNOSIS — N183 Chronic kidney disease, stage 3 unspecified: Secondary | ICD-10-CM

## 2016-09-23 DIAGNOSIS — K5909 Other constipation: Secondary | ICD-10-CM

## 2016-09-23 DIAGNOSIS — J189 Pneumonia, unspecified organism: Secondary | ICD-10-CM

## 2016-09-23 DIAGNOSIS — R05 Cough: Secondary | ICD-10-CM | POA: Diagnosis not present

## 2016-09-23 DIAGNOSIS — R0989 Other specified symptoms and signs involving the circulatory and respiratory systems: Secondary | ICD-10-CM | POA: Diagnosis not present

## 2016-09-23 DIAGNOSIS — E876 Hypokalemia: Secondary | ICD-10-CM

## 2016-09-23 NOTE — Progress Notes (Signed)
Patient ID: Darlene Murray, female   DOB: 01-26-1922, 83 y.o.   MRN: [id number]    DATE:  09/23/2016   MRN:  [id number]  BIRTHDAY: 28-Sep-1921  Facility:  Nursing Home Location:  [facility] Room Number: 1002-A  LEVEL OF CARE:  SNF (31)  Contact Information    Name Relation Home Work Mobile   [name] Spouse [phone]  [phone]   Murray,Darlene Son [phone]         Code Status History    Date Active Date Inactive Code Status Order ID Comments User Context   07/05/2016  3:52 PM 07/08/2016  9:55 PM Partial Code [id number]  Louellen Molder, MD Inpatient   07/04/2016  6:25 PM 07/05/2016  3:52 PM Full Code [id number]  [name], MD ED   09/13/2015  4:57 PM 09/13/2015  4:57 PM Full Code [id number]  Cathlyn Parsons, PA-C Inpatient   09/13/2015  4:57 PM 09/25/2015  4:03 PM Full Code [id number]  Cathlyn Parsons, PA-C Inpatient   09/11/2015  8:52 AM 09/13/2015  4:57 PM Full Code [id number]  [name], MD ED   08/02/2015  8:13 AM 08/03/2015  5:19 PM Full Code [id number]  Samella Parr, NP Inpatient   04/23/2015  5:42 PM 04/27/2015  4:30 PM Full Code [id number]  Norval Morton, MD Inpatient   04/10/2015  2:44 PM 04/20/2015  1:25 PM Full Code [id number]  Cathlyn Parsons, PA-C Inpatient   04/06/2015  4:49 PM 04/10/2015  2:44 PM Full Code [id number]  Jonetta Osgood, MD Inpatient   04/06/2015  4:29 AM 04/06/2015  4:49 PM Full Code [id number]  Lavina Hamman, MD ED    Questions for Most Recent Historical Code Status (Order [id number])    Question Answer Comment   In the event of cardiac or respiratory ARREST: Initiate Code Blue, Call Rapid Response Yes    In the event of cardiac or respiratory ARREST: Perform CPR No    In the event of cardiac or respiratory ARREST: Perform Intubation/Mechanical Ventilation No    In the event of cardiac or respiratory ARREST: Use NIPPV/BiPAp only if indicated Yes    In the event of cardiac or respiratory ARREST: Administer  ACLS medications if indicated Yes    In the event of cardiac or respiratory ARREST: Perform Defibrillation or Cardioversion if indicated Yes        Chief Complaint  Patient presents with  . Medical Management of Chronic Issues    HISTORY OF PRESENT ILLNESS:  This is a 12-YO female seen for a routine visit.  She is a short-term rehabilitation resident of [place]. She was noted to have rhonchi on her left lung field such as x-ray was done. Chest x-ray revealed left basilar infiltrate. No fever has been reported. She was started on prednisone taper due to SOB and wheezing over the weekend.   PAST MEDICAL HISTORY:  Past Medical History:  Diagnosis Date  . Acute left hemiparesis ([place])   . Allergic rhinitis   . Antibiotic-associated diarrhea   . Asthma    Moderate, persistent  . Asthmatic bronchitis   . Blindness    Legally blind, right eye  . Cough   . Fibrocystic breast disease   . HNP (herniated nucleus pulposus), lumbar    L4-L5  . Hyperlipidemia   . Left ventricular hypertrophy 2008   mild  . Physical deconditioning   . Protein calorie malnutrition ([place])   .  Right sided weakness   . Right-sided lacunar infarction ([place])   . Stroke (cerebrum) ([place])   . Venous insufficiency    lower extremities     CURRENT MEDICATIONS: Reviewed  Patient's Medications  New Prescriptions   No medications on file  Previous Medications   ACETAMINOPHEN (TYLENOL) 500 MG TABLET    Take 500 mg by mouth every 8 (eight) hours as needed.   ALBUTEROL (PROVENTIL HFA;VENTOLIN HFA) 108 (90 BASE) MCG/ACT INHALER    Inhale 1 puff into the lungs every 4 (four) hours as needed for wheezing or shortness of breath.    ALBUTEROL (PROVENTIL) (2.5 MG/3ML) 0.083% NEBULIZER SOLUTION    Take 2.5 mg by nebulization 3 (three) times daily.   ATORVASTATIN (LIPITOR) 10 MG TABLET    Take 10 mg by mouth daily at 6 PM.    BUDESONIDE-FORMOTEROL (SYMBICORT) 160-4.5 MCG/ACT INHALER    Inhale 2 puffs  into the lungs 2 (two) times daily.   CALCIUM CARB-CHOLECALCIFEROL (CALCIUM CARBONATE-VITAMIN D3 PO)    Take 1 tablet by mouth daily.    CHOLECALCIFEROL 1000 UNITS TABLET    Take 2,000 Units by mouth daily.   CLOPIDOGREL (PLAVIX) 75 MG TABLET    Take 1 tablet (75 mg total) by mouth daily.   DIPYRIDAMOLE-ASPIRIN (AGGRENOX) 200-25 MG 12HR CAPSULE    Take 1 capsule by mouth 2 (two) times daily.   FUROSEMIDE (LASIX) 20 MG TABLET    Take 20 mg by mouth daily as needed.   FUROSEMIDE (LASIX) 40 MG TABLET    Take 40 mg by mouth daily.   GUAIFENESIN (MUCINEX) 600 MG 12 HR TABLET    Take 600 mg by mouth every 12 (twelve) hours as needed for cough (congestion).    IPRATROPIUM-ALBUTEROL (DUONEB) 0.5-2.5 (3) MG/3ML SOLN    Take 3 mLs by nebulization every 6 (six) hours as needed.   MEMANTINE (NAMENDA) 10 MG TABLET    Take 1 tablet (10 mg total) by mouth 2 (two) times daily.   MULTIPLE VITAMINS-MINERALS (DECUBI-VITE) CAPS    Take 1 capsule by mouth daily.   MULTIPLE VITAMINS-MINERALS (MULTIVITAMIN WITH MINERALS) TABLET    Take 1 tablet by mouth daily.   POLYETHYLENE GLYCOL POWDER (GLYCOLAX/MIRALAX) POWDER    Take 17 g by mouth daily as needed.    POTASSIUM CHLORIDE SA (K-DUR,KLOR-CON) 20 MEQ TABLET    Take 20 mEq by mouth daily.   PREDNISONE (DELTASONE) 10 MG TABLET    Take by mouth daily with breakfast. Take 30 mg po qd x3 days, 20 mg qd x3 days, 10 mg qd x3 days, then return to the routine 5 mg daily dose   PREDNISONE (DELTASONE) 5 MG TABLET    Take 1 tablet (5 mg total) by mouth daily with breakfast.   PYRIDOXINE (B-6) 100 MG TABLET    Take 100 mg by mouth daily.   UNABLE TO FIND    Med Name: Med pass 120 mL by mouth daily  Modified Medications   No medications on file  Discontinued Medications   MULTIPLE VITAMINS-IRON (MULTIVITAMIN/IRON PO)    Take 1 tablet by mouth daily. Centrum Silver Women     No Known Allergies   REVIEW OF SYSTEMS:    GENERAL: no change in appetite, no fatigue, no weight  changes, no fever, chills or weakness EYES: Denies change in vision, dry eyes, eye pain, itching or discharge EARS: Denies change in hearing, ringing in ears, or earache NOSE: Denies nasal congestion or epistaxis MOUTH and THROAT: Denies oral  discomfort, gingival pain or bleeding, pain from teeth or hoarseness   RESPIRATORY: no  SOB, DOE, hemoptysis, + wheezing CARDIAC: no chest pain, or palpitations GI: no abdominal pain, diarrhea, constipation, heart burn, nausea or vomiting GU: Denies dysuria, frequency, hematuria, incontinence, or discharge PSYCHIATRIC: Denies feeling of depression or anxiety. No report of hallucinations, insomnia, paranoia, or agitation     PHYSICAL EXAMINATION  GENERAL APPEARANCE: Well nourished. In no acute distress. Normal body habitus SKIN:  Skin is clean and dry, sacral pressure ulcer resolved HEAD: Normal in size and contour. No evidence of trauma EYES: Lids open and close normally. No blepharitis, entropion or ectropion. PERRL. Conjunctivae are clear and sclerae are white. Lenses are without opacity EARS: Pinnae are normal. Patient hears normal voice tunes of the examiner MOUTH and THROAT: Lips are without lesions. Oral mucosa is moist and without lesions. Tongue is normal in shape, size, and color and without lesions NECK: supple, trachea midline, no neck masses, no thyroid tenderness, no thyromegaly LYMPHATICS: no LAN in the neck, no supraclavicular LAN RESPIRATORY: breathing is even & unlabored, rhonchi on left lung field CARDIAC: RRR, no murmur,no extra heart sounds, BLE 2+ edema GI: abdomen soft, normal BS, no masses, no tenderness, no hepatomegaly, no splenomegaly EXTREMITIES:  Able to move X 4 extremities PSYCHIATRIC: Alert to self, confused to time and place. Affect and behavior are appropriate   LABS/RADIOLOGY: Labs reviewed: 09/19/16   sodium 144  K3.4  glucose 95 BUN 28 creatinine 1.04 calcium 9.0 GFR >24 Basic Metabolic Panel:  Recent Labs   05/05/16 1521 07/04/16 1420 07/05/16 0629 07/21/16 07/31/16  NA 144 142 138 144 144  K 4.3 3.4* 4.5 4.2 3.7  CL 105 106 107  --   --   CO2 31 23 23   --   --   GLUCOSE 106* 97 123*  --   --   BUN 23 17 14 17 19   CREATININE 1.20 1.16* 1.12* 0.9 0.9  CALCIUM 9.5 8.9 7.7*  --   --    Liver Function Tests:  Recent Labs  05/05/16 1521 07/04/16 1420 07/21/16  AST 21 31 18   ALT 27 32 26  ALKPHOS 59 57 48  BILITOT 0.4 0.9  --   PROT 6.9 7.2  --   ALBUMIN 3.7 3.3*  --    CBC:  Recent Labs  10/29/15  05/05/16 1521 07/04/16 1420 07/05/16 0629 07/21/16  WBC 6.1  < > 9.1 8.8 5.9 10.5  NEUTROABS 3  --  5.4 5.8  --   --   HGB 12.3  --  13.8 14.5 12.2 13.7  HCT 38  --  41.5 44.0 38.0 42  MCV  --   --  91.4 92.2 91.8  --   PLT 277  --  268.0 214 194 221  < > = values in this interval not displayed.   Cardiac Enzymes:  Recent Labs  07/04/16 2011  TROPONINI <0.03   CBG:  Recent Labs  07/05/16 1316  GLUCAP 142*     ASSESSMENT/PLAN:  HCAP - chest x-ray revealed left basilar infiltrate; start Levaquin 500 mg 1 tab by mouth daily 10 days and Florastor 250 mg 1 capsule by mouth twice a day I'm starting days  Bilateral lower extremity edema - improved; start Lasix 20 mg 1 tab by mouth daily 3 days then daily when necessary  Constipation - continue MiraLAX 17 g by mouth daily when necessary  Hypokalemia - K 3.4, continue KCl ER 20 meq 1  tab by mouth daily  Chronic kidney disease, stage III - creatinine 1.04, stable     Goals of care:  Short-term rehabilitation   Monina C. [place] - NP    [employer] [phone]

## 2016-09-29 DIAGNOSIS — R1311 Dysphagia, oral phase: Secondary | ICD-10-CM | POA: Diagnosis not present

## 2016-09-30 DIAGNOSIS — R1311 Dysphagia, oral phase: Secondary | ICD-10-CM | POA: Diagnosis not present

## 2016-10-01 DIAGNOSIS — R1311 Dysphagia, oral phase: Secondary | ICD-10-CM | POA: Diagnosis not present

## 2016-10-02 ENCOUNTER — Ambulatory Visit (INDEPENDENT_AMBULATORY_CARE_PROVIDER_SITE_OTHER): Payer: Medicare Other | Admitting: Neurology

## 2016-10-02 ENCOUNTER — Encounter: Payer: Self-pay | Admitting: Neurology

## 2016-10-02 VITALS — BP 106/64 | HR 80

## 2016-10-02 DIAGNOSIS — F0390 Unspecified dementia without behavioral disturbance: Secondary | ICD-10-CM | POA: Diagnosis not present

## 2016-10-02 DIAGNOSIS — F03B0 Unspecified dementia, moderate, without behavioral disturbance, psychotic disturbance, mood disturbance, and anxiety: Secondary | ICD-10-CM

## 2016-10-02 DIAGNOSIS — Z79899 Other long term (current) drug therapy: Secondary | ICD-10-CM | POA: Insufficient documentation

## 2016-10-02 DIAGNOSIS — R1311 Dysphagia, oral phase: Secondary | ICD-10-CM | POA: Diagnosis not present

## 2016-10-02 DIAGNOSIS — I69919 Unspecified symptoms and signs involving cognitive functions following unspecified cerebrovascular disease: Secondary | ICD-10-CM | POA: Diagnosis not present

## 2016-10-02 DIAGNOSIS — I639 Cerebral infarction, unspecified: Secondary | ICD-10-CM

## 2016-10-02 DIAGNOSIS — I69319 Unspecified symptoms and signs involving cognitive functions following cerebral infarction: Secondary | ICD-10-CM

## 2016-10-02 DIAGNOSIS — I6381 Other cerebral infarction due to occlusion or stenosis of small artery: Secondary | ICD-10-CM

## 2016-10-02 MED ORDER — MIRTAZAPINE 15 MG PO TABS: 15.0000 mg | ORAL_TABLET | Freq: Every day | ORAL | 11 refills | Status: AC

## 2016-10-02 MED ORDER — MIRTAZAPINE 15 MG PO TABS: 15.0000 mg | ORAL_TABLET | Freq: Every day | ORAL | 11 refills | Status: DC

## 2016-10-02 NOTE — Progress Notes (Signed)
Chief Complaint  Patient presents with  . Cerebrovascular Accident    MMSE - animals.  She is here with her husband, Darlene Murray and her son, Darlene Murray.  She is still at [facility] and [place].  She is no longer in therapy but her family feels she would benefit from restarting it.  She is also having problems with tremors in her bilateral hands.  She is having to get assistance to eat.    Chief Complaint  Patient presents with  . Cerebrovascular Accident    MMSE - animals.  She is here with her husband, Darlene Murray and her son, Darlene Murray.  She is still at [facility] and [place].  She is no longer in therapy but her family feels she would benefit from restarting it.  She is also having problems with tremors in her bilateral hands.  She is having to get assistance to eat.       PATIENT: Darlene Murray DOB: August 05, 1921  Chief Complaint  Patient presents with  . Cerebrovascular Accident    MMSE - animals.  She is here with her husband, Darlene Murray and her son, Darlene Murray.  She is still at [facility] and [place].  She is no longer in therapy but her family feels she would benefit from restarting it.  She is also having problems with tremors in her bilateral hands.  She is having to get assistance to eat.      HISTORICAL  Darlene Murray is a 83 years old right-handed female, accompanied by her husband, and her son Darlene Murray at today's visit, following up her hospital admission for stroke in April 05, 2015  She had a past medical history of hypertension, hyperlipidemia, taking low-dose prednisone 5 mg daily for asthma, born blindness of right eye, left vision is good, she was highly functional prior to her most recent stroke, driving, cooking, ambulate without assistance   She was admitted to the hospital in April 04 2015 for acute onset of nausea or vomiting unsteady gait, I have personally reviewed MRI of the brain in April 06 2015, left hippocampus hemorrhagic stroke, also moderate nonhemorrhagic stroke involving  posterior right upper and mid pons, adjacent right cerebellum, acute tiny nonhemorrhagic left thalamic infarction, the mechanism of her stroke was suspicious for embolic, she was put on aspirin 325 mg daily.  CT angiogram of head and neck showed no significant large vessel disease, intracranial atherosclerosis. Echocardiogram was normal, estimated ejection fraction 60-65%, Laboratory showed LDL 104, A1c 6.0  With rehabilitation, she has regained marked recovery, she was able to ambulate without assistance, good range of motion of her right arm,  She was readmitted to the hospital again in October for UTI, and urosepsis, upon discharge in October 21st 2016, she was noted to have profound right arm weakness, increased gait difficulty, now she is wheelchair bound, need assistant to take a few steps using bathroom,  Over the past few weeks, she again making some recovery, able to dress herself, but still has profound gait difficulty, she denies bowel and bladder incontinence. She complains of mild to moderate right shoulder achy pain, limited range of motion of her right shoulder.  UPDATE Jun 13 2015: She fell twice since last visit, her right leg gave out, she still has significant right arm and leg weakness, gait difficulty,  We have personally reviewed MRI brain showed  1. There are new subacute infarcts in the left thalamus, left pons and left cerebellum that are new compared to MRI on 04/06/15.  2. Chronic infarcts  noted the left antero-medial thalamus, right cerebellum, right pons and left hippocampus. These are stable from MRI on 04/06/15.  3. Mild scattered periventricular and subcortical chronic small vessel ischemic disease.   MRI cervical showed multi-level degenerative changes, with mild canal stenosis at C4-5, no cord signal changes, muti-level foraminal stenosis.  She was on ASA 325mg  daily  UPDATE May 2nd 2017:  She suffered another stroke since last visit in December 2016, I have  reviewed and summarized the record,  In September 11 2015, she presented with sudden onset left-sided weakness, slurred speech, I personally reviewed MRI of the brain, acute nonhemorrhagic infarction at right thalamus, posterior limb of internal capsule, tiny acute medial left occipital lobe infarction, remote bilateral cerebellar infarction, MRA of the brain and neck showed moderate to marked narrowing of the proximal right vertebral artery.  Echocardiogram with ejection fraction of 16%, grade 1 diastolic dysfunction. The patient did not receive tPA. She was discharged aspirin and Plavix went to inpatient rehabilitation, discharged home  She was still able to dress herself with minimum help, in October 19 2015, she had a sudden worsening gait difficulty, slurred speech, was taken to [facility], I reviewed CAT scan of the brain report, probable acute stroke involving right insular cortex, remote small vessel disease at bilateral cerebellum and thalamus, maxillary sinus level bilaterally, laboratory evaluation showed hemoglobin 12 point 7, creatinine of 0.93, normal liver functional test, she is now at rehabilitation place, husband planning on to bring her home soon. She is no longer ambulatory, had increased bilateral arm and leg weakness, she also complains of short-term memory trouble,right shoulder pain  UPDATE August 17th 2017: She has no recurrent strokelike event, taking Plavix, and Aggrenox, her husband and family take good care of her, she has good appetite, sleeps well, but has worsening memory loss, staying her wheelchair most of the time, need hospital lift to get up from seated position,  UPDATE October 02 2016: She is accompanied by her husband and son at today's clinical visit, she still lives at assisted living, family noticed that she has decreased appetite, worsening memory loss, she could no longer ambulate, need assistant to get up from seated position, she denied  depression, Mini-Mental Status today is 20/30, animal naming 6,   REVIEW OF SYSTEMS: Full 14 system review of systems performed and notable only for short-term memory loss, walking difficulty, joint pain,  ALLERGIES: No Known Allergies  HOME MEDICATIONS: Current Outpatient Prescriptions  Medication Sig Dispense Refill  . acetaminophen (TYLENOL) 500 MG tablet Take 500 mg by mouth every 8 (eight) hours as needed.    Marland Kitchen albuterol (PROVENTIL HFA;VENTOLIN HFA) 108 (90 Base) MCG/ACT inhaler Inhale 1 puff into the lungs every 4 (four) hours as needed for wheezing or shortness of breath.     Marland Kitchen atorvastatin (LIPITOR) 10 MG tablet Take 10 mg by mouth daily at 6 PM.     . budesonide-formoterol (SYMBICORT) 160-4.5 MCG/ACT inhaler Inhale 2 puffs into the lungs 2 (two) times daily.    . Calcium Carb-Cholecalciferol (CALCIUM CARBONATE-VITAMIN D3 PO) Take 1 tablet by mouth daily.     . Cholecalciferol 1000 units tablet Take 2,000 Units by mouth daily.    . clopidogrel (PLAVIX) 75 MG tablet Take 1 tablet (75 mg total) by mouth daily. 30 tablet 11  . dipyridamole-aspirin (AGGRENOX) 200-25 MG 12hr capsule Take 1 capsule by mouth 2 (two) times daily. 180 capsule 3  . furosemide (LASIX) 20 MG tablet Take 20 mg by mouth  daily as needed.    . furosemide (LASIX) 40 MG tablet Take 40 mg by mouth daily.    Marland Kitchen guaiFENesin (MUCINEX) 600 MG 12 hr tablet Take 600 mg by mouth every 12 (twelve) hours as needed for cough (congestion).     . memantine (NAMENDA) 10 MG tablet Take 1 tablet (10 mg total) by mouth 2 (two) times daily. 120 tablet 3  . Multiple Vitamins-Minerals (DECUBI-VITE) CAPS Take 1 capsule by mouth daily.    . Multiple Vitamins-Minerals (MULTIVITAMIN WITH MINERALS) tablet Take 1 tablet by mouth daily.    . polyethylene glycol powder (GLYCOLAX/MIRALAX) powder Take 17 g by mouth daily as needed.     . potassium chloride SA (K-DUR,KLOR-CON) 20 MEQ tablet Take 20 mEq by mouth daily.    . predniSONE (DELTASONE)  10 MG tablet Take by mouth daily with breakfast. Take 30 mg po qd x3 days, 20 mg qd x3 days, 10 mg qd x3 days, then return to the routine 5 mg daily dose    . predniSONE (DELTASONE) 5 MG tablet Take 1 tablet (5 mg total) by mouth daily with breakfast. 30 tablet 0  . pyridoxine (B-6) 100 MG tablet Take 100 mg by mouth daily.    Marland Kitchen UNABLE TO FIND Med Name: Med pass 120 mL by mouth daily     No current facility-administered medications for this visit.     PAST MEDICAL HISTORY: Past Medical History:  Diagnosis Date  . Acute left hemiparesis ([place])   . Allergic rhinitis   . Antibiotic-associated diarrhea   . Asthma    Moderate, persistent  . Asthmatic bronchitis   . Blindness    Legally blind, right eye  . Cough   . Fibrocystic breast disease   . HNP (herniated nucleus pulposus), lumbar    L4-L5  . Hyperlipidemia   . Left ventricular hypertrophy 2008   mild  . Physical deconditioning   . Protein calorie malnutrition ([place])   . Right sided weakness   . Right-sided lacunar infarction ([place])   . Stroke (cerebrum) ([place])   . Venous insufficiency    lower extremities    PAST SURGICAL HISTORY: Past Surgical History:  Procedure Laterality Date  . CATARACT EXTRACTION    . COLONOSCOPY      FAMILY HISTORY: Family History  Problem Relation Age of Onset  . Heart attack Sister   . Alzheimer's disease Sister   . Asthma Son     SOCIAL HISTORY:  Social History   Social History  . Marital status: Married    Spouse name: N/A  . Number of children: 3  . Years of education: College   Occupational History  . Retired     Pharmacist, hospital   Social History Main Topics  . Smoking status: Passive Smoke Exposure - Never Smoker  . Smokeless tobacco: Never Used     Comment: Prior exposure through husband.   . Alcohol use No  . Drug use: No  . Sexual activity: Yes    Partners: Female   Other Topics Concern  . Not on file   Social History Narrative   Lives at home with her husband.    Right-handed.   1 cup coffee per day.      [facility]:   Originally from [facility]. She has previously lived in [place]. Previously traveled to [place]. No pets currently. No prior bird exposure. Previously worked as a Education officer, museum. No mold or hot tub exposure.      PHYSICAL EXAM  Vitals:   10/02/16 1309  BP: 106/64  Pulse: 80    Not recorded      There is no height or weight on file to calculate BMI.  PHYSICAL EXAMNIATION:  Gen: NAD, conversant, well nourised, obese, well groomed                     Cardiovascular: Regular rate rhythm, no peripheral edema, warm, nontender. Eyes: Conjunctivae clear without exudates or hemorrhage Neck: Supple, no carotid bruise. Pulmonary: Clear to auscultation bilaterally   NEUROLOGICAL EXAM:  MENTAL STATUS: Speech:    Speech is normal; fluent and spontaneous with normal comprehension.  Cognition: Mini-Mental Status Examination is 20/30, animal naming 6     Orientation: She is not oriented to date, year month date, season, [place] and remote Memory: She missed 3 out of 3 recalls     Normal Attention span and concentration     Normal Language, naming, repeating,spontaneous speech, she has difficulty copy figure     Fund of knowledge   CRANIAL NERVES: CN II: Visual fields are full to confrontation. Bilateral pupil is round reactive to light CN III, IV, VI: She has limited range of motion at vertical and horizontal gaze.  CN V: Facial sensation is intact to pinprick in all 3 divisions bilaterally. Corneal responses are intact.  CN VII: Face is symmetric with normal eye closure and smile. CN VIII: Hearing is normal to rubbing fingers CN IX, X: Palate elevates symmetrically. Phonation is normal. CN XI: Head turning and shoulder shrug are intact CN XII: Tongue is midline with normal movements and no atrophy.  MOTOR: She has mild spasticity of right upper extremity, complains of right shoulder pain, limited range of motion  of right shoulder, proximal and distal bilateral upper extremity motor strength is 4, she has significant spasticity of bilateral lower extremity, is able to raise both lower extremity against gravity   REFLEXES: Reflexes are  hyperreflexia in boshe has significant rigidityth upper and lower extremity, right worse than left, plantar responses were extensor bilaterally   SENSORY: Intact to light touch, pinprick, position sense, and vibration sense are intact in fingers and toes.  COORDINATION: She has bilateral finger to nose dysmetria,  Gait: Deferred   DIAGNOSTIC DATA (LABS, IMAGING, TESTING) - I reviewed patient records, labs, notes, testing and imaging myself where available.   ASSESSMENT AND PLAN  Amila Callies is a 83 y.o. female    Stroke   Multiple stroke involving cortex, and also posterior circulation  Most likely embolic stroke,  Keep Plavix 75 mg every day and Aggrenox 1 tablet twice a day  she never have TEE and cardiac monitoring complete, in-hospital telemetry showed no dysrhythmia   Refer her to physical therapy     Mild dementia  Age-related process, central nervous system degenerative disorder, likely a vascular component.  No significant change while taking Namenda 10 mg twice a day,  On polypharmacy treatment, decreased appetite  I went over medication list with her family, will stop Namenda, start Remeron  Marcial Pacas, M.D. Ph.D.  [facility] [address] [place], [place] [address] Ph: [phone] Fax: [phone]  CC: Hulan Fess, MD

## 2016-10-03 DIAGNOSIS — R1311 Dysphagia, oral phase: Secondary | ICD-10-CM | POA: Diagnosis not present

## 2016-10-06 DIAGNOSIS — R062 Wheezing: Secondary | ICD-10-CM | POA: Diagnosis not present

## 2016-10-06 DIAGNOSIS — R1311 Dysphagia, oral phase: Secondary | ICD-10-CM | POA: Diagnosis not present

## 2016-10-07 DIAGNOSIS — Z79899 Other long term (current) drug therapy: Secondary | ICD-10-CM | POA: Diagnosis not present

## 2016-10-07 DIAGNOSIS — R1311 Dysphagia, oral phase: Secondary | ICD-10-CM | POA: Diagnosis not present

## 2016-10-07 LAB — BASIC METABOLIC PANEL
BUN: 18 mg/dL (ref 4–21)
Creatinine: 1.2 mg/dL — AB (ref 0.5–1.1)
GLUCOSE: 65 mg/dL
Potassium: 4.5 mmol/L (ref 3.4–5.3)
Sodium: 145 mmol/L (ref 137–147)

## 2016-10-07 LAB — CBC AND DIFFERENTIAL
HEMATOCRIT: 40 % (ref 36–46)
HEMOGLOBIN: 12.7 g/dL (ref 12.0–16.0)
PLATELETS: 303 10*3/uL (ref 150–399)
WBC: 9.7 10*3/mL

## 2016-10-07 NOTE — Addendum Note (Signed)
Addended by: Marcial Pacas on: 10/07/2016 03:32 PM   Modules accepted: Orders

## 2016-10-08 ENCOUNTER — Encounter (HOSPITAL_COMMUNITY): Payer: Self-pay | Admitting: *Deleted

## 2016-10-08 ENCOUNTER — Inpatient Hospital Stay (HOSPITAL_COMMUNITY): Payer: Medicare Other

## 2016-10-08 ENCOUNTER — Inpatient Hospital Stay (HOSPITAL_COMMUNITY)
Admission: AD | Admit: 2016-10-08 | Discharge: 2016-10-13 | DRG: 178 | Disposition: A | Discharge status: Skilled Nursing Facility | Payer: Medicare Other | Source: Ambulatory Visit | Attending: Internal Medicine | Admitting: Internal Medicine

## 2016-10-08 ENCOUNTER — Encounter: Payer: Self-pay | Admitting: Pulmonary Disease

## 2016-10-08 ENCOUNTER — Ambulatory Visit (INDEPENDENT_AMBULATORY_CARE_PROVIDER_SITE_OTHER): Payer: Medicare Other | Admitting: Pulmonary Disease

## 2016-10-08 DIAGNOSIS — J690 Pneumonitis due to inhalation of food and vomit: Secondary | ICD-10-CM | POA: Diagnosis not present

## 2016-10-08 DIAGNOSIS — N179 Acute kidney failure, unspecified: Secondary | ICD-10-CM | POA: Diagnosis present

## 2016-10-08 DIAGNOSIS — J4551 Severe persistent asthma with (acute) exacerbation: Secondary | ICD-10-CM | POA: Diagnosis present

## 2016-10-08 DIAGNOSIS — F0390 Unspecified dementia without behavioral disturbance: Secondary | ICD-10-CM | POA: Diagnosis present

## 2016-10-08 DIAGNOSIS — Z993 Dependence on wheelchair: Secondary | ICD-10-CM | POA: Diagnosis not present

## 2016-10-08 DIAGNOSIS — R1311 Dysphagia, oral phase: Secondary | ICD-10-CM | POA: Diagnosis not present

## 2016-10-08 DIAGNOSIS — N183 Chronic kidney disease, stage 3 unspecified: Secondary | ICD-10-CM | POA: Diagnosis present

## 2016-10-08 DIAGNOSIS — I6930 Unspecified sequelae of cerebral infarction: Secondary | ICD-10-CM | POA: Diagnosis not present

## 2016-10-08 DIAGNOSIS — J45901 Unspecified asthma with (acute) exacerbation: Secondary | ICD-10-CM | POA: Diagnosis not present

## 2016-10-08 DIAGNOSIS — Z820 Family history of epilepsy and other diseases of the nervous system: Secondary | ICD-10-CM | POA: Diagnosis not present

## 2016-10-08 DIAGNOSIS — M6281 Muscle weakness (generalized): Secondary | ICD-10-CM | POA: Diagnosis not present

## 2016-10-08 DIAGNOSIS — E440 Moderate protein-calorie malnutrition: Secondary | ICD-10-CM | POA: Diagnosis present

## 2016-10-08 DIAGNOSIS — Z79899 Other long term (current) drug therapy: Secondary | ICD-10-CM | POA: Diagnosis not present

## 2016-10-08 DIAGNOSIS — Z825 Family history of asthma and other chronic lower respiratory diseases: Secondary | ICD-10-CM

## 2016-10-08 DIAGNOSIS — J189 Pneumonia, unspecified organism: Secondary | ICD-10-CM | POA: Diagnosis not present

## 2016-10-08 DIAGNOSIS — E785 Hyperlipidemia, unspecified: Secondary | ICD-10-CM | POA: Diagnosis not present

## 2016-10-08 DIAGNOSIS — F03B0 Unspecified dementia, moderate, without behavioral disturbance, psychotic disturbance, mood disturbance, and anxiety: Secondary | ICD-10-CM | POA: Diagnosis present

## 2016-10-08 DIAGNOSIS — Z8673 Personal history of transient ischemic attack (TIA), and cerebral infarction without residual deficits: Secondary | ICD-10-CM

## 2016-10-08 DIAGNOSIS — J9600 Acute respiratory failure, unspecified whether with hypoxia or hypercapnia: Secondary | ICD-10-CM | POA: Diagnosis not present

## 2016-10-08 DIAGNOSIS — D72829 Elevated white blood cell count, unspecified: Secondary | ICD-10-CM | POA: Diagnosis not present

## 2016-10-08 DIAGNOSIS — R1312 Dysphagia, oropharyngeal phase: Secondary | ICD-10-CM | POA: Diagnosis not present

## 2016-10-08 DIAGNOSIS — R05 Cough: Secondary | ICD-10-CM | POA: Diagnosis not present

## 2016-10-08 LAB — CBC WITH DIFFERENTIAL/PLATELET
BASOS ABS: 0 10*3/uL (ref 0.0–0.1)
BASOS PCT: 0 %
Eosinophils Absolute: 1.4 10*3/uL — ABNORMAL HIGH (ref 0.0–0.7)
Eosinophils Relative: 14 %
HEMATOCRIT: 39.6 % (ref 36.0–46.0)
Hemoglobin: 13.1 g/dL (ref 12.0–15.0)
Lymphocytes Relative: 23 %
Lymphs Abs: 2.3 10*3/uL (ref 0.7–4.0)
MCH: 29 pg (ref 26.0–34.0)
MCHC: 33.1 g/dL (ref 30.0–36.0)
MCV: 87.8 fL (ref 78.0–100.0)
MONO ABS: 0.9 10*3/uL (ref 0.1–1.0)
Monocytes Relative: 9 %
NEUTROS ABS: 5.4 10*3/uL (ref 1.7–7.7)
Neutrophils Relative %: 54 %
PLATELETS: 313 10*3/uL (ref 150–400)
RBC: 4.51 MIL/uL (ref 3.87–5.11)
RDW: 15.2 % (ref 11.5–15.5)
WBC: 10 10*3/uL (ref 4.0–10.5)

## 2016-10-08 LAB — COMPREHENSIVE METABOLIC PANEL
ALT: 23 U/L (ref 14–54)
ANION GAP: 10 (ref 5–15)
AST: 31 U/L (ref 15–41)
Albumin: 3.3 g/dL — ABNORMAL LOW (ref 3.5–5.0)
Alkaline Phosphatase: 67 U/L (ref 38–126)
BUN: 19 mg/dL (ref 6–20)
CHLORIDE: 106 mmol/L (ref 101–111)
CO2: 25 mmol/L (ref 22–32)
CREATININE: 1.29 mg/dL — AB (ref 0.44–1.00)
Calcium: 9.1 mg/dL (ref 8.9–10.3)
GFR, EST AFRICAN AMERICAN: 40 mL/min — AB (ref >60)
GFR, EST NON AFRICAN AMERICAN: 35 mL/min — AB (ref >60)
Glucose, Bld: 88 mg/dL (ref 65–99)
POTASSIUM: 4.3 mmol/L (ref 3.5–5.1)
Sodium: 141 mmol/L (ref 135–145)
Total Bilirubin: 0.9 mg/dL (ref 0.3–1.2)
Total Protein: 7.1 g/dL (ref 6.5–8.1)

## 2016-10-08 LAB — TROPONIN I

## 2016-10-08 LAB — MAGNESIUM: MAGNESIUM: 1.8 mg/dL (ref 1.7–2.4)

## 2016-10-08 MED ORDER — BUDESONIDE 0.5 MG/2ML IN SUSP
0.5000 mg | Freq: Two times a day (BID) | RESPIRATORY_TRACT | Status: DC
  Administered 2016-10-08 – 2016-10-13 (×10): 0.5 mg via RESPIRATORY_TRACT
  Filled 2016-10-08 (×10): qty 2

## 2016-10-08 MED ORDER — VANCOMYCIN HCL 10 G IV SOLR
1250.0000 mg | Freq: Once | INTRAVENOUS | Status: AC
  Administered 2016-10-08: 1250 mg via INTRAVENOUS
  Filled 2016-10-08: qty 1250

## 2016-10-08 MED ORDER — PIPERACILLIN-TAZOBACTAM 3.375 G IVPB
3.3750 g | Freq: Once | INTRAVENOUS | Status: AC
  Administered 2016-10-08: 3.375 g via INTRAVENOUS
  Filled 2016-10-08: qty 50

## 2016-10-08 MED ORDER — ASPIRIN-DIPYRIDAMOLE ER 25-200 MG PO CP12
1.0000 | ORAL_CAPSULE | Freq: Two times a day (BID) | ORAL | Status: DC
  Administered 2016-10-09 – 2016-10-13 (×8): 1 via ORAL
  Filled 2016-10-08 (×9): qty 1

## 2016-10-08 MED ORDER — ACETAMINOPHEN 325 MG PO TABS: 650.0000 mg | ORAL_TABLET | ORAL | Status: DC | PRN

## 2016-10-08 MED ORDER — ENOXAPARIN SODIUM 30 MG/0.3ML ~~LOC~~ SOLN
30.0000 mg | SUBCUTANEOUS | Status: DC
  Administered 2016-10-08 – 2016-10-12 (×6): 30 mg via SUBCUTANEOUS
  Filled 2016-10-08 (×5): qty 0.3

## 2016-10-08 MED ORDER — CLOPIDOGREL BISULFATE 75 MG PO TABS
75.0000 mg | ORAL_TABLET | Freq: Every day | ORAL | Status: DC
  Administered 2016-10-08 – 2016-10-13 (×6): 75 mg via ORAL
  Filled 2016-10-08 (×6): qty 1

## 2016-10-08 MED ORDER — ATORVASTATIN CALCIUM 10 MG PO TABS
10.0000 mg | ORAL_TABLET | Freq: Every day | ORAL | Status: DC
  Administered 2016-10-08 – 2016-10-12 (×5): 10 mg via ORAL
  Filled 2016-10-08 (×5): qty 1

## 2016-10-08 MED ORDER — VANCOMYCIN HCL IN DEXTROSE 750-5 MG/150ML-% IV SOLN: 750.0000 mg | INTRAVENOUS | Status: DC

## 2016-10-08 MED ORDER — IPRATROPIUM-ALBUTEROL 0.5-2.5 (3) MG/3ML IN SOLN
3.0000 mL | Freq: Four times a day (QID) | RESPIRATORY_TRACT | Status: DC
  Administered 2016-10-08 – 2016-10-11 (×9): 3 mL via RESPIRATORY_TRACT
  Filled 2016-10-08 (×11): qty 3

## 2016-10-08 MED ORDER — METHYLPREDNISOLONE SODIUM SUCC 40 MG IJ SOLR
40.0000 mg | Freq: Four times a day (QID) | INTRAMUSCULAR | Status: DC
  Administered 2016-10-08 – 2016-10-11 (×11): 40 mg via INTRAVENOUS
  Filled 2016-10-08 (×11): qty 1

## 2016-10-08 MED ORDER — PIPERACILLIN-TAZOBACTAM 3.375 G IVPB
3.3750 g | Freq: Three times a day (TID) | INTRAVENOUS | Status: DC
  Administered 2016-10-09 (×2): 3.375 g via INTRAVENOUS
  Filled 2016-10-08 (×3): qty 50

## 2016-10-08 MED ORDER — MEMANTINE HCL 10 MG PO TABS
10.0000 mg | ORAL_TABLET | Freq: Two times a day (BID) | ORAL | Status: DC
  Administered 2016-10-08 – 2016-10-09 (×2): 10 mg via ORAL
  Filled 2016-10-08 (×2): qty 1

## 2016-10-08 MED ORDER — LEVOFLOXACIN IN D5W 500 MG/100ML IV SOLN
500.0000 mg | INTRAVENOUS | Status: DC
  Filled 2016-10-08: qty 100

## 2016-10-08 MED ORDER — ENSURE ENLIVE PO LIQD
237.0000 mL | Freq: Two times a day (BID) | ORAL | Status: DC
  Administered 2016-10-09 – 2016-10-13 (×4): 237 mL via ORAL

## 2016-10-08 MED ORDER — ARFORMOTEROL TARTRATE 15 MCG/2ML IN NEBU
15.0000 ug | INHALATION_SOLUTION | Freq: Two times a day (BID) | RESPIRATORY_TRACT | Status: DC
  Administered 2016-10-08 – 2016-10-13 (×10): 15 ug via RESPIRATORY_TRACT
  Filled 2016-10-08 (×10): qty 2

## 2016-10-08 NOTE — Progress Notes (Signed)
Pharmacy Antibiotic Note  Darlene Murray is a 83 y.o. female admitted on 10/08/2016 with HCAP.  Pharmacy has been consulted for vancomycin/Zosyn dosing.  Pt was admitted with severe persistent asthma with exacerbation. Pt has also been started on  IV Solu-Medrol 40 mg and DuoNeb nebs Pt also with suspect aspiration pneumonia- which is initially being treated as hCAP.   Plan:  Zosyn 3.375g IV q8h (4 hour infusion).   Vancomycin 1250 mg IV x1, then vancomycin 750 mg IV q24h  Monitor clinical course, renal function, cultures as available  BMP ordered with AM labs  Height: 5\' 4"  (162.6 cm) Weight: 159 lb 9.8 oz (72.4 kg) IBW/kg (Calculated) : 54.7  Temp (24hrs), Avg:98 F (36.7 C), Min:97.5 F (36.4 C), Max:98.5 F (36.9 C)   Recent Labs Lab 10/08/16 1908  WBC 10.0  CREATININE 1.29*    Estimated Creatinine Clearance: 27.1 mL/min (A) (by C-G formula based on SCr of 1.29 mg/dL (H)).    No Known Allergies  Antimicrobials this admission:  4/4 vancomycin >>  4/4 Zosyn >>   Dose adjustments this admission: ---  Microbiology results:   Thank you for allowing pharmacy to be a part of this patient's care.   Royetta Asal, PharmD, BCPS Pager [phone] 10/08/2016 8:23 PM

## 2016-10-08 NOTE — H&P (Signed)
Name: Darlene Murray MRN: [id number] DOB: 1922/01/28    ADMISSION DATE:  10/08/2016  CHIEF COMPLAINT:  increased wheezing    HISTORY OF PRESENT ILLNESS:  83 year old woman with severe persistent asthma maintenance Symbicort was brought in to the office today as an acute visit by her husband and son. Unfortunately she has suffered several strokes and is now in a nursing home. She is wheelchair-bound and dependent for all activities of daily living. Apparently she had a swallow study and was cleared for a full diet. She has been having increasing wheezing for many months as per her family members. She is prednisone dependent and on her last visit with me 6 months ago she was on about 5-10 mg of prednisone. They're not clear on whether she can take Symbicort and albuterol MDI with a spacer. They report cough but she is really not able to expectorate. Chest x-ray from [facility] on 4/2 showed bibasal pneumonia and a small left effusion  No fevers or change in mental status or burning micturition  PAST MEDICAL HISTORY :   has a past medical history of Acute left hemiparesis ([place]); Allergic rhinitis; Antibiotic-associated diarrhea; Asthma; Asthmatic bronchitis; Blindness; Cough; Fibrocystic breast disease; HNP (herniated nucleus pulposus), lumbar; Hyperlipidemia; Left ventricular hypertrophy (2008); Physical deconditioning; Protein calorie malnutrition ([place]); Right sided weakness; Right-sided lacunar infarction [facility]); Stroke (cerebrum) ([place]); and Venous insufficiency.  has a past surgical history that includes Cataract extraction and Colonoscopy. Prior to Admission medications   Medication Sig Start Date End Date Taking? Authorizing Provider  acetaminophen (TYLENOL) 500 MG tablet Take 500 mg by mouth every 8 (eight) hours as needed.    Historical Provider, MD  albuterol (PROVENTIL HFA;VENTOLIN HFA) 108 (90 Base) MCG/ACT inhaler Inhale 1 puff into the lungs every 4 (four) hours as needed for wheezing or  shortness of breath.     Historical Provider, MD  atorvastatin (LIPITOR) 10 MG tablet Take 10 mg by mouth daily at 6 PM.     Historical Provider, MD  budesonide-formoterol (SYMBICORT) 160-4.5 MCG/ACT inhaler Inhale 2 puffs into the lungs 2 (two) times daily.    Historical Provider, MD  Calcium Carb-Cholecalciferol (CALCIUM CARBONATE-VITAMIN D3 PO) Take 1 tablet by mouth daily.     Historical Provider, MD  Cholecalciferol 1000 units tablet Take 2,000 Units by mouth daily.    Historical Provider, MD  clopidogrel (PLAVIX) 75 MG tablet Take 1 tablet (75 mg total) by mouth daily. 06/13/15   Marcial Pacas, MD  dipyridamole-aspirin (AGGRENOX) 200-25 MG 12hr capsule Take 1 capsule by mouth 2 (two) times daily. 12/27/15   Marcial Pacas, MD  furosemide (LASIX) 20 MG tablet Take 20 mg by mouth daily as needed.    Historical Provider, MD  furosemide (LASIX) 40 MG tablet Take 40 mg by mouth daily.    Historical Provider, MD  guaiFENesin (MUCINEX) 600 MG 12 hr tablet Take 600 mg by mouth every 12 (twelve) hours as needed for cough (congestion).     Historical Provider, MD  memantine (NAMENDA) 10 MG tablet Take 1 tablet (10 mg total) by mouth 2 (two) times daily. 04/24/16   Marcial Pacas, MD  mirtazapine (REMERON) 15 MG tablet Take 1 tablet (15 mg total) by mouth at bedtime. 10/02/16   Marcial Pacas, MD  Multiple Vitamins-Minerals (DECUBI-VITE) CAPS Take 1 capsule by mouth daily.    Historical Provider, MD  Multiple Vitamins-Minerals (MULTIVITAMIN WITH MINERALS) tablet Take 1 tablet by mouth daily.    Historical Provider, MD  polyethylene glycol powder (GLYCOLAX/MIRALAX)  powder Take 17 g by mouth daily as needed.     Historical Provider, MD  potassium chloride SA (K-DUR,KLOR-CON) 20 MEQ tablet Take 20 mEq by mouth daily.    Historical Provider, MD  predniSONE (DELTASONE) 10 MG tablet Take by mouth daily with breakfast. Take 30 mg po qd x3 days, 20 mg qd x3 days, 10 mg qd x3 days, then return to the routine 5 mg daily dose 09/23/16    Historical Provider, MD  predniSONE (DELTASONE) 5 MG tablet Take 1 tablet (5 mg total) by mouth daily with breakfast. 07/14/16   Rigoberto Noel, MD  pyridoxine (B-6) 100 MG tablet Take 100 mg by mouth daily.    Historical Provider, MD  UNABLE TO FIND Med Name: Med pass 120 mL by mouth daily    Historical Provider, MD   No Known Allergies  FAMILY HISTORY:  family history includes Alzheimer's disease in her sister; Asthma in her son; Heart attack in her sister. SOCIAL HISTORY:  reports that she is a non-smoker but has been exposed to tobacco smoke. She has never used smokeless tobacco. She reports that she does not drink alcohol or use drugs.  REVIEW OF SYSTEMS:    Unable to obtain in detail from the patient Constitutional: Negative for fever, chills, weight loss, malaise/fatigue and diaphoresis.  HENT: Negative for hearing loss, ear pain, nosebleeds, congestion, sore throat, neck pain, tinnitus and ear discharge.   Eyes: Negative for blurred vision, double vision, photophobia, pain, discharge and redness.  Respiratory: Negative for , hemoptysis, sputum production,  and stridor.   Cardiovascular: Negative for chest pain, palpitations, orthopnea, claudication, leg swelling and PND.  Gastrointestinal: Negative for heartburn, nausea, vomiting, abdominal pain, diarrhea, constipation, blood in stool and melena.  Genitourinary: Negative for dysuria, urgency, frequency, hematuria and flank pain.  Musculoskeletal: Negative for myalgias, back pain, joint pain and falls.  Skin: Negative for itching and rash.  Neurological: Negative for dizziness, tingling, tremors, sensory change, speech change, focal weakness, seizures, loss of consciousness, weakness and headaches.    SUBJECTIVE:   VITAL SIGNS: Pulse Rate:  [95] 95 (04/04 1516) BP: (116)/(72) 116/72 (04/04 1516) SpO2:  [95 %] 95 % (04/04 1516)  PHYSICAL EXAMINATION: Gen. Pleasant, well-nourished, in mild distress, normal affect ENT - no  lesions, no post nasal drip Neck: No JVD, no thyromegaly, no carotid bruits Lungs: no use of accessory muscles, no dullness to percussion, audible upper airway rhonchi , transmitted to lower airways Cardiovascular: Rhythm regular, heart sounds  normal, no murmurs or gallops, no peripheral edema Abdomen: soft and non-tender, no hepatosplenomegaly, BS normal. Musculoskeletal: No deformities, no cyanosis or clubbing Neuro:  alert, awake, left-sided weakness, in wheelchair   No results for input(s): NA, K, CL, CO2, BUN, CREATININE, GLUCOSE in the last 168 hours. No results for input(s): HGB, HCT, WBC, PLT in the last 168 hours. No results found.  ASSESSMENT / PLAN:  Severe persistent asthma with exacerbation- given respiratory distress will admit to the hospital and treated with IV Solu-Medrol 40 every 6 hours and DuoNeb nebs I really question her ability to use MDI and therefore we'll keep her on Pulmicort and Brovana on discharge  Suspect aspiration pneumonia- we'll treat her initially as hCAP with Zosyn and vancomycin  Obtain swallow evaluation and simplify antibiotics as she improves  Old stroke-Aggrenox and Plavix will be continued  Plan was discussed with her husband and son in detail, she was directly admitted from the office to the hospital  Kara Mead MD.  FCCP. [facility] Pager [phone] If no response call [phone]    10/08/2016, 5:32 PM

## 2016-10-08 NOTE — Progress Notes (Signed)
   Subjective:    Patient ID: Darlene Murray, female    DOB: 26-Mar-1922, 83 y.o.   MRN: [id number]  HPI  83 year old with severe persistent asthma, frequent exacerbations, steroid dependence.  Unfortunately she had several strokes and is now wheelchair bound needing assistance for all activities.  10/08/2016  Chief Complaint  Patient presents with  . Acute Visit    Increased wheezing. Productive cough with yellow and green phlegm.      She is accompanied by her husband who is her primary caregiver.   Husband reports mild increase in wheezing, with occasional nocturnal symptoms She is Maintained on 5 mg of prednisone She is compliant with Dulera through a spacer  She has no swallowing issues and no cough after eating. She does have some degree of hypersomnolence. Legs are chronically swollen but seems to have increased pedal edema   Review of Systems Patient denies significant dyspnea,cough, hemoptysis,  chest pain, palpitations, pedal edema, orthopnea, paroxysmal nocturnal dyspnea, lightheadedness, nausea, vomiting, abdominal or  leg pains      Objective:   Physical Exam   Gen. Pleasant, well-nourished, in no distress, normal affect ENT - no lesions, no post nasal drip Neck: No JVD, no thyromegaly, no carotid bruits Lungs: no use of accessory muscles, no dullness to percussion, BL diffuse rhonchi rhonchi  Cardiovascular: Rhythm regular, heart sounds  normal, no murmurs or gallops, no peripheral edema Abdomen: soft and non-tender, no hepatosplenomegaly, BS normal. Musculoskeletal: No deformities, no cyanosis or clubbing Neuro:  alert, non focal        Assessment & Plan:

## 2016-10-08 NOTE — Assessment & Plan Note (Signed)
Admit to [facility]. See H&P for further details plan

## 2016-10-09 DIAGNOSIS — L8990 Pressure ulcer of unspecified site, unspecified stage: Secondary | ICD-10-CM | POA: Insufficient documentation

## 2016-10-09 DIAGNOSIS — N179 Acute kidney failure, unspecified: Secondary | ICD-10-CM

## 2016-10-09 LAB — CBC
HCT: 37.1 % (ref 36.0–46.0)
Hemoglobin: 12.1 g/dL (ref 12.0–15.0)
MCH: 28.5 pg (ref 26.0–34.0)
MCHC: 32.6 g/dL (ref 30.0–36.0)
MCV: 87.3 fL (ref 78.0–100.0)
PLATELETS: 312 10*3/uL (ref 150–400)
RBC: 4.25 MIL/uL (ref 3.87–5.11)
RDW: 15.3 % (ref 11.5–15.5)
WBC: 7.2 10*3/uL (ref 4.0–10.5)

## 2016-10-09 LAB — BASIC METABOLIC PANEL
ANION GAP: 10 (ref 5–15)
BUN: 22 mg/dL — AB (ref 6–20)
CO2: 22 mmol/L (ref 22–32)
Calcium: 8.6 mg/dL — ABNORMAL LOW (ref 8.9–10.3)
Chloride: 109 mmol/L (ref 101–111)
Creatinine, Ser: 1.4 mg/dL — ABNORMAL HIGH (ref 0.44–1.00)
GFR calc Af Amer: 37 mL/min — ABNORMAL LOW (ref >60)
GFR calc non Af Amer: 32 mL/min — ABNORMAL LOW (ref >60)
GLUCOSE: 96 mg/dL (ref 65–99)
Potassium: 4.9 mmol/L (ref 3.5–5.1)
Sodium: 141 mmol/L (ref 135–145)

## 2016-10-09 MED ORDER — ALBUTEROL SULFATE (2.5 MG/3ML) 0.083% IN NEBU
2.5000 mg | INHALATION_SOLUTION | RESPIRATORY_TRACT | Status: DC | PRN
  Administered 2016-10-09: 2.5 mg via RESPIRATORY_TRACT
  Filled 2016-10-09: qty 3

## 2016-10-09 MED ORDER — FUROSEMIDE 10 MG/ML IJ SOLN
40.0000 mg | Freq: Every day | INTRAMUSCULAR | Status: DC
  Administered 2016-10-09 – 2016-10-13 (×5): 40 mg via INTRAVENOUS
  Filled 2016-10-09 (×5): qty 4

## 2016-10-09 MED ORDER — MIRTAZAPINE 15 MG PO TABS
15.0000 mg | ORAL_TABLET | Freq: Every day | ORAL | Status: DC
  Administered 2016-10-10 – 2016-10-12 (×3): 15 mg via ORAL
  Filled 2016-10-09 (×3): qty 1

## 2016-10-09 MED ORDER — SODIUM CHLORIDE 0.9 % IV SOLN
1.5000 g | Freq: Two times a day (BID) | INTRAVENOUS | Status: DC
  Administered 2016-10-09 – 2016-10-11 (×5): 1.5 g via INTRAVENOUS
  Administered 2016-10-12: 0.03 g via INTRAVENOUS
  Administered 2016-10-12 – 2016-10-13 (×2): 1.5 g via INTRAVENOUS
  Filled 2016-10-09 (×9): qty 1.5

## 2016-10-09 NOTE — Progress Notes (Signed)
Name: Darlene Murray MRN: [id number] DOB: 11-05-1921    ADMISSION DATE:  10/08/2016  CHIEF COMPLAINT:  increased wheezing    HISTORY OF PRESENT ILLNESS:  83 year old woman with severe persistent asthma (maintenance Symbicort) was brought in to the office today as an acute visit by her husband and son. Unfortunately she has suffered several strokes and is now in a nursing home. She is wheelchair-bound and dependent for all activities of daily living. Apparently she had a swallow study and was cleared for a full diet. She has been having increasing wheezing for many months as per her family members. She is prednisone dependent and on her last visit with me 6 months ago she was on about 5-10 mg of prednisone. They're not clear on whether she can take Symbicort and albuterol MDI with a spacer. They report cough but she is really not able to expectorate. Chest x-ray from [facility] on 4/2 showed bibasal pneumonia and a small left effusion.  No fevers or change in mental status or burning micturition  SUBJECTIVE:  RN reports pt sleeping intermittently, wakes and states "my nose is itching".  Staff report wheezing on exam  VITAL SIGNS: Temp:  [97.5 F (36.4 C)-98.5 F (36.9 C)] 98.5 F (36.9 C) (04/05 0626) Pulse Rate:  [72-95] 93 (04/05 0626) Resp:  [20-24] 20 (04/05 0626) BP: (112-120)/(52-72) 113/58 (04/05 0626) SpO2:  [94 %-97 %] 95 % (04/05 0840) Weight:  [159 lb 9.8 oz (72.4 kg)] 159 lb 9.8 oz (72.4 kg) (04/04 1812)  PHYSICAL EXAMINATION: General: frail elderly female in NAD, lying in bed HEENT: MM pink/moist, upper airway wheezing  Neuro: Awakens to voice, speech clear, MAE CV: s1s2 rrr, no m/r/g PULM: even/non-labored, lungs bilaterally with diffuse wheeze  NW:GNFA, non-tender, bsx4 active  Extremities: warm/dry, no edema  Skin: no rashes or lesions    Recent Labs Lab 10/08/16 1908 10/09/16 0542  NA 141 141  K 4.3 4.9  CL 106 109  CO2 25 22  BUN 19 22*  CREATININE 1.29*  1.40*  GLUCOSE 88 96    Recent Labs Lab 10/08/16 1908 10/09/16 0542  HGB 13.1 12.1  HCT 39.6 37.1  WBC 10.0 7.2  PLT 313 312   Dg Chest Port 1 View  Result Date: 10/08/2016 CLINICAL DATA:  83 year old with acute onset of cough, chest congestion and generalized weakness earlier today. Current history of asthma. Personal history of stroke. EXAM: PORTABLE CHEST 1 VIEW COMPARISON:  07/04/2016, 05/05/2016 and earlier. FINDINGS: Cardiac silhouette normal in size, unchanged. Thoracic aorta tortuous and mildly atherosclerotic, unchanged. Hilar and mediastinal contours otherwise unremarkable. Mild atelectasis at the right lung base. Lungs otherwise clear. Pulmonary vascularity normal. No visible pleural effusions. IMPRESSION: 1. Mild atelectasis at the right lung base. No acute cardiopulmonary disease otherwise. 2. Thoracic aortic atherosclerosis. Electronically Signed   By: Evangeline Dakin M.D.   On: 10/08/2016 18:52    ASSESSMENT / PLAN:  Severe persistent asthma with acute exacerbation  Plan: Continue Brovana + Pulmicort  Will transition to the above at discharge due to inability to adequately inhale Symbicort  Solumedrol 40 mg IV Q6    Suspect aspiration pneumonia  Plan: HCAP coverage Zosyn  D/C vanco  Pulmonary hygiene - upright positioning, aspiration precautions, turning etc Assess swallow evaluation  Narrow abx as able Follow CXR   AKI  Plan: Gentle IVF's Trend BMP / urinary output Replace electrolytes as indicated Avoid nephrotoxic agents, ensure adequate renal perfusion D/C vancomycin with rising sr cr   Hx  CVA, Dementia Plan: Continue Aggrenox + Plavix  Continue Namenda   Diet: soft diet DVT: lovenox + SCD's  Noe Gens, NP-C [facility] Pgr: [phone] or if no answer [phone] 10/09/2016, 9:19 AM

## 2016-10-09 NOTE — Care Management Note (Signed)
Case Management Note  Patient Details  Name: Darlene Murray MRN: [id number] Date of Birth: [birth year]  Subjective/Objective:      resp. Distress despite outpt treatment              Action/Plan:Date:  October 09, 2016 Chart reviewed for concurrent status and case management needs. Will continue to follow patient progress. Discharge Planning: following for needs Expected discharge date: [id number] Velva Harman, BSN, [place], [place]   Expected Discharge Date:   (unknown)               Expected Discharge Plan:  [place]  In-House Referral:  Clinical Social Work  Discharge planning Services     Post Acute Care Choice:    Choice offered to:     DME Arranged:    DME Agency:     HH Arranged:    [place] Agency:     Status of Service:  In process, will continue to follow  If discussed at Long Length of Stay Meetings, dates discussed:    Additional Comments:  Leeroy Cha, RN 10/09/2016, 11:11 AM

## 2016-10-09 NOTE — Evaluation (Signed)
Clinical/Bedside Swallow Evaluation Patient Details  Name: Darlene Murray MRN: [id number] Date of Birth: [birth year]  Today's Date: 10/09/2016 Time: SLP Start Time (ACUTE ONLY): 1255 SLP Stop Time (ACUTE ONLY): 1314 SLP Time Calculation (min) (ACUTE ONLY): 19 min  Past Medical History:  Past Medical History:  Diagnosis Date  . Acute left hemiparesis ([place])   . Allergic rhinitis   . Antibiotic-associated diarrhea   . Asthma    Moderate, persistent  . Asthmatic bronchitis   . Blindness    Legally blind, right eye  . Cough   . Fibrocystic breast disease   . HNP (herniated nucleus pulposus), lumbar    L4-L5  . Hyperlipidemia   . Left ventricular hypertrophy 2008   mild  . Physical deconditioning   . Protein calorie malnutrition ([place])   . Right sided weakness   . Right-sided lacunar infarction ([place])   . Stroke (cerebrum) ([place])   . Venous insufficiency    lower extremities   Past Surgical History:  Past Surgical History:  Procedure Laterality Date  . CATARACT EXTRACTION    . COLONOSCOPY     HPI:  83 yo female referred for swallow evaluation by Dr Elsworth Soho.  Pt admitted with severe asthma exacerbation.  Pt PMH + for multiple CVAs, asthma.  Pt found to have right lung base ATX 10/08/16 and concern present for aspiration.     Assessment / Plan / Recommendation Clinical Impression  Pt being fed by spouse upon SLP entrance to room.  Note pt with congested coughing and expectoration of viscous secretions.  Pt denies dysphagia but has had multiple strokes and now has pneumonia.  Spouse reports pt has had prior swallow evaluations but suspect only clinical evaluations from his description.  Improved tolerance of nectar via cup over thin noted.  Pt with overt coughing with thin liquids = expectorating viscous secretions.   Suspect discoordination of swallow/breathing impacting pt's swallow.    Recommend modify diet to mitigate risk and plan MBS next am.  Advised spouse not to feed pt if pt is  coughing or short of breath.  Will proceed with MBS next date to allow instrumental evaluation given pt's hx CVA/respiratory deficits- pt and spouse agreeable to plan.  SLP Visit Diagnosis: Dysphagia, oropharyngeal phase (R13.12)    Aspiration Risk  Moderate aspiration risk    Diet Recommendation Nectar-thick liquid;Ice chips PRN after oral care   Liquid Administration via: Cup;Spoon;No straw Medication Administration: Whole meds with puree Supervision: Full supervision/cueing for compensatory strategies;Staff to assist with self feeding Compensations: Slow rate;Small sips/bites Postural Changes: Remain upright for at least 30 minutes after po intake;Seated upright at 90 degrees    Other  Recommendations Oral Care Recommendations: Oral care QID   Follow up Recommendations        Frequency and Duration min 2x/week  2 weeks       Prognosis Prognosis for Safe Diet Advancement: Fair      Swallow Study   General Date of Onset: (P) 10/09/16 HPI: 83 yo female referred for swallow evaluation by Dr Elsworth Soho.  Pt admitted with severe asthma exacerbation.  Pt PMH + for multiple CVAs, asthma.  Pt found to have right lung base ATX 10/08/16 and concern present for aspiration.   Type of Study: (P) Bedside Swallow Evaluation Diet Prior to this Study: (P) Dysphagia 3 (soft);Thin liquids Temperature Spikes Noted: (P) No Respiratory Status: (P) Room air History of Recent Intubation: (P) No Behavior/Cognition: (P) Cooperative;Alert;Pleasant mood Oral Cavity Assessment: (P) Within Functional Limits Oral  Care Completed by SLP: (P) No Oral Cavity - Dentition: (P) Dentures, top;Dentures, bottom Self-Feeding Abilities: (P) Needs assist Patient Positioning: (P) Upright in bed Baseline Vocal Quality: (P) Hoarse Volitional Cough: (P) Strong (productive) Volitional Swallow: (P) Able to elicit    Oral/Motor/Sensory Function Overall Oral Motor/Sensory Function: Generalized oral weakness   Ice Chips Ice  chips: Within functional limits Presentation: Spoon   Thin Liquid Thin Liquid: Impaired Presentation: Straw;Cup;Spoon Oral Phase Impairments: Reduced labial seal Pharyngeal  Phase Impairments: Cough - Immediate Other Comments: increased wheeze    Nectar Thick Nectar Thick Liquid: Impaired Presentation: Cup;Spoon Pharyngeal Phase Impairments: Throat Clearing - Delayed   Honey Thick Honey Thick Liquid: Not tested   Puree Puree: Not tested   Solid   GO   Solid: Impaired Oral Phase Impairments: Impaired mastication;Reduced lingual movement/coordination Oral Phase Functional Implications: Prolonged oral transit;Impaired mastication Pharyngeal Phase Impairments: Cough - Delayed Other Comments: cough        Luanna Salk, [place] [facility] SLP [phone]

## 2016-10-09 NOTE — Progress Notes (Signed)
Pharmacy Antibiotic Note  Darlene Murray is a 83 y.o. female admitted on 10/08/2016 with aspiration PNA.  Pharmacy has been consulted Unasyn dosing. CXR clear, WBC WNL, AF, Creat 1.29>1.4. Creat Cl ~ 29 ml/min.  Vanc/zosyn >> Unasyn.   Plan:Unasyn 1.5 gm IV q12h, if creat cl improves to > 30 ml/min can increase to q8h interval.   Height: 5\' 4"  (162.6 cm) Weight: 159 lb 9.8 oz (72.4 kg) IBW/kg (Calculated) : 54.7  Temp (24hrs), Avg:98.1 F (36.7 C), Min:97.5 F (36.4 C), Max:98.5 F (36.9 C)   Recent Labs Lab 10/08/16 1908 10/09/16 0542  WBC 10.0 7.2  CREATININE 1.29* 1.40*    Estimated Creatinine Clearance: 25 mL/min (A) (by C-G formula based on SCr of 1.4 mg/dL (H)).    Allergies  Allergen Reactions  . Nsaids Other (See Comments)    Unknown. Per SNIF MAR.     Antimicrobials this admission:  4/4 vancomycin >> 4/5 4/4 Zosyn >> 4/5 4/5 Unasyn>>  Dose adjustments this admission: ---  Microbiology results:   Thank you for allowing pharmacy to be a part of this patient's care.  Eudelia Bunch, Pharm.D. [phone] 10/09/2016 1:03 PM

## 2016-10-09 NOTE — Progress Notes (Signed)
Initial Nutrition Assessment  DOCUMENTATION CODES:   Non-severe (moderate) malnutrition in context of acute illness/injury  INTERVENTION:   Continue Ensure Enlive po BID, each supplement provides 350 kcal and 20 grams of protein Encourage PO intake RD to continue to monitor for needs  NUTRITION DIAGNOSIS:   Malnutrition related to acute illness as evidenced by percent weight loss, mild depletion of muscle mass.  GOAL:   Patient will meet greater than or equal to 90% of their needs  MONITOR:   PO intake, Supplement acceptance, Labs, Weight trends, Skin, I & O's  REASON FOR ASSESSMENT:   Malnutrition Screening Tool    ASSESSMENT:    83 year old woman with severe persistent asthma (maintenance Symbicort) was brought in to the office today as an acute visit by her husband and son. Unfortunately she has suffered several strokes and is now in a nursing home. She is wheelchair-bound and dependent for all activities of daily living. Apparently she had a swallow study and was cleared for a full diet. She has been having increasing wheezing for many months as per her family members. She is prednisone dependent and on her last visit with me 6 months ago she was on about 5-10 mg of prednisone.   Patient in room with no family at bedside. Pt HOH and wheelchair bound. Does require feeding assistance. Pt states her appetite has been poor for 2-3 days PTA. Pt ate eggs, coffee and OJ this morning for breakfast. Pt states she is tolerating the soft diet and does not need chopped meats at this time. Pt plans to drink the Ensure supplements provided, states she normally consumes 1 a day at home.   Per chart review, pt has lost 18 lb since December 2017 (10% wt loss x 3 months, significant for time frame). Nutrition-Focused physical exam completed. Findings are no fat depletion, mild muscle depletion, and no edema.   Medications: Remeron tablet daily  Labs reviewed: Mg WNL GFR: 37  Diet Order:   DIET SOFT Room service appropriate? Yes; Fluid consistency: Thin  Skin:  Wound (see comment) (non-pressure wound on thigh, Stage II sacral pressure injury)  Last BM:  3/31  Height:   Ht Readings from Last 1 Encounters:  10/08/16 5\' 4"  (1.626 m)    Weight:   Wt Readings from Last 1 Encounters:  10/08/16 159 lb 9.8 oz (72.4 kg)    Ideal Body Weight:  54.5 kg  BMI:  Body mass index is 27.4 kg/m.  Estimated Nutritional Needs:   Kcal:  1500-1700  Protein:  75-85g  Fluid:  1.7L/day  EDUCATION NEEDS:   No education needs identified at this time  Clayton Bibles, MS, RD, LDN Pager: [phone] After Hours Pager: [phone]

## 2016-10-09 NOTE — Progress Notes (Signed)
BSE completed, full report to follow.  Pt being fed by spouse upon SLP entrance to room.  Note pt with congested coughing and expectoration of viscous secretions.  Pt denies dysphagia but has had multiple strokes and now has pneumonia.  Spouse reports pt has had prior swallow evaluations but suspect only clinical evaluations from his description.    Improved tolerance of nectar via cup over thin noted.  Pt with overt coughing with thin liquids = expectorating viscous secretions.     Recommend modify diet to mitigate risk and plan MBS next am.  Advised spouse not to feed pt if pt is coughing or short of breath.    Pt tentatively on schedule for approximately 745 am 4/6 for MBS - full report to follow.  Student RN informed and requested to inform pt and spouse via telephone.    Luanna Salk, [facility] SLP [phone]

## 2016-10-10 ENCOUNTER — Inpatient Hospital Stay (HOSPITAL_COMMUNITY): Payer: Medicare Other

## 2016-10-10 DIAGNOSIS — R1312 Dysphagia, oropharyngeal phase: Secondary | ICD-10-CM

## 2016-10-10 LAB — BASIC METABOLIC PANEL
Anion gap: 10 (ref 5–15)
BUN: 32 mg/dL — AB (ref 6–20)
CALCIUM: 8.5 mg/dL — AB (ref 8.9–10.3)
CHLORIDE: 109 mmol/L (ref 101–111)
CO2: 24 mmol/L (ref 22–32)
CREATININE: 1.51 mg/dL — AB (ref 0.44–1.00)
GFR calc Af Amer: 33 mL/min — ABNORMAL LOW (ref >60)
GFR calc non Af Amer: 29 mL/min — ABNORMAL LOW (ref >60)
Glucose, Bld: 108 mg/dL — ABNORMAL HIGH (ref 65–99)
Potassium: 4.1 mmol/L (ref 3.5–5.1)
Sodium: 143 mmol/L (ref 135–145)

## 2016-10-10 LAB — CBC
HEMATOCRIT: 36.3 % (ref 36.0–46.0)
Hemoglobin: 12.2 g/dL (ref 12.0–15.0)
MCH: 29.1 pg (ref 26.0–34.0)
MCHC: 33.6 g/dL (ref 30.0–36.0)
MCV: 86.6 fL (ref 78.0–100.0)
Platelets: 293 10*3/uL (ref 150–400)
RBC: 4.19 MIL/uL (ref 3.87–5.11)
RDW: 15.3 % (ref 11.5–15.5)
WBC: 15 10*3/uL — ABNORMAL HIGH (ref 4.0–10.5)

## 2016-10-10 NOTE — Progress Notes (Addendum)
Modified Barium Swallow Progress Note  Patient Details  Name: Darlene Murray MRN: [id number] Date of Birth: 21-Jun-1922  Today's Date: 10/10/2016  Addendum to times:  0835-0900  Modified Barium Swallow completed.  Full report located under Chart Review in the Imaging Section.  Brief recommendations include the following:  Clinical Impression  Pt with mild oropharyngeal dysphagia resulting in premature spillage of boluses into pharynx.  Due to discoordination, pt unable to orally transit barium tablet with thin nor pudding and expectorated it per SLP cue.   Pt separated barium tablet from pudding and thin.   Pt with intermittent delay in pharyngeal swallow reflex - boluses retained in pharynx with pt stating "I swallowed".  Cues required to swallow after approx 40 seconds of boluses retained.  No aspiration/penetration.  Upon esophageal sweep, pt with appearance of residuals from distal to mid-esophagus without sensation.  Consumption of liquids faciliated clearance.  Of note, pt did NOT cough during MBS but she continued with watery eyes.  Using live video, educated pt to findings/recommendations.     Swallow Evaluation Recommendations       SLP Diet Recommendations: Dysphagia 3 (Mech soft) solids;Thin liquid   Liquid Administration via: Cup;Straw   Medication Administration: Whole meds with puree   Supervision: Full assist for feeding;Full supervision/cueing for compensatory strategies   Compensations: Slow rate;Small sips/bites; drink liquids throughout meals to facilitate clearance       Oral Care Recommendations: Oral care BID        Luanna Salk, [facility] SLP [phone]

## 2016-10-10 NOTE — Progress Notes (Signed)
83 year old woman with severe persistent asthma Exacerbation - no respiratory distress head and her bronchus pleasant and is much improved on exam mild bilateral scattered rhonchi  Can decrease from Medrol to 40 every 12 AKI - She does not seem to have diurresed with Lasix, Continues to have bipedal edema, creatinine increased slightly to 1.5, we'll discontinue Lasix and observe serial creatinine  She is on dysphagia 3 diet now Based on swallow evaluation  She can transferred to [facility] service, will likely need to stay in the hospital another 1-2 days  Rigoberto Noel. MD

## 2016-10-10 NOTE — Progress Notes (Signed)
Name: Darlene Murray MRN: [id number] DOB: 1922-03-01    ADMISSION DATE:  10/08/2016  CHIEF COMPLAINT:  increased wheezing    HISTORY OF PRESENT ILLNESS:  83 year old woman with severe persistent asthma (maintenance Symbicort) was brought in to the office today as an acute visit by her husband and son. Unfortunately she has suffered several strokes and is now in a nursing home. She is wheelchair-bound and dependent for all activities of daily living. Apparently she had a swallow study and was cleared for a full diet. She has been having increasing wheezing for many months as per her family members. She is prednisone dependent and on her last visit with me 6 months ago she was on about 5-10 mg of prednisone. They're not clear on whether she can take Symbicort and albuterol MDI with a spacer. They report cough but she is really not able to expectorate. Chest x-ray from [facility] on 4/2 showed bibasal pneumonia and a small left effusion.  No fevers or change in mental status or burning micturition  SUBJECTIVE:   Pt reports feeling much better.  Wheezing decreased.  No acute events overnight.   VITAL SIGNS: Temp:  [97.9 F (36.6 C)-98.3 F (36.8 C)] 98.1 F (36.7 C) (04/06 1356) Pulse Rate:  [72-91] 91 (04/06 1356) Resp:  [18-20] 18 (04/06 1356) BP: (99-115)/(44-58) 103/53 (04/06 1356) SpO2:  [96 %-100 %] 100 % (04/06 1356) Weight:  [165 lb 9.1 oz (75.1 kg)] 165 lb 9.1 oz (75.1 kg) (04/06 0515)  PHYSICAL EXAMINATION: General: elderly female in NAD, sitting up in bed HEENT: MM pink/moist, no jvd, decreased upper airway wheeze PSY: calm/appropriate Neuro: AAOx4, speech clear, MAE CV: s1s2 rrr, no m/r/g PULM: even/non-labored, lungs bilaterally with significantly reduced wheeze DD:UKGU, non-tender, bsx4 active  Extremities: warm/dry, no edema  Skin: no rashes or lesions    Recent Labs Lab 10/08/16 1908 10/09/16 0542 10/10/16 0637  NA 141 141 143  K 4.3 4.9 4.1  CL 106 109 109    CO2 25 22 24   BUN 19 22* 32*  CREATININE 1.29* 1.40* 1.51*  GLUCOSE 88 96 108*    Recent Labs Lab 10/08/16 1908 10/09/16 0542 10/10/16 0637  HGB 13.1 12.1 12.2  HCT 39.6 37.1 36.3  WBC 10.0 7.2 15.0*  PLT 313 312 293   Dg Chest Port 1 View  Result Date: 10/10/2016 CLINICAL DATA:  Aspiration pneumonitis EXAM: PORTABLE CHEST 1 VIEW COMPARISON:  October 08, 2016 FINDINGS: There is subsegmental atelectasis in the left base. Lungs elsewhere are clear. Heart is upper normal in size with pulmonary vascularity within normal limits. No adenopathy. There are foci of atherosclerotic calcification in the aorta. No bone lesions. IMPRESSION: There is slight atelectasis in the left base. No consolidation. Stable cardiac silhouette. There is aortic atherosclerosis. Electronically Signed   By: Lowella Grip III M.D.   On: 10/10/2016 07:10   Dg Chest Port 1 View  Result Date: 10/08/2016 CLINICAL DATA:  83 year old with acute onset of cough, chest congestion and generalized weakness earlier today. Current history of asthma. Personal history of stroke. EXAM: PORTABLE CHEST 1 VIEW COMPARISON:  07/04/2016, 05/05/2016 and earlier. FINDINGS: Cardiac silhouette normal in size, unchanged. Thoracic aorta tortuous and mildly atherosclerotic, unchanged. Hilar and mediastinal contours otherwise unremarkable. Mild atelectasis at the right lung base. Lungs otherwise clear. Pulmonary vascularity normal. No visible pleural effusions. IMPRESSION: 1. Mild atelectasis at the right lung base. No acute cardiopulmonary disease otherwise. 2. Thoracic aortic atherosclerosis. Electronically Signed   By: Marcello Moores  Lawrence M.D.   On: 10/08/2016 18:52   Dg Swallowing Func-speech Pathology  Result Date: 10/10/2016 Objective Swallowing Evaluation: Type of Study: MBS-Modified Barium Swallow Study Patient Details Name: Darlene Murray MRN: [id number] Date of Birth: December 19, 1921 Today's Date: 10/10/2016 Time: SLP Start Time (ACUTE ONLY): 0815-SLP  Stop Time (ACUTE ONLY): 0835 SLP Time Calculation (min) (ACUTE ONLY): 20 min Past Medical History: Past Medical History: Diagnosis Date . Acute left hemiparesis ([place])  . Allergic rhinitis  . Antibiotic-associated diarrhea  . Asthma   Moderate, persistent . Asthmatic bronchitis  . Blindness   Legally blind, right eye . Cough  . Fibrocystic breast disease  . HNP (herniated nucleus pulposus), lumbar   L4-L5 . Hyperlipidemia  . Left ventricular hypertrophy 2008  mild . Physical deconditioning  . Protein calorie malnutrition ([place])  . Right sided weakness  . Right-sided lacunar infarction ([place])  . Stroke (cerebrum) ([place])  . Venous insufficiency   lower extremities Past Surgical History: Past Surgical History: Procedure Laterality Date . CATARACT EXTRACTION   . COLONOSCOPY   HPI: 83 yo female referred for swallow evaluation by Dr Elsworth Soho.  Pt admitted with severe asthma exacerbation.  Pt PMH + for multiple CVAs, asthma.  Pt found to have right lung base ATX 10/08/16 and concern present for aspiration.   Subjective: pt awake in chair Assessment / Plan / Recommendation CHL IP CLINICAL IMPRESSIONS 10/10/2016 Clinical Impression Pt with mild oropharyngeal dysphagia resulting in premature spillage of boluses into pharynx.  Due to discoordination, pt unable to orally transit barium tablet with thin nor pudding and expectorated it per SLP cue.   Pt separated barium tablet from pudding and thin.   Pt with intermittent delay in pharyngeal swallow reflex - boluses retained in pharynx with pt stating "I swallowed".  ? some cognitive based swallowing deficits.  Cues required to swallow after approx 40 seconds of boluses retained.  No aspiration/penetration. Upon esophageal sweep, pt with appearance of residuals from distal to mid-esophagus without sensation.  Consumption of liquids faciliated clearance.  Of note, pt did NOT cough during MBS but she continued with watery eyes.  Using live video, educated pt to findings/recommendations.   SLP  Visit Diagnosis Dysphagia, oropharyngeal phase (R13.12) Attention and concentration deficit following -- Frontal lobe and executive function deficit following -- Impact on safety and function Mild aspiration risk   CHL IP TREATMENT RECOMMENDATION 10/10/2016 Treatment Recommendations Therapy as outlined in treatment plan below   Prognosis 10/10/2016 Prognosis for Safe Diet Advancement Good Barriers to Reach Goals -- Barriers/Prognosis Comment -- CHL IP DIET RECOMMENDATION 10/10/2016 SLP Diet Recommendations Dysphagia 3 (Mech soft) solids;Thin liquid Liquid Administration via Cup;Straw Medication Administration Whole meds with puree Compensations Slow rate;Small sips/bites Postural Changes --   CHL IP OTHER RECOMMENDATIONS 10/10/2016 Recommended Consults -- Oral Care Recommendations Oral care BID Other Recommendations --   CHL IP FOLLOW UP RECOMMENDATIONS 10/10/2016 Follow up Recommendations (No Data)   CHL IP FREQUENCY AND DURATION 10/10/2016 Speech Therapy Frequency (ACUTE ONLY) min 1 x/week Treatment Duration 1 week      CHL IP ORAL PHASE 10/10/2016 Oral Phase Impaired Oral - Pudding Teaspoon -- Oral - Pudding Cup -- Oral - Honey Teaspoon -- Oral - Honey Cup -- Oral - Nectar Teaspoon -- Oral - Nectar Cup Premature spillage Oral - Nectar Straw -- Oral - Thin Teaspoon Premature spillage Oral - Thin Cup Premature spillage Oral - Thin Straw Premature spillage Oral - Puree Premature spillage Oral - Mech Soft -- Oral - Regular Reduced posterior  propulsion Oral - Multi-Consistency -- Oral - Pill Reduced posterior propulsion;Decreased bolus cohesion;Premature spillage Oral Phase - Comment --  CHL IP PHARYNGEAL PHASE 10/10/2016 Pharyngeal Phase Impaired Pharyngeal- Pudding Teaspoon -- Pharyngeal -- Pharyngeal- Pudding Cup -- Pharyngeal -- Pharyngeal- Honey Teaspoon -- Pharyngeal -- Pharyngeal- Honey Cup -- Pharyngeal -- Pharyngeal- Nectar Teaspoon -- Pharyngeal -- Pharyngeal- Nectar Cup Lateral channel residue Pharyngeal -- Pharyngeal-  Nectar Straw NT Pharyngeal -- Pharyngeal- Thin Teaspoon Lateral channel residue Pharyngeal -- Pharyngeal- Thin Cup Delayed swallow initiation-vallecula;Lateral channel residue Pharyngeal -- Pharyngeal- Thin Straw NT;Lateral channel residue Pharyngeal -- Pharyngeal- Puree Delayed swallow initiation-vallecula Pharyngeal -- Pharyngeal- Mechanical Soft -- Pharyngeal -- Pharyngeal- Regular -- Pharyngeal -- Pharyngeal- Multi-consistency -- Pharyngeal -- Pharyngeal- Pill NT Pharyngeal -- Pharyngeal Comment --  CHL IP CERVICAL ESOPHAGEAL PHASE 10/10/2016 Cervical Esophageal Phase WFL Pudding Teaspoon -- Pudding Cup -- Honey Teaspoon -- Honey Cup -- Nectar Teaspoon -- Nectar Cup -- Nectar Straw -- Thin Teaspoon -- Thin Cup -- Thin Straw -- Puree -- Mechanical Soft -- Regular -- Multi-consistency -- Pill -- Cervical Esophageal Comment appearance of decreased clearance of esophagus without pt sensation, providing pt with water mixed with barium faciliated clearance Luanna Salk, MS [facility] SLP [phone]               ASSESSMENT / PLAN:  Severe persistent asthma with acute exacerbation  Plan: Continue Brovana + Pulmicort  She will need the above at discharge as we suspect she can no longer adequately inhale Symbicort  Decrease solu-medrol to 40 mg IV Q12    Suspect aspiration pneumonia  Plan: Continue zosyn, D2/7 Pulmonary hygiene - IS, mobilize, upright positioning  SLP following, appreciate input  Narrow abx as able Trend CXR  Leukocytosis - suspect in setting of steroids  Plan: Monitor WBC trend    AKI  Plan: KVO IVF's Trend BMP / urinary output Replace electrolytes as indicated Avoid nephrotoxic agents, ensure adequate renal perfusion   Hx CVA, Dementia Plan: Continue Namenda Continue Aggrenox + Plavix  Diet: SLP following DVT: lovenox + SCD's  Transfer to [facility] as of 4/7 0600.    Noe Gens, NP-C [facility] Pgr: [phone] or if no answer [phone] 10/10/2016, 2:52  PM

## 2016-10-10 NOTE — Clinical Social Work Note (Signed)
Clinical Social Work Assessment  Patient Details  Name: Darlene Murray MRN: [id number] Date of Birth: 12/18/1921  Date of referral:  10/10/16               Reason for consult:  Facility Placement                Permission sought to share information with:  Family Supports, Facility Contact Representative Permission granted to share information::     Name::      John Wandel   Agency::  [facility]  Relationship::   Spouse  Contact Information:   [phone]  Housing/Transportation Living arrangements for the past 2 months:  Skilled Nursing Facility Source of Information:  Patient Patient Interpreter Needed:  None Criminal Activity/Legal Involvement Pertinent to Current Situation/Hospitalization:  No - Comment as needed Significant Relationships:  Spouse Lives with:  Facility Resident Do you feel safe going back to the place where you live?  Yes Need for family participation in patient care:  Yes (Comment)  Care giving concerns:  No concerns presented.    Social Worker assessment / plan:  CSW met with patient at bedside, explain role and reason for visit, to assist with return back to [facility]. Patient pleasant, sitting up, oriented and eating breakfast. Patient gave CSW permission to talk with spouse. Patient spouse reports the patient will return to [facility]. He has been working on long term placement there.  Plan: compete FL2, check PASRR, and assist with return.   Employment status:  Retired Insurance information:  Medicare PT Recommendations:  Not assessed at this time Information / Referral to community resources:     Patient/Family's Response to care:  Agreeable and responding well to care.   Patient/Family's Understanding of and Emotional Response to Diagnosis, Current Treatment, and Prognosis:  Patient spouse is very supportive and involved in patient care.   Emotional Assessment Appearance:  Developmentally appropriate Attitude/Demeanor/Rapport:    Affect  (typically observed):  Accepting Orientation:  Oriented to Self, Oriented to Place Alcohol / Substance use:  Not Applicable Psych involvement (Current and /or in the community):  No (Comment)  Discharge Needs  Concerns to be addressed:  Discharge Planning Concerns Readmission within the last 30 days:  No Current discharge risk:  None Barriers to Discharge:  Continued Medical Work up    A , LCSW 10/10/2016, 9:49 AM  

## 2016-10-10 NOTE — NC FL2 (Signed)
[place] LEVEL OF CARE SCREENING TOOL     IDENTIFICATION  Patient Name: Darlene Murray Birthdate: 04-Jun-1922 Sex: female Admission Date (Current Location): 10/08/2016  [facility] and [place] Number:  Herbalist and Address:  [facility],  [place] [address] [place]      Provider Number: [id number]  Attending Physician Name and Address:  Rigoberto Noel, MD  Relative Name and Phone Number:       Current Level of Care: Hospital Recommended Level of Care: [place] Prior Approval Number:    Date Approved/Denied:   PASRR Number: [id number]  Discharge Plan: SNF    Current Diagnoses: Patient Active Problem List   Diagnosis Date Noted  . Pressure injury of skin 10/09/2016  . Severe asthma with acute exacerbation 10/08/2016  . Polypharmacy 10/02/2016  . Acute respiratory failure with hypoxia ([place]) 07/07/2016  . Influenza with respiratory manifestation 07/07/2016  . Moderate dementia without behavioral disturbance 07/07/2016  . Severe persistent asthma with exacerbation   . COPD exacerbation ([place])   . Stage 3 chronic kidney disease   . History of stroke   . Dyspnea, unspecified 05/05/2016  . Residual cognitive deficit as late effect of stroke 04/15/2016  . Neuropathic pain of shoulder 11/29/2015  . Decreased GFR   . Asthma, severe persistent   . Thalamic infarct, acute ([place]) 09/13/2015  . Right-sided lacunar infarction ([place])   . History of CVA with residual deficit   . Asthma, moderate persistent   . Left-sided weakness 09/11/2015  . CVA (cerebral vascular accident) ([place])   . Acromioclavicular joint arthritis 08/20/2015  . Acute prerenal azotemia 08/02/2015  . CKD (chronic kidney disease), stage III 08/02/2015  . Memory loss 06/15/2015  . Spondylosis, cervical, with myelopathy 06/13/2015  . Ataxia S/P CVA 05/17/2015  . Gait disturbance, post-stroke 05/17/2015  . Urinary tract infection 04/23/2015  . Acute  hemorrhagic infarction of brain ([place]) 04/10/2015  . Ataxia   . History of mixed hemorraghic embolic stroke 80/09/4915  . Weakness 04/06/2015  . Generalized weakness 04/06/2015  . Nausea with vomiting 04/06/2015  . Left ventricular diastolic dysfunction with preserved systolic function 91/50/5697  . Severe persistent asthma in adult steroid dependent   . Allergic rhinitis   . Hyperlipidemia   . Venous insufficiency   . HNP (herniated nucleus pulposus), lumbar   . Left ventricular hypertrophy     Orientation RESPIRATION BLADDER Height & Weight     Self, Time, Situation, Place  Normal Indwelling catheter Weight: 165 lb 9.1 oz (75.1 kg) Height:  5\' 4"  (162.6 cm)  BEHAVIORAL SYMPTOMS/MOOD NEUROLOGICAL BOWEL NUTRITION STATUS      Continent Diet (SLP Diet Recommendations: Dysphagia 3 (Mech soft) solids;Thin liquid)  AMBULATORY STATUS COMMUNICATION OF NEEDS Skin   Total Care Verbally  (Wound incisions, Pressure injury stage II)                       Personal Care Assistance Level of Assistance  Bathing, Feeding, Dressing Bathing Assistance: Maximum assistance Feeding assistance: Maximum assistance Dressing Assistance: Maximum assistance     Functional Limitations Info  Sight, Hearing, Speech Sight Info: Adequate Hearing Info: Adequate Speech Info: Adequate    SPECIAL CARE FACTORS FREQUENCY  PT (By licensed PT), OT (By licensed OT)     PT Frequency: 5 OT Frequency: 5            Contractures Contractures Info: Not present    Additional Factors Info  Code  Status, Allergies Code Status Info: Fullcode Allergies Info: Nsaids           Current Medications (10/10/2016):  This is the current hospital active medication list Current Facility-Administered Medications  Medication Dose Route Frequency Provider Last Rate Last Dose  . acetaminophen (TYLENOL) tablet 650 mg  650 mg Oral Q4H PRN Kara Mead V, MD      . albuterol (PROVENTIL) (2.5 MG/3ML) 0.083% nebulizer  solution 2.5 mg  2.5 mg Nebulization Q4H PRN Donita Brooks, NP   2.5 mg at 10/09/16 1115  . ampicillin-sulbactam (UNASYN) 1.5 g in sodium chloride 0.9 % 50 mL IVPB  1.5 g Intravenous Q12H Kara Mead V, MD   1.5 g at 10/10/16 0600  . arformoterol (BROVANA) nebulizer solution 15 mcg  15 mcg Nebulization BID Rigoberto Noel, MD   15 mcg at 10/10/16 0756  . atorvastatin (LIPITOR) tablet 10 mg  10 mg Oral q1800 Rigoberto Noel, MD   10 mg at 10/09/16 1731  . budesonide (PULMICORT) nebulizer solution 0.5 mg  0.5 mg Nebulization BID Kara Mead V, MD   0.5 mg at 10/10/16 0756  . clopidogrel (PLAVIX) tablet 75 mg  75 mg Oral Daily Rigoberto Noel, MD   75 mg at 10/10/16 0957  . dipyridamole-aspirin (AGGRENOX) 200-25 MG per 12 hr capsule 1 capsule  1 capsule Oral BID Rigoberto Noel, MD   1 capsule at 10/10/16 0957  . enoxaparin (LOVENOX) injection 30 mg  30 mg Subcutaneous Q24H Kara Mead V, MD   30 mg at 10/09/16 2300  . feeding supplement (ENSURE ENLIVE) (ENSURE ENLIVE) liquid 237 mL  237 mL Oral BID BM Kara Mead V, MD   237 mL at 10/09/16 0955  . furosemide (LASIX) injection 40 mg  40 mg Intravenous Daily Kara Mead V, MD   40 mg at 10/10/16 1005  . ipratropium-albuterol (DUONEB) 0.5-2.5 (3) MG/3ML nebulizer solution 3 mL  3 mL Nebulization Q6H Kara Mead V, MD   3 mL at 10/10/16 0756  . methylPREDNISolone sodium succinate (SOLU-MEDROL) 40 mg/mL injection 40 mg  40 mg Intravenous Q6H Kara Mead V, MD   40 mg at 10/10/16 0600  . mirtazapine (REMERON) tablet 15 mg  15 mg Oral QHS Donita Brooks, NP         Discharge Medications: Please see discharge summary for a list of discharge medications.  Relevant Imaging Results:  Relevant Lab Results:   Additional Information ssn:[phone]  Lia Hopping, LCSW

## 2016-10-11 ENCOUNTER — Inpatient Hospital Stay (HOSPITAL_COMMUNITY): Payer: Medicare Other

## 2016-10-11 DIAGNOSIS — Z8673 Personal history of transient ischemic attack (TIA), and cerebral infarction without residual deficits: Secondary | ICD-10-CM

## 2016-10-11 DIAGNOSIS — F0390 Unspecified dementia without behavioral disturbance: Secondary | ICD-10-CM

## 2016-10-11 DIAGNOSIS — J690 Pneumonitis due to inhalation of food and vomit: Principal | ICD-10-CM

## 2016-10-11 DIAGNOSIS — N183 Chronic kidney disease, stage 3 (moderate): Secondary | ICD-10-CM

## 2016-10-11 LAB — BASIC METABOLIC PANEL
Anion gap: 9 (ref 5–15)
BUN: 35 mg/dL — ABNORMAL HIGH (ref 6–20)
CALCIUM: 8.3 mg/dL — AB (ref 8.9–10.3)
CHLORIDE: 108 mmol/L (ref 101–111)
CO2: 26 mmol/L (ref 22–32)
CREATININE: 1.26 mg/dL — AB (ref 0.44–1.00)
GFR calc non Af Amer: 36 mL/min — ABNORMAL LOW (ref >60)
GFR, EST AFRICAN AMERICAN: 42 mL/min — AB (ref >60)
Glucose, Bld: 125 mg/dL — ABNORMAL HIGH (ref 65–99)
Potassium: 3.9 mmol/L (ref 3.5–5.1)
SODIUM: 143 mmol/L (ref 135–145)

## 2016-10-11 LAB — CBC
HCT: 37.7 % (ref 36.0–46.0)
Hemoglobin: 12.5 g/dL (ref 12.0–15.0)
MCH: 29.4 pg (ref 26.0–34.0)
MCHC: 33.2 g/dL (ref 30.0–36.0)
MCV: 88.7 fL (ref 78.0–100.0)
PLATELETS: 296 10*3/uL (ref 150–400)
RBC: 4.25 MIL/uL (ref 3.87–5.11)
RDW: 15.5 % (ref 11.5–15.5)
WBC: 14.8 10*3/uL — AB (ref 4.0–10.5)

## 2016-10-11 MED ORDER — IPRATROPIUM-ALBUTEROL 0.5-2.5 (3) MG/3ML IN SOLN
3.0000 mL | Freq: Two times a day (BID) | RESPIRATORY_TRACT | Status: DC
  Administered 2016-10-11 – 2016-10-13 (×4): 3 mL via RESPIRATORY_TRACT
  Filled 2016-10-11 (×4): qty 3

## 2016-10-11 MED ORDER — METHYLPREDNISOLONE SODIUM SUCC 40 MG IJ SOLR
40.0000 mg | Freq: Two times a day (BID) | INTRAMUSCULAR | Status: DC
  Administered 2016-10-11 – 2016-10-13 (×4): 40 mg via INTRAVENOUS
  Filled 2016-10-11 (×4): qty 1

## 2016-10-11 NOTE — Evaluation (Signed)
Physical Therapy Evaluation Patient Details Name: Darlene Murray MRN: [id number] DOB: 12-17-1921 Today's Date: 10/11/2016   History of Present Illness  Pt admitted with Asthma exacerbation and with hx of multiple CVA, blindness, venous insufficiency and dementia  Clinical Impression  Pt admitted as above and presenting with functional mobility limitations 2* decreased strength/ROM all 4s. Pt significantly limited in ability to self assist but did appreciate sitting up on side of bed and able to balance with supervision level assist only.    Follow Up Recommendations SNF    Equipment Recommendations  None recommended by PT    Recommendations for Other Services OT consult     Precautions / Restrictions Precautions Precautions: Fall Restrictions Weight Bearing Restrictions: No      Mobility  Bed Mobility Overal bed mobility: Needs Assistance Bed Mobility: Supine to Sit;Sit to Supine;Rolling Rolling: Mod assist   Supine to sit: Mod assist;+2 for safety/equipment;+2 for physical assistance Sit to supine: Mod assist;Max assist;+2 for physical assistance;+2 for safety/equipment   General bed mobility comments: cues for sequence with pt attempting to assist but very ltd by decreased ROM multiple joints and generalized weakness  Transfers                 General transfer comment: Pt states "I cant stand" and does not wish to attempt.  Pt able to balance in bedside sitting x 12 min with sup only  Ambulation/Gait                Stairs            Wheelchair Mobility    Modified Rankin (Stroke Patients Only)       Balance Overall balance assessment: Needs assistance Sitting-balance support: No upper extremity supported;Feet supported Sitting balance-Leahy Scale: Good                                       Pertinent Vitals/Pain Pain Assessment: No/denies pain    Home Living Family/patient expects to be discharged to:: Skilled nursing  facility                      Prior Function Level of Independence: Needs assistance   Gait / Transfers Assistance Needed: Pt transfers bed<>chair only.  Pt states "I stand a little and they help me"  ? [place]  ADL's / Homemaking Assistance Needed: Pt currently at [place]: Right    Extremity/Trunk Assessment   Upper Extremity Assessment Upper Extremity Assessment: Generalized weakness;RUE deficits/detail;LUE deficits/detail RUE Deficits / Details: Ltd shoulder movement, arthritic changes and tremor in hands RUE Coordination: decreased fine motor LUE Deficits / Details: Ltd shoulder movement; arthritic changes and tremor noted in hands LUE Coordination: decreased fine motor    Lower Extremity Assessment Lower Extremity Assessment: RLE deficits/detail;LLE deficits/detail;Generalized weakness RLE Deficits / Details: Multiple wound dressings around ankle, DF -15 and knee flex ltd to ~ 50 LLE Deficits / Details: Multiple wound dressings in place around ankle, Dorsiflexion -15 and knee flex ltd to ~50    Cervical / Trunk Assessment Cervical / Trunk Assessment: Kyphotic  Communication   Communication: No difficulties;HOH  Cognition Arousal/Alertness: Awake/alert Behavior During Therapy: WFL for tasks assessed/performed Overall Cognitive Status: Within Functional Limits for tasks assessed  General Comments      Exercises     Assessment/Plan    PT Assessment Patient needs continued PT services  PT Problem List Decreased strength;Decreased range of motion;Decreased activity tolerance;Decreased balance;Decreased mobility       PT Treatment Interventions DME instruction;Functional mobility training;Therapeutic activities;Therapeutic exercise;Patient/family education    PT Goals (Current goals can be found in the Care Plan section)  Acute Rehab PT Goals Patient Stated  Goal: Sit up on the side of the bed PT Goal Formulation: With patient Time For Goal Achievement: 10/25/16 Potential to Achieve Goals: Fair    Frequency Min 2X/week   Barriers to discharge        Co-evaluation               End of Session   Activity Tolerance: Patient tolerated treatment well;Patient limited by fatigue Patient left: in bed;with call bell/phone within reach Nurse Communication: Mobility status PT Visit Diagnosis: Muscle weakness (generalized) (M62.81)    Time: 4166-0630 PT Time Calculation (min) (ACUTE ONLY): 31 min   Charges:   PT Evaluation $PT Eval Low Complexity: 1 Procedure PT Treatments $Therapeutic Activity: 8-22 mins   PT G Codes:         Giannie Soliday 10/11/2016, 4:01 PM

## 2016-10-11 NOTE — Progress Notes (Signed)
PROGRESS NOTE  Darlene Murray YQM:[id number] DOB: 01-08-1922 DOA: 10/08/2016 PCP: Gennette Pac, MD   LOS: 3 days   Brief Narrative: 83 year old female with severe persistent asthma (maintenance Symbicort) was seen by Dr. Bari Mantis in his office on 4/4 is an acute visit due to significant wheezing, she was admitted to stepdown same day under critical care for asthma exacerbation. She has had several strokes and is now in a nursing home. She is wheelchair-bound and dependent for all activities of daily living. Apparently she had a swallow study and was cleared for a full diet. She has been having increasing wheezing for many months as per her family members. She is prednisone dependent and on her last visit with me 6 months ago she was on about 5-10 mg of prednisone. They're not clear on whether she can take Symbicort and albuterol MDI with a spacer. They report cough but she is really not able to expectorate. Chest x-ray from [facility] on 4/2 showed bibasal pneumonia and a small left effusion.  No fevers or change in mental status or burning micturition  Assessment & Plan: Active Problems:   CKD (chronic kidney disease), stage III   History of stroke   Moderate dementia without behavioral disturbance   Severe asthma with acute exacerbation   Severe persistent asthma with acute exacerbation  -Continue Brovana and Pulmicort, pulmonology recommends to change medications to nebulizers on discharge given concern that patient may not be adequately inhaled Symbicort -Continue IV steroids, will decrease Solu-Medrol to every 12 hours -Respiratory status stable this morning, she is on room air  Suspect aspiration pneumonia  -Continue unasyn D3/7 -Pulmonary hygiene - IS, mobilize, upright positioning  -SLP following, recommended dysphagia 3 diet with thin fluids  Leukocytosis  - suspect in setting of steroids and infection  CKD III -Cr close to baseline -Avoid nephrotoxic agents, ensure  adequate renal perfusion  CVA, Dementia -Continue Namenda -Continue Aggrenox + Plavix   DVT prophylaxis: Lovenox Code Status: Full code Family Communication: no family at bedside Disposition Plan: PT eval pending, suspect she will need SNF  Consultants:   Pulmonary   Procedures:   None   Antimicrobials:  Unasyn 4/4 >>   Subjective: -Pleasantly confused, she has no complaints, denies any shortness of breath or chest pain.  Objective: Vitals:   10/11/16 0448 10/11/16 0804 10/11/16 0806 10/11/16 0807  BP: (!) 113/59     Pulse: 64     Resp: 19     Temp: 97.7 F (36.5 C)     TempSrc: Oral     SpO2: 98% 97% 97% 97%  Weight: 74.9 kg (165 lb 2 oz)     Height:        Intake/Output Summary (Last 24 hours) at 10/11/16 1123 Last data filed at 10/11/16 0800  Gross per 24 hour  Intake              430 ml  Output             1060 ml  Net             -630 ml   Filed Weights   10/08/16 1812 10/10/16 0515 10/11/16 0448  Weight: 72.4 kg (159 lb 9.8 oz) 75.1 kg (165 lb 9.1 oz) 74.9 kg (165 lb 2 oz)    Examination: Constitutional: NAD, pleasantly confused Vitals:   10/11/16 0448 10/11/16 0804 10/11/16 0806 10/11/16 0807  BP: (!) 113/59     Pulse: 64     Resp: 19  Temp: 97.7 F (36.5 C)     TempSrc: Oral     SpO2: 98% 97% 97% 97%  Weight: 74.9 kg (165 lb 2 oz)     Height:       Eyes: PERRL, lids and conjunctivae normal ENMT: Mucous membranes are moist. No oropharyngeal exudates Respiratory: very few wheezing, no crackles. Normal respiratory effort. No accessory muscle use.  Cardiovascular: Regular rate and rhythm, no murmurs / rubs / gallops. No LE edema. 2+ pedal pulses.  Abdomen: no tenderness. Bowel sounds positive.  Neurologic: moves all 4 Psychiatric: Alert and oriented x to place and person only. Normal mood.    Data Reviewed: I have personally reviewed following labs and imaging studies  CBC:  Recent Labs Lab 10/08/16 1908 10/09/16 0542  10/10/16 0637 10/11/16 0610  WBC 10.0 7.2 15.0* 14.8*  NEUTROABS 5.4  --   --   --   HGB 13.1 12.1 12.2 12.5  HCT 39.6 37.1 36.3 37.7  MCV 87.8 87.3 86.6 88.7  PLT 313 312 293 852   Basic Metabolic Panel:  Recent Labs Lab 10/08/16 1908 10/09/16 0542 10/10/16 0637 10/11/16 0610  NA 141 141 143 143  K 4.3 4.9 4.1 3.9  CL 106 109 109 108  CO2 25 22 24 26   GLUCOSE 88 96 108* 125*  BUN 19 22* 32* 35*  CREATININE 1.29* 1.40* 1.51* 1.26*  CALCIUM 9.1 8.6* 8.5* 8.3*  MG 1.8  --   --   --    GFR: Estimated Creatinine Clearance: 28.2 mL/min (A) (by C-G formula based on SCr of 1.26 mg/dL (H)). Liver Function Tests:  Recent Labs Lab 10/08/16 1908  AST 31  ALT 23  ALKPHOS 67  BILITOT 0.9  PROT 7.1  ALBUMIN 3.3*   No results for input(s): LIPASE, AMYLASE in the last 168 hours. No results for input(s): AMMONIA in the last 168 hours. Coagulation Profile: No results for input(s): INR, PROTIME in the last 168 hours. Cardiac Enzymes:  Recent Labs Lab 10/08/16 1908  TROPONINI <0.03   BNP (last 3 results)  Recent Labs  05/05/16 1521  PROBNP 16.0   HbA1C: No results for input(s): HGBA1C in the last 72 hours. CBG: No results for input(s): GLUCAP in the last 168 hours. Lipid Profile: No results for input(s): CHOL, HDL, LDLCALC, TRIG, CHOLHDL, LDLDIRECT in the last 72 hours. Thyroid Function Tests: No results for input(s): TSH, T4TOTAL, FREET4, T3FREE, THYROIDAB in the last 72 hours. Anemia Panel: No results for input(s): VITAMINB12, FOLATE, FERRITIN, TIBC, IRON, RETICCTPCT in the last 72 hours. Urine analysis:    Component Value Date/Time   COLORURINE YELLOW 07/04/2016 1535   APPEARANCEUR HAZY (A) 07/04/2016 1535   LABSPEC 1.017 07/04/2016 1535   PHURINE 6.0 07/04/2016 1535   GLUCOSEU NEGATIVE 07/04/2016 1535   HGBUR NEGATIVE 07/04/2016 1535   BILIRUBINUR NEGATIVE 07/04/2016 1535   KETONESUR NEGATIVE 07/04/2016 1535   PROTEINUR NEGATIVE 07/04/2016 1535    UROBILINOGEN 1.0 04/23/2015 1106   NITRITE NEGATIVE 07/04/2016 1535   LEUKOCYTESUR NEGATIVE 07/04/2016 1535   Sepsis Labs: Invalid input(s): PROCALCITONIN, LACTICIDVEN  No results found for this or any previous visit (from the past 240 hour(s)).    Radiology Studies: Dg Chest Port 1 View  Result Date: 10/11/2016 CLINICAL DATA:  Aspiration pneumonia EXAM: PORTABLE CHEST 1 VIEW COMPARISON:  Yesterday FINDINGS: Normal heart size and stable aortic tortuosity. Mild streaky density at the bases. No edema, effusion, or pneumothorax. IMPRESSION: History of aspiration pneumonia with stable mild opacification bases.  Electronically Signed   By: Monte Fantasia M.D.   On: 10/11/2016 07:05   Dg Chest Port 1 View  Result Date: 10/10/2016 CLINICAL DATA:  Aspiration pneumonitis EXAM: PORTABLE CHEST 1 VIEW COMPARISON:  October 08, 2016 FINDINGS: There is subsegmental atelectasis in the left base. Lungs elsewhere are clear. Heart is upper normal in size with pulmonary vascularity within normal limits. No adenopathy. There are foci of atherosclerotic calcification in the aorta. No bone lesions. IMPRESSION: There is slight atelectasis in the left base. No consolidation. Stable cardiac silhouette. There is aortic atherosclerosis. Electronically Signed   By: Lowella Grip III M.D.   On: 10/10/2016 07:10   Dg Swallowing Func-speech Pathology  Result Date: 10/10/2016 Objective Swallowing Evaluation: Type of Study: MBS-Modified Barium Swallow Study Patient Details Name: Amiera Herzberg MRN: [id number] Date of Birth: 12/05/1921 Today's Date: 10/10/2016 Time: SLP Start Time (ACUTE ONLY): 0815-SLP Stop Time (ACUTE ONLY): 0835 SLP Time Calculation (min) (ACUTE ONLY): 20 min Past Medical History: Past Medical History: Diagnosis Date . Acute left hemiparesis ([place])  . Allergic rhinitis  . Antibiotic-associated diarrhea  . Asthma   Moderate, persistent . Asthmatic bronchitis  . Blindness   Legally blind, right eye . Cough  . Fibrocystic  breast disease  . HNP (herniated nucleus pulposus), lumbar   L4-L5 . Hyperlipidemia  . Left ventricular hypertrophy 2008  mild . Physical deconditioning  . Protein calorie malnutrition ([place])  . Right sided weakness  . Right-sided lacunar infarction ([place])  . Stroke (cerebrum) ([place])  . Venous insufficiency   lower extremities Past Surgical History: Past Surgical History: Procedure Laterality Date . CATARACT EXTRACTION   . COLONOSCOPY   HPI: 83 yo female referred for swallow evaluation by Dr Elsworth Soho.  Pt admitted with severe asthma exacerbation.  Pt PMH + for multiple CVAs, asthma.  Pt found to have right lung base ATX 10/08/16 and concern present for aspiration.   Subjective: pt awake in chair Assessment / Plan / Recommendation CHL IP CLINICAL IMPRESSIONS 10/10/2016 Clinical Impression Pt with mild oropharyngeal dysphagia resulting in premature spillage of boluses into pharynx.  Due to discoordination, pt unable to orally transit barium tablet with thin nor pudding and expectorated it per SLP cue.   Pt separated barium tablet from pudding and thin.   Pt with intermittent delay in pharyngeal swallow reflex - boluses retained in pharynx with pt stating "I swallowed".  ? some cognitive based swallowing deficits.  Cues required to swallow after approx 40 seconds of boluses retained.  No aspiration/penetration. Upon esophageal sweep, pt with appearance of residuals from distal to mid-esophagus without sensation.  Consumption of liquids faciliated clearance.  Of note, pt did NOT cough during MBS but she continued with watery eyes.  Using live video, educated pt to findings/recommendations.   SLP Visit Diagnosis Dysphagia, oropharyngeal phase (R13.12) Attention and concentration deficit following -- Frontal lobe and executive function deficit following -- Impact on safety and function Mild aspiration risk   CHL IP TREATMENT RECOMMENDATION 10/10/2016 Treatment Recommendations Therapy as outlined in treatment plan below   Prognosis  10/10/2016 Prognosis for Safe Diet Advancement Good Barriers to Reach Goals -- Barriers/Prognosis Comment -- CHL IP DIET RECOMMENDATION 10/10/2016 SLP Diet Recommendations Dysphagia 3 (Mech soft) solids;Thin liquid Liquid Administration via Cup;Straw Medication Administration Whole meds with puree Compensations Slow rate;Small sips/bites Postural Changes --   CHL IP OTHER RECOMMENDATIONS 10/10/2016 Recommended Consults -- Oral Care Recommendations Oral care BID Other Recommendations --   CHL IP FOLLOW UP RECOMMENDATIONS 10/10/2016 Follow up  Recommendations (No Data)   CHL IP FREQUENCY AND DURATION 10/10/2016 Speech Therapy Frequency (ACUTE ONLY) min 1 x/week Treatment Duration 1 week      CHL IP ORAL PHASE 10/10/2016 Oral Phase Impaired Oral - Pudding Teaspoon -- Oral - Pudding Cup -- Oral - Honey Teaspoon -- Oral - Honey Cup -- Oral - Nectar Teaspoon -- Oral - Nectar Cup Premature spillage Oral - Nectar Straw -- Oral - Thin Teaspoon Premature spillage Oral - Thin Cup Premature spillage Oral - Thin Straw Premature spillage Oral - Puree Premature spillage Oral - Mech Soft -- Oral - Regular Reduced posterior propulsion Oral - Multi-Consistency -- Oral - Pill Reduced posterior propulsion;Decreased bolus cohesion;Premature spillage Oral Phase - Comment --  CHL IP PHARYNGEAL PHASE 10/10/2016 Pharyngeal Phase Impaired Pharyngeal- Pudding Teaspoon -- Pharyngeal -- Pharyngeal- Pudding Cup -- Pharyngeal -- Pharyngeal- Honey Teaspoon -- Pharyngeal -- Pharyngeal- Honey Cup -- Pharyngeal -- Pharyngeal- Nectar Teaspoon -- Pharyngeal -- Pharyngeal- Nectar Cup Lateral channel residue Pharyngeal -- Pharyngeal- Nectar Straw NT Pharyngeal -- Pharyngeal- Thin Teaspoon Lateral channel residue Pharyngeal -- Pharyngeal- Thin Cup Delayed swallow initiation-vallecula;Lateral channel residue Pharyngeal -- Pharyngeal- Thin Straw NT;Lateral channel residue Pharyngeal -- Pharyngeal- Puree Delayed swallow initiation-vallecula Pharyngeal -- Pharyngeal-  Mechanical Soft -- Pharyngeal -- Pharyngeal- Regular -- Pharyngeal -- Pharyngeal- Multi-consistency -- Pharyngeal -- Pharyngeal- Pill NT Pharyngeal -- Pharyngeal Comment --  CHL IP CERVICAL ESOPHAGEAL PHASE 10/10/2016 Cervical Esophageal Phase WFL Pudding Teaspoon -- Pudding Cup -- Honey Teaspoon -- Honey Cup -- Nectar Teaspoon -- Nectar Cup -- Nectar Straw -- Thin Teaspoon -- Thin Cup -- Thin Straw -- Puree -- Mechanical Soft -- Regular -- Multi-consistency -- Pill -- Cervical Esophageal Comment appearance of decreased clearance of esophagus without pt sensation, providing pt with water mixed with barium faciliated clearance Luanna Salk, MS [facility] SLP [phone]                Scheduled Meds: . ampicillin-sulbactam (UNASYN) IV  1.5 g Intravenous Q12H  . arformoterol  15 mcg Nebulization BID  . atorvastatin  10 mg Oral q1800  . budesonide (PULMICORT) nebulizer solution  0.5 mg Nebulization BID  . clopidogrel  75 mg Oral Daily  . dipyridamole-aspirin  1 capsule Oral BID  . enoxaparin (LOVENOX) injection  30 mg Subcutaneous Q24H  . feeding supplement (ENSURE ENLIVE)  237 mL Oral BID BM  . furosemide  40 mg Intravenous Daily  . ipratropium-albuterol  3 mL Nebulization Q6H  . methylPREDNISolone (SOLU-MEDROL) injection  40 mg Intravenous Q6H  . mirtazapine  15 mg Oral QHS   Continuous Infusions:  Marzetta Board, MD, PhD [facility] Pager [phone]  If 7PM-7AM, please contact night-coverage [email or web address] Password [facility] 10/11/2016, 11:23 AM

## 2016-10-12 NOTE — Progress Notes (Signed)
Pharmacy Antibiotic Note  Darlene Murray is a 83 y.o. female admitted on 10/08/2016 with aspiration PNA.   Pharmacy has been consulted Unasyn dosing.  Currently day#4 antibiotics. CXR without acute findings, WBC inc 14.8 (on steroids), afebrile, Scr 1.26 (est CrCl ~ 28 ml/min).  Plan: Continue Unasyn 1.5 gm IV q12h Noted plan for 7 days of therapy Monitor renal function   Height: 5\' 4"  (162.6 cm) Weight: 163 lb 12.8 oz (74.3 kg) IBW/kg (Calculated) : 54.7  Temp (24hrs), Avg:98.2 F (36.8 C), Min:97.9 F (36.6 C), Max:98.5 F (36.9 C)   Recent Labs Lab 10/08/16 1908 10/09/16 0542 10/10/16 0637 10/11/16 0610  WBC 10.0 7.2 15.0* 14.8*  CREATININE 1.29* 1.40* 1.51* 1.26*    Estimated Creatinine Clearance: 28.1 mL/min (A) (by C-G formula based on SCr of 1.26 mg/dL (H)).    Allergies  Allergen Reactions  . Nsaids Other (See Comments)    Unknown. Per SNIF MAR.     Antimicrobials this admission:  4/4 vancomycin >> 4/5 4/4 Zosyn >> 4/5 4/5 Unasyn>>  Dose adjustments this admission:  Microbiology results: none  Thank you for allowing pharmacy to be a part of this patient's care.  Netta Cedars, PharmD, BCPS Pager: [phone] 10/12/2016 10:32 AM

## 2016-10-12 NOTE — Progress Notes (Signed)
PROGRESS NOTE  Darlene Murray UYQ:[id number] DOB: Jun 05, 1922 DOA: 10/08/2016 PCP: Gennette Pac, MD   LOS: 4 days   Brief Narrative: 83 year old female with severe persistent asthma (maintenance Symbicort) was seen by Dr. Bari Mantis in his office on 4/4 is an acute visit due to significant wheezing, she was admitted to stepdown same day under critical care for asthma exacerbation. She has had several strokes and is now in a nursing home. She is wheelchair-bound and dependent for all activities of daily living. Apparently she had a swallow study and was cleared for a full diet. She has been having increasing wheezing for many months as per her family members. She is prednisone dependent and on her last visit with me 6 months ago she was on about 5-10 mg of prednisone. They're not clear on whether she can take Symbicort and albuterol MDI with a spacer. They report cough but she is really not able to expectorate. Chest x-ray from [facility] on 4/2 showed bibasal pneumonia and a small left effusion.  No fevers or change in mental status or burning micturition  Assessment & Plan: Active Problems:   CKD (chronic kidney disease), stage III   History of stroke   Moderate dementia without behavioral disturbance   Severe asthma with acute exacerbation   Severe persistent asthma with acute exacerbation  -Continue Brovana and Pulmicort, pulmonology recommends to change medications to nebulizers on discharge given concern that patient may not be adequately inhaled Symbicort -Continue IV steroids, transition to prednisone tomorrow -Respiratory status stable this morning, she is on room air  Suspect aspiration pneumonia  -Continue unasyn D3/7 -Pulmonary hygiene - IS, mobilize, upright positioning  -SLP following, recommended dysphagia 3 diet with thin fluids  Leukocytosis  - suspect in setting of steroids and infection  CKD III -Cr close to baseline -Avoid nephrotoxic agents, ensure adequate renal  perfusion  CVA, Dementia -Continue Namenda -Continue Aggrenox + Plavix   DVT prophylaxis: Lovenox Code Status: Full code Family Communication: no family at bedside Disposition Plan: PT eval pending, suspect she will need SNF  Consultants:   Pulmonary   Procedures:   None   Antimicrobials:  Unasyn 4/4 >>   Subjective: -Pleasantly confused, she has no complaints, denies any shortness of breath or chest pain.  Objective: Vitals:   10/12/16 0500 10/12/16 0533 10/12/16 0952 10/12/16 1037  BP:  111/62 121/63   Pulse:  63 70   Resp:  16    Temp:  98.2 F (36.8 C)    TempSrc:  Oral    SpO2:  95%    Weight: 74.3 kg (163 lb 12.8 oz)   74.3 kg (163 lb 12.8 oz)  Height:        Intake/Output Summary (Last 24 hours) at 10/12/16 1053 Last data filed at 10/12/16 1039  Gross per 24 hour  Intake              290 ml  Output             1350 ml  Net            -1060 ml   Filed Weights   10/11/16 0448 10/12/16 0500 10/12/16 1037  Weight: 74.9 kg (165 lb 2 oz) 74.3 kg (163 lb 12.8 oz) 74.3 kg (163 lb 12.8 oz)    Examination: Constitutional: NAD, pleasantly confused Vitals:   10/12/16 0500 10/12/16 0533 10/12/16 0952 10/12/16 1037  BP:  111/62 121/63   Pulse:  63 70   Resp:  16  Temp:  98.2 F (36.8 C)    TempSrc:  Oral    SpO2:  95%    Weight: 74.3 kg (163 lb 12.8 oz)   74.3 kg (163 lb 12.8 oz)  Height:       Eyes: PERRL, lids and conjunctivae normal ENMT: Mucous membranes are moist. No oropharyngeal exudates Respiratory: very few wheezing, no crackles. Normal respiratory effort. No accessory muscle use.  Cardiovascular: Regular rate and rhythm, no murmurs / rubs / gallops. No LE edema. 2+ pedal pulses.  Abdomen: no tenderness. Bowel sounds positive.    Data Reviewed: I have personally reviewed following labs and imaging studies  CBC:  Recent Labs Lab 10/08/16 1908 10/09/16 0542 10/10/16 0637 10/11/16 0610  WBC 10.0 7.2 15.0* 14.8*  NEUTROABS 5.4  --    --   --   HGB 13.1 12.1 12.2 12.5  HCT 39.6 37.1 36.3 37.7  MCV 87.8 87.3 86.6 88.7  PLT 313 312 293 017   Basic Metabolic Panel:  Recent Labs Lab 10/08/16 1908 10/09/16 0542 10/10/16 0637 10/11/16 0610  NA 141 141 143 143  K 4.3 4.9 4.1 3.9  CL 106 109 109 108  CO2 25 22 24 26   GLUCOSE 88 96 108* 125*  BUN 19 22* 32* 35*  CREATININE 1.29* 1.40* 1.51* 1.26*  CALCIUM 9.1 8.6* 8.5* 8.3*  MG 1.8  --   --   --    GFR: Estimated Creatinine Clearance: 28.1 mL/min (A) (by C-G formula based on SCr of 1.26 mg/dL (H)). Liver Function Tests:  Recent Labs Lab 10/08/16 1908  AST 31  ALT 23  ALKPHOS 67  BILITOT 0.9  PROT 7.1  ALBUMIN 3.3*   No results for input(s): LIPASE, AMYLASE in the last 168 hours. No results for input(s): AMMONIA in the last 168 hours. Coagulation Profile: No results for input(s): INR, PROTIME in the last 168 hours. Cardiac Enzymes:  Recent Labs Lab 10/08/16 1908  TROPONINI <0.03   BNP (last 3 results)  Recent Labs  05/05/16 1521  PROBNP 16.0   HbA1C: No results for input(s): HGBA1C in the last 72 hours. CBG: No results for input(s): GLUCAP in the last 168 hours. Lipid Profile: No results for input(s): CHOL, HDL, LDLCALC, TRIG, CHOLHDL, LDLDIRECT in the last 72 hours. Thyroid Function Tests: No results for input(s): TSH, T4TOTAL, FREET4, T3FREE, THYROIDAB in the last 72 hours. Anemia Panel: No results for input(s): VITAMINB12, FOLATE, FERRITIN, TIBC, IRON, RETICCTPCT in the last 72 hours. Urine analysis:    Component Value Date/Time   COLORURINE YELLOW 07/04/2016 1535   APPEARANCEUR HAZY (A) 07/04/2016 1535   LABSPEC 1.017 07/04/2016 1535   PHURINE 6.0 07/04/2016 1535   GLUCOSEU NEGATIVE 07/04/2016 1535   HGBUR NEGATIVE 07/04/2016 1535   BILIRUBINUR NEGATIVE 07/04/2016 1535   KETONESUR NEGATIVE 07/04/2016 1535   PROTEINUR NEGATIVE 07/04/2016 1535   UROBILINOGEN 1.0 04/23/2015 1106   NITRITE NEGATIVE 07/04/2016 1535    LEUKOCYTESUR NEGATIVE 07/04/2016 1535   Sepsis Labs: Invalid input(s): PROCALCITONIN, LACTICIDVEN  No results found for this or any previous visit (from the past 240 hour(s)).    Radiology Studies: Dg Chest Port 1 View  Result Date: 10/11/2016 CLINICAL DATA:  Aspiration pneumonia EXAM: PORTABLE CHEST 1 VIEW COMPARISON:  Yesterday FINDINGS: Normal heart size and stable aortic tortuosity. Mild streaky density at the bases. No edema, effusion, or pneumothorax. IMPRESSION: History of aspiration pneumonia with stable mild opacification bases. Electronically Signed   By: Monte Fantasia M.D.   On:  10/11/2016 07:05     Scheduled Meds: . ampicillin-sulbactam (UNASYN) IV  1.5 g Intravenous Q12H  . arformoterol  15 mcg Nebulization BID  . atorvastatin  10 mg Oral q1800  . budesonide (PULMICORT) nebulizer solution  0.5 mg Nebulization BID  . clopidogrel  75 mg Oral Daily  . dipyridamole-aspirin  1 capsule Oral BID  . enoxaparin (LOVENOX) injection  30 mg Subcutaneous Q24H  . feeding supplement (ENSURE ENLIVE)  237 mL Oral BID BM  . furosemide  40 mg Intravenous Daily  . ipratropium-albuterol  3 mL Nebulization BID  . methylPREDNISolone (SOLU-MEDROL) injection  40 mg Intravenous Q12H  . mirtazapine  15 mg Oral QHS   Continuous Infusions:  Marzetta Board, MD, PhD [facility] Pager [phone]  If 7PM-7AM, please contact night-coverage [email or web address] Password [facility] 10/12/2016, 10:53 AM

## 2016-10-12 NOTE — Progress Notes (Signed)
Foley catheter found leaking , bed was soaked with urine, 5 ml added to foley balloon,patient washed and linens changed, continue to observe foley.

## 2016-10-13 DIAGNOSIS — Z8673 Personal history of transient ischemic attack (TIA), and cerebral infarction without residual deficits: Secondary | ICD-10-CM | POA: Diagnosis not present

## 2016-10-13 DIAGNOSIS — R1310 Dysphagia, unspecified: Secondary | ICD-10-CM | POA: Diagnosis not present

## 2016-10-13 DIAGNOSIS — Z515 Encounter for palliative care: Secondary | ICD-10-CM | POA: Diagnosis not present

## 2016-10-13 DIAGNOSIS — R4182 Altered mental status, unspecified: Secondary | ICD-10-CM | POA: Diagnosis present

## 2016-10-13 DIAGNOSIS — Y95 Nosocomial condition: Secondary | ICD-10-CM | POA: Diagnosis present

## 2016-10-13 DIAGNOSIS — J4551 Severe persistent asthma with (acute) exacerbation: Secondary | ICD-10-CM | POA: Diagnosis not present

## 2016-10-13 DIAGNOSIS — J9811 Atelectasis: Secondary | ICD-10-CM | POA: Diagnosis present

## 2016-10-13 DIAGNOSIS — M6281 Muscle weakness (generalized): Secondary | ICD-10-CM | POA: Diagnosis not present

## 2016-10-13 DIAGNOSIS — L89153 Pressure ulcer of sacral region, stage 3: Secondary | ICD-10-CM | POA: Diagnosis not present

## 2016-10-13 DIAGNOSIS — E876 Hypokalemia: Secondary | ICD-10-CM | POA: Diagnosis not present

## 2016-10-13 DIAGNOSIS — Z993 Dependence on wheelchair: Secondary | ICD-10-CM | POA: Diagnosis not present

## 2016-10-13 DIAGNOSIS — J45909 Unspecified asthma, uncomplicated: Secondary | ICD-10-CM | POA: Diagnosis present

## 2016-10-13 DIAGNOSIS — F0390 Unspecified dementia without behavioral disturbance: Secondary | ICD-10-CM | POA: Diagnosis not present

## 2016-10-13 DIAGNOSIS — F0150 Vascular dementia without behavioral disturbance: Secondary | ICD-10-CM | POA: Diagnosis not present

## 2016-10-13 DIAGNOSIS — J9600 Acute respiratory failure, unspecified whether with hypoxia or hypercapnia: Secondary | ICD-10-CM | POA: Diagnosis not present

## 2016-10-13 DIAGNOSIS — R0609 Other forms of dyspnea: Secondary | ICD-10-CM | POA: Diagnosis not present

## 2016-10-13 DIAGNOSIS — J690 Pneumonitis due to inhalation of food and vomit: Secondary | ICD-10-CM | POA: Diagnosis not present

## 2016-10-13 DIAGNOSIS — Z66 Do not resuscitate: Secondary | ICD-10-CM | POA: Diagnosis not present

## 2016-10-13 DIAGNOSIS — R2681 Unsteadiness on feet: Secondary | ICD-10-CM | POA: Diagnosis not present

## 2016-10-13 DIAGNOSIS — L89152 Pressure ulcer of sacral region, stage 2: Secondary | ICD-10-CM | POA: Diagnosis present

## 2016-10-13 DIAGNOSIS — J309 Allergic rhinitis, unspecified: Secondary | ICD-10-CM | POA: Diagnosis not present

## 2016-10-13 DIAGNOSIS — I6930 Unspecified sequelae of cerebral infarction: Secondary | ICD-10-CM | POA: Diagnosis not present

## 2016-10-13 DIAGNOSIS — J189 Pneumonia, unspecified organism: Secondary | ICD-10-CM | POA: Diagnosis not present

## 2016-10-13 DIAGNOSIS — R627 Adult failure to thrive: Secondary | ICD-10-CM | POA: Diagnosis not present

## 2016-10-13 DIAGNOSIS — R102 Pelvic and perineal pain: Secondary | ICD-10-CM | POA: Diagnosis not present

## 2016-10-13 DIAGNOSIS — Z7902 Long term (current) use of antithrombotics/antiplatelets: Secondary | ICD-10-CM | POA: Diagnosis not present

## 2016-10-13 DIAGNOSIS — I69319 Unspecified symptoms and signs involving cognitive functions following cerebral infarction: Secondary | ICD-10-CM | POA: Diagnosis not present

## 2016-10-13 DIAGNOSIS — N183 Chronic kidney disease, stage 3 (moderate): Secondary | ICD-10-CM | POA: Diagnosis not present

## 2016-10-13 DIAGNOSIS — D72829 Elevated white blood cell count, unspecified: Secondary | ICD-10-CM | POA: Diagnosis not present

## 2016-10-13 DIAGNOSIS — R402441 Other coma, without documented Glasgow coma scale score, or with partial score reported, in the field [EMT or ambulance]: Secondary | ICD-10-CM | POA: Diagnosis not present

## 2016-10-13 DIAGNOSIS — E43 Unspecified severe protein-calorie malnutrition: Secondary | ICD-10-CM | POA: Diagnosis present

## 2016-10-13 DIAGNOSIS — G8194 Hemiplegia, unspecified affecting left nondominant side: Secondary | ICD-10-CM | POA: Diagnosis present

## 2016-10-13 DIAGNOSIS — R1312 Dysphagia, oropharyngeal phase: Secondary | ICD-10-CM | POA: Diagnosis not present

## 2016-10-13 DIAGNOSIS — J45901 Unspecified asthma with (acute) exacerbation: Secondary | ICD-10-CM | POA: Diagnosis not present

## 2016-10-13 DIAGNOSIS — E785 Hyperlipidemia, unspecified: Secondary | ICD-10-CM | POA: Diagnosis not present

## 2016-10-13 DIAGNOSIS — Z6826 Body mass index (BMI) 26.0-26.9, adult: Secondary | ICD-10-CM | POA: Diagnosis not present

## 2016-10-13 DIAGNOSIS — R531 Weakness: Secondary | ICD-10-CM | POA: Diagnosis not present

## 2016-10-13 DIAGNOSIS — I69393 Ataxia following cerebral infarction: Secondary | ICD-10-CM | POA: Diagnosis not present

## 2016-10-13 DIAGNOSIS — I872 Venous insufficiency (chronic) (peripheral): Secondary | ICD-10-CM | POA: Diagnosis present

## 2016-10-13 DIAGNOSIS — G9340 Encephalopathy, unspecified: Secondary | ICD-10-CM | POA: Diagnosis not present

## 2016-10-13 DIAGNOSIS — L8943 Pressure ulcer of contiguous site of back, buttock and hip, stage 3: Secondary | ICD-10-CM | POA: Diagnosis not present

## 2016-10-13 DIAGNOSIS — I69993 Ataxia following unspecified cerebrovascular disease: Secondary | ICD-10-CM | POA: Diagnosis not present

## 2016-10-13 DIAGNOSIS — E440 Moderate protein-calorie malnutrition: Secondary | ICD-10-CM | POA: Diagnosis not present

## 2016-10-13 DIAGNOSIS — I639 Cerebral infarction, unspecified: Secondary | ICD-10-CM | POA: Diagnosis not present

## 2016-10-13 DIAGNOSIS — H5461 Unqualified visual loss, right eye, normal vision left eye: Secondary | ICD-10-CM | POA: Diagnosis present

## 2016-10-13 DIAGNOSIS — L89312 Pressure ulcer of right buttock, stage 2: Secondary | ICD-10-CM | POA: Diagnosis not present

## 2016-10-13 DIAGNOSIS — B379 Candidiasis, unspecified: Secondary | ICD-10-CM | POA: Diagnosis not present

## 2016-10-13 DIAGNOSIS — R5381 Other malaise: Secondary | ICD-10-CM | POA: Diagnosis not present

## 2016-10-13 DIAGNOSIS — Z5189 Encounter for other specified aftercare: Secondary | ICD-10-CM | POA: Diagnosis not present

## 2016-10-13 DIAGNOSIS — R400 Somnolence: Secondary | ICD-10-CM | POA: Diagnosis not present

## 2016-10-13 DIAGNOSIS — Z79899 Other long term (current) drug therapy: Secondary | ICD-10-CM | POA: Diagnosis not present

## 2016-10-13 MED ORDER — PREDNISONE 10 MG PO TABS: 10.0000 mg | ORAL_TABLET | Freq: Every day | ORAL | 0 refills | Status: DC

## 2016-10-13 MED ORDER — ARFORMOTEROL TARTRATE 15 MCG/2ML IN NEBU: 15.0000 ug | INHALATION_SOLUTION | Freq: Two times a day (BID) | RESPIRATORY_TRACT | Status: AC

## 2016-10-13 MED ORDER — ALBUTEROL SULFATE (2.5 MG/3ML) 0.083% IN NEBU: 2.5000 mg | INHALATION_SOLUTION | Freq: Three times a day (TID) | RESPIRATORY_TRACT | Status: AC

## 2016-10-13 MED ORDER — AMOXICILLIN-POT CLAVULANATE 875-125 MG PO TABS: 1.0000 | ORAL_TABLET | Freq: Two times a day (BID) | ORAL | 0 refills | Status: DC

## 2016-10-13 MED ORDER — BUDESONIDE 0.5 MG/2ML IN SUSP: 0.5000 mg | Freq: Two times a day (BID) | RESPIRATORY_TRACT | 12 refills | Status: AC

## 2016-10-13 MED ORDER — ALBUTEROL SULFATE (2.5 MG/3ML) 0.083% IN NEBU: 2.5000 mg | INHALATION_SOLUTION | RESPIRATORY_TRACT | 12 refills | Status: DC | PRN

## 2016-10-13 NOTE — Progress Notes (Signed)
Pt being transferred to [facility]. Discharge instructions were given to the family members and reviewed. Report was called to [facility] and report called to ____. Pt family has no questions at this time.  Ellice Boultinghouse W Jaianna Nicoll, RN

## 2016-10-13 NOTE — Clinical Social Work Placement (Signed)
   CLINICAL SOCIAL WORK PLACEMENT  NOTE  Date:  10/13/2016  Patient Details  Name: Darlene Murray MRN: [id number] Date of Birth: 03/07/1922  Clinical Social Work is seeking post-discharge placement for this patient at the [place] level of care (*CSW will initial, date and re-position this form in  chart as items are completed):  No   Patient/family provided with [facility] Work Department's list of facilities offering this level of care within the geographic area requested by the patient (or if unable, by the patient's family).  Yes   Patient/family informed of their freedom to choose among providers that offer the needed level of care, that participate in Medicare, Medicaid or managed care program needed by the patient, have an available bed and are willing to accept the patient.  No   Patient/family informed of [facility]'s ownership interest in [facility] and [facility] as well as of the fact that they are under no obligation to receive care at these facilities.  PASRR submitted to EDS on       PASRR number received on       Existing PASRR number confirmed on 10/13/16     FL2 transmitted to all facilities in geographic area requested by pt/family on       FL2 transmitted to all facilities within larger geographic area on       Patient informed that his/her managed care company has contracts with or will negotiate with certain facilities, including the following:  [employer] informed of bed offers received.  Patient chooses bed at [facility]     Physician recommends and patient chooses bed at      Patient to be transferred to [facility] on 10/13/16.  Patient to be transferred to facility by [facility]     Patient family notified on 10/13/16 of transfer.  Name of family member notified:  [place] Please prepare priority discharge summary, including medications     Additional  Comment:    _______________________________________________ Lia Hopping, LCSW 10/13/2016, 10:07 AM

## 2016-10-13 NOTE — Discharge Summary (Signed)
Physician Discharge Summary  Gwyneth Fernandez WUX:[id number] DOB: [birth year] DOA: 10/08/2016  PCP: Gennette Pac, MD  Admit date: 10/08/2016 Discharge date: 10/13/2016  Admitted From: SNF Disposition:  SNF  Recommendations for Outpatient Follow-up:  1. Follow up with Dr. Elsworth Soho 2. Continue Augmentin for 5 more days 3. Continue prednisone taper as below then resume chronic prednisone 4. She was placed on nebulizer with Brovana and Pulmicort per pulm as suspect she can no longer adequately inhale Symbicort   Home Health: none Equipment/Devices: none  Discharge Condition: stable CODE STATUS: Full code Diet recommendation: Dysphagia 3 (Mech soft) solids;Thin liquid  HPI: 83 year old female with severe persistent asthma (maintenance Symbicort) was seen by Dr. Bari Mantis in his office on 4/4 is an acute visit due to significant wheezing, she was admitted to stepdown same day under critical care for asthma exacerbation. She has had several strokes and is now in a nursing home. She is wheelchair-bound and dependent for all activities of daily living. Apparently she had a swallow study and was cleared for a full diet. She has been having increasing wheezing for many months as per her family members. She is prednisone dependent and on her last visit with me 6 months ago she was on about 5-10 mg of prednisone. They're not clear on whether she can take Symbicort and albuterol MDI with a spacer. They report cough but she is really not able to expectorate. Chest x-ray from [facility] on 4/2 showed bibasal pneumonia and a small left effusion. No fevers or change in mental status or burning micturition  Hospital Course: Discharge Diagnoses:  Active Problems:   CKD (chronic kidney disease), stage III   History of stroke   Moderate dementia without behavioral disturbance   Severe asthma with acute exacerbation  Severe persistent asthma with acute exacerbation -initially admitted to SDU, placed on  antibiotics, nebulizers and IV steroids. She improved and was transferred to the hospitalist service on 4/7. She was placed on Brovana and Pulmicort and pulmonology recommends to change medications to nebulizers on discharge given concern that patient may not be adequately inhaled Symbicort. She was transitioned to prednisone and Augmentin, her wheezing resolved and she is back to baseline, will be discharged to SNF in stable condition to finish 5 additional days of antibiotics and a steroid taper.  Suspect aspiration pneumonia -was maintained on unasyn, transitioned to Augmentin as above. Pulmonary hygiene - IS, mobilize, upright positioning, SLP following, recommended dysphagia 3 diet with thin fluids Leukocytosis - suspect in setting of steroids and infection CKD III -Cr close to baseline CVA, Dementia -Continue Namenda, continue Aggrenox + Plavix   Discharge Instructions   Allergies as of 10/13/2016      Reactions   Nsaids Other (See Comments)   Unknown. Per SNIF MAR.       Medication List    STOP taking these medications   albuterol 108 (90 Base) MCG/ACT inhaler Commonly known as:  PROVENTIL HFA;VENTOLIN HFA Replaced by:  albuterol (2.5 MG/3ML) 0.083% nebulizer solution You also have another medication with the same name that you need to continue taking as instructed.   budesonide-formoterol 160-4.5 MCG/ACT inhaler Commonly known as:  SYMBICORT   moxifloxacin 400 MG tablet Commonly known as:  AVELOX     TAKE these medications   acetaminophen 500 MG tablet Commonly known as:  TYLENOL Take 500 mg by mouth every 8 (eight) hours as needed (pain.).   albuterol (2.5 MG/3ML) 0.083% nebulizer solution Commonly known as:  PROVENTIL Take 2.5 mg by  nebulization 3 (three) times daily. What changed:  Another medication with the same name was added. Make sure you understand how and when to take each.  Another medication with the same name was removed. Continue taking this medication,  and follow the directions you see here.   albuterol (2.5 MG/3ML) 0.083% nebulizer solution Commonly known as:  PROVENTIL Take 3 mLs (2.5 mg total) by nebulization every 4 (four) hours as needed for wheezing. What changed:  You were already taking a medication with the same name, and this prescription was added. Make sure you understand how and when to take each. Replaces:  albuterol 108 (90 Base) MCG/ACT inhaler   albuterol (2.5 MG/3ML) 0.083% nebulizer solution Commonly known as:  PROVENTIL Take 3 mLs (2.5 mg total) by nebulization 3 (three) times daily. What changed:  You were already taking a medication with the same name, and this prescription was added. Make sure you understand how and when to take each.   amoxicillin-clavulanate 875-125 MG tablet Commonly known as:  AUGMENTIN Take 1 tablet by mouth 2 (two) times daily.   arformoterol 15 MCG/2ML Nebu Commonly known as:  BROVANA Take 2 mLs (15 mcg total) by nebulization 2 (two) times daily.   atorvastatin 10 MG tablet Commonly known as:  LIPITOR Take 10 mg by mouth daily at 6 PM.   budesonide 0.5 MG/2ML nebulizer solution Commonly known as:  PULMICORT Take 2 mLs (0.5 mg total) by nebulization 2 (two) times daily.   calcium-vitamin D 500-200 MG-UNIT tablet Commonly known as:  OSCAL WITH D Take 1 tablet by mouth daily.   clopidogrel 75 MG tablet Commonly known as:  PLAVIX Take 1 tablet (75 mg total) by mouth daily.   DECUBI-VITE Caps Take 1 capsule by mouth daily.   multivitamin with minerals tablet Take 1 tablet by mouth daily.   dipyridamole-aspirin 200-25 MG 12hr capsule Commonly known as:  AGGRENOX Take 1 capsule by mouth 2 (two) times daily.   furosemide 20 MG tablet Commonly known as:  LASIX Take 20 mg by mouth daily as needed.   guaiFENesin 600 MG 12 hr tablet Commonly known as:  MUCINEX Take 600 mg by mouth every 12 (twelve) hours as needed for cough (congestion).   mirtazapine 15 MG tablet Commonly  known as:  REMERON Take 1 tablet (15 mg total) by mouth at bedtime.   polyethylene glycol powder powder Commonly known as:  GLYCOLAX/MIRALAX Take 17 g by mouth daily as needed (constipation.).   potassium chloride SA 20 MEQ tablet Commonly known as:  K-DUR,KLOR-CON Take 20 mEq by mouth daily.   predniSONE 5 MG tablet Commonly known as:  DELTASONE Take 1 tablet (5 mg total) by mouth daily with breakfast. What changed:  Another medication with the same name was added. Make sure you understand how and when to take each.   predniSONE 10 MG tablet Commonly known as:  DELTASONE Take 1 tablet (10 mg total) by mouth daily with breakfast. 4 tablets daily x 4 days then 3 daily x 4 days then 2 daily x 4 days then 1 daily x 4 days (40) What changed:  You were already taking a medication with the same name, and this prescription was added. Make sure you understand how and when to take each.   saccharomyces boulardii 250 MG capsule Commonly known as:  FLORASTOR Take 250 mg by mouth 2 (two) times daily.   UNABLE TO FIND Med Name: Med pass 120 mL by mouth daily  Contact information for follow-up providers    Rigoberto Noel., MD. Schedule an appointment as soon as possible for a visit in 2 week(s).   Specialty:  Pulmonary Disease Contact information: [address] [address] [phone]            Contact information for after-discharge care    Destination    [facility] Follow up.   Specialty:  Skilled Nursing Facility Contact information: [place] [address] [phone]                 Allergies  Allergen Reactions  . Nsaids Other (See Comments)    Unknown. Per SNIF MAR.     Consultations:  Pulmonology   Procedures/Studies:  Dg Chest Port 1 View  Result Date: 10/11/2016 CLINICAL DATA:  Aspiration pneumonia EXAM: PORTABLE CHEST 1 VIEW COMPARISON:  Yesterday FINDINGS: Normal heart size and stable aortic  tortuosity. Mild streaky density at the bases. No edema, effusion, or pneumothorax. IMPRESSION: History of aspiration pneumonia with stable mild opacification bases. Electronically Signed   By: Monte Fantasia M.D.   On: 10/11/2016 07:05   Dg Chest Port 1 View  Result Date: 10/10/2016 CLINICAL DATA:  Aspiration pneumonitis EXAM: PORTABLE CHEST 1 VIEW COMPARISON:  October 08, 2016 FINDINGS: There is subsegmental atelectasis in the left base. Lungs elsewhere are clear. Heart is upper normal in size with pulmonary vascularity within normal limits. No adenopathy. There are foci of atherosclerotic calcification in the aorta. No bone lesions. IMPRESSION: There is slight atelectasis in the left base. No consolidation. Stable cardiac silhouette. There is aortic atherosclerosis. Electronically Signed   By: Lowella Grip III M.D.   On: 10/10/2016 07:10   Dg Chest Port 1 View  Result Date: 10/08/2016 CLINICAL DATA:  83 year old with acute onset of cough, chest congestion and generalized weakness earlier today. Current history of asthma. Personal history of stroke. EXAM: PORTABLE CHEST 1 VIEW COMPARISON:  07/04/2016, 05/05/2016 and earlier. FINDINGS: Cardiac silhouette normal in size, unchanged. Thoracic aorta tortuous and mildly atherosclerotic, unchanged. Hilar and mediastinal contours otherwise unremarkable. Mild atelectasis at the right lung base. Lungs otherwise clear. Pulmonary vascularity normal. No visible pleural effusions. IMPRESSION: 1. Mild atelectasis at the right lung base. No acute cardiopulmonary disease otherwise. 2. Thoracic aortic atherosclerosis. Electronically Signed   By: Evangeline Dakin M.D.   On: 10/08/2016 18:52   Dg Swallowing Func-speech Pathology  Result Date: 10/10/2016 Objective Swallowing Evaluation: Type of Study: MBS-Modified Barium Swallow Study Patient Details Name: Chantalle Defilippo MRN: [id number] Date of Birth: 1921/10/14 Today's Date: 10/10/2016 Time: SLP Start Time (ACUTE ONLY):  0815-SLP Stop Time (ACUTE ONLY): 0835 SLP Time Calculation (min) (ACUTE ONLY): 20 min Past Medical History: Past Medical History: Diagnosis Date . Acute left hemiparesis ([place])  . Allergic rhinitis  . Antibiotic-associated diarrhea  . Asthma   Moderate, persistent . Asthmatic bronchitis  . Blindness   Legally blind, right eye . Cough  . Fibrocystic breast disease  . HNP (herniated nucleus pulposus), lumbar   L4-L5 . Hyperlipidemia  . Left ventricular hypertrophy 2008  mild . Physical deconditioning  . Protein calorie malnutrition ([place])  . Right sided weakness  . Right-sided lacunar infarction ([place])  . Stroke (cerebrum) ([place])  . Venous insufficiency   lower extremities Past Surgical History: Past Surgical History: Procedure Laterality Date . CATARACT EXTRACTION   . COLONOSCOPY   HPI: 83 yo female referred for swallow evaluation by Dr Elsworth Soho.  Pt admitted with severe asthma exacerbation.  Pt PMH + for multiple CVAs, asthma.  Pt found to have right lung base ATX 10/08/16 and concern present for aspiration.   Subjective: pt awake in chair Assessment / Plan / Recommendation CHL IP CLINICAL IMPRESSIONS 10/10/2016 Clinical Impression Pt with mild oropharyngeal dysphagia resulting in premature spillage of boluses into pharynx.  Due to discoordination, pt unable to orally transit barium tablet with thin nor pudding and expectorated it per SLP cue.   Pt separated barium tablet from pudding and thin.   Pt with intermittent delay in pharyngeal swallow reflex - boluses retained in pharynx with pt stating "I swallowed".  ? some cognitive based swallowing deficits.  Cues required to swallow after approx 40 seconds of boluses retained.  No aspiration/penetration. Upon esophageal sweep, pt with appearance of residuals from distal to mid-esophagus without sensation.  Consumption of liquids faciliated clearance.  Of note, pt did NOT cough during MBS but she continued with watery eyes.  Using live video, educated pt to  findings/recommendations.   SLP Visit Diagnosis Dysphagia, oropharyngeal phase (R13.12) Attention and concentration deficit following -- Frontal lobe and executive function deficit following -- Impact on safety and function Mild aspiration risk   CHL IP TREATMENT RECOMMENDATION 10/10/2016 Treatment Recommendations Therapy as outlined in treatment plan below   Prognosis 10/10/2016 Prognosis for Safe Diet Advancement Good Barriers to Reach Goals -- Barriers/Prognosis Comment -- CHL IP DIET RECOMMENDATION 10/10/2016 SLP Diet Recommendations Dysphagia 3 (Mech soft) solids;Thin liquid Liquid Administration via Cup;Straw Medication Administration Whole meds with puree Compensations Slow rate;Small sips/bites Postural Changes --   CHL IP OTHER RECOMMENDATIONS 10/10/2016 Recommended Consults -- Oral Care Recommendations Oral care BID Other Recommendations --   CHL IP FOLLOW UP RECOMMENDATIONS 10/10/2016 Follow up Recommendations (No Data)   CHL IP FREQUENCY AND DURATION 10/10/2016 Speech Therapy Frequency (ACUTE ONLY) min 1 x/week Treatment Duration 1 week      CHL IP ORAL PHASE 10/10/2016 Oral Phase Impaired Oral - Pudding Teaspoon -- Oral - Pudding Cup -- Oral - Honey Teaspoon -- Oral - Honey Cup -- Oral - Nectar Teaspoon -- Oral - Nectar Cup Premature spillage Oral - Nectar Straw -- Oral - Thin Teaspoon Premature spillage Oral - Thin Cup Premature spillage Oral - Thin Straw Premature spillage Oral - Puree Premature spillage Oral - Mech Soft -- Oral - Regular Reduced posterior propulsion Oral - Multi-Consistency -- Oral - Pill Reduced posterior propulsion;Decreased bolus cohesion;Premature spillage Oral Phase - Comment --  CHL IP PHARYNGEAL PHASE 10/10/2016 Pharyngeal Phase Impaired Pharyngeal- Pudding Teaspoon -- Pharyngeal -- Pharyngeal- Pudding Cup -- Pharyngeal -- Pharyngeal- Honey Teaspoon -- Pharyngeal -- Pharyngeal- Honey Cup -- Pharyngeal -- Pharyngeal- Nectar Teaspoon -- Pharyngeal -- Pharyngeal- Nectar Cup Lateral channel  residue Pharyngeal -- Pharyngeal- Nectar Straw NT Pharyngeal -- Pharyngeal- Thin Teaspoon Lateral channel residue Pharyngeal -- Pharyngeal- Thin Cup Delayed swallow initiation-vallecula;Lateral channel residue Pharyngeal -- Pharyngeal- Thin Straw NT;Lateral channel residue Pharyngeal -- Pharyngeal- Puree Delayed swallow initiation-vallecula Pharyngeal -- Pharyngeal- Mechanical Soft -- Pharyngeal -- Pharyngeal- Regular -- Pharyngeal -- Pharyngeal- Multi-consistency -- Pharyngeal -- Pharyngeal- Pill NT Pharyngeal -- Pharyngeal Comment --  CHL IP CERVICAL ESOPHAGEAL PHASE 10/10/2016 Cervical Esophageal Phase WFL Pudding Teaspoon -- Pudding Cup -- Honey Teaspoon -- Honey Cup -- Nectar Teaspoon -- Nectar Cup -- Nectar Straw -- Thin Teaspoon -- Thin Cup -- Thin Straw -- Puree -- Mechanical Soft -- Regular -- Multi-consistency -- Pill -- Cervical Esophageal Comment appearance of decreased clearance of esophagus without pt sensation, providing pt with water mixed  with barium faciliated clearance Luanna Salk, MS [facility] SLP [phone]                Subjective: - no chest pain, shortness of breath, no abdominal pain, nausea or vomiting.   Discharge Exam: Vitals:   10/12/16 2026 10/13/16 0508  BP: (!) 108/52 (!) 127/58  Pulse: 71 (!) 58  Resp: 17 18  Temp: 98.8 F (37.1 C) 98.2 F (36.8 C)   Vitals:   10/12/16 1341 10/12/16 2026 10/13/16 0508 10/13/16 0907  BP: 129/68 (!) 108/52 (!) 127/58   Pulse: 66 71 (!) 58   Resp: 16 17 18    Temp: 98.7 F (37.1 C) 98.8 F (37.1 C) 98.2 F (36.8 C)   TempSrc: Oral Oral Axillary   SpO2: 97% 96% 97% 95%  Weight:      Height:        General: Pt is alert, awake, not in acute distress, demented Cardiovascular: RRR, S1/S2 +, no rubs, no gallops Respiratory: CTA bilaterally, no wheezing, no rhonchi Abdominal: Soft, NT, ND, bowel sounds + Extremities: no edema, no cyanosis    The results of significant diagnostics from this hospitalization (including imaging,  microbiology, ancillary and laboratory) are listed below for reference.     Microbiology: No results found for this or any previous visit (from the past 240 hour(s)).   Labs: BNP (last 3 results) No results for input(s): BNP in the last 8760 hours. Basic Metabolic Panel:  Recent Labs Lab 10/08/16 1908 10/09/16 0542 10/10/16 0637 10/11/16 0610  NA 141 141 143 143  K 4.3 4.9 4.1 3.9  CL 106 109 109 108  CO2 25 22 24 26   GLUCOSE 88 96 108* 125*  BUN 19 22* 32* 35*  CREATININE 1.29* 1.40* 1.51* 1.26*  CALCIUM 9.1 8.6* 8.5* 8.3*  MG 1.8  --   --   --    Liver Function Tests:  Recent Labs Lab 10/08/16 1908  AST 31  ALT 23  ALKPHOS 67  BILITOT 0.9  PROT 7.1  ALBUMIN 3.3*   No results for input(s): LIPASE, AMYLASE in the last 168 hours. No results for input(s): AMMONIA in the last 168 hours. CBC:  Recent Labs Lab 10/08/16 1908 10/09/16 0542 10/10/16 0637 10/11/16 0610  WBC 10.0 7.2 15.0* 14.8*  NEUTROABS 5.4  --   --   --   HGB 13.1 12.1 12.2 12.5  HCT 39.6 37.1 36.3 37.7  MCV 87.8 87.3 86.6 88.7  PLT 313 312 293 296   Cardiac Enzymes:  Recent Labs Lab 10/08/16 1908  TROPONINI <0.03   BNP: Invalid input(s): POCBNP CBG: No results for input(s): GLUCAP in the last 168 hours. D-Dimer No results for input(s): DDIMER in the last 72 hours. Hgb A1c No results for input(s): HGBA1C in the last 72 hours. Lipid Profile No results for input(s): CHOL, HDL, LDLCALC, TRIG, CHOLHDL, LDLDIRECT in the last 72 hours. Thyroid function studies No results for input(s): TSH, T4TOTAL, T3FREE, THYROIDAB in the last 72 hours.  Invalid input(s): FREET3 Anemia work up No results for input(s): VITAMINB12, FOLATE, FERRITIN, TIBC, IRON, RETICCTPCT in the last 72 hours. Urinalysis    Component Value Date/Time   COLORURINE YELLOW 07/04/2016 1535   APPEARANCEUR HAZY (A) 07/04/2016 1535   LABSPEC 1.017 07/04/2016 1535   PHURINE 6.0 07/04/2016 1535   GLUCOSEU NEGATIVE  07/04/2016 1535   HGBUR NEGATIVE 07/04/2016 1535   BILIRUBINUR NEGATIVE 07/04/2016 1535   KETONESUR NEGATIVE 07/04/2016 1535   PROTEINUR NEGATIVE 07/04/2016 1535  UROBILINOGEN 1.0 04/23/2015 1106   NITRITE NEGATIVE 07/04/2016 1535   LEUKOCYTESUR NEGATIVE 07/04/2016 1535   Sepsis Labs Invalid input(s): PROCALCITONIN,  WBC,  LACTICIDVEN Microbiology No results found for this or any previous visit (from the past 240 hour(s)).   Time coordinating discharge: 40 minutes  SIGNED:  Marzetta Board, MD  [facility] 10/13/2016, 10:28 AM Pager [phone]  If 7PM-7AM, please contact night-coverage [email or web address] Password [email or web address]

## 2016-10-13 NOTE — Progress Notes (Signed)
[facility] called for transport. Nurse given number for report. Transport packet on chart.  Kathrin Greathouse, Latanya Presser, MSW Clinical Social Worker 5E and Psychiatric Service Line [phone] 10/13/2016  11:29 AM

## 2016-10-13 NOTE — Care Management Important Message (Signed)
Important Message  Patient Details  Name: Diksha Tagliaferro MRN: [id number] Date of Birth: 30-Jun-1922   Medicare Important Message Given:  Yes    Kerin Salen 10/13/2016, 12:13 [place] Message  Patient Details  Name: Aavya Shafer MRN: [id number] Date of Birth: Dec 30, 1921   Medicare Important Message Given:  Yes    Kerin Salen 10/13/2016, 12:13 PM

## 2016-10-14 ENCOUNTER — Ambulatory Visit: Payer: Medicare Other | Admitting: Physical Medicine & Rehabilitation

## 2016-10-14 ENCOUNTER — Encounter: Payer: Self-pay | Admitting: Internal Medicine

## 2016-10-14 ENCOUNTER — Non-Acute Institutional Stay (SKILLED_NURSING_FACILITY): Payer: Medicare Other | Admitting: Internal Medicine

## 2016-10-14 DIAGNOSIS — N183 Chronic kidney disease, stage 3 unspecified: Secondary | ICD-10-CM

## 2016-10-14 DIAGNOSIS — D72829 Elevated white blood cell count, unspecified: Secondary | ICD-10-CM | POA: Diagnosis not present

## 2016-10-14 DIAGNOSIS — F0150 Vascular dementia without behavioral disturbance: Secondary | ICD-10-CM

## 2016-10-14 DIAGNOSIS — R531 Weakness: Secondary | ICD-10-CM

## 2016-10-14 DIAGNOSIS — R1310 Dysphagia, unspecified: Secondary | ICD-10-CM

## 2016-10-14 DIAGNOSIS — Z8673 Personal history of transient ischemic attack (TIA), and cerebral infarction without residual deficits: Secondary | ICD-10-CM

## 2016-10-14 DIAGNOSIS — E440 Moderate protein-calorie malnutrition: Secondary | ICD-10-CM | POA: Diagnosis not present

## 2016-10-14 DIAGNOSIS — J690 Pneumonitis due to inhalation of food and vomit: Secondary | ICD-10-CM | POA: Diagnosis not present

## 2016-10-14 DIAGNOSIS — J4551 Severe persistent asthma with (acute) exacerbation: Secondary | ICD-10-CM | POA: Diagnosis not present

## 2016-10-14 DIAGNOSIS — B379 Candidiasis, unspecified: Secondary | ICD-10-CM | POA: Diagnosis not present

## 2016-10-14 NOTE — Progress Notes (Signed)
LOCATION: [facility]  PCP: Darlene Pac, MD   Code Status: DNR  Goals of care: Advanced Directive information Advanced Directives 10/14/2016  Does Patient Have a Medical Advance Directive? Yes  Type of Advance Directive Out of facility DNR (pink MOST or yellow form)  Does patient want to make changes to medical advance directive? No - Patient declined  Copy of [place] in Chart? -  Would patient like information on creating a medical advance directive? -       Extended Emergency Contact Information Primary Emergency Contact: Darlene, Murray Address: [address]          [place] [address] [name] Phone: [phone] Mobile Phone: [phone] Relation: Spouse Secondary Emergency Contact: Darlene,Murray  [place] Phone: [phone] Relation: Son   Allergies  Allergen Reactions  . Nsaids Other (See Comments)    Unknown. Per [facility] MAR.     Chief Complaint  Patient presents with  . Readmit To SNF    Readmission Visit      HPI:  Patient is a 83 y.o. female seen today for short term rehabilitation post hospital re-admission from 10/08/2016-10/13/2016 with acute exacerbation of asthma and aspiration pneumonia. She required antibiotic, IV steroid and nebulizer treatment. She has medical history of severe persistent asthma, chronic kidney disease stage III, CVA, dementia among others. She is seen in her room today with her son at bedside.  Review of Systems:  Constitutional: Negative for fever, chills, diaphoresis. Feels weak and tired. HENT: Negative for headache, congestion, nasal discharge, difficulty swallowing.   Eyes: Negative for double vision and discharge.  has loss of vision to right eye since childhood. Wears glasses. Respiratory: Negative for cough, shortness of breath and wheezing.   Cardiovascular: Negative for chest pain, palpitations, leg swelling.  Gastrointestinal:  Negative for heartburn, nausea, vomiting, abdominal pain, loss of appetite. Last bowel movement was the day before yesterday. At home she had a bowel movement every other day. Genitourinary: Negative for dysuria.  Musculoskeletal: Positive for right-sided weakness post stroke per family. Negative for back pain, fall in the facility.  Skin: Negative for itching, rash.  Neurological: Negative for dizziness. Psychiatric/Behavioral: Negative for depression. Positive for memory loss.   Past Medical History:  Diagnosis Date  . Acute left hemiparesis ([place])   . Allergic rhinitis   . Antibiotic-associated diarrhea   . Asthma    Moderate, persistent  . Asthmatic bronchitis   . Blindness    Legally blind, right eye  . Cough   . Fibrocystic breast disease   . HNP (herniated nucleus pulposus), lumbar    L4-L5  . Hyperlipidemia   . Left ventricular hypertrophy 2008   mild  . Physical deconditioning   . Protein calorie malnutrition ([place])   . Right sided weakness   . Right-sided lacunar infarction ([place])   . Stroke (cerebrum) ([place])   . Venous insufficiency    lower extremities   Past Surgical History:  Procedure Laterality Date  . CATARACT EXTRACTION    . COLONOSCOPY     Social History:   reports that she is a non-smoker but has been exposed to tobacco smoke. She has never used smokeless tobacco. She reports that she does not drink alcohol or use drugs.  Family History  Problem Relation Age of Onset  . Heart attack Sister   . Alzheimer's disease Sister   . Asthma Son     Medications: Allergies as of 10/14/2016  Reactions   Nsaids Other (See Comments)   Unknown. Per [facility] MAR.       Medication List       Accurate as of 10/14/16 11:07 AM. Always use your most recent med list.          acetaminophen 500 MG tablet Commonly known as:  TYLENOL Take 500 mg by mouth every 8 (eight) hours as needed (pain.).   albuterol (2.5 MG/3ML) 0.083% nebulizer solution Commonly known  as:  PROVENTIL Take 2.5 mg by nebulization every 4 (four) hours as needed for wheezing.   albuterol (2.5 MG/3ML) 0.083% nebulizer solution Commonly known as:  PROVENTIL Take 3 mLs (2.5 mg total) by nebulization 3 (three) times daily.   amoxicillin-clavulanate 875-125 MG tablet Commonly known as:  AUGMENTIN Take 1 tablet by mouth 2 (two) times daily.   arformoterol 15 MCG/2ML Nebu Commonly known as:  BROVANA Take 2 mLs (15 mcg total) by nebulization 2 (two) times daily.   atorvastatin 10 MG tablet Commonly known as:  LIPITOR Take 10 mg by mouth daily at 6 PM.   budesonide 0.5 MG/2ML nebulizer solution Commonly known as:  PULMICORT Take 2 mLs (0.5 mg total) by nebulization 2 (two) times daily.   calcium-vitamin D 500-200 MG-UNIT tablet Commonly known as:  OSCAL WITH D Take 1 tablet by mouth daily.   clopidogrel 75 MG tablet Commonly known as:  PLAVIX Take 1 tablet (75 mg total) by mouth daily.   DECUBI-VITE Caps Take 1 capsule by mouth daily.   multivitamin with minerals tablet Take 1 tablet by mouth daily.   dipyridamole-aspirin 200-25 MG 12hr capsule Commonly known as:  AGGRENOX Take 1 capsule by mouth 2 (two) times daily.   furosemide 20 MG tablet Commonly known as:  LASIX Take 20 mg by mouth daily as needed.   guaiFENesin 600 MG 12 hr tablet Commonly known as:  MUCINEX Take 600 mg by mouth every 12 (twelve) hours as needed for cough (congestion).   mirtazapine 15 MG tablet Commonly known as:  REMERON Take 1 tablet (15 mg total) by mouth at bedtime.   nystatin powder Generic drug:  nystatin Apply topically 3 (three) times daily. Apply to abdominal folds   polyethylene glycol powder powder Commonly known as:  GLYCOLAX/MIRALAX Take 17 g by mouth daily as needed (constipation.).   potassium chloride SA 20 MEQ tablet Commonly known as:  K-DUR,KLOR-CON Take 20 mEq by mouth daily.   predniSONE 5 MG tablet Commonly known as:  DELTASONE Take 1 tablet (5 mg  total) by mouth daily with breakfast.   predniSONE 10 MG tablet Commonly known as:  DELTASONE Take 1 tablet (10 mg total) by mouth daily with breakfast. 4 tablets daily x 4 days then 3 daily x 4 days then 2 daily x 4 days then 1 daily x 4 days (40)   saccharomyces boulardii 250 MG capsule Commonly known as:  FLORASTOR Take 250 mg by mouth 2 (two) times daily.       Immunizations: Immunization History  Administered Date(s) Administered  . Influenza Split 03/20/2011, 03/31/2012, 05/07/2013, 02/27/2014  . Influenza Whole 04/06/2010  . Influenza,inj,Quad PF,36+ Mos 03/27/2016  . Influenza-Unspecified 03/08/2015, 05/08/2015  . PPD Test 09/25/2015  . Pneumococcal Conjugate-13 12/08/2013  . Pneumococcal Polysaccharide-23 12/08/2009  . Tdap 12/08/2013     Physical Exam: Vitals:   10/14/16 1055  BP: (!) 108/55  Pulse: 79  Resp: 20  Temp: 98 F (36.7 C)  TempSrc: Oral  SpO2: 98%  Weight: 148 lb  4.8 oz (67.3 kg)  Height: 5\' 4"  (1.626 m)   Body mass index is 25.46 kg/m.  General- elderly female, frail, in no acute distress Head- normocephalic, atraumatic Nose- no maxillary or frontal sinus tenderness, no nasal discharge Throat- moist mucus membrane, normal oropharynx, has dentures Eyes- no pallor, no icterus, no discharge, normal conjunctiva, normal sclera Neck- no cervical lymphadenopathy Cardiovascular- normal s1,s2, no murmur Respiratory- bilateral clear to auscultation, no wheeze, no rhonchi, no crackles, no use of accessory muscles Abdomen- bowel sounds present, soft, non tender, no guarding or rigidity Musculoskeletal- able to move all 4 extremities, generalized weakness, increased muscle tone to right upper extremity and right lower extremity, decreased grip strength to left hand, trace leg edema Neurological- alert and oriented to person and place only Skin- warm and dry Psychiatry- normal mood and affect    Labs reviewed: Basic Metabolic Panel:  Recent  Labs  10/08/16 1908 10/09/16 0542 10/10/16 0637 10/11/16 0610  NA 141 141 143 143  K 4.3 4.9 4.1 3.9  CL 106 109 109 108  CO2 25 22 24 26   GLUCOSE 88 96 108* 125*  BUN 19 22* 32* 35*  CREATININE 1.29* 1.40* 1.51* 1.26*  CALCIUM 9.1 8.6* 8.5* 8.3*  MG 1.8  --   --   --    Liver Function Tests:  Recent Labs  05/05/16 1521 07/04/16 1420 07/21/16 10/08/16 1908  AST 21 31 18 31   ALT 27 32 26 23  ALKPHOS 59 57 48 67  BILITOT 0.4 0.9  --  0.9  PROT 6.9 7.2  --  7.1  ALBUMIN 3.7 3.3*  --  3.3*   No results for input(s): LIPASE, AMYLASE in the last 8760 hours. No results for input(s): AMMONIA in the last 8760 hours. CBC:  Recent Labs  05/05/16 1521 07/04/16 1420  10/08/16 1908 10/09/16 0542 10/10/16 0637 10/11/16 0610  WBC 9.1 8.8  < > 10.0 7.2 15.0* 14.8*  NEUTROABS 5.4 5.8  --  5.4  --   --   --   HGB 13.8 14.5  < > 13.1 12.1 12.2 12.5  HCT 41.5 44.0  < > 39.6 37.1 36.3 37.7  MCV 91.4 92.2  < > 87.8 87.3 86.6 88.7  PLT 268.0 214  < > 313 312 293 296  < > = values in this interval not displayed. Cardiac Enzymes:  Recent Labs  07/04/16 2011 10/08/16 1908  TROPONINI <0.03 <0.03   BNP: Invalid input(s): POCBNP CBG:  Recent Labs  07/05/16 1316  GLUCAP 142*    Radiological Exams: Dg Chest Port 1 View  Result Date: 10/11/2016 CLINICAL DATA:  Aspiration pneumonia EXAM: PORTABLE CHEST 1 VIEW COMPARISON:  Yesterday FINDINGS: Normal heart size and stable aortic tortuosity. Mild streaky density at the bases. No edema, effusion, or pneumothorax. IMPRESSION: History of aspiration pneumonia with stable mild opacification bases. Electronically Signed   By: Monte Fantasia M.D.   On: 10/11/2016 07:05   Dg Chest Port 1 View  Result Date: 10/10/2016 CLINICAL DATA:  Aspiration pneumonitis EXAM: PORTABLE CHEST 1 VIEW COMPARISON:  October 08, 2016 FINDINGS: There is subsegmental atelectasis in the left base. Lungs elsewhere are clear. Heart is upper normal in size with  pulmonary vascularity within normal limits. No adenopathy. There are foci of atherosclerotic calcification in the aorta. No bone lesions. IMPRESSION: There is slight atelectasis in the left base. No consolidation. Stable cardiac silhouette. There is aortic atherosclerosis. Electronically Signed   By: Lowella Grip III M.D.  On: 10/10/2016 07:10   Dg Chest Port 1 View  Result Date: 10/08/2016 CLINICAL DATA:  83 year old with acute onset of cough, chest congestion and generalized weakness earlier today. Current history of asthma. Personal history of stroke. EXAM: PORTABLE CHEST 1 VIEW COMPARISON:  07/04/2016, 05/05/2016 and earlier. FINDINGS: Cardiac silhouette normal in size, unchanged. Thoracic aorta tortuous and mildly atherosclerotic, unchanged. Hilar and mediastinal contours otherwise unremarkable. Mild atelectasis at the right lung base. Lungs otherwise clear. Pulmonary vascularity normal. No visible pleural effusions. IMPRESSION: 1. Mild atelectasis at the right lung base. No acute cardiopulmonary disease otherwise. 2. Thoracic aortic atherosclerosis. Electronically Signed   By: Evangeline Dakin M.D.   On: 10/08/2016 18:52   Dg Swallowing Func-speech Pathology  Result Date: 10/10/2016 Objective Swallowing Evaluation: Type of Study: MBS-Modified Barium Swallow Study Patient Details Name: Darlene Murray MRN: [id number] Date of Birth: [birth year] Today's Date: 10/10/2016 Time: SLP Start Time (ACUTE ONLY): 0815-SLP Stop Time (ACUTE ONLY): 0835 SLP Time Calculation (min) (ACUTE ONLY): 20 min Past Medical History: Past Medical History: Diagnosis Date . Acute left hemiparesis ([place])  . Allergic rhinitis  . Antibiotic-associated diarrhea  . Asthma   Moderate, persistent . Asthmatic bronchitis  . Blindness   Legally blind, right eye . Cough  . Fibrocystic breast disease  . HNP (herniated nucleus pulposus), lumbar   L4-L5 . Hyperlipidemia  . Left ventricular hypertrophy 2008  mild . Physical deconditioning  . Protein  calorie malnutrition ([place])  . Right sided weakness  . Right-sided lacunar infarction ([place])  . Stroke (cerebrum) ([place])  . Venous insufficiency   lower extremities Past Surgical History: Past Surgical History: Procedure Laterality Date . CATARACT EXTRACTION   . COLONOSCOPY   HPI: 83 yo female referred for swallow evaluation by Dr Elsworth Soho.  Pt admitted with severe asthma exacerbation.  Pt PMH + for multiple CVAs, asthma.  Pt found to have right lung base ATX 10/08/16 and concern present for aspiration.   Subjective: pt awake in chair Assessment / Plan / Recommendation CHL IP CLINICAL IMPRESSIONS 10/10/2016 Clinical Impression Pt with mild oropharyngeal dysphagia resulting in premature spillage of boluses into pharynx.  Due to discoordination, pt unable to orally transit barium tablet with thin nor pudding and expectorated it per SLP cue.   Pt separated barium tablet from pudding and thin.   Pt with intermittent delay in pharyngeal swallow reflex - boluses retained in pharynx with pt stating "I swallowed".  ? some cognitive based swallowing deficits.  Cues required to swallow after approx 40 seconds of boluses retained.  No aspiration/penetration. Upon esophageal sweep, pt with appearance of residuals from distal to mid-esophagus without sensation.  Consumption of liquids faciliated clearance.  Of note, pt did NOT cough during MBS but she continued with watery eyes.  Using live video, educated pt to findings/recommendations.   SLP Visit Diagnosis Dysphagia, oropharyngeal phase (R13.12) Attention and concentration deficit following -- Frontal lobe and executive function deficit following -- Impact on safety and function Mild aspiration risk   CHL IP TREATMENT RECOMMENDATION 10/10/2016 Treatment Recommendations Therapy as outlined in treatment plan below   Prognosis 10/10/2016 Prognosis for Safe Diet Advancement Good Barriers to Reach Goals -- Barriers/Prognosis Comment -- CHL IP DIET RECOMMENDATION 10/10/2016 SLP Diet Recommendations  Dysphagia 3 (Mech soft) solids;Thin liquid Liquid Administration via Cup;Straw Medication Administration Whole meds with puree Compensations Slow rate;Small sips/bites Postural Changes --   CHL IP OTHER RECOMMENDATIONS 10/10/2016 Recommended Consults -- Oral Care Recommendations Oral care BID Other Recommendations --   CHL IP  FOLLOW UP RECOMMENDATIONS 10/10/2016 Follow up Recommendations (No Data)   CHL IP FREQUENCY AND DURATION 10/10/2016 Speech Therapy Frequency (ACUTE ONLY) min 1 x/week Treatment Duration 1 week      CHL IP ORAL PHASE 10/10/2016 Oral Phase Impaired Oral - Pudding Teaspoon -- Oral - Pudding Cup -- Oral - Honey Teaspoon -- Oral - Honey Cup -- Oral - Nectar Teaspoon -- Oral - Nectar Cup Premature spillage Oral - Nectar Straw -- Oral - Thin Teaspoon Premature spillage Oral - Thin Cup Premature spillage Oral - Thin Straw Premature spillage Oral - Puree Premature spillage Oral - Mech Soft -- Oral - Regular Reduced posterior propulsion Oral - Multi-Consistency -- Oral - Pill Reduced posterior propulsion;Decreased bolus cohesion;Premature spillage Oral Phase - Comment --  CHL IP PHARYNGEAL PHASE 10/10/2016 Pharyngeal Phase Impaired Pharyngeal- Pudding Teaspoon -- Pharyngeal -- Pharyngeal- Pudding Cup -- Pharyngeal -- Pharyngeal- Honey Teaspoon -- Pharyngeal -- Pharyngeal- Honey Cup -- Pharyngeal -- Pharyngeal- Nectar Teaspoon -- Pharyngeal -- Pharyngeal- Nectar Cup Lateral channel residue Pharyngeal -- Pharyngeal- Nectar Straw NT Pharyngeal -- Pharyngeal- Thin Teaspoon Lateral channel residue Pharyngeal -- Pharyngeal- Thin Cup Delayed swallow initiation-vallecula;Lateral channel residue Pharyngeal -- Pharyngeal- Thin Straw NT;Lateral channel residue Pharyngeal -- Pharyngeal- Puree Delayed swallow initiation-vallecula Pharyngeal -- Pharyngeal- Mechanical Soft -- Pharyngeal -- Pharyngeal- Regular -- Pharyngeal -- Pharyngeal- Multi-consistency -- Pharyngeal -- Pharyngeal- Pill NT Pharyngeal -- Pharyngeal Comment --   CHL IP CERVICAL ESOPHAGEAL PHASE 10/10/2016 Cervical Esophageal Phase WFL Pudding Teaspoon -- Pudding Cup -- Honey Teaspoon -- Honey Cup -- Nectar Teaspoon -- Nectar Cup -- Nectar Straw -- Thin Teaspoon -- Thin Cup -- Thin Straw -- Puree -- Mechanical Soft -- Regular -- Multi-consistency -- Pill -- Cervical Esophageal Comment appearance of decreased clearance of esophagus without pt sensation, providing pt with water mixed with barium faciliated clearance Darlene Salk, MS [facility] SLP [phone]               Assessment/Plan  Generalized weakness From physical deconditioning.Will have her work with physical therapy and occupational therapy team to help with gait training and muscle strengthening exercises.fall precautions. Skin care. Encourage to be out of bed.   Acute exacerbation of asthma Status post IV antibiotic, IV steroids and nebulizer treatment. Continue and complete tapering course of prednisone on 10/29/2016 followed by chronic maintenance of prednisone 5 mg daily. Will need follow-up with pulmonology. Monitor her breathing status. Continue Brovana nebulizer twice a day, budesonide bid, albuterol tid and albuterol nebulizer on a needed basis.  Aspiration pneumonia Aspiration precautions to be taken. SLP consult. Continue and complete course of Augmentin on 10/18/2016. Continue Mucinex as needed for cough.  Dysphagia Continue mechanical soft solids and thin liquid. Aspiration precaution. SLP consult.  Leukocytosis Being treated for aspiration pneumonia and asthma exacerbation. Pneumonia and being on prednisone could fourth be contribution to her elevated white blood cell count. Monitor WBC curve.  Constipation Currently on MiraLAX daily as needed. Change this to once a day and monitor.  Yeast infection to abdominal folds, continue nystatin powder every shift until healed with skin care.  Protein calorie malnutrition Continue multivitamin supplements. Continue Remeron 15 mg daily to help  stimulate her appetite. Monitor oral intake. Monitor weekly wait for now.  History of CVA With right sided weakness, unsteady gait, dementia and dysphagia. Continue Aggrenox twice a day, Plavix daily and atorvastatin 10 mg daily. PMR consult to evaluate for increased tone to right side  ckd stage 3 Monitor BMP  Dementia without behavioral disturbance With vascular  component with hx of CVA, supportive care for now.   Goals of care: short term rehabilitation, possible long-term care   Labs/tests ordered: CBC, CMP 10/20/2016  Family/ staff Communication: reviewed care plan with patient, her son at bedside and her husband over the phone and nursing supervisor  I have spent greater than 50 minutes for this encounter which includes reviewing hospital records, addressing above mentioned concerns, reviewing care plan with patient and her family, answering patient's and family's concerns and counseling.     Blanchie Serve, MD Internal Medicine [facility] [address] [place], [place] [address] Cell Phone (Monday-Friday 8 am - 5 pm): [phone] On Call: [phone] and follow prompts after 5 pm and on weekends Office Phone: [phone] Office Fax: [phone]

## 2016-10-22 DIAGNOSIS — M6281 Muscle weakness (generalized): Secondary | ICD-10-CM | POA: Diagnosis not present

## 2016-10-22 DIAGNOSIS — Z5189 Encounter for other specified aftercare: Secondary | ICD-10-CM | POA: Diagnosis not present

## 2016-10-22 DIAGNOSIS — R2681 Unsteadiness on feet: Secondary | ICD-10-CM | POA: Diagnosis not present

## 2016-10-22 DIAGNOSIS — R5381 Other malaise: Secondary | ICD-10-CM | POA: Diagnosis not present

## 2016-10-22 DIAGNOSIS — R102 Pelvic and perineal pain: Secondary | ICD-10-CM | POA: Diagnosis not present

## 2016-10-22 DIAGNOSIS — F0390 Unspecified dementia without behavioral disturbance: Secondary | ICD-10-CM | POA: Diagnosis not present

## 2016-10-22 DIAGNOSIS — L8943 Pressure ulcer of contiguous site of back, buttock and hip, stage 3: Secondary | ICD-10-CM | POA: Diagnosis not present

## 2016-10-24 ENCOUNTER — Encounter: Payer: Medicare Other | Attending: Physical Medicine & Rehabilitation

## 2016-10-24 ENCOUNTER — Ambulatory Visit: Payer: Medicare Other | Admitting: Physical Medicine & Rehabilitation

## 2016-10-24 DIAGNOSIS — L89153 Pressure ulcer of sacral region, stage 3: Secondary | ICD-10-CM | POA: Diagnosis not present

## 2016-10-24 DIAGNOSIS — N183 Chronic kidney disease, stage 3 (moderate): Secondary | ICD-10-CM | POA: Diagnosis not present

## 2016-10-24 DIAGNOSIS — F0390 Unspecified dementia without behavioral disturbance: Secondary | ICD-10-CM | POA: Diagnosis not present

## 2016-10-24 DIAGNOSIS — R102 Pelvic and perineal pain: Secondary | ICD-10-CM | POA: Diagnosis not present

## 2016-10-24 DIAGNOSIS — R5381 Other malaise: Secondary | ICD-10-CM | POA: Diagnosis not present

## 2016-10-24 DIAGNOSIS — R2681 Unsteadiness on feet: Secondary | ICD-10-CM | POA: Diagnosis not present

## 2016-10-24 DIAGNOSIS — M6281 Muscle weakness (generalized): Secondary | ICD-10-CM | POA: Diagnosis not present

## 2016-10-24 DIAGNOSIS — Z5189 Encounter for other specified aftercare: Secondary | ICD-10-CM | POA: Diagnosis not present

## 2016-10-24 DIAGNOSIS — L8943 Pressure ulcer of contiguous site of back, buttock and hip, stage 3: Secondary | ICD-10-CM | POA: Diagnosis not present

## 2016-10-24 DIAGNOSIS — R1312 Dysphagia, oropharyngeal phase: Secondary | ICD-10-CM | POA: Diagnosis not present

## 2016-10-25 ENCOUNTER — Encounter (HOSPITAL_COMMUNITY): Payer: Self-pay | Admitting: Emergency Medicine

## 2016-10-25 ENCOUNTER — Emergency Department (HOSPITAL_COMMUNITY): Payer: Medicare Other

## 2016-10-25 ENCOUNTER — Inpatient Hospital Stay (HOSPITAL_COMMUNITY)
Admission: EM | Admit: 2016-10-25 | Discharge: 2016-11-01 | DRG: 193 | Disposition: A | Discharge status: Hospice | Payer: Medicare Other | Attending: Internal Medicine | Admitting: Internal Medicine

## 2016-10-25 DIAGNOSIS — R4182 Altered mental status, unspecified: Secondary | ICD-10-CM | POA: Diagnosis not present

## 2016-10-25 DIAGNOSIS — N183 Chronic kidney disease, stage 3 unspecified: Secondary | ICD-10-CM | POA: Diagnosis present

## 2016-10-25 DIAGNOSIS — E43 Unspecified severe protein-calorie malnutrition: Secondary | ICD-10-CM | POA: Diagnosis present

## 2016-10-25 DIAGNOSIS — L89152 Pressure ulcer of sacral region, stage 2: Secondary | ICD-10-CM | POA: Diagnosis present

## 2016-10-25 DIAGNOSIS — R531 Weakness: Secondary | ICD-10-CM

## 2016-10-25 DIAGNOSIS — G9340 Encephalopathy, unspecified: Secondary | ICD-10-CM | POA: Diagnosis present

## 2016-10-25 DIAGNOSIS — F0390 Unspecified dementia without behavioral disturbance: Secondary | ICD-10-CM | POA: Diagnosis present

## 2016-10-25 DIAGNOSIS — R627 Adult failure to thrive: Secondary | ICD-10-CM | POA: Diagnosis present

## 2016-10-25 DIAGNOSIS — I69319 Unspecified symptoms and signs involving cognitive functions following cerebral infarction: Secondary | ICD-10-CM

## 2016-10-25 DIAGNOSIS — J189 Pneumonia, unspecified organism: Principal | ICD-10-CM

## 2016-10-25 DIAGNOSIS — R400 Somnolence: Secondary | ICD-10-CM | POA: Diagnosis not present

## 2016-10-25 DIAGNOSIS — I639 Cerebral infarction, unspecified: Secondary | ICD-10-CM | POA: Diagnosis present

## 2016-10-25 DIAGNOSIS — J309 Allergic rhinitis, unspecified: Secondary | ICD-10-CM | POA: Diagnosis present

## 2016-10-25 DIAGNOSIS — I69993 Ataxia following unspecified cerebrovascular disease: Secondary | ICD-10-CM

## 2016-10-25 DIAGNOSIS — E785 Hyperlipidemia, unspecified: Secondary | ICD-10-CM | POA: Diagnosis present

## 2016-10-25 DIAGNOSIS — I69393 Ataxia following cerebral infarction: Secondary | ICD-10-CM

## 2016-10-25 DIAGNOSIS — Y95 Nosocomial condition: Secondary | ICD-10-CM | POA: Diagnosis present

## 2016-10-25 DIAGNOSIS — E876 Hypokalemia: Secondary | ICD-10-CM | POA: Diagnosis not present

## 2016-10-25 DIAGNOSIS — G8194 Hemiplegia, unspecified affecting left nondominant side: Secondary | ICD-10-CM | POA: Diagnosis present

## 2016-10-25 DIAGNOSIS — L8990 Pressure ulcer of unspecified site, unspecified stage: Secondary | ICD-10-CM | POA: Diagnosis present

## 2016-10-25 DIAGNOSIS — J45909 Unspecified asthma, uncomplicated: Secondary | ICD-10-CM | POA: Diagnosis present

## 2016-10-25 DIAGNOSIS — Z8673 Personal history of transient ischemic attack (TIA), and cerebral infarction without residual deficits: Secondary | ICD-10-CM

## 2016-10-25 DIAGNOSIS — Z515 Encounter for palliative care: Secondary | ICD-10-CM

## 2016-10-25 DIAGNOSIS — Z79899 Other long term (current) drug therapy: Secondary | ICD-10-CM

## 2016-10-25 DIAGNOSIS — Z66 Do not resuscitate: Secondary | ICD-10-CM

## 2016-10-25 DIAGNOSIS — Z7902 Long term (current) use of antithrombotics/antiplatelets: Secondary | ICD-10-CM

## 2016-10-25 DIAGNOSIS — Z6826 Body mass index (BMI) 26.0-26.9, adult: Secondary | ICD-10-CM

## 2016-10-25 DIAGNOSIS — I872 Venous insufficiency (chronic) (peripheral): Secondary | ICD-10-CM | POA: Diagnosis present

## 2016-10-25 DIAGNOSIS — J9811 Atelectasis: Secondary | ICD-10-CM | POA: Diagnosis not present

## 2016-10-25 DIAGNOSIS — Z993 Dependence on wheelchair: Secondary | ICD-10-CM

## 2016-10-25 DIAGNOSIS — F03B0 Unspecified dementia, moderate, without behavioral disturbance, psychotic disturbance, mood disturbance, and anxiety: Secondary | ICD-10-CM | POA: Diagnosis present

## 2016-10-25 DIAGNOSIS — I6930 Unspecified sequelae of cerebral infarction: Secondary | ICD-10-CM

## 2016-10-25 DIAGNOSIS — H5461 Unqualified visual loss, right eye, normal vision left eye: Secondary | ICD-10-CM | POA: Diagnosis present

## 2016-10-25 DIAGNOSIS — L89312 Pressure ulcer of right buttock, stage 2: Secondary | ICD-10-CM

## 2016-10-25 LAB — COMPREHENSIVE METABOLIC PANEL
ALBUMIN: 2.3 g/dL — AB (ref 3.5–5.0)
ALK PHOS: 62 U/L (ref 38–126)
ALT: 17 U/L (ref 14–54)
AST: 21 U/L (ref 15–41)
Anion gap: 9 (ref 5–15)
BUN: 11 mg/dL (ref 6–20)
CO2: 22 mmol/L (ref 22–32)
CREATININE: 0.99 mg/dL (ref 0.44–1.00)
Calcium: 8.4 mg/dL — ABNORMAL LOW (ref 8.9–10.3)
Chloride: 109 mmol/L (ref 101–111)
GFR calc non Af Amer: 48 mL/min — ABNORMAL LOW (ref >60)
GFR, EST AFRICAN AMERICAN: 56 mL/min — AB (ref >60)
GLUCOSE: 103 mg/dL — AB (ref 65–99)
Potassium: 3.7 mmol/L (ref 3.5–5.1)
SODIUM: 140 mmol/L (ref 135–145)
Total Bilirubin: 0.9 mg/dL (ref 0.3–1.2)
Total Protein: 5.9 g/dL — ABNORMAL LOW (ref 6.5–8.1)

## 2016-10-25 LAB — URINALYSIS, ROUTINE W REFLEX MICROSCOPIC
BACTERIA UA: NONE SEEN
Glucose, UA: NEGATIVE mg/dL
Hgb urine dipstick: NEGATIVE
Ketones, ur: NEGATIVE mg/dL
Leukocytes, UA: NEGATIVE
NITRITE: NEGATIVE
Protein, ur: 30 mg/dL — AB
SPECIFIC GRAVITY, URINE: 1.021 (ref 1.005–1.030)
Squamous Epithelial / HPF: NONE SEEN
pH: 5 (ref 5.0–8.0)

## 2016-10-25 LAB — CBC
HCT: 34.4 % — ABNORMAL LOW (ref 36.0–46.0)
Hemoglobin: 11.5 g/dL — ABNORMAL LOW (ref 12.0–15.0)
MCH: 29.2 pg (ref 26.0–34.0)
MCHC: 33.4 g/dL (ref 30.0–36.0)
MCV: 87.3 fL (ref 78.0–100.0)
PLATELETS: 225 10*3/uL (ref 150–400)
RBC: 3.94 MIL/uL (ref 3.87–5.11)
RDW: 15.8 % — AB (ref 11.5–15.5)
WBC: 11.7 10*3/uL — AB (ref 4.0–10.5)

## 2016-10-25 LAB — CBG MONITORING, ED: Glucose-Capillary: 97 mg/dL (ref 65–99)

## 2016-10-25 MED ORDER — DEXTROSE 5 % IV SOLN
1.0000 g | Freq: Once | INTRAVENOUS | Status: AC
  Administered 2016-10-25: 1 g via INTRAVENOUS
  Filled 2016-10-25: qty 10

## 2016-10-25 MED ORDER — DEXTROSE 5 % IV SOLN
500.0000 mg | Freq: Once | INTRAVENOUS | Status: AC
  Administered 2016-10-25: 500 mg via INTRAVENOUS
  Filled 2016-10-25: qty 500

## 2016-10-25 MED ORDER — SODIUM CHLORIDE 0.9 % IV BOLUS (SEPSIS)
1000.0000 mL | Freq: Once | INTRAVENOUS | Status: AC
  Administered 2016-10-25: 1000 mL via INTRAVENOUS

## 2016-10-25 MED ORDER — DEXTROSE-NACL 5-0.45 % IV SOLN
INTRAVENOUS | Status: DC
  Administered 2016-10-26 – 2016-10-28 (×2): via INTRAVENOUS

## 2016-10-25 NOTE — H&P (Signed)
History and Physical    Darlene Murray KXF:[id number] DOB: 1922/03/06 DOA: 10/25/2016  PCP: Gennette Pac, MD   Patient coming from: SNF  Chief Complaint: Altered mental status  HPI: Darlene Murray is a 83 y.o. woman with a history of multiple prior strokes, blindness in her right eye, CKD 3, severe asthma, moderate to severe protein calorie malnutrition, and dementia who is now a SNF resident.  She was transferred to the ED today for evaluation of altered mental status, increased lethargy, generalized weakness.  Her husband and son are at bedside and provide clinical history.  Around noon today, she was found to more lethargic during lunch.  She would not open her eyes and eat.  She asked to be put in the bed around 2PM.  By dinner, her family feels that she was barely arousable and requested transfer.  No documented fever.  Family denies overt signs of aspiration with meals, though they admit that her PO intake has been very poor.  She has not indicated pain in her chest, abdomen, or back.  No complaints of headache.  She is essentially wheelchair bound, and she has pressure ulcers on her sacrum and just below her buttock.  She had a recent admission to [facility] for severe asthma attack and possible aspiration pneumonia.  ED Course: WBCcount 11.5.  Chest xray shows a LLL opacity (today's chest xray compared to one done earlier this month; reviewed by me; infiltrate appears to be acute).  Lactic acid level 0.7.  Head CT negative for acute process.  She has received Rocephin and azithromycin in the ED.  Blood cultures drawn.  She received 1L of NS.  Hospitalist asked to admit.  Review of Systems: Unable to obtain due to altered mental status.   Past Medical History:  Diagnosis Date  . Acute left hemiparesis ([place])   . Allergic rhinitis   . Antibiotic-associated diarrhea   . Asthma    Moderate, persistent  . Asthmatic bronchitis   . Blindness    Legally blind, right eye  . Cough   .  Fibrocystic breast disease   . HNP (herniated nucleus pulposus), lumbar    L4-L5  . Hyperlipidemia   . Left ventricular hypertrophy 2008   mild  . Physical deconditioning   . Protein calorie malnutrition ([place])   . Right sided weakness   . Right-sided lacunar infarction ([place])   . Stroke (cerebrum) ([place])   . Venous insufficiency    lower extremities    Past Surgical History:  Procedure Laterality Date  . CATARACT EXTRACTION    . COLONOSCOPY       reports that she is a non-smoker but has been exposed to tobacco smoke. She has never used smokeless tobacco. She reports that she does not drink alcohol or use drugs.  She is married.  Allergies  Allergen Reactions  . Nsaids Other (See Comments)    Unknown reaction - listed on MAR    Family History  Problem Relation Age of Onset  . Heart attack Sister   . Alzheimer's disease Sister   . Asthma Son      Prior to Admission medications   Medication Sig Start Date End Date Taking? Authorizing Provider  acetaminophen (TYLENOL) 500 MG tablet Take 500 mg by mouth every 8 (eight) hours as needed (pain.).    Yes Historical Provider, MD  albuterol (PROVENTIL) (2.5 MG/3ML) 0.083% nebulizer solution Take 3 mLs (2.5 mg total) by nebulization 3 (three) times daily. Patient taking differently: Take 2.5 mg  by nebulization See admin instructions. Inhale 1 vial (2.5 mg) via nebulization 3 times daily for COPD, may also inhale 1 vial every 4 hours as needed for wheezing 10/13/16 10/27/16 Yes Costin Karlyne Greenspan, MD  arformoterol (BROVANA) 15 MCG/2ML NEBU Take 2 mLs (15 mcg total) by nebulization 2 (two) times daily. 10/13/16  Yes Costin Karlyne Greenspan, MD  atorvastatin (LIPITOR) 10 MG tablet Take 10 mg by mouth every evening.    Yes Historical Provider, MD  budesonide (PULMICORT) 0.5 MG/2ML nebulizer solution Take 2 mLs (0.5 mg total) by nebulization 2 (two) times daily. 10/13/16  Yes Costin Karlyne Greenspan, MD  calcium-vitamin D (OSCAL WITH D) 500-200 MG-UNIT tablet  Take 1 tablet by mouth daily.   Yes Historical Provider, MD  clopidogrel (PLAVIX) 75 MG tablet Take 1 tablet (75 mg total) by mouth daily. Patient taking differently: Take 75 mg by mouth every evening.  06/13/15  Yes Marcial Pacas, MD  collagenase (SANTYL) ointment Apply 1 application topically See admin instructions. Apply daily to sacral unstageable pressure ulcer   Yes Historical Provider, MD  dipyridamole-aspirin (AGGRENOX) 200-25 MG 12hr capsule Take 1 capsule by mouth 2 (two) times daily. 12/27/15  Yes Marcial Pacas, MD  furosemide (LASIX) 20 MG tablet Take 20 mg by mouth daily as needed for fluid or edema (weight gain of 3 lbs in 24 hours or 5 lbs in a week).    Yes Historical Provider, MD  guaiFENesin (MUCINEX) 600 MG 12 hr tablet Take 600 mg by mouth every 12 (twelve) hours as needed for cough (congestion).    Yes Historical Provider, MD  mirtazapine (REMERON) 15 MG tablet Take 1 tablet (15 mg total) by mouth at bedtime. 10/02/16  Yes Marcial Pacas, MD  Multiple Vitamins-Minerals (DECUBI-VITE) CAPS Take 1 capsule by mouth daily.   Yes Historical Provider, MD  Multiple Vitamins-Minerals (MULTIVITAMIN WITH MINERALS) tablet Take 1 tablet by mouth daily.   Yes Historical Provider, MD  Nutritional Supplements (NUTRITIONAL SUPPLEMENT PO) Take 120 mLs by mouth daily. MedPass   Yes Historical Provider, MD  Nutritional Supplements (NUTRITIONAL SUPPLEMENT PO) Take 1 each by mouth daily. Magic Cup   Yes Historical Provider, MD  nystatin (MYCOSTATIN) 100000 UNIT/ML suspension Take 5 mLs by mouth See admin instructions. Order date 10/24/26: Swish and swallow 5 mls 4 times daily for 3 weeks for oral thrush   Yes Historical Provider, MD  nystatin (NYSTATIN) powder Apply topically See admin instructions. Apply topical to abdominal folds every shift until healed   Yes Historical Provider, MD  polyethylene glycol powder (GLYCOLAX/MIRALAX) powder Take 17 g by mouth daily. Mix with 6-8 ounces of fluid and drink   Yes Historical  Provider, MD  potassium chloride SA (K-DUR,KLOR-CON) 20 MEQ tablet Take 20 mEq by mouth daily.   Yes Historical Provider, MD  predniSONE (DELTASONE) 10 MG tablet Take 1 tablet (10 mg total) by mouth daily with breakfast. 4 tablets daily x 4 days then 3 daily x 4 days then 2 daily x 4 days then 1 daily x 4 days (40) Patient taking differently: Take 10-40 mg by mouth daily with breakfast. Tapered course started 10/13/16: Take 4 tablets (40 mg) daily for 4 days, then take 3 tablets (30 mg) daily for 4 days, then take  2 tablets (20 mg) daily for 4 days, then take  1 tablet (10 mg) daily for 4 days, then resume 5 mg daily. 10/13/16  Yes Costin Karlyne Greenspan, MD  saccharomyces boulardii (FLORASTOR) 250 MG capsule Take 250 mg  by mouth 2 (two) times daily.   Yes Historical Provider, MD  zinc oxide 20 % ointment Apply 1 application topically See admin instructions. Apply to sacral crease at each brief change   Yes Historical Provider, MD  predniSONE (DELTASONE) 5 MG tablet Take 1 tablet (5 mg total) by mouth daily with breakfast. 07/14/16   Rigoberto Noel, MD    Physical Exam: Vitals:   10/25/16 2245 10/25/16 2300 10/25/16 2315 10/25/16 2330  BP: (!) 101/50 (!) 96/47 (!) 98/44 (!) 93/42  Pulse: 85 83 81 79  Resp: 17 18 17 17   Temp:      TempSrc:      SpO2: 97% 96% 97% 97%      Constitutional: NAD, calm, arouses to sternal rub but no meaningful interaction Vitals:   10/25/16 2245 10/25/16 2300 10/25/16 2315 10/25/16 2330  BP: (!) 101/50 (!) 96/47 (!) 98/44 (!) 93/42  Pulse: 85 83 81 79  Resp: 17 18 17 17   Temp:      TempSrc:      SpO2: 97% 96% 97% 97%   Eyes: pupils are pinpoint bilaterally and sluggish, lids and conjunctivae normal ENMT: Mucous membranes are dry. Posterior pharynx clear not completely visualized.  Appears to be wearing dentures. Neck: normal appearance, supple, no masses Respiratory: clear to auscultation listening anteriorly.  No wheezing, no crackles. Normal respiratory effort. No  accessory muscle use.  Cardiovascular: Normal rate, regular rhythm, no murmurs / rubs / gallops. 2+ pitting edema in bilateral lower extremities.  2+ pedal pulses. GI: abdomen is soft and compressible.  No distention.  No apparent tenderness.  Bowel sounds are present. Musculoskeletal:  No joint deformity in upper and lower extremities. No contractures. Arms are flaccid. Skin: no rashes, warm and dry Neurologic: Unable to cooperate with exam due to altered mental status. Psychiatric: Obtunded.  No insight.    Labs on Admission: I have personally reviewed following labs and imaging studies  CBC:  Recent Labs Lab 10/25/16 2020  WBC 11.7*  HGB 11.5*  HCT 34.4*  MCV 87.3  PLT 502   Basic Metabolic Panel:  Recent Labs Lab 10/25/16 2020  NA 140  K 3.7  CL 109  CO2 22  GLUCOSE 103*  BUN 11  CREATININE 0.99  CALCIUM 8.4*   GFR: Estimated Creatinine Clearance: 34.2 mL/min (by C-G formula based on SCr of 0.99 mg/dL). Liver Function Tests:  Recent Labs Lab 10/25/16 2020  AST 21  ALT 17  ALKPHOS 62  BILITOT 0.9  PROT 5.9*  ALBUMIN 2.3*   BNP (last 3 results)  Recent Labs  05/05/16 1521  PROBNP 16.0   CBG:  Recent Labs Lab 10/25/16 1927  GLUCAP 97   Urine analysis:    Component Value Date/Time   COLORURINE AMBER (A) 10/25/2016 2104   APPEARANCEUR CLOUDY (A) 10/25/2016 2104   LABSPEC 1.021 10/25/2016 2104   PHURINE 5.0 10/25/2016 2104   GLUCOSEU NEGATIVE 10/25/2016 2104   HGBUR NEGATIVE 10/25/2016 2104   BILIRUBINUR SMALL (A) 10/25/2016 2104   [place] NEGATIVE 10/25/2016 2104   PROTEINUR 30 (A) 10/25/2016 2104   UROBILINOGEN 1.0 04/23/2015 1106   NITRITE NEGATIVE 10/25/2016 2104   LEUKOCYTESUR NEGATIVE 10/25/2016 2104    Radiological Exams on Admission: Ct Head Wo Contrast  Result Date: 10/25/2016 CLINICAL DATA:  Altered mental status.  Baseline dementia. EXAM: CT HEAD WITHOUT CONTRAST TECHNIQUE: Contiguous axial images were obtained from the  base of the skull through the vertex without intravenous contrast. COMPARISON:  Head  CT dated 09/11/2015 and brain MR dated 09/11/2015. FINDINGS: Brain: Diffusely enlarged ventricles and subarachnoid spaces. Patchy white matter low density in both cerebral hemispheres. Old left alignment lacunar infarct, unchanged. Stable old bilateral cerebellar hemisphere infarcts. No intracranial hemorrhage, mass lesion or CT evidence of acute infarction. Vascular: No hyperdense vessel or unexpected calcification. Skull: Normal. Negative for fracture or focal lesion. Sinuses/Orbits: Almost completely opacified sphenoid sinus with mild improvement. Bilateral globe post lens replacement changes. Other: None. IMPRESSION: 1. No acute abnormality. 2. Stable atrophy, chronic small vessel white matter ischemic changes and old infarcts. 3. Longstanding chronic right sphenoid sinusitis with mild improvement. Electronically Signed   By: Claudie Revering M.D.   On: 10/25/2016 20:52   Dg Chest Portable 1 View  Result Date: 10/25/2016 CLINICAL DATA:  Altered mental status EXAM: PORTABLE CHEST 1 VIEW COMPARISON:  10/11/2016, 10/10/2016 FINDINGS: Low lung volumes. Vague opacity at the left lung base. Elevated left diaphragm with probable air filled bowel in the left upper quadrant. Stable cardiomediastinal silhouette with tortuous unfolded aorta. No pneumothorax. IMPRESSION: 1. Poorly defined opacity at the left lung base, could reflect atelectasis or an infiltrate. Electronically Signed   By: Donavan Foil M.D.   On: 10/25/2016 20:00    EKG: Independently reviewed. NSR.  RBBB is not new.  Assessment/Plan Principal Problem:   Acute encephalopathy Active Problems:   History of CVA with residual deficit   Stage 3 chronic kidney disease   Pressure injury of skin   HCAP (healthcare-associated pneumonia)   Adult failure to thrive      Acute encephalopathy attributed to HCAP at this point.  Recurrent CVA is certainly on the  differential as well. --Change antibiotic coverage to cefepime and vancomycin for HCAP --Blood cultures --Cautious hydration --Consider MRI if she does not respond to initial therapy --Aspiration precautions, fall precautions  Stage II pressure ulcers, present on admission --Wound care consult in the AM  CVA --Plavix, statin  HLD --Statin  CKD 3 --Creatinine appears to be at baseline --Avoid nephrotoxic agents     DVT prophylaxis: SCDs Code Status: DNR/DNI Family Communication: Husband and son present in the ED at time of admission. Disposition Plan: Expect she will go back to SNF when ready. Consults called: NONE Admission status: Place in observation, stepdown unit due to degree of encephalopathy.   TIME SPENT: 60 minutes   Eber Jones MD [facility] Pager [phone]  If 7PM-7AM, please contact night-coverage [email or web address] Password [facility]  10/25/2016, 11:52 PM

## 2016-10-25 NOTE — ED Triage Notes (Signed)
Pt arrives [facility] EMS from [facility]. Per report family stated the patient was experiencing altered mental status. Patient has baseline dementia. At facility patient was hard to arouse. In triage patient is responsive to voice and A+O x 3

## 2016-10-25 NOTE — ED Notes (Signed)
Delay in lab draw,  edp at bedside. 

## 2016-10-25 NOTE — ED Provider Notes (Signed)
[facility] DEPT Provider Note   CSN: [id number] Arrival date & time: 10/25/16  1906     History   Chief Complaint Chief Complaint  Patient presents with  . Altered Mental Status    HPI Darlene Murray is a 83 y.o. female.  The history is provided by the patient, the EMS personnel, a relative and medical records.  Altered Mental Status   This is a new problem. Episode onset: Today. The problem has been gradually improving. Associated symptoms include somnolence and unresponsiveness. Risk factors: nursing home resident. Past medical history comments: dementia, multiple CVAs.    Past Medical History:  Diagnosis Date  . Acute left hemiparesis ([place])   . Allergic rhinitis   . Antibiotic-associated diarrhea   . Asthma    Moderate, persistent  . Asthmatic bronchitis   . Blindness    Legally blind, right eye  . Cough   . Fibrocystic breast disease   . HNP (herniated nucleus pulposus), lumbar    L4-L5  . Hyperlipidemia   . Left ventricular hypertrophy 2008   mild  . Physical deconditioning   . Protein calorie malnutrition ([place])   . Right sided weakness   . Right-sided lacunar infarction ([place])   . Stroke (cerebrum) ([place])   . Venous insufficiency    lower extremities    Patient Active Problem List   Diagnosis Date Noted  . Pressure injury of skin 10/09/2016  . Severe asthma with acute exacerbation 10/08/2016  . Polypharmacy 10/02/2016  . Acute respiratory failure with hypoxia ([place]) 07/07/2016  . Influenza with respiratory manifestation 07/07/2016  . Moderate dementia without behavioral disturbance 07/07/2016  . Severe persistent asthma with exacerbation   . COPD exacerbation ([place])   . Stage 3 chronic kidney disease   . History of stroke   . Dyspnea, unspecified 05/05/2016  . Residual cognitive deficit as late effect of stroke 04/15/2016  . Neuropathic pain of shoulder 11/29/2015  . Decreased GFR   . Asthma, severe persistent   . Thalamic infarct, acute ([place])  09/13/2015  . Right-sided lacunar infarction ([place])   . History of CVA with residual deficit   . Asthma, moderate persistent   . Left-sided weakness 09/11/2015  . CVA (cerebral vascular accident) ([place])   . Acromioclavicular joint arthritis 08/20/2015  . Acute prerenal azotemia 08/02/2015  . CKD (chronic kidney disease), stage III 08/02/2015  . Memory loss 06/15/2015  . Spondylosis, cervical, with myelopathy 06/13/2015  . Ataxia S/P CVA 05/17/2015  . Gait disturbance, post-stroke 05/17/2015  . Urinary tract infection 04/23/2015  . Acute hemorrhagic infarction of brain ([place]) 04/10/2015  . Ataxia   . History of mixed hemorraghic embolic stroke 40/02/6760  . Weakness 04/06/2015  . Generalized weakness 04/06/2015  . Nausea with vomiting 04/06/2015  . Left ventricular diastolic dysfunction with preserved systolic function 95/03/3266  . Severe persistent asthma in adult steroid dependent   . Allergic rhinitis   . Hyperlipidemia   . Venous insufficiency   . HNP (herniated nucleus pulposus), lumbar   . Left ventricular hypertrophy     Past Surgical History:  Procedure Laterality Date  . CATARACT EXTRACTION    . COLONOSCOPY      OB History    No data available       Home Medications    Prior to Admission medications   Medication Sig Start Date End Date Taking? Authorizing Provider  acetaminophen (TYLENOL) 500 MG tablet Take 500 mg by mouth every 8 (eight) hours as needed (pain.).  Historical Provider, MD  albuterol (PROVENTIL) (2.5 MG/3ML) 0.083% nebulizer solution Take 2.5 mg by nebulization every 4 (four) hours as needed for wheezing.     Historical Provider, MD  albuterol (PROVENTIL) (2.5 MG/3ML) 0.083% nebulizer solution Take 3 mLs (2.5 mg total) by nebulization 3 (three) times daily. 10/13/16 10/27/16  Costin Karlyne Greenspan, MD  amoxicillin-clavulanate (AUGMENTIN) 875-125 MG tablet Take 1 tablet by mouth 2 (two) times daily. 10/13/16   Costin Karlyne Greenspan, MD  arformoterol (BROVANA)  15 MCG/2ML NEBU Take 2 mLs (15 mcg total) by nebulization 2 (two) times daily. 10/13/16   Costin Karlyne Greenspan, MD  atorvastatin (LIPITOR) 10 MG tablet Take 10 mg by mouth daily at 6 PM.     Historical Provider, MD  budesonide (PULMICORT) 0.5 MG/2ML nebulizer solution Take 2 mLs (0.5 mg total) by nebulization 2 (two) times daily. 10/13/16   [place], MD  calcium-vitamin D (OSCAL WITH D) 500-200 MG-UNIT tablet Take 1 tablet by mouth daily.    Historical Provider, MD  clopidogrel (PLAVIX) 75 MG tablet Take 1 tablet (75 mg total) by mouth daily. 06/13/15   Marcial Pacas, MD  dipyridamole-aspirin (AGGRENOX) 200-25 MG 12hr capsule Take 1 capsule by mouth 2 (two) times daily. 12/27/15   Marcial Pacas, MD  furosemide (LASIX) 20 MG tablet Take 20 mg by mouth daily as needed.    Historical Provider, MD  guaiFENesin (MUCINEX) 600 MG 12 hr tablet Take 600 mg by mouth every 12 (twelve) hours as needed for cough (congestion).     Historical Provider, MD  mirtazapine (REMERON) 15 MG tablet Take 1 tablet (15 mg total) by mouth at bedtime. 10/02/16   Marcial Pacas, MD  Multiple Vitamins-Minerals (DECUBI-VITE) CAPS Take 1 capsule by mouth daily.    Historical Provider, MD  Multiple Vitamins-Minerals (MULTIVITAMIN WITH MINERALS) tablet Take 1 tablet by mouth daily.    Historical Provider, MD  nystatin (NYSTATIN) powder Apply topically 3 (three) times daily. Apply to abdominal folds    Historical Provider, MD  polyethylene glycol powder (GLYCOLAX/MIRALAX) powder Take 17 g by mouth daily as needed (constipation.).     Historical Provider, MD  potassium chloride SA (K-DUR,KLOR-CON) 20 MEQ tablet Take 20 mEq by mouth daily.    Historical Provider, MD  predniSONE (DELTASONE) 10 MG tablet Take 1 tablet (10 mg total) by mouth daily with breakfast. 4 tablets daily x 4 days then 3 daily x 4 days then 2 daily x 4 days then 1 daily x 4 days (40) 10/13/16   Costin Karlyne Greenspan, MD  predniSONE (DELTASONE) 5 MG tablet Take 1 tablet (5 mg total) by  mouth daily with breakfast. 07/14/16   Rigoberto Noel, MD  saccharomyces boulardii (FLORASTOR) 250 MG capsule Take 250 mg by mouth 2 (two) times daily.    Historical Provider, MD    Family History Family History  Problem Relation Age of Onset  . Heart attack Sister   . Alzheimer's disease Sister   . Asthma Son     Social History Social History  Substance Use Topics  . Smoking status: Passive Smoke Exposure - Never Smoker  . Smokeless tobacco: Never Used     Comment: Prior exposure through husband.   . Alcohol use No     Allergies   Nsaids   Review of Systems Review of Systems  Unable to perform ROS: Dementia     Physical Exam Updated Vital Signs BP 111/64   Pulse 81   Temp 97.9 F (36.6 C) (Oral)  Resp 20   SpO2 98%   Physical Exam  Constitutional: She appears well-developed and well-nourished. No distress.  Somnolent, but arousible  HENT:  Head: Normocephalic and atraumatic.  Eyes: Conjunctivae are normal.  Neck: Neck supple.  Cardiovascular: Normal rate and regular rhythm.   No murmur heard. Pulmonary/Chest: Effort normal and breath sounds normal. No respiratory distress.  Abdominal: Soft. There is no tenderness.  Musculoskeletal: She exhibits no edema.  Neurological: She is alert.  Moves all 4ext, answers questions appropriately when asked  Skin: Skin is warm and dry.  Decubitus ulcers in the gluteal cleft & on sacrum that sons say has been present for some time  Psychiatric: She has a normal mood and affect.  Nursing note and vitals reviewed.  ED Treatments / Results  Labs (all labs ordered are listed, but only abnormal results are displayed) Labs Reviewed  COMPREHENSIVE METABOLIC PANEL - Abnormal; Notable for the following:       Result Value   Glucose, Bld 103 (*)    Calcium 8.4 (*)    Total Protein 5.9 (*)    Albumin 2.3 (*)    GFR calc non Af Amer 48 (*)    GFR calc Af Amer 56 (*)    All other components within normal limits  CBC -  Abnormal; Notable for the following:    WBC 11.7 (*)    Hemoglobin 11.5 (*)    HCT 34.4 (*)    RDW 15.8 (*)    All other components within normal limits  URINALYSIS, ROUTINE W REFLEX MICROSCOPIC - Abnormal; Notable for the following:    Color, Urine AMBER (*)    APPearance CLOUDY (*)    Bilirubin Urine SMALL (*)    Protein, ur 30 (*)    All other components within normal limits  CULTURE, BLOOD (ROUTINE X 2)  CULTURE, BLOOD (ROUTINE X 2)  LACTIC ACID, PLASMA  CBC  BASIC METABOLIC PANEL  CBG MONITORING, ED    EKG  EKG Interpretation None       Radiology No results found.  Procedures Procedures (including critical care time)  Medications Ordered in ED Medications - No data to display   Initial Impression / Assessment and Plan / ED Course  I have reviewed the triage vital signs and the nursing notes.  Pertinent labs & imaging results that were available during my care of the patient were reviewed by me and considered in my medical decision making (see chart for details).    Pt with h/o dementia, multiple CVAs presents with somnolence. Sons say that today she was "not acting like herself"; yesterday she was in her normal state & was talkative & interactive w/her family members. Today, they say she was sleeping & unable to be woken up; they said she would open her eyes or answer questions for them, so the facility called 911 to bring her here. On arrival, the Pt was somnolent but arousable and & oriented x3. Confirmed DNR w/sons at the bedside.  VS & exam as above. EKG: SR @ 77bpm w/RBBB similar to tracing from 07/04/16. CXR w/poorly defined left basilar opacity. Labs remarkable for WBC 11.7. UA w/o signs of infection. Rocephin and azithromycin given for presumed CAP.  Will admit the Pt to the Hospitalist's service for further evaluation and workup.  Final Clinical Impressions(s) / ED Diagnoses   Final diagnoses:  Somnolence    New Prescriptions New Prescriptions    No medications on file     Jenny Reichmann, MD  10/26/16 0028    Elnora Morrison, MD 10/26/16 [phone]

## 2016-10-26 DIAGNOSIS — E876 Hypokalemia: Secondary | ICD-10-CM | POA: Diagnosis not present

## 2016-10-26 DIAGNOSIS — I69319 Unspecified symptoms and signs involving cognitive functions following cerebral infarction: Secondary | ICD-10-CM | POA: Diagnosis not present

## 2016-10-26 DIAGNOSIS — J189 Pneumonia, unspecified organism: Secondary | ICD-10-CM | POA: Diagnosis not present

## 2016-10-26 DIAGNOSIS — J309 Allergic rhinitis, unspecified: Secondary | ICD-10-CM

## 2016-10-26 DIAGNOSIS — F0390 Unspecified dementia without behavioral disturbance: Secondary | ICD-10-CM

## 2016-10-26 DIAGNOSIS — G9340 Encephalopathy, unspecified: Secondary | ICD-10-CM | POA: Diagnosis not present

## 2016-10-26 DIAGNOSIS — Z6826 Body mass index (BMI) 26.0-26.9, adult: Secondary | ICD-10-CM | POA: Diagnosis not present

## 2016-10-26 DIAGNOSIS — J9811 Atelectasis: Secondary | ICD-10-CM | POA: Diagnosis present

## 2016-10-26 DIAGNOSIS — R627 Adult failure to thrive: Secondary | ICD-10-CM | POA: Diagnosis present

## 2016-10-26 DIAGNOSIS — G0490 Encephalitis and encephalomyelitis, unspecified: Secondary | ICD-10-CM | POA: Diagnosis not present

## 2016-10-26 DIAGNOSIS — I69993 Ataxia following unspecified cerebrovascular disease: Secondary | ICD-10-CM | POA: Diagnosis not present

## 2016-10-26 DIAGNOSIS — I6930 Unspecified sequelae of cerebral infarction: Secondary | ICD-10-CM | POA: Diagnosis not present

## 2016-10-26 DIAGNOSIS — N183 Chronic kidney disease, stage 3 (moderate): Secondary | ICD-10-CM

## 2016-10-26 DIAGNOSIS — J45909 Unspecified asthma, uncomplicated: Secondary | ICD-10-CM | POA: Diagnosis present

## 2016-10-26 DIAGNOSIS — I69393 Ataxia following cerebral infarction: Secondary | ICD-10-CM | POA: Diagnosis not present

## 2016-10-26 DIAGNOSIS — Z7902 Long term (current) use of antithrombotics/antiplatelets: Secondary | ICD-10-CM | POA: Diagnosis not present

## 2016-10-26 DIAGNOSIS — E785 Hyperlipidemia, unspecified: Secondary | ICD-10-CM | POA: Diagnosis not present

## 2016-10-26 DIAGNOSIS — L89152 Pressure ulcer of sacral region, stage 2: Secondary | ICD-10-CM | POA: Diagnosis present

## 2016-10-26 DIAGNOSIS — I872 Venous insufficiency (chronic) (peripheral): Secondary | ICD-10-CM | POA: Diagnosis present

## 2016-10-26 DIAGNOSIS — Z515 Encounter for palliative care: Secondary | ICD-10-CM | POA: Diagnosis not present

## 2016-10-26 DIAGNOSIS — R531 Weakness: Secondary | ICD-10-CM | POA: Diagnosis not present

## 2016-10-26 DIAGNOSIS — Z993 Dependence on wheelchair: Secondary | ICD-10-CM | POA: Diagnosis not present

## 2016-10-26 DIAGNOSIS — R4182 Altered mental status, unspecified: Secondary | ICD-10-CM | POA: Diagnosis not present

## 2016-10-26 DIAGNOSIS — Z66 Do not resuscitate: Secondary | ICD-10-CM | POA: Diagnosis not present

## 2016-10-26 DIAGNOSIS — G8194 Hemiplegia, unspecified affecting left nondominant side: Secondary | ICD-10-CM | POA: Diagnosis present

## 2016-10-26 DIAGNOSIS — I639 Cerebral infarction, unspecified: Secondary | ICD-10-CM | POA: Diagnosis not present

## 2016-10-26 DIAGNOSIS — R0609 Other forms of dyspnea: Secondary | ICD-10-CM | POA: Diagnosis not present

## 2016-10-26 DIAGNOSIS — Y95 Nosocomial condition: Secondary | ICD-10-CM | POA: Diagnosis present

## 2016-10-26 DIAGNOSIS — E43 Unspecified severe protein-calorie malnutrition: Secondary | ICD-10-CM | POA: Diagnosis present

## 2016-10-26 DIAGNOSIS — H5461 Unqualified visual loss, right eye, normal vision left eye: Secondary | ICD-10-CM | POA: Diagnosis present

## 2016-10-26 DIAGNOSIS — Z79899 Other long term (current) drug therapy: Secondary | ICD-10-CM | POA: Diagnosis not present

## 2016-10-26 LAB — BASIC METABOLIC PANEL
ANION GAP: 7 (ref 5–15)
BUN: 11 mg/dL (ref 6–20)
CALCIUM: 7.9 mg/dL — AB (ref 8.9–10.3)
CO2: 21 mmol/L — ABNORMAL LOW (ref 22–32)
Chloride: 112 mmol/L — ABNORMAL HIGH (ref 101–111)
Creatinine, Ser: 0.87 mg/dL (ref 0.44–1.00)
GFR, EST NON AFRICAN AMERICAN: 56 mL/min — AB (ref >60)
GLUCOSE: 123 mg/dL — AB (ref 65–99)
POTASSIUM: 3.5 mmol/L (ref 3.5–5.1)
SODIUM: 140 mmol/L (ref 135–145)

## 2016-10-26 LAB — CBC
HCT: 33.5 % — ABNORMAL LOW (ref 36.0–46.0)
Hemoglobin: 10.9 g/dL — ABNORMAL LOW (ref 12.0–15.0)
MCH: 28.5 pg (ref 26.0–34.0)
MCHC: 32.5 g/dL (ref 30.0–36.0)
MCV: 87.7 fL (ref 78.0–100.0)
PLATELETS: 217 10*3/uL (ref 150–400)
RBC: 3.82 MIL/uL — AB (ref 3.87–5.11)
RDW: 15.9 % — AB (ref 11.5–15.5)
WBC: 11 10*3/uL — AB (ref 4.0–10.5)

## 2016-10-26 LAB — MRSA PCR SCREENING: MRSA by PCR: POSITIVE — AB

## 2016-10-26 LAB — LACTIC ACID, PLASMA: Lactic Acid, Venous: 0.7 mmol/L (ref 0.5–1.9)

## 2016-10-26 MED ORDER — ALBUTEROL SULFATE (2.5 MG/3ML) 0.083% IN NEBU: 2.5000 mg | INHALATION_SOLUTION | RESPIRATORY_TRACT | Status: DC | PRN

## 2016-10-26 MED ORDER — BUDESONIDE 0.5 MG/2ML IN SUSP: 0.5000 mg | Freq: Two times a day (BID) | RESPIRATORY_TRACT | Status: DC

## 2016-10-26 MED ORDER — VANCOMYCIN HCL IN DEXTROSE 1-5 GM/200ML-% IV SOLN
1000.0000 mg | Freq: Once | INTRAVENOUS | Status: AC
  Administered 2016-10-26: 1000 mg via INTRAVENOUS
  Filled 2016-10-26: qty 200

## 2016-10-26 MED ORDER — IPRATROPIUM-ALBUTEROL 0.5-2.5 (3) MG/3ML IN SOLN: 3.0000 mL | RESPIRATORY_TRACT | Status: DC | PRN

## 2016-10-26 MED ORDER — BUDESONIDE 0.5 MG/2ML IN SUSP
0.5000 mg | Freq: Two times a day (BID) | RESPIRATORY_TRACT | Status: DC
  Administered 2016-10-26 – 2016-10-29 (×7): 0.5 mg via RESPIRATORY_TRACT
  Filled 2016-10-26 (×7): qty 2

## 2016-10-26 MED ORDER — DEXTROSE 5 % IV SOLN: 1.0000 g | Freq: Three times a day (TID) | INTRAVENOUS | Status: DC

## 2016-10-26 MED ORDER — IPRATROPIUM-ALBUTEROL 0.5-2.5 (3) MG/3ML IN SOLN
3.0000 mL | Freq: Four times a day (QID) | RESPIRATORY_TRACT | Status: DC
  Administered 2016-10-26: 3 mL via RESPIRATORY_TRACT
  Filled 2016-10-26: qty 3

## 2016-10-26 MED ORDER — CHLORHEXIDINE GLUCONATE CLOTH 2 % EX PADS
6.0000 | MEDICATED_PAD | Freq: Every day | CUTANEOUS | Status: AC
Start: 2016-10-26 — End: 2016-10-30
  Administered 2016-10-26 – 2016-10-30 (×5): 6 via TOPICAL

## 2016-10-26 MED ORDER — IPRATROPIUM-ALBUTEROL 0.5-2.5 (3) MG/3ML IN SOLN
3.0000 mL | Freq: Three times a day (TID) | RESPIRATORY_TRACT | Status: DC
  Administered 2016-10-26 – 2016-10-27 (×3): 3 mL via RESPIRATORY_TRACT
  Filled 2016-10-26 (×3): qty 3

## 2016-10-26 MED ORDER — SODIUM CHLORIDE 0.9 % IV BOLUS (SEPSIS)
500.0000 mL | Freq: Once | INTRAVENOUS | Status: AC
  Administered 2016-10-26: 500 mL via INTRAVENOUS

## 2016-10-26 MED ORDER — VANCOMYCIN HCL IN DEXTROSE 1-5 GM/200ML-% IV SOLN
1000.0000 mg | INTRAVENOUS | Status: DC
  Administered 2016-10-26 – 2016-10-28 (×2): 1000 mg via INTRAVENOUS
  Filled 2016-10-26 (×2): qty 200

## 2016-10-26 MED ORDER — ASPIRIN 300 MG RE SUPP
300.0000 mg | Freq: Every day | RECTAL | Status: DC
  Administered 2016-10-26 – 2016-10-27 (×2): 300 mg via RECTAL
  Filled 2016-10-26 (×3): qty 1

## 2016-10-26 MED ORDER — ARFORMOTEROL TARTRATE 15 MCG/2ML IN NEBU
15.0000 ug | INHALATION_SOLUTION | Freq: Two times a day (BID) | RESPIRATORY_TRACT | Status: DC
  Administered 2016-10-26 – 2016-10-29 (×7): 15 ug via RESPIRATORY_TRACT
  Filled 2016-10-26 (×7): qty 2

## 2016-10-26 MED ORDER — DEXTROSE 5 % IV SOLN
2.0000 g | INTRAVENOUS | Status: DC
  Administered 2016-10-26 – 2016-10-29 (×4): 2 g via INTRAVENOUS
  Filled 2016-10-26 (×6): qty 2

## 2016-10-26 MED ORDER — ARFORMOTEROL TARTRATE 15 MCG/2ML IN NEBU: 15.0000 ug | INHALATION_SOLUTION | Freq: Two times a day (BID) | RESPIRATORY_TRACT | Status: DC

## 2016-10-26 MED ORDER — MUPIROCIN 2 % EX OINT
1.0000 "application " | TOPICAL_OINTMENT | Freq: Two times a day (BID) | CUTANEOUS | Status: AC
  Administered 2016-10-26 – 2016-10-30 (×10): 1 via NASAL
  Filled 2016-10-26 (×3): qty 22

## 2016-10-26 NOTE — ED Notes (Signed)
Attempted to call report

## 2016-10-26 NOTE — Progress Notes (Signed)
PROGRESS NOTE    Kourtlyn Charlet  GYI:[id number] DOB: [birth year] DOA: 10/25/2016 PCP: Gennette Pac, MD   Brief Narrative:  Harjot Zavadil is a 83 y.o. woman with a history of multiple prior strokes, blindness in her right eye, CKD 3, severe asthma, moderate to severe protein calorie malnutrition, and dementia who is now a SNF resident.  She was transferred to the ED today for evaluation of altered mental status, increased lethargy, generalized weakness.  Her husband and son are at bedside and provide clinical history.  Around noon today, she was found to more lethargic during lunch.  She would not open her eyes and eat.  She asked to be put in the bed around 2PM.  By dinner, her family feels that she was barely arousable and requested transfer.  No documented fever.  Family denies overt signs of aspiration with meals, though they admit that her PO intake has been very poor.  She has not indicated pain in her chest, abdomen, or back.  No complaints of headache.  She is essentially wheelchair bound, and she has pressure ulcers on her sacrum and just below her buttock.  She had a recent admission to [facility] for severe asthma attack and possible aspiration pneumonia.  ED Course: WBCcount 11.5.  Chest xray shows a LLL opacity (today's chest xray compared to one done earlier this month; reviewed by me; infiltrate appears to be acute).  Lactic acid level 0.7.  Head CT negative for acute process.  She has received Rocephin and azithromycin in the ED.  Blood cultures drawn.  She received 1L of NS.  Hospitalist asked to admit.    Assessment & Plan:   Principal Problem:   Acute encephalopathy Active Problems:   HCAP (healthcare-associated pneumonia)   Allergic rhinitis   Hyperlipidemia   Venous insufficiency   Weakness   History of mixed hemorraghic embolic stroke   Ataxia S/P CVA   CKD (chronic kidney disease), stage III   CVA (cerebral vascular accident) ([place])   History of CVA with residual  deficit   Residual cognitive deficit as late effect of stroke   Stage 3 chronic kidney disease   History of stroke   Moderate dementia without behavioral disturbance   Pressure injury of skin   Adult failure to thrive  #1 acute encephalopathy likely secondary to healthcare associated pneumonia Patient presenting with neck surgery, weakness and chest x-ray concerning for possible pneumonia versus atelectasis. Patient afebrile. Patient with a leukocytosis. Blood cultures pending. MRSA PCR positive. Continue empiric IV vancomycin and IV cefepime. SLP. If no significant improvement with symptoms in the next 24-48 hours will get a MRI of the head for further evaluation. Follow.  #2 hyperlipidemia Continue statin.  #3 chronic kidney disease stage III At baseline. Avoid nephrotoxic agents.  #4 history of CVA Continue Plavix for secondary stroke prevention. Continue statin.  #5 stage II pressure ulcers present on admission Will get consultation pending.   DVT prophylaxis: SCDs Code Status: DO NOT RESUSCITATE Family Communication: No family at bedside. Disposition Plan: Back to skilled nursing facility when medically improved and back to baseline.   Consultants:   None  Procedures:   CT head 10/25/2016  Chest x-ray 10/25/2016  Antimicrobials:  IV cefepime 10/26/2016  IV Rocephin 10/25/2016 1  IV vancomycin for 22 2018  IV azithromycin 10/25/2016 1   Subjective: Patient sleeping, answers to verbal questions however drifts back to sleep. Somewhat drowsy. Patient alert to self and place. Patient denies chest pain. No shortness of breath.  Objective: Vitals:   10/26/16 0340 10/26/16 0500 10/26/16 0739 10/26/16 0925  BP: (!) 94/57 (!) 94/48 (!) 107/57   Pulse: 71 75 70   Resp: 16 17 (!) 22   Temp: 97.1 F (36.2 C)  98.6 F (37 C)   TempSrc: Oral  Axillary   SpO2: 98% 96% 98% 100%  Weight:      Height:        Intake/Output Summary (Last 24 hours) at 10/26/16  0929 Last data filed at 10/26/16 0700  Gross per 24 hour  Intake              605 ml  Output                0 ml  Net              605 ml   Filed Weights   10/26/16 0300  Weight: 70.9 kg (156 lb 4.9 oz)    Examination:  General exam: Asleep. Drowsy. Dry mucous membranes. Respiratory system: Clear to auscultation anterior lung fields. Respiratory effort normal. Cardiovascular system: S1 & S2 heard, RRR. No JVD, murmurs, rubs, gallops or clicks. No pedal edema. Gastrointestinal system: Abdomen is nondistended, soft and nontender. No organomegaly or masses felt. Normal bowel sounds heard. Central nervous system: Alert and oriented. No focal neurological deficits. Extremities: Symmetric 5 x 5 power. Skin: No rashes, lesions or ulcers Psychiatry: Judgement and insight appear normal. Mood & affect appropriate.     Data Reviewed: I have personally reviewed following labs and imaging studies  CBC:  Recent Labs Lab 10/25/16 2020 10/26/16 0051  WBC 11.7* 11.0*  HGB 11.5* 10.9*  HCT 34.4* 33.5*  MCV 87.3 87.7  PLT 225 782   Basic Metabolic Panel:  Recent Labs Lab 10/25/16 2020 10/26/16 0051  NA 140 140  K 3.7 3.5  CL 109 112*  CO2 22 21*  GLUCOSE 103* 123*  BUN 11 11  CREATININE 0.99 0.87  CALCIUM 8.4* 7.9*   GFR: Estimated Creatinine Clearance: 39.9 mL/min (by C-G formula based on SCr of 0.87 mg/dL). Liver Function Tests:  Recent Labs Lab 10/25/16 2020  AST 21  ALT 17  ALKPHOS 62  BILITOT 0.9  PROT 5.9*  ALBUMIN 2.3*   No results for input(s): LIPASE, AMYLASE in the last 168 hours. No results for input(s): AMMONIA in the last 168 hours. Coagulation Profile: No results for input(s): INR, PROTIME in the last 168 hours. Cardiac Enzymes: No results for input(s): CKTOTAL, CKMB, CKMBINDEX, TROPONINI in the last 168 hours. BNP (last 3 results)  Recent Labs  05/05/16 1521  PROBNP 16.0   HbA1C: No results for input(s): HGBA1C in the last 72  hours. CBG:  Recent Labs Lab 10/25/16 1927  GLUCAP 97   Lipid Profile: No results for input(s): CHOL, HDL, LDLCALC, TRIG, CHOLHDL, LDLDIRECT in the last 72 hours. Thyroid Function Tests: No results for input(s): TSH, T4TOTAL, FREET4, T3FREE, THYROIDAB in the last 72 hours. Anemia Panel: No results for input(s): VITAMINB12, FOLATE, FERRITIN, TIBC, IRON, RETICCTPCT in the last 72 hours. Sepsis Labs:  Recent Labs Lab 10/26/16 0039  LATICACIDVEN 0.7    Recent Results (from the past 240 hour(s))  MRSA PCR Screening     Status: Abnormal   Collection Time: 10/26/16  2:48 AM  Result Value Ref Range Status   MRSA by PCR POSITIVE (A) NEGATIVE Final    Comment:        The GeneXpert MRSA Assay (FDA approved for NASAL specimens  only), is one component of a comprehensive MRSA colonization surveillance program. It is not intended to diagnose MRSA infection nor to guide or monitor treatment for MRSA infections. RESULT CALLED TO, READ BACK BY AND VERIFIED WITH: [facility] RN [phone] AT [phone] [facility]          Radiology Studies: Ct Head Wo Contrast  Result Date: 10/25/2016 CLINICAL DATA:  Altered mental status.  Baseline dementia. EXAM: CT HEAD WITHOUT CONTRAST TECHNIQUE: Contiguous axial images were obtained from the base of the skull through the vertex without intravenous contrast. COMPARISON:  Head CT dated 09/11/2015 and brain MR dated 09/11/2015. FINDINGS: Brain: Diffusely enlarged ventricles and subarachnoid spaces. Patchy white matter low density in both cerebral hemispheres. Old left alignment lacunar infarct, unchanged. Stable old bilateral cerebellar hemisphere infarcts. No intracranial hemorrhage, mass lesion or CT evidence of acute infarction. Vascular: No hyperdense vessel or unexpected calcification. Skull: Normal. Negative for fracture or focal lesion. Sinuses/Orbits: Almost completely opacified sphenoid sinus with mild improvement. Bilateral globe post lens replacement  changes. Other: None. IMPRESSION: 1. No acute abnormality. 2. Stable atrophy, chronic small vessel white matter ischemic changes and old infarcts. 3. Longstanding chronic right sphenoid sinusitis with mild improvement. Electronically Signed   By: Claudie Revering M.D.   On: 10/25/2016 20:52   Dg Chest Portable 1 View  Result Date: 10/25/2016 CLINICAL DATA:  Altered mental status EXAM: PORTABLE CHEST 1 VIEW COMPARISON:  10/11/2016, 10/10/2016 FINDINGS: Low lung volumes. Vague opacity at the left lung base. Elevated left diaphragm with probable air filled bowel in the left upper quadrant. Stable cardiomediastinal silhouette with tortuous unfolded aorta. No pneumothorax. IMPRESSION: 1. Poorly defined opacity at the left lung base, could reflect atelectasis or an infiltrate. Electronically Signed   By: Donavan Foil M.D.   On: 10/25/2016 20:00        Scheduled Meds: . arformoterol  15 mcg Nebulization BID  . aspirin  300 mg Rectal Daily  . budesonide  0.5 mg Nebulization BID  . Chlorhexidine Gluconate Cloth  6 each Topical Q0600  . ipratropium-albuterol  3 mL Nebulization Q6H  . mupirocin ointment  1 application Nasal BID   Continuous Infusions: . ceFEPime (MAXIPIME) IV Stopped (10/26/16 0330)  . dextrose 5 % and 0.45% NaCl 100 mL/hr at 10/26/16 0840  . [START ON 10/27/2016] vancomycin       LOS: 0 days    Time spent: 17 minutes    Cymone Yeske, MD [facility] Pager [phone]  If 7PM-7AM, please contact night-coverage [email or web address] Password [email or web address] 10/26/2016, 9:29 AM

## 2016-10-26 NOTE — Evaluation (Signed)
Clinical/Bedside Swallow Evaluation Patient Details  Name: Darlene Murray MRN: [id number] Date of Birth: Dec 20, 1921  Today's Date: 10/26/2016 Time: SLP Start Time (ACUTE ONLY): 2751 SLP Stop Time (ACUTE ONLY): 1001 SLP Time Calculation (min) (ACUTE ONLY): 23 min  Past Medical History:  Past Medical History:  Diagnosis Date  . Acute left hemiparesis ([place])   . Allergic rhinitis   . Antibiotic-associated diarrhea   . Asthma    Moderate, persistent  . Asthmatic bronchitis   . Blindness    Legally blind, right eye  . Cough   . Fibrocystic breast disease   . HNP (herniated nucleus pulposus), lumbar    L4-L5  . Hyperlipidemia   . Left ventricular hypertrophy 2008   mild  . Physical deconditioning   . Protein calorie malnutrition ([place])   . Right sided weakness   . Right-sided lacunar infarction ([place])   . Stroke (cerebrum) ([place])   . Venous insufficiency    lower extremities   Past Surgical History:  Past Surgical History:  Procedure Laterality Date  . CATARACT EXTRACTION    . COLONOSCOPY     HPI:  Darlene Murray a 83 y.o.woman with a history of multiple prior strokes, blindness in her right eye, CKD 3, severe asthma, moderate to severe protein calorie malnutrition, and dementia who is now a SNF resident. She was transferred to the ED today for evaluation of altered mental status, increased lethargy, generalized weakness. Her husband and son are at bedside and provide clinical history. Around noon today, she was found to more lethargic during lunch. She would not open her eyes and eat. She asked to be put in the bed around 2PM. By dinner, her family feels that she was barely arousable and requested transfer. No documented fever. Family denies overt signs of aspiration with meals, though they admit that her PO intake has been very poor. She has not indicated pain in her chest, abdomen, or back. No complaints of headache. She is essentially wheelchair bound, and she has  pressure ulcers on her sacrum and just below her buttock.  CT head negative for acute findings.  Chest xray is showing poorly defined opacity in the left lung base, atelectasis vs infiltrate.     Assessment / Plan / Recommendation Clinical Impression  Clinical swallowing evaluation was completed using thin liquids, pureed material and dual textsured solids.  Oral mechanism exam was completed and patient was noted to have left labial droop with weakness and decreased lingual ROM.  Lingual strength was unable to be assessed.  Suspect generalized weakness.  Of note,  the patient had MBS dated 10/09/2016 with recommendation for a dysphagia 3 diet with thin liquids.  No penetration/aspiration was seen on that study.  Clinically today the patient presented with delayed oral transit with mild oral residue seen given dual textured solids and delayed swallow trigger.  Overt s/s of aspiration were not seen.  Recommend begin a dysphagia 3 diet with thin liquids as long as the patient is awake/alert.  ST will continue to follow for therapeutic diet tolerance.   SLP Visit Diagnosis: Dysphagia, oropharyngeal phase (R13.12)    Aspiration Risk  Mild aspiration risk    Diet Recommendation  Dysphagia 3 with thin liquids.   Medication Administration: Whole meds with puree    Other  Recommendations Oral Care Recommendations: Oral care QID   Follow up Recommendations Other (comment) (TBD)      Frequency and Duration min 2x/week  2 weeks       Prognosis Prognosis  for Safe Diet Advancement: Fair      Swallow Study   General Date of Onset: 10/25/16 HPI: Darlene Murray a 83 y.o.woman with a history of multiple prior strokes, blindness in her right eye, CKD 3, severe asthma, moderate to severe protein calorie malnutrition, and dementia who is now a SNF resident. She was transferred to the ED today for evaluation of altered mental status, increased lethargy, generalized weakness. Her husband and son are at  bedside and provide clinical history. Around noon today, she was found to more lethargic during lunch. She would not open her eyes and eat. She asked to be put in the bed around 2PM. By dinner, her family feels that she was barely arousable and requested transfer. No documented fever. Family denies overt signs of aspiration with meals, though they admit that her PO intake has been very poor. She has not indicated pain in her chest, abdomen, or back. No complaints of headache. She is essentially wheelchair bound, and she has pressure ulcers on her sacrum and just below her buttock.  CT head negative for acute findings.  Chest xray is showing poorly defined opacity in the left lung base, atelectasis vs infiltrate.   Type of Study: Bedside Swallow Evaluation Previous Swallow Assessment: MBS dated 10/09/2016 with recommendation for a dysphagia 3 diet with thin liquids.   Diet Prior to this Study: NPO Temperature Spikes Noted: No Respiratory Status: Room air History of Recent Intubation: No Behavior/Cognition: Cooperative;Alert;Requires cueing Oral Cavity Assessment: Within Functional Limits Oral Care Completed by SLP: Yes Oral Cavity - Dentition: Dentures, top;Dentures, bottom Self-Feeding Abilities: Needs assist Patient Positioning: Upright in bed Baseline Vocal Quality: Low vocal intensity Volitional Swallow: Able to elicit    Oral/Motor/Sensory Function Overall Oral Motor/Sensory Function: Mild impairment Facial ROM: Reduced left Facial Symmetry: Abnormal symmetry left Facial Strength: Reduced left Lingual ROM: Reduced right;Reduced left Lingual Symmetry: Within Functional Limits Lingual Strength: Reduced Mandible: Within Functional Limits   Ice Chips Ice chips: Not tested   Thin Liquid Thin Liquid: Impaired Presentation: Cup;Spoon Oral Phase Impairments: Reduced labial seal Oral Phase Functional Implications: Left anterior spillage Pharyngeal  Phase Impairments: Suspected  delayed Swallow    Nectar Thick Nectar Thick Liquid: Not tested   Honey Thick Honey Thick Liquid: Not tested   Puree Puree: Impaired Presentation: Spoon Oral Phase Impairments: Impaired mastication Oral Phase Functional Implications: Prolonged oral transit Pharyngeal Phase Impairments: Suspected delayed Swallow   Solid   GO   Solid: Impaired Presentation: Spoon Oral Phase Impairments: Impaired mastication Oral Phase Functional Implications: Impaired mastication;Prolonged oral transit;Oral residue Pharyngeal Phase Impairments: Suspected delayed Swallow    Functional Assessment Tool Used: AHSA NOMS and clinical judgment.   Functional Limitations: Swallowing Swallow Current Status [phone]): At least 20 percent but less than 40 percent impaired, limited or restricted Swallow Goal Status [phone]): At least 20 percent but less than 40 percent impaired, limited or restricted    Shelly Flatten, [place], [place] [facility] SLP [phone] Lamar Sprinkles 10/26/2016,10:10 AM

## 2016-10-26 NOTE — Progress Notes (Signed)
MRSA swab positive and patient placed on contact protocol.

## 2016-10-26 NOTE — Progress Notes (Signed)
Pharmacy Antibiotic Note  Darlene Murray is a 83 y.o. female admitted on 10/25/2016 with pneumonia.  Pharmacy has been consulted for vancomycin dosing.  Plan: Rec'd Rocephin and azithro in ED. Vancomycin 1000mg  IV every 24 hours.  Goal trough 15-20 mcg/mL.  Will adjust cefepime to 2g IV Q24H for renal function.   Temp (24hrs), Avg:97.9 F (36.6 C), Min:97.9 F (36.6 C), Max:97.9 F (36.6 C)   Recent Labs Lab 10/25/16 2020  WBC 11.7*  CREATININE 0.99    Estimated Creatinine Clearance: 34.2 mL/min (by C-G formula based on SCr of 0.99 mg/dL).    Allergies  Allergen Reactions  . Nsaids Other (See Comments)    Unknown reaction - listed on MAR     Thank you for allowing pharmacy to be a part of this patient's care.  Wynona Neat, PharmD, BCPS  10/26/2016 12:26 AM

## 2016-10-26 NOTE — Evaluation (Signed)
Speech Language Pathology Evaluation Patient Details Name: Darlene Murray MRN: [id number] DOB: [birth year] Today's Date: 10/26/2016 Time: 8250-5397 SLP Time Calculation (min) (ACUTE ONLY): 11 min  Problem List:  Patient Active Problem List   Diagnosis Date Noted  . Adult failure to thrive 10/26/2016  . HCAP (healthcare-associated pneumonia) 10/25/2016  . Acute encephalopathy 10/25/2016  . Pressure injury of skin 10/09/2016  . Severe asthma with acute exacerbation 10/08/2016  . Polypharmacy 10/02/2016  . Acute respiratory failure with hypoxia ([place]) 07/07/2016  . Influenza with respiratory manifestation 07/07/2016  . Moderate dementia without behavioral disturbance 07/07/2016  . Severe persistent asthma with exacerbation   . COPD exacerbation ([place])   . Stage 3 chronic kidney disease   . History of stroke   . Dyspnea, unspecified 05/05/2016  . Residual cognitive deficit as late effect of stroke 04/15/2016  . Neuropathic pain of shoulder 11/29/2015  . Decreased GFR   . Asthma, severe persistent   . Thalamic infarct, acute ([place]) 09/13/2015  . Right-sided lacunar infarction ([place])   . History of CVA with residual deficit   . Asthma, moderate persistent   . Left-sided weakness 09/11/2015  . CVA (cerebral vascular accident) ([place])   . Acromioclavicular joint arthritis 08/20/2015  . Acute prerenal azotemia 08/02/2015  . CKD (chronic kidney disease), stage III 08/02/2015  . Memory loss 06/15/2015  . Spondylosis, cervical, with myelopathy 06/13/2015  . Ataxia S/P CVA 05/17/2015  . Gait disturbance, post-stroke 05/17/2015  . Urinary tract infection 04/23/2015  . Acute hemorrhagic infarction of brain ([place]) 04/10/2015  . Ataxia   . History of mixed hemorraghic embolic stroke 67/34/1937  . Weakness 04/06/2015  . Generalized weakness 04/06/2015  . Nausea with vomiting 04/06/2015  . Left ventricular diastolic dysfunction with preserved systolic function 90/24/0973  . Severe persistent  asthma in adult steroid dependent   . Allergic rhinitis   . Hyperlipidemia   . Venous insufficiency   . HNP (herniated nucleus pulposus), lumbar   . Left ventricular hypertrophy    Past Medical History:  Past Medical History:  Diagnosis Date  . Acute left hemiparesis ([place])   . Allergic rhinitis   . Antibiotic-associated diarrhea   . Asthma    Moderate, persistent  . Asthmatic bronchitis   . Blindness    Legally blind, right eye  . Cough   . Fibrocystic breast disease   . HNP (herniated nucleus pulposus), lumbar    L4-L5  . Hyperlipidemia   . Left ventricular hypertrophy 2008   mild  . Physical deconditioning   . Protein calorie malnutrition ([place])   . Right sided weakness   . Right-sided lacunar infarction ([place])   . Stroke (cerebrum) ([place])   . Venous insufficiency    lower extremities   Past Surgical History:  Past Surgical History:  Procedure Laterality Date  . CATARACT EXTRACTION    . COLONOSCOPY     HPI:  Darlene Murray a 83 y.o.woman with a history of multiple prior strokes, blindness in her right eye, CKD 3, severe asthma, moderate to severe protein calorie malnutrition, and dementia who is now a SNF resident. She was transferred to the ED today for evaluation of altered mental status, increased lethargy, generalized weakness. Her husband and son are at bedside and provide clinical history. Around noon today, she was found to more lethargic during lunch. She would not open her eyes and eat. She asked to be put in the bed around 2PM. By dinner, her family feels that she was barely arousable and requested  transfer. No documented fever. Family denies overt signs of aspiration with meals, though they admit that her PO intake has been very poor. She has not indicated pain in her chest, abdomen, or back. No complaints of headache. She is essentially wheelchair bound, and she has pressure ulcers on her sacrum and just below her buttock.  CT head negative for acute  findings.  Chest xray is showing poorly defined opacity in the left lung base, atelectasis vs infiltrate.     Assessment / Plan / Recommendation Clinical Impression  Clinical swallowing evaluation was completed using thin liquids, pureed material and dual textsured solids.  Oral mechanism exam was completed and patient was noted to have left labial droop with weakness and decreased lingual ROM.  Lingual strength was unable to be assessed.  Suspect generalized weakness.  Of note,  the patient had MBS dated 10/09/2016 with recommendation for a dysphagia 3 diet with thin liquids.  No penetration/aspiration was seen on that study.  Clinically today the patient presented with delayed oral transit with mild oral residue seen given dual textured solids and delayed swallow trigger.  Overt s/s of aspiration were not seen.  Recommend begin a dysphagia 3 diet with thin liquids as long as the patient is awake/alert.  ST will continue to follow for therapeutic diet tolerance.      SLP Assessment  SLP Recommendation/Assessment:  (At discretion of the facility SLP.) SLP Visit Diagnosis: Dysphagia, oropharyngeal phase (R13.12)    Follow Up Recommendations  Other (comment) (TBD)          SLP Evaluation Cognition  Overall Cognitive Status: History of cognitive impairments - at baseline Arousal/Alertness: Awake/alert Orientation Level: Oriented to person;Disoriented to place;Disoriented to time;Disoriented to situation Attention: Sustained Sustained Attention: Appears intact Memory: Impaired Memory Impairment: Decreased recall of new information (3/3 immediate recall  3/3 delayed recall) Awareness: Impaired Awareness Impairment: Intellectual impairment       Comprehension  Auditory Comprehension Commands: Impaired One Step Basic Commands: 75-100% accurate Two Step Basic Commands: 25-49% accurate Conversation: Simple Reading Comprehension Reading Status: Not tested    Expression Expression Primary  Mode of Expression: Verbal Verbal Expression Automatic Speech: Name;Social Response Repetition: No impairment Naming: No impairment Non-Verbal Means of Communication: Not applicable Written Expression Written Expression: Not tested   Oral / Motor  Oral Motor/Sensory Function Overall Oral Motor/Sensory Function: Mild impairment Facial ROM: Reduced left Facial Symmetry: Abnormal symmetry left Facial Strength: Reduced left Lingual ROM: Reduced right;Reduced left Lingual Symmetry: Within Functional Limits Lingual Strength: Reduced Mandible: Within Functional Limits   GO          Functional Assessment Tool Used: AHSA NOMS and clinical judgment.   Functional Limitations: Swallowing Swallow Current Status [phone]): At least 20 percent but less than 40 percent impaired, limited or restricted Swallow Goal Status [phone]): At least 20 percent but less than 40 percent impaired, limited or restricted         Shelly Flatten, [place], [place] [facility] SLP [phone] Lamar Sprinkles 10/26/2016, 10:17 AM

## 2016-10-27 LAB — CBC WITH DIFFERENTIAL/PLATELET
BASOS PCT: 0 %
Basophils Absolute: 0 10*3/uL (ref 0.0–0.1)
EOS ABS: 0.6 10*3/uL (ref 0.0–0.7)
Eosinophils Relative: 7 %
HCT: 33.1 % — ABNORMAL LOW (ref 36.0–46.0)
HEMOGLOBIN: 10.6 g/dL — AB (ref 12.0–15.0)
LYMPHS ABS: 2.3 10*3/uL (ref 0.7–4.0)
Lymphocytes Relative: 28 %
MCH: 28.3 pg (ref 26.0–34.0)
MCHC: 32 g/dL (ref 30.0–36.0)
MCV: 88.3 fL (ref 78.0–100.0)
Monocytes Absolute: 0.9 10*3/uL (ref 0.1–1.0)
Monocytes Relative: 11 %
NEUTROS PCT: 54 %
Neutro Abs: 4.3 10*3/uL (ref 1.7–7.7)
Platelets: 237 10*3/uL (ref 150–400)
RBC: 3.75 MIL/uL — AB (ref 3.87–5.11)
RDW: 15.9 % — ABNORMAL HIGH (ref 11.5–15.5)
WBC: 8.1 10*3/uL (ref 4.0–10.5)

## 2016-10-27 LAB — BASIC METABOLIC PANEL
ANION GAP: 10 (ref 5–15)
BUN: 5 mg/dL — ABNORMAL LOW (ref 6–20)
CHLORIDE: 108 mmol/L (ref 101–111)
CO2: 24 mmol/L (ref 22–32)
Calcium: 7.9 mg/dL — ABNORMAL LOW (ref 8.9–10.3)
Creatinine, Ser: 0.83 mg/dL (ref 0.44–1.00)
GFR calc Af Amer: >60 mL/min (ref >60)
GFR calc non Af Amer: 59 mL/min — ABNORMAL LOW (ref >60)
Glucose, Bld: 91 mg/dL (ref 65–99)
POTASSIUM: 3.6 mmol/L (ref 3.5–5.1)
SODIUM: 142 mmol/L (ref 135–145)

## 2016-10-27 LAB — CORTISOL: CORTISOL PLASMA: 6.1 ug/dL

## 2016-10-27 MED ORDER — ENSURE ENLIVE PO LIQD
237.0000 mL | Freq: Two times a day (BID) | ORAL | Status: DC
  Administered 2016-10-27: 237 mL via ORAL
  Administered 2016-10-28: 120 mL via ORAL
  Administered 2016-10-28: 100 mL via ORAL

## 2016-10-27 MED ORDER — COLLAGENASE 250 UNIT/GM EX OINT
TOPICAL_OINTMENT | Freq: Every day | CUTANEOUS | Status: DC
  Administered 2016-10-27 – 2016-11-01 (×6): via TOPICAL
  Filled 2016-10-27 (×2): qty 30

## 2016-10-27 MED ORDER — IPRATROPIUM-ALBUTEROL 0.5-2.5 (3) MG/3ML IN SOLN: 3.0000 mL | Freq: Four times a day (QID) | RESPIRATORY_TRACT | Status: DC | PRN

## 2016-10-27 NOTE — Progress Notes (Signed)
  Speech Language Pathology Treatment: Dysphagia  Patient Details Name: Darlene Murray MRN: [id number] DOB: April 17, 1922 Today's Date: 10/27/2016 Time: 0945-1000 SLP Time Calculation (min) (ACUTE ONLY): 15 min  Assessment / Plan / Recommendation Clinical Impression  Pt alert and able to tolerate repositioning for PO intake. Assisted pt with oral care, removed denture and cleaned and cleaned mouth. After replacing dentures pt willing to try peanut butter and grahams. Pt could not bite cracker and had mild residue on the right side of lips with larger bites. SLP provided small controlled bites with much greater success. No difficulty with liquids. Recommend pt continue a mechanical soft diet and thin liquids as she tolerates this texture and bite size best. Will sign off, no f/u needed.   HPI HPI: Darlene Murray a 83 y.o.woman with a history of multiple prior strokes, blindness in her right eye, CKD 3, severe asthma, moderate to severe protein calorie malnutrition, and dementia who is now a SNF resident. She was transferred to the ED today for evaluation of altered mental status, increased lethargy, generalized weakness. Her husband and son are at bedside and provide clinical history. Around noon today, she was found to more lethargic during lunch. She would not open her eyes and eat. She asked to be put in the bed around 2PM. By dinner, her family feels that she was barely arousable and requested transfer. No documented fever. Family denies overt signs of aspiration with meals, though they admit that her PO intake has been very poor. She has not indicated pain in her chest, abdomen, or back. No complaints of headache. She is essentially wheelchair bound, and she has pressure ulcers on her sacrum and just below her buttock.  CT head negative for acute findings.  Chest xray is showing poorly defined opacity in the left lung base, atelectasis vs infiltrate.        SLP Plan  Continue with current  plan of care       Recommendations  Diet recommendations: Dysphagia 3 (mechanical soft);Thin liquid Liquids provided via: Cup;Straw Medication Administration: Whole meds with puree Supervision: Staff to assist with self feeding Compensations: Slow rate;Small sips/bites                Oral Care Recommendations: Oral care QID Follow up Recommendations: Other (comment) SLP Visit Diagnosis: Dysphagia, oropharyngeal phase (R13.12) Plan: Continue with current plan of care       GO               [employer], MA CCC-SLP [phone]  Lynann Beaver 10/27/2016, 1:50 PM

## 2016-10-27 NOTE — Clinical Social Work Note (Signed)
Clinical Social Work Assessment  Patient Details  Name: Darlene Murray MRN: [id number] Date of Birth: [birth year]  Date of referral:  10/27/16               Reason for consult:  Facility Placement, Discharge Planning                Permission sought to share information with:  Facility Sport and exercise psychologist, Family Supports Permission granted to share information::  Yes, Verbal Permission Granted  Name::     Tymia Streb  Agency::  [facility]  Relationship::  Husband  Contact Information:  [phone]  Housing/Transportation Living arrangements for the past 2 months:  [place] of Information:  Medical Team, Facility, Spouse Patient Interpreter Needed:  None Criminal Activity/Legal Involvement Pertinent to Current Situation/Hospitalization:  No - Comment as needed Significant Relationships:  Adult Children, Spouse Lives with:  Facility Resident Do you feel safe going back to the place where you live?  Yes Need for family participation in patient care:  Yes (Comment)  Care giving concerns:  Patient is a short-term rehab resident from [facility].   Social Worker assessment / plan:  Patient not fully oriented. CSW called patient's husband. CSW introduced role and explained that discharge planning would be discussed. Patient's husband confirmed that patient was admitted from [facility] and he would like for her to return once discharged. Per SNF staff, patient is a short-term resident so she will need a PT evaluation before she can return. No further concerns. CSW encouraged patient's husband to contact CSW as needed. CSW will continue to follow patient and her husband for support and facilitate discharge back to SNF once medically stable.  Employment status:  Retired Forensic scientist:  Medicare PT Recommendations:  Not assessed at this time [place] / Referral to community resources:  [place]  Patient/Family's Response to care:   Patient not fully oriented. Patient's husband agreeable to return to SNF. Patient's family supportive and involved in patient's care. Patient's husband appreciated social work intervention.  Patient/Family's Understanding of and Emotional Response to Diagnosis, Current Treatment, and Prognosis:  Patient not fully oriented. Patient's husband has a good understanding of the reason for admission and her need to return to rehab once discharged. Patient husband appears happy with hospital care.  Emotional Assessment Appearance:  Appears stated age Attitude/Demeanor/Rapport:  Unable to Assess Affect (typically observed):  Unable to Assess Orientation:  Oriented to Self, Oriented to Place Alcohol / Substance use:  Never Used Psych involvement (Current and /or in the community):  No (Comment)  Discharge Needs  Concerns to be addressed:  Care Coordination Readmission within the last 30 days:  Yes Current discharge risk:  Cognitively Impaired, Dependent with Mobility Barriers to Discharge:  Continued Medical Work up, Other (Awaiting PT/OT evaluations)   Candie Chroman, LCSW 10/27/2016, 11:47 AM

## 2016-10-27 NOTE — Progress Notes (Signed)
Initial Nutrition Assessment  DOCUMENTATION CODES:   Not applicable  INTERVENTION:    Strawberry Ensure Enlive po BID, each supplement provides 350 kcal and 20 grams of protein  MVI daily  NUTRITION DIAGNOSIS:   Inadequate oral intake related to poor appetite as evidenced by per patient/family report, meal completion < 50%.  GOAL:   Patient will meet greater than or equal to 90% of their needs  MONITOR:   PO intake, Supplement acceptance, Labs, Skin  REASON FOR ASSESSMENT:   Low [place], Malnutrition Screening Tool    ASSESSMENT:   83 y.o. woman with a history of multiple prior strokes, blindness in her right eye, CKD 3, severe asthma, moderate to severe protein calorie malnutrition, and dementia who is now a SNF resident.  She was transferred to the ED today for evaluation of altered mental status, increased lethargy, generalized weakness.   Nutrition-Focused physical exam completed. Findings are no fat depletion, no muscle depletion, and moderate edema.  Patient reports that she has not lost weight. Per review of usual weights in chart, patient has lost 4% of usual weight in the past month. She refused breakfast today. Agreed to receive Ensure supplements. Labs and medications reviewed.  Diet Order:  DIET DYS 3 Room service appropriate? Yes; Fluid consistency: Thin  Skin:  Wound (see comment) (stage II to sacrum; non pressure wound to R thigh)  Last BM:  4/22  Height:   Ht Readings from Last 1 Encounters:  10/26/16 5\' 4"  (1.626 m)    Weight:   Wt Readings from Last 1 Encounters:  10/26/16 156 lb 4.9 oz (70.9 kg)    Ideal Body Weight:  54.5 kg  BMI:  Body mass index is 26.83 kg/m.  Estimated Nutritional Needs:   Kcal:  1500-1700  Protein:  80-90 gm  Fluid:  1.5-1.7 L  EDUCATION NEEDS:   No education needs identified at this time  Molli Barrows, [place], [place], [place] Pager [phone] After Hours Pager [phone]

## 2016-10-27 NOTE — Care Management Note (Addendum)
Case Management Note  Patient Details  Name: Darlene Murray MRN: [id number] Date of Birth: 10/01/1921  Subjective/Objective:     From [place] , SNF, presents with Acute encephalopathy, HCAP, ckd, hx of cva, stage 2 pressure ulcer( present on admission). Plan is to return to SNF at dc.  CSW referral.               Action/Plan: NCM will follow for dc needs.   4/28 Addendum D Lizanne Erker RN CM Patient to DC to Residential SNF as facilitated by CSW.  Expected Discharge Date:                  Expected Discharge Plan:  Skilled Nursing Facility  In-House Referral:  Clinical Social Work  Discharge planning Services  CM Consult  Post Acute Care Choice:    Choice offered to:     DME Arranged:    DME Agency:     HH Arranged:    [place] Agency:     Status of Service:  Completed, signed off  If discussed at [employer], dates discussed:    Additional Comments:  Zenon Mayo, RN 10/27/2016, 4:51 PM

## 2016-10-27 NOTE — Consult Note (Signed)
[place] Nurse wound consult note Reason for Consult: sacral pressure injury Wound type: Unstageable pressure injury Pressure Injury POA: Yes Measurement:2.0cm x 3.0cm x 0.1cm  Wound bed:95% grey, non viable tissue, 5% pink at wound edges Drainage (amount, consistency, odor) minimal, no odor, not purulent Periwound: intact, small scattered areas of partial thickness skin loss, most likely to due moisture Patient has incontinent brief on when I arrived,  I have removed this.  Dressing procedure/placement/frequency: Enzymatic debridement ointment to the sacral ulcer, cover with 2x2 moist with saline. Top with dry dressing change daily.  Add pressure redistribution chair pad.  Discussed POC with patient and bedside nurse.  Re consult if needed, will not follow at this time. Thanks  Kadence Mimbs [employer], RN,CWOCN, CNS [phone])

## 2016-10-27 NOTE — Progress Notes (Signed)
PROGRESS NOTE    Jani Moronta  XTK:[id number] DOB: 09-13-1921 DOA: 10/25/2016 PCP: Gennette Pac, MD   Brief Narrative:  Darlene Murray is a 83 y.o. woman with a history of multiple prior strokes, blindness in her right eye, CKD 3, severe asthma, moderate to severe protein calorie malnutrition, and dementia who is now a SNF resident.  She was transferred to the ED today for evaluation of altered mental status, increased lethargy, generalized weakness.  Her husband and son are at bedside and provide clinical history.  Around noon today, she was found to more lethargic during lunch.  She would not open her eyes and eat.  She asked to be put in the bed around 2PM.  By dinner, her family feels that she was barely arousable and requested transfer.  No documented fever.  Family denies overt signs of aspiration with meals, though they admit that her PO intake has been very poor.  She has not indicated pain in her chest, abdomen, or back.  No complaints of headache.  She is essentially wheelchair bound, and she has pressure ulcers on her sacrum and just below her buttock.  She had a recent admission to [facility] for severe asthma attack and possible aspiration pneumonia.  ED Course: WBCcount 11.5.  Chest xray shows a LLL opacity (today's chest xray compared to one done earlier this month; reviewed by me; infiltrate appears to be acute).  Lactic acid level 0.7.  Head CT negative for acute process.  She has received Rocephin and azithromycin in the ED.  Blood cultures drawn.  She received 1L of NS.  Hospitalist asked to admit.    Assessment & Plan:   Principal Problem:   Acute encephalopathy Active Problems:   HCAP (healthcare-associated pneumonia)   Allergic rhinitis   Hyperlipidemia   Venous insufficiency   Weakness   History of mixed hemorraghic embolic stroke   Ataxia S/P CVA   CKD (chronic kidney disease), stage III   CVA (cerebral vascular accident) ([place])   History of CVA with residual  deficit   Residual cognitive deficit as late effect of stroke   Stage 3 chronic kidney disease   History of stroke   Moderate dementia without behavioral disturbance   Pressure injury of skin   Adult failure to thrive  #1 acute encephalopathy likely secondary to healthcare associated pneumonia Patient presenting with lethargy, weakness and chest x-ray concerning for possible pneumonia versus atelectasis. Patient afebrile. Patient with significant clinical improvement is alert and following commands and answering questions. Patient with a leukocytosis that is trending down. Blood cultures pending. MRSA PCR positive. Continue empiric IV vancomycin and IV cefepime, Pulmicort, Brovana, scheduled nebulizers. SLP. If blood cultures remain negative tomorrow will discontinue IV vancomycin.  #2 hyperlipidemia Continue statin.  #3 chronic kidney disease stage III At baseline. Avoid nephrotoxic agents.  #4 history of CVA Continue Plavix for secondary stroke prevention. Continue statin.  #5 stage II pressure ulcers present on admission Wound consultation pending.  #6 borderline blood pressure Patient with borderline blood pressure. Repeat blood pressure manually. Check a random cortisol level.     DVT prophylaxis: SCDs Code Status: DO NOT RESUSCITATE Family Communication: No family at bedside. Disposition Plan: Back to skilled nursing facility when medically improved and back to baseline.   Consultants:   None  Procedures:   CT head 10/25/2016  Chest x-ray 10/25/2016  Antimicrobials:  IV cefepime 10/26/2016  IV Rocephin 10/25/2016 1  IV vancomycin 4/22/ 2018  IV azithromycin 10/25/2016 1   Subjective:  Patient sleeping have a easily arousable. Alert to self and place. Answering questions appropriately. Patient denies any chest pain. No shortness of breath. Patient is not sure why she is in the hospital. Patient denies any dizziness or  lightheadedness.  Objective: Vitals:   10/27/16 0327 10/27/16 0800 10/27/16 0831 10/27/16 0900  BP:   (!) 106/54 104/62  Pulse: 71 70 71 74  Resp: 12 18 20 17   Temp: 99 F (37.2 C)  98.4 F (36.9 C)   TempSrc: Axillary  Oral   SpO2: 98% 97% 99% 97%  Weight:      Height:        Intake/Output Summary (Last 24 hours) at 10/27/16 1000 Last data filed at 10/27/16 0900  Gross per 24 hour  Intake          2628.33 ml  Output                0 ml  Net          2628.33 ml   Filed Weights   10/26/16 0300  Weight: 70.9 kg (156 lb 4.9 oz)    Examination:  General exam: Alert. Dry mucous membranes. Respiratory system: Clear to auscultation anterior lung fields. Respiratory effort normal. Cardiovascular system: S1 & S2 heard, RRR. No JVD, murmurs, rubs, gallops or clicks. No pedal edema. Gastrointestinal system: Abdomen is nondistended, soft and nontender. No organomegaly or masses felt. Normal bowel sounds heard. Central nervous system: Alert and oriented. No focal neurological deficits. Extremities: Symmetric 5 x 5 power. Skin: No rashes, lesions or ulcers Psychiatry: Judgement and insight appear fair. Mood & affect appropriate.     Data Reviewed: I have personally reviewed following labs and imaging studies  CBC:  Recent Labs Lab 10/25/16 2020 10/26/16 0051 10/27/16 0220  WBC 11.7* 11.0* 8.1  NEUTROABS  --   --  4.3  HGB 11.5* 10.9* 10.6*  HCT 34.4* 33.5* 33.1*  MCV 87.3 87.7 88.3  PLT 225 217 510   Basic Metabolic Panel:  Recent Labs Lab 10/25/16 2020 10/26/16 0051 10/27/16 0220  NA 140 140 142  K 3.7 3.5 3.6  CL 109 112* 108  CO2 22 21* 24  GLUCOSE 103* 123* 91  BUN 11 11 5*  CREATININE 0.99 0.87 0.83  CALCIUM 8.4* 7.9* 7.9*   GFR: Estimated Creatinine Clearance: 41.8 mL/min (by C-G formula based on SCr of 0.83 mg/dL). Liver Function Tests:  Recent Labs Lab 10/25/16 2020  AST 21  ALT 17  ALKPHOS 62  BILITOT 0.9  PROT 5.9*  ALBUMIN 2.3*   No  results for input(s): LIPASE, AMYLASE in the last 168 hours. No results for input(s): AMMONIA in the last 168 hours. Coagulation Profile: No results for input(s): INR, PROTIME in the last 168 hours. Cardiac Enzymes: No results for input(s): CKTOTAL, CKMB, CKMBINDEX, TROPONINI in the last 168 hours. BNP (last 3 results)  Recent Labs  05/05/16 1521  PROBNP 16.0   HbA1C: No results for input(s): HGBA1C in the last 72 hours. CBG:  Recent Labs Lab 10/25/16 1927  GLUCAP 97   Lipid Profile: No results for input(s): CHOL, HDL, LDLCALC, TRIG, CHOLHDL, LDLDIRECT in the last 72 hours. Thyroid Function Tests: No results for input(s): TSH, T4TOTAL, FREET4, T3FREE, THYROIDAB in the last 72 hours. Anemia Panel: No results for input(s): VITAMINB12, FOLATE, FERRITIN, TIBC, IRON, RETICCTPCT in the last 72 hours. Sepsis Labs:  Recent Labs Lab 10/26/16 0039  LATICACIDVEN 0.7    Recent Results (from the past 240  hour(s))  MRSA PCR Screening     Status: Abnormal   Collection Time: 10/26/16  2:48 AM  Result Value Ref Range Status   MRSA by PCR POSITIVE (A) NEGATIVE Final    Comment:        The GeneXpert MRSA Assay (FDA approved for NASAL specimens only), is one component of a comprehensive MRSA colonization surveillance program. It is not intended to diagnose MRSA infection nor to guide or monitor treatment for MRSA infections. RESULT CALLED TO, READ BACK BY AND VERIFIED WITH: [facility] RN [phone] AT [phone] [facility]          Radiology Studies: Ct Head Wo Contrast  Result Date: 10/25/2016 CLINICAL DATA:  Altered mental status.  Baseline dementia. EXAM: CT HEAD WITHOUT CONTRAST TECHNIQUE: Contiguous axial images were obtained from the base of the skull through the vertex without intravenous contrast. COMPARISON:  Head CT dated 09/11/2015 and brain MR dated 09/11/2015. FINDINGS: Brain: Diffusely enlarged ventricles and subarachnoid spaces. Patchy white matter low density in both  cerebral hemispheres. Old left alignment lacunar infarct, unchanged. Stable old bilateral cerebellar hemisphere infarcts. No intracranial hemorrhage, mass lesion or CT evidence of acute infarction. Vascular: No hyperdense vessel or unexpected calcification. Skull: Normal. Negative for fracture or focal lesion. Sinuses/Orbits: Almost completely opacified sphenoid sinus with mild improvement. Bilateral globe post lens replacement changes. Other: None. IMPRESSION: 1. No acute abnormality. 2. Stable atrophy, chronic small vessel white matter ischemic changes and old infarcts. 3. Longstanding chronic right sphenoid sinusitis with mild improvement. Electronically Signed   By: Claudie Revering M.D.   On: 10/25/2016 20:52   Dg Chest Portable 1 View  Result Date: 10/25/2016 CLINICAL DATA:  Altered mental status EXAM: PORTABLE CHEST 1 VIEW COMPARISON:  10/11/2016, 10/10/2016 FINDINGS: Low lung volumes. Vague opacity at the left lung base. Elevated left diaphragm with probable air filled bowel in the left upper quadrant. Stable cardiomediastinal silhouette with tortuous unfolded aorta. No pneumothorax. IMPRESSION: 1. Poorly defined opacity at the left lung base, could reflect atelectasis or an infiltrate. Electronically Signed   By: Donavan Foil M.D.   On: 10/25/2016 20:00        Scheduled Meds: . arformoterol  15 mcg Nebulization BID  . aspirin  300 mg Rectal Daily  . budesonide  0.5 mg Nebulization BID  . Chlorhexidine Gluconate Cloth  6 each Topical Q0600  . ipratropium-albuterol  3 mL Nebulization TID  . mupirocin ointment  1 application Nasal BID   Continuous Infusions: . ceFEPime (MAXIPIME) IV Stopped (10/27/16 0429)  . dextrose 5 % and 0.45% NaCl 100 mL/hr at 10/27/16 0900  . vancomycin Stopped (10/27/16 0043)     LOS: 1 day    Time spent: 81 minutes    Amogh Komatsu, MD [facility] Pager [phone]  If 7PM-7AM, please contact night-coverage [email or web address] Password  [email or web address] 10/27/2016, 10:00 AM

## 2016-10-27 NOTE — Evaluation (Signed)
Physical Therapy Evaluation Patient Details Name: Darlene Murray MRN: [id number] DOB: 1921/12/26 Today's Date: 10/27/2016   History of Present Illness  Darlene Murray is a 83 y.o. woman with a history of multiple prior strokes, blindness in her right eye, CKD 3, severe asthma, moderate to severe protein calorie malnutrition, and dementia who is now a SNF resident.  She was transferred to the ED today for evaluation of altered mental status, increased lethargy, generalized weakness.    Clinical Impression  Pt admitted with above. Pt near baseline per spouse and son. Pt non-ambulatory PTA. Pt functioning at maxAx2 level for all OOB mobility. Acute PT to follow to prevent further deconditioning and maximize transfer mobility.    Follow Up Recommendations SNF    Equipment Recommendations  None recommended by PT    Recommendations for Other Services       Precautions / Restrictions Precautions Precautions: Fall Precaution Comments: urinary incontience Restrictions Weight Bearing Restrictions: No      Mobility  Bed Mobility Overal bed mobility: Needs Assistance Bed Mobility: Sit to Supine       Sit to supine: Max assist;+2 for physical assistance   General bed mobility comments: pt with no initiation despite max verbal and tacile cues. Pt requires assist for both trunk and LE management  Transfers Overall transfer level: Needs assistance Equipment used:  (2 person lift with gait belt and bed pad) Transfers: Stand Pivot Transfers;Sit to/from Stand Sit to Stand: Max assist;+2 physical assistance Stand pivot transfers: Max assist;+2 physical assistance       General transfer comment: Pt stood x 1 x 8 sec with maxA x2 for wound RN to assess wound on sacrum. pt then required 2 person max A with gait belt to complete std pvt back to bed. manual assist to advance R LE to step towards bed  Ambulation/Gait                Stairs            Wheelchair Mobility     Modified Rankin (Stroke Patients Only)       Balance Overall balance assessment: Needs assistance Sitting-balance support: No upper extremity supported;Feet supported Sitting balance-Leahy Scale: Poor Sitting balance - Comments: pt required modA to maintain EOB balance, pt unable to place arms down to hold self up                                     Pertinent Vitals/Pain Pain Assessment: No/denies pain    Home Living Family/patient expects to be discharged to:: Skilled nursing facility                 Additional Comments: pt has been at [facility] since January    Prior Function Level of Independence: Needs assistance   Gait / Transfers Assistance Needed: per son and spouse, pt only transfers to/from w/c, sometimes via lift, no ambulation  ADL's / Homemaking Assistance Needed: dependent on SNF staff        Hand Dominance        Extremity/Trunk Assessment   Upper Extremity Assessment Upper Extremity Assessment: Generalized weakness RUE Deficits / Details: Ltd shoulder movement, arthritic changes and tremor in hands RUE Coordination: decreased fine motor LUE Deficits / Details: Ltd shoulder movement; arthritic changes and tremor noted in hands LUE Coordination: decreased fine motor    Lower Extremity Assessment Lower Extremity Assessment: Generalized weakness RLE Deficits /  Details: limited knee flexion LLE Deficits / Details: limited knee flexion    Cervical / Trunk Assessment Cervical / Trunk Assessment: Kyphotic  Communication   Communication: Receptive difficulties  Cognition Arousal/Alertness: Awake/alert Behavior During Therapy: WFL for tasks assessed/performed Overall Cognitive Status: History of cognitive impairments - at baseline                                 General Comments: son and spouse reports her cognition to be better than a week ago. pt not oriented to place, time or situation. reports she lives by  herself and walks to work      General Comments General comments (skin integrity, edema, etc.): sore on sacrum however did not personally see. While in room pt had large episode of urinary incontinence, dependent to clean up    Exercises     Assessment/Plan    PT Assessment Patient needs continued PT services  PT Problem List Decreased strength;Decreased range of motion;Decreased activity tolerance;Decreased balance;Decreased mobility       PT Treatment Interventions DME instruction;Gait training;Functional mobility training;Therapeutic activities;Therapeutic exercise;Balance training    PT Goals (Current goals can be found in the Care Plan section)  Acute Rehab PT Goals Patient Stated Goal: didn't state    Frequency Min 2X/week   Barriers to discharge        Co-evaluation               End of Session Equipment Utilized During Treatment: Gait belt Activity Tolerance: Patient tolerated treatment well Patient left: in bed;with call bell/phone within reach;with bed alarm set Nurse Communication: Mobility status PT Visit Diagnosis: Muscle weakness (generalized) (M62.81)    Time: 5449-2010 PT Time Calculation (min) (ACUTE ONLY): 27 min   Charges:   PT Evaluation $PT Eval Moderate Complexity: 1 Procedure PT Treatments $Therapeutic Activity: 8-22 mins   PT G Codes:        Kittie Plater, PT, DPT Pager #: [phone] Office #: [phone]   Darlene Murray M Darlene Murray 10/27/2016, 2:22 PM

## 2016-10-28 LAB — CBC WITH DIFFERENTIAL/PLATELET
BASOS ABS: 0 10*3/uL (ref 0.0–0.1)
BASOS PCT: 0 %
Eosinophils Absolute: 0.7 10*3/uL (ref 0.0–0.7)
Eosinophils Relative: 11 %
HEMATOCRIT: 32.2 % — AB (ref 36.0–46.0)
Hemoglobin: 10.7 g/dL — ABNORMAL LOW (ref 12.0–15.0)
LYMPHS PCT: 32 %
Lymphs Abs: 2 10*3/uL (ref 0.7–4.0)
MCH: 29.1 pg (ref 26.0–34.0)
MCHC: 33.2 g/dL (ref 30.0–36.0)
MCV: 87.5 fL (ref 78.0–100.0)
MONO ABS: 0.7 10*3/uL (ref 0.1–1.0)
Monocytes Relative: 10 %
NEUTROS ABS: 2.9 10*3/uL (ref 1.7–7.7)
NEUTROS PCT: 47 %
Platelets: 233 10*3/uL (ref 150–400)
RBC: 3.68 MIL/uL — AB (ref 3.87–5.11)
RDW: 16.1 % — AB (ref 11.5–15.5)
WBC: 6.2 10*3/uL (ref 4.0–10.5)

## 2016-10-28 LAB — BASIC METABOLIC PANEL
ANION GAP: 6 (ref 5–15)
BUN: <5 mg/dL — ABNORMAL LOW (ref 6–20)
CALCIUM: 7.8 mg/dL — AB (ref 8.9–10.3)
CO2: 22 mmol/L (ref 22–32)
Chloride: 112 mmol/L — ABNORMAL HIGH (ref 101–111)
Creatinine, Ser: 0.84 mg/dL (ref 0.44–1.00)
GFR, EST NON AFRICAN AMERICAN: 59 mL/min — AB (ref >60)
GLUCOSE: 81 mg/dL (ref 65–99)
POTASSIUM: 3.1 mmol/L — AB (ref 3.5–5.1)
Sodium: 140 mmol/L (ref 135–145)

## 2016-10-28 MED ORDER — MIRTAZAPINE 15 MG PO TBDP
15.0000 mg | ORAL_TABLET | Freq: Every day | ORAL | Status: DC
  Administered 2016-10-28: 15 mg via ORAL
  Filled 2016-10-28 (×2): qty 1

## 2016-10-28 MED ORDER — ASPIRIN-DIPYRIDAMOLE ER 25-200 MG PO CP12
1.0000 | ORAL_CAPSULE | Freq: Two times a day (BID) | ORAL | Status: DC
  Administered 2016-10-28 (×2): 1 via ORAL
  Filled 2016-10-28 (×5): qty 1

## 2016-10-28 MED ORDER — SACCHAROMYCES BOULARDII 250 MG PO CAPS
250.0000 mg | ORAL_CAPSULE | Freq: Two times a day (BID) | ORAL | Status: DC
  Administered 2016-10-28 (×2): 250 mg via ORAL
  Filled 2016-10-28 (×3): qty 1

## 2016-10-28 MED ORDER — CALCIUM CARBONATE-VITAMIN D 500-200 MG-UNIT PO TABS
1.0000 | ORAL_TABLET | Freq: Every day | ORAL | Status: DC
  Filled 2016-10-28 (×2): qty 1

## 2016-10-28 MED ORDER — CLOPIDOGREL BISULFATE 75 MG PO TABS
75.0000 mg | ORAL_TABLET | Freq: Every day | ORAL | Status: DC
  Administered 2016-10-28: 75 mg via ORAL
  Filled 2016-10-28 (×2): qty 1

## 2016-10-28 MED ORDER — ATORVASTATIN CALCIUM 10 MG PO TABS
10.0000 mg | ORAL_TABLET | Freq: Every evening | ORAL | Status: DC
  Administered 2016-10-28: 10 mg via ORAL
  Filled 2016-10-28: qty 1

## 2016-10-28 NOTE — Progress Notes (Addendum)
Nutrition Follow-up / Consult  DOCUMENTATION CODES:   Non-severe (moderate) malnutrition in context of chronic illness  INTERVENTION:   Continue:  Strawberry Ensure Enlive po BID, each supplement provides 350 kcal and 20 grams of protein  MVI daily  NUTRITION DIAGNOSIS:   Malnutrition (Moderate) related to chronic illness (dementia, asthma, CKD 3, strokes) as evidenced by mild depletion of muscle mass, moderate depletions of muscle mass, percent weight loss (12% weight loss in 3 months).  GOAL:   Patient will meet greater than or equal to 90% of their needs  MONITOR:   PO intake, Supplement acceptance, Labs, Skin  REASON FOR ASSESSMENT:   Low Braden, Malnutrition Screening Tool    ASSESSMENT:   83 y.o. woman with a history of multiple prior strokes, blindness in her right eye, CKD 3, severe asthma, moderate to severe protein calorie malnutrition, and dementia who is now a SNF resident.  She was transferred to the ED today for evaluation of altered mental status, increased lethargy, generalized weakness.   Received MD consult for calorie count.  Nutrition-Focused physical exam completed. Findings are no fat depletion, mild-moderate muscle depletion, and moderate edema.  Patient reports that she has not lost weight. Per chart review, patient with 12% weight loss within 3 months.  She refused all meals yesterday. Per discussion with RN, patient just doesn't feel hungry. Today with assistance, she is only consuming bites of meals (< 5% meal completion) and sips of Ensure.  RD estimates that patient is consuming < 15% of her estimated nutrition needs. No need to complete calorie count as patient is eating minimal amounts. She is not eating enough calories, protein, vitamins, and minerals to promote wound healing. Palliative Care Team has been consulted. SLP following for dysphagia. Labs reviewed: potassium 3.1(L) Medications reviewed and include Remeron, Oscal with vitamin D,  and Florastor.  Diet Order:  DIET DYS 3 Room service appropriate? Yes; Fluid consistency: Thin  Skin:  Wound (see comment) (stage II to sacrum; non pressure wound to R thigh)  Last BM:  4/22  Height:   Ht Readings from Last 1 Encounters:  10/26/16 5\' 4"  (1.626 m)    Weight:   Wt Readings from Last 1 Encounters:  10/26/16 156 lb 4.9 oz (70.9 kg)    Ideal Body Weight:  54.5 kg  BMI:  Body mass index is 26.83 kg/m.  Estimated Nutritional Needs:   Kcal:  1500-1700  Protein:  80-90 gm  Fluid:  1.5-1.7 L  EDUCATION NEEDS:   No education needs identified at this time  Molli Barrows, [place], [place], [place] Pager [phone] After Hours Pager [phone]

## 2016-10-28 NOTE — Progress Notes (Signed)
Notified Dr. Grandville Silos via telephone patient not eating, refusing meals, refusing ensure/supplements, definitely not eating enough to heal wounds. Patient eating just bites of food at most, less than or equal to about 5% of meals, even when attempts made to feed by staff. After reviewing records, it seems that this has been an ongoing issue for this patient. New orders received.

## 2016-10-28 NOTE — NC FL2 (Signed)
[place] LEVEL OF CARE SCREENING TOOL     IDENTIFICATION  Patient Name: Darlene Murray Birthdate: Feb 03, 1922 Sex: female Admission Date (Current Location): 10/25/2016  [facility] and [place] Number:  Herbalist and Address:  [facility] [place]. [facility] [place] [address] [place], [place] [address]      Provider Number: [id number]  Attending Physician Name and Address:  Eugenie Filler, MD  Relative Name and Phone Number:       Current Level of Care: Hospital Recommended Level of Care: [place] Prior Approval Number:    Date Approved/Denied:   PASRR Number: [id number]  Discharge Plan: SNF    Current Diagnoses: Patient Active Problem List   Diagnosis Date Noted  . Adult failure to thrive 10/26/2016  . HCAP (healthcare-associated pneumonia) 10/25/2016  . Acute encephalopathy 10/25/2016  . Pressure injury of skin 10/09/2016  . Severe asthma with acute exacerbation 10/08/2016  . Polypharmacy 10/02/2016  . Acute respiratory failure with hypoxia ([place]) 07/07/2016  . Influenza with respiratory manifestation 07/07/2016  . Moderate dementia without behavioral disturbance 07/07/2016  . Severe persistent asthma with exacerbation   . COPD exacerbation ([place])   . Stage 3 chronic kidney disease   . History of stroke   . Dyspnea, unspecified 05/05/2016  . Residual cognitive deficit as late effect of stroke 04/15/2016  . Neuropathic pain of shoulder 11/29/2015  . Decreased GFR   . Asthma, severe persistent   . Thalamic infarct, acute ([place]) 09/13/2015  . Right-sided lacunar infarction ([place])   . History of CVA with residual deficit   . Asthma, moderate persistent   . Left-sided weakness 09/11/2015  . CVA (cerebral vascular accident) ([place])   . Acromioclavicular joint arthritis 08/20/2015  . Acute prerenal azotemia 08/02/2015  . CKD (chronic kidney disease), stage III 08/02/2015  . Memory loss 06/15/2015  . Spondylosis, cervical, with  myelopathy 06/13/2015  . Ataxia S/P CVA 05/17/2015  . Gait disturbance, post-stroke 05/17/2015  . Urinary tract infection 04/23/2015  . Acute hemorrhagic infarction of brain ([place]) 04/10/2015  . Ataxia   . History of mixed hemorraghic embolic stroke 38/04/1750  . Weakness 04/06/2015  . Generalized weakness 04/06/2015  . Nausea with vomiting 04/06/2015  . Left ventricular diastolic dysfunction with preserved systolic function 02/58/5277  . Severe persistent asthma in adult steroid dependent   . Allergic rhinitis   . Hyperlipidemia   . Venous insufficiency   . HNP (herniated nucleus pulposus), lumbar   . Left ventricular hypertrophy     Orientation RESPIRATION BLADDER Height & Weight     Self, Place  Normal Incontinent Weight: 156 lb 4.9 oz (70.9 kg) Height:  5\' 4"  (162.6 cm)  BEHAVIORAL SYMPTOMS/MOOD NEUROLOGICAL BOWEL NUTRITION STATUS   (None)  (CVA, Moderate dementia without behavioral disturbance) Incontinent Diet (DYS 3)  AMBULATORY STATUS COMMUNICATION OF NEEDS Skin   Total Care Verbally PU Stage and Appropriate Care, Other (Comment) (Non-pressure wound: Right medial upper thigh (foam))   PU Stage 2 Dressing:  (Mid sacrum: Foam prn)                   Personal Care Assistance Level of Assistance  Bathing, Feeding, Dressing Bathing Assistance: Maximum assistance Feeding assistance: Maximum assistance Dressing Assistance: Maximum assistance     Functional Limitations Info  Sight, Hearing, Speech Sight Info: Impaired Hearing Info: Adequate Speech Info: Impaired    SPECIAL CARE FACTORS FREQUENCY  PT (By licensed PT), OT (By licensed OT), Speech therapy  PT Frequency: 5 x week OT Frequency: 5 x week     Speech Therapy Frequency: 5 x week      Contractures Contractures Info: Not present    Additional Factors Info  Code Status, Allergies, Isolation Precautions Code Status Info: DNR Allergies Info: Nsaids     Isolation Precautions Info: Contact: MRSA      Current Medications (10/28/2016):  This is the current hospital active medication list Current Facility-Administered Medications  Medication Dose Route Frequency Provider Last Rate Last Dose  . arformoterol (BROVANA) nebulizer solution 15 mcg  15 mcg Nebulization BID Lily Kocher, MD   15 mcg at 10/28/16 0750  . atorvastatin (LIPITOR) tablet 10 mg  10 mg Oral QPM Eugenie Filler, MD      . budesonide (PULMICORT) nebulizer solution 0.5 mg  0.5 mg Nebulization BID Lily Kocher, MD   0.5 mg at 10/28/16 0750  . calcium-vitamin D (OSCAL WITH D) 500-200 MG-UNIT per tablet 1 tablet  1 tablet Oral Daily Irine Seal V, MD      . ceFEPIme (MAXIPIME) 2 g in dextrose 5 % 50 mL IVPB  2 g Intravenous Q24H Laren Everts, RPH   Stopped at 10/28/16 0406  . Chlorhexidine Gluconate Cloth 2 % PADS 6 each  6 each Topical Q0600 Eugenie Filler, MD   6 each at 10/27/16 [phone]  . clopidogrel (PLAVIX) tablet 75 mg  75 mg Oral Daily Eugenie Filler, MD      . collagenase (SANTYL) ointment   Topical Daily Eugenie Filler, MD      . dipyridamole-aspirin (AGGRENOX) 200-25 MG per 12 hr capsule 1 capsule  1 capsule Oral BID Eugenie Filler, MD      . feeding supplement (ENSURE ENLIVE) (ENSURE ENLIVE) liquid 237 mL  237 mL Oral BID BM Eugenie Filler, MD   237 mL at 10/27/16 1434  . ipratropium-albuterol (DUONEB) 0.5-2.5 (3) MG/3ML nebulizer solution 3 mL  3 mL Nebulization Q6H PRN Eugenie Filler, MD      . mirtazapine (REMERON SOL-TAB) disintegrating tablet 15 mg  15 mg Oral QHS Eugenie Filler, MD      . mupirocin ointment (BACTROBAN) 2 % 1 application  1 application Nasal BID Eugenie Filler, MD   1 application at 61/68/37 0107  . saccharomyces boulardii (FLORASTOR) capsule 250 mg  250 mg Oral BID Eugenie Filler, MD         Discharge Medications: Please see discharge summary for a list of discharge medications.  Relevant Imaging Results:  Relevant Lab Results:   Additional  Information SS#: [id number]  Candie Chroman, LCSW

## 2016-10-28 NOTE — Evaluation (Signed)
Occupational Therapy Evaluation and Discharge Patient Details Name: Darlene Murray MRN: [id number] DOB: [birth year] Today's Date: 10/28/2016    History of Present Illness Darlene Murray is a 83 y.o. woman with a history of multiple prior strokes, blindness in her right eye, CKD 3, severe asthma, moderate to severe protein calorie malnutrition, and dementia who is now a SNF resident.  She was transferred to the ED today for evaluation of altered mental status, increased lethargy, generalized weakness.     Clinical Impression   Pt with baseline dependence in ADL and mobility. No acute OT needs.     Follow Up Recommendations  SNF;Supervision/Assistance - 24 hour    Equipment Recommendations       Recommendations for Other Services       Precautions / Restrictions Precautions Precautions: Fall Precaution Comments: urinary incontinence Restrictions Weight Bearing Restrictions: No      Mobility Bed Mobility Overal bed mobility: Needs Assistance Bed Mobility: Rolling Rolling: Max assist         General bed mobility comments: pt reaching for rail of bed, rolled to R for assessment of skin on buttocks  Transfers                      Balance                                           ADL either performed or assessed with clinical judgement   ADL Overall ADL's : At baseline                                       General ADL Comments: assisted pt to self feed with mod to max assist using L hand     Vision   Additional Comments: maintains head turned to R, but can actively turn to midline     Perception     Praxis      Pertinent Vitals/Pain Pain Assessment: No/denies pain     Hand Dominance Right   Extremity/Trunk Assessment Upper Extremity Assessment Upper Extremity Assessment: RUE deficits/detail;LUE deficits/detail RUE Deficits / Details: generalized weakness, longstanding shoulder limitations, arthritic changes in  hand, tremor RUE Coordination: decreased fine motor;decreased gross motor LUE Deficits / Details: same as R LUE Coordination: decreased fine motor;decreased gross motor   Lower Extremity Assessment Lower Extremity Assessment: Defer to PT evaluation       Communication Communication Communication: Receptive difficulties   Cognition Arousal/Alertness: Awake/alert Behavior During Therapy: Flat affect Overall Cognitive Status: History of cognitive impairments - at baseline                                 General Comments: pt with delayed response, makes food and drink choices, follows commands with increased time, able to state she has 3 sons   General Comments       Exercises     Shoulder Instructions      Home Living Family/patient expects to be discharged to:: Skilled nursing facility                                 Additional Comments: pt has been at [facility] since January  Prior Functioning/Environment Level of Independence: Needs assistance  Gait / Transfers Assistance Needed: per son and spouse, pt only transfers to/from w/c, sometimes via lift, no ambulation ADL's / Homemaking Assistance Needed: dependent on SNF staff            OT Problem List: Decreased strength;Decreased activity tolerance;Decreased cognition;Impaired UE functional use      OT Treatment/Interventions:      OT Goals(Current goals can be found in the care plan section) Acute Rehab OT Goals Patient Stated Goal: didn't state  OT Frequency:     Barriers to D/C:            Co-evaluation              End of Session    Activity Tolerance: Patient tolerated treatment well Patient left: in bed;with call bell/phone within reach;with bed alarm set  OT Visit Diagnosis: Muscle weakness (generalized) (M62.81);Other symptoms and signs involving cognitive function                Time: 9373-4287 OT Time Calculation (min): 28 min Charges:  OT General  Charges $OT Visit: 1 Procedure OT Evaluation $OT Eval Moderate Complexity: 1 Procedure OT Treatments $Self Care/Home Management : 8-22 mins G-Codes:       Malka So 10/28/2016, 9:13 AM  [phone]

## 2016-10-28 NOTE — Progress Notes (Signed)
Patient transferred to 5W14 on monitor in the bed, husband at bedside with patient. Patient stable on arrival. Report concluded with 5W RN.

## 2016-10-28 NOTE — Progress Notes (Addendum)
PROGRESS NOTE    Darlene Murray  BJS:[id number] DOB: Aug 19, 1921 DOA: 10/25/2016 PCP: Gennette Pac, MD   Brief Narrative:  Darlene Murray is a 83 y.o. woman with a history of multiple prior strokes, blindness in her right eye, CKD 3, severe asthma, moderate to severe protein calorie malnutrition, and dementia who is now a SNF resident.  She was transferred to the ED today for evaluation of altered mental status, increased lethargy, generalized weakness.  Her husband and son are at bedside and provide clinical history.  Around noon today, she was found to more lethargic during lunch.  She would not open her eyes and eat.  She asked to be put in the bed around 2PM.  By dinner, her family feels that she was barely arousable and requested transfer.  No documented fever.  Family denies overt signs of aspiration with meals, though they admit that her PO intake has been very poor.  She has not indicated pain in her chest, abdomen, or back.  No complaints of headache.  She is essentially wheelchair bound, and she has pressure ulcers on her sacrum and just below her buttock.  She had a recent admission to [facility] for severe asthma attack and possible aspiration pneumonia.  ED Course: WBCcount 11.5.  Chest xray shows a LLL opacity (today's chest xray compared to one done earlier this month; reviewed by me; infiltrate appears to be acute).  Lactic acid level 0.7.  Head CT negative for acute process.  She has received Rocephin and azithromycin in the ED.  Blood cultures drawn.  She received 1L of NS.  Hospitalist asked to admit.    Assessment & Plan:   Principal Problem:   Acute encephalopathy Active Problems:   HCAP (healthcare-associated pneumonia)   Allergic rhinitis   Hyperlipidemia   Venous insufficiency   Weakness   History of mixed hemorraghic embolic stroke   Ataxia S/P CVA   CKD (chronic kidney disease), stage III   CVA (cerebral vascular accident) ([place])   History of CVA with residual  deficit   Residual cognitive deficit as late effect of stroke   Stage 3 chronic kidney disease   History of stroke   Moderate dementia without behavioral disturbance   Pressure injury of skin   Adult failure to thrive  #1 acute encephalopathy likely secondary to healthcare associated pneumonia Patient presenting with lethargy, weakness and chest x-ray concerning for possible pneumonia versus atelectasis. Patient afebrile. Patient with significant clinical improvement is alert and following commands and answering questions. Patient with a leukocytosis that is trending down. Blood cultures pending. MRSA PCR positive. Continue empiric IV cefepime, Pulmicort, Brovana, scheduled nebulizers. SLP. DC IV vancomycin.  #2 hyperlipidemia Continue statin.  #3 chronic kidney disease stage III At baseline. Avoid nephrotoxic agents.  #4 history of CVA Resume Plavix and Aggrenox for secondary stroke prevention. Continue statin.  #5 stage II pressure ulcers present on admission Patient has been seen by wound nurse and patient noted to have a unstageable pressure injury, continue dressing changes recommended by wound care.   #6 borderline blood pressure Patient with borderline blood pressure. Repeat blood pressure manually. Blood pressure improved during the day yesterday. Monitor closely.  #7 dementia Stable.  #9 Protein Calorie malnutrition/FTT Patient with poor oral intake eating < 50% meals. Consult dietician.     DVT prophylaxis: SCDs Code Status: DO NOT RESUSCITATE Family Communication: No family at bedside. Disposition Plan: Transfer to [place]. Back to skilled nursing facility when medically improved and back to baseline.  Consultants:   None  Procedures:   CT head 10/25/2016  Chest x-ray 10/25/2016  Antimicrobials:  IV cefepime 10/26/2016  IV Rocephin 10/25/2016 1  IV vancomycin 4/22/ 2018>>>>> 10/28/2016  IV azithromycin 10/25/2016 1   Subjective: Patient  sleeping have a easily arousable. Alert to self and place. Answering questions appropriately. Patient denies any chest pain. No shortness of breath. Patient denies any dizziness or lightheadedness.  Objective: Vitals:   10/27/16 2021 10/28/16 0019 10/28/16 0358 10/28/16 0800  BP: (!) 113/49 116/61 (!) 101/59 (!) 101/52  Pulse: 77 74 68 64  Resp: (!) 21 18 (!) 28 20  Temp: 99.2 F (37.3 C) 99 F (37.2 C) 98.5 F (36.9 C) 98.4 F (36.9 C)  TempSrc: Oral Oral Axillary Oral  SpO2:   97% 100%  Weight:      Height:        Intake/Output Summary (Last 24 hours) at 10/28/16 1000 Last data filed at 10/28/16 0600  Gross per 24 hour  Intake             2470 ml  Output                0 ml  Net             2470 ml   Filed Weights   10/26/16 0300  Weight: 70.9 kg (156 lb 4.9 oz)    Examination:  General exam: Alert. Dry mucous membranes. Respiratory system: Clear to auscultation anterior lung fields. Respiratory effort normal. Cardiovascular system: S1 & S2 heard, RRR. No JVD, murmurs, rubs, gallops or clicks. No pedal edema. Gastrointestinal system: Abdomen is nondistended, soft and nontender. No organomegaly or masses felt. Normal bowel sounds heard. Central nervous system: Alert and oriented. No focal neurological deficits. Extremities: Symmetric 5 x 5 power. Skin: No rashes, lesions or ulcers Psychiatry: Judgement and insight appear fair. Mood & affect appropriate.     Data Reviewed: I have personally reviewed following labs and imaging studies  CBC:  Recent Labs Lab 10/25/16 2020 10/26/16 0051 10/27/16 0220 10/28/16 0422  WBC 11.7* 11.0* 8.1 6.2  NEUTROABS  --   --  4.3 2.9  HGB 11.5* 10.9* 10.6* 10.7*  HCT 34.4* 33.5* 33.1* 32.2*  MCV 87.3 87.7 88.3 87.5  PLT 225 217 237 737   Basic Metabolic Panel:  Recent Labs Lab 10/25/16 2020 10/26/16 0051 10/27/16 0220 10/28/16 0422  NA 140 140 142 140  K 3.7 3.5 3.6 3.1*  CL 109 112* 108 112*  CO2 22 21* 24 22    GLUCOSE 103* 123* 91 81  BUN 11 11 5* <5*  CREATININE 0.99 0.87 0.83 0.84  CALCIUM 8.4* 7.9* 7.9* 7.8*   GFR: Estimated Creatinine Clearance: 41.3 mL/min (by C-G formula based on SCr of 0.84 mg/dL). Liver Function Tests:  Recent Labs Lab 10/25/16 2020  AST 21  ALT 17  ALKPHOS 62  BILITOT 0.9  PROT 5.9*  ALBUMIN 2.3*   No results for input(s): LIPASE, AMYLASE in the last 168 hours. No results for input(s): AMMONIA in the last 168 hours. Coagulation Profile: No results for input(s): INR, PROTIME in the last 168 hours. Cardiac Enzymes: No results for input(s): CKTOTAL, CKMB, CKMBINDEX, TROPONINI in the last 168 hours. BNP (last 3 results)  Recent Labs  05/05/16 1521  PROBNP 16.0   HbA1C: No results for input(s): HGBA1C in the last 72 hours. CBG:  Recent Labs Lab 10/25/16 1927  GLUCAP 97   Lipid Profile: No results for input(s):  CHOL, HDL, LDLCALC, TRIG, CHOLHDL, LDLDIRECT in the last 72 hours. Thyroid Function Tests: No results for input(s): TSH, T4TOTAL, FREET4, T3FREE, THYROIDAB in the last 72 hours. Anemia Panel: No results for input(s): VITAMINB12, FOLATE, FERRITIN, TIBC, IRON, RETICCTPCT in the last 72 hours. Sepsis Labs:  Recent Labs Lab 10/26/16 0039  LATICACIDVEN 0.7    Recent Results (from the past 240 hour(s))  Culture, blood (Routine X 2) w Reflex to ID Panel     Status: None (Preliminary result)   Collection Time: 10/26/16 12:40 AM  Result Value Ref Range Status   Specimen Description BLOOD RIGHT HAND  Final   Special Requests IN PEDIATRIC BOTTLE Blood Culture adequate volume  Final   Culture NO GROWTH 1 DAY  Final   Report Status PENDING  Incomplete  Culture, blood (Routine X 2) w Reflex to ID Panel     Status: None (Preliminary result)   Collection Time: 10/26/16 12:58 AM  Result Value Ref Range Status   Specimen Description BLOOD RIGHT WRIST  Final   Special Requests IN PEDIATRIC BOTTLE Blood Culture adequate volume  Final   Culture NO  GROWTH 1 DAY  Final   Report Status PENDING  Incomplete  MRSA PCR Screening     Status: Abnormal   Collection Time: 10/26/16  2:48 AM  Result Value Ref Range Status   MRSA by PCR POSITIVE (A) NEGATIVE Final    Comment:        The GeneXpert MRSA Assay (FDA approved for NASAL specimens only), is one component of a comprehensive MRSA colonization surveillance program. It is not intended to diagnose MRSA infection nor to guide or monitor treatment for MRSA infections. RESULT CALLED TO, READ BACK BY AND VERIFIED WITH: [facility] RN [phone] AT [phone] [facility]          Radiology Studies: No results found.      Scheduled Meds: . arformoterol  15 mcg Nebulization BID  . aspirin  300 mg Rectal Daily  . budesonide  0.5 mg Nebulization BID  . Chlorhexidine Gluconate Cloth  6 each Topical Q0600  . collagenase   Topical Daily  . feeding supplement (ENSURE ENLIVE)  237 mL Oral BID BM  . mupirocin ointment  1 application Nasal BID   Continuous Infusions: . ceFEPime (MAXIPIME) IV Stopped (10/28/16 0406)  . dextrose 5 % and 0.45% NaCl 75 mL/hr at 10/28/16 0825  . vancomycin Stopped (10/28/16 0202)     LOS: 2 days    Time spent: 80 minutes    THOMPSON,DANIEL, MD [facility] Pager [phone]  If 7PM-7AM, please contact night-coverage [email or web address] Password [email or web address] 10/28/2016, 10:00 AM

## 2016-10-28 NOTE — Plan of Care (Signed)
Problem: Nutrition, Less Than Body Requirements Goal: Patient's nutritional intake is adequate Outcome: Not Progressing New dietitian consult

## 2016-10-29 DIAGNOSIS — R531 Weakness: Secondary | ICD-10-CM

## 2016-10-29 DIAGNOSIS — I639 Cerebral infarction, unspecified: Secondary | ICD-10-CM

## 2016-10-29 DIAGNOSIS — Z66 Do not resuscitate: Secondary | ICD-10-CM

## 2016-10-29 DIAGNOSIS — Z515 Encounter for palliative care: Secondary | ICD-10-CM

## 2016-10-29 DIAGNOSIS — G9340 Encephalopathy, unspecified: Secondary | ICD-10-CM

## 2016-10-29 DIAGNOSIS — R627 Adult failure to thrive: Secondary | ICD-10-CM

## 2016-10-29 LAB — CBC
HCT: 33.4 % — ABNORMAL LOW (ref 36.0–46.0)
Hemoglobin: 11.1 g/dL — ABNORMAL LOW (ref 12.0–15.0)
MCH: 28.8 pg (ref 26.0–34.0)
MCHC: 33.2 g/dL (ref 30.0–36.0)
MCV: 86.8 fL (ref 78.0–100.0)
PLATELETS: 234 10*3/uL (ref 150–400)
RBC: 3.85 MIL/uL — AB (ref 3.87–5.11)
RDW: 16 % — ABNORMAL HIGH (ref 11.5–15.5)
WBC: 6.2 10*3/uL (ref 4.0–10.5)

## 2016-10-29 LAB — BASIC METABOLIC PANEL
Anion gap: 8 (ref 5–15)
CHLORIDE: 110 mmol/L (ref 101–111)
CO2: 22 mmol/L (ref 22–32)
Calcium: 8.1 mg/dL — ABNORMAL LOW (ref 8.9–10.3)
Creatinine, Ser: 0.74 mg/dL (ref 0.44–1.00)
GFR calc Af Amer: >60 mL/min (ref >60)
GLUCOSE: 81 mg/dL (ref 65–99)
POTASSIUM: 3.3 mmol/L — AB (ref 3.5–5.1)
Sodium: 140 mmol/L (ref 135–145)

## 2016-10-29 MED ORDER — LORAZEPAM 1 MG PO TABS: 1.0000 mg | ORAL_TABLET | ORAL | Status: DC | PRN

## 2016-10-29 MED ORDER — SODIUM CHLORIDE 0.9 % IV SOLN
30.0000 meq | Freq: Once | INTRAVENOUS | Status: AC
  Administered 2016-10-29: 30 meq via INTRAVENOUS
  Filled 2016-10-29: qty 15

## 2016-10-29 MED ORDER — POTASSIUM CHLORIDE 20 MEQ PO PACK
40.0000 meq | PACK | Freq: Once | ORAL | Status: DC
Start: 2016-10-29 — End: 2016-10-29
  Filled 2016-10-29: qty 2

## 2016-10-29 MED ORDER — MORPHINE SULFATE (CONCENTRATE) 10 MG/0.5ML PO SOLN
5.0000 mg | ORAL | Status: DC | PRN
  Administered 2016-10-30: 5 mg via ORAL
  Filled 2016-10-29: qty 0.5

## 2016-10-29 NOTE — Progress Notes (Signed)
PROGRESS NOTE    Darlene Murray  HEN:[id number] DOB: 05-25-1922 DOA: 10/25/2016 PCP: Gennette Pac, MD   Brief Narrative:  Darlene Murray is a 83 y.o. woman with a history of multiple prior strokes, blindness in her right eye, CKD 3, severe asthma, moderate to severe protein calorie malnutrition, and dementia who is now a SNF resident.  She was transferred to the ED today for evaluation of altered mental status, increased lethargy, generalized weakness.  Her husband and son are at bedside and provide clinical history.  Around noon today, she was found to more lethargic during lunch.  She would not open her eyes and eat.  She asked to be put in the bed around 2PM.  By dinner, her family feels that she was barely arousable and requested transfer.  No documented fever.  Family denies overt signs of aspiration with meals, though they admit that her PO intake has been very poor.  She has not indicated pain in her chest, abdomen, or back.  No complaints of headache.  She is essentially wheelchair bound, and she has pressure ulcers on her sacrum and just below her buttock.  She had a recent admission to [facility] for severe asthma attack and possible aspiration pneumonia.  ED Course: WBCcount 11.5.  Chest xray shows a LLL opacity (today's chest xray compared to one done earlier this month; reviewed by me; infiltrate appears to be acute).  Lactic acid level 0.7.  Head CT negative for acute process.  Patient was admitted and being treated for probable pneumonia. Patient placed empirically on IV vancomycin and IV cefepime to cover for healthcare associated pneumonia as patient from a nursing facility. Patient also placed on supportive care. Patient improving clinically. Patient however refusing oral medications-at still on IV antibiotics. Patient with significantly poor oral intake with less than 50% of her meals. Palliative care consultation pending for goals of care.    Assessment & Plan:   Principal  Problem:   Acute encephalopathy Active Problems:   HCAP (healthcare-associated pneumonia)   Allergic rhinitis   Hyperlipidemia   Venous insufficiency   Weakness   History of mixed hemorraghic embolic stroke   Ataxia S/P CVA   CKD (chronic kidney disease), stage III   CVA (cerebral vascular accident) ([place])   History of CVA with residual deficit   Residual cognitive deficit as late effect of stroke   Stage 3 chronic kidney disease   History of stroke   Moderate dementia without behavioral disturbance   Pressure injury of skin   Adult failure to thrive  #1 acute encephalopathy likely secondary to healthcare associated pneumonia Patient presenting with lethargy, weakness and chest x-ray concerning for possible pneumonia versus atelectasis. Patient afebrile. Patient with significant clinical improvement is alert and following commands and answering questions and likely close to baseline. Patient with a leukocytosis that is trending down. Blood cultures pending. MRSA PCR positive. Continue empiric IV cefepime as patient refusing oral medications. Continue Pulmicort, Brovana, scheduled nebulizers. SLP. DC'd IV vancomycin.  #2 hyperlipidemia Continue statin.  #3 chronic kidney disease stage III At baseline. Avoid nephrotoxic agents.  #4 history of CVA Continue Plavix and Aggrenox for secondary stroke prevention. Continue statin.  #5 stage II pressure ulcers present on admission Patient has been seen by wound nurse and patient noted to have a unstageable pressure injury, continue dressing changes recommended by wound care.   #6 borderline blood pressure Patient with borderline blood pressure. Blood pressure improved. Saline lock IV fluids.   #7 dementia Stable.  #  9 Protein Calorie malnutrition/FTT Patient with poor oral intake eating < 50% meals. Consulted dietician. Palliative care consultation pending for goals of care as patient is noted to be declining and would poor oral intake  and failure to thrive in a patient with baseline dementia.     DVT prophylaxis: SCDs Code Status: DO NOT RESUSCITATE Family Communication: No family at bedside. Disposition Plan:  Back to skilled nursing facility when medically improved and back to baseline and after palliative care consultation..   Consultants:   None  Procedures:   CT head 10/25/2016  Chest x-ray 10/25/2016  Antimicrobials:  IV cefepime 10/26/2016  IV Rocephin 10/25/2016 1  IV vancomycin 4/22/ 2018>>>>> 10/28/2016  IV azithromycin 10/25/2016 1   Subjective: Patient sleeping easily arousable. Alert to self and place. Answering questions appropriately. Patient denies any chest pain. No shortness of breath. Patient denies any dizziness or lightheadedness. Patient with poor oral intake and less than 50% of her meals. Per nursing patient refusing medications this morning.  Objective: Vitals:   10/28/16 2026 10/28/16 2140 10/29/16 0655 10/29/16 0926  BP:  (!) 113/51 (!) 119/57   Pulse:  77 76   Resp:  17 18   Temp:  99 F (37.2 C) 99.1 F (37.3 C)   TempSrc:  Oral Oral   SpO2: 96% 96% 99% 96%  Weight:      Height:        Intake/Output Summary (Last 24 hours) at 10/29/16 1313 Last data filed at 10/29/16 6503  Gross per 24 hour  Intake              120 ml  Output                0 ml  Net              120 ml   Filed Weights   10/26/16 0300 10/28/16 1831  Weight: 70.9 kg (156 lb 4.9 oz) 76.5 kg (168 lb 9.4 oz)    Examination:  General exam: Alert.  Respiratory system: Clear to auscultation anterior lung fields. Respiratory effort normal. Cardiovascular system: S1 & S2 heard, RRR. No JVD, murmurs, rubs, gallops or clicks. No pedal edema. Gastrointestinal system: Abdomen is nondistended, soft and nontender. No organomegaly or masses felt. Normal bowel sounds heard. Central nervous system: Alert and oriented. No focal neurological deficits. Extremities: Symmetric 5 x 5 power. Skin: No  rashes, lesions or ulcers Psychiatry: Judgement and insight appear fair. Mood & affect appropriate.     Data Reviewed: I have personally reviewed following labs and imaging studies  CBC:  Recent Labs Lab 10/25/16 2020 10/26/16 0051 10/27/16 0220 10/28/16 0422 10/29/16 0604  WBC 11.7* 11.0* 8.1 6.2 6.2  NEUTROABS  --   --  4.3 2.9  --   HGB 11.5* 10.9* 10.6* 10.7* 11.1*  HCT 34.4* 33.5* 33.1* 32.2* 33.4*  MCV 87.3 87.7 88.3 87.5 86.8  PLT 225 217 237 233 546   Basic Metabolic Panel:  Recent Labs Lab 10/25/16 2020 10/26/16 0051 10/27/16 0220 10/28/16 0422 10/29/16 0604  NA 140 140 142 140 140  K 3.7 3.5 3.6 3.1* 3.3*  CL 109 112* 108 112* 110  CO2 22 21* 24 22 22   GLUCOSE 103* 123* 91 81 81  BUN 11 11 5* <5* <5*  CREATININE 0.99 0.87 0.83 0.84 0.74  CALCIUM 8.4* 7.9* 7.9* 7.8* 8.1*   GFR: Estimated Creatinine Clearance: 44.9 mL/min (by C-G formula based on SCr of 0.74 mg/dL). Liver Function  Tests:  Recent Labs Lab 10/25/16 2020  AST 21  ALT 17  ALKPHOS 62  BILITOT 0.9  PROT 5.9*  ALBUMIN 2.3*   No results for input(s): LIPASE, AMYLASE in the last 168 hours. No results for input(s): AMMONIA in the last 168 hours. Coagulation Profile: No results for input(s): INR, PROTIME in the last 168 hours. Cardiac Enzymes: No results for input(s): CKTOTAL, CKMB, CKMBINDEX, TROPONINI in the last 168 hours. BNP (last 3 results)  Recent Labs  05/05/16 1521  PROBNP 16.0   HbA1C: No results for input(s): HGBA1C in the last 72 hours. CBG:  Recent Labs Lab 10/25/16 1927  GLUCAP 97   Lipid Profile: No results for input(s): CHOL, HDL, LDLCALC, TRIG, CHOLHDL, LDLDIRECT in the last 72 hours. Thyroid Function Tests: No results for input(s): TSH, T4TOTAL, FREET4, T3FREE, THYROIDAB in the last 72 hours. Anemia Panel: No results for input(s): VITAMINB12, FOLATE, FERRITIN, TIBC, IRON, RETICCTPCT in the last 72 hours. Sepsis Labs:  Recent Labs Lab 10/26/16 0039    LATICACIDVEN 0.7    Recent Results (from the past 240 hour(s))  Culture, blood (Routine X 2) w Reflex to ID Panel     Status: None (Preliminary result)   Collection Time: 10/26/16 12:40 AM  Result Value Ref Range Status   Specimen Description BLOOD RIGHT HAND  Final   Special Requests IN PEDIATRIC BOTTLE Blood Culture adequate volume  Final   Culture NO GROWTH 2 DAYS  Final   Report Status PENDING  Incomplete  Culture, blood (Routine X 2) w Reflex to ID Panel     Status: None (Preliminary result)   Collection Time: 10/26/16 12:58 AM  Result Value Ref Range Status   Specimen Description BLOOD RIGHT WRIST  Final   Special Requests IN PEDIATRIC BOTTLE Blood Culture adequate volume  Final   Culture NO GROWTH 2 DAYS  Final   Report Status PENDING  Incomplete  MRSA PCR Screening     Status: Abnormal   Collection Time: 10/26/16  2:48 AM  Result Value Ref Range Status   MRSA by PCR POSITIVE (A) NEGATIVE Final    Comment:        The GeneXpert MRSA Assay (FDA approved for NASAL specimens only), is one component of a comprehensive MRSA colonization surveillance program. It is not intended to diagnose MRSA infection nor to guide or monitor treatment for MRSA infections. RESULT CALLED TO, READ BACK BY AND VERIFIED WITH: [facility] RN [phone] AT [phone] [facility]          Radiology Studies: No results found.      Scheduled Meds: . arformoterol  15 mcg Nebulization BID  . budesonide  0.5 mg Nebulization BID  . Chlorhexidine Gluconate Cloth  6 each Topical Q0600  . collagenase   Topical Daily  . feeding supplement (ENSURE ENLIVE)  237 mL Oral BID BM  . mupirocin ointment  1 application Nasal BID   Continuous Infusions: . potassium chloride (KCL MULTIRUN) 30 mEq in 265 mL IVPB       LOS: 3 days    Time spent: 35 minutes    Seletha Zimmermann, MD [facility] Pager [phone]  If 7PM-7AM, please contact night-coverage [email or web address] Password [email or web address] 10/29/2016,  1:13 PM

## 2016-10-29 NOTE — Consult Note (Signed)
Consultation Note Date: 10/29/2016   Patient Name: Darlene Murray  DOB: [birth year]  MRN: [id number]  Age / Sex: 83 y.o., female  PCP: Hulan Fess, MD Referring Physician: Eugenie Filler, MD  Reason for Consultation: Establishing goals of care and Psychosocial/spiritual support  HPI/Patient Profile: 83 y.o. female  admitted on 10/25/2016 with  Past medical  history of multiple prior strokes, blindness in her right eye, CKD 3, severe asthma, moderate to severe protein calorie malnutrition, and dementia who is now a SNF resident.    She was transferred to the ED for evaluation of altered mental status, increased lethargy, generalized weakness.   Per family she has had continued physical, functional and cognitive decline over the past year., with multiple hospital admissions.Poor po intake.  Family face advanced directive decisions and anticipatory care needs    Clinical Assessment and Goals of Care:   This NP Wadie Lessen reviewed medical records, received report from team, assessed the patient and then meet at the patient's bedside along with husband and  Two sons  to discuss diagnosis, prognosis, GOC, EOL wishes disposition and options.  Discussed concept of mortality, failure to thrive and dementia as a terminal disease   Patient has always made it known to family that she did not want life prolonging measures if she found herself in exactly the current situation.   A detailed discussion was had today regarding advanced directives.  Concepts specific to code status, artifical feeding and hydration, continued IV antibiotics and rehospitalization was had.  The difference between a aggressive medical intervention path  and a palliative comfort care path for this patient at this time was had.  Values and goals of care important to patient and family were attempted to be elicited.  MOST form  introdcued  Concept of Hospice and Palliative Care were discussed  Natural trajectory and expectations at EOL were discussed.  Questions and concerns addressed.   Family encouraged to call with questions or concerns.  PMT will continue to support holistically.   HCPOA/ husband    SUMMARY OF RECOMMENDATIONS    -focus of care is comfort and dignity  Code Status/Advance Care Planning:  DNR  Symptom Management:   Pain/Dyspnea: Roxanol 5 mg po/sl every 1 hr prn  Agitation: Ativan 1 mg po/sl every 4 hrs prn  Palliative Prophylaxis:   Aspiration, Bowel Regimen, Frequent Pain Assessment and Oral Care  Additional Recommendations (Limitations, Scope, Preferences):  Full Comfort Care  Psycho-social/Spiritual:   Desire for further Chaplaincy support:no  Additional Recommendations: Education on Hospice  Prognosis:   Re-meet with husband tomorrow afternoon at 3:00 to evaluate for appropriated disposition.  Family is hopeful for a hospice facility  Discharge Planning: To Be Determined      Primary Diagnoses: Present on Admission: . HCAP (healthcare-associated pneumonia) . Acute encephalopathy . Stage 3 chronic kidney disease . Pressure injury of skin . Adult failure to thrive . Allergic rhinitis . CKD (chronic kidney disease), stage III . CVA (cerebral vascular accident) ([place]) . Hyperlipidemia . Moderate dementia without  behavioral disturbance . Venous insufficiency   I have reviewed the medical record, interviewed the patient and family, and examined the patient. The following aspects are pertinent.  Past Medical History:  Diagnosis Date  . Acute left hemiparesis ([place])   . Allergic rhinitis   . Antibiotic-associated diarrhea   . Asthma    Moderate, persistent  . Asthmatic bronchitis   . Blindness    Legally blind, right eye  . Cough   . Fibrocystic breast disease   . HNP (herniated nucleus pulposus), lumbar    L4-L5  . Hyperlipidemia   . Left  ventricular hypertrophy 2008   mild  . Physical deconditioning   . Protein calorie malnutrition ([place])   . Right sided weakness   . Right-sided lacunar infarction ([place])   . Stroke (cerebrum) ([place])   . Venous insufficiency    lower extremities   Social History   Social History  . Marital status: Married    Spouse name: N/A  . Number of children: 3  . Years of education: College   Occupational History  . Retired     Pharmacist, hospital   Social History Main Topics  . Smoking status: Passive Smoke Exposure - Never Smoker  . Smokeless tobacco: Never Used     Comment: Prior exposure through husband.   . Alcohol use No  . Drug use: No  . Sexual activity: Yes    Partners: Female   Other Topics Concern  . None   Social History Narrative   Lives at home with her husband.   Right-handed.   1 cup coffee per day.      [facility]:   Originally from [facility]. She has previously lived in [place]. Previously traveled to [place]. No pets currently. No prior bird exposure. Previously worked as a Education officer, museum. No mold or hot tub exposure.    Family History  Problem Relation Age of Onset  . Heart attack Sister   . Alzheimer's disease Sister   . Asthma Son    Scheduled Meds: . arformoterol  15 mcg Nebulization BID  . atorvastatin  10 mg Oral QPM  . budesonide  0.5 mg Nebulization BID  . calcium-vitamin D  1 tablet Oral Daily  . Chlorhexidine Gluconate Cloth  6 each Topical Q0600  . clopidogrel  75 mg Oral Daily  . collagenase   Topical Daily  . dipyridamole-aspirin  1 capsule Oral BID  . feeding supplement (ENSURE ENLIVE)  237 mL Oral BID BM  . mirtazapine  15 mg Oral QHS  . mupirocin ointment  1 application Nasal BID  . potassium chloride  40 mEq Oral Once  . saccharomyces boulardii  250 mg Oral BID   Continuous Infusions: . ceFEPime (MAXIPIME) IV Stopped (10/29/16 0654)   PRN Meds:.ipratropium-albuterol Medications Prior to Admission:  Prior to Admission medications    Medication Sig Start Date End Date Taking? Authorizing Provider  acetaminophen (TYLENOL) 500 MG tablet Take 500 mg by mouth every 8 (eight) hours as needed (pain.).    Yes Historical Provider, MD  albuterol (PROVENTIL) (2.5 MG/3ML) 0.083% nebulizer solution Take 3 mLs (2.5 mg total) by nebulization 3 (three) times daily. Patient taking differently: Take 2.5 mg by nebulization See admin instructions. Inhale 1 vial (2.5 mg) via nebulization 3 times daily for COPD, may also inhale 1 vial every 4 hours as needed for wheezing 10/13/16 10/27/16 Yes Costin Karlyne Greenspan, MD  arformoterol (BROVANA) 15 MCG/2ML NEBU Take 2 mLs (15 mcg total) by nebulization 2 (two)  times daily. 10/13/16  Yes Costin Karlyne Greenspan, MD  atorvastatin (LIPITOR) 10 MG tablet Take 10 mg by mouth every evening.    Yes Historical Provider, MD  budesonide (PULMICORT) 0.5 MG/2ML nebulizer solution Take 2 mLs (0.5 mg total) by nebulization 2 (two) times daily. 10/13/16  Yes Costin Karlyne Greenspan, MD  calcium-vitamin D (OSCAL WITH D) 500-200 MG-UNIT tablet Take 1 tablet by mouth daily.   Yes Historical Provider, MD  clopidogrel (PLAVIX) 75 MG tablet Take 1 tablet (75 mg total) by mouth daily. Patient taking differently: Take 75 mg by mouth every evening.  06/13/15  Yes Marcial Pacas, MD  collagenase (SANTYL) ointment Apply 1 application topically See admin instructions. Apply daily to sacral unstageable pressure ulcer   Yes Historical Provider, MD  dipyridamole-aspirin (AGGRENOX) 200-25 MG 12hr capsule Take 1 capsule by mouth 2 (two) times daily. 12/27/15  Yes Marcial Pacas, MD  furosemide (LASIX) 20 MG tablet Take 20 mg by mouth daily as needed for fluid or edema (weight gain of 3 lbs in 24 hours or 5 lbs in a week).    Yes Historical Provider, MD  guaiFENesin (MUCINEX) 600 MG 12 hr tablet Take 600 mg by mouth every 12 (twelve) hours as needed for cough (congestion).    Yes Historical Provider, MD  mirtazapine (REMERON) 15 MG tablet Take 1 tablet (15 mg total) by  mouth at bedtime. 10/02/16  Yes Marcial Pacas, MD  Multiple Vitamins-Minerals (DECUBI-VITE) CAPS Take 1 capsule by mouth daily.   Yes Historical Provider, MD  Multiple Vitamins-Minerals (MULTIVITAMIN WITH MINERALS) tablet Take 1 tablet by mouth daily.   Yes Historical Provider, MD  Nutritional Supplements (NUTRITIONAL SUPPLEMENT PO) Take 120 mLs by mouth daily. MedPass   Yes Historical Provider, MD  Nutritional Supplements (NUTRITIONAL SUPPLEMENT PO) Take 1 each by mouth daily. Magic Cup   Yes Historical Provider, MD  nystatin (MYCOSTATIN) 100000 UNIT/ML suspension Take 5 mLs by mouth See admin instructions. Order date 10/24/26: Swish and swallow 5 mls 4 times daily for 3 weeks for oral thrush   Yes Historical Provider, MD  nystatin (NYSTATIN) powder Apply topically See admin instructions. Apply topical to abdominal folds every shift until healed   Yes Historical Provider, MD  polyethylene glycol powder (GLYCOLAX/MIRALAX) powder Take 17 g by mouth daily. Mix with 6-8 ounces of fluid and drink   Yes Historical Provider, MD  potassium chloride SA (K-DUR,KLOR-CON) 20 MEQ tablet Take 20 mEq by mouth daily.   Yes Historical Provider, MD  predniSONE (DELTASONE) 10 MG tablet Take 1 tablet (10 mg total) by mouth daily with breakfast. 4 tablets daily x 4 days then 3 daily x 4 days then 2 daily x 4 days then 1 daily x 4 days (40) Patient taking differently: Take 10-40 mg by mouth daily with breakfast. Tapered course started 10/13/16: Take 4 tablets (40 mg) daily for 4 days, then take 3 tablets (30 mg) daily for 4 days, then take  2 tablets (20 mg) daily for 4 days, then take  1 tablet (10 mg) daily for 4 days, then resume 5 mg daily. 10/13/16  Yes Costin Karlyne Greenspan, MD  saccharomyces boulardii (FLORASTOR) 250 MG capsule Take 250 mg by mouth 2 (two) times daily.   Yes Historical Provider, MD  zinc oxide 20 % ointment Apply 1 application topically See admin instructions. Apply to sacral crease at each brief change   Yes  Historical Provider, MD  predniSONE (DELTASONE) 5 MG tablet Take 1 tablet (5 mg total) by  mouth daily with breakfast. 07/14/16   Rigoberto Noel, MD   Allergies  Allergen Reactions  . Nsaids Other (See Comments)    Unknown reaction - listed on MAR   Review of Systems  Unable to perform ROS: Dementia    Physical Exam  Constitutional: She appears lethargic. She appears cachectic.  HENT:  Mouth/Throat: Mucous membranes are dry.  Cardiovascular: Normal rate, regular rhythm and normal heart sounds.   Pulmonary/Chest: She has decreased breath sounds in the right lower field and the left lower field.  Musculoskeletal:  Generalized weakness and atrophy  Neurological: She appears lethargic.  Skin: Skin is warm and dry.    Vital Signs: BP (!) 119/57 (BP Location: Right Arm)   Pulse 76   Temp 99.1 F (37.3 C) (Oral)   Resp 18   Ht 5\' 4"  (1.626 m)   Wt 76.5 kg (168 lb 9.4 oz)   SpO2 99%   BMI 28.94 kg/m  Pain Assessment: No/denies pain POSS *See Group Information*: S-Acceptable,Sleep, easy to arouse Pain Score: 0-No pain   SpO2: SpO2: 99 % O2 Device:SpO2: 99 % O2 Flow Rate: .O2 Flow Rate (L/min): 0 L/min  IO: Intake/output summary:  Intake/Output Summary (Last 24 hours) at 10/29/16 0823 Last data filed at 10/28/16 1345  Gross per 24 hour  Intake           505.42 ml  Output                0 ml  Net           505.42 ml    LBM: Last BM Date: 10/28/16 Baseline Weight: Weight: 70.9 kg (156 lb 4.9 oz) Most recent weight: Weight: 76.5 kg (168 lb 9.4 oz)     Palliative Assessment/Data: 30 % at best   Discussed with Dr Grandville Silos  Time In: 0830 Time Out: 0945 Time Total: 75 min Greater than 50%  of this time was spent counseling and coordinating care related to the above assessment and plan.  Signed by: Wadie Lessen, NP   Please contact Palliative Medicine Team phone at [phone] for questions and concerns.  For individual provider: See Shea Evans

## 2016-10-29 NOTE — Progress Notes (Signed)
Pt refused all meds this morning and stated that she would not take them because she did not want them. Will continue to monitor.

## 2016-10-30 ENCOUNTER — Inpatient Hospital Stay: Payer: Medicare Other | Admitting: Pulmonary Disease

## 2016-10-30 DIAGNOSIS — R0609 Other forms of dyspnea: Secondary | ICD-10-CM

## 2016-10-30 MED ORDER — MORPHINE SULFATE (CONCENTRATE) 10 MG/0.5ML PO SOLN
5.0000 mg | Freq: Four times a day (QID) | ORAL | Status: DC
  Administered 2016-10-30 – 2016-11-01 (×7): 5 mg via ORAL
  Filled 2016-10-30 (×7): qty 0.5

## 2016-10-30 NOTE — Progress Notes (Signed)
Patient ID: Darlene Murray, female   DOB: 21-Dec-1921, 83 y.o.   MRN: [id number]  This NP visited patient at the bedside as a follow up to  yesterday's [place].  Husband and son are present., and one son on speaker phone.  They present today a very detailed [place] Directive; "Death is as much a reality as birth, growth, maturity and old age; however, it should not include the indignity of deterioration, dependence and hopeless pain"  Family all agree that comfort and dignity are the priority of care, "she has always made that perfectly clear"  Patient has continued to decline, she is only tolerating sips, and is more lethargic  She is requiring scheduled medications to treat her dyspnea/wheeze and underlying pain.      -Roxanol 5 mg po/sl scheduled every 6 hr    Do not hold for sedattion       - Roxanol 5 mg po/sl every 1 hr prn  Prognosis is likely less that 2 weeks.  Family hopeful for Hospice facility in [facility],  will write for SW consult  Discussed natural trajectory and expectation at EOL discussed   Questions and concerns addressed  Discussed with Dr Karleen Hampshire  Time in    1530      Time out    1630 Total time spent on the unit was 60 min  Greater than 50% of the time was spent in counseling and coordination of care  Wadie Lessen NP  Palliative Medicine Team Team Phone # [phone] Pager [phone]

## 2016-10-30 NOTE — Progress Notes (Signed)
PROGRESS NOTE    Nishi Neiswonger  SLP:[id number] DOB: Jan 03, 1922 DOA: 10/25/2016 PCP: Gennette Pac, MD   Brief Narrative:  Darlene Murray is a 83 y.o. woman with a history of multiple prior strokes, blindness in her right eye, CKD 3, severe asthma, moderate to severe protein calorie malnutrition, and dementia who is now a SNF resident.  She was transferred to the ED today for evaluation of altered mental status, increased lethargy, generalized weakness.    She is essentially wheelchair bound, and she has pressure ulcers on her sacrum and just below her buttock. She had a recent admission to [facility] for severe asthma attack and possible aspiration pneumonia.  Patient was admitted and is  being treated for probable pneumonia. Patient placed empirically on IV vancomycin and IV cefepime to cover for healthcare associated pneumonia as patient from a nursing facility. Patient also placed on supportive care.  Patient however refusing oral medications-still on IV antibiotics. Patient with significantly poor oral intake with less than 50% of her meals. Palliative care consultation for goals of care, and plan for transition to comfort care. Disposition pending at this time.     Assessment & Plan:   Principal Problem:   Acute encephalopathy Active Problems:   Allergic rhinitis   Hyperlipidemia   Venous insufficiency   Weakness   History of mixed hemorraghic embolic stroke   Ataxia S/P CVA   CKD (chronic kidney disease), stage III   CVA (cerebral vascular accident) ([place])   History of CVA with residual deficit   Residual cognitive deficit as late effect of stroke   Stage 3 chronic kidney disease   History of stroke   Moderate dementia without behavioral disturbance   Pressure injury of skin   HCAP (healthcare-associated pneumonia)   Adult failure to thrive   DNR (do not resuscitate)   Palliative care by specialist  #1 acute encephalopathy likely secondary to healthcare associated  pneumonia Patient presenting with lethargy, weakness and chest x-ray concerning for possible pneumonia versus atelectasis. Patient afebrile. Patient with significant clinical improvement is alert and following commands and answering questions and likely close to baseline.  Blood cultures pending. MRSA PCR positive. Continue empiric IV cefepime as patient refusing oral medications. Continue Pulmicort, Brovana, scheduled nebulizers.  Palliative cre consulted for goals of care, plan to transition to comfort care , and disposition pending .   #2 hyperlipidemia Continue statin.  #3 chronic kidney disease stage III At baseline. Avoid nephrotoxic agents.  #4 history of CVA Continue Plavix and Aggrenox for secondary stroke prevention. Continue statin.  #5 stage II pressure ulcers present on admission Patient has been seen by wound nurse and patient noted to have a unstageable pressure injury, continue dressing changes recommended by wound care.   #6 borderline blood pressure bp parameters are good.   #7 dementia  Stable.  #9 Protein Calorie malnutrition/FTT Patient with poor oral intake eating < 50% meals. Consulted dietician. Palliative care consultation  as patient is noted to be declining and would poor oral intake and failure to thrive in a patient with baseline dementia. Family wanted to transition to comfort care, disposition pending.   #10 Hypokalemia : replaced.      DVT prophylaxis: SCDs Code Status: DO NOT RESUSCITATE Family Communication: No family at bedside. Disposition Plan:  Pending, possibly hospice.   Consultants:   Palliative care.   Procedures:   CT head 10/25/2016  Chest x-ray 10/25/2016  Antimicrobials:  IV cefepime 10/26/2016  IV Rocephin 10/25/2016 1  IV vancomycin 4/22/  2018>>>>> 10/28/2016  IV azithromycin 10/25/2016 1   Subjective: Still very poor intake,  But awake and alert and answering simple questions.   Objective: Vitals:    10/29/16 0926 10/29/16 1510 10/29/16 2140 10/30/16 0518  BP:  136/71 (!) 107/59 (!) 112/56  Pulse:  71 87 81  Resp:  17 18 18   Temp:  97.6 F (36.4 C) 99.1 F (37.3 C) 98.5 F (36.9 C)  TempSrc:  Oral    SpO2: 96% 96% 98% 100%  Weight:      Height:        Intake/Output Summary (Last 24 hours) at 10/30/16 1032 Last data filed at 10/30/16 0552  Gross per 24 hour  Intake                0 ml  Output                0 ml  Net                0 ml   Filed Weights   10/26/16 0300 10/28/16 1831  Weight: 70.9 kg (156 lb 4.9 oz) 76.5 kg (168 lb 9.4 oz)    Examination:  General exam: Alert. Appears comfortable.  Respiratory system: Clear to auscultation anterior lung fields. Respiratory effort normal. Cardiovascular system: S1 & S2 heard, RRR. No JVD, murmurs, rubs, gallops or clicks. No pedal edema. Gastrointestinal system: Abdomen is nondistended, soft and nontender. No organomegaly or masses felt. Normal bowel sounds heard. Central nervous system: Alert and confused,  Extremities: Symmetric 5 x 5 power. Skin: unstageable pressure ulcer in the sacrum.      Data Reviewed: I have personally reviewed following labs and imaging studies  CBC:  Recent Labs Lab 10/25/16 2020 10/26/16 0051 10/27/16 0220 10/28/16 0422 10/29/16 0604  WBC 11.7* 11.0* 8.1 6.2 6.2  NEUTROABS  --   --  4.3 2.9  --   HGB 11.5* 10.9* 10.6* 10.7* 11.1*  HCT 34.4* 33.5* 33.1* 32.2* 33.4*  MCV 87.3 87.7 88.3 87.5 86.8  PLT 225 217 237 233 800   Basic Metabolic Panel:  Recent Labs Lab 10/25/16 2020 10/26/16 0051 10/27/16 0220 10/28/16 0422 10/29/16 0604  NA 140 140 142 140 140  K 3.7 3.5 3.6 3.1* 3.3*  CL 109 112* 108 112* 110  CO2 22 21* 24 22 22   GLUCOSE 103* 123* 91 81 81  BUN 11 11 5* <5* <5*  CREATININE 0.99 0.87 0.83 0.84 0.74  CALCIUM 8.4* 7.9* 7.9* 7.8* 8.1*   GFR: Estimated Creatinine Clearance: 44.9 mL/min (by C-G formula based on SCr of 0.74 mg/dL). Liver Function  Tests:  Recent Labs Lab 10/25/16 2020  AST 21  ALT 17  ALKPHOS 62  BILITOT 0.9  PROT 5.9*  ALBUMIN 2.3*   No results for input(s): LIPASE, AMYLASE in the last 168 hours. No results for input(s): AMMONIA in the last 168 hours. Coagulation Profile: No results for input(s): INR, PROTIME in the last 168 hours. Cardiac Enzymes: No results for input(s): CKTOTAL, CKMB, CKMBINDEX, TROPONINI in the last 168 hours. BNP (last 3 results)  Recent Labs  05/05/16 1521  PROBNP 16.0   HbA1C: No results for input(s): HGBA1C in the last 72 hours. CBG:  Recent Labs Lab 10/25/16 1927  GLUCAP 97   Lipid Profile: No results for input(s): CHOL, HDL, LDLCALC, TRIG, CHOLHDL, LDLDIRECT in the last 72 hours. Thyroid Function Tests: No results for input(s): TSH, T4TOTAL, FREET4, T3FREE, THYROIDAB in the last 72 hours. Anemia  Panel: No results for input(s): VITAMINB12, FOLATE, FERRITIN, TIBC, IRON, RETICCTPCT in the last 72 hours. Sepsis Labs:  Recent Labs Lab 10/26/16 0039  LATICACIDVEN 0.7    Recent Results (from the past 240 hour(s))  Culture, blood (Routine X 2) w Reflex to ID Panel     Status: None (Preliminary result)   Collection Time: 10/26/16 12:40 AM  Result Value Ref Range Status   Specimen Description BLOOD RIGHT HAND  Final   Special Requests IN PEDIATRIC BOTTLE Blood Culture adequate volume  Final   Culture NO GROWTH 3 DAYS  Final   Report Status PENDING  Incomplete  Culture, blood (Routine X 2) w Reflex to ID Panel     Status: None (Preliminary result)   Collection Time: 10/26/16 12:58 AM  Result Value Ref Range Status   Specimen Description BLOOD RIGHT WRIST  Final   Special Requests IN PEDIATRIC BOTTLE Blood Culture adequate volume  Final   Culture NO GROWTH 3 DAYS  Final   Report Status PENDING  Incomplete  MRSA PCR Screening     Status: Abnormal   Collection Time: 10/26/16  2:48 AM  Result Value Ref Range Status   MRSA by PCR POSITIVE (A) NEGATIVE Final     Comment:        The GeneXpert MRSA Assay (FDA approved for NASAL specimens only), is one component of a comprehensive MRSA colonization surveillance program. It is not intended to diagnose MRSA infection nor to guide or monitor treatment for MRSA infections. RESULT CALLED TO, READ BACK BY AND VERIFIED WITH: [facility] RN [phone] AT [phone] [facility]          Radiology Studies: No results found.      Scheduled Meds: . collagenase   Topical Daily  . mupirocin ointment  1 application Nasal BID   Continuous Infusions:    LOS: 4 days    Time spent: 9 minutes    Eric Morganti, MD [facility] Pager [phone]  If 7PM-7AM, please contact night-coverage [email or web address] Password [facility] 10/30/2016, 10:32 AM

## 2016-10-30 NOTE — Progress Notes (Signed)
Nutrition Brief Note  Chart reviewed. Pt now transitioning to comfort care.  No further nutrition interventions warranted at this time.  Please re-consult as needed.   Kentarius Partington A. Maribeth Jiles, RD, LDN, CDE Pager: [phone] After hours Pager: [phone]  

## 2016-10-31 DIAGNOSIS — I69993 Ataxia following unspecified cerebrovascular disease: Secondary | ICD-10-CM

## 2016-10-31 LAB — CULTURE, BLOOD (ROUTINE X 2)
Culture: NO GROWTH
Culture: NO GROWTH
SPECIAL REQUESTS: ADEQUATE
Special Requests: ADEQUATE

## 2016-10-31 MED ORDER — MORPHINE SULFATE (CONCENTRATE) 10 MG/0.5ML PO SOLN: 5.0000 mg | ORAL | 0 refills | Status: AC | PRN

## 2016-10-31 MED ORDER — LORAZEPAM 1 MG PO TABS: 1.0000 mg | ORAL_TABLET | ORAL | 0 refills | Status: AC | PRN

## 2016-10-31 MED ORDER — IPRATROPIUM-ALBUTEROL 0.5-2.5 (3) MG/3ML IN SOLN: 3.0000 mL | Freq: Four times a day (QID) | RESPIRATORY_TRACT | Status: AC | PRN

## 2016-10-31 MED ORDER — ONDANSETRON HCL 4 MG/2ML IJ SOLN
4.0000 mg | Freq: Three times a day (TID) | INTRAMUSCULAR | Status: DC | PRN
  Administered 2016-10-31: 4 mg via INTRAVENOUS
  Filled 2016-10-31: qty 2

## 2016-10-31 NOTE — Progress Notes (Signed)
First shift nurse Joellen Jersey has been told about pt temp during report, there is still no order yet

## 2016-10-31 NOTE — Progress Notes (Signed)
CSW received consult regarding Hospice placement. CSW sent referral to [place] [facility] in [facility] for assessment.  Percell Locus Joselynne Killam LCSWA [phone]

## 2016-10-31 NOTE — Progress Notes (Signed)
Patient refusing all food and drinks. Offered multiple different items throughout the day, even refusing ensure offered by her son. Patient's son expressing concerns over decreased intake and drowsiness today, and asking questions about her lab work and vital signs. I reiterated that the patient is on comfort care, and we were only focusing on keeping her comfortable. I stated that because she is no longer taking in any fluids, that she may be nearing the end of her life in the next few days. The son was saddened by this but stated he understood. He then started asking questions about if we've been turning her, and if her pressure sore was starting to heal. I do not believe that the family is ready to accept Darlene Murray's imminent passing. There is a family meeting with hospice scheduled for this afternoon.

## 2016-10-31 NOTE — Consult Note (Signed)
[facility]: Spoke to pt's son Deirdre Pippins. He and the family are receptive to the [facility]. He sates they are not at the hospital right now. However, they have multiple people coming in town who will want to be present for the discussion of Hospice. He states that we could meet around 400pm that everyone should be present and in town by then. Hopeful to meet and transfer pt to [facility] tomorrow if all goes well. [place] [phone]

## 2016-10-31 NOTE — Progress Notes (Signed)
Pt temp 100.4 kirby paged about it, will continue to monitor pt

## 2016-10-31 NOTE — Discharge Summary (Addendum)
Physician Discharge Summary  [name] ZDG:[id number] DOB: 13-Feb-1922 DOA: 10/25/2016  PCP: Gennette Pac, MD  Admit date: 10/25/2016 Discharge date: 11/01/2016  Admitted From: Home.  Disposition:  Residential Hospice)  Recommendations for Outpatient Follow-up:  1. Follow up with hospice MD as needed.     Discharge Condition:hospice. CODE STATUS:comfort care Diet recommendation: comfort feeds.   Brief/Interim Summary: Darlene Murray a 83 y.o.woman with a history of multiple prior strokes, blindness in her right eye, CKD 3, severe asthma, moderate to severe protein calorie malnutrition, and dementia who is now a SNF resident. She was transferred to the ED today for evaluation of altered mental status, increased lethargy, generalized weakness.  She is essentially wheelchair bound, and she has pressure ulcers on her sacrum and just below her buttock. She had a recent admission to [facility] for severe asthma attack and possible aspiration pneumonia.  Patient was admitted and is  being treated for probable pneumonia. Patient placed empirically on IV vancomycin and IV cefepime to cover for healthcare associated pneumonia as patient from a nursing facility. Patient also placed on supportive care.  Patient however refusing oral medications-still on IV antibiotics. Patient with significantly poor oral intake with less than 50% of her meals. Palliative care consultation for goals of care, and plan for transition to comfort care. Plan for residential hospice when bed available.   Discharge Diagnoses:  Principal Problem:   Acute encephalopathy Active Problems:   Allergic rhinitis   Hyperlipidemia   Venous insufficiency   Weakness   History of mixed hemorraghic embolic stroke   Ataxia S/P CVA   CKD (chronic kidney disease), stage III   CVA (cerebral vascular accident) ([place])   History of CVA with residual deficit   Residual cognitive deficit as late effect of stroke   Stage 3 chronic  kidney disease   History of stroke   Moderate dementia without behavioral disturbance   Pressure injury of skin   HCAP (healthcare-associated pneumonia)   Adult failure to thrive   DNR (do not resuscitate)   Palliative care by specialist  #1 acute encephalopathy likely secondary to healthcare associated pneumonia Patient presenting with lethargy, weakness and chest x-ray concerning for possible pneumonia versus atelectasis. Patient afebrile. Patient with significant clinical improvement is alert and following commands and answering questions and likely close to baseline.  Blood cultures pending negative so far.  MRSA PCR positive. Off antibiotics at this time.  Continue Pulmicort, Brovana, scheduled nebulizers.  Palliative care consulted for goals of care, plan to transition to comfort care , and disposition to residential hospice..   #2 hyperlipidemia Continue statin.  #3 chronic kidney disease stage III At baseline. Avoid nephrotoxic agents.  #4 history of CVA Continue Plavix and Aggrenox for secondary stroke prevention  #5 stage II pressure ulcers present on admission Patient has been seen by wound nurse and patient noted to have a unstageable pressure injury, continue dressing changes recommended by wound care.   #6 borderline blood pressure bp parameters are good.   #7 dementia  Stable.  #9 Protein Calorie malnutrition/FTT Patient with poor oral intake eating < 50% meals. Consulted dietician. Palliative care consultation  as patient is noted to be declining and has poor oral intake and failure to thrive in a patient with baseline dementia. Family wanted to transition to comfort care, disposition to residential hospice when bed available.   #10 Hypokalemia : replaced.    Discharge Instructions   Allergies as of 10/31/2016      Reactions   Nsaids  Other (See Comments)   Unknown reaction - listed on MAR     Allergies as of 11/01/2016      Reactions   Nsaids  Other (See Comments)   Unknown reaction - listed on MAR      Medication List    STOP taking these medications   DECUBI-VITE Caps   multivitamin with minerals tablet   potassium chloride SA 20 MEQ tablet Commonly known as:  K-DUR,KLOR-CON   saccharomyces boulardii 250 MG capsule Commonly known as:  FLORASTOR   zinc oxide 20 % ointment     TAKE these medications   acetaminophen 500 MG tablet Commonly known as:  TYLENOL Take 500 mg by mouth every 8 (eight) hours as needed (pain.).   albuterol (2.5 MG/3ML) 0.083% nebulizer solution Commonly known as:  PROVENTIL Take 3 mLs (2.5 mg total) by nebulization 3 (three) times daily. What changed:  when to take this  additional instructions   arformoterol 15 MCG/2ML Nebu Commonly known as:  BROVANA Take 2 mLs (15 mcg total) by nebulization 2 (two) times daily.   atorvastatin 10 MG tablet Commonly known as:  LIPITOR Take 10 mg by mouth every evening.   budesonide 0.5 MG/2ML nebulizer solution Commonly known as:  PULMICORT Take 2 mLs (0.5 mg total) by nebulization 2 (two) times daily.   calcium-vitamin D 500-200 MG-UNIT tablet Commonly known as:  OSCAL WITH D Take 1 tablet by mouth daily.   clopidogrel 75 MG tablet Commonly known as:  PLAVIX Take 1 tablet (75 mg total) by mouth daily. What changed:  when to take this   collagenase ointment Commonly known as:  SANTYL Apply 1 application topically See admin instructions. Apply daily to sacral unstageable pressure ulcer   dipyridamole-aspirin 200-25 MG 12hr capsule Commonly known as:  AGGRENOX Take 1 capsule by mouth 2 (two) times daily.   furosemide 20 MG tablet Commonly known as:  LASIX Take 20 mg by mouth daily as needed for fluid or edema (weight gain of 3 lbs in 24 hours or 5 lbs in a week).   guaiFENesin 600 MG 12 hr tablet Commonly known as:  MUCINEX Take 600 mg by mouth every 12 (twelve) hours as needed for cough (congestion).   ipratropium-albuterol  0.5-2.5 (3) MG/3ML Soln Commonly known as:  DUONEB Take 3 mLs by nebulization every 6 (six) hours as needed.   ipratropium-albuterol 0.5-2.5 (3) MG/3ML Soln Commonly known as:  DUONEB Take 3 mLs by nebulization every 6 (six) hours as needed.   LORazepam 1 MG tablet Commonly known as:  ATIVAN Take 1 tablet (1 mg total) by mouth every 4 (four) hours as needed (agitation).   mirtazapine 15 MG tablet Commonly known as:  REMERON Take 1 tablet (15 mg total) by mouth at bedtime.   morphine CONCENTRATE 10 MG/0.5ML Soln concentrated solution Take 0.25 mLs (5 mg total) by mouth every hour as needed for moderate pain or shortness of breath.   NUTRITIONAL SUPPLEMENT PO Take 120 mLs by mouth daily. MedPass   NUTRITIONAL SUPPLEMENT PO Take 1 each by mouth daily. Magic Cup   nystatin 100000 UNIT/ML suspension Commonly known as:  MYCOSTATIN Take 5 mLs by mouth See admin instructions. Order date 10/24/26: Swish and swallow 5 mls 4 times daily for 3 weeks for oral thrush   nystatin powder Generic drug:  nystatin Apply topically See admin instructions. Apply topical to abdominal folds every shift until healed   polyethylene glycol powder powder Commonly known as:  GLYCOLAX/MIRALAX Take 17 g  by mouth daily. Mix with 6-8 ounces of fluid and drink   predniSONE 5 MG tablet Commonly known as:  DELTASONE Take 1 tablet (5 mg total) by mouth daily with breakfast. What changed:  Another medication with the same name was removed. Continue taking this medication, and follow the directions you see here.         Contact information for after-discharge care    Destination    [facility] .   Specialty:  Skilled Nursing Facility Contact information: [place] [address] [phone]             Allergies  Allergen Reactions  . Nsaids Other (See Comments)    Unknown reaction - listed on MAR    Consultations:  Palliative  care.   Procedures/Studies: Ct Head Wo Contrast  Result Date: 10/25/2016 CLINICAL DATA:  Altered mental status.  Baseline dementia. EXAM: CT HEAD WITHOUT CONTRAST TECHNIQUE: Contiguous axial images were obtained from the base of the skull through the vertex without intravenous contrast. COMPARISON:  Head CT dated 09/11/2015 and brain MR dated 09/11/2015. FINDINGS: Brain: Diffusely enlarged ventricles and subarachnoid spaces. Patchy white matter low density in both cerebral hemispheres. Old left alignment lacunar infarct, unchanged. Stable old bilateral cerebellar hemisphere infarcts. No intracranial hemorrhage, mass lesion or CT evidence of acute infarction. Vascular: No hyperdense vessel or unexpected calcification. Skull: Normal. Negative for fracture or focal lesion. Sinuses/Orbits: Almost completely opacified sphenoid sinus with mild improvement. Bilateral globe post lens replacement changes. Other: None. IMPRESSION: 1. No acute abnormality. 2. Stable atrophy, chronic small vessel white matter ischemic changes and old infarcts. 3. Longstanding chronic right sphenoid sinusitis with mild improvement. Electronically Signed   By: Claudie Revering M.D.   On: 10/25/2016 20:52   Dg Chest Portable 1 View  Result Date: 10/25/2016 CLINICAL DATA:  Altered mental status EXAM: PORTABLE CHEST 1 VIEW COMPARISON:  10/11/2016, 10/10/2016 FINDINGS: Low lung volumes. Vague opacity at the left lung base. Elevated left diaphragm with probable air filled bowel in the left upper quadrant. Stable cardiomediastinal silhouette with tortuous unfolded aorta. No pneumothorax. IMPRESSION: 1. Poorly defined opacity at the left lung base, could reflect atelectasis or an infiltrate. Electronically Signed   By: Donavan Foil M.D.   On: 10/25/2016 20:00   Dg Chest Port 1 View  Result Date: 10/11/2016 CLINICAL DATA:  Aspiration pneumonia EXAM: PORTABLE CHEST 1 VIEW COMPARISON:  Yesterday FINDINGS: Normal heart size and stable aortic  tortuosity. Mild streaky density at the bases. No edema, effusion, or pneumothorax. IMPRESSION: History of aspiration pneumonia with stable mild opacification bases. Electronically Signed   By: Monte Fantasia M.D.   On: 10/11/2016 07:05   Dg Chest Port 1 View  Result Date: 10/10/2016 CLINICAL DATA:  Aspiration pneumonitis EXAM: PORTABLE CHEST 1 VIEW COMPARISON:  October 08, 2016 FINDINGS: There is subsegmental atelectasis in the left base. Lungs elsewhere are clear. Heart is upper normal in size with pulmonary vascularity within normal limits. No adenopathy. There are foci of atherosclerotic calcification in the aorta. No bone lesions. IMPRESSION: There is slight atelectasis in the left base. No consolidation. Stable cardiac silhouette. There is aortic atherosclerosis. Electronically Signed   By: Lowella Grip III M.D.   On: 10/10/2016 07:10   Dg Chest Port 1 View  Result Date: 10/08/2016 CLINICAL DATA:  83 year old with acute onset of cough, chest congestion and generalized weakness earlier today. Current history of asthma. Personal history of stroke. EXAM: PORTABLE CHEST 1 VIEW COMPARISON:  07/04/2016,  05/05/2016 and earlier. FINDINGS: Cardiac silhouette normal in size, unchanged. Thoracic aorta tortuous and mildly atherosclerotic, unchanged. Hilar and mediastinal contours otherwise unremarkable. Mild atelectasis at the right lung base. Lungs otherwise clear. Pulmonary vascularity normal. No visible pleural effusions. IMPRESSION: 1. Mild atelectasis at the right lung base. No acute cardiopulmonary disease otherwise. 2. Thoracic aortic atherosclerosis. Electronically Signed   By: Evangeline Dakin M.D.   On: 10/08/2016 18:52   Dg Swallowing Func-speech Pathology  Result Date: 10/10/2016 Objective Swallowing Evaluation: Type of Study: MBS-Modified Barium Swallow Study Patient Details Name: Doylene Splinter MRN: [id number] Date of Birth: [birth year] Today's Date: 10/10/2016 Time: SLP Start Time (ACUTE ONLY):  0815-SLP Stop Time (ACUTE ONLY): 0835 SLP Time Calculation (min) (ACUTE ONLY): 20 min Past Medical History: Past Medical History: Diagnosis Date . Acute left hemiparesis ([place])  . Allergic rhinitis  . Antibiotic-associated diarrhea  . Asthma   Moderate, persistent . Asthmatic bronchitis  . Blindness   Legally blind, right eye . Cough  . Fibrocystic breast disease  . HNP (herniated nucleus pulposus), lumbar   L4-L5 . Hyperlipidemia  . Left ventricular hypertrophy 2008  mild . Physical deconditioning  . Protein calorie malnutrition ([place])  . Right sided weakness  . Right-sided lacunar infarction ([place])  . Stroke (cerebrum) ([place])  . Venous insufficiency   lower extremities Past Surgical History: Past Surgical History: Procedure Laterality Date . CATARACT EXTRACTION   . COLONOSCOPY   HPI: 83 yo female referred for swallow evaluation by Dr Elsworth Soho.  Pt admitted with severe asthma exacerbation.  Pt PMH + for multiple CVAs, asthma.  Pt found to have right lung base ATX 10/08/16 and concern present for aspiration.   Subjective: pt awake in chair Assessment / Plan / Recommendation CHL IP CLINICAL IMPRESSIONS 10/10/2016 Clinical Impression Pt with mild oropharyngeal dysphagia resulting in premature spillage of boluses into pharynx.  Due to discoordination, pt unable to orally transit barium tablet with thin nor pudding and expectorated it per SLP cue.   Pt separated barium tablet from pudding and thin.   Pt with intermittent delay in pharyngeal swallow reflex - boluses retained in pharynx with pt stating "I swallowed".  ? some cognitive based swallowing deficits.  Cues required to swallow after approx 40 seconds of boluses retained.  No aspiration/penetration. Upon esophageal sweep, pt with appearance of residuals from distal to mid-esophagus without sensation.  Consumption of liquids faciliated clearance.  Of note, pt did NOT cough during MBS but she continued with watery eyes.  Using live video, educated pt to  findings/recommendations.   SLP Visit Diagnosis Dysphagia, oropharyngeal phase (R13.12) Attention and concentration deficit following -- Frontal lobe and executive function deficit following -- Impact on safety and function Mild aspiration risk   CHL IP TREATMENT RECOMMENDATION 10/10/2016 Treatment Recommendations Therapy as outlined in treatment plan below   Prognosis 10/10/2016 Prognosis for Safe Diet Advancement Good Barriers to Reach Goals -- Barriers/Prognosis Comment -- CHL IP DIET RECOMMENDATION 10/10/2016 SLP Diet Recommendations Dysphagia 3 (Mech soft) solids;Thin liquid Liquid Administration via Cup;Straw Medication Administration Whole meds with puree Compensations Slow rate;Small sips/bites Postural Changes --   CHL IP OTHER RECOMMENDATIONS 10/10/2016 Recommended Consults -- Oral Care Recommendations Oral care BID Other Recommendations --   CHL IP FOLLOW UP RECOMMENDATIONS 10/10/2016 Follow up Recommendations (No Data)   CHL IP FREQUENCY AND DURATION 10/10/2016 Speech Therapy Frequency (ACUTE ONLY) min 1 x/week Treatment Duration 1 week      CHL IP ORAL PHASE 10/10/2016 Oral Phase Impaired Oral - Pudding Teaspoon --  Oral - Pudding Cup -- Oral - Honey Teaspoon -- Oral - Honey Cup -- Oral - Nectar Teaspoon -- Oral - Nectar Cup Premature spillage Oral - Nectar Straw -- Oral - Thin Teaspoon Premature spillage Oral - Thin Cup Premature spillage Oral - Thin Straw Premature spillage Oral - Puree Premature spillage Oral - Mech Soft -- Oral - Regular Reduced posterior propulsion Oral - Multi-Consistency -- Oral - Pill Reduced posterior propulsion;Decreased bolus cohesion;Premature spillage Oral Phase - Comment --  CHL IP PHARYNGEAL PHASE 10/10/2016 Pharyngeal Phase Impaired Pharyngeal- Pudding Teaspoon -- Pharyngeal -- Pharyngeal- Pudding Cup -- Pharyngeal -- Pharyngeal- Honey Teaspoon -- Pharyngeal -- Pharyngeal- Honey Cup -- Pharyngeal -- Pharyngeal- Nectar Teaspoon -- Pharyngeal -- Pharyngeal- Nectar Cup Lateral channel  residue Pharyngeal -- Pharyngeal- Nectar Straw NT Pharyngeal -- Pharyngeal- Thin Teaspoon Lateral channel residue Pharyngeal -- Pharyngeal- Thin Cup Delayed swallow initiation-vallecula;Lateral channel residue Pharyngeal -- Pharyngeal- Thin Straw NT;Lateral channel residue Pharyngeal -- Pharyngeal- Puree Delayed swallow initiation-vallecula Pharyngeal -- Pharyngeal- Mechanical Soft -- Pharyngeal -- Pharyngeal- Regular -- Pharyngeal -- Pharyngeal- Multi-consistency -- Pharyngeal -- Pharyngeal- Pill NT Pharyngeal -- Pharyngeal Comment --  CHL IP CERVICAL ESOPHAGEAL PHASE 10/10/2016 Cervical Esophageal Phase WFL Pudding Teaspoon -- Pudding Cup -- Honey Teaspoon -- Honey Cup -- Nectar Teaspoon -- Nectar Cup -- Nectar Straw -- Thin Teaspoon -- Thin Cup -- Thin Straw -- Puree -- Mechanical Soft -- Regular -- Multi-consistency -- Pill -- Cervical Esophageal Comment appearance of decreased clearance of esophagus without pt sensation, providing pt with water mixed with barium faciliated clearance Luanna Salk, MS [facility] SLP [phone]                 Subjective: No complaints.   Discharge Exam: Vitals:   10/30/16 2118 10/31/16 0616  BP: 112/70 (!) 102/58  Pulse: (!) 109 (!) 109  Resp: 18 18  Temp: 98.5 F (36.9 C) (!) 100.4 F (38 C)   Vitals:   10/30/16 0518 10/30/16 1408 10/30/16 2118 10/31/16 0616  BP: (!) 112/56 128/76 112/70 (!) 102/58  Pulse: 81 85 (!) 109 (!) 109  Resp: 18 (!) 22 18 18   Temp: 98.5 F (36.9 C) 98.4 F (36.9 C) 98.5 F (36.9 C) (!) 100.4 F (38 C)  TempSrc:      SpO2: 100% 96% 94% 98%  Weight:      Height:        General: Pt is alert, awake, not in acute distress Cardiovascular: s1s2  Respiratory: scattered wheezing.  Abdominal: Soft, NT, ND, bowel sounds + Extremities: no edema, no cyanosis    The results of significant diagnostics from this hospitalization (including imaging, microbiology, ancillary and laboratory) are listed below for reference.      Microbiology: Recent Results (from the past 240 hour(s))  Culture, blood (Routine X 2) w Reflex to ID Panel     Status: None   Collection Time: 10/26/16 12:40 AM  Result Value Ref Range Status   Specimen Description BLOOD RIGHT HAND  Final   Special Requests IN PEDIATRIC BOTTLE Blood Culture adequate volume  Final   Culture NO GROWTH 5 DAYS  Final   Report Status 10/31/2016 FINAL  Final  Culture, blood (Routine X 2) w Reflex to ID Panel     Status: None   Collection Time: 10/26/16 12:58 AM  Result Value Ref Range Status   Specimen Description BLOOD RIGHT WRIST  Final   Special Requests IN PEDIATRIC BOTTLE Blood Culture adequate volume  Final   Culture NO GROWTH  5 DAYS  Final   Report Status 10/31/2016 FINAL  Final  MRSA PCR Screening     Status: Abnormal   Collection Time: 10/26/16  2:48 AM  Result Value Ref Range Status   MRSA by PCR POSITIVE (A) NEGATIVE Final    Comment:        The GeneXpert MRSA Assay (FDA approved for NASAL specimens only), is one component of a comprehensive MRSA colonization surveillance program. It is not intended to diagnose MRSA infection nor to guide or monitor treatment for MRSA infections. RESULT CALLED TO, READ BACK BY AND VERIFIED WITH: HAGERMAN,K RN [id number] AT [id number] SKEEN,P      Labs: BNP (last 3 results) No results for input(s): BNP in the last 8760 hours. Basic Metabolic Panel:  Recent Labs Lab 10/25/16 2020 10/26/16 0051 10/27/16 0220 10/28/16 0422 10/29/16 0604  NA 140 140 142 140 140  K 3.7 3.5 3.6 3.1* 3.3*  CL 109 112* 108 112* 110  CO2 22 21* 24 22 22   GLUCOSE 103* 123* 91 81 81  BUN 11 11 5* <5* <5*  CREATININE 0.99 0.87 0.83 0.84 0.74  CALCIUM 8.4* 7.9* 7.9* 7.8* 8.1*   Liver Function Tests:  Recent Labs Lab 10/25/16 2020  AST 21  ALT 17  ALKPHOS 62  BILITOT 0.9  PROT 5.9*  ALBUMIN 2.3*   No results for input(s): LIPASE, AMYLASE in the last 168 hours. No results for input(s): AMMONIA in the last 168  hours. CBC:  Recent Labs Lab 10/25/16 2020 10/26/16 0051 10/27/16 0220 10/28/16 0422 10/29/16 0604  WBC 11.7* 11.0* 8.1 6.2 6.2  NEUTROABS  --   --  4.3 2.9  --   HGB 11.5* 10.9* 10.6* 10.7* 11.1*  HCT 34.4* 33.5* 33.1* 32.2* 33.4*  MCV 87.3 87.7 88.3 87.5 86.8  PLT 225 217 237 233 234   Cardiac Enzymes: No results for input(s): CKTOTAL, CKMB, CKMBINDEX, TROPONINI in the last 168 hours. BNP: Invalid input(s): POCBNP CBG:  Recent Labs Lab 10/25/16 1927  GLUCAP 97   D-Dimer No results for input(s): DDIMER in the last 72 hours. Hgb A1c No results for input(s): HGBA1C in the last 72 hours. Lipid Profile No results for input(s): CHOL, HDL, LDLCALC, TRIG, CHOLHDL, LDLDIRECT in the last 72 hours. Thyroid function studies No results for input(s): TSH, T4TOTAL, T3FREE, THYROIDAB in the last 72 hours.  Invalid input(s): FREET3 Anemia work up No results for input(s): VITAMINB12, FOLATE, FERRITIN, TIBC, IRON, RETICCTPCT in the last 72 hours. Urinalysis    Component Value Date/Time   COLORURINE AMBER (A) 10/25/2016 2104   APPEARANCEUR CLOUDY (A) 10/25/2016 2104   LABSPEC 1.021 10/25/2016 2104   PHURINE 5.0 10/25/2016 2104   GLUCOSEU NEGATIVE 10/25/2016 2104   HGBUR NEGATIVE 10/25/2016 2104   BILIRUBINUR SMALL (A) 10/25/2016 2104   [place] NEGATIVE 10/25/2016 2104   PROTEINUR 30 (A) 10/25/2016 2104   UROBILINOGEN 1.0 04/23/2015 1106   NITRITE NEGATIVE 10/25/2016 2104   LEUKOCYTESUR NEGATIVE 10/25/2016 2104   Sepsis Labs Invalid input(s): PROCALCITONIN,  WBC,  LACTICIDVEN Microbiology Recent Results (from the past 240 hour(s))  Culture, blood (Routine X 2) w Reflex to ID Panel     Status: None   Collection Time: 10/26/16 12:40 AM  Result Value Ref Range Status   Specimen Description BLOOD RIGHT HAND  Final   Special Requests IN PEDIATRIC BOTTLE Blood Culture adequate volume  Final   Culture NO GROWTH 5 DAYS  Final   Report Status 10/31/2016 FINAL  Final  Culture,  blood (Routine X 2) w Reflex to ID Panel     Status: None   Collection Time: 10/26/16 12:58 AM  Result Value Ref Range Status   Specimen Description BLOOD RIGHT WRIST  Final   Special Requests IN PEDIATRIC BOTTLE Blood Culture adequate volume  Final   Culture NO GROWTH 5 DAYS  Final   Report Status 10/31/2016 FINAL  Final  MRSA PCR Screening     Status: Abnormal   Collection Time: 10/26/16  2:48 AM  Result Value Ref Range Status   MRSA by PCR POSITIVE (A) NEGATIVE Final    Comment:        The GeneXpert MRSA Assay (FDA approved for NASAL specimens only), is one component of a comprehensive MRSA colonization surveillance program. It is not intended to diagnose MRSA infection nor to guide or monitor treatment for MRSA infections. RESULT CALLED TO, READ BACK BY AND VERIFIED WITH: [facility] RN [id number] AT [id number] SKEEN,P      Time coordinating discharge: Over 30 minutes  SIGNED:   Hosie Poisson, MD  [facility] 10/31/2016, 3:33 PM Pager   If 7PM-7AM, please contact night-coverage [email or web address] Password [email or web address]

## 2016-11-01 MED ORDER — IPRATROPIUM-ALBUTEROL 0.5-2.5 (3) MG/3ML IN SOLN: 3.0000 mL | Freq: Four times a day (QID) | RESPIRATORY_TRACT | 1 refills | Status: AC | PRN

## 2016-11-01 NOTE — Progress Notes (Signed)
Patient will DC to: [facility] Anticipated DC date: 11/01/16 Family notified: Spouse (please call him again once PTAR arrives) Transport by: Corey Harold   Per MD patient ready for DC to [facility]. RN, patient, patient's family, and facility notified of DC. Discharge Summary sent to facility. RN given number for report [phone]). DC packet on chart. Ambulance transport requested for patient.   CSW signing off.  Cedric Fishman, [place] Social Worker [phone]

## 2016-11-01 NOTE — Progress Notes (Signed)
Darlene Murray has been transferred to the hospice per MD order.  Discussed with the family and all questions fully answered. Call placed to the hospice and report given to the nurse in charge of her care  VSS, Skin clean, dry and intact without evidence of skin break down, no evidence of skin tears noted. IV catheter discontinued intact. Site without signs and symptoms of complications. Dressing and pressure applied.  An After Visit Summary was printed and given to [facility]  Patient escorted via strecher, and transferred Hospice via ambulance.  Dorris Carnes 11/01/2016 11:03 AM

## 2016-12-05 DEATH — deceased

## 2017-03-05 IMAGING — MR MR HEAD W/O CM
9 of 11 series · 32 of 48 positions shown · non-contrast
Comparison: Prior a CT from earlier the same day.

CLINICAL DATA: Initial valuation for acute left-sided weakness.

EXAM:
MRI HEAD WITHOUT CONTRAST
TECHNIQUE: Multiplanar, multiecho pulse sequences of the brain and surrounding
structures were obtained without intravenous contrast.

[Series 3: DWI · axial · 3.6mm · 0.94mm/px · z∈[-80,+68]mm · 9 of 86 slices shown (1 of 4)]
[im 1/86]
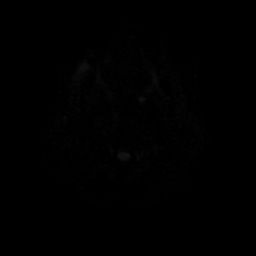
[im 11/86]
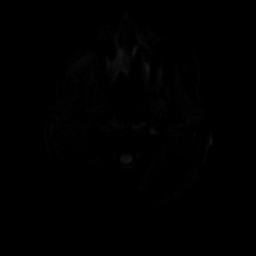
[im 22/86]
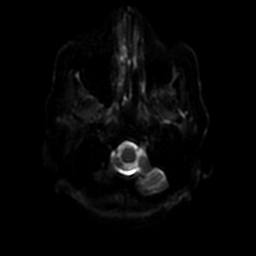
[im 32/86]
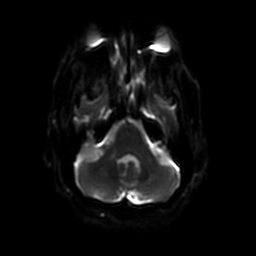
[im 43/86]
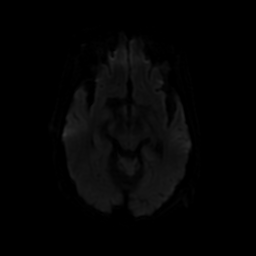
[im 54/86]
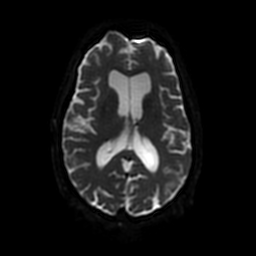
[im 64/86]
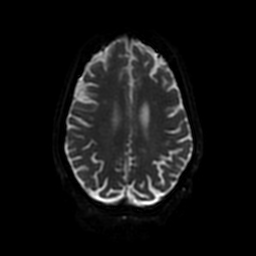
[im 75/86]
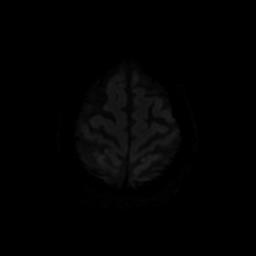
[im 86/86]
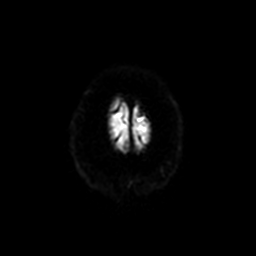

[Series 4: DWI · coronal · 5.0mm · 0.94mm/px · 6 of 64 slices shown (2 of 4)]
[im 1/64]
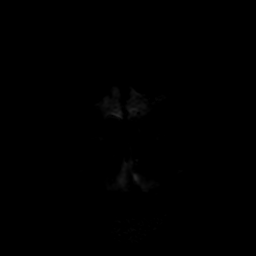
[im 13/64]
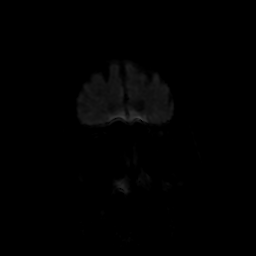
[im 26/64]
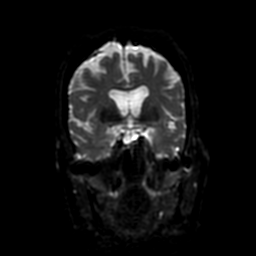
[im 38/64]
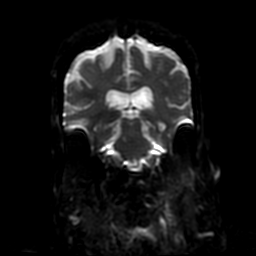
[im 51/64]
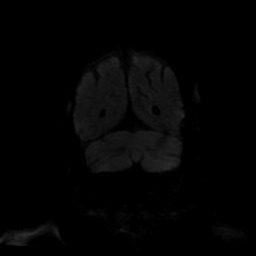
[im 64/64]
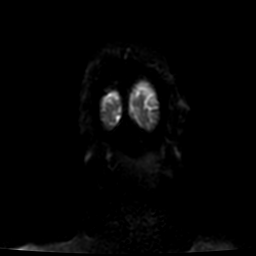

[Series 5: FLAIR · sagittal · 5.0mm · 0.47mm/px · 2 of 23 slices shown (1 of 2)]
[im 1/23]
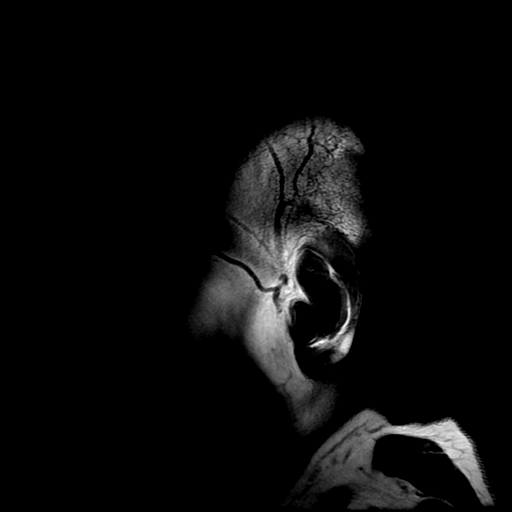
[im 23/23]
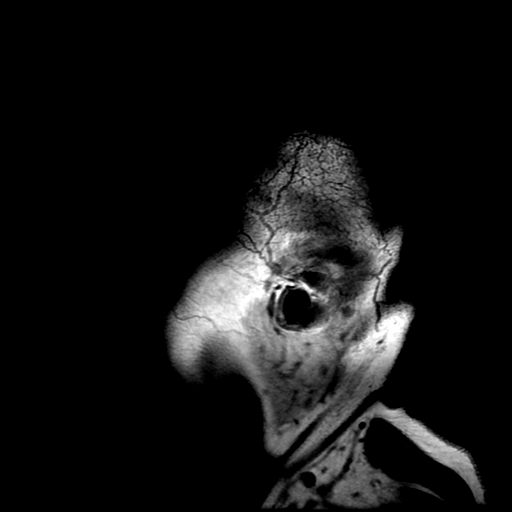

[Series 6: T2 · axial · 5.0mm · 0.47mm/px · z∈[-79,+68]mm · 2 of 26 slices shown (1 of 2)]
[im 1/26]
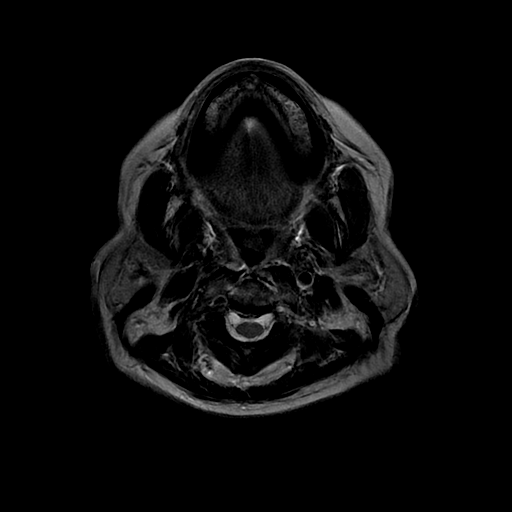
[im 26/26]
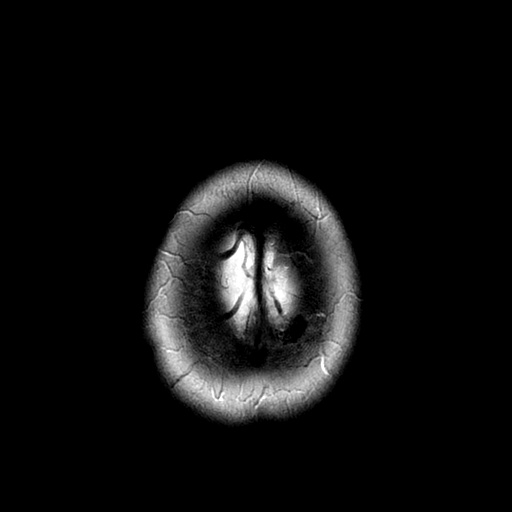

[Series 7: FLAIR · axial · 5.0mm · 0.47mm/px · z∈[-79,+68]mm · 2 of 26 slices shown (2 of 2)]
[im 1/26]
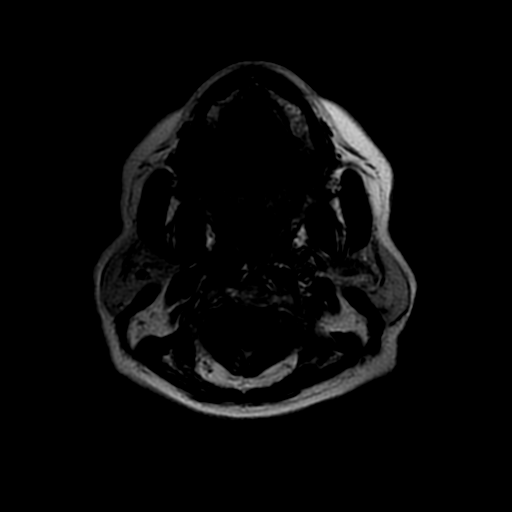
[im 26/26]
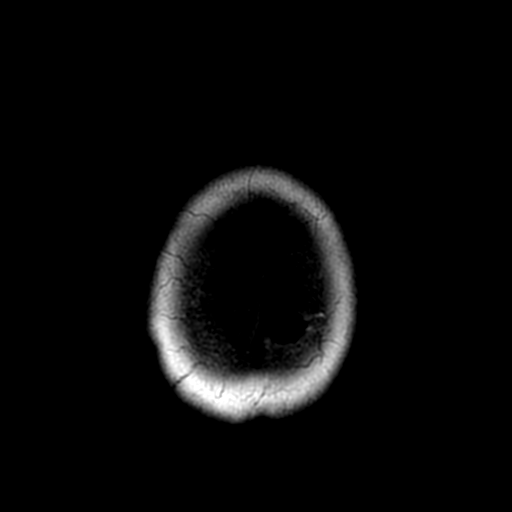

[Series 8: (person_name) · axial · 3.0mm · 0.47mm/px · 1 of 104 slices shown]
[im 1/104]
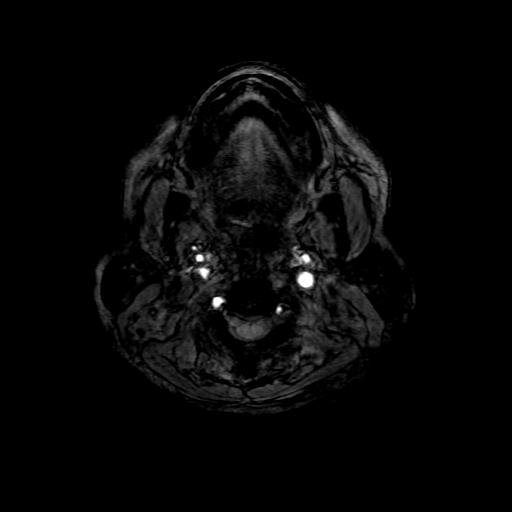

[Series 10: T2 · coronal · 5.0mm · 0.47mm/px · 3 of 27 slices shown (2 of 2)]
[im 1/27]
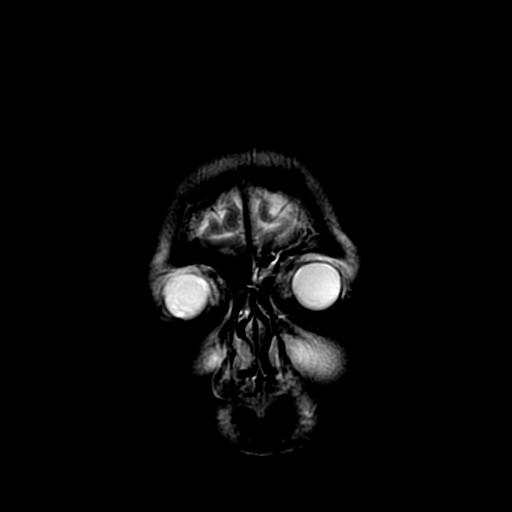
[im 14/27]
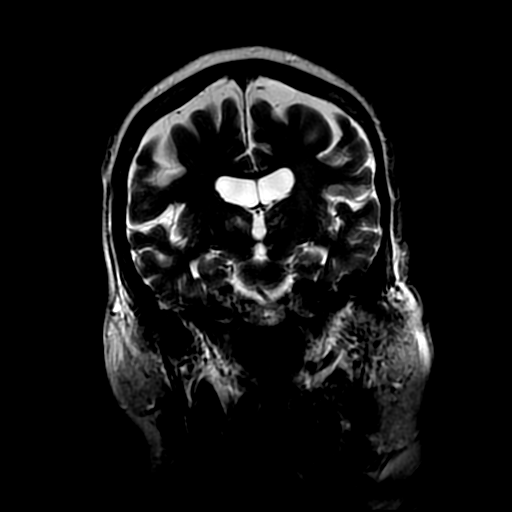
[im 27/27]
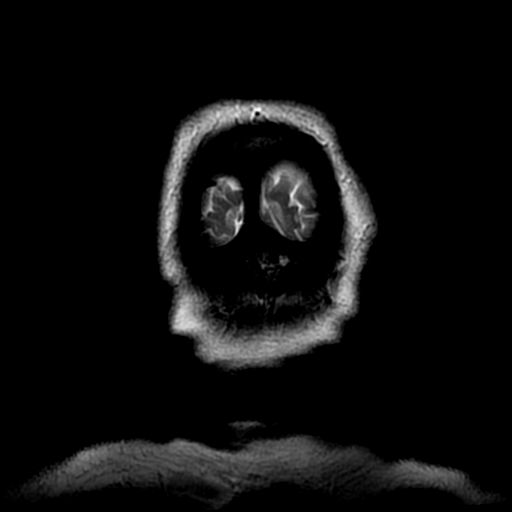

[Series 300: DWI · axial · 3.6mm · 0.94mm/px · z∈[-80,+68]mm · 4 of 43 slices shown (3 of 4)]
[im 1/43]
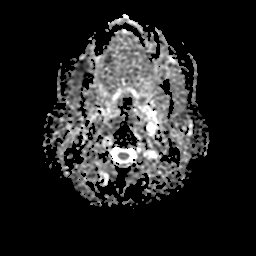
[im 15/43]
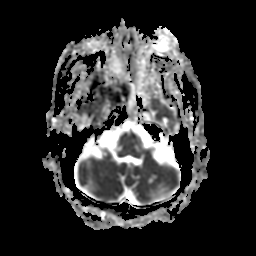
[im 29/43]
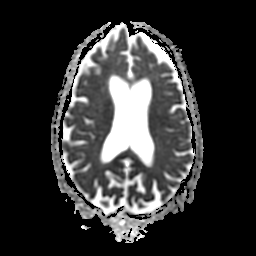
[im 43/43]
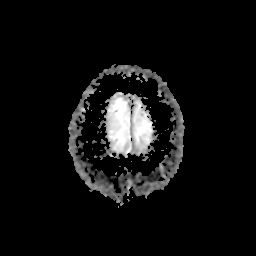

[Series 400: DWI · coronal · 5.0mm · 0.94mm/px · 3 of 32 slices shown (4 of 4)]
[im 1/32]
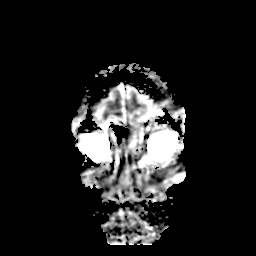
[im 16/32]
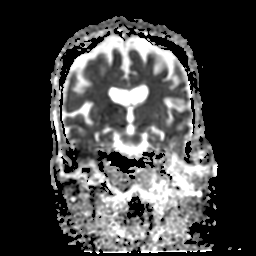
[im 32/32]
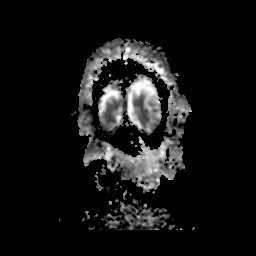

[32 of 48 positions shown; findings below may reference images not displayed]

FINDINGS: Study degraded by motion artifact.

Subtle patchy diffusion abnormality present within the parasagittal
left occipital lobe with associated T2/FLAIR hyperintensity without
significant ADC correlate, compatible with subacute infarct. There
was evidence of ischemia within this region on prior brain MRI from
06/12/2015, although the diffusion abnormality within this region is
larger in size as compared to prior study. A single small 5 mm focus
of diffusion abnormality within this region may be slightly more
recent in nature, although stool felt to be subacute (series 3,
image 23).

Tiny 6 mm diffusion abnormality within the left thalamus present,
consistent with evolving late subacute infarcts. There was evidence
ischemia within this region on prior study.

Additional patchy mildly increased diffusion abnormality within the
right middle cerebellar peduncle, left dorsal pons, and left
cerebellar peduncle also consistent with evolving subacute infarcts.
There was evidence of ischemia within these regions on prior study.

There is subtle patchy diffusion abnormality within the ventral
right thalamus without ADC correlate, also consistent with subacute
ischemia. This is new from prior study.

No other abnormal foci of restricted diffusion to suggest acute
ischemia.

Diffuse prominence of the CSF containing spaces is compatible with
generalized age-related cerebral atrophy. Patchy T2/FLAIR
hyperintensity within the periventricular white matter most
consistent with chronic small vessel ischemic disease, mild for
patient age. Multiple remote lacunar infarcts present within the
bilateral thalami, left greater than right. Multiple remote
bilateral cerebellar infarcts present. Scatter remote lacunar
infarcts involve the midbrain and pons. Progressive encephalomalacia
at the late subacute to chronic infarct at the right middle
cerebellar peduncle.

No acute intracranial hemorrhage. No mass lesion, midline shift, or
mass effect. Ventricular prominence related to global parenchymal
volume loss present without hydrocephalus. No extra-axial fluid
collection. Major intracranial vascular flow voids are maintained.
Vertebrobasilar system is diminutive.

Craniocervical junction within normal limits. Multilevel
degenerative spondylolysis present within the upper cervical spine
with mild diffuse stenosis.

Incidental note made of an empty sella. No acute abnormality about
the orbits. Sequela prior bilateral lens extraction noted. Axial
myopia on the right.

Scattered mucosal thickening throughout the paranasal sinuses. Right
sphenoid sinus is completely opacified, likely chronic. No mastoid
effusion. Inner ear structures within normal limits.

Bone marrow signal intensity within normal limits. No scalp soft
tissue abnormality.
IMPRESSION: 1. Continued interval evolution of multi focal late subacute to
chronic infarcts involving the left occipital lobe, left thalamus,
left pons, left cerebellar hemisphere, and right middle cerebellar
peduncle.
2. Subtle new patchy diffusion signal abnormality within ventral
right thalamus, consistent with subacute ischemia. While this is
subacute in nature, this is new relative to most recent MRI
06/12/2015.
[DATE]. Otherwise stable appearance of the brain with cerebral atrophy
and multiple remote infarcts as above.
